# Patient Record
Sex: Female | Born: 1955
Health system: Southern US, Community
[De-identification: ages and names within clinical notes are randomized; demographics above are authoritative.]

## PROBLEM LIST (undated history)

## (undated) DIAGNOSIS — C801 Malignant (primary) neoplasm, unspecified: Secondary | ICD-10-CM

## (undated) DIAGNOSIS — E119 Type 2 diabetes mellitus without complications: Secondary | ICD-10-CM

## (undated) DIAGNOSIS — I10 Essential (primary) hypertension: Secondary | ICD-10-CM

## (undated) HISTORY — DX: Malignant (primary) neoplasm, unspecified: C80.1

## (undated) HISTORY — DX: Essential (primary) hypertension: I10

---

## 2002-06-16 ENCOUNTER — Ambulatory Visit (HOSPITAL_COMMUNITY): Admission: RE | Admit: 2002-06-16 | Discharge: 2002-06-16 | Payer: Self-pay | Admitting: Pulmonary Disease

## 2010-06-25 ENCOUNTER — Emergency Department (HOSPITAL_COMMUNITY): Payer: PRIVATE HEALTH INSURANCE

## 2010-06-25 ENCOUNTER — Emergency Department (HOSPITAL_COMMUNITY)
Admission: EM | Admit: 2010-06-25 | Discharge: 2010-06-25 | Disposition: A | Discharge status: Home / Self Care | Payer: PRIVATE HEALTH INSURANCE | Attending: Emergency Medicine | Admitting: Emergency Medicine

## 2010-06-25 DIAGNOSIS — R079 Chest pain, unspecified: Secondary | ICD-10-CM | POA: Insufficient documentation

## 2010-06-25 DIAGNOSIS — R0602 Shortness of breath: Secondary | ICD-10-CM | POA: Insufficient documentation

## 2010-06-25 DIAGNOSIS — R51 Headache: Secondary | ICD-10-CM | POA: Insufficient documentation

## 2010-06-25 LAB — CBC
Hemoglobin: 14.4 g/dL (ref 12.0–15.0)
MCH: 30.2 pg (ref 26.0–34.0)
MCHC: 35.6 g/dL (ref 30.0–36.0)
MCV: 84.7 fL (ref 78.0–100.0)
Platelets: 240 10*3/uL (ref 150–400)
RBC: 4.77 MIL/uL (ref 3.87–5.11)
RDW: 12.2 % (ref 11.5–15.5)
WBC: 8.2 10*3/uL (ref 4.0–10.5)

## 2010-06-25 LAB — POCT CARDIAC MARKERS
CKMB, poc: <1 ng/mL — ABNORMAL LOW (ref 1.0–8.0)
Myoglobin, poc: 102 ng/mL (ref 12–200)
Troponin i, poc: <0.05 ng/mL (ref 0.00–0.09)

## 2010-06-25 LAB — DIFFERENTIAL
Basophils Absolute: 0 10*3/uL (ref 0.0–0.1)
Basophils Relative: 0 % (ref 0–1)
Eosinophils Absolute: 0.1 10*3/uL (ref 0.0–0.7)
Eosinophils Relative: 1 % (ref 0–5)
Lymphocytes Relative: 31 % (ref 12–46)
Lymphs Abs: 2.5 10*3/uL (ref 0.7–4.0)
Monocytes Absolute: 0.4 10*3/uL (ref 0.1–1.0)
Monocytes Relative: 5 % (ref 3–12)
Neutro Abs: 5.1 10*3/uL (ref 1.7–7.7)
Neutrophils Relative %: 63 % (ref 43–77)

## 2010-06-25 LAB — BASIC METABOLIC PANEL
CO2: 27 mEq/L (ref 19–32)
Calcium: 9.2 mg/dL (ref 8.4–10.5)
Chloride: 99 mEq/L (ref 96–112)
Creatinine, Ser: 0.95 mg/dL (ref 0.4–1.2)
GFR calc Af Amer: >60 mL/min (ref >60)
Glucose, Bld: 118 mg/dL — ABNORMAL HIGH (ref 70–99)
Potassium: 3.1 mEq/L — ABNORMAL LOW (ref 3.5–5.1)
Sodium: 139 mEq/L (ref 135–145)

## 2010-06-28 ENCOUNTER — Ambulatory Visit (INDEPENDENT_AMBULATORY_CARE_PROVIDER_SITE_OTHER): Payer: PRIVATE HEALTH INSURANCE | Admitting: Cardiology

## 2010-06-28 ENCOUNTER — Encounter: Payer: Self-pay | Admitting: Cardiology

## 2010-06-28 VITALS — BP 179/88 | HR 78 | Ht 64.0 in | Wt 154.0 lb

## 2010-06-28 DIAGNOSIS — R0789 Other chest pain: Secondary | ICD-10-CM

## 2010-06-28 NOTE — Patient Instructions (Signed)
**Note De-identified Lisa Thornton Obfuscation** Your physician recommends that you schedule a follow-up appointment in: as needed  

## 2010-06-28 NOTE — Progress Notes (Signed)
   Patient ID: Lisa Thornton, female    DOB: 06-19-1955, 55 y.o.   MRN: [id number]  HPI  Lisa Thornton comes today for E and M of chest pain. Her husband recently left her for her friend. She is devastated, not sleeping and very tense. ataearful in the office. Evaluated in the ED on 06/25/10. Report reviewed. Lab work, EKG, and CXR nonrevealing. Scheduled for followup with [place].  Her CP was stabbing and sharp. At one point went into her left arm. No other associated sxs. Relieved with ASA and Vicodin. No NTG given per patient.  EKG is normal today.    Review of Systems  [all other systems reviewed and are negative      Physical Exam  Constitutional: She is oriented to person, place, and time. She appears well-developed and well-nourished.  HENT:  Head: Normocephalic and atraumatic.       Poor dentition  Neck: Normal range of motion. Neck supple. No JVD present. No tracheal deviation present. No thyromegaly present.  Cardiovascular: Normal rate, regular rhythm, normal heart sounds and intact distal pulses.  Exam reveals no friction rub.   Pulmonary/Chest: Effort normal and breath sounds normal.  Abdominal: Soft. Bowel sounds are normal.  Musculoskeletal: Normal range of motion. She exhibits no edema.  Neurological: She is alert and oriented to person, place, and time.  Skin: Skin is warm and dry.  Psychiatric:       Depressed, tearful

## 2011-01-15 ENCOUNTER — Emergency Department (HOSPITAL_COMMUNITY)
Admission: EM | Admit: 2011-01-15 | Discharge: 2011-01-15 | Disposition: A | Discharge status: Home / Self Care | Payer: PRIVATE HEALTH INSURANCE | Attending: Emergency Medicine | Admitting: Emergency Medicine

## 2011-01-15 ENCOUNTER — Encounter (HOSPITAL_COMMUNITY): Payer: Self-pay | Admitting: *Deleted

## 2011-01-15 DIAGNOSIS — R51 Headache: Secondary | ICD-10-CM | POA: Insufficient documentation

## 2011-01-15 DIAGNOSIS — H1130 Conjunctival hemorrhage, unspecified eye: Secondary | ICD-10-CM | POA: Insufficient documentation

## 2011-01-15 LAB — CBC
MCH: 30.6 pg (ref 26.0–34.0)
MCHC: 35.4 g/dL (ref 30.0–36.0)
MCV: 86.5 fL (ref 78.0–100.0)
Platelets: 246 10*3/uL (ref 150–400)
RBC: 4.51 MIL/uL (ref 3.87–5.11)
RDW: 12.6 % (ref 11.5–15.5)

## 2011-01-15 MED ORDER — FLUORESCEIN SODIUM 1 MG OP STRP
1.0000 | ORAL_STRIP | Freq: Once | OPHTHALMIC | Status: AC
  Administered 2011-01-15: 1 via OPHTHALMIC
  Filled 2011-01-15: qty 1

## 2011-01-15 MED ORDER — TETRACAINE HCL 0.5 % OP SOLN
1.0000 [drp] | Freq: Once | OPHTHALMIC | Status: AC
  Administered 2011-01-15: 2 [drp] via OPHTHALMIC
  Filled 2011-01-15: qty 2

## 2011-01-15 NOTE — ED Notes (Signed)
Pt alert and oriented x 3. Skin warm and dry. Color pink. Breath sounds clear and equal bilaterally. C/o headache. Blood noted in left eye. Moves all extremities.

## 2011-01-15 NOTE — ED Provider Notes (Signed)
History  Scribed for Dr. Rubin Payor, the patient was seen in room APA05. The chart was scribed by Gilman Schmidt. The patients care was started at 1610 CSN: [id number] Arrival date & time: 01/15/2011  3:53 PM  Chief Complaint  Patient presents with  . Headache   HPI Lisa Thornton is a 55 y.o. female who presents to the Emergency Department complaining of headache. Pt states she woke up this morning with a headache and itching in left eye. She later noticed blood to corner of left eye and now states throbbing pain to eye. Reports that he is unable to see well from left eye. Blood pressure level is currently elevated. Denies any other bleeding. There are no other associated symptoms and no other alleviating or aggravating factors.   Past Medical History  Diagnosis Date  . Chest pain     History reviewed. No pertinent past surgical history.  No family history on file.  History  Substance Use Topics  . Smoking status: Never Smoker   . Smokeless tobacco: Never Used  . Alcohol Use: No    OB History    Grav Para Term Preterm Abortions TAB SAB Ect Mult Living                  Review of Systems  Eyes: Positive for pain, redness and itching.  Neurological: Positive for headaches.  All other systems reviewed and are negative.    Allergies  Review of patient's allergies indicates no known allergies.  Home Medications  No current outpatient prescriptions on file.  BP 202/85  Pulse 95  Temp(Src) 98.4 F (36.9 C) (Oral)  Resp 20  Ht 5\' 4"  (1.626 m)  Wt 160 lb (72.576 kg)  BMI 27.46 kg/m2  SpO2 98%  Physical Exam  Constitutional: She is oriented to person, place, and time. She appears well-developed and well-nourished.  Non-toxic appearance. She does not have a sickly appearance.  HENT:  Head: Normocephalic and atraumatic.  Eyes: Conjunctivae, EOM and lids are normal. Pupils are equal, round, and reactive to light. No scleral icterus.       Sub conjunctival hemorrhage to left  eye Left eye tenderness No Petechiae No bruising    Neck: Trachea normal and normal range of motion. Neck supple.  Cardiovascular: Regular rhythm and normal heart sounds.   Pulmonary/Chest: Effort normal and breath sounds normal.  Abdominal: Soft. Normal appearance. There is no tenderness. There is no rebound, no guarding and no CVA tenderness.  Musculoskeletal: Normal range of motion.  Neurological: She is alert and oriented to person, place, and time. She has normal strength.  Skin: Skin is warm, dry and intact. No rash noted.  left eye examined with slit lamp. Subconjunctival hemorrhage on the left. Intraocular pressures were around 19. Normal corneal exam.  ED Course  Procedures  DIAGNOSTIC STUDIES: Oxygen Saturation is 98% on room ain, normal by my interpretation.    COORDINATION OF CARE: 1610:  - Patient evaluated by ED physician, Pontocaine, Fluorescein strips, CBC ordered  Results for orders placed during the hospital encounter of 01/15/11  CBC      Component Value Range   WBC 9.8  4.0 - 10.5 (K/uL)   RBC 4.51  3.87 - 5.11 (MIL/uL)   Hemoglobin 13.8  12.0 - 15.0 (g/dL)   HCT 40.9  81.1 - 91.4 (%)   MCV 86.5  78.0 - 100.0 (fL)   MCH 30.6  26.0 - 34.0 (pg)   MCHC 35.4  30.0 - 36.0 (g/dL)  RDW 12.6  11.5 - 15.5 (%)   Platelets 246  150 - 400 (K/uL)      MDM  The patient presented with a sub-conjunctival hemorrhage on her left eye. It is moderate medially there is some swelling. She is able to close her eye. Intraocular pressures are upper limits of normal. No other bleeding. Her platelets are fine. She has not drank alcohol. She will need ophthalmology followup. her blood pressure has normalized on reexamination.  I personally performed the services described in this documentation, which was scribed in my presence. The recorded information has been reviewed and considered. Juliet Rude. Rubin Payor, MD       Juliet Rude. Rubin Payor, MD 01/15/11 1752

## 2011-01-15 NOTE — ED Notes (Signed)
Pt states she woke up this morning with a headache and itching to her left eye. She later noticed blood to corner of left eye and now states pain to eye, states throbbing pain to eye. Pt states she is unable to see well from left eye.

## 2013-10-04 ENCOUNTER — Encounter (HOSPITAL_COMMUNITY): Payer: Self-pay | Admitting: Emergency Medicine

## 2013-10-04 ENCOUNTER — Emergency Department (HOSPITAL_COMMUNITY)
Admission: EM | Admit: 2013-10-04 | Discharge: 2013-10-04 | Disposition: A | Discharge status: Home / Self Care | Payer: Self-pay | Attending: Emergency Medicine | Admitting: Emergency Medicine

## 2013-10-04 ENCOUNTER — Emergency Department (HOSPITAL_COMMUNITY): Payer: Self-pay

## 2013-10-04 DIAGNOSIS — S92919A Unspecified fracture of unspecified toe(s), initial encounter for closed fracture: Secondary | ICD-10-CM | POA: Insufficient documentation

## 2013-10-04 DIAGNOSIS — W2203XA Walked into furniture, initial encounter: Secondary | ICD-10-CM | POA: Insufficient documentation

## 2013-10-04 DIAGNOSIS — Z79899 Other long term (current) drug therapy: Secondary | ICD-10-CM | POA: Insufficient documentation

## 2013-10-04 DIAGNOSIS — Y92009 Unspecified place in unspecified non-institutional (private) residence as the place of occurrence of the external cause: Secondary | ICD-10-CM | POA: Insufficient documentation

## 2013-10-04 DIAGNOSIS — S92911A Unspecified fracture of right toe(s), initial encounter for closed fracture: Secondary | ICD-10-CM

## 2013-10-04 DIAGNOSIS — Y9389 Activity, other specified: Secondary | ICD-10-CM | POA: Insufficient documentation

## 2013-10-04 MED ORDER — LIDOCAINE HCL (PF) 2 % IJ SOLN
INTRAMUSCULAR | Status: AC
  Filled 2013-10-04: qty 10

## 2013-10-04 MED ORDER — HYDROCODONE-ACETAMINOPHEN 5-325 MG PO TABS
2.0000 | ORAL_TABLET | Freq: Once | ORAL | Status: AC
  Administered 2013-10-04: 2 via ORAL
  Filled 2013-10-04: qty 2

## 2013-10-04 MED ORDER — LIDOCAINE HCL (PF) 2 % IJ SOLN
10.0000 mL | Freq: Once | INTRAMUSCULAR | Status: AC
Start: 2013-10-04 — End: 2013-10-04
  Administered 2013-10-04: 10 mL

## 2013-10-04 MED ORDER — HYDROCODONE-ACETAMINOPHEN 7.5-325 MG PO TABS: 1.0000 | ORAL_TABLET | ORAL | Status: DC | PRN

## 2013-10-04 NOTE — ED Provider Notes (Signed)
CSN: [id number]     Arrival date & time 10/04/13  1649 History   First MD Initiated Contact with Patient 10/04/13 1719     Chief Complaint  Patient presents with  . Toe Injury     (Consider location/radiation/quality/duration/timing/severity/associated sxs/prior Treatment) HPI Comments: Patient is a 58 year old female who presents to the emergency department with complaint of injury to the right fifth toe. The patient states that just prior to admission she hit her right fifth toe on her bed. She noticed severe pain, and noticed deformity of the toe and came to the emergency department for evaluation. The patient denies being on any anticoagulation medications, or having any bleeding type disorders. Patient denies any previous operations or procedures involving the right foot. No other injury reported. The patient has not taken any medication for the toe injury up to this point.  The history is provided by the patient.    Past Medical History  Diagnosis Date  . Chest pain    History reviewed. No pertinent past surgical history. No family history on file. History  Substance Use Topics  . Smoking status: Never Smoker   . Smokeless tobacco: Never Used  . Alcohol Use: No   OB History   Grav Para Term Preterm Abortions TAB SAB Ect Mult Living                 Review of Systems  Constitutional: Negative for activity change.       All ROS Neg except as noted in HPI  HENT: Negative for nosebleeds.   Eyes: Negative for photophobia and discharge.  Respiratory: Negative for cough, shortness of breath and wheezing.   Cardiovascular: Negative for chest pain and palpitations.  Gastrointestinal: Negative for abdominal pain and blood in stool.  Genitourinary: Negative for dysuria, frequency and hematuria.  Musculoskeletal: Negative for arthralgias, back pain and neck pain.  Skin: Negative.   Neurological: Negative for dizziness, seizures and speech difficulty.  Psychiatric/Behavioral:  Negative for hallucinations and confusion.      Allergies  Review of patient's allergies indicates no known allergies.  Home Medications   Prior to Admission medications   Medication Sig Start Date End Date Taking? Authorizing Provider  Multiple Vitamin (MULTIVITAMIN WITH MINERALS) TABS tablet Take 1 tablet by mouth daily.   Yes Historical Provider, MD   BP 206/85  Pulse 97  Temp(Src) 98.5 F (36.9 C) (Oral)  Resp 16  Ht 5\' 4"  (1.626 m)  Wt 178 lb (80.74 kg)  BMI 30.54 kg/m2  SpO2 96% Physical Exam  Nursing note and vitals reviewed. Constitutional: She is oriented to person, place, and time. She appears well-developed and well-nourished.  Non-toxic appearance.  HENT:  Head: Normocephalic.  Right Ear: Tympanic membrane and external ear normal.  Left Ear: Tympanic membrane and external ear normal.  Eyes: EOM and lids are normal. Pupils are equal, round, and reactive to light.  Neck: Normal range of motion. Neck supple. Carotid bruit is not present.  Cardiovascular: Normal rate, regular rhythm, normal heart sounds, intact distal pulses and normal pulses.   Pulmonary/Chest: Breath sounds normal. No respiratory distress.  Abdominal: Soft. Bowel sounds are normal. There is no tenderness. There is no guarding.  Musculoskeletal: Normal range of motion.       Right foot: She exhibits bony tenderness, swelling and deformity.       Feet:  Deformity of the right 5th toe. DP 2+. Cap refill less than 2 sec.Marland Kitchen No lesions between the toes.  Lymphadenopathy:  Head (right side): No submandibular adenopathy present.       Head (left side): No submandibular adenopathy present.    She has no cervical adenopathy.  Neurological: She is alert and oriented to person, place, and time. She has normal strength. No cranial nerve deficit or sensory deficit.  Skin: Skin is warm and dry.  Psychiatric: She has a normal mood and affect. Her speech is normal.    ED Course  Reduction of  fracture Date/Time: 10/04/2013 5:53 PM Performed by: Lenox Ahr Authorized by: Lenox Ahr Consent: Verbal consent obtained. Risks and benefits: risks, benefits and alternatives were discussed Consent given by: patient Patient understanding: patient states understanding of the procedure being performed Patient identity confirmed: arm band Time out: Immediately prior to procedure a "time out" was called to verify the correct patient, procedure, equipment, support staff and site/side marked as required. Preparation: Patient was prepped and draped in the usual sterile fashion. Local anesthesia used: yes Anesthesia: digital block Local anesthetic: lidocaine 2% without epinephrine Patient sedated: no Patient tolerance: Patient tolerated the procedure well with no immediate complications. Comments: Right 5th toe fracture reduced. Buddy tape splint applied. Post reduction xray obtained.   (including critical care time) Labs Review Labs Reviewed - No data to display  Imaging Review Dg Toe 5th Right  10/04/2013   CLINICAL DATA:  Traumatic injury and pain  EXAM: RIGHT FIFTH TOE  COMPARISON:  None.  FINDINGS: There is a transverse fracture through the distal aspect of the fifth proximal phalanx with dorsal displacement of the distal fracture fragment and some mild lateral displacement as well. No other fractures are seen.  IMPRESSION: Fifth proximal phalangeal fracture as described.   Electronically Signed   By: Inez Catalina M.D.   On: 10/04/2013 17:16     EKG Interpretation None      MDM Patient sustained injury to the right fifth toe after hitting it on her bed just prior to admission. The initial x-rays were shared with the patient. The patient gave permission for reduction of the fracture. Fracture care for fracture of the right fifth toe was given. Repeat x-ray reveals some improvement of the fracture. Patient is placed in a buddy tape splint and postoperative shoe. Prescription  for Norco 7.5 one every 4 hours as needed for pain given to the patient. Patient referred to Dr. Paulla Dolly for additional evaluation and management.    Final diagnoses:  None    *I have reviewed nursing notes, vital signs, and all appropriate lab and imaging results for this patient.Lenox Ahr, PA-C 10/04/13 1836

## 2013-10-04 NOTE — Discharge Instructions (Signed)
He displaced fracture of the right fifth toe. It is important that you see Dr. Paulla Dolly, the podiatrist of your choice for additional evaluation and management. Please elevate her right foot above your waist is much as possible. Please apply ice. Please use Tylenol or ibuprofen for mild pain, use Norco for more severe pain. Norco may cause drowsiness, please use with caution. Toe Fracture Your caregiver has diagnosed you as having a fractured toe. A toe fracture is a break in the bone of a toe. "Buddy taping" is a way of splinting your broken toe, by taping the broken toe to the toe next to it. This "buddy taping" will keep the injured toe from moving beyond normal range of motion. Buddy taping also helps the toe heal in a more normal alignment. It may take 6 to 8 weeks for the toe injury to heal. [place] your toes taped together for as long as directed by your caregiver or until you see a doctor for a follow-up examination. You can change the tape after bathing. Always use a small piece of gauze or cotton between the toes when taping them together. This will help the skin stay dry and prevent infection.  Apply ice to the injury for 15-20 minutes each hour while awake for the first 2 days. Put the ice in a plastic bag and place a towel between the bag of ice and your skin.  After the first 2 days, apply heat to the injured area. Use heat for the next 2 to 3 days. Place a heating pad on the foot or soak the foot in warm water as directed by your caregiver.  Keep your foot elevated as much as possible to lessen swelling.  Wear sturdy, supportive shoes. The shoes should not pinch the toes or fit tightly against the toes.  Your caregiver may prescribe a rigid shoe if your foot is very swollen.  Your may be given crutches if the pain is too great and it hurts too much to walk.  Only take over-the-counter or prescription medicines for pain, discomfort, or fever as directed by your  caregiver.  If your caregiver has given you a follow-up appointment, it is very important to keep that appointment. Not keeping the appointment could result in a chronic or permanent injury, pain, and disability. If there is any problem keeping the appointment, you must call back to this facility for assistance. SEEK MEDICAL CARE IF:   You have increased pain or swelling, not relieved with medications.  The pain does not get better after 1 week.  Your injured toe is cold when the others are warm. SEEK IMMEDIATE MEDICAL CARE IF:   The toe becomes cold, numb, or white.  The toe becomes hot (inflamed) and red. Document Released: 03/17/2000 Document Revised: 06/12/2011 Document Reviewed: 11/04/2007 [facility] Patient Information 2015 [place], [place]. This information is not intended to replace advice given to you by your health care provider. Make sure you discuss any questions you have with your health care provider.

## 2013-10-04 NOTE — ED Notes (Signed)
Pt verbalized understanding to use caution and no driving within 4 hours of taking pain med due to med causes drowsiness

## 2013-10-04 NOTE — ED Notes (Signed)
Pt states she hit her right 5th toe on her bed just PTA. Toe is rotated outward. NAD.

## 2013-10-05 NOTE — ED Provider Notes (Signed)
Medical screening examination/treatment/procedure(s) were performed by non-physician practitioner and as supervising physician I was immediately available for consultation/collaboration.   EKG Interpretation None        Fredia Sorrow, MD 10/05/13 [phone]

## 2016-11-16 ENCOUNTER — Emergency Department (HOSPITAL_COMMUNITY)
Admission: EM | Admit: 2016-11-16 | Discharge: 2016-11-16 | Disposition: A | Discharge status: Home / Self Care | Payer: PRIVATE HEALTH INSURANCE | Attending: Emergency Medicine | Admitting: Emergency Medicine

## 2016-11-16 ENCOUNTER — Encounter (HOSPITAL_COMMUNITY): Payer: Self-pay | Admitting: *Deleted

## 2016-11-16 DIAGNOSIS — R21 Rash and other nonspecific skin eruption: Secondary | ICD-10-CM

## 2016-11-16 DIAGNOSIS — T7840XA Allergy, unspecified, initial encounter: Secondary | ICD-10-CM

## 2016-11-16 MED ORDER — FAMOTIDINE 20 MG PO TABS: 20.0000 mg | ORAL_TABLET | Freq: Two times a day (BID) | ORAL | 0 refills | Status: DC

## 2016-11-16 MED ORDER — PREDNISONE 20 MG PO TABS: ORAL_TABLET | ORAL | 0 refills | Status: DC

## 2016-11-16 MED ORDER — DIPHENHYDRAMINE HCL 50 MG/ML IJ SOLN
50.0000 mg | Freq: Once | INTRAMUSCULAR | Status: AC
  Administered 2016-11-16: 50 mg via INTRAMUSCULAR
  Filled 2016-11-16: qty 1

## 2016-11-16 MED ORDER — FAMOTIDINE 20 MG PO TABS
20.0000 mg | ORAL_TABLET | Freq: Once | ORAL | Status: AC
  Administered 2016-11-16: 20 mg via ORAL
  Filled 2016-11-16: qty 1

## 2016-11-16 MED ORDER — METHYLPREDNISOLONE SODIUM SUCC 125 MG IJ SOLR
125.0000 mg | Freq: Once | INTRAMUSCULAR | Status: AC
  Administered 2016-11-16: 125 mg via INTRAMUSCULAR
  Filled 2016-11-16: qty 2

## 2016-11-16 NOTE — Discharge Instructions (Signed)
Stay cool, heat will make the rash and itching worse. Take the medications as prescribed and until gone. You can also take benadryl 50 mg every 6 hrs for itching OR take zyrtec OTC once a day for itching and rash.   Recheck if you get lesions in your mouth, you start having difficulty swallowing or breathing or your rash seems to be getting worse instead of better.

## 2016-11-16 NOTE — ED Triage Notes (Signed)
Pt c/o rash all over her body that started yesterday; pt has flat red spots to back and bilateral legs

## 2016-11-16 NOTE — ED Provider Notes (Signed)
[facility] DEPT Provider Note   CSN: [id number] Arrival date & time: 11/16/16  0004  Time seen 1:25 AM   History   Chief Complaint Chief Complaint  Patient presents with  . Rash    HPI Lisa Thornton is a 61 y.o. female.  HPI  Patient states the evening of August 14 she started has some itching during the night. She states it started on her back and now it has spread to her trunk and extremities. She states the day before at least 24 hours before she had used a new bubble bath that she's never used before. Other than that she denies any other new exposures or new foods. She denies having any difficulty breathing or difficulty swallowing. She denies any swelling of her lips. She states she's never had this before.she denies any fever.  PCP none  Past Medical History:  Diagnosis Date  . Chest pain     There are no active problems to display for this patient.   History reviewed. No pertinent surgical history.  OB History    No data available       Home Medications    Prior to Admission medications   Medication Sig Start Date End Date Taking? Authorizing Provider  famotidine (PEPCID) 20 MG tablet Take 1 tablet (20 mg total) by mouth 2 (two) times daily. 11/16/16   Rolland Porter, MD  predniSONE (DELTASONE) 20 MG tablet Take 3 po QD x 3d , then 2 po QD x 3d then 1 po QD x 3d 11/16/16   Rolland Porter, MD    Family History History reviewed. No pertinent family history.  Social History Social History  Substance Use Topics  . Smoking status: Never Smoker  . Smokeless tobacco: Never Used  . Alcohol use No  unemployed widow   Allergies   Patient has no known allergies.   Review of Systems Review of Systems  All other systems reviewed and are negative.    Physical Exam Updated Vital Signs BP (!) 213/99   Pulse 99   Temp 98.2 F (36.8 C) (Oral)   Resp 16   Ht 5\' 4"  (1.626 m)   Wt 79.4 kg (175 lb)   SpO2 99%   BMI 30.04 kg/m   Physical Exam    Constitutional: She is oriented to person, place, and time. She appears well-developed and well-nourished.  Non-toxic appearance. She does not appear ill. No distress.  HENT:  Head: Normocephalic and atraumatic.  Right Ear: External ear normal.  Left Ear: External ear normal.  Nose: Nose normal. No mucosal edema or rhinorrhea.  Mouth/Throat: Oropharynx is clear and moist and mucous membranes are normal. No dental abscesses or uvula swelling.  Eyes: Pupils are equal, round, and reactive to light. Conjunctivae and EOM are normal.  Neck: Normal range of motion and full passive range of motion without pain. Neck supple.  Cardiovascular: Normal rate, regular rhythm and normal heart sounds.  Exam reveals no gallop and no friction rub.   No murmur heard. Pulmonary/Chest: Effort normal and breath sounds normal. No respiratory distress. She has no wheezes. She has no rhonchi. She has no rales. She exhibits no tenderness and no crepitus.  Abdominal: Normal appearance.  Musculoskeletal: Normal range of motion. She exhibits no edema or tenderness.  Moves all extremities well.   Neurological: She is alert and oriented to person, place, and time. She has normal strength. No cranial nerve deficit.  Skin: Skin is warm, dry and intact. Rash noted. No erythema.  No pallor.  Patient has scattered, red, papular rash noted on her back, and her extremities but no lesions on her face. She has a lot of lesions around her neck that are coalescing. When I first looked at her back and thought maybe this was pityriasis rosea however it does not have a distinct Christmas tree pattern.  Psychiatric: She has a normal mood and affect. Her speech is normal and behavior is normal. Her mood appears not anxious.  Nursing note and vitals reviewed.         ED Treatments / Results  Labs (all labs ordered are listed, but only abnormal results are displayed) Labs Reviewed - No data to display  EKG  EKG  Interpretation None       Radiology No results found.  Procedures Procedures (including critical care time)  Medications Ordered in ED Medications  diphenhydrAMINE (BENADRYL) injection 50 mg (50 mg Intramuscular Given 11/16/16 0245)  methylPREDNISolone sodium succinate (SOLU-MEDROL) 125 mg/2 mL injection 125 mg (125 mg Intramuscular Given 11/16/16 0245)  famotidine (PEPCID) tablet 20 mg (20 mg Oral Given 11/16/16 0245)     Initial Impression / Assessment and Plan / ED Course  I have reviewed the triage vital signs and the nursing notes.  Pertinent labs & imaging results that were available during my care of the patient were reviewed by me and considered in my medical decision making (see chart for details).     Patient was given medication for allergic reaction. This could be pityriasis rosea however I suspect he notes more of an allergic reaction. It possibly is from the bubble bath however I would've thought that would've happened the first night when she used it rather than 24 hours later.  Recheck 3:45 AM. Her rash is much improved. There is much less redness around the raised lesions. And she appears to have fewer lesions. She states the itching is gone. She feels ready to be discharged.  Final Clinical Impressions(s) / ED Diagnoses   Final diagnoses:  Rash  Allergic reaction, initial encounter    New Prescriptions New Prescriptions   FAMOTIDINE (PEPCID) 20 MG TABLET    Take 1 tablet (20 mg total) by mouth 2 (two) times daily.   PREDNISONE (DELTASONE) 20 MG TABLET    Take 3 po QD x 3d , then 2 po QD x 3d then 1 po QD x 3d    Plan discharge  Rolland Porter, MD, Barbette Or, MD 11/16/16 [phone]

## 2017-05-17 ENCOUNTER — Other Ambulatory Visit (HOSPITAL_COMMUNITY): Payer: Self-pay | Admitting: *Deleted

## 2017-05-17 DIAGNOSIS — R229 Localized swelling, mass and lump, unspecified: Principal | ICD-10-CM

## 2017-05-17 DIAGNOSIS — IMO0002 Reserved for concepts with insufficient information to code with codable children: Secondary | ICD-10-CM

## 2017-05-29 ENCOUNTER — Other Ambulatory Visit (HOSPITAL_COMMUNITY): Payer: Self-pay | Admitting: *Deleted

## 2017-05-29 ENCOUNTER — Ambulatory Visit (HOSPITAL_COMMUNITY)
Admission: RE | Admit: 2017-05-29 | Discharge: 2017-05-29 | Disposition: A | Discharge status: Home / Self Care | Payer: PRIVATE HEALTH INSURANCE | Source: Ambulatory Visit | Attending: *Deleted | Admitting: *Deleted

## 2017-05-29 ENCOUNTER — Encounter (HOSPITAL_COMMUNITY): Payer: Self-pay

## 2017-05-29 DIAGNOSIS — R229 Localized swelling, mass and lump, unspecified: Secondary | ICD-10-CM

## 2017-05-29 DIAGNOSIS — IMO0002 Reserved for concepts with insufficient information to code with codable children: Secondary | ICD-10-CM

## 2017-05-29 DIAGNOSIS — N6321 Unspecified lump in the left breast, upper outer quadrant: Secondary | ICD-10-CM | POA: Insufficient documentation

## 2017-05-30 ENCOUNTER — Other Ambulatory Visit (HOSPITAL_COMMUNITY): Payer: Self-pay | Admitting: *Deleted

## 2017-05-30 DIAGNOSIS — R229 Localized swelling, mass and lump, unspecified: Principal | ICD-10-CM

## 2017-05-30 DIAGNOSIS — IMO0002 Reserved for concepts with insufficient information to code with codable children: Secondary | ICD-10-CM

## 2017-06-07 ENCOUNTER — Ambulatory Visit: Payer: PRIVATE HEALTH INSURANCE | Admitting: General Surgery

## 2017-06-12 ENCOUNTER — Encounter (HOSPITAL_COMMUNITY): Payer: Self-pay

## 2017-06-12 ENCOUNTER — Other Ambulatory Visit (HOSPITAL_COMMUNITY): Payer: Self-pay | Admitting: *Deleted

## 2017-06-12 ENCOUNTER — Ambulatory Visit (HOSPITAL_COMMUNITY)
Admission: RE | Admit: 2017-06-12 | Discharge: 2017-06-12 | Disposition: A | Discharge status: Home / Self Care | Payer: Medicaid Other | Source: Ambulatory Visit | Attending: *Deleted | Admitting: *Deleted

## 2017-06-12 DIAGNOSIS — R928 Other abnormal and inconclusive findings on diagnostic imaging of breast: Secondary | ICD-10-CM

## 2017-06-12 DIAGNOSIS — C50412 Malignant neoplasm of upper-outer quadrant of left female breast: Secondary | ICD-10-CM | POA: Insufficient documentation

## 2017-06-12 DIAGNOSIS — R229 Localized swelling, mass and lump, unspecified: Secondary | ICD-10-CM

## 2017-06-12 DIAGNOSIS — R599 Enlarged lymph nodes, unspecified: Secondary | ICD-10-CM | POA: Diagnosis present

## 2017-06-12 DIAGNOSIS — IMO0002 Reserved for concepts with insufficient information to code with codable children: Secondary | ICD-10-CM

## 2017-06-12 DIAGNOSIS — N6320 Unspecified lump in the left breast, unspecified quadrant: Secondary | ICD-10-CM | POA: Diagnosis present

## 2017-06-12 MED ORDER — LIDOCAINE-EPINEPHRINE (PF) 1 %-1:200000 IJ SOLN
INTRAMUSCULAR | Status: AC
  Filled 2017-06-12: qty 30

## 2017-06-12 MED ORDER — LIDOCAINE HCL (PF) 1 % IJ SOLN
INTRAMUSCULAR | Status: AC
  Administered 2017-06-12: 10 mL
  Filled 2017-06-12: qty 10

## 2017-06-19 ENCOUNTER — Ambulatory Visit (INDEPENDENT_AMBULATORY_CARE_PROVIDER_SITE_OTHER): Payer: Self-pay | Admitting: General Surgery

## 2017-06-19 ENCOUNTER — Encounter: Payer: Self-pay | Admitting: General Surgery

## 2017-06-19 VITALS — BP 186/91 | HR 79 | Temp 98.2°F | Ht 64.0 in | Wt 168.0 lb

## 2017-06-19 DIAGNOSIS — C50112 Malignant neoplasm of central portion of left female breast: Secondary | ICD-10-CM

## 2017-06-19 NOTE — Patient Instructions (Addendum)
Implanted Port Home Guide An implanted port is a type of central line that is placed under the skin. Central lines are used to provide IV access when treatment or nutrition needs to be given through a person's veins. Implanted ports are used for long-term IV access. An implanted port may be placed because:  You need IV medicine that would be irritating to the small veins in your hands or arms.  You need long-term IV medicines, such as antibiotics.  You need IV nutrition for a long period.  You need frequent blood draws for lab tests.  You need dialysis. Implanted ports are usually placed in the chest area, but they can also be placed in the upper arm, the abdomen, or the leg. An implanted port has two main parts:  Reservoir. The reservoir is round and will appear as a small, raised area under your skin. The reservoir is the part where a needle is inserted to give medicines or draw blood.  Catheter. The catheter is a thin, flexible tube that extends from the reservoir. The catheter is placed into a large vein. Medicine that is inserted into the reservoir goes into the catheter and then into the vein. How will I care for my incision site? Do not get the incision site wet. Bathe or shower as directed by your health care provider. How is my port accessed? Special steps must be taken to access the port:  Before the port is accessed, a numbing cream can be placed on the skin. This helps numb the skin over the port site.  Your health care provider uses a sterile technique to access the port.  Your health care provider must put on a mask and sterile gloves.  The skin over your port is cleaned carefully with an antiseptic and allowed to dry.  The port is gently pinched between sterile gloves, and a needle is inserted into the port.  Only "non-coring" port needles should be used to access the port. Once the port is accessed, a blood return should be checked. This helps ensure that the port is  in the vein and is not clogged.  If your port needs to remain accessed for a constant infusion, a clear (transparent) bandage will be placed over the needle site. The bandage and needle will need to be changed every week, or as directed by your health care provider.  Keep the bandage covering the needle clean and dry. Do not get it wet. Follow your health care provider's instructions on how to take a shower or bath while the port is accessed.  If your port does not need to stay accessed, no bandage is needed over the port. What is flushing? Flushing helps keep the port from getting clogged. Follow your health care provider's instructions on how and when to flush the port. Ports are usually flushed with saline solution or a medicine called heparin. The need for flushing will depend on how the port is used.  If the port is used for intermittent medicines or blood draws, the port will need to be flushed:  After medicines have been given.  After blood has been drawn.  As part of routine maintenance.  If a constant infusion is running, the port may not need to be flushed. How long will my port stay implanted? The port can stay in for as long as your health care provider thinks it is needed. When it is time for the port to come out, surgery will be done to remove it.   done to remove it. The procedure is similar to the one performed when the port was put in. When should I seek immediate medical care? When you have an implanted port, you should seek immediate medical care if:  You notice a bad smell coming from the incision site.  You have swelling, redness, or drainage at the incision site.  You have more swelling or pain at the port site or the surrounding area.  You have a fever that is not controlled with medicine.  This information is not intended to replace advice given to you by your health care provider. Make sure you discuss any questions you have with your health care provider. Document  Released: 03/20/2005 Document Revised: 08/26/2015 Document Reviewed: 11/25/2012 Elsevier Interactive Patient Education  2017 [place], Female Breast cancer is an abnormal growth of tissue (tumor) in the breast that is cancerous (malignant). Unlike noncancerous (benign) tumors, malignant tumors can spread to other parts of your body. The most common type of female breast cancer begins in the milk ducts (ductal carcinoma). Breast cancer is one of the most common types of cancer in women. What are the causes? The exact cause of female breast cancer is unknown. What increases the risk?  Age older than 18 years.  Family history of breast cancer.  Having the BRCA1 and BRCA2 genes.  Personal history of radiation exposure.  Obesity.  Menstrual periods that begin before age 69 years.  Menopause that begins after age 61 years.  Pregnant for the first time at the age of 73 years or older.  Using hormone therapy.  Drinking more than one alcoholic drink per day. What are the signs or symptoms?  A painless lump in your breast.  Changes in the size or shape of your breast.  Breast skin changes, such as puckering or dimpling.  Nipple abnormalities, such as scaling, crustiness, redness, or pulling in (retraction).  Nipple discharge that is bloody or clear. How is this diagnosed? Your health care provider will ask about your medical history. He or she may also perform a number of procedures, such as:  A physical exam. This will involve feeling the tissue around the breast and under the arms.  Taking a sample of nipple discharge. The sample will be examined under a microscope.  Breast X-rays (mammogram), breast ultrasound exams, or an MRI.  Taking a tissue sample (biopsy) from the breast. The sample will be examined under a microscope to look for cancer cells.  Your cancer will be staged to determine its severity and extent. Staging is a careful attempt to find out  the size of the tumor, whether the cancer has spread, and if so, to what parts of the body. You may need to have more tests to determine the stage of your cancer:  Stage 0-The tumor has not spread to other breast tissue.  Stage I-The cancer is only found in the breast. The tumor may be up to  in (2 cm) wide.  Stage II-The cancer has spread to nearby lymph nodes. The tumor may be up to 2 in (5 cm) wide.  Stage III-The cancer has spread to more distant lymph nodes. The tumor may be larger than 2 in (5 cm) wide.  Stage IV-The cancer has spread to other parts of the body, such as the bones, brain, liver, or lungs.  How is this treated? Depending on the type and stage, female breast cancer may be treated with one or more of the following therapies:  Surgery to remove just the tumor (lumpectomy) or the entire breast (mastectomy). Lymph nodes may also be removed.  Radiation therapy, which uses high-energy rays to kill cancer cells.  Chemotherapy, which is the use of drugs to kill cancer cells.  Hormone therapy, which involves taking medicine to adjust the hormone levels in your body. You may take medicine to decrease your estrogen levels. This can help stop cancer cells from growing.  Follow these instructions at home:  Take medicines only as directed by your health care provider.  Maintain a healthy diet.  Consider joining a support group. This may help you learn to cope with the stress of having breast cancer.  Keep all follow-up appointments as directed by your health care provider. Contact a health care provider if:  You have a sudden increase in pain.  You notice a new lump in either breast or under your arm.  You develop swelling in either arm or hand.  You lose weight without trying.  You have a fever.  You notice new fatigue or weakness. Get help right away if:  You have chest pain or trouble breathing.  You faint. This information is not intended to replace advice  given to you by your health care provider. Make sure you discuss any questions you have with your health care provider. Document Released: 06/28/2005 Document Revised: 07/29/2015 Document Reviewed: 05/14/2013 Elsevier Interactive Patient Education  2017 [employer].

## 2017-06-19 NOTE — Progress Notes (Signed)
Lisa Thornton; [id number]; 09-22-1955   HPI Patient is a 62 year old white female who was referred to my care by the Health Department for evaluation and treatment of the left breast mass.  She under went recent core biopsy of the left breast which revealed poorly differentiated adenocarcinoma.  She is ER positive, PR negative, HER-2 negative.  She also had a biopsy of a left axillary lymph node which was negative.  Patient states her mother was treated for breast cancer in her 79s.  She noticed the mass in the left breast in December 2018, and has noted a significant increase in size over that time.  She denies any nipple discharge or breast pain. Past Medical History:  Diagnosis Date  . Chest pain     History reviewed. No pertinent surgical history.  Family History  Problem Relation Age of Onset  . Breast cancer Mother     Current Outpatient Medications on File Prior to Visit  Medication Sig Dispense Refill  . famotidine (PEPCID) 20 MG tablet Take 1 tablet (20 mg total) by mouth 2 (two) times daily. 20 tablet 0  . predniSONE (DELTASONE) 20 MG tablet Take 3 po QD x 3d , then 2 po QD x 3d then 1 po QD x 3d 18 tablet 0   No current facility-administered medications on file prior to visit.     No Known Allergies  Social History   Substance and Sexual Activity  Alcohol Use No    Social History   Tobacco Use  Smoking Status Never Smoker  Smokeless Tobacco Never Used    Review of Systems  Constitutional: Negative.   HENT: Negative.   Eyes: Negative.   Respiratory: Negative.   Cardiovascular: Negative.   Gastrointestinal: Positive for heartburn.  Genitourinary: Negative.   Musculoskeletal: Negative.   Skin: Negative.   Neurological: Negative.   Endo/Heme/Allergies: Negative.   Psychiatric/Behavioral: Negative.     Objective   Vitals:   06/19/17 1406  BP: (!) 186/91  Pulse: 79  Temp: 98.2 F (36.8 C)    Physical Exam  Constitutional: She is oriented to person,  place, and time and well-developed, well-nourished, and in no distress.  HENT:  Head: Normocephalic and atraumatic.  Neck: Normal range of motion. Neck supple.  Cardiovascular: Normal rate, regular rhythm and normal heart sounds. Exam reveals no gallop and no friction rub.  No murmur heard. Pulmonary/Chest: Effort normal and breath sounds normal. No respiratory distress. She has no wheezes. She has no rales.  Lymphadenopathy:    She has no cervical adenopathy.  Neurological: She is alert and oriented to person, place, and time.  Skin: Skin is warm and dry.  Vitals reviewed. Breast: Large 5 cm mass involving the left areola and 3 o'clock position of the left breast with thin erythematous skin overlying it.  I do not palpate any significant lymphadenopathy in the left breast.  Some nipple retraction noted.  Right breast examination reveals no dominant mass, nipple discharge, dimpling.  The axilla is negative for palpable nodes.  Mammogram, ultrasound, and pathology reports all reviewed  Assessment  Invasive ductal carcinoma of left breast Plan   Given the size of the left breast cancer in relation to her breast, I have contacted oncology to see whether she is a candidate for neoadjuvant therapy.  I told the patient that ultimately she will probably require a left modified radical mastectomy.  Literature was given concerning Port-A-Cath placement should that be needed.  Further management is pending oncology evaluation.  Patient is fully aware of the diagnosis.

## 2017-06-22 ENCOUNTER — Other Ambulatory Visit: Payer: Self-pay

## 2017-06-22 ENCOUNTER — Inpatient Hospital Stay (HOSPITAL_COMMUNITY): Payer: Medicaid Other | Attending: Hematology | Admitting: Hematology

## 2017-06-22 ENCOUNTER — Encounter (HOSPITAL_COMMUNITY): Payer: Self-pay | Admitting: Hematology

## 2017-06-22 ENCOUNTER — Inpatient Hospital Stay (HOSPITAL_COMMUNITY): Payer: Medicaid Other

## 2017-06-22 DIAGNOSIS — Z170 Estrogen receptor positive status [ER+]: Secondary | ICD-10-CM | POA: Diagnosis not present

## 2017-06-22 DIAGNOSIS — C50412 Malignant neoplasm of upper-outer quadrant of left female breast: Secondary | ICD-10-CM | POA: Insufficient documentation

## 2017-06-22 DIAGNOSIS — Z803 Family history of malignant neoplasm of breast: Secondary | ICD-10-CM

## 2017-06-22 DIAGNOSIS — I10 Essential (primary) hypertension: Secondary | ICD-10-CM | POA: Insufficient documentation

## 2017-06-22 LAB — CBC WITH DIFFERENTIAL/PLATELET
Basophils Absolute: 0 10*3/uL (ref 0.0–0.1)
Basophils Relative: 0 %
Eosinophils Absolute: 0.1 10*3/uL (ref 0.0–0.7)
Eosinophils Relative: 1 %
HEMATOCRIT: 40.1 % (ref 36.0–46.0)
HEMOGLOBIN: 13.6 g/dL (ref 12.0–15.0)
LYMPHS ABS: 2.9 10*3/uL (ref 0.7–4.0)
Lymphocytes Relative: 26 %
MCH: 30 pg (ref 26.0–34.0)
MCHC: 33.9 g/dL (ref 30.0–36.0)
MCV: 88.3 fL (ref 78.0–100.0)
MONOS PCT: 6 %
Monocytes Absolute: 0.6 10*3/uL (ref 0.1–1.0)
NEUTROS PCT: 67 %
Neutro Abs: 7.4 10*3/uL (ref 1.7–7.7)
Platelets: 268 10*3/uL (ref 150–400)
RBC: 4.54 MIL/uL (ref 3.87–5.11)
RDW: 12.3 % (ref 11.5–15.5)
WBC: 11.1 10*3/uL — AB (ref 4.0–10.5)

## 2017-06-22 LAB — COMPREHENSIVE METABOLIC PANEL
ALK PHOS: 60 U/L (ref 38–126)
ALT: 20 U/L (ref 14–54)
ANION GAP: 12 (ref 5–15)
AST: 24 U/L (ref 15–41)
Albumin: 4.1 g/dL (ref 3.5–5.0)
BILIRUBIN TOTAL: 0.5 mg/dL (ref 0.3–1.2)
BUN: 15 mg/dL (ref 6–20)
CALCIUM: 9.8 mg/dL (ref 8.9–10.3)
CO2: 26 mmol/L (ref 22–32)
CREATININE: 0.91 mg/dL (ref 0.44–1.00)
Chloride: 99 mmol/L — ABNORMAL LOW (ref 101–111)
GFR calc non Af Amer: >60 mL/min (ref >60)
Glucose, Bld: 134 mg/dL — ABNORMAL HIGH (ref 65–99)
Potassium: 4.3 mmol/L (ref 3.5–5.1)
SODIUM: 137 mmol/L (ref 135–145)
TOTAL PROTEIN: 8.7 g/dL — AB (ref 6.5–8.1)

## 2017-06-22 NOTE — Assessment & Plan Note (Signed)
1.  Poorly differentiated left breast cancer: I have discussed the findings on the mammogram and pathology report with the patient and his sister in detail.  Clinically she had a left breast mass which measures about 5-6 cm.  There is mild erythema with mild redness, but no orange peel appearance.  She noticed a lump in December 2018.  Clinical history and examination does not fit the criteria for inflammatory breast cancer.  One subcentimeter lymph node is palpable under the left axilla.  Pathology shows poorly differentiated carcinoma with sarcomatoid features, ER positive, PR negative and HER-2 negative.  Given her small breast size, and aggressive histology, she would be a good candidate for neoadjuvant chemotherapy.  I will order a CT scan of her chest with contrast for staging purposes.  We will also obtain conference of metabolic panel today.  If alkaline phosphatase is elevated, we will consider doing bone scan.  I will obtain a 2D echocardiogram to evaluate her LVEF.  She will need a port placement for chemotherapy administration.  If her heart function allows, she will be considered for anthracycline based chemotherapy.  I will see her back in 1 week to discuss chemotherapy options.  2.  Newly diagnosed hypertension: She is taking metoprolol 50 mg daily.  Her blood pressure is slightly elevated due to anxiety in our office today.

## 2017-06-22 NOTE — Progress Notes (Signed)
Patient is calm, supported by family, lives with Nephew and his family.

## 2017-06-22 NOTE — Patient Instructions (Signed)
[facility] at [facility] Discharge Instructions  Today you saw Dr. K.   Thank you for choosing [facility] at [facility] to provide your oncology and hematology care.  To afford each patient quality time with our provider, please arrive at least 15 minutes before your scheduled appointment time.   If you have a lab appointment with the Cancer Center please come in thru the  Main Entrance and check in at the main information desk  You need to re-schedule your appointment should you arrive 10 or more minutes late.  We strive to give you quality time with our providers, and arriving late affects you and other patients whose appointments are after yours.  Also, if you no show three or more times for appointments you may be dismissed from the clinic at the providers discretion.     Again, thank you for choosing [facility].  Our hope is that these requests will decrease the amount of time that you wait before being seen by our physicians.       _____________________________________________________________  Should you have questions after your visit to  Cancer Center, please contact our office at [phone] between the hours of 8:30 a.m. and 4:30 p.m.  Voicemails left after 4:30 p.m. will not be returned until the following business day.  For prescription refill requests, have your pharmacy contact our office.       Resources For Cancer Patients and their Caregivers ? American Cancer Society: Can assist with transportation, wigs, general needs, runs Look Good Feel Better.        1-888-227-6333 ? Cancer Care: Provides financial assistance, online support groups, medication/co-pay assistance.  1-800-813-HOPE (4673) ? [facility] Assists [place] cancer patients and their families through emotional , educational and financial support.  336-427-4357 ? [place] DSS Where to apply for food  stamps, Medicaid and utility assistance. 336-342-1394 ? [facility]: Transportation to medical appointments. 336-347-2287 ? Social Security Administration: May apply for disability if have a Stage IV cancer. 336-342-7796 1-800-772-1213 ? [place] Aging, Disability and Transit Services: Assists with nutrition, care and transit needs. 336-349-2343  Cancer Center Support Programs:   > Cancer Support Group  2nd Tuesday of the month 1pm-2pm, Journey Room   > Creative Journey  3rd Tuesday of the month 1130am-1pm, Journey Room    

## 2017-06-22 NOTE — Progress Notes (Signed)
[facility] CONSULT NOTE  Patient Care Team: Default, Provider, MD as PCP - General  CHIEF COMPLAINTS/PURPOSE OF CONSULTATION:  Newly diagnosed Left breast cancer  HISTORY OF PRESENTING ILLNESS:  Lisa Thornton 62 y.o. female is seen in consultation today for newly diagnosed left breast cancer.  Patient reports that she felt a lump in her left breast around the time of December.  She contacted health department, and a mammogram and ultrasound done on 05/29/2017 showed a 4.1 cm 2:00 periareolar mass suspicious for malignancy, with 2 left axillary lymph nodes with mild eccentric cortical thickening.  No evidence of maintenance in the right breast.  She underwent biopsy of the left breast on 06/12/2017, showing invasive poorly differentiated carcinoma with sarcomatoid changes, ER positive, PR negative and HER-2 negative.  Ki-67 was 80%.  Lymph node core needle biopsy from the left axilla was negative for carcinoma.  She denied any previous breast biopsies.  She denies any new onset pains.  She has mild soreness since the biopsy in the left breast.  She noticed slight increase in size also.  She lives at her nephew's home and is independent of ADLs and IADLs.  She was recently diagnosed with hypertension and started on a pill for it.  She denies any other past medical problems, but she does not have a PMD.  I reviewed her records extensively and collaborated the history with the patient.  SUMMARY OF ONCOLOGIC HISTORY:   Breast cancer of upper-outer quadrant of left female breast ([place])   06/22/2017 Initial Diagnosis    Breast cancer of upper-outer quadrant of left female breast ([place])      06/22/2017 Cancer Staging    Staging form: Breast, AJCC 8th Edition - Clinical: Stage IIB (cT2, cN0, cM0, G3, ER+, PR-, HER2-) - Signed by Derek Jack, MD on 06/22/2017       In terms of breast cancer risk profile:  She menarched at early age of 27 and went to menopause at age 73 She had  2 pregnancies, her first child was born at age 17 She did received birth control pills for approximately 2 years.  She never received hormone replacement therapy.  Her mother was diagnosed with breast cancer in her 33s.  Sister had a lumpectomy for unknown reason.  Paternal grandmother and paternal aunt had some type of cancers, patient not aware of type.  MEDICAL HISTORY:  Past Medical History:  Diagnosis Date  . Chest pain   . Hypertension     SURGICAL HISTORY: History reviewed. No pertinent surgical history.  SOCIAL HISTORY: Social History   Socioeconomic History  . Marital status: Divorced    Spouse name: Not on file  . Number of children: Not on file  . Years of education: Not on file  . Highest education level: Not on file  Occupational History  . Not on file  Social Needs  . Financial resource strain: Somewhat hard  . Food insecurity:    Worry: Never true    Inability: Never true  . Transportation needs:    Medical: No    Non-medical: No  Tobacco Use  . Smoking status: Never Smoker  . Smokeless tobacco: Never Used  Substance and Sexual Activity  . Alcohol use: No  . Drug use: No  . Sexual activity: Not Currently  Lifestyle  . Physical activity:    Days per week: 3 days    Minutes per session: 10 min  . Stress: Very much  Relationships  . Social  connections:    Talks on phone: More than three times a week    Gets together: More than three times a week    Attends religious service: More than 4 times per year    Active member of club or organization: Yes    Attends meetings of clubs or organizations: Never    Relationship status: Divorced  . Intimate partner violence:    Fear of current or ex partner: Patient refused    Emotionally abused: Patient refused    Physically abused: Patient refused    Forced sexual activity: Patient refused  Other Topics Concern  . Not on file  Social History Narrative  . Not on file    FAMILY HISTORY: Family History   Problem Relation Age of Onset  . Breast cancer Mother   . Thyroid disease Mother   . Heart disease Mother   . Heart attack Father   . Heart attack Sister   . Hypertension Sister   . Heart attack Brother   . Cancer Sister   . Stroke Brother   . Alzheimer's disease Maternal Aunt     ALLERGIES:  has No Known Allergies.  MEDICATIONS:  Current Outpatient Medications  Medication Sig Dispense Refill  . metoprolol tartrate (LOPRESSOR) 50 MG tablet Take 50 mg by mouth 2 (two) times daily.     No current facility-administered medications for this visit.     REVIEW OF SYSTEMS:   Constitutional: Denies fevers, chills or abnormal night sweats Eyes: Denies blurriness of vision, double vision or watery eyes Ears, nose, mouth, throat, and face: Denies mucositis or sore throat Respiratory: Denies cough, dyspnea or wheezes Cardiovascular: Denies palpitation, chest discomfort or lower extremity swelling Gastrointestinal:  Denies nausea, heartburn or change in bowel habits Skin: Denies abnormal skin rashes Lymphatics: Denies new lymphadenopathy or easy bruising Neurological:Denies numbness, tingling or new weaknesses Behavioral/Psych: Mood is stable, no new changes  Breast:  Denies any palpable lumps or discharge All other systems were reviewed with the patient and are negative.  PHYSICAL EXAMINATION: ECOG PERFORMANCE STATUS: 1 - Symptomatic but completely ambulatory  Vitals:   06/22/17 0850  BP: (!) 188/90  Pulse: 79  Resp: 20  Temp: 98.4 F (36.9 C)  SpO2: 98%   Filed Weights   06/22/17 0850  Weight: 169 lb 4.8 oz (76.8 kg)    GENERAL:alert, no distress and comfortable SKIN: skin color, texture, turgor are normal, no rashes or significant lesions EYES: normal, conjunctiva are pink and non-injected, sclera clear OROPHARYNX: No oropharyngeal lesions.  Poor dentition. NECK: supple, thyroid normal size, non-tender, without nodularity LYMPH:  no palpable lymphadenopathy in the  cervical or inguinal LUNGS: clear to auscultation and percussion with normal breathing effort HEART: regular rate & rhythm and no murmurs and no lower extremity edema ABDOMEN:abdomen soft, non-tender and normal bowel sounds Musculoskeletal:no cyanosis of digits and no clubbing  PSYCH: alert & oriented x 3 with fluent speech NEURO: no focal motor/sensory deficits BREAST: Left breast has a 5 cm mass palpable at 3 o'clock position, going towards the left area [place], freely mobile, slight warmth with mild erythema.  No orange peel appearance.  No ulceration.  There is a subcentimeter lymph node palpable in the left axilla.  Right breast has no palpable masses.  No axillary adenopathy.  (exam performed in the presence of a chaperone)   LABORATORY DATA:  I have reviewed the data as listed Lab Results  Component Value Date   WBC 9.8 01/15/2011   HGB 13.8 01/15/2011  HCT 39.0 01/15/2011   MCV 86.5 01/15/2011   PLT 246 01/15/2011   Lab Results  Component Value Date   NA 139 06/25/2010   K 3.1 (L) 06/25/2010   CL 99 06/25/2010   CO2 27 06/25/2010    RADIOGRAPHIC STUDIES: I have independently reviewed her mammogram and agree with the report.  ASSESSMENT AND PLAN:  Breast cancer of upper-outer quadrant of left female breast ([place]) 1.  Poorly differentiated left breast cancer: I have discussed the findings on the mammogram and pathology report with the patient and his sister in detail.  Clinically she had a left breast mass which measures about 5-6 cm.  There is mild erythema with mild redness, but no orange peel appearance.  She noticed a lump in December 2018.  Clinical history and examination does not fit the criteria for inflammatory breast cancer.  One subcentimeter lymph node is palpable under the left axilla.  Pathology shows poorly differentiated carcinoma with sarcomatoid features, ER positive, PR negative and HER-2 negative.  Given her small breast size, and aggressive histology, she would  be a good candidate for neoadjuvant chemotherapy.  I will order a CT scan of her chest with contrast for staging purposes.  We will also obtain conference of metabolic panel today.  If alkaline phosphatase is elevated, we will consider doing bone scan.  I will obtain a 2D echocardiogram to evaluate her LVEF.  She will need a port placement for chemotherapy administration.  If her heart function allows, she will be considered for anthracycline based chemotherapy.  I will see her back in 1 week to discuss chemotherapy options.  2.  Newly diagnosed hypertension: She is taking metoprolol 50 mg daily.  Her blood pressure is slightly elevated due to anxiety in our office today.    All questions were answered. The patient knows to call the clinic with any problems, questions or concerns.    Derek Jack, MD 06/22/17

## 2017-06-25 ENCOUNTER — Ambulatory Visit (HOSPITAL_COMMUNITY)
Admission: RE | Admit: 2017-06-25 | Discharge: 2017-06-25 | Disposition: A | Discharge status: Home / Self Care | Payer: Medicaid Other | Source: Ambulatory Visit | Attending: Hematology | Admitting: Hematology

## 2017-06-25 DIAGNOSIS — C50412 Malignant neoplasm of upper-outer quadrant of left female breast: Secondary | ICD-10-CM | POA: Insufficient documentation

## 2017-06-25 DIAGNOSIS — Z170 Estrogen receptor positive status [ER+]: Secondary | ICD-10-CM | POA: Insufficient documentation

## 2017-06-25 DIAGNOSIS — I10 Essential (primary) hypertension: Secondary | ICD-10-CM | POA: Diagnosis not present

## 2017-06-25 DIAGNOSIS — I071 Rheumatic tricuspid insufficiency: Secondary | ICD-10-CM | POA: Insufficient documentation

## 2017-06-25 NOTE — Progress Notes (Signed)
*  PRELIMINARY RESULTS* Echocardiogram 2D Echocardiogram has been performed.  Samuel Germany 06/25/2017, 12:10 PM

## 2017-06-25 NOTE — H&P (Signed)
Lisa Thornton; [id number]; 1955-08-18   HPI Patient is a 62 year old white female who was referred to my care by the [facility] for evaluation and treatment of the left breast mass.  She under went recent core biopsy of the left breast which revealed poorly differentiated adenocarcinoma.  She is ER positive, PR negative, HER-2 negative.  She also had a biopsy of a left axillary lymph node which was negative.  Patient states her mother was treated for breast cancer in her 56s.  She noticed the mass in the left breast in December 2018, and has noted a significant increase in size over that time.  She denies any nipple discharge or breast pain. Past Medical History:  Diagnosis Date  . Chest pain   . Hypertension     No past surgical history on file.  Family History  Problem Relation Age of Onset  . Breast cancer Mother   . Thyroid disease Mother   . Heart disease Mother   . Heart attack Father   . Heart attack Sister   . Hypertension Sister   . Heart attack Brother   . Cancer Sister   . Stroke Brother   . Alzheimer's disease Maternal Aunt     No current facility-administered medications on file prior to encounter.    Current Outpatient Medications on File Prior to Encounter  Medication Sig Dispense Refill  . metoprolol tartrate (LOPRESSOR) 50 MG tablet Take 50 mg by mouth 2 (two) times daily.      No Known Allergies  Social History   Substance and Sexual Activity  Alcohol Use No    Social History   Tobacco Use  Smoking Status Never Smoker  Smokeless Tobacco Never Used    Review of Systems  Constitutional: Negative.   HENT: Negative.   Eyes: Negative.   Respiratory: Negative.   Cardiovascular: Negative.   Gastrointestinal: Positive for heartburn.  Genitourinary: Negative.   Musculoskeletal: Negative.   Skin: Negative.   Neurological: Negative.   Endo/Heme/Allergies: Negative.   Psychiatric/Behavioral: Negative.     Objective   There were no vitals  filed for this visit.  Physical Exam  Constitutional: She is oriented to person, place, and time and well-developed, well-nourished, and in no distress.  HENT:  Head: Normocephalic and atraumatic.  Neck: Normal range of motion. Neck supple.  Cardiovascular: Normal rate, regular rhythm and normal heart sounds. Exam reveals no gallop and no friction rub.  No murmur heard. Pulmonary/Chest: Effort normal and breath sounds normal. No respiratory distress. She has no wheezes. She has no rales.  Lymphadenopathy:    She has no cervical adenopathy.  Neurological: She is alert and oriented to person, place, and time.  Skin: Skin is warm and dry.  Vitals reviewed. Breast: Large 5 cm mass involving the left areola and 3 o'clock position of the left breast with thin erythematous skin overlying it.  I do not palpate any significant lymphadenopathy in the left breast.  Some nipple retraction noted.  Right breast examination reveals no dominant mass, nipple discharge, dimpling.  The axilla is negative for palpable nodes.  Mammogram, ultrasound, and pathology reports all reviewed  Assessment  Invasive ductal carcinoma of left breast Plan   Given the size of the left breast cancer in relation to her breast, I have contacted oncology to see whether she is a candidate for neoadjuvant therapy.  I told the patient that ultimately she will probably require a left modified radical mastectomy.  Literature was given concerning Port-A-Cath placement  should that be needed.  Further management is pending oncology evaluation.  Patient is fully aware of the diagnosis.

## 2017-06-26 ENCOUNTER — Encounter (HOSPITAL_COMMUNITY)
Admission: RE | Admit: 2017-06-26 | Discharge: 2017-06-26 | Disposition: A | Discharge status: Home / Self Care | Payer: Self-pay | Source: Ambulatory Visit | Attending: General Surgery | Admitting: General Surgery

## 2017-06-26 ENCOUNTER — Encounter (HOSPITAL_COMMUNITY): Payer: Self-pay

## 2017-06-27 ENCOUNTER — Ambulatory Visit (HOSPITAL_COMMUNITY): Payer: Medicaid Other | Admitting: Anesthesiology

## 2017-06-27 ENCOUNTER — Ambulatory Visit (HOSPITAL_COMMUNITY)
Admission: RE | Admit: 2017-06-27 | Discharge: 2017-06-27 | Disposition: A | Discharge status: Home / Self Care | Payer: Medicaid Other | Source: Ambulatory Visit | Attending: General Surgery | Admitting: General Surgery

## 2017-06-27 ENCOUNTER — Encounter (HOSPITAL_COMMUNITY)
Admission: RE | Disposition: A | Discharge status: Home / Self Care | Payer: Self-pay | Source: Ambulatory Visit | Attending: General Surgery

## 2017-06-27 ENCOUNTER — Ambulatory Visit (HOSPITAL_COMMUNITY): Payer: Medicaid Other

## 2017-06-27 ENCOUNTER — Encounter (HOSPITAL_COMMUNITY): Payer: Self-pay | Admitting: Emergency Medicine

## 2017-06-27 DIAGNOSIS — C50812 Malignant neoplasm of overlapping sites of left female breast: Secondary | ICD-10-CM | POA: Insufficient documentation

## 2017-06-27 DIAGNOSIS — Z8249 Family history of ischemic heart disease and other diseases of the circulatory system: Secondary | ICD-10-CM | POA: Insufficient documentation

## 2017-06-27 DIAGNOSIS — I10 Essential (primary) hypertension: Secondary | ICD-10-CM | POA: Diagnosis not present

## 2017-06-27 DIAGNOSIS — Z79899 Other long term (current) drug therapy: Secondary | ICD-10-CM | POA: Insufficient documentation

## 2017-06-27 DIAGNOSIS — Z803 Family history of malignant neoplasm of breast: Secondary | ICD-10-CM | POA: Insufficient documentation

## 2017-06-27 DIAGNOSIS — C50112 Malignant neoplasm of central portion of left female breast: Secondary | ICD-10-CM | POA: Diagnosis not present

## 2017-06-27 DIAGNOSIS — Z170 Estrogen receptor positive status [ER+]: Secondary | ICD-10-CM | POA: Diagnosis not present

## 2017-06-27 DIAGNOSIS — Z95828 Presence of other vascular implants and grafts: Secondary | ICD-10-CM

## 2017-06-27 HISTORY — PX: PORTACATH PLACEMENT: SHX2246

## 2017-06-27 SURGERY — INSERTION, TUNNELED CENTRAL VENOUS DEVICE, WITH PORT
Anesthesia: Monitor Anesthesia Care | Laterality: Right

## 2017-06-27 MED ORDER — LIDOCAINE HCL (PF) 1 % IJ SOLN
INTRAMUSCULAR | Status: AC
  Filled 2017-06-27: qty 30

## 2017-06-27 MED ORDER — FENTANYL CITRATE (PF) 100 MCG/2ML IJ SOLN
INTRAMUSCULAR | Status: AC
  Filled 2017-06-27: qty 2

## 2017-06-27 MED ORDER — PROPOFOL 10 MG/ML IV BOLUS
INTRAVENOUS | Status: DC | PRN
  Administered 2017-06-27: 10 mg via INTRAVENOUS

## 2017-06-27 MED ORDER — LACTATED RINGERS IV SOLN
INTRAVENOUS | Status: DC
  Administered 2017-06-27: 10:00:00 via INTRAVENOUS

## 2017-06-27 MED ORDER — MIDAZOLAM HCL 2 MG/2ML IJ SOLN
INTRAMUSCULAR | Status: AC
  Filled 2017-06-27: qty 2

## 2017-06-27 MED ORDER — MIDAZOLAM HCL 2 MG/2ML IJ SOLN
1.0000 mg | INTRAMUSCULAR | Status: AC
  Administered 2017-06-27: 2 mg via INTRAVENOUS

## 2017-06-27 MED ORDER — HEPARIN SOD (PORK) LOCK FLUSH 100 UNIT/ML IV SOLN
INTRAVENOUS | Status: AC
Start: 2017-06-27 — End: 2017-06-27
  Filled 2017-06-27: qty 5

## 2017-06-27 MED ORDER — KETOROLAC TROMETHAMINE 30 MG/ML IJ SOLN
INTRAMUSCULAR | Status: AC
  Filled 2017-06-27: qty 1

## 2017-06-27 MED ORDER — HEPARIN SOD (PORK) LOCK FLUSH 100 UNIT/ML IV SOLN
INTRAVENOUS | Status: DC | PRN
  Administered 2017-06-27: 500 [IU] via INTRAVENOUS

## 2017-06-27 MED ORDER — PROPOFOL 500 MG/50ML IV EMUL
INTRAVENOUS | Status: DC | PRN
  Administered 2017-06-27: 75 ug/kg/min via INTRAVENOUS

## 2017-06-27 MED ORDER — FENTANYL CITRATE (PF) 100 MCG/2ML IJ SOLN
25.0000 ug | Freq: Once | INTRAMUSCULAR | Status: AC
  Administered 2017-06-27: 25 ug via INTRAVENOUS

## 2017-06-27 MED ORDER — CHLORHEXIDINE GLUCONATE CLOTH 2 % EX PADS: 6.0000 | MEDICATED_PAD | Freq: Once | CUTANEOUS | Status: DC

## 2017-06-27 MED ORDER — CHLORHEXIDINE GLUCONATE CLOTH 2 % EX PADS
6.0000 | MEDICATED_PAD | Freq: Once | CUTANEOUS | Status: AC
  Administered 2017-06-27: 6 via TOPICAL

## 2017-06-27 MED ORDER — KETOROLAC TROMETHAMINE 30 MG/ML IJ SOLN
30.0000 mg | Freq: Once | INTRAMUSCULAR | Status: AC
  Administered 2017-06-27: 30 mg via INTRAVENOUS

## 2017-06-27 MED ORDER — LIDOCAINE HCL (PF) 1 % IJ SOLN
INTRAMUSCULAR | Status: DC | PRN
  Administered 2017-06-27: 12 mL

## 2017-06-27 MED ORDER — SODIUM CHLORIDE 0.9 % IV SOLN
INTRAVENOUS | Status: AC | PRN
  Administered 2017-06-27: 5 mL

## 2017-06-27 MED ORDER — PROPOFOL 10 MG/ML IV BOLUS
INTRAVENOUS | Status: AC
  Filled 2017-06-27: qty 20

## 2017-06-27 MED ORDER — CEFAZOLIN SODIUM-DEXTROSE 2-4 GM/100ML-% IV SOLN
2.0000 g | INTRAVENOUS | Status: AC
  Administered 2017-06-27: 2 g via INTRAVENOUS
  Filled 2017-06-27: qty 100

## 2017-06-27 MED ORDER — HYDROCODONE-ACETAMINOPHEN 5-325 MG PO TABS: 1.0000 | ORAL_TABLET | Freq: Four times a day (QID) | ORAL | 0 refills | Status: DC | PRN

## 2017-06-27 SURGICAL SUPPLY — 30 items
BAG DECANTER FOR FLEXI CONT (MISCELLANEOUS) ×3 IMPLANT
BAG HAMPER (MISCELLANEOUS) ×3 IMPLANT
CHLORAPREP W/TINT 10.5 ML (MISCELLANEOUS) ×3 IMPLANT
CLOTH BEACON ORANGE TIMEOUT ST (SAFETY) ×3 IMPLANT
COVER LIGHT HANDLE STERIS (MISCELLANEOUS) ×6 IMPLANT
DECANTER SPIKE VIAL GLASS SM (MISCELLANEOUS) ×3 IMPLANT
DERMABOND ADVANCED (GAUZE/BANDAGES/DRESSINGS) ×2
DERMABOND ADVANCED .7 DNX12 (GAUZE/BANDAGES/DRESSINGS) ×1 IMPLANT
DRAPE C-ARM FOLDED MOBILE STRL (DRAPES) ×3 IMPLANT
ELECT REM PT RETURN 9FT ADLT (ELECTROSURGICAL) ×3
ELECTRODE REM PT RTRN 9FT ADLT (ELECTROSURGICAL) ×1 IMPLANT
GLOVE BIO SURGEON STRL SZ7 (GLOVE) ×3 IMPLANT
GLOVE BIOGEL PI IND STRL 7.0 (GLOVE) ×2 IMPLANT
GLOVE BIOGEL PI INDICATOR 7.0 (GLOVE) ×4
GLOVE SURG SS PI 7.5 STRL IVOR (GLOVE) ×3 IMPLANT
GOWN STRL REUS W/TWL LRG LVL3 (GOWN DISPOSABLE) ×6 IMPLANT
IV NS 500ML (IV SOLUTION) ×2
IV NS 500ML BAXH (IV SOLUTION) ×1 IMPLANT
KIT PORT POWER 8FR ISP MRI (Port) ×3 IMPLANT
KIT TURNOVER KIT A (KITS) ×3 IMPLANT
MANIFOLD NEPTUNE II (INSTRUMENTS) ×3 IMPLANT
NEEDLE HYPO 25X1 1.5 SAFETY (NEEDLE) ×3 IMPLANT
PACK MINOR (CUSTOM PROCEDURE TRAY) ×3 IMPLANT
PAD ARMBOARD 7.5X6 YLW CONV (MISCELLANEOUS) ×3 IMPLANT
SET BASIN LINEN APH (SET/KITS/TRAYS/PACK) ×3 IMPLANT
SUT MNCRL AB 4-0 PS2 18 (SUTURE) ×3 IMPLANT
SUT VIC AB 3-0 SH 27 (SUTURE) ×2
SUT VIC AB 3-0 SH 27X BRD (SUTURE) ×1 IMPLANT
SYR 20CC LL (SYRINGE) ×3 IMPLANT
SYR CONTROL 10ML LL (SYRINGE) ×3 IMPLANT

## 2017-06-27 NOTE — Op Note (Signed)
Patient:  Lisa Thornton  DOB:  1955-11-12  MRN:  [id number]   Preop Diagnosis: Left breast cancer  Postop Diagnosis: Same  Procedure: Port-A-Cath insertion  Surgeon: Aviva Signs, MD  Anes: MAC  Indications: Patient is a 62 year old white female who presents for Port-A-Cath insertion as she is about to undergo chemotherapy for left breast cancer.  The risks and benefits of the procedure including bleeding, infection, and pneumothorax were fully explained to the patient, who gave informed consent.  Procedure note: The patient was placed in the Trendelenburg position after monitored anesthesia care was given.  The right upper chest was prepped and draped using the usual sterile technique with DuraPrep.  Surgical site confirmation was performed.  An incision was made below the right clavicle.  A subcutaneous pocket was formed.  A needle was advanced into the right subclavian vein using the Seldinger technique without difficulty.  A guidewire was then advanced into the right atrium under fluoroscopic guidance.  An introducer peel-away sheath were placed over the guidewire.  The catheter was then inserted through the peel-away sheath and the peel-away sheath was removed.  The catheter was then attached to the port and the port placed in subcutaneous pocket.  Adequate positioning was confirmed by fluoroscopy.  Good backflow of blood was noted on aspiration of the port.  The port was flushed with heparin flush.  Subcutaneous layer was reapproximated using a 3-0 Vicryl interrupted suture.  The skin was closed using a 4-0 Monocryl subcuticular suture.  Dermabond was applied.  All tape and needle counts were correct at the end of the procedure.  The patient was transferred to PACU in stable condition.  A chest x-ray will be performed at that time.    Complications: None  EBL: Minimal  Specimen: None

## 2017-06-27 NOTE — Anesthesia Postprocedure Evaluation (Signed)
Anesthesia Post Note  Patient: Lisa Thornton  Procedure(s) Performed: INSERTION PORT-A-CATH (Right )  Patient location during evaluation: PACU Anesthesia Type: MAC Level of consciousness: awake and alert and oriented Pain management: pain level controlled Respiratory status: spontaneous breathing Cardiovascular status: blood pressure returned to baseline and stable Postop Assessment: no apparent nausea or vomiting Anesthetic complications: no Comments: Late entry     Last Vitals:  Vitals:   06/27/17 1145 06/27/17 1156  BP: (!) 172/84 (!) 192/71  Pulse: 64 65  Resp: 15 18  Temp:  36.8 C  SpO2: 99% 100%    Last Pain:  Vitals:   06/27/17 1158  TempSrc:   PainSc: 0-No pain                 Sanvi Ehler

## 2017-06-27 NOTE — Discharge Instructions (Signed)
Implanted Port Home Guide °An implanted port is a type of central line that is placed under the skin. Central lines are used to provide IV access when treatment or nutrition needs to be given through a person’s veins. Implanted ports are used for long-term IV access. An implanted port may be placed because: °· You need IV medicine that would be irritating to the small veins in your hands or arms. °· You need long-term IV medicines, such as antibiotics. °· You need IV nutrition for a long period. °· You need frequent blood draws for lab tests. °· You need dialysis. ° °Implanted ports are usually placed in the chest area, but they can also be placed in the upper arm, the abdomen, or the leg. An implanted port has two main parts: °· Reservoir. The reservoir is round and will appear as a small, raised area under your skin. The reservoir is the part where a needle is inserted to give medicines or draw blood. °· Catheter. The catheter is a thin, flexible tube that extends from the reservoir. The catheter is placed into a large vein. Medicine that is inserted into the reservoir goes into the catheter and then into the vein. ° °How will I care for my incision site? °Do not get the incision site wet. Bathe or shower as directed by your health care provider. °How is my port accessed? °Special steps must be taken to access the port: °· Before the port is accessed, a numbing cream can be placed on the skin. This helps numb the skin over the port site. °· Your health care provider uses a sterile technique to access the port. °? Your health care provider must put on a mask and sterile gloves. °? The skin over your port is cleaned carefully with an antiseptic and allowed to dry. °? The port is gently pinched between sterile gloves, and a needle is inserted into the port. °· Only "non-coring" port needles should be used to access the port. Once the port is accessed, a blood return should be checked. This helps ensure that the port  is in the vein and is not clogged. °· If your port needs to remain accessed for a constant infusion, a clear (transparent) bandage will be placed over the needle site. The bandage and needle will need to be changed every week, or as directed by your health care provider. °· Keep the bandage covering the needle clean and dry. Do not get it wet. Follow your health care provider’s instructions on how to take a shower or bath while the port is accessed. °· If your port does not need to stay accessed, no bandage is needed over the port. ° °What is flushing? °Flushing helps keep the port from getting clogged. Follow your health care provider’s instructions on how and when to flush the port. Ports are usually flushed with saline solution or a medicine called heparin. The need for flushing will depend on how the port is used. °· If the port is used for intermittent medicines or blood draws, the port will need to be flushed: °? After medicines have been given. °? After blood has been drawn. °? As part of routine maintenance. °· If a constant infusion is running, the port may not need to be flushed. ° °How long will my port stay implanted? °The port can stay in for as long as your health care provider thinks it is needed. When it is time for the port to come out, surgery will be   done to remove it. The procedure is similar to the one performed when the port was put in. °When should I seek immediate medical care? °When you have an implanted port, you should seek immediate medical care if: °· You notice a bad smell coming from the incision site. °· You have swelling, redness, or drainage at the incision site. °· You have more swelling or pain at the port site or the surrounding area. °· You have a fever that is not controlled with medicine. ° °This information is not intended to replace advice given to you by your health care provider. Make sure you discuss any questions you have with your health care provider. °Document  Released: 03/20/2005 Document Revised: 08/26/2015 Document Reviewed: 11/25/2012 °Elsevier Interactive Patient Education © 2017 [employer] ° ° ° °PATIENT INSTRUCTIONS °POST-ANESTHESIA ° °IMMEDIATELY FOLLOWING SURGERY:  Do not drive or operate machinery for the first twenty four hours after surgery.  Do not make any important decisions for twenty four hours after surgery or while taking narcotic pain medications or sedatives.  If you develop intractable nausea and vomiting or a severe headache please notify your doctor immediately. ° °FOLLOW-UP:  Please make an appointment with your surgeon as instructed. You do not need to follow up with anesthesia unless specifically instructed to do so. ° °WOUND CARE INSTRUCTIONS (if applicable):  Keep a dry clean dressing on the anesthesia/puncture wound site if there is drainage.  Once the wound has quit draining you may leave it open to air.  Generally you should leave the bandage intact for twenty four hours unless there is drainage.  If the epidural site drains for more than 36-48 hours please call the anesthesia department. ° °QUESTIONS?:  Please feel free to call your physician or the hospital operator if you have any questions, and they will be happy to assist you.    ° ° ° °

## 2017-06-27 NOTE — Anesthesia Preprocedure Evaluation (Signed)
Anesthesia Evaluation  Patient identified by MRN, date of birth, ID band Patient awake    Reviewed: Allergy & Precautions, NPO status , Patient's Chart, lab work & pertinent test results  Airway Mallampati: III  TM Distance: >3 FB Neck ROM: Full  Mouth opening: Limited Mouth Opening  Dental  (+) Poor Dentition, Missing, Loose, Dental Advisory Given   Pulmonary neg pulmonary ROS,    breath sounds clear to auscultation       Cardiovascular hypertension, Pt. on medications  Rhythm:Regular Rate:Normal     Neuro/Psych negative neurological ROS  negative psych ROS   GI/Hepatic negative GI ROS, Neg liver ROS,   Endo/Other  negative endocrine ROS  Renal/GU negative Renal ROS     Musculoskeletal   Abdominal   Peds  Hematology negative hematology ROS (+)   Anesthesia Other Findings Breast cancer   Reproductive/Obstetrics                             Anesthesia Physical Anesthesia Plan  ASA: II  Anesthesia Plan: MAC   Post-op Pain Management:    Induction: Intravenous  PONV Risk Score and Plan:   Airway Management Planned: Simple Face Mask  Additional Equipment:   Intra-op Plan:   Post-operative Plan:   Informed Consent: I have reviewed the patients History and Physical, chart, labs and discussed the procedure including the risks, benefits and alternatives for the proposed anesthesia with the patient or authorized representative who has indicated his/her understanding and acceptance.     Plan Discussed with:   Anesthesia Plan Comments:         Anesthesia Quick Evaluation

## 2017-06-27 NOTE — Interval H&P Note (Signed)
History and Physical Interval Note:  06/27/2017 9:08 AM  Lisa Thornton  has presented today for surgery, with the diagnosis of left breast cancer  The various methods of treatment have been discussed with the patient and family. After consideration of risks, benefits and other options for treatment, the patient has consented to  Procedure(s): INSERTION PORT-A-CATH (Right) as a surgical intervention .  The patient's history has been reviewed, patient examined, no change in status, stable for surgery.  I have reviewed the patient's chart and labs.  Questions were answered to the patient's satisfaction.     Aviva Signs

## 2017-06-27 NOTE — Transfer of Care (Signed)
Immediate Anesthesia Transfer of Care Note  Patient: Lisa Thornton  Procedure(s) Performed: INSERTION PORT-A-CATH (Right )  Patient Location: PACU  Anesthesia Type:MAC  Level of Consciousness: awake, alert  and oriented  Airway & Oxygen Therapy: Patient Spontanous Breathing  Post-op Assessment: Report given to RN  Post vital signs: Reviewed and stable  Last Vitals:  Vitals Value Taken Time  BP    Temp    Pulse 60 06/27/2017 11:26 AM  Resp 14 06/27/2017 11:26 AM  SpO2 99 % 06/27/2017 11:26 AM  Vitals shown include unvalidated device data.  Last Pain:  Vitals:   06/27/17 0914  TempSrc: Oral  PainSc: 0-No pain         Complications: No apparent anesthesia complications

## 2017-06-28 ENCOUNTER — Encounter (HOSPITAL_COMMUNITY): Payer: Self-pay | Admitting: General Surgery

## 2017-06-29 ENCOUNTER — Ambulatory Visit (HOSPITAL_COMMUNITY)
Admission: RE | Admit: 2017-06-29 | Discharge: 2017-06-29 | Disposition: A | Discharge status: Home / Self Care | Payer: Medicaid Other | Source: Ambulatory Visit | Attending: Hematology | Admitting: Hematology

## 2017-06-29 DIAGNOSIS — R599 Enlarged lymph nodes, unspecified: Secondary | ICD-10-CM | POA: Insufficient documentation

## 2017-06-29 DIAGNOSIS — K760 Fatty (change of) liver, not elsewhere classified: Secondary | ICD-10-CM | POA: Diagnosis not present

## 2017-06-29 DIAGNOSIS — Z959 Presence of cardiac and vascular implant and graft, unspecified: Secondary | ICD-10-CM | POA: Insufficient documentation

## 2017-06-29 DIAGNOSIS — C50412 Malignant neoplasm of upper-outer quadrant of left female breast: Secondary | ICD-10-CM | POA: Diagnosis present

## 2017-06-29 MED ORDER — IOPAMIDOL (ISOVUE-300) INJECTION 61%
75.0000 mL | Freq: Once | INTRAVENOUS | Status: AC | PRN
  Administered 2017-06-29: 75 mL via INTRAVENOUS

## 2017-07-02 ENCOUNTER — Inpatient Hospital Stay (HOSPITAL_COMMUNITY): Payer: Medicaid Other | Attending: Hematology | Admitting: Hematology

## 2017-07-02 ENCOUNTER — Other Ambulatory Visit: Payer: Self-pay

## 2017-07-02 ENCOUNTER — Encounter (HOSPITAL_COMMUNITY): Payer: Self-pay | Admitting: Hematology

## 2017-07-02 VITALS — BP 205/80 | HR 66 | Temp 99.1°F | Resp 18 | Wt 168.0 lb

## 2017-07-02 DIAGNOSIS — I10 Essential (primary) hypertension: Secondary | ICD-10-CM | POA: Diagnosis not present

## 2017-07-02 DIAGNOSIS — Z5111 Encounter for antineoplastic chemotherapy: Secondary | ICD-10-CM | POA: Diagnosis present

## 2017-07-02 DIAGNOSIS — Z170 Estrogen receptor positive status [ER+]: Secondary | ICD-10-CM | POA: Diagnosis not present

## 2017-07-02 DIAGNOSIS — R590 Localized enlarged lymph nodes: Secondary | ICD-10-CM | POA: Insufficient documentation

## 2017-07-02 DIAGNOSIS — C50412 Malignant neoplasm of upper-outer quadrant of left female breast: Secondary | ICD-10-CM | POA: Diagnosis not present

## 2017-07-02 DIAGNOSIS — Z5189 Encounter for other specified aftercare: Secondary | ICD-10-CM | POA: Diagnosis not present

## 2017-07-02 NOTE — Progress Notes (Signed)
Patient Care Team: Default, Provider, MD as PCP - General  DIAGNOSIS:  Encounter Diagnosis  Name Primary?  . Malignant neoplasm of upper-outer quadrant of left breast in female, estrogen receptor positive ([place]) Yes    SUMMARY OF ONCOLOGIC HISTORY:   Breast cancer of upper-outer quadrant of left female breast ([place])   06/22/2017 Initial Diagnosis    Breast cancer of upper-outer quadrant of left female breast ([place])      06/22/2017 Cancer Staging    Staging form: Breast, AJCC 8th Edition - Clinical stage from 06/22/2017: Stage IIIB (cT3, cN1, cM0, G3, ER+, PR-, HER2-) - Signed by Derek Jack, MD on 06/22/2017      06/29/2017 Imaging    CT chest showing left axillary adenopathy, 1.2 cm anterior carinal adenopathy, left subpectoral lymph node subcentimeter  06/25/2017 2D echocardiogram with ejection fraction of 55-60%       CHIEF COMPLIANT: She is here for follow-up of CT scan results.  INTERVAL HISTORY: Lisa Thornton is seen today for follow-up of CT scan and echocardiogram results.  She does not report any chest pains or lightheadedness.  She is currently taking 50 mg of metoprolol twice daily for blood pressure.  She had port placed last Wednesday.  She denies any new onset pains.  REVIEW OF SYSTEMS:   Constitutional: Denies fevers, chills or abnormal weight loss Eyes: Denies blurriness of vision Ears, nose, mouth, throat, and face: Denies mucositis or sore throat Respiratory: Denies cough, dyspnea or wheezes Cardiovascular: Denies palpitation, chest discomfort Gastrointestinal:  Denies nausea, heartburn or change in bowel habits Skin: Denies abnormal skin rashes Lymphatics: Denies new lymphadenopathy or easy bruising Neurological:Denies numbness, tingling or new weaknesses Behavioral/Psych: Mood is stable, no new changes  Extremities: No lower extremity edema Breast: denies any pain or lumps or nodules in either breasts All other systems were reviewed with the  patient and are negative.  I have reviewed the past medical history, past surgical history, social history and family history with the patient and they are unchanged from previous note.  ALLERGIES:  has No Known Allergies.  MEDICATIONS:  Current Outpatient Medications  Medication Sig Dispense Refill  . HYDROcodone-acetaminophen (NORCO) 5-325 MG tablet Take 1 tablet by mouth every 6 (six) hours as needed for moderate pain. 25 tablet 0  . metoprolol tartrate (LOPRESSOR) 50 MG tablet Take 50 mg by mouth 2 (two) times daily.     No current facility-administered medications for this visit.     PHYSICAL EXAMINATION: ECOG PERFORMANCE STATUS: 1 - Symptomatic but completely ambulatory  Vitals:   07/02/17 1010  BP: (!) 205/80  Pulse: 66  Resp: 18  Temp: 99.1 F (37.3 C)  SpO2: 100%   Filed Weights   07/02/17 1010  Weight: 168 lb (76.2 kg)    GENERAL:alert, no distress and comfortable SKIN: skin color, texture, turgor are normal, no rashes or significant lesions EYES: normal, Conjunctiva are pink and non-injected, sclera clear OROPHARYNX:no mucositis, no erythema and lips, buccal mucosa, and tongue normal  NECK: supple, thyroid normal size, non-tender, without nodularity LYMPH:  no palpable lymphadenopathy in the cervical, axillary or inguinal LUNGS: clear to auscultation and percussion with normal breathing effort HEART: regular rate & rhythm and no murmurs and no lower extremity edema ABDOMEN:abdomen soft, non-tender and normal bowel sounds MUSCULOSKELETAL:no cyanosis of digits and no clubbing   EXTREMITIES: No lower extremity edema Right chest wall port site is well-healed. LABORATORY DATA:  I have reviewed the data as listed CMP Latest Ref Rng &  Units 06/22/2017 06/25/2010  Glucose 65 - 99 mg/dL 134(H) 118(H)  BUN 6 - 20 mg/dL 15 9  Creatinine 0.44 - 1.00 mg/dL 0.91 0.95  Sodium 135 - 145 mmol/L 137 139  Potassium 3.5 - 5.1 mmol/L 4.3 3.1(L)  Chloride 101 - 111 mmol/L  99(L) 99  CO2 22 - 32 mmol/L 26 27  Calcium 8.9 - 10.3 mg/dL 9.8 9.2  Total Protein 6.5 - 8.1 g/dL 8.7(H) -  Total Bilirubin 0.3 - 1.2 mg/dL 0.5 -  Alkaline Phos 38 - 126 U/L 60 -  AST 15 - 41 U/L 24 -  ALT 14 - 54 U/L 20 -   No results found for: [id number]   Lab Results  Component Value Date   WBC 11.1 (H) 06/22/2017   HGB 13.6 06/22/2017   HCT 40.1 06/22/2017   MCV 88.3 06/22/2017   PLT 268 06/22/2017   NEUTROABS 7.4 06/22/2017    ASSESSMENT & PLAN:  Breast cancer of upper-outer quadrant of left female breast ([place]) 1.  Poorly differentiated left breast cancer, ER positive, PR and HER-2 negative: - On 06/12/2017, left breast biopsy with sarcomatoid features, left axillary lymph node biopsy negative. -I have discussed the findings on the CT scan of the chest dated 06/29/2017 which showed carinal adenopathy measuring 1.2 cm, left subpectoral subcentimeter adenopathy, multiple small left axillary lymph nodes.  In view of these findings, I have recommended doing a PET/CT scan.  We will arrange it as soon as possible.  I will see her back after the PET/CT scan.  She had a port placed on the right chest wall which is well-healed.  I have also discussed 2D echocardiogram findings which shows EF of 55-60%.  2.  Newly diagnosed hypertension: She is taking metoprolol 50 mg twice daily.  Her systolic blood pressure is still elevated at 200.  She is completely asymptomatic.  She will go to her primary care doctor later today for optimal control of hypertension.       Orders Placed This Encounter  Procedures  . NM PET Image Initial (PI) Skull Base To Thigh    Order Specific Question:   If indicated for the ordered procedure, I authorize the administration of a radiopharmaceutical per Radiology protocol    Answer:   Yes    Order Specific Question:   Preferred imaging location?    Answer:   [facility]    Order Specific Question:   Radiology Contrast Protocol - do NOT remove file path     Answer:   [email or web address]    Order Specific Question:   Reason for Exam additional comments    Answer:   Newly diagnosed breast cancer, CT scan of the chest with precarinal and subpectoral adenopathy   The patient has a good understanding of the overall plan. she agrees with it. she will call with any problems that may develop before the next visit here.   Derek Jack, MD 07/02/17

## 2017-07-02 NOTE — Assessment & Plan Note (Signed)
1.  Poorly differentiated left breast cancer, ER positive, PR and HER-2 negative: - On 06/12/2017, left breast biopsy with sarcomatoid features, left axillary lymph node biopsy negative. -I have discussed the findings on the CT scan of the chest dated 06/29/2017 which showed carinal adenopathy measuring 1.2 cm, left subpectoral subcentimeter adenopathy, multiple small left axillary lymph nodes.  In view of these findings, I have recommended doing a PET/CT scan.  We will arrange it as soon as possible.  I will see her back after the PET/CT scan.  She had a port placed on the right chest wall which is well-healed.  I have also discussed 2D echocardiogram findings which shows EF of 55-60%.  2.  Newly diagnosed hypertension: She is taking metoprolol 50 mg twice daily.  Her systolic blood pressure is still elevated at 200.  She is completely asymptomatic.  She will go to her primary care doctor later today for optimal control of hypertension.

## 2017-07-10 ENCOUNTER — Ambulatory Visit (HOSPITAL_COMMUNITY)
Admission: RE | Admit: 2017-07-10 | Discharge: 2017-07-10 | Disposition: A | Discharge status: Home / Self Care | Payer: Medicaid Other | Source: Ambulatory Visit | Attending: Hematology | Admitting: Hematology

## 2017-07-10 DIAGNOSIS — C50412 Malignant neoplasm of upper-outer quadrant of left female breast: Secondary | ICD-10-CM | POA: Insufficient documentation

## 2017-07-10 DIAGNOSIS — Z170 Estrogen receptor positive status [ER+]: Secondary | ICD-10-CM | POA: Diagnosis not present

## 2017-07-10 DIAGNOSIS — K760 Fatty (change of) liver, not elsewhere classified: Secondary | ICD-10-CM | POA: Diagnosis not present

## 2017-07-10 LAB — GLUCOSE, CAPILLARY: GLUCOSE-CAPILLARY: 130 mg/dL — AB (ref 65–99)

## 2017-07-10 MED ORDER — FLUDEOXYGLUCOSE F - 18 (FDG) INJECTION
8.8900 | Freq: Once | INTRAVENOUS | Status: AC | PRN
  Administered 2017-07-10: 8.89 via INTRAVENOUS

## 2017-07-12 ENCOUNTER — Encounter (HOSPITAL_COMMUNITY): Payer: Self-pay | Admitting: Hematology

## 2017-07-12 ENCOUNTER — Inpatient Hospital Stay (HOSPITAL_BASED_OUTPATIENT_CLINIC_OR_DEPARTMENT_OTHER): Payer: Medicaid Other | Admitting: Hematology

## 2017-07-12 VITALS — BP 157/65 | HR 71 | Temp 97.5°F | Resp 18 | Wt 162.9 lb

## 2017-07-12 DIAGNOSIS — Z5111 Encounter for antineoplastic chemotherapy: Secondary | ICD-10-CM | POA: Diagnosis not present

## 2017-07-12 DIAGNOSIS — Z170 Estrogen receptor positive status [ER+]: Secondary | ICD-10-CM

## 2017-07-12 DIAGNOSIS — I10 Essential (primary) hypertension: Secondary | ICD-10-CM

## 2017-07-12 DIAGNOSIS — M8580 Other specified disorders of bone density and structure, unspecified site: Secondary | ICD-10-CM

## 2017-07-12 DIAGNOSIS — C50412 Malignant neoplasm of upper-outer quadrant of left female breast: Secondary | ICD-10-CM

## 2017-07-12 NOTE — Progress Notes (Signed)
Patient Care Team: Default, Provider, MD as PCP - General  DIAGNOSIS:  Encounter Diagnosis  Name Primary?  . Malignant neoplasm of upper-outer quadrant of left breast in female, estrogen receptor positive ([place]) Yes    SUMMARY OF ONCOLOGIC HISTORY:   Breast cancer of upper-outer quadrant of left female breast ([place])   06/22/2017 Initial Diagnosis    Breast cancer of upper-outer quadrant of left female breast ([place])      06/22/2017 Cancer Staging    Staging form: Breast, AJCC 8th Edition - Clinical stage from 06/22/2017: Stage IIIB (cT3, cN1, cM0, G3, ER+, PR-, HER2-) - Signed by Derek Jack, MD on 06/22/2017      06/29/2017 Imaging    CT chest showing left axillary adenopathy, 1.2 cm anterior carinal adenopathy, left subpectoral lymph node subcentimeter  06/25/2017 2D echocardiogram with ejection fraction of 55-60%      07/16/2017 -  Chemotherapy    The patient had DOXOrubicin (ADRIAMYCIN) chemo injection 110 mg, 60 mg/m2 = 110 mg, Intravenous,  Once, 0 of 4 cycles palonosetron (ALOXI) injection 0.25 mg, 0.25 mg, Intravenous,  Once, 0 of 4 cycles pegfilgrastim-cbqv (UDENYCA) injection 6 mg, 6 mg, Subcutaneous, Once, 0 of 4 cycles cyclophosphamide (CYTOXAN) 1,100 mg in sodium chloride 0.9 % 250 mL chemo infusion, 600 mg/m2 = 1,100 mg, Intravenous,  Once, 0 of 4 cycles PACLitaxel (TAXOL) 144 mg in dextrose 5 % 250 mL chemo infusion (</= 10m/m2), 80 mg/m2, Intravenous,  Once, 0 of 12 cycles fosaprepitant (EMEND) 150 mg, dexamethasone (DECADRON) 12 mg in sodium chloride 0.9 % 145 mL IVPB, , Intravenous,  Once, 0 of 4 cycles  for chemotherapy treatment.        CHIEF COMPLIANT:   INTERVAL HISTORY: Lisa HEDSTROMis here for follow-up of PET scan results.  She denies any new onset pains.  Her port is not giving any problems.  She denies any shortness of breath.  Her blood pressure is better controlled.  Apparently her primary care doctor started her on hydrochlorothiazide.  She  does not remember the dose.  REVIEW OF SYSTEMS:   Constitutional: Denies fevers, chills or abnormal weight loss.  She complains of mild fatigue. Eyes: Denies blurriness of vision Ears, nose, mouth, throat, and face: Denies mucositis or sore throat Respiratory: Denies cough, dyspnea or wheezes Cardiovascular: Denies palpitation, chest discomfort Gastrointestinal:  Denies nausea, heartburn or change in bowel habits Skin: Denies abnormal skin rashes Lymphatics: Denies new lymphadenopathy or easy bruising Neurological:Denies numbness, tingling or new weaknesses Behavioral/Psych: Mood is stable, no new changes  Extremities: No lower extremity edema Breast: denies any pain or lumps or nodules in either breasts All other systems were reviewed with the patient and are negative.  I have reviewed the past medical history, past surgical history, social history and family history with the patient and they are unchanged from previous note.  ALLERGIES:  has No Known Allergies.  MEDICATIONS:  Current Outpatient Medications  Medication Sig Dispense Refill  . HYDROcodone-acetaminophen (NORCO) 5-325 MG tablet Take 1 tablet by mouth every 6 (six) hours as needed for moderate pain. 25 tablet 0  . metoprolol tartrate (LOPRESSOR) 50 MG tablet Take 50 mg by mouth 2 (two) times daily.    .Marland Kitchentriamterene-hydrochlorothiazide (MAXZIDE-25) 37.5-25 MG tablet   5   No current facility-administered medications for this visit.     PHYSICAL EXAMINATION: ECOG PERFORMANCE STATUS: 1 - Symptomatic but completely ambulatory  Vitals:   07/12/17 1453  BP: (!) 157/65  Pulse: 71  Resp:  18  Temp: (!) 97.5 F (36.4 C)  SpO2: 98%   Filed Weights   07/12/17 1453  Weight: 162 lb 14.4 oz (73.9 kg)    GENERAL:alert, no distress and comfortable   LABORATORY DATA:  I have reviewed the data as listed CMP Latest Ref Rng & Units 06/22/2017 06/25/2010  Glucose 65 - 99 mg/dL 134(H) 118(H)  BUN 6 - 20 mg/dL 15 9    Creatinine 0.44 - 1.00 mg/dL 0.91 0.95  Sodium 135 - 145 mmol/L 137 139  Potassium 3.5 - 5.1 mmol/L 4.3 3.1(L)  Chloride 101 - 111 mmol/L 99(L) 99  CO2 22 - 32 mmol/L 26 27  Calcium 8.9 - 10.3 mg/dL 9.8 9.2  Total Protein 6.5 - 8.1 g/dL 8.7(H) -  Total Bilirubin 0.3 - 1.2 mg/dL 0.5 -  Alkaline Phos 38 - 126 U/L 60 -  AST 15 - 41 U/L 24 -  ALT 14 - 54 U/L 20 -   No results found for: [id number]   Lab Results  Component Value Date   WBC 11.1 (H) 06/22/2017   HGB 13.6 06/22/2017   HCT 40.1 06/22/2017   MCV 88.3 06/22/2017   PLT 268 06/22/2017   NEUTROABS 7.4 06/22/2017    ASSESSMENT & PLAN:  Breast cancer of upper-outer quadrant of left female breast ([place]) 1.  Poorly differentiated left breast cancer, ER positive, PR and HER-2 negative: - On 06/12/2017, left breast biopsy with sarcomatoid features, left axillary lymph node biopsy negative.  2D echo shows EF of 55-60%. -As her CT scan of the chest dated 06/29/2017 showed carinal adenopathy, we have ordered PET/CT scan.  PET/CT scan did not show any evidence of intrathoracic adenopathy.  The left breast mass had an SUV of 20+.  It was measuring 3.5 cm.  There is FDG avid left axillary adenopathy.  We discussed starting her neoadjuvant chemotherapy with dose dense Adriamycin every 2 weeks for 4 cycles with Neulasta support, followed by weekly paclitaxel for 12 weeks.  We talked about the side effects including but not limited to alopecia, bone marrow suppression, life-threatening infections, 1% chance of congestive heart failure, late onset MDS, peripheral neuropathy, nail changes among others.  She understands and gives his permission to proceed with the treatment.  Tentatively will schedule her for first cycle next week.  She will have chemotherapy education done by our nurse navigator.  She will be seen back in 2 weeks after first cycle.   2.  Newly diagnosed hypertension: She is taking metoprolol 50 mg twice daily.  Hydrochlorothiazide was  added as her blood pressure was poorly controlled.  Today her systolic is 166.       Breast Cancer therapy associated bone loss: I have recommended calcium, Vitamin D and weight bearing exercises.  I spent 25 minutes talking to the patient of which more than half was spent in counseling and coordination of care.  Orders Placed This Encounter  Procedures  . CBC with Differential    Standing Status:   Future    Standing Expiration Date:   07/12/2018  . Comprehensive metabolic panel    Standing Status:   Future    Standing Expiration Date:   07/12/2018   The patient has a good understanding of the overall plan. she agrees with it. she will call with any problems that may develop before the next visit here.   Derek Jack, MD 07/12/17

## 2017-07-12 NOTE — Patient Instructions (Signed)
[facility]  Discharge Instructions:  You were seen by Dr. Katragadda.  _______________________________________________________________  Thank you for choosing [facility] at [facility] to provide your oncology and hematology care.  To afford each patient quality time with our providers, please arrive at least 15 minutes before your scheduled appointment.  You need to re-schedule your appointment if you arrive 10 or more minutes late.  We strive to give you quality time with our providers, and arriving late affects you and other patients whose appointments are after yours.  Also, if you no show three or more times for appointments you may be dismissed from the clinic.  Again, thank you for choosing  [facility]. Our hope is that these requests will allow you access to exceptional care and in a timely manner. _______________________________________________________________  If you have questions after your visit, please contact our office at [phone] between the hours of 8:30 a.m. and 5:00 p.m. Voicemails left after 4:30 p.m. will not be returned until the following business day. _______________________________________________________________  For prescription refill requests, have your pharmacy contact our office. _______________________________________________________________  Recommendations made by the consultant and any test results will be sent to your referring physician. _______________________________________________________________ 

## 2017-07-12 NOTE — Assessment & Plan Note (Signed)
1.  Poorly differentiated left breast cancer, ER positive, PR and HER-2 negative: - On 06/12/2017, left breast biopsy with sarcomatoid features, left axillary lymph node biopsy negative.  2D echo shows EF of 55-60%. -As her CT scan of the chest dated 06/29/2017 showed carinal adenopathy, we have ordered PET/CT scan.  PET/CT scan did not show any evidence of intrathoracic adenopathy.  The left breast mass had an SUV of 20+.  It was measuring 3.5 cm.  There is FDG avid left axillary adenopathy.  We discussed starting her neoadjuvant chemotherapy with dose dense Adriamycin every 2 weeks for 4 cycles with Neulasta support, followed by weekly paclitaxel for 12 weeks.  We talked about the side effects including but not limited to alopecia, bone marrow suppression, life-threatening infections, 1% chance of congestive heart failure, late onset MDS, peripheral neuropathy, nail changes among others.  She understands and gives his permission to proceed with the treatment.  Tentatively will schedule her for first cycle next week.  She will have chemotherapy education done by our nurse navigator.  She will be seen back in 2 weeks after first cycle.   2.  Newly diagnosed hypertension: She is taking metoprolol 50 mg twice daily.  Hydrochlorothiazide was added as her blood pressure was poorly controlled.  Today her systolic is 343.

## 2017-07-12 NOTE — Progress Notes (Signed)
START ON PATHWAY REGIMEN - Breast   Doxorubicin + Cyclophosphamide (AC):   A cycle is every 21 days:     Doxorubicin      Cyclophosphamide   **Always confirm dose/schedule in your pharmacy ordering system**    Paclitaxel 80 mg/m2 Weekly:   Administer weekly:     Paclitaxel   **Always confirm dose/schedule in your pharmacy ordering system**    Patient Characteristics: Preoperative or Nonsurgical Candidate (Clinical Staging), Neoadjuvant Therapy followed by Surgery, Invasive Disease, Chemotherapy, HER2 Negative/Unknown/Equivocal, ER Positive Therapeutic Status: Preoperative or Nonsurgical Candidate (Clinical Staging) AJCC M Category: cM0 AJCC Grade: G3 Breast Surgical Plan: Neoadjuvant Therapy followed by Surgery ER Status: Positive (+) AJCC 8 Stage Grouping: IIIA HER2 Status: Negative (-) AJCC T Category: cT2 AJCC N Category: cN1 PR Status: Negative (-) Intent of Therapy: Curative Intent, Discussed with Patient 

## 2017-07-16 MED ORDER — ONDANSETRON HCL 8 MG PO TABS: 8.0000 mg | ORAL_TABLET | Freq: Two times a day (BID) | ORAL | 1 refills | Status: DC | PRN

## 2017-07-16 MED ORDER — PROCHLORPERAZINE MALEATE 10 MG PO TABS: 10.0000 mg | ORAL_TABLET | Freq: Four times a day (QID) | ORAL | 1 refills | Status: DC | PRN

## 2017-07-16 MED ORDER — LIDOCAINE-PRILOCAINE 2.5-2.5 % EX CREA: TOPICAL_CREAM | CUTANEOUS | 3 refills | Status: DC

## 2017-07-16 NOTE — Patient Instructions (Addendum)
[place]   CHEMOTHERAPY INSTRUCTIONS  You have been diagnosed with Stage 2 ER + breast cancer.   We are going to treat you with 4 cycles (1 cycle is 2 weeks) of Adriamycin and Cytoxan with Udenyca (neulasta) support. Then you will have 12 weekly cycles of Taxol.  This treatment is with curative intent.  You will see the doctor regularly throughout treatment.  We monitor your lab work prior to every treatment.  The doctor monitors your response to treatment by the way you are feeling, your blood work, and scans periodically.  There will be wait times while you are here for treatment.  It will take about 30 minutes to 1 hour for your lab work to result.  Then pharmacy will have to mix your medications for your treatment.   You will receive the following premedications prior to each chemotherapy treatment of Adriamycin and Cytoxan: Premeds: Aloxi - high powered nausea/vomiting prevention medication used for chemotherapy patients. Emend - high powered nausea/vomiting prevention medication used for chemotherapy patients. Dexamethasone - steroid - given to reduce the risk of you having an allergic type reaction to the chemotherapy. Dex can cause you to feel energized, nervous/anxious/jittery, make you have trouble sleeping, and/or make you feel hot/flushed in the face/neck and/or look pink/red in the face/neck. These side effects will pass as the Dex wears off. (takes 20 minutes to infuse)   You will receive the the following premedications prior to each of the Taxol treatments: Premeds: Benadryl: helps prevent reaction to chemotherapy.  Pepcid: an antihistamine that is used to reduce indigestion and heartburn, which can feel like and sometimes lead to nausea and vomiting. Dexamethasone - steroid - given to reduce the risk of you having an allergic type reaction to the chemotherapy. Dex can cause you to feel energized, nervous/anxious/jittery, make you have trouble  sleeping, and/or make you feel hot/flushed in the face/neck and/or look pink/red in the face/neck. These side effects will pass as the Dex wears off. (takes 30 minutes to infuse)  You will also receive an injection called Udenyca (neulasta) after each Adriamycin and Cytoxan treatment (only for the first 4 cycles):  Udenyca (Neulasta) - this medication is not chemo but being given because you have had chemo. It is usually given 24-27 hours after the completion of chemotherapy. This medication works by boosting your bone marrow's supply of white blood cells. White blood cells are what protect our bodies against infection. The medication is given in the form of a subcutaneous injection. It is given in the fatty tissue of your abdomen. It is a short needle. The major side effect of this medication is bone or muscle pain. The drug of choice to relieve or lessen the pain is Aleve or Ibuprofen. If a physician has ever told you not to take Aleve or Ibuprofen - then don't take it. You should then take Tylenol/acetaminophen. Take either medication as the bottle directs you to.  The level of pain you experience as a result of this injection can range from none, to mild or moderate, or severe. Please let us know if you develop moderate or severe bone pain.   You can take Claritin 10 mg over the counter for a few days after receiving neulasta to help with the bone aches and pains.      POTENTIAL SIDE EFFECTS OF TREATMENT: Cyclophosphamide (Generic Name) Other Names: Cytoxan, Neosar  About This Drug Cyclophosphamide is a drug used to treat cancer. It is  given in the vein (IV) or by mouth.  Takes 30 minutes for this drug to infuse.     Possible Side Effects (More Common) . Nausea and throwing up (vomiting). These symptoms may happen within a few hours after your treatment and may last up to 72 hours. Medicines are available to stop or lessen these side effects. . Bone marrow depression. This is a decrease in the  number of white blood cells, red blood cells, and platelets. This may raise your risk of infection, make you tired and weak (fatigue), and raise your risk of bleeding. . Hair loss: You may notice hair getting thin. Some patients lose their hair. Hair loss is often complete scalp hair loss and can involve loss of eyebrows, eyelashes, and pubic hair. You may notice this a few days or weeks after treatment has started. Most often hair loss is temporary; your hair should grow back when treatment is done. . Decreased appetite (decreased hunger) . Blurred vision . Soreness of the mouth and throat. You may have red areas, white patches, or sores that hurt. . Effects on the bladder. This drug may cause irritation and bleeding in the bladder. You may have blood in your urine. To help stop this, you will get extra fluids to help you pass more urine. You may get a drug called mesna, which helps to decrease irritation and bleeding. You may also get a medicine to help you pass more urine. You may have a catheter (tube) placed in your bladder so that your bladder will be washed with this drug.  Possible Side Effects (Less Common) . Darkening of the skin or nails . Metallic taste in the mouth . Changes in lung tissue may happen with large amounts of this drug. These changes may not last forever, and your lung tissue may go back to normal. Sometimes these changes may not be seen for many years. You may get a cough or have trouble catching your breath.  Allergic Reactions   Serious allergic reactions including anaphylaxis are rare. While you are getting this drug in your vein (IV), tell your nurse right away if you have any of these symptoms of an allergic reaction: . Trouble catching your breath . Feeling like your tongue or throat are swelling . Feeling your heart beat quickly or in a not normal way (palpitations) . Feeling dizzy or lightheaded . Flushing, itching, rash, and/or hives  Treating Side Effects .  Drink 6-8 cups of fluids each day unless your doctor has told you to limit your fluid intake due to some other health problem. A cup is 8 ounces of fluid. If you throw up or have loose bowel movements you should drink more fluids so that you do not become dehydrated (lack water in the body due to losing too much fluid). . Ask your doctor or nurse about medicine that is available to help stop or lessen nausea or throwing up. . Mouth care is very important. Your mouth care should consist of routine, gentle cleaning of your teeth or dentures and rinsing your mouth with a mixture of 1/2 teaspoon of salt in 8 ounces of water or  teaspoon of baking soda in 8 ounces of water. This should be done at least after each meal and at bedtime. . If you have mouth sores, avoid mouthwash that has alcohol. Also avoid alcohol and smoking because they can bother your mouth and throat. . Talk with your nurse about getting a wig before you lose your hair.  Also, call the [place] at 800-ACS-2345 to find out information about the " Look Good.Marland KitchenMarland KitchenFeel Better" program close to where you live. It is a free program where women undergoing chemotherapy learn about wigs, turbans and scarves as well as makeup techniques and skin and nail care.  Important Information . Whenever you tell a doctor or nurse your health history, always tell them that you have received cyclophosphamide in the past. . If you take this drug by mouth swallow the medicine whole. Do not chew, break or crush it. . You can take the medicine with or without food. If you have nausea, take it with food. Do not take the pills at bedtime.  Food and Drug Interactions There are no known interactions of cyclophosphamide with food. This drug may interact with other medicines. Tell your doctor and pharmacist about all the medicines and dietary supplements (vitamins, minerals, herbs and others) that you are taking at this time. The safety and use of dietary  supplements and alternative diets are often not known. Using these might affect your cancer or interfere with your treatment. Until more is known, you should not use dietary supplements or alternative diets without your cancer doctor's help.  When to Call the Doctor Call your doctor or nurse right away if you have any of these symptoms: . Fever of 100.5 F (38 C) or higher . Chills . Bleeding or bruising that is not normal . Blurred vision or other changes in eyesight . Pain when passing urine; blood in urine . Pain in your lower back or side . Wheezing or trouble breathing . Swelling of legs, ankles, or feet . Feeling dizzy or lightheaded . Feeling confused or agitated . Signs of liver problems: dark urine, pale bowel movements, bad stomach pain, feeling very tired and weak, unusual itching, or yellowing of the eyes or skin . Unusual thirst or passing urine often . Nausea that stops you from eating or drinking . Throwing up more than 3 times a day  Call your doctor or nurse as soon as possible if any of these symptoms happen: . Pain in your mouth or throat that makes it hard to eat or drink . Nausea not relieved by prescribed medicines  Sexual Problems and Reproductive Concerns . Infertility warning: Sexual problems and reproduction concerns may happen. In both men and women, this drug may affect your ability to have children. This cannot be determined before your treatment. Talk with your doctor or nurse if you plan to have children. Ask for information on sperm or egg banking. . In men, this drug may interfere with your ability to make sperm, but it should not change your ability to have sexual relations. . In women, menstrual bleeding may become irregular or stop while you are getting this drug. Do not assume that you cannot become pregnant if you do not have a menstrual period. . Women may go through signs of menopause (change of life) like vaginal dryness or itching. Vaginal  lubricants can be used to lessen vaginal dryness, itching, and pain during sexual relations. . Genetic counseling is available for you to talk about the effects of this drug therapy on future pregnancies. Also, a genetic counselor can look at the possible risk of problems in the unborn baby due to this medicine if an exposure happens during pregnancy. . Pregnancy warning: This drug may have harmful effects on the unborn child, so effective methods of birth control should be used during your cancer treatment. . Breast  feeding warning: Women should not breast feed during treatment because this drug could enter the breast milk and badly harm a breast feeding baby   Doxorubicin (Generic Name) Other Names: Adriamycin, hydroxyl daunorubicin  About This Drug Doxorubicin is a drug used to treat cancer. This drug is given in the vein (IV).  This drug is an IV push over about 5-10 minutes.    Possible Side Effects (More Common) . Bone marrow depression. This is a decrease in the number of white blood cells, red blood cells, and platelets. This may raise your risk of infection, make you tired and weak (fatigue), and raise your risk of bleeding. . Hair loss: Hair loss is often complete scalp hair loss and can involve loss of eyebrows, eyelashes, and pubic hair. You may notice this a few days or weeks after treatment has started. Most often hair loss is temporary; your hair should grow back when treatment is done. . Nausea and throwing up (vomiting). These symptoms may happen within a few hours after your treatment and may last up to 24 hours. Medicines are available to stop or lessen these side effects. . Soreness of the mouth and throat. You may have red areas, white patches, or sores that hurt. . Change in the color of your urine to pink or red. This color change will go away in one to two days. . Effects on the heart: This drug can weaken the heart and lower heart function. Your heart function will be  checked as needed. You may have trouble catching your breath, mainly during activities. You may also have trouble breathing while lying down, and have swelling in your ankles. . Sensitivity to light (photosensitivity). Photosensitivity means that you may become more sensitive to the effects of the sun, sun lamps, and tanning beds. Your eyes may water more, mostly in bright light. . Metallic taste in the mouth: This may change the taste of food and drinks . Decreased appetite (decreased hunger) . Darkening of the skin or nails . Weakness that interferes with your daily activities  Possible Side Effects (Less Common) . Skin and tissue irritation may involve redness, pain, warmth, or swelling at the IV site. This happens if the drug leaks out of the vein and into nearby tissue. . Changes in your liver function. Your doctor will check your liver function as needed. . This drug may cause an increased risk of developing a second cancer  Allergic Reaction Serious allergic reactions, including anaphylaxis are rare. While you are getting this drug in your vein (IV), tell your nurse right away if you have any of these symptoms of an allergic reaction: . Trouble catching your breath . Feeling like your tongue or throat are swelling . Feeling your heart beat quickly or in a not normal way (palpitations) . Feeling dizzy or lightheaded . Flushing, itching, rash, and/or hives  Treating Side Effects . Drink 6-8 cups of fluids every day unless your doctor has told you to limit your fluid intake due to some other health problem. A cup is 8 ounces of fluid. If you vomit or have diarrhea, you should drink more fluids so that you do not become dehydrated (lack water in the body due to losing too much fluid). . Ask your doctor or nurse about medicine that is available to help stop or lessen nausea, throwing up, and/or loose bowel movements . Wear dark sunglasses and use sunscreen with SPF 30 or higher when you are  outdoors even for a short  time. Cover up when you are out in the sun. Wear wide-brimmed hats, long-sleeved shirts, and pants. Keep your neck, chest, and back covered. . Mouth care is very important. Your mouth care should consist of routine, gentle cleaning of your teeth or dentures and rinsing your mouth with a mixture of 1/2 teaspoon of salt in 8 ounces of water or  teaspoon of baking soda in 8 ounces of water. This should be done at least after each meal and at bedtime. . If you have mouth sores, avoid mouthwash that has alcohol. Avoid alcohol and smoking because they can bother your mouth and throat. . Talk with your nurse about getting a wig before you lose your hair. Also, call the [place] at 800-ACS-2345 to find out information about the "Look Good, Feel Better" program close to where you live. It is a free program where women getting chemotherapy can learn about wigs, turbans and scarves as well as makeup techniques and skin and nail care. . While you are getting this drug, please tell your nurse right away if you have any pain, redness, or swelling at the site of the IV infusion.  Food and Drug Interactions There are no known interactions of doxorubicin with food. This drug may interact with other medicines. Tell your doctor and pharmacist about all the medicines and dietary supplements (vitamins, minerals, herbs and others) that you are taking at this time. The safety and use of dietary supplements and alternative diets are often not known. Using these might affect your cancer or interfere with your treatment. Until more is known, you should not use dietary supplements or alternative diets without your cancer doctor's help.  When to Call the Doctor Call your doctor or nurse right away if you have any of these symptoms: . Fever of 100.5 F (38 C) or above . Chills . Easy bruising or bleeding . Wheezing or trouble breathing . Rash or itching . Feeling dizzy or lightheaded .  Feeling that your heart is beating in a fast or not normal way (palpitations) . Loose bowel movements (diarrhea) more than 4 times a day or diarrhea with weakness or feeling lightheaded . Nausea that stops you from eating or drinking . Throwing up more than 3 times a day . Signs of liver problems: dark urine, pale bowel movements, bad stomach pain, feeling very tired and weak, unusual itching, or yellowing of the eyes or skin, . During the IV infusion, if you have pain, redness, or swelling at the site of the IV infusion, please tell your nurse right away  Call your doctor or nurse as soon as possible if any of these symptoms happen: . Decreased urine . Pain in your mouth or throat that makes it hard to eat or drink . Nausea and throwing up that is not relieved by prescribed medicines . Rash that is not relieved by prescribed medicines . Swelling of legs, ankles, or feet . Weight gain of 5 pounds in one week (fluid retention) . Lasting loss of appetite or rapid weight loss of five pounds in a week . Fatigue that interferes with your daily activities . Extreme weakness that interferes with normal activities   Sexual Problems and Reproduction Concerns . Infertility warning: Sexual problems and reproduction concerns may happen. In both men and women, this drug may affect your ability to have children. This cannot be determined before your treatment. Talk with your doctor or nurse if you plan to have children. Ask for information on sperm  or egg banking. . In men, this drug may interfere with your ability to make sperm, but it should not change your ability to have sexual relations. . In women, menstrual bleeding may become irregular or stop while you are getting this drug. Do not assume that you cannot become pregnant if you do not have a menstrual period.    Paclitaxel (Taxol)  About This Drug Paclitaxel is a drug used to treat cancer. It is given in the vein (IV).  This will take 1 hour  to infuse.  This first infusion will take longer to infuse because it is increased slowly to monitor for reactions.  The nurse will be in the room with you for the first 15 minutes of the first infusion.  Possible Side Effects . Hair loss. Hair loss is often temporary, although with certain medicine, hair loss can sometimes be permanent. Hair loss may happen suddenly or gradually. If you lose hair, you may lose it from your head, face, armpits, pubic area, chest, and/or legs. You may also notice your hair getting thin. . Swelling of your legs, ankles and/or feet (edema) . Flushing . Nausea and throwing up (vomiting) . Loose bowel movements (diarrhea) . Bone marrow depression. This is a decrease in the number of white blood cells, red blood cells, and platelets. This may raise your risk of infection, make you tired and weak (fatigue), and raise your risk of bleeding. . Effects on the nerves are called peripheral neuropathy. You may feel numbness, tingling, or pain in your hands and feet. It may be hard for you to button your clothes, open jars, or walk as usual. The effect on the nerves may get worse with more doses of the drug. These effects get better in some people after the drug is stopped but it does not get better in all people. . Changes in your liver function . Bone, joint and muscle pain . Abnormal EKG . Allergic reaction: Allergic reactions, including anaphylaxis are rare but may happen in some patients. Signs of allergic reaction to this drug may be swelling of the face, feeling like your tongue or throat are swelling, trouble breathing, rash, itching, fever, chills, feeling dizzy, and/or feeling that your heart is beating in a fast or not normal way. If this happens, do not take another dose of this drug. You should get urgent medical treatment. . Infection . Changes in your kidney function. Note: Each of the side effects above was reported in 20% or greater of patients treated with  paclitaxel. Not all possible side effects are included above.  Warnings and Precautions . Severe allergic reactions . Severe bone marrow depression  Treating Side Effects . To help with hair loss, wash with a mild shampoo and avoid washing your hair every day. . Avoid rubbing your scalp, instead, pat your hair or scalp dry . Avoid coloring your hair . Limit your use of hair spray, electric curlers, blow dryers, and curling irons. . If you are interested in getting a wig, talk to your nurse. You can also call the [place] at 800-ACS-2345 to find out information about the "Look Good, Feel Better" program close to where you live. It is a free program where women getting chemotherapy can learn about wigs, turbans and scarves as well as makeup techniques and skin and nail care. . Ask your doctor or nurse about medicines that are available to help stop or lessen diarrhea and/or nausea. . To help with nausea and vomiting, eat  small, frequent meals instead of three large meals a day. Choose foods and drinks that are at room temperature. Ask your nurse or doctor about other helpful tips and medicine that is available to help or stop lessen these symptoms. . If you get diarrhea, eat low-fiber foods that are high in protein and calories and avoid foods that can irritate your digestive tracts or lead to cramping. Ask your nurse or doctor about medicine that can lessen or stop your diarrhea. . Mouth care is very important. Your mouth care should consist of routine, gentle cleaning of your teeth or dentures and rinsing your mouth with a mixture of 1/2 teaspoon of salt in 8 ounces of water or  teaspoon of baking soda in 8 ounces of water. This should be done at least after each meal and at bedtime. . If you have mouth sores, avoid mouthwash that has alcohol. Also avoid alcohol and smoking because they can bother your mouth and throat. . Drink plenty of fluids (a minimum of eight glasses per day is  recommended). . Take your temperature as your doctor or nurse tells you, and whenever you feel like you may have a fever. . Talk to your doctor or nurse about precautions you can take to avoid infections and bleeding. . Be careful when cooking, walking, and handling sharp objects and hot liquids.  Food and Drug Interactions . There are no known interactions of paclitaxel with food. . This drug may interact with other medicines. Tell your doctor and pharmacist about all the medicines and dietary supplements (vitamins, minerals, herbs and others) that you are taking at this time. . The safety and use of dietary supplements and alternative diets are often not known. Using these might affect your cancer or interfere with your treatment. Until more is known, you should not use dietary supplements or alternative diets without your cancer doctor's help.  When to Call the Doctor Call your doctor or nurse if you have any of the following symptoms and/or any new or unusual symptoms: . Fever of 100.5 F (38 C) or above . Chills . Redness, pain, warmth, or swelling at the IV site during the infusion . Signs of allergic reaction: swelling of the face, feeling like your tongue or throat are swelling, trouble breathing, rash, itching, fever, chills, feeling dizzy, and/or feeling that your heart is beating in a fast or not normal way . Feeling that your heart is beating in a fast or not normal way (palpitations) . Weight gain of 5 pounds in one week (fluid retention) . Decreased urine or very dark urine . Signs of liver problems: dark urine, pale bowel movements, bad stomach pain, feeling very tired and weak, unusual itching, or yellowing of the eyes or skin . Heavy menstrual period that lasts longer than normal . Easy bruising or bleeding . Nausea that stops you from eating or drinking, and/or that is not relieved by prescribed medicines. . Loose bowel movements (diarrhea) more than 4 times a day or diarrhea  with weakness or lightheadedness . Pain in your mouth or throat that makes it hard to eat or drink . Lasting loss of appetite or rapid weight loss of five pounds in a week . Signs of peripheral neuropathy: numbness, tingling, or decreased feeling in fingers or toes; trouble walking or changes in the way you walk; or feeling clumsy when buttoning clothes, opening jars, or other routine activities . Joint and muscle pain that is not relieved by prescribed medicines . Extreme fatigue  that interferes with normal activities . While you are getting this drug, please tell your nurse right away if you have any pain, redness, or swelling at the site of the IV infusion. . If you think you are pregnant.  Reproduction Warnings . Pregnancy warning: This drug may have harmful effects on the unborn child, it is recommended that effective methods of birth control should be used during your cancer treatment. Let your doctor know right away if you think you may be pregnant. . Breast feeding warning: Women should not breast feed during treatment because this drug could enter the breastmilk and cause harm to a breast feeding baby.    SELF CARE ACTIVITIES WHILE ON CHEMOTHERAPY: Hydration Increase your fluid intake 48 hours prior to treatment and drink at least 8 to 12 cups (64 ounces) of water/decaff beverages per day after treatment. You can still have your cup of coffee or soda but these beverages do not count as part of your 8 to 12 cups that you need to drink daily. No alcohol intake.  Medications Continue taking your normal prescription medication as prescribed.  If you start any new herbal or new supplements please let us know first to make sure it is safe.    Mouth Care Have teeth cleaned professionally before starting treatment. Keep dentures and partial plates clean. Use soft toothbrush and do not use mouthwashes that contain alcohol. Biotene is a good mouthwash that is available at most pharmacies or  may be ordered by calling [phone]. Use warm salt water gargles (1 teaspoon salt per 1 quart warm water) before and after meals and at bedtime. Or you may rinse with 2 tablespoons of three-percent hydrogen peroxide mixed in eight ounces of water. If you are still having problems with your mouth or sores in your mouth please call the clinic. If you need dental work, please let the doctor know before you go for your appointment so that we can coordinate the best possible time for you in regards to your chemo regimen. You need to also let your dentist know that you are actively taking chemo. We may need to do labs prior to your dental appointment.  Skin Care Always use sunscreen that has not expired and with SPF (Sun Protection Factor) of 50 or higher. Wear hats to protect your head from the sun. Remember to use sunscreen on your hands, ears, face, & feet.  Use good moisturizing lotions such as udder cream, eucerin, or even Vaseline. Some chemotherapies can cause dry skin, color changes in your skin and nails.    . Avoid long, hot showers or baths. . Use gentle, fragrance-free soaps and laundry detergent. . Use moisturizers, preferably creams or ointments rather than lotions because the thicker consistency is better at preventing skin dehydration. Apply the cream or ointment within 15 minutes of showering. Reapply moisturizer at night, and moisturize your hands every time after you wash them.  Hair Loss (if your doctor says your hair will fall out)  . If your doctor says that your hair is likely to fall out, decide before you begin chemo whether you want to wear a wig. You may want to shop before treatment to match your hair color. . Hats, turbans, and scarves can also camouflage hair loss, although some people prefer to leave their heads uncovered. If you go bare-headed outdoors, be sure to use sunscreen on your scalp. . Cut your hair short. It eases the inconvenience of shedding lots of hair, but  it  also can reduce the emotional impact of watching your hair fall out. . Don't perm or color your hair during chemotherapy. Those chemical treatments are already damaging to hair and can enhance hair loss. Once your chemo treatments are done and your hair has grown back, it's OK to resume dyeing or perming hair. With chemotherapy, hair loss is almost always temporary. But when it grows back, it may be a different color or texture. In older adults who still had hair color before chemotherapy, the new growth may be completely gray.  Often, new hair is very fine and soft.  Infection Prevention Please wash your hands for at least 30 seconds using warm soapy water. Handwashing is the #1 way to prevent the spread of germs. Stay away from sick people or people who are getting over a cold. If you develop respiratory systems such as green/yellow mucus production or productive cough or persistent cough let us know and we will see if you need an antibiotic. It is a good idea to keep a pair of gloves on when going into grocery stores/[employer] to decrease your risk of coming into contact with germs on the carts, etc. Carry alcohol hand gel with you at all times and use it frequently if out in public. If your temperature reaches 100.5 or higher please call the clinic and let us know.  If it is after hours or on the weekend please go to the ER if your temperature is over 100.5.  Please have your own personal thermometer at home to use.    Sex and bodily fluids If you are going to have sex, a condom must be used to protect the person that isn't taking chemotherapy. Chemo can decrease your libido (sex drive). For a few days after chemotherapy, chemotherapy can be excreted through your bodily fluids.  When using the toilet please close the lid and flush the toilet twice.  Do this for a few day after you have had chemotherapy.     Effects of chemotherapy on your sex life Some changes are simple and won't last long. They  won't affect your sex life permanently. Sometimes you may feel: . too tired . not strong enough to be very active . sick or sore  . not in the mood . anxious or low Your anxiety might not seem related to sex. For example, you may be worried about the cancer and how your treatment is going. Or you may be worried about money, or about how you family are coping with your illness. These things can cause stress, which can affect your interest in sex. It's important to talk to your partner about how you feel. Remember - the changes to your sex life don't usually last long. There's usually no medical reason to stop having sex during chemo. The drugs won't have any long term physical effects on your performance or enjoyment of sex. Cancer can't be passed on to your partner during sex  Contraception It's important to use reliable contraception during treatment. Avoid getting pregnant while you or your partner are having chemotherapy. This is because the drugs may harm the baby. Sometimes chemotherapy drugs can leave a man or woman infertile.  This means you would not be able to have children in the future. You might want to talk to someone about permanent infertility. It can be very difficult to learn that you may no longer be able to have children. Some people find counselling helpful. There might be ways to preserve your fertility,  although this is easier for men than for women. You may want to speak to a fertility expert. You can talk about sperm banking or harvesting your eggs. You can also ask about other fertility options, such as donor eggs. If you have or have had breast cancer, your doctor might advise you not to take the contraceptive pill. This is because the hormones in it might affect the cancer.  It is not known for sure whether or not chemotherapy drugs can be passed on through semen or secretions from the vagina. Because of this some doctors advise people to use a barrier method if you have sex  during treatment. This applies to vaginal, anal or oral sex. Generally, doctors advise a barrier method only for the time you are actually having the treatment and for about a week after your treatment. Advice like this can be worrying, but this does not mean that you have to avoid being intimate with your partner. You can still have close contact with your partner and continue to enjoy sex.  Animals If you have cats or birds we just ask that you not change the litter or change the cage.  Please have someone else do this for you while you are on chemotherapy.   Food Safety During and After Cancer Treatment Food safety is important for people both during and after cancer treatment. Cancer and cancer treatments, such as chemotherapy, radiation therapy, and stem cell/bone marrow transplantation, often weaken the immune system. This makes it harder for your body to protect itself from foodborne illness, also called food poisoning. Foodborne illness is caused by eating food that contains harmful bacteria, parasites, or viruses.  Foods to avoid Some foods have a higher risk of becoming tainted with bacteria. These include: Marland Kitchen Unwashed fresh fruit and vegetables, especially leafy vegetables that can hide dirt and other contaminants . Raw sprouts, such as alfalfa sprouts . Raw or undercooked beef, especially ground beef, or other raw or undercooked meat and poultry . Fatty, fried, or spicy foods immediately before or after treatment.  These can sit heavy on your stomach and make you feel nauseous. . Raw or undercooked shellfish, such as oysters. . Sushi and sashimi, which often contain raw fish.  . Unpasteurized beverages, such as unpasteurized fruit juices, raw milk, raw yogurt, or cider . Undercooked eggs, such as soft boiled, over easy, and poached; raw, unpasteurized eggs; or foods made with raw egg, such as homemade raw cookie dough and homemade mayonnaise Simple steps for food safety Shop  smart. . Do not buy food stored or displayed in an unclean area. . Do not buy bruised or damaged fruits or vegetables. . Do not buy cans that have cracks, dents, or bulges. . Pick up foods that can spoil at the end of your shopping trip and store them in a cooler on the way home. Prepare and clean up foods carefully. . Rinse all fresh fruits and vegetables under running water, and dry them with a clean towel or paper towel. . Clean the top of cans before opening them. . After preparing food, wash your hands for 20 seconds with hot water and soap. Pay special attention to areas between fingers and under nails. . Clean your utensils and dishes with hot water and soap. Marland Kitchen Disinfect your kitchen and cutting boards using 1 teaspoon of liquid, unscented bleach mixed into 1 quart of water.   Dispose of old food. . Eat canned and packaged food before its expiration date (the "use  by" or "best before" date). . Consume refrigerated leftovers within 3 to 4 days. After that time, throw out the food. Even if the food does not smell or look spoiled, it still may be unsafe. Some bacteria, such as Listeria, can grow even on foods stored in the refrigerator if they are kept for too long. Take precautions when eating out. . At restaurants, avoid buffets and salad bars where food sits out for a long time and comes in contact with many people. Food can become contaminated when someone with a virus, often a norovirus, or another "bug" handles it. . Put any leftover food in a "to-go" container yourself, rather than having the server do it. And, refrigerate leftovers as soon as you get home. . Choose restaurants that are clean and that are willing to prepare your food as you order it cooked.    MEDICATIONS:                                                                                             Zofran/Ondansetron 8mg  tablet. Take 1 tablet (8 mg total) by mouth 2 (two) times daily as needed. Start on the third day  after chemotherapy. (#1 nausea med to take, this can constipate)  Compazine/Prochlorperazine 10mg  tablet. Take 1 tablet every 6 hours as needed for nausea/vomiting. (#2 nausea med to take, this can make you sleepy)  EMLA cream. Apply a quarter size amount to port site 1 hour prior to chemo. Do not rub in. Cover with plastic wrap.   Over-the-Counter Meds: Miralax 1 capful in 8 oz of fluid daily. May increase to two times a day if needed. This is a stool softener. If this doesn't work proceed you can add:  Senokot S-start with 1 tablet two times a day and increase to 4 tablets two times a day if needed. (total of 8 tablets in a 24 hour period). This is a stimulant laxative.   Call us if this does not help your bowels move.   Imodium 2mg  capsule. Take 2 capsules after the 1st loose stool and then 1 capsule every 2 hours until you go a total of 12 hours without having a loose stool. Call the [facility] if loose stools continue. If diarrhea occurs @ bedtime, take 2 capsules @ bedtime. Then take 2 capsules every 4 hours until morning. Call [facility].   Diarrhea Sheet  If you are having loose stools/diarrhea, please purchase Imodium and begin taking as outlined:  At the first sign of poorly formed or loose stools you should begin taking Imodium(loperamide) 2 mg capsules.  Take two caplets (4mg ) followed by one caplet (2mg ) every 2 hours until you have had no diarrhea for 12 hours.  During the night take two caplets (4mg ) at bedtime and continue every 4 hours during the night until the morning.  Stop taking Imodium only after there is no sign of diarrhea for 12 hours.   Always call the [facility] if you are having loose stools/diarrhea that you can't get under control.  Loose stools/diarrhea leads to dehydration (loss of water) in your body.  We have other  options of trying to get the loose stools/diarrhea to stopped but you must let us know!   Constipation Sheet *Miralax in 8 oz of fluid  daily.  May increase to two times a day if needed.  This is a stool softener.  If this not enough to keep your bowel regular:  You can add:  *Senokot S, start with one tablet twice a day and can increase to 4 tablets twice a day if needed.  This is a stimulant laxative.   Sometimes when you take pain medication you need BOTH a medicine to keep your stool soft and a medicine to help your bowel push it out!  Please call if the above does not work for you.   Do not go more than 2 days without a bowel movement.  It is very important that you do not become constipated.  It will make you feel sick to your stomach (nausea) and can cause abdominal pain and vomiting.    Nausea Sheet  Zofran/Ondansetron 8mg  tablet.  Take 1 tablet (8 mg total) by mouth 2 (two) times daily as needed. Start on the third day after chemotherapy. (#1 nausea med to take, this can constipate)  Compazine/Prochlorperazine 10mg  tablet. Take 1 tablet every 6 hours as needed for nausea/vomiting. (#2 nausea med to take, this can make you sleepy)  You can take these medications together or separately.  We would first like for you to try the Ondansetron by itself and then take the Prochloperizine if needed. But you are allowed to take both medications at the same time if your nausea is that severe.  If you are having persistent nausea (nausea that does not stop) please take these medications on a staggered schedule so that the nausea medication stays in your body.  Please call the [facility] and let us know the amount of nausea that you are experiencing.  If you begin to vomit, you need to call the [facility] and if it is the weekend and you have vomited more than one time and cant get it to stop-go to the Emergency Room.  Persistent nausea/vomiting can lead to dehydration (loss of fluid in your body) and will make you feel terrible.   Ice chips, sips of clear liquids, foods that are @ room temperature, crackers, and toast tend to  be better tolerated.   SYMPTOMS TO REPORT AS SOON AS POSSIBLE AFTER TREATMENT:  FEVER GREATER THAN 100.5 F  CHILLS WITH OR WITHOUT FEVER  NAUSEA AND VOMITING THAT IS NOT CONTROLLED WITH YOUR NAUSEA MEDICATION  UNUSUAL SHORTNESS OF BREATH  UNUSUAL BRUISING OR BLEEDING  TENDERNESS IN MOUTH AND THROAT WITH OR WITHOUT PRESENCE OF ULCERS  URINARY PROBLEMS  BOWEL PROBLEMS  UNUSUAL RASH     Wear comfortable clothing and clothing appropriate for easy access to any Portacath or PICC line. Let us know if there is anything that we can do to make your therapy better!    What to do if you need assistance after hours or on the weekends: CALL [phone].  HOLD on the line, do not hang up.  You will hear multiple messages but at the end you will be connected with a nurse triage line.  They will contact the doctor if necessary.  Most of the time they will be able to assist you.  Do not call the hospital operator.      I have been informed and understand all of the instructions given to me and have received a copy. I  have been instructed to call the clinic [phone] or my family physician as soon as possible for continued medical care, if indicated. I do not have any more questions at this time but understand that I may call the [facility] or the Patient Navigator at [phone] during office hours should I have questions or need assistance in obtaining follow-up care.

## 2017-07-18 ENCOUNTER — Inpatient Hospital Stay (HOSPITAL_COMMUNITY): Payer: Medicaid Other

## 2017-07-18 ENCOUNTER — Encounter (HOSPITAL_COMMUNITY): Payer: Self-pay

## 2017-07-18 ENCOUNTER — Other Ambulatory Visit: Payer: Self-pay

## 2017-07-18 VITALS — BP 121/54 | HR 57 | Temp 98.0°F | Resp 18 | Wt 163.8 lb

## 2017-07-18 DIAGNOSIS — Z170 Estrogen receptor positive status [ER+]: Principal | ICD-10-CM

## 2017-07-18 DIAGNOSIS — C50412 Malignant neoplasm of upper-outer quadrant of left female breast: Secondary | ICD-10-CM

## 2017-07-18 DIAGNOSIS — Z5111 Encounter for antineoplastic chemotherapy: Secondary | ICD-10-CM | POA: Diagnosis not present

## 2017-07-18 LAB — COMPREHENSIVE METABOLIC PANEL
ALBUMIN: 3.8 g/dL (ref 3.5–5.0)
ALT: 17 U/L (ref 14–54)
AST: 22 U/L (ref 15–41)
Alkaline Phosphatase: 59 U/L (ref 38–126)
Anion gap: 11 (ref 5–15)
BILIRUBIN TOTAL: 0.5 mg/dL (ref 0.3–1.2)
BUN: 25 mg/dL — AB (ref 6–20)
CO2: 24 mmol/L (ref 22–32)
CREATININE: 1.14 mg/dL — AB (ref 0.44–1.00)
Calcium: 9.3 mg/dL (ref 8.9–10.3)
Chloride: 99 mmol/L — ABNORMAL LOW (ref 101–111)
GFR calc Af Amer: 58 mL/min — ABNORMAL LOW (ref >60)
GFR, EST NON AFRICAN AMERICAN: 50 mL/min — AB (ref >60)
GLUCOSE: 152 mg/dL — AB (ref 65–99)
POTASSIUM: 4.1 mmol/L (ref 3.5–5.1)
Sodium: 134 mmol/L — ABNORMAL LOW (ref 135–145)
TOTAL PROTEIN: 8.1 g/dL (ref 6.5–8.1)

## 2017-07-18 LAB — CBC WITH DIFFERENTIAL/PLATELET
BASOS ABS: 0 10*3/uL (ref 0.0–0.1)
Basophils Relative: 0 %
EOS ABS: 0.2 10*3/uL (ref 0.0–0.7)
EOS PCT: 2 %
HCT: 38.5 % (ref 36.0–46.0)
Hemoglobin: 12.9 g/dL (ref 12.0–15.0)
LYMPHS ABS: 3.4 10*3/uL (ref 0.7–4.0)
LYMPHS PCT: 36 %
MCH: 29.3 pg (ref 26.0–34.0)
MCHC: 33.5 g/dL (ref 30.0–36.0)
MCV: 87.3 fL (ref 78.0–100.0)
MONO ABS: 0.7 10*3/uL (ref 0.1–1.0)
Monocytes Relative: 7 %
Neutro Abs: 5.1 10*3/uL (ref 1.7–7.7)
Neutrophils Relative %: 55 %
PLATELETS: 252 10*3/uL (ref 150–400)
RBC: 4.41 MIL/uL (ref 3.87–5.11)
RDW: 12.1 % (ref 11.5–15.5)
WBC: 9.4 10*3/uL (ref 4.0–10.5)

## 2017-07-18 MED ORDER — FOSAPREPITANT DIMEGLUMINE INJECTION 150 MG
Freq: Once | INTRAVENOUS | Status: AC
  Administered 2017-07-18: 10:00:00 via INTRAVENOUS
  Filled 2017-07-18: qty 5

## 2017-07-18 MED ORDER — HEPARIN SOD (PORK) LOCK FLUSH 100 UNIT/ML IV SOLN
500.0000 [IU] | Freq: Once | INTRAVENOUS | Status: AC | PRN
  Administered 2017-07-18: 500 [IU]
  Filled 2017-07-18: qty 5

## 2017-07-18 MED ORDER — SODIUM CHLORIDE 0.9 % IV SOLN
Freq: Once | INTRAVENOUS | Status: AC
  Administered 2017-07-18: 10:00:00 via INTRAVENOUS

## 2017-07-18 MED ORDER — PALONOSETRON HCL INJECTION 0.25 MG/5ML
0.2500 mg | Freq: Once | INTRAVENOUS | Status: AC
  Administered 2017-07-18: 0.25 mg via INTRAVENOUS

## 2017-07-18 MED ORDER — PALONOSETRON HCL INJECTION 0.25 MG/5ML
INTRAVENOUS | Status: AC
  Filled 2017-07-18: qty 5

## 2017-07-18 MED ORDER — SODIUM CHLORIDE 0.9% FLUSH
10.0000 mL | INTRAVENOUS | Status: DC | PRN
  Administered 2017-07-18: 10 mL
  Filled 2017-07-18: qty 10

## 2017-07-18 MED ORDER — PROCHLORPERAZINE MALEATE 10 MG PO TABS: 10.0000 mg | ORAL_TABLET | Freq: Four times a day (QID) | ORAL | 1 refills | Status: DC | PRN

## 2017-07-18 MED ORDER — DOXORUBICIN HCL CHEMO IV INJECTION 2 MG/ML
60.0000 mg/m2 | Freq: Once | INTRAVENOUS | Status: AC
  Administered 2017-07-18: 110 mg via INTRAVENOUS
  Filled 2017-07-18: qty 55

## 2017-07-18 MED ORDER — SODIUM CHLORIDE 0.9 % IV SOLN
600.0000 mg/m2 | Freq: Once | INTRAVENOUS | Status: AC
  Administered 2017-07-18: 1100 mg via INTRAVENOUS
  Filled 2017-07-18: qty 25

## 2017-07-18 MED ORDER — ONDANSETRON HCL 8 MG PO TABS: 8.0000 mg | ORAL_TABLET | Freq: Two times a day (BID) | ORAL | 1 refills | Status: DC | PRN

## 2017-07-18 NOTE — Progress Notes (Signed)
Labs within parameters for treatment today.   .Treatment given per orders. Patient tolerated it well without problems. Vitals stable and discharged home from clinic ambulatory. Follow up as scheduled.

## 2017-07-18 NOTE — Patient Instructions (Signed)
[facility] Discharge Instructions for Patients Receiving Chemotherapy   Beginning January 23rd 2017 lab work for the Cancer Center will be done in the  Main lab at [facility] on 1st floor. If you have a lab appointment with the Cancer Center please come in thru the  Main Entrance and check in at the main information desk   Today you received the following chemotherapy agents   To help prevent nausea and vomiting after your treatment, we encourage you to take your nausea medication     If you develop nausea and vomiting, or diarrhea that is not controlled by your medication, call the clinic.  The clinic phone number is [phone]. Office hours are Monday-Friday 8:30am-5:00pm.  BELOW ARE SYMPTOMS THAT SHOULD BE REPORTED IMMEDIATELY:  *FEVER GREATER THAN 101.0 F  *CHILLS WITH OR WITHOUT FEVER  NAUSEA AND VOMITING THAT IS NOT CONTROLLED WITH YOUR NAUSEA MEDICATION  *UNUSUAL SHORTNESS OF BREATH  *UNUSUAL BRUISING OR BLEEDING  TENDERNESS IN MOUTH AND THROAT WITH OR WITHOUT PRESENCE OF ULCERS  *URINARY PROBLEMS  *BOWEL PROBLEMS  UNUSUAL RASH Items with * indicate a potential emergency and should be followed up as soon as possible. If you have an emergency after office hours please contact your primary care physician or go to the nearest emergency department.  Please call the clinic during office hours if you have any questions or concerns.   You may also contact the Patient Navigator at [phone] should you have any questions or need assistance in obtaining follow up care.      Resources For Cancer Patients and their Caregivers ? American Cancer Society: Can assist with transportation, wigs, general needs, runs Look Good Feel Better.        1-888-227-6333 ? Cancer Care: Provides financial assistance, online support groups, medication/co-pay assistance.  1-800-813-HOPE (4673) ? [facility] Assists [place] cancer  patients and their families through emotional , educational and financial support.  336-427-4357 ? [place] DSS Where to apply for food stamps, Medicaid and utility assistance. 336-342-1394 ? RCATS: Transportation to medical appointments. 336-347-2287 ? Social Security Administration: May apply for disability if have a Stage IV cancer. 336-342-7796 1-800-772-1213 ? [place] [facility], Disability and Transit Services: Assists with nutrition, care and transit needs. 336-349-2343         

## 2017-07-18 NOTE — Progress Notes (Signed)
Chemotherapy teaching completed.  Consent signed.  Extensive teaching packet given.   

## 2017-07-19 ENCOUNTER — Other Ambulatory Visit: Payer: Self-pay

## 2017-07-19 ENCOUNTER — Encounter (HOSPITAL_COMMUNITY): Payer: Self-pay

## 2017-07-19 ENCOUNTER — Inpatient Hospital Stay (HOSPITAL_COMMUNITY): Payer: Medicaid Other

## 2017-07-19 VITALS — BP 125/60 | HR 55 | Temp 98.6°F | Resp 16

## 2017-07-19 DIAGNOSIS — Z170 Estrogen receptor positive status [ER+]: Principal | ICD-10-CM

## 2017-07-19 DIAGNOSIS — Z5111 Encounter for antineoplastic chemotherapy: Secondary | ICD-10-CM | POA: Diagnosis not present

## 2017-07-19 DIAGNOSIS — C50412 Malignant neoplasm of upper-outer quadrant of left female breast: Secondary | ICD-10-CM

## 2017-07-19 MED ORDER — PROCHLORPERAZINE MALEATE 10 MG PO TABS
ORAL_TABLET | ORAL | Status: AC
  Filled 2017-07-19: qty 1

## 2017-07-19 MED ORDER — PEGFILGRASTIM-CBQV 6 MG/0.6ML ~~LOC~~ SOSY
6.0000 mg | PREFILLED_SYRINGE | Freq: Once | SUBCUTANEOUS | Status: AC
  Administered 2017-07-19: 6 mg via SUBCUTANEOUS

## 2017-07-19 MED ORDER — PROCHLORPERAZINE MALEATE 10 MG PO TABS
10.0000 mg | ORAL_TABLET | Freq: Once | ORAL | Status: AC
  Administered 2017-07-19: 10 mg via ORAL

## 2017-07-19 NOTE — Progress Notes (Signed)
Lisa Thornton presents today for injection per MD orders. Udenyca 6 mg dministered SQ in right Abdomen. Administration without incident. Patient tolerated well.   24 hour follow up -patient states she has had some bouts of nausea with some vomiting, has taken her nausea medication and it is helping. She is eating and drinking without any problems.    Vitals stable and discharged home from clinic ambulatory. Follow up as scheduled.

## 2017-07-19 NOTE — Patient Instructions (Signed)
[facility] Discharge Instructions  Lisa Thornton given today. Follow up as scheduled.   Thank you for choosing [facility] at [facility] to provide your oncology and hematology care.  To afford each patient quality time with our provider, please arrive at least 15 minutes before your scheduled appointment time.   If you have a lab appointment with the [facility] please come in thru the  Main Entrance and check in at the main information desk  You need to re-schedule your appointment should you arrive 10 or more minutes late.  We strive to give you quality time with our providers, and arriving late affects you and other patients whose appointments are after yours.  Also, if you no show three or more times for appointments you may be dismissed from the clinic at the providers discretion.     Again, thank you for choosing [facility].  Our hope is that these requests will decrease the amount of time that you wait before being seen by our physicians.       _____________________________________________________________  Should you have questions after your visit to [facility], please contact our office at [phone] between the hours of 8:30 a.m. and 4:30 p.m.  Voicemails left after 4:30 p.m. will not be returned until the following business day.  For prescription refill requests, have your pharmacy contact our office.       Resources For Cancer Patients and their Caregivers ? American Cancer Society: Can assist with transportation, wigs, general needs, runs Look Good Feel Better.        (902)586-7772 ? Cancer Care: Provides financial assistance, online support groups, medication/co-pay assistance.  1-800-813-HOPE 314-161-9855) ? [facility] Assists [place] cancer patients and their families through emotional , educational and financial support.  929-713-2498 ? [place]  DSS Where to apply for food stamps, Medicaid and utility assistance. 434-625-7719 ? RCATS: Transportation to medical appointments. 438-090-9890 ? Social Security Administration: May apply for disability if have a Stage IV cancer. 907-216-9670 862-230-4079 ? [employer], Disability and Transit Services: Assists with nutrition, care and transit needs. [facility] Support Programs:   > Cancer Support Group  2nd Tuesday of the month 1pm-2pm, Journey Room   > Creative Journey  3rd Tuesday of the month 1130am-1pm, Journey Room

## 2017-08-01 ENCOUNTER — Inpatient Hospital Stay (HOSPITAL_COMMUNITY): Payer: Medicaid Other

## 2017-08-01 ENCOUNTER — Encounter (HOSPITAL_COMMUNITY): Payer: Self-pay | Admitting: Hematology

## 2017-08-01 ENCOUNTER — Inpatient Hospital Stay (HOSPITAL_COMMUNITY): Payer: Medicaid Other | Attending: Hematology | Admitting: Hematology

## 2017-08-01 ENCOUNTER — Other Ambulatory Visit: Payer: Self-pay

## 2017-08-01 VITALS — BP 136/52 | HR 70 | Temp 98.2°F | Resp 18 | Wt 159.8 lb

## 2017-08-01 VITALS — BP 139/67 | HR 72 | Temp 97.4°F | Resp 18 | Wt 159.5 lb

## 2017-08-01 DIAGNOSIS — C50412 Malignant neoplasm of upper-outer quadrant of left female breast: Secondary | ICD-10-CM

## 2017-08-01 DIAGNOSIS — Z170 Estrogen receptor positive status [ER+]: Secondary | ICD-10-CM

## 2017-08-01 DIAGNOSIS — R197 Diarrhea, unspecified: Secondary | ICD-10-CM | POA: Insufficient documentation

## 2017-08-01 DIAGNOSIS — I10 Essential (primary) hypertension: Secondary | ICD-10-CM | POA: Insufficient documentation

## 2017-08-01 DIAGNOSIS — E876 Hypokalemia: Secondary | ICD-10-CM

## 2017-08-01 DIAGNOSIS — Z5189 Encounter for other specified aftercare: Secondary | ICD-10-CM | POA: Insufficient documentation

## 2017-08-01 DIAGNOSIS — B9620 Unspecified Escherichia coli [E. coli] as the cause of diseases classified elsewhere: Secondary | ICD-10-CM | POA: Insufficient documentation

## 2017-08-01 DIAGNOSIS — R7989 Other specified abnormal findings of blood chemistry: Secondary | ICD-10-CM

## 2017-08-01 DIAGNOSIS — Z5111 Encounter for antineoplastic chemotherapy: Secondary | ICD-10-CM | POA: Insufficient documentation

## 2017-08-01 DIAGNOSIS — R300 Dysuria: Secondary | ICD-10-CM | POA: Insufficient documentation

## 2017-08-01 LAB — CBC WITH DIFFERENTIAL/PLATELET
BASOS ABS: 0 10*3/uL (ref 0.0–0.1)
Basophils Relative: 0 %
Eosinophils Absolute: 0 10*3/uL (ref 0.0–0.7)
Eosinophils Relative: 0 %
HCT: 34.6 % — ABNORMAL LOW (ref 36.0–46.0)
HEMOGLOBIN: 11.9 g/dL — AB (ref 12.0–15.0)
LYMPHS PCT: 18 %
Lymphs Abs: 1.4 10*3/uL (ref 0.7–4.0)
MCH: 29.6 pg (ref 26.0–34.0)
MCHC: 34.4 g/dL (ref 30.0–36.0)
MCV: 86.1 fL (ref 78.0–100.0)
Monocytes Absolute: 0.9 10*3/uL (ref 0.1–1.0)
Monocytes Relative: 11 %
NEUTROS ABS: 5.7 10*3/uL (ref 1.7–7.7)
NEUTROS PCT: 71 %
PLATELETS: 248 10*3/uL (ref 150–400)
RBC: 4.02 MIL/uL (ref 3.87–5.11)
RDW: 11.9 % (ref 11.5–15.5)
WBC: 8 10*3/uL (ref 4.0–10.5)

## 2017-08-01 LAB — URINALYSIS, ROUTINE W REFLEX MICROSCOPIC
Bilirubin Urine: NEGATIVE
Glucose, UA: NEGATIVE mg/dL
HGB URINE DIPSTICK: NEGATIVE
Ketones, ur: NEGATIVE mg/dL
Leukocytes, UA: NEGATIVE
Nitrite: NEGATIVE
PROTEIN: NEGATIVE mg/dL
Specific Gravity, Urine: 1.013 (ref 1.005–1.030)
pH: 5 (ref 5.0–8.0)

## 2017-08-01 LAB — COMPREHENSIVE METABOLIC PANEL
ALBUMIN: 3.7 g/dL (ref 3.5–5.0)
ALT: 17 U/L (ref 14–54)
ANION GAP: 11 (ref 5–15)
AST: 22 U/L (ref 15–41)
Alkaline Phosphatase: 58 U/L (ref 38–126)
BILIRUBIN TOTAL: 0.4 mg/dL (ref 0.3–1.2)
BUN: 28 mg/dL — ABNORMAL HIGH (ref 6–20)
CO2: 24 mmol/L (ref 22–32)
Calcium: 8.7 mg/dL — ABNORMAL LOW (ref 8.9–10.3)
Chloride: 101 mmol/L (ref 101–111)
Creatinine, Ser: 1.37 mg/dL — ABNORMAL HIGH (ref 0.44–1.00)
GFR calc non Af Amer: 40 mL/min — ABNORMAL LOW (ref >60)
GFR, EST AFRICAN AMERICAN: 46 mL/min — AB (ref >60)
GLUCOSE: 147 mg/dL — AB (ref 65–99)
POTASSIUM: 3.1 mmol/L — AB (ref 3.5–5.1)
Sodium: 136 mmol/L (ref 135–145)
TOTAL PROTEIN: 7.5 g/dL (ref 6.5–8.1)

## 2017-08-01 MED ORDER — PALONOSETRON HCL INJECTION 0.25 MG/5ML
0.2500 mg | Freq: Once | INTRAVENOUS | Status: AC
  Administered 2017-08-01: 0.25 mg via INTRAVENOUS
  Filled 2017-08-01: qty 5

## 2017-08-01 MED ORDER — HEPARIN SOD (PORK) LOCK FLUSH 100 UNIT/ML IV SOLN
500.0000 [IU] | Freq: Once | INTRAVENOUS | Status: AC | PRN
  Administered 2017-08-01: 500 [IU]
  Filled 2017-08-01 (×2): qty 5

## 2017-08-01 MED ORDER — SODIUM CHLORIDE 0.9 % IV SOLN
600.0000 mg/m2 | Freq: Once | INTRAVENOUS | Status: AC
  Administered 2017-08-01: 1100 mg via INTRAVENOUS
  Filled 2017-08-01: qty 50

## 2017-08-01 MED ORDER — SODIUM CHLORIDE 0.9 % IV SOLN
Freq: Once | INTRAVENOUS | Status: AC
  Administered 2017-08-01: 12:00:00 via INTRAVENOUS
  Filled 2017-08-01: qty 1000

## 2017-08-01 MED ORDER — DOXORUBICIN HCL CHEMO IV INJECTION 2 MG/ML
60.0000 mg/m2 | Freq: Once | INTRAVENOUS | Status: AC
  Administered 2017-08-01: 110 mg via INTRAVENOUS
  Filled 2017-08-01: qty 55

## 2017-08-01 MED ORDER — POTASSIUM CHLORIDE 2 MEQ/ML IV SOLN
INTRAVENOUS | Status: DC
  Filled 2017-08-01 (×8): qty 1000

## 2017-08-01 MED ORDER — SODIUM CHLORIDE 0.9 % IV SOLN
INTRAVENOUS | Status: DC
  Filled 2017-08-01 (×11): qty 500

## 2017-08-01 MED ORDER — SODIUM CHLORIDE 0.9 % IV SOLN
Freq: Once | INTRAVENOUS | Status: AC
  Administered 2017-08-01: 11:00:00 via INTRAVENOUS

## 2017-08-01 MED ORDER — SODIUM CHLORIDE 0.9 % IV SOLN: INTRAVENOUS | Status: DC

## 2017-08-01 MED ORDER — POTASSIUM CHLORIDE 10 MEQ/100ML IV SOLN: 10.0000 meq | INTRAVENOUS | Status: DC

## 2017-08-01 MED ORDER — FOSAPREPITANT DIMEGLUMINE INJECTION 150 MG
Freq: Once | INTRAVENOUS | Status: AC
  Administered 2017-08-01: 11:00:00 via INTRAVENOUS
  Filled 2017-08-01: qty 5

## 2017-08-01 NOTE — Progress Notes (Signed)
Tolerated infusions w/o adverse reaction.  Alert, in no distress.  VSS.  Discharged ambulatory in c/o family.  

## 2017-08-01 NOTE — Assessment & Plan Note (Signed)
1.  Poorly differentiated left breast cancer, ER positive, PR and HER-2 negative: - On 06/12/2017, left breast biopsy with sarcomatoid features, left axillary lymph node biopsy negative.  2D echo shows EF of 55-60%. - CT of the chest showed subcarinal adenopathy, PET CT scan negative for metastatic disease. -Cycle 1 of dose dense azithromycin and cyclophosphamide started on 07/18/2017. -She tolerated her first cycle very well.  She had diarrhea for couple of days.  She had mild fatigue.  She complained of burning urination started yesterday.  We will check a UA.  I have reviewed her blood counts.  They look adequate to proceed with cycle 2 without any dose modifications.  She will be seen back in 2 weeks for follow-up.  2.  Newly diagnosed hypertension: She is taking metoprolol 50 mg twice daily.  Hydrochlorothiazide was added as her blood pressure was poorly controlled.  Her blood pressure has improved to 139/67.  She was advised to drink plenty of fluids.

## 2017-08-01 NOTE — Progress Notes (Signed)
Patient Care Team: Default, Provider, MD as PCP - General  DIAGNOSIS:  Encounter Diagnoses  Name Primary?  . Malignant neoplasm of upper-outer quadrant of left breast in female, estrogen receptor positive ([place]) Yes  . Encounter for antineoplastic chemotherapy     SUMMARY OF ONCOLOGIC HISTORY:   Breast cancer of upper-outer quadrant of left female breast ([place])   06/22/2017 Initial Diagnosis    Breast cancer of upper-outer quadrant of left female breast ([place])      06/22/2017 Cancer Staging    Staging form: Breast, AJCC 8th Edition - Clinical stage from 06/22/2017: Stage IIIB (cT3, cN1, cM0, G3, ER+, PR-, HER2-) - Signed by Derek Jack, MD on 06/22/2017      06/29/2017 Imaging    CT chest showing left axillary adenopathy, 1.2 cm anterior carinal adenopathy, left subpectoral lymph node subcentimeter  06/25/2017 2D echocardiogram with ejection fraction of 55-60%      07/16/2017 -  Chemotherapy    The patient had DOXOrubicin (ADRIAMYCIN) chemo injection 110 mg, 60 mg/m2 = 110 mg, Intravenous,  Once, 2 of 4 cycles Administration: 110 mg (07/18/2017) palonosetron (ALOXI) injection 0.25 mg, 0.25 mg, Intravenous,  Once, 2 of 4 cycles Administration: 0.25 mg (07/18/2017) pegfilgrastim-cbqv (UDENYCA) injection 6 mg, 6 mg, Subcutaneous, Once, 2 of 4 cycles Administration: 6 mg (07/19/2017) cyclophosphamide (CYTOXAN) 1,100 mg in sodium chloride 0.9 % 250 mL chemo infusion, 600 mg/m2 = 1,100 mg, Intravenous,  Once, 2 of 4 cycles Administration: 1,100 mg (07/18/2017) PACLitaxel (TAXOL) 144 mg in dextrose 5 % 250 mL chemo infusion (</= '80mg'$ /m2), 80 mg/m2, Intravenous,  Once, 0 of 12 cycles fosaprepitant (EMEND) 150 mg, dexamethasone (DECADRON) 12 mg in sodium chloride 0.9 % 145 mL IVPB, , Intravenous,  Once, 2 of 4 cycles Administration:  (07/18/2017)  for chemotherapy treatment.        CHIEF COMPLIANT: Stage IIIB invasive ductal carcinoma of (L) breast; ER+/PR-/HER2-   INTERVAL  HISTORY: Lisa Thornton is here for follow-up and subsequent cycle of neoadjuvant chemotherapy.   Due for cycle #2 Adriamycin/Cytoxan today.    She had diarrhea for a couple of days after cycle #1 AC. Endorses some fatigue; this is her biggest complaint today.  Appetite has been good.  Denies N&V, mucositis, shortness of breath, and orthopnea.  Her hair is starting to come out.  She states that she is drinking ~64 oz of water per day.    Endorses some dysuria; this started yesterday.  Denies any fever or chills. Denies frank hematuria.     REVIEW OF SYSTEMS:   Constitutional: Denies fevers, chills or abnormal weight loss.  She complains of mild fatigue. Eyes: Denies blurriness of vision Ears, nose, mouth, throat, and face: Denies mucositis or sore throat Respiratory: Denies cough, dyspnea or wheezes Cardiovascular: Denies palpitation, chest discomfort Gastrointestinal:  Denies nausea, heartburn or change in bowel habits Skin: Denies abnormal skin rashes Lymphatics: Denies new lymphadenopathy or easy bruising Neurological:Denies numbness, tingling or new weaknesses Behavioral/Psych: Mood is stable, no new changes  Extremities: No lower extremity edema Breast: denies any pain or lumps or nodules in either breasts All other systems were reviewed with the patient and are negative.  I have reviewed the past medical history, past surgical history, social history and family history with the patient and they are unchanged from previous note.  ALLERGIES:  has No Known Allergies.  MEDICATIONS:  Current Outpatient Medications  Medication Sig Dispense Refill  . cyclophosphamide (CYTOXAN) 2 g chemo injection Inject into the vein once.  Every 2 weeks x 4 cycles    . DOXOrubicin HCl (ADRIAMYCIN IV) Inject into the vein. Every 2 weeks x 4 cycles    . HYDROcodone-acetaminophen (NORCO) 5-325 MG tablet Take 1 tablet by mouth every 6 (six) hours as needed for moderate pain. 25 tablet 0  . metoprolol  tartrate (LOPRESSOR) 50 MG tablet Take 50 mg by mouth 2 (two) times daily.    . ondansetron (ZOFRAN) 8 MG tablet Take 1 tablet (8 mg total) by mouth 2 (two) times daily as needed. Start on the third day after chemotherapy. 30 tablet 1  . pegfilgrastim-cbqv (UDENYCA) 6 MG/0.6ML injection Inject 6 mg into the skin once. Every 2 weeks x 4 cycles    . prochlorperazine (COMPAZINE) 10 MG tablet Take 1 tablet (10 mg total) by mouth every 6 (six) hours as needed (Nausea or vomiting). 30 tablet 1  . triamterene-hydrochlorothiazide (MAXZIDE-25) 37.5-25 MG tablet   5   No current facility-administered medications for this visit.    Facility-Administered Medications Ordered in Other Visits  Medication Dose Route Frequency Provider Last Rate Last Dose  . 0.9 %  sodium chloride infusion   Intravenous Continuous Holley Bouche, NP      . cyclophosphamide (CYTOXAN) 1,100 mg in sodium chloride 0.9 % 250 mL chemo infusion  600 mg/m2 (Treatment Plan Recorded) Intravenous Once Derek Jack, MD      . DOXOrubicin (ADRIAMYCIN) chemo injection 110 mg  60 mg/m2 (Treatment Plan Recorded) Intravenous Once Derek Jack, MD      . heparin lock flush 100 unit/mL  500 Units Intracatheter Once PRN Derek Jack, MD        PHYSICAL EXAMINATION: ECOG PERFORMANCE STATUS: 1 - Symptomatic but completely ambulatory  I have reviewed her vital signs. GENERAL:alert, no distress and comfortable Oral cavity no mucositis or thrush.   LABORATORY DATA:  I have reviewed the data as listed CMP Latest Ref Rng & Units 08/01/2017 07/18/2017 06/22/2017  Glucose 65 - 99 mg/dL 147(H) 152(H) 134(H)  BUN 6 - 20 mg/dL 28(H) 25(H) 15  Creatinine 0.44 - 1.00 mg/dL 1.37(H) 1.14(H) 0.91  Sodium 135 - 145 mmol/L 136 134(L) 137  Potassium 3.5 - 5.1 mmol/L 3.1(L) 4.1 4.3  Chloride 101 - 111 mmol/L 101 99(L) 99(L)  CO2 22 - 32 mmol/L '24 24 26  '$ Calcium 8.9 - 10.3 mg/dL 8.7(L) 9.3 9.8  Total Protein 6.5 - 8.1 g/dL 7.5 8.1  8.7(H)  Total Bilirubin 0.3 - 1.2 mg/dL 0.4 0.5 0.5  Alkaline Phos 38 - 126 U/L 58 59 60  AST 15 - 41 U/L '22 22 24  '$ ALT 14 - 54 U/L '17 17 20   '$ No results found for: FYB017   Lab Results  Component Value Date   WBC 8.0 08/01/2017   HGB 11.9 (L) 08/01/2017   HCT 34.6 (L) 08/01/2017   MCV 86.1 08/01/2017   PLT 248 08/01/2017   NEUTROABS 5.7 08/01/2017    ASSESSMENT & PLAN:  Breast cancer of upper-outer quadrant of left female breast ([place]) 1.  Poorly differentiated left breast cancer, ER positive, PR and HER-2 negative: - On 06/12/2017, left breast biopsy with sarcomatoid features, left axillary lymph node biopsy negative.  2D echo shows EF of 55-60%. - CT of the chest showed subcarinal adenopathy, PET CT scan negative for metastatic disease. -Cycle 1 of dose dense azithromycin and cyclophosphamide started on 07/18/2017. -She tolerated her first cycle very well.  She had diarrhea for couple of days.  She had mild fatigue.  She complained of burning urination started yesterday.  We will check a UA.  I have reviewed her blood counts.  They look adequate to proceed with cycle 2 without any dose modifications.  She will be seen back in 2 weeks for follow-up.  2.  Newly diagnosed hypertension: She is taking metoprolol 50 mg twice daily.  Hydrochlorothiazide was added as her blood pressure was poorly controlled.  Her blood pressure has improved to 139/67.  She was advised to drink plenty of fluids.    No orders of the defined types were placed in this encounter.  The patient has a good understanding of the overall plan. she agrees with it. she will call with any problems that may develop before the next visit here.   This note includes documentation from Mike Craze, NP, who was present during this patient's office visit and evaluation.  I have reviewed this note for its completeness and accuracy.  I have edited this note accordingly based on my findings and medical opinion.       Derek Jack, MD 08/01/17

## 2017-08-02 ENCOUNTER — Inpatient Hospital Stay (HOSPITAL_COMMUNITY): Payer: Medicaid Other

## 2017-08-02 VITALS — BP 139/51 | HR 57 | Temp 98.5°F | Resp 18

## 2017-08-02 DIAGNOSIS — Z170 Estrogen receptor positive status [ER+]: Secondary | ICD-10-CM

## 2017-08-02 DIAGNOSIS — Z5111 Encounter for antineoplastic chemotherapy: Secondary | ICD-10-CM | POA: Diagnosis not present

## 2017-08-02 DIAGNOSIS — C50412 Malignant neoplasm of upper-outer quadrant of left female breast: Secondary | ICD-10-CM

## 2017-08-02 DIAGNOSIS — E876 Hypokalemia: Secondary | ICD-10-CM

## 2017-08-02 MED ORDER — PEGFILGRASTIM-CBQV 6 MG/0.6ML ~~LOC~~ SOSY
6.0000 mg | PREFILLED_SYRINGE | Freq: Once | SUBCUTANEOUS | Status: AC
  Administered 2017-08-02: 6 mg via SUBCUTANEOUS
  Filled 2017-08-02: qty 0.6

## 2017-08-02 NOTE — Progress Notes (Signed)
Lisa Thornton presents today for injection per the provider's orders.  Udenyca administration without incident; see MAR for injection details.  Patient tolerated procedure well and without incident.  No questions or complaints noted at this time.  Discharged ambulatory.

## 2017-08-04 LAB — URINE CULTURE

## 2017-08-07 ENCOUNTER — Other Ambulatory Visit (HOSPITAL_COMMUNITY): Payer: Self-pay | Admitting: Adult Health

## 2017-08-07 DIAGNOSIS — Z170 Estrogen receptor positive status [ER+]: Secondary | ICD-10-CM

## 2017-08-07 DIAGNOSIS — C50412 Malignant neoplasm of upper-outer quadrant of left female breast: Secondary | ICD-10-CM

## 2017-08-07 DIAGNOSIS — N390 Urinary tract infection, site not specified: Secondary | ICD-10-CM

## 2017-08-07 MED ORDER — SULFAMETHOXAZOLE-TRIMETHOPRIM 800-160 MG PO TABS: 1.0000 | ORAL_TABLET | Freq: Two times a day (BID) | ORAL | 0 refills | Status: DC

## 2017-08-14 NOTE — Progress Notes (Signed)
[facility]   Patient Care Team: Default, Provider, MD as PCP - General  DIAGNOSIS:  Encounter Diagnoses  Name Primary?  . Malignant neoplasm of upper-outer quadrant of left breast in female, estrogen receptor positive ([place]) Yes  . Elevated serum creatinine   . Essential hypertension     SUMMARY OF ONCOLOGIC HISTORY:   Breast cancer of upper-outer quadrant of left female breast ([place])   06/22/2017 Initial Diagnosis    Breast cancer of upper-outer quadrant of left female breast ([place])      06/22/2017 Cancer Staging    Staging form: Breast, AJCC 8th Edition - Clinical stage from 06/22/2017: Stage IIIB (cT3, cN1, cM0, G3, ER+, PR-, HER2-) - Signed by Derek Jack, MD on 06/22/2017      06/29/2017 Imaging    CT chest showing left axillary adenopathy, 1.2 cm anterior carinal adenopathy, left subpectoral lymph node subcentimeter  06/25/2017 2D echocardiogram with ejection fraction of 55-60%      07/16/2017 -  Chemotherapy    The patient had DOXOrubicin (ADRIAMYCIN) chemo injection 110 mg, 60 mg/m2 = 110 mg, Intravenous,  Once, 2 of 4 cycles Administration: 110 mg (07/18/2017), 110 mg (08/01/2017) palonosetron (ALOXI) injection 0.25 mg, 0.25 mg, Intravenous,  Once, 2 of 4 cycles Administration: 0.25 mg (07/18/2017), 0.25 mg (08/01/2017) pegfilgrastim-cbqv (UDENYCA) injection 6 mg, 6 mg, Subcutaneous, Once, 2 of 4 cycles Administration: 6 mg (07/19/2017) cyclophosphamide (CYTOXAN) 1,100 mg in sodium chloride 0.9 % 250 mL chemo infusion, 600 mg/m2 = 1,100 mg, Intravenous,  Once, 2 of 4 cycles Administration: 1,100 mg (07/18/2017), 1,100 mg (08/01/2017) PACLitaxel (TAXOL) 144 mg in dextrose 5 % 250 mL chemo infusion (</= 68m/m2), 80 mg/m2, Intravenous,  Once, 0 of 12 cycles fosaprepitant (EMEND) 150 mg, dexamethasone (DECADRON) 12 mg in sodium chloride 0.9 % 145 mL IVPB, , Intravenous,  Once, 2 of 4 cycles Administration:  (07/18/2017),  (08/01/2017)  for chemotherapy treatment.          CHIEF COMPLIANT: Stage IIIB invasive ductal carcinoma of (L) breast; ER+/PR-/HER2-   INTERVAL HISTORY: Lisa DEREMERis here for follow-up and subsequent cycle of neoadjuvant chemotherapy.   Due for cycle #3 Adriamycin/Cytoxan today.    Reports diarrhea for a couple of days after chemo.  She takes Imodium She feels tired at times; she rests and recovers.  Denies orthopnea, ankle swelling. Denies N&V. Appetite is good. Denies any new pain.  Denies bone pain after Udenyca injections; does endorse lower leg cramping which is worse at night.  Denies mucositis.   At her last visit, she reported dysuria. UA was negative, but urine culture was positive with 80,000 colonies of ENTEROBACTER CLOACAE; bacteria sensitive to Bactrim DS. She was treated with Bactrim DS x 5 days; she completed the course of antibiotics.  Denies any dysuria today.   Her creatinine is elevated today to 2.07; recommend she stop the Maxide.  This was d/c'd from her med list today.  Recommend starting Norvasc instead.  She states that she drinks plenty of fluids.    Will plan to hold chemo today given elevated creatinine; we will plan to hydrate her with IVF (1L NS over 2 hours) today. Will recheck BMP tomorrow and attempt to give treatment at that time.      REVIEW OF SYSTEMS:   Constitutional: Denies fevers, chills or abnormal weight loss.  She complains of mild fatigue. Eyes: Denies blurriness of vision Ears, nose, mouth, throat, and face: Denies mucositis or sore throat Respiratory: Denies cough,  dyspnea or wheezes Cardiovascular: Denies palpitation, chest discomfort Gastrointestinal:  Denies nausea, heartburn or change in bowel habits Skin: Denies abnormal skin rashes Lymphatics: Denies new lymphadenopathy or easy bruising Neurological:Denies numbness, tingling or new weaknesses Behavioral/Psych: Mood is stable, no new changes  Extremities: No lower extremity edema Breast: denies any pain or lumps or nodules  in either breasts All other systems were reviewed with the patient and are negative.  I have reviewed the past medical history, past surgical history, social history and family history with the patient and they are unchanged from previous note.  ALLERGIES:  has No Known Allergies.  MEDICATIONS:  Current Outpatient Medications  Medication Sig Dispense Refill  . amLODipine (NORVASC) 5 MG tablet Take 1 tablet (5 mg total) by mouth daily. 30 tablet 6  . cyclophosphamide (CYTOXAN) 2 g chemo injection Inject into the vein once. Every 2 weeks x 4 cycles    . DOXOrubicin HCl (ADRIAMYCIN IV) Inject into the vein. Every 2 weeks x 4 cycles    . HYDROcodone-acetaminophen (NORCO) 5-325 MG tablet Take 1 tablet by mouth every 6 (six) hours as needed for moderate pain. 25 tablet 0  . metoprolol tartrate (LOPRESSOR) 50 MG tablet Take 50 mg by mouth 2 (two) times daily.    . ondansetron (ZOFRAN) 8 MG tablet Take 1 tablet (8 mg total) by mouth 2 (two) times daily as needed. Start on the third day after chemotherapy. 30 tablet 1  . pegfilgrastim-cbqv (UDENYCA) 6 MG/0.6ML injection Inject 6 mg into the skin once. Every 2 weeks x 4 cycles    . triamterene-hydrochlorothiazide (MAXZIDE-25) 37.5-25 MG tablet   5  . prochlorperazine (COMPAZINE) 10 MG tablet Take 1 tablet (10 mg total) by mouth every 6 (six) hours as needed (Nausea or vomiting). (Patient not taking: Reported on 08/15/2017) 30 tablet 1   No current facility-administered medications for this visit.    Facility-Administered Medications Ordered in Other Visits  Medication Dose Route Frequency Provider Last Rate Last Dose  . 0.9 %  sodium chloride infusion   Intravenous Continuous Holley Bouche, NP 500 mL/hr at 08/15/17 0960    . heparin lock flush 100 unit/mL  500 Units Intravenous Once Derek Jack, MD        PHYSICAL EXAMINATION: ECOG PERFORMANCE STATUS: 1 - Symptomatic but completely ambulatory  I have reviewed her vital  signs. GENERAL:alert, no distress and comfortable Oral cavity no mucositis or thrush.   LABORATORY DATA:  I have reviewed the data as listed CMP Latest Ref Rng & Units 08/15/2017 08/01/2017 07/18/2017  Glucose 65 - 99 mg/dL 132(H) 147(H) 152(H)  BUN 6 - 20 mg/dL 26(H) 28(H) 25(H)  Creatinine 0.44 - 1.00 mg/dL 2.07(H) 1.37(H) 1.14(H)  Sodium 135 - 145 mmol/L 136 136 134(L)  Potassium 3.5 - 5.1 mmol/L 3.6 3.1(L) 4.1  Chloride 101 - 111 mmol/L 100(L) 101 99(L)  CO2 22 - 32 mmol/L '27 24 24  ' Calcium 8.9 - 10.3 mg/dL 9.0 8.7(L) 9.3  Total Protein 6.5 - 8.1 g/dL 7.7 7.5 8.1  Total Bilirubin 0.3 - 1.2 mg/dL 0.3 0.4 0.5  Alkaline Phos 38 - 126 U/L 61 58 59  AST 15 - 41 U/L '21 22 22  ' ALT 14 - 54 U/L '16 17 17   ' No results found for: AVW098   Lab Results  Component Value Date   WBC 7.9 08/15/2017   HGB 11.2 (L) 08/15/2017   HCT 33.2 (L) 08/15/2017   MCV 87.8 08/15/2017   PLT 253 08/15/2017  NEUTROABS 6.1 08/15/2017    ASSESSMENT & PLAN:  Breast cancer of upper-outer quadrant of left female breast ([place]) 1.  Poorly differentiated left breast cancer, ER positive, PR and HER-2 negative: - On 06/12/2017, left breast biopsy with sarcomatoid features, left axillary lymph node biopsy negative.  2D echo shows EF of 55-60%. - CT of the chest showed subcarinal adenopathy, PET CT scan negative for metastatic disease. -Cycle 1 of dose dense Adriamycin and cyclophosphamide started on 07/18/2017. -She has completed 2 cycles and here for cycle 3 today.  However her creatinine has gone up about 2.  We would hold off on cycle 3 today.  I will give her 1 L of normal saline.  We have told her to discontinue Maxide.  We will check her BMP tomorrow and give chemotherapy.  She will be seen back in 2 weeks for cycle 4.  She was instructed to drink a lot of fluids.  2.  Hypertension: She is taking metoprolol 50 mg twice daily.  We have discontinued Maxide because of elevated creatinine.  I have called in a  prescription for Norvasc 5 mg to be taken daily.  We will titrate up as needed.  3.  Elevated creatinine: This is from a combination of Bactrim and Maxide.  She has finished Bactrim for UTI.  We are holding Maxide and given fluids today.    Orders Placed This Encounter  Procedures  . Basic metabolic panel    Standing Status:   Future    Standing Expiration Date:   08/16/2018   The patient has a good understanding of the overall plan. she agrees with it. she will call with any problems that may develop before the next visit here.   This note includes documentation from Mike Craze, NP, who was present during this patient's office visit and evaluation.  I have reviewed this note for its completeness and accuracy.  I have edited this note accordingly based on my findings and medical opinion.      Derek Jack, MD 08/15/17

## 2017-08-15 ENCOUNTER — Encounter (HOSPITAL_COMMUNITY): Payer: Self-pay | Admitting: Hematology

## 2017-08-15 ENCOUNTER — Other Ambulatory Visit: Payer: Self-pay

## 2017-08-15 ENCOUNTER — Inpatient Hospital Stay (HOSPITAL_COMMUNITY): Payer: Medicaid Other

## 2017-08-15 ENCOUNTER — Inpatient Hospital Stay (HOSPITAL_BASED_OUTPATIENT_CLINIC_OR_DEPARTMENT_OTHER): Payer: Medicaid Other | Admitting: Hematology

## 2017-08-15 VITALS — BP 140/60 | HR 68 | Temp 97.8°F | Resp 18

## 2017-08-15 VITALS — BP 145/64 | HR 72 | Temp 97.8°F | Resp 18 | Wt 158.2 lb

## 2017-08-15 DIAGNOSIS — Z170 Estrogen receptor positive status [ER+]: Secondary | ICD-10-CM | POA: Diagnosis not present

## 2017-08-15 DIAGNOSIS — I10 Essential (primary) hypertension: Secondary | ICD-10-CM | POA: Diagnosis not present

## 2017-08-15 DIAGNOSIS — C50412 Malignant neoplasm of upper-outer quadrant of left female breast: Secondary | ICD-10-CM | POA: Diagnosis not present

## 2017-08-15 DIAGNOSIS — R197 Diarrhea, unspecified: Secondary | ICD-10-CM

## 2017-08-15 DIAGNOSIS — B9620 Unspecified Escherichia coli [E. coli] as the cause of diseases classified elsewhere: Secondary | ICD-10-CM | POA: Diagnosis not present

## 2017-08-15 DIAGNOSIS — R7989 Other specified abnormal findings of blood chemistry: Secondary | ICD-10-CM

## 2017-08-15 DIAGNOSIS — Z5111 Encounter for antineoplastic chemotherapy: Secondary | ICD-10-CM | POA: Diagnosis not present

## 2017-08-15 LAB — CBC WITH DIFFERENTIAL/PLATELET
Basophils Absolute: 0 10*3/uL (ref 0.0–0.1)
Basophils Relative: 0 %
EOS ABS: 0 10*3/uL (ref 0.0–0.7)
Eosinophils Relative: 0 %
HCT: 33.2 % — ABNORMAL LOW (ref 36.0–46.0)
Hemoglobin: 11.2 g/dL — ABNORMAL LOW (ref 12.0–15.0)
Lymphocytes Relative: 11 %
Lymphs Abs: 0.9 10*3/uL (ref 0.7–4.0)
MCH: 29.6 pg (ref 26.0–34.0)
MCHC: 33.7 g/dL (ref 30.0–36.0)
MCV: 87.8 fL (ref 78.0–100.0)
MONO ABS: 0.9 10*3/uL (ref 0.1–1.0)
Monocytes Relative: 11 %
NEUTROS PCT: 78 %
Neutro Abs: 6.1 10*3/uL (ref 1.7–7.7)
PLATELETS: 253 10*3/uL (ref 150–400)
RBC: 3.78 MIL/uL — AB (ref 3.87–5.11)
RDW: 11.9 % (ref 11.5–15.5)
WBC: 7.9 10*3/uL (ref 4.0–10.5)

## 2017-08-15 LAB — COMPREHENSIVE METABOLIC PANEL
ALBUMIN: 4.1 g/dL (ref 3.5–5.0)
ALT: 16 U/L (ref 14–54)
ANION GAP: 9 (ref 5–15)
AST: 21 U/L (ref 15–41)
Alkaline Phosphatase: 61 U/L (ref 38–126)
BUN: 26 mg/dL — AB (ref 6–20)
CHLORIDE: 100 mmol/L — AB (ref 101–111)
CO2: 27 mmol/L (ref 22–32)
Calcium: 9 mg/dL (ref 8.9–10.3)
Creatinine, Ser: 2.07 mg/dL — ABNORMAL HIGH (ref 0.44–1.00)
GFR calc Af Amer: 28 mL/min — ABNORMAL LOW (ref >60)
GFR calc non Af Amer: 24 mL/min — ABNORMAL LOW (ref >60)
GLUCOSE: 132 mg/dL — AB (ref 65–99)
POTASSIUM: 3.6 mmol/L (ref 3.5–5.1)
SODIUM: 136 mmol/L (ref 135–145)
Total Bilirubin: 0.3 mg/dL (ref 0.3–1.2)
Total Protein: 7.7 g/dL (ref 6.5–8.1)

## 2017-08-15 MED ORDER — SODIUM CHLORIDE 0.9% FLUSH
10.0000 mL | Freq: Once | INTRAVENOUS | Status: AC
  Administered 2017-08-15: 10 mL via INTRAVENOUS

## 2017-08-15 MED ORDER — AMLODIPINE BESYLATE 5 MG PO TABS: 5.0000 mg | ORAL_TABLET | Freq: Every day | ORAL | 6 refills | Status: DC

## 2017-08-15 MED ORDER — SODIUM CHLORIDE 0.9 % IV SOLN
INTRAVENOUS | Status: DC
  Administered 2017-08-15: 09:00:00 via INTRAVENOUS

## 2017-08-15 MED ORDER — HEPARIN SOD (PORK) LOCK FLUSH 100 UNIT/ML IV SOLN
500.0000 [IU] | Freq: Once | INTRAVENOUS | Status: AC
  Administered 2017-08-15: 500 [IU] via INTRAVENOUS

## 2017-08-15 NOTE — Progress Notes (Signed)
Patient to treatment room for hydration today and possible treatment tomorrow depending on lab work.  Patient encouraged to drink water and not teas/drinks with caffeine but to switch to decaffeinated sodas/tea.  Patient and family verbalized understanding.  No other complaints voiced.    Patient tolerated hydration with no complaints voiced.  Port site clean and dry with no bruising or swelling noted at site. Good blood return noted.  Band aid applied.  VSS with discharge and left ambulatory with family.  No s/s of distress noted.

## 2017-08-15 NOTE — Patient Instructions (Signed)
[place] at [facility]  Discharge Instructions:  Return tomorrow for labs and possible treatment.  _______________________________________________________________  Thank you for choosing [place] at [facility] to provide your oncology and hematology care.  To afford each patient quality time with our providers, please arrive at least 15 minutes before your scheduled appointment.  You need to re-schedule your appointment if you arrive 10 or more minutes late.  We strive to give you quality time with our providers, and arriving late affects you and other patients whose appointments are after yours.  Also, if you no show three or more times for appointments you may be dismissed from the clinic.  Again, thank you for choosing [place] at [place] hope is that these requests will allow you access to exceptional care and in a timely manner. _______________________________________________________________  If you have questions after your visit, please contact our office at [phone] between the hours of 8:30 a.m. and 5:00 p.m. Voicemails left after 4:30 p.m. will not be returned until the following business day. _______________________________________________________________  For prescription refill requests, have your pharmacy contact our office. _______________________________________________________________  Recommendations made by the consultant and any test results will be sent to your referring physician. _______________________________________________________________

## 2017-08-15 NOTE — Assessment & Plan Note (Signed)
1.  Poorly differentiated left breast cancer, ER positive, PR and HER-2 negative: - On 06/12/2017, left breast biopsy with sarcomatoid features, left axillary lymph node biopsy negative.  2D echo shows EF of 55-60%. - CT of the chest showed subcarinal adenopathy, PET CT scan negative for metastatic disease. -Cycle 1 of dose dense Adriamycin and cyclophosphamide started on 07/18/2017. -She has completed 2 cycles and here for cycle 3 today.  However her creatinine has gone up about 2.  We would hold off on cycle 3 today.  I will give her 1 L of normal saline.  We have told her to discontinue Maxide.  We will check her BMP tomorrow and give chemotherapy.  She will be seen back in 2 weeks for cycle 4.  She was instructed to drink a lot of fluids.  2.  Hypertension: She is taking metoprolol 50 mg twice daily.  We have discontinued Maxide because of elevated creatinine.  I have called in a prescription for Norvasc 5 mg to be taken daily.  We will titrate up as needed.  3.  Elevated creatinine: This is from a combination of Bactrim and Maxide.  She has finished Bactrim for UTI.  We are holding Maxide and given fluids today.

## 2017-08-16 ENCOUNTER — Inpatient Hospital Stay (HOSPITAL_COMMUNITY): Payer: Medicaid Other

## 2017-08-16 ENCOUNTER — Encounter (HOSPITAL_COMMUNITY): Payer: Self-pay

## 2017-08-16 VITALS — BP 143/46 | HR 63 | Temp 98.0°F | Resp 16 | Wt 159.2 lb

## 2017-08-16 DIAGNOSIS — C50412 Malignant neoplasm of upper-outer quadrant of left female breast: Secondary | ICD-10-CM

## 2017-08-16 DIAGNOSIS — Z170 Estrogen receptor positive status [ER+]: Principal | ICD-10-CM

## 2017-08-16 DIAGNOSIS — Z5111 Encounter for antineoplastic chemotherapy: Secondary | ICD-10-CM

## 2017-08-16 DIAGNOSIS — R7989 Other specified abnormal findings of blood chemistry: Secondary | ICD-10-CM

## 2017-08-16 LAB — BASIC METABOLIC PANEL
Anion gap: 9 (ref 5–15)
BUN: 21 mg/dL — ABNORMAL HIGH (ref 6–20)
CHLORIDE: 102 mmol/L (ref 101–111)
CO2: 28 mmol/L (ref 22–32)
CREATININE: 1.73 mg/dL — AB (ref 0.44–1.00)
Calcium: 8.9 mg/dL (ref 8.9–10.3)
GFR calc Af Amer: 35 mL/min — ABNORMAL LOW (ref >60)
GFR calc non Af Amer: 30 mL/min — ABNORMAL LOW (ref >60)
Glucose, Bld: 128 mg/dL — ABNORMAL HIGH (ref 65–99)
Potassium: 3.5 mmol/L (ref 3.5–5.1)
Sodium: 139 mmol/L (ref 135–145)

## 2017-08-16 MED ORDER — HEPARIN SOD (PORK) LOCK FLUSH 100 UNIT/ML IV SOLN
500.0000 [IU] | Freq: Once | INTRAVENOUS | Status: AC
  Administered 2017-08-16: 500 [IU] via INTRAVENOUS

## 2017-08-16 MED ORDER — SODIUM CHLORIDE 0.9 % IV SOLN
Freq: Once | INTRAVENOUS | Status: AC
  Administered 2017-08-16: 1000 mL via INTRAVENOUS

## 2017-08-16 MED ORDER — SODIUM CHLORIDE 0.9% FLUSH
10.0000 mL | Freq: Once | INTRAVENOUS | Status: AC
  Administered 2017-08-16: 10 mL via INTRAVENOUS

## 2017-08-16 NOTE — Progress Notes (Signed)
Lab work reviewed with Dr. Delton Coombes.  Patient to receive hydration today.  Repeat lab work on Friday with possible hydration.  On Monday recheck labs for chemotherapy if serum creatinine is good.    Patient tolerated hydration with no complaints voiced.  Port site clean and dry with no bruising or swelling noted at site.  Good blood return noted with no complaints of pain with flush.  Band aid applied.  VSS with discharge and left ambulatory with family with no s/s of distress noted.

## 2017-08-16 NOTE — Patient Instructions (Signed)
[facility] at [facility]  Discharge Instructions:  You received hydration today.  _______________________________________________________________  Thank you for choosing [facility] at [facility] to provide your oncology and hematology care.  To afford each patient quality time with our providers, please arrive at least 15 minutes before your scheduled appointment.  You need to re-schedule your appointment if you arrive 10 or more minutes late.  We strive to give you quality time with our providers, and arriving late affects you and other patients whose appointments are after yours.  Also, if you no show three or more times for appointments you may be dismissed from the clinic.  Again, thank you for choosing [facility] at [facility]. Our hope is that these requests will allow you access to exceptional care and in a timely manner. _______________________________________________________________  If you have questions after your visit, please contact our office at [phone] between the hours of 8:30 a.m. and 5:00 p.m. Voicemails left after 4:30 p.m. will not be returned until the following business day. _______________________________________________________________  For prescription refill requests, have your pharmacy contact our office. _______________________________________________________________  Recommendations made by the consultant and any test results will be sent to your referring physician. _______________________________________________________________ 

## 2017-08-17 ENCOUNTER — Ambulatory Visit (HOSPITAL_COMMUNITY): Payer: Self-pay

## 2017-08-17 ENCOUNTER — Inpatient Hospital Stay (HOSPITAL_COMMUNITY): Payer: Medicaid Other

## 2017-08-17 DIAGNOSIS — Z5111 Encounter for antineoplastic chemotherapy: Secondary | ICD-10-CM | POA: Diagnosis not present

## 2017-08-17 DIAGNOSIS — R7989 Other specified abnormal findings of blood chemistry: Secondary | ICD-10-CM

## 2017-08-17 DIAGNOSIS — C50412 Malignant neoplasm of upper-outer quadrant of left female breast: Secondary | ICD-10-CM

## 2017-08-17 DIAGNOSIS — Z170 Estrogen receptor positive status [ER+]: Principal | ICD-10-CM

## 2017-08-17 LAB — BASIC METABOLIC PANEL
ANION GAP: 8 (ref 5–15)
BUN: 16 mg/dL (ref 6–20)
CALCIUM: 8.9 mg/dL (ref 8.9–10.3)
CO2: 26 mmol/L (ref 22–32)
CREATININE: 1.43 mg/dL — AB (ref 0.44–1.00)
Chloride: 105 mmol/L (ref 101–111)
GFR calc Af Amer: 44 mL/min — ABNORMAL LOW (ref >60)
GFR calc non Af Amer: 38 mL/min — ABNORMAL LOW (ref >60)
GLUCOSE: 128 mg/dL — AB (ref 65–99)
Potassium: 3.9 mmol/L (ref 3.5–5.1)
Sodium: 139 mmol/L (ref 135–145)

## 2017-08-17 MED ORDER — SODIUM CHLORIDE 0.9 % IV SOLN
INTRAVENOUS | Status: DC
  Administered 2017-08-17: 13:00:00 via INTRAVENOUS

## 2017-08-17 MED ORDER — SODIUM CHLORIDE 0.9% FLUSH
10.0000 mL | Freq: Once | INTRAVENOUS | Status: AC
  Administered 2017-08-17: 10 mL

## 2017-08-17 MED ORDER — HEPARIN SOD (PORK) LOCK FLUSH 100 UNIT/ML IV SOLN
500.0000 [IU] | Freq: Once | INTRAVENOUS | Status: AC
  Administered 2017-08-17: 500 [IU] via INTRAVENOUS
  Filled 2017-08-17: qty 5

## 2017-08-17 NOTE — Patient Instructions (Signed)
[facility] at [facility] Discharge Instructions  One liter of fluids given today  Follow up as scheduled.  Thank you for choosing [facility] at [facility] to provide your oncology and hematology care.  To afford each patient quality time with our provider, please arrive at least 15 minutes before your scheduled appointment time.   If you have a lab appointment with the Cancer Center please come in thru the  Main Entrance and check in at the main information desk  You need to re-schedule your appointment should you arrive 10 or more minutes late.  We strive to give you quality time with our providers, and arriving late affects you and other patients whose appointments are after yours.  Also, if you no show three or more times for appointments you may be dismissed from the clinic at the providers discretion.     Again, thank you for choosing  Cancer Center.  Our hope is that these requests will decrease the amount of time that you wait before being seen by our physicians.       _____________________________________________________________  Should you have questions after your visit to  Cancer Center, please contact our office at [phone] between the hours of 8:30 a.m. and 4:30 p.m.  Voicemails left after 4:30 p.m. will not be returned until the following business day.  For prescription refill requests, have your pharmacy contact our office.       Resources For Cancer Patients and their Caregivers ? American Cancer Society: Can assist with transportation, wigs, general needs, runs Look Good Feel Better.        1-888-227-6333 ? Cancer Care: Provides financial assistance, online support groups, medication/co-pay assistance.  1-800-813-HOPE (4673) ? [facility] Assists [place] cancer patients and their families through emotional , educational and financial support.  336-427-4357 ? [place] DSS Where to apply for food stamps, Medicaid and utility assistance. 336-342-1394 ? [employer]: Transportation to medical appointments. 336-347-2287 ? Social Security Administration: May apply for disability if have a Stage IV cancer. 336-342-7796 1-800-772-1213 ? [place] Aging, Disability and Transit Services: Assists with nutrition, care and transit needs. 336-349-2343  Cancer Center Support Programs:   > Cancer Support Group  2nd Tuesday of the month 1pm-2pm, Journey Room   > Creative Journey  3rd Tuesday of the month 1130am-1pm, Journey Room    

## 2017-08-17 NOTE — Progress Notes (Signed)
Labs reviewed with Dr. Delton Coombes . Will give one liter of fluids today per orders.  Treatment given per orders. Patient tolerated it well without problems. Vitals stable and discharged home from clinic ambulatory. Follow up as scheduled.

## 2017-08-20 ENCOUNTER — Inpatient Hospital Stay (HOSPITAL_COMMUNITY): Payer: Medicaid Other

## 2017-08-20 ENCOUNTER — Other Ambulatory Visit: Payer: Self-pay

## 2017-08-20 ENCOUNTER — Encounter (HOSPITAL_COMMUNITY): Payer: Self-pay

## 2017-08-20 VITALS — BP 135/54 | HR 66 | Temp 97.7°F | Resp 18 | Wt 161.4 lb

## 2017-08-20 DIAGNOSIS — C50412 Malignant neoplasm of upper-outer quadrant of left female breast: Secondary | ICD-10-CM

## 2017-08-20 DIAGNOSIS — Z170 Estrogen receptor positive status [ER+]: Principal | ICD-10-CM

## 2017-08-20 DIAGNOSIS — Z5111 Encounter for antineoplastic chemotherapy: Secondary | ICD-10-CM | POA: Diagnosis not present

## 2017-08-20 DIAGNOSIS — C50419 Malignant neoplasm of upper-outer quadrant of unspecified female breast: Secondary | ICD-10-CM

## 2017-08-20 LAB — COMPREHENSIVE METABOLIC PANEL
ALT: 19 U/L (ref 14–54)
AST: 19 U/L (ref 15–41)
Albumin: 3.5 g/dL (ref 3.5–5.0)
Alkaline Phosphatase: 51 U/L (ref 38–126)
Anion gap: 7 (ref 5–15)
BILIRUBIN TOTAL: 0.3 mg/dL (ref 0.3–1.2)
BUN: 11 mg/dL (ref 6–20)
CO2: 26 mmol/L (ref 22–32)
CREATININE: 1.18 mg/dL — AB (ref 0.44–1.00)
Calcium: 9 mg/dL (ref 8.9–10.3)
Chloride: 108 mmol/L (ref 101–111)
GFR calc Af Amer: 56 mL/min — ABNORMAL LOW (ref >60)
GFR, EST NON AFRICAN AMERICAN: 48 mL/min — AB (ref >60)
Glucose, Bld: 133 mg/dL — ABNORMAL HIGH (ref 65–99)
Potassium: 4.2 mmol/L (ref 3.5–5.1)
Sodium: 141 mmol/L (ref 135–145)
TOTAL PROTEIN: 6.8 g/dL (ref 6.5–8.1)

## 2017-08-20 LAB — CBC WITH DIFFERENTIAL/PLATELET
BASOS ABS: 0 10*3/uL (ref 0.0–0.1)
Basophils Relative: 1 %
EOS ABS: 0 10*3/uL (ref 0.0–0.7)
EOS PCT: 0 %
HCT: 28.8 % — ABNORMAL LOW (ref 36.0–46.0)
Hemoglobin: 9.7 g/dL — ABNORMAL LOW (ref 12.0–15.0)
Lymphocytes Relative: 13 %
Lymphs Abs: 1.1 10*3/uL (ref 0.7–4.0)
MCH: 29.6 pg (ref 26.0–34.0)
MCHC: 33.7 g/dL (ref 30.0–36.0)
MCV: 87.8 fL (ref 78.0–100.0)
Monocytes Absolute: 0.7 10*3/uL (ref 0.1–1.0)
Monocytes Relative: 9 %
Neutro Abs: 6.6 10*3/uL (ref 1.7–7.7)
Neutrophils Relative %: 77 %
Platelets: 291 10*3/uL (ref 150–400)
RBC: 3.28 MIL/uL — AB (ref 3.87–5.11)
RDW: 12.5 % (ref 11.5–15.5)
WBC: 8.5 10*3/uL (ref 4.0–10.5)

## 2017-08-20 MED ORDER — PALONOSETRON HCL INJECTION 0.25 MG/5ML
INTRAVENOUS | Status: AC
  Filled 2017-08-20: qty 5

## 2017-08-20 MED ORDER — PALONOSETRON HCL INJECTION 0.25 MG/5ML
0.2500 mg | Freq: Once | INTRAVENOUS | Status: AC
  Administered 2017-08-20: 0.25 mg via INTRAVENOUS

## 2017-08-20 MED ORDER — SODIUM CHLORIDE 0.9 % IV SOLN
Freq: Once | INTRAVENOUS | Status: AC
  Administered 2017-08-20: 13:00:00 via INTRAVENOUS

## 2017-08-20 MED ORDER — SODIUM CHLORIDE 0.9% FLUSH
10.0000 mL | INTRAVENOUS | Status: DC | PRN
  Administered 2017-08-20: 10 mL
  Filled 2017-08-20: qty 10

## 2017-08-20 MED ORDER — SODIUM CHLORIDE 0.9 % IV SOLN
600.0000 mg/m2 | Freq: Once | INTRAVENOUS | Status: AC
  Administered 2017-08-20: 1100 mg via INTRAVENOUS
  Filled 2017-08-20: qty 5

## 2017-08-20 MED ORDER — DOXORUBICIN HCL CHEMO IV INJECTION 2 MG/ML
60.0000 mg/m2 | Freq: Once | INTRAVENOUS | Status: AC
  Administered 2017-08-20: 110 mg via INTRAVENOUS
  Filled 2017-08-20: qty 55

## 2017-08-20 MED ORDER — HEPARIN SOD (PORK) LOCK FLUSH 100 UNIT/ML IV SOLN
500.0000 [IU] | Freq: Once | INTRAVENOUS | Status: AC | PRN
  Administered 2017-08-20: 500 [IU]

## 2017-08-20 MED ORDER — FOSAPREPITANT DIMEGLUMINE INJECTION 150 MG
Freq: Once | INTRAVENOUS | Status: AC
  Administered 2017-08-20: 13:00:00 via INTRAVENOUS
  Filled 2017-08-20: qty 5

## 2017-08-20 NOTE — Patient Instructions (Signed)
[facility] Discharge Instructions for Patients Receiving Chemotherapy   Beginning January 23rd 2017 lab work for the Cancer Center will be done in the  Main lab at [facility] on 1st floor. If you have a lab appointment with the Cancer Center please come in thru the  Main Entrance and check in at the main information desk   Today you received the following chemotherapy agents   To help prevent nausea and vomiting after your treatment, we encourage you to take your nausea medication     If you develop nausea and vomiting, or diarrhea that is not controlled by your medication, call the clinic.  The clinic phone number is [phone]. Office hours are Monday-Friday 8:30am-5:00pm.  BELOW ARE SYMPTOMS THAT SHOULD BE REPORTED IMMEDIATELY:  *FEVER GREATER THAN 101.0 F  *CHILLS WITH OR WITHOUT FEVER  NAUSEA AND VOMITING THAT IS NOT CONTROLLED WITH YOUR NAUSEA MEDICATION  *UNUSUAL SHORTNESS OF BREATH  *UNUSUAL BRUISING OR BLEEDING  TENDERNESS IN MOUTH AND THROAT WITH OR WITHOUT PRESENCE OF ULCERS  *URINARY PROBLEMS  *BOWEL PROBLEMS  UNUSUAL RASH Items with * indicate a potential emergency and should be followed up as soon as possible. If you have an emergency after office hours please contact your primary care physician or go to the nearest emergency department.  Please call the clinic during office hours if you have any questions or concerns.   You may also contact the Patient Navigator at [phone] should you have any questions or need assistance in obtaining follow up care.      Resources For Cancer Patients and their Caregivers ? American Cancer Society: Can assist with transportation, wigs, general needs, runs Look Good Feel Better.        1-888-227-6333 ? Cancer Care: Provides financial assistance, online support groups, medication/co-pay assistance.  1-800-813-HOPE (4673) ? [facility] Assists [place] cancer  patients and their families through emotional , educational and financial support.  336-427-4357 ? [place] DSS Where to apply for food stamps, Medicaid and utility assistance. 336-342-1394 ? RCATS: Transportation to medical appointments. 336-347-2287 ? Social Security Administration: May apply for disability if have a Stage IV cancer. 336-342-7796 1-800-772-1213 ? [place] Aging, Disability and Transit Services: Assists with nutrition, care and transit needs. 336-349-2343         

## 2017-08-20 NOTE — Progress Notes (Signed)
Labs reviewed with Dr. Delton Coombes and will proceed with treatment today.   Treatment given per orders. Patient tolerated it well without problems. Vitals stable and discharged home from clinic ambulatory. Follow up as scheduled.

## 2017-08-22 ENCOUNTER — Encounter (HOSPITAL_COMMUNITY): Payer: Self-pay

## 2017-08-22 ENCOUNTER — Inpatient Hospital Stay (HOSPITAL_COMMUNITY): Payer: Medicaid Other

## 2017-08-22 VITALS — BP 140/62 | HR 63 | Temp 97.7°F | Resp 16

## 2017-08-22 DIAGNOSIS — Z170 Estrogen receptor positive status [ER+]: Principal | ICD-10-CM

## 2017-08-22 DIAGNOSIS — C50412 Malignant neoplasm of upper-outer quadrant of left female breast: Secondary | ICD-10-CM

## 2017-08-22 DIAGNOSIS — Z5111 Encounter for antineoplastic chemotherapy: Secondary | ICD-10-CM | POA: Diagnosis not present

## 2017-08-22 MED ORDER — PEGFILGRASTIM-CBQV 6 MG/0.6ML ~~LOC~~ SOSY
6.0000 mg | PREFILLED_SYRINGE | Freq: Once | SUBCUTANEOUS | Status: AC
  Administered 2017-08-22: 6 mg via SUBCUTANEOUS
  Filled 2017-08-22: qty 0.6

## 2017-08-22 NOTE — Progress Notes (Signed)
Patient tolerated udenyca with no complaints voiced.  Denied fevers and chills.  No complaints of SOB or fatigue.  Injection site clean and dry with no bruising or swelling noted at site.  Band aid applied.  VSS with discharge and left ambulatory with no s/s of distress noted.

## 2017-08-22 NOTE — Patient Instructions (Signed)
[facility] at [facility]  Discharge Instructions:  You received a udenyca shot today.  _______________________________________________________________  Thank you for choosing [facility] at [facility] to provide your oncology and hematology care.  To afford each patient quality time with our providers, please arrive at least 15 minutes before your scheduled appointment.  You need to re-schedule your appointment if you arrive 10 or more minutes late.  We strive to give you quality time with our providers, and arriving late affects you and other patients whose appointments are after yours.  Also, if you no show three or more times for appointments you may be dismissed from the clinic.  Again, thank you for choosing [facility] at [facility]. Our hope is that these requests will allow you access to exceptional care and in a timely manner. _______________________________________________________________  If you have questions after your visit, please contact our office at [phone] between the hours of 8:30 a.m. and 5:00 p.m. Voicemails left after 4:30 p.m. will not be returned until the following business day. _______________________________________________________________  For prescription refill requests, have your pharmacy contact our office. _______________________________________________________________  Recommendations made by the consultant and any test results will be sent to your referring physician. _______________________________________________________________ 

## 2017-08-29 ENCOUNTER — Other Ambulatory Visit (HOSPITAL_COMMUNITY): Payer: Self-pay

## 2017-08-29 DIAGNOSIS — Z5111 Encounter for antineoplastic chemotherapy: Secondary | ICD-10-CM

## 2017-08-29 DIAGNOSIS — R7989 Other specified abnormal findings of blood chemistry: Secondary | ICD-10-CM

## 2017-08-29 DIAGNOSIS — C50412 Malignant neoplasm of upper-outer quadrant of left female breast: Secondary | ICD-10-CM

## 2017-08-29 DIAGNOSIS — Z170 Estrogen receptor positive status [ER+]: Principal | ICD-10-CM

## 2017-08-30 ENCOUNTER — Ambulatory Visit (HOSPITAL_COMMUNITY): Payer: Medicaid Other

## 2017-08-30 ENCOUNTER — Ambulatory Visit (HOSPITAL_COMMUNITY): Payer: Medicaid Other | Admitting: Hematology

## 2017-08-30 ENCOUNTER — Other Ambulatory Visit (HOSPITAL_COMMUNITY): Payer: Medicaid Other

## 2017-08-31 ENCOUNTER — Ambulatory Visit (HOSPITAL_COMMUNITY): Payer: Medicaid Other

## 2017-09-03 ENCOUNTER — Inpatient Hospital Stay (HOSPITAL_COMMUNITY): Payer: Medicaid Other | Attending: Hematology

## 2017-09-03 ENCOUNTER — Encounter (HOSPITAL_COMMUNITY): Payer: Self-pay | Admitting: Hematology

## 2017-09-03 ENCOUNTER — Inpatient Hospital Stay (HOSPITAL_BASED_OUTPATIENT_CLINIC_OR_DEPARTMENT_OTHER): Payer: Medicaid Other | Admitting: Hematology

## 2017-09-03 ENCOUNTER — Other Ambulatory Visit: Payer: Self-pay

## 2017-09-03 ENCOUNTER — Inpatient Hospital Stay (HOSPITAL_COMMUNITY): Payer: Medicaid Other

## 2017-09-03 VITALS — BP 133/51 | HR 71 | Temp 98.2°F | Resp 18 | Wt 158.8 lb

## 2017-09-03 VITALS — BP 152/67 | HR 80 | Temp 97.7°F | Resp 18

## 2017-09-03 DIAGNOSIS — Z170 Estrogen receptor positive status [ER+]: Principal | ICD-10-CM

## 2017-09-03 DIAGNOSIS — C50412 Malignant neoplasm of upper-outer quadrant of left female breast: Secondary | ICD-10-CM

## 2017-09-03 DIAGNOSIS — Z5189 Encounter for other specified aftercare: Secondary | ICD-10-CM | POA: Diagnosis not present

## 2017-09-03 DIAGNOSIS — D649 Anemia, unspecified: Secondary | ICD-10-CM | POA: Insufficient documentation

## 2017-09-03 DIAGNOSIS — R197 Diarrhea, unspecified: Secondary | ICD-10-CM

## 2017-09-03 DIAGNOSIS — I10 Essential (primary) hypertension: Secondary | ICD-10-CM | POA: Diagnosis not present

## 2017-09-03 DIAGNOSIS — Z5111 Encounter for antineoplastic chemotherapy: Secondary | ICD-10-CM | POA: Insufficient documentation

## 2017-09-03 DIAGNOSIS — R309 Painful micturition, unspecified: Secondary | ICD-10-CM | POA: Insufficient documentation

## 2017-09-03 DIAGNOSIS — R7989 Other specified abnormal findings of blood chemistry: Secondary | ICD-10-CM

## 2017-09-03 LAB — COMPREHENSIVE METABOLIC PANEL
ALT: 16 U/L (ref 14–54)
ANION GAP: 8 (ref 5–15)
AST: 17 U/L (ref 15–41)
Albumin: 3.6 g/dL (ref 3.5–5.0)
Alkaline Phosphatase: 56 U/L (ref 38–126)
BUN: 8 mg/dL (ref 6–20)
CHLORIDE: 107 mmol/L (ref 101–111)
CO2: 26 mmol/L (ref 22–32)
Calcium: 9.1 mg/dL (ref 8.9–10.3)
Creatinine, Ser: 0.97 mg/dL (ref 0.44–1.00)
GFR calc non Af Amer: >60 mL/min (ref >60)
Glucose, Bld: 129 mg/dL — ABNORMAL HIGH (ref 65–99)
Potassium: 3.4 mmol/L — ABNORMAL LOW (ref 3.5–5.1)
SODIUM: 141 mmol/L (ref 135–145)
Total Bilirubin: 0.3 mg/dL (ref 0.3–1.2)
Total Protein: 6.9 g/dL (ref 6.5–8.1)

## 2017-09-03 LAB — CBC WITH DIFFERENTIAL/PLATELET
BASOS PCT: 0 %
Basophils Absolute: 0 10*3/uL (ref 0.0–0.1)
EOS ABS: 0 10*3/uL (ref 0.0–0.7)
EOS PCT: 0 %
HCT: 25 % — ABNORMAL LOW (ref 36.0–46.0)
Hemoglobin: 8.6 g/dL — ABNORMAL LOW (ref 12.0–15.0)
Lymphocytes Relative: 12 %
Lymphs Abs: 0.8 10*3/uL (ref 0.7–4.0)
MCH: 30.8 pg (ref 26.0–34.0)
MCHC: 34.4 g/dL (ref 30.0–36.0)
MCV: 89.6 fL (ref 78.0–100.0)
MONO ABS: 0.8 10*3/uL (ref 0.1–1.0)
MONOS PCT: 12 %
Neutro Abs: 5 10*3/uL (ref 1.7–7.7)
Neutrophils Relative %: 76 %
PLATELETS: 193 10*3/uL (ref 150–400)
RBC: 2.79 MIL/uL — ABNORMAL LOW (ref 3.87–5.11)
RDW: 14.2 % (ref 11.5–15.5)
WBC: 6.5 10*3/uL (ref 4.0–10.5)

## 2017-09-03 MED ORDER — SODIUM CHLORIDE 0.9 % IV SOLN
Freq: Once | INTRAVENOUS | Status: AC
  Administered 2017-09-03: 12:00:00 via INTRAVENOUS

## 2017-09-03 MED ORDER — HEPARIN SOD (PORK) LOCK FLUSH 100 UNIT/ML IV SOLN
500.0000 [IU] | Freq: Once | INTRAVENOUS | Status: AC | PRN
  Administered 2017-09-03: 500 [IU]
  Filled 2017-09-03: qty 5

## 2017-09-03 MED ORDER — SODIUM CHLORIDE 0.9 % IV SOLN
INTRAVENOUS | Status: DC
  Administered 2017-09-03: 13:00:00 via INTRAVENOUS

## 2017-09-03 MED ORDER — PALONOSETRON HCL INJECTION 0.25 MG/5ML
0.2500 mg | Freq: Once | INTRAVENOUS | Status: AC
  Administered 2017-09-03: 0.25 mg via INTRAVENOUS
  Filled 2017-09-03: qty 5

## 2017-09-03 MED ORDER — SODIUM CHLORIDE 0.9% FLUSH
10.0000 mL | INTRAVENOUS | Status: DC | PRN
  Administered 2017-09-03: 10 mL
  Filled 2017-09-03: qty 10

## 2017-09-03 MED ORDER — SODIUM CHLORIDE 0.9 % IV SOLN
600.0000 mg/m2 | Freq: Once | INTRAVENOUS | Status: AC
  Administered 2017-09-03: 1100 mg via INTRAVENOUS
  Filled 2017-09-03: qty 50

## 2017-09-03 MED ORDER — DOXORUBICIN HCL CHEMO IV INJECTION 2 MG/ML
60.0000 mg/m2 | Freq: Once | INTRAVENOUS | Status: AC
  Administered 2017-09-03: 110 mg via INTRAVENOUS
  Filled 2017-09-03: qty 55

## 2017-09-03 MED ORDER — SODIUM CHLORIDE 0.9 % IV SOLN
Freq: Once | INTRAVENOUS | Status: AC
  Administered 2017-09-03: 12:00:00 via INTRAVENOUS
  Filled 2017-09-03: qty 5

## 2017-09-03 NOTE — Assessment & Plan Note (Signed)
1.  Poorly differentiated left breast cancer stage IIIb, (cT3cN1), ER positive, PR and HER-2 negative: - On 06/12/2017, left breast biopsy with sarcomatoid features, left axillary lymph node biopsy negative.  2D echo shows EF of 55-60%. - CT of the chest showed subcarinal adenopathy, PET CT scan negative for metastatic disease. -Cycle 1 of dose dense Adriamycin and cyclophosphamide started on 07/18/2017. -She has completed 3 cycles.  She had diarrhea for 3 days and felt lightheaded twice.  She was advised to take Imodium when diarrhea happens.  She will proceed with cycle 4 of dose dense AC today.  She will come back in 2 weeks to start weekly Taxol.  Her creatinine today is normal after we discontinued Maxide.  2.  Hypertension: She is taking metoprolol 50 mg twice daily.  We have discontinued Maxide because of elevated creatinine.  She is taking Norvasc 5 mg daily without any problems.  Blood pressure today is well controlled at 133.

## 2017-09-03 NOTE — Progress Notes (Signed)
1130 Labs reviewed with and pt seen by Dr. Delton Coombes and pt approved for chemo tx today per MD                                                                                  Lisa Thornton tolerated chemo tx well without complaints or incident. VSS upon discharge. Pt discharged self ambulatory in satisfactory condition accompanied by family member

## 2017-09-03 NOTE — Patient Instructions (Signed)
[facility] Discharge Instructions for Patients Receiving Chemotherapy   Beginning January 23rd 2017 lab work for the Cancer Center will be done in the  Main lab at [facility] on 1st floor. If you have a lab appointment with the Cancer Center please come in thru the  Main Entrance and check in at the main information desk   Today you received the following chemotherapy agents Adraimycin and Cytoxan. Follow-up as scheduled. Call clinic for any questions or concerns  To help prevent nausea and vomiting after your treatment, we encourage you to take your nausea medication   If you develop nausea and vomiting, or diarrhea that is not controlled by your medication, call the clinic.  The clinic phone number is [phone]. Office hours are Monday-Friday 8:30am-5:00pm.  BELOW ARE SYMPTOMS THAT SHOULD BE REPORTED IMMEDIATELY:  *FEVER GREATER THAN 101.0 F  *CHILLS WITH OR WITHOUT FEVER  NAUSEA AND VOMITING THAT IS NOT CONTROLLED WITH YOUR NAUSEA MEDICATION  *UNUSUAL SHORTNESS OF BREATH  *UNUSUAL BRUISING OR BLEEDING  TENDERNESS IN MOUTH AND THROAT WITH OR WITHOUT PRESENCE OF ULCERS  *URINARY PROBLEMS  *BOWEL PROBLEMS  UNUSUAL RASH Items with * indicate a potential emergency and should be followed up as soon as possible. If you have an emergency after office hours please contact your primary care physician or go to the nearest emergency department.  Please call the clinic during office hours if you have any questions or concerns.   You may also contact the Patient Navigator at [phone] should you have any questions or need assistance in obtaining follow up care.      Resources For Cancer Patients and their Caregivers ? American Cancer Society: Can assist with transportation, wigs, general needs, runs Look Good Feel Better.        1-888-227-6333 ? Cancer Care: Provides financial assistance, online support groups, medication/co-pay assistance.   1-800-813-HOPE (4673) ? [facility] Assists [place] cancer patients and their families through emotional , educational and financial support.  336-427-4357 ? [place] DSS Where to apply for food stamps, Medicaid and utility assistance. 336-342-1394 ? RCATS: Transportation to medical appointments. 336-347-2287 ? Social Security Administration: May apply for disability if have a Stage IV cancer. 336-342-7796 1-800-772-1213 ? [place] Aging, Disability and Transit Services: Assists with nutrition, care and transit needs. 336-349-2343         

## 2017-09-03 NOTE — Progress Notes (Signed)
[place], [place] [address]   CLINIC:  Medical Oncology/Hematology  PCP:  Patient, No Pcp Per No address on file None   REASON FOR VISIT:  Follow-up for neoadjuvant chemotherapy for breast cancer.  CURRENT THERAPY: Dose dense AC  BRIEF ONCOLOGIC HISTORY:    Breast cancer of upper-outer quadrant of left female breast ([place])   06/22/2017 Initial Diagnosis    Breast cancer of upper-outer quadrant of left female breast ([place])      06/22/2017 Cancer Staging    Staging form: Breast, AJCC 8th Edition - Clinical stage from 06/22/2017: Stage IIIB (cT3, cN1, cM0, G3, ER+, PR-, HER2-) - Signed by Derek Jack, MD on 06/22/2017      06/29/2017 Imaging    CT chest showing left axillary adenopathy, 1.2 cm anterior carinal adenopathy, left subpectoral lymph node subcentimeter  06/25/2017 2D echocardiogram with ejection fraction of 55-60%      07/16/2017 -  Chemotherapy    The patient had DOXOrubicin (ADRIAMYCIN) chemo injection 110 mg, 60 mg/m2 = 110 mg, Intravenous,  Once, 4 of 4 cycles Administration: 110 mg (07/18/2017), 110 mg (08/01/2017), 110 mg (08/20/2017), 110 mg (09/03/2017) palonosetron (ALOXI) injection 0.25 mg, 0.25 mg, Intravenous,  Once, 4 of 4 cycles Administration: 0.25 mg (07/18/2017), 0.25 mg (08/01/2017), 0.25 mg (08/20/2017), 0.25 mg (09/03/2017) pegfilgrastim-cbqv (UDENYCA) injection 6 mg, 6 mg, Subcutaneous, Once, 4 of 4 cycles Administration: 6 mg (07/19/2017), 6 mg (08/02/2017), 6 mg (08/22/2017) cyclophosphamide (CYTOXAN) 1,100 mg in sodium chloride 0.9 % 250 mL chemo infusion, 600 mg/m2 = 1,100 mg, Intravenous,  Once, 4 of 4 cycles Administration: 1,100 mg (07/18/2017), 1,100 mg (08/01/2017), 1,100 mg (08/20/2017), 1,100 mg (09/03/2017) PACLitaxel (TAXOL) 144 mg in dextrose 5 % 250 mL chemo infusion (</= '80mg'$ /m2), 80 mg/m2, Intravenous,  Once, 0 of 12 cycles fosaprepitant (EMEND) 150 mg, dexamethasone (DECADRON) 12 mg in sodium chloride 0.9 %  145 mL IVPB, , Intravenous,  Once, 4 of 4 cycles Administration:  (07/18/2017),  (08/01/2017),  (08/20/2017),  (09/03/2017)  for chemotherapy treatment.         CANCER STAGING: Cancer Staging Breast cancer of upper-outer quadrant of left female breast [facility]) Staging form: Breast, AJCC 8th Edition - Clinical stage from 06/22/2017: Stage IIIB (cT3, cN1, cM0, G3, ER+, PR-, HER2-) - Signed by Derek Jack, MD on 06/22/2017    INTERVAL HISTORY:  Ms. Schoenfelder 62 y.o. female returns for routine follow-up and consideration for next cycle of chemotherapy.  She is here for cycle 4 of dose dense AC.  After cycle 3 she had diarrhea that lasted about 3 days.  She had up to 3 loose stools per day.  She had 2 episodes of lightheadedness when she had diarrhea.  Denies any nausea or vomiting.  Complains of tiredness.  Denies any fevers or infections.  No ER visits or hospitalizations.    REVIEW OF SYSTEMS:  Review of Systems  Constitutional: Positive for fatigue.  Gastrointestinal: Positive for diarrhea.  Neurological: Positive for dizziness.  All other systems reviewed and are negative.    PAST MEDICAL/SURGICAL HISTORY:  Past Medical History:  Diagnosis Date  . Cancer [facility])    left breast cancer  . Chest pain   . Hypertension    Past Surgical History:  Procedure Laterality Date  . PORTACATH PLACEMENT Right 06/27/2017   Procedure: INSERTION PORT-A-CATH;  Surgeon: Aviva Signs, MD;  Location: [facility];  Service: General;  Laterality: Right;     SOCIAL HISTORY:  Social History  Socioeconomic History  . Marital status: Divorced    Spouse name: Not on file  . Number of children: Not on file  . Years of education: Not on file  . Highest education level: Not on file  Occupational History  . Not on file  Social Needs  . Financial resource strain: Somewhat hard  . Food insecurity:    Worry: Never true    Inability: Never true  . Transportation needs:    Medical: No    Non-medical: No    Tobacco Use  . Smoking status: Never Smoker  . Smokeless tobacco: Never Used  Substance and Sexual Activity  . Alcohol use: No  . Drug use: No  . Sexual activity: Not Currently  Lifestyle  . Physical activity:    Days per week: 3 days    Minutes per session: 10 min  . Stress: Very much  Relationships  . Social connections:    Talks on phone: More than three times a week    Gets together: More than three times a week    Attends religious service: More than 4 times per year    Active member of club or organization: Yes    Attends meetings of clubs or organizations: Never    Relationship status: Divorced  . Intimate partner violence:    Fear of current or ex partner: Patient refused    Emotionally abused: Patient refused    Physically abused: Patient refused    Forced sexual activity: Patient refused  Other Topics Concern  . Not on file  Social History Narrative  . Not on file    FAMILY HISTORY:  Family History  Problem Relation Age of Onset  . Breast cancer Mother   . Thyroid disease Mother   . Heart disease Mother   . Heart attack Father   . Heart attack Sister   . Hypertension Sister   . Heart attack Brother   . Cancer Sister   . Stroke Brother   . Alzheimer's disease Maternal Aunt     CURRENT MEDICATIONS:  Outpatient Encounter Medications as of 09/03/2017  Medication Sig  . amLODipine (NORVASC) 5 MG tablet Take 1 tablet (5 mg total) by mouth daily.  . cyclophosphamide (CYTOXAN) 2 g chemo injection Inject into the vein once. Every 2 weeks x 4 cycles  . DOXOrubicin HCl (ADRIAMYCIN IV) Inject into the vein. Every 2 weeks x 4 cycles  . HYDROcodone-acetaminophen (NORCO) 5-325 MG tablet Take 1 tablet by mouth every 6 (six) hours as needed for moderate pain.  . metoprolol tartrate (LOPRESSOR) 50 MG tablet Take 50 mg by mouth 2 (two) times daily.  . ondansetron (ZOFRAN) 8 MG tablet Take 1 tablet (8 mg total) by mouth 2 (two) times daily as needed. Start on the third day  after chemotherapy.  . pegfilgrastim-cbqv (UDENYCA) 6 MG/0.6ML injection Inject 6 mg into the skin once. Every 2 weeks x 4 cycles  . prochlorperazine (COMPAZINE) 10 MG tablet Take 1 tablet (10 mg total) by mouth every 6 (six) hours as needed (Nausea or vomiting).  . triamterene-hydrochlorothiazide (MAXZIDE-25) 37.5-25 MG tablet    No facility-administered encounter medications on file as of 09/03/2017.     ALLERGIES:  No Known Allergies   PHYSICAL EXAM:  ECOG Performance status: 1  Vitals:   09/03/17 1022  BP: (!) 133/51  Pulse: 71  Resp: 18  Temp: 98.2 F (36.8 C)  SpO2: 100%   Filed Weights   09/03/17 1022  Weight: 158 lb 12.8 oz (  72 kg)    Physical Exam   LABORATORY DATA:  I have reviewed the labs as listed.  CBC    Component Value Date/Time   WBC 6.5 09/03/2017 0947   RBC 2.79 (L) 09/03/2017 0947   HGB 8.6 (L) 09/03/2017 0947   HCT 25.0 (L) 09/03/2017 0947   PLT 193 09/03/2017 0947   MCV 89.6 09/03/2017 0947   MCH 30.8 09/03/2017 0947   MCHC 34.4 09/03/2017 0947   RDW 14.2 09/03/2017 0947   LYMPHSABS 0.8 09/03/2017 0947   MONOABS 0.8 09/03/2017 0947   EOSABS 0.0 09/03/2017 0947   BASOSABS 0.0 09/03/2017 0947   CMP Latest Ref Rng & Units 09/03/2017 08/20/2017 08/17/2017  Glucose 65 - 99 mg/dL 129(H) 133(H) 128(H)  BUN 6 - 20 mg/dL '8 11 16  '$ Creatinine 0.44 - 1.00 mg/dL 0.97 1.18(H) 1.43(H)  Sodium 135 - 145 mmol/L 141 141 139  Potassium 3.5 - 5.1 mmol/L 3.4(L) 4.2 3.9  Chloride 101 - 111 mmol/L 107 108 105  CO2 22 - 32 mmol/L '26 26 26  '$ Calcium 8.9 - 10.3 mg/dL 9.1 9.0 8.9  Total Protein 6.5 - 8.1 g/dL 6.9 6.8 -  Total Bilirubin 0.3 - 1.2 mg/dL 0.3 0.3 -  Alkaline Phos 38 - 126 U/L 56 51 -  AST 15 - 41 U/L 17 19 -  ALT 14 - 54 U/L 16 19 -         ASSESSMENT & PLAN:   Breast cancer of upper-outer quadrant of left female breast ([place]) 1.  Poorly differentiated left breast cancer stage IIIb, (cT3cN1), ER positive, PR and HER-2 negative: - On  06/12/2017, left breast biopsy with sarcomatoid features, left axillary lymph node biopsy negative.  2D echo shows EF of 55-60%. - CT of the chest showed subcarinal adenopathy, PET CT scan negative for metastatic disease. -Cycle 1 of dose dense Adriamycin and cyclophosphamide started on 07/18/2017. -She has completed 3 cycles.  She had diarrhea for 3 days and felt lightheaded twice.  She was advised to take Imodium when diarrhea happens.  She will proceed with cycle 4 of dose dense AC today.  She will come back in 2 weeks to start weekly Taxol.  Her creatinine today is normal after we discontinued Maxide.  2.  Hypertension: She is taking metoprolol 50 mg twice daily.  We have discontinued Maxide because of elevated creatinine.  She is taking Norvasc 5 mg daily without any problems.  Blood pressure today is well controlled at 133.        Orders placed this encounter:  Orders Placed This Encounter  Procedures  . CBC with Differential  . Comprehensive metabolic panel      Derek Jack, MD [place] [phone]

## 2017-09-05 ENCOUNTER — Other Ambulatory Visit: Payer: Self-pay

## 2017-09-05 ENCOUNTER — Encounter (HOSPITAL_COMMUNITY): Payer: Self-pay

## 2017-09-05 ENCOUNTER — Inpatient Hospital Stay (HOSPITAL_COMMUNITY): Payer: Medicaid Other

## 2017-09-05 VITALS — BP 106/53 | HR 56 | Temp 98.4°F | Resp 18

## 2017-09-05 DIAGNOSIS — Z170 Estrogen receptor positive status [ER+]: Principal | ICD-10-CM

## 2017-09-05 DIAGNOSIS — C50412 Malignant neoplasm of upper-outer quadrant of left female breast: Secondary | ICD-10-CM

## 2017-09-05 DIAGNOSIS — Z5111 Encounter for antineoplastic chemotherapy: Secondary | ICD-10-CM | POA: Diagnosis not present

## 2017-09-05 MED ORDER — PEGFILGRASTIM-CBQV 6 MG/0.6ML ~~LOC~~ SOSY
6.0000 mg | PREFILLED_SYRINGE | Freq: Once | SUBCUTANEOUS | Status: AC
  Administered 2017-09-05: 6 mg via SUBCUTANEOUS

## 2017-09-05 NOTE — Progress Notes (Signed)
Lisa Thornton presents today for injection per the provider's orders.  Udenyca administration without incident; see MAR for injection details.  Patient tolerated procedure well and without incident.  No questions or complaints noted at this time.  Discharged ambulatory in c/o family.

## 2017-09-12 ENCOUNTER — Encounter (HOSPITAL_COMMUNITY): Payer: Self-pay

## 2017-09-12 ENCOUNTER — Inpatient Hospital Stay (HOSPITAL_COMMUNITY): Payer: Medicaid Other

## 2017-09-12 ENCOUNTER — Other Ambulatory Visit: Payer: Self-pay

## 2017-09-12 VITALS — BP 116/43 | HR 80 | Temp 98.3°F | Resp 18

## 2017-09-12 DIAGNOSIS — T451X5A Adverse effect of antineoplastic and immunosuppressive drugs, initial encounter: Secondary | ICD-10-CM

## 2017-09-12 DIAGNOSIS — D6481 Anemia due to antineoplastic chemotherapy: Secondary | ICD-10-CM

## 2017-09-12 DIAGNOSIS — Z5111 Encounter for antineoplastic chemotherapy: Secondary | ICD-10-CM | POA: Diagnosis not present

## 2017-09-12 DIAGNOSIS — E860 Dehydration: Secondary | ICD-10-CM

## 2017-09-12 LAB — COMPREHENSIVE METABOLIC PANEL
ALT: 12 U/L — ABNORMAL LOW (ref 14–54)
ANION GAP: 11 (ref 5–15)
AST: 15 U/L (ref 15–41)
Albumin: 3.2 g/dL — ABNORMAL LOW (ref 3.5–5.0)
Alkaline Phosphatase: 56 U/L (ref 38–126)
BILIRUBIN TOTAL: 1 mg/dL (ref 0.3–1.2)
BUN: 15 mg/dL (ref 6–20)
CO2: 22 mmol/L (ref 22–32)
Calcium: 8.8 mg/dL — ABNORMAL LOW (ref 8.9–10.3)
Chloride: 102 mmol/L (ref 101–111)
Creatinine, Ser: 1 mg/dL (ref 0.44–1.00)
GFR calc Af Amer: >60 mL/min (ref >60)
GFR, EST NON AFRICAN AMERICAN: 59 mL/min — AB (ref >60)
Glucose, Bld: 183 mg/dL — ABNORMAL HIGH (ref 65–99)
POTASSIUM: 3.5 mmol/L (ref 3.5–5.1)
Sodium: 135 mmol/L (ref 135–145)
TOTAL PROTEIN: 6.2 g/dL — AB (ref 6.5–8.1)

## 2017-09-12 LAB — CBC WITH DIFFERENTIAL/PLATELET
BASOS ABS: 0 10*3/uL (ref 0.0–0.1)
BASOS PCT: 2 %
EOS PCT: 0 %
Eosinophils Absolute: 0 10*3/uL (ref 0.0–0.7)
HEMATOCRIT: 16 % — AB (ref 36.0–46.0)
Hemoglobin: 5.6 g/dL — CL (ref 12.0–15.0)
LYMPHS PCT: 26 %
Lymphs Abs: 0.1 10*3/uL (ref 0.7–4.0)
MCH: 30.8 pg (ref 26.0–34.0)
MCHC: 35 g/dL (ref 30.0–36.0)
MCV: 87.9 fL (ref 78.0–100.0)
Monocytes Absolute: 0.1 10*3/uL (ref 0.1–1.0)
Monocytes Relative: 11 %
Neutro Abs: 0.3 10*3/uL (ref 1.7–7.7)
Neutrophils Relative %: 61 %
PLATELETS: 16 10*3/uL — AB (ref 150–400)
RBC: 1.82 MIL/uL — AB (ref 3.87–5.11)
RDW: 14.8 % (ref 11.5–15.5)
WBC: 0.5 10*3/uL — AB (ref 4.0–10.5)

## 2017-09-12 LAB — PREPARE RBC (CROSSMATCH)

## 2017-09-12 LAB — MAGNESIUM: Magnesium: 1.4 mg/dL — ABNORMAL LOW (ref 1.7–2.4)

## 2017-09-12 LAB — ABO/RH: ABO/RH(D): O POS

## 2017-09-12 MED ORDER — SODIUM CHLORIDE 0.9 % IV SOLN
INTRAVENOUS | Status: DC
  Administered 2017-09-12: 15:00:00 via INTRAVENOUS

## 2017-09-12 MED ORDER — DIPHENHYDRAMINE HCL 25 MG PO CAPS
25.0000 mg | ORAL_CAPSULE | Freq: Once | ORAL | Status: AC
  Administered 2017-09-12: 25 mg via ORAL
  Filled 2017-09-12: qty 1

## 2017-09-12 MED ORDER — ACETAMINOPHEN 325 MG PO TABS
650.0000 mg | ORAL_TABLET | Freq: Once | ORAL | Status: AC
  Administered 2017-09-12: 650 mg via ORAL
  Filled 2017-09-12: qty 2

## 2017-09-12 MED ORDER — SODIUM CHLORIDE 0.9 % IV SOLN
Freq: Once | INTRAVENOUS | Status: AC
  Administered 2017-09-12: 13:00:00 via INTRAVENOUS
  Filled 2017-09-12: qty 1000

## 2017-09-12 NOTE — Progress Notes (Signed)
CRITICAL VALUE ALERT Critical value received:  WBC-0.5 Date of notification:  09/12/17 Time of notification: 1587 Critical value read back:  Yes.   Nurse who received alert:  M.Chadwick Reiswig,LPN MD notified (1st page):  S.Katragadda, MD  CRITICAL VALUE ALERT Critical value received:  HGB-5.6 Date of notification:  09/12/17 Time of notification: 2761 Critical value read back:  Yes.   Nurse who received alert:  M.Rayna Brenner,LPN MD notified (1st page):  S.Katragadda, MD  CRITICAL VALUE ALERT Critical value received:  Platelets-16 Date of notification:  09/12/17 Time of notification: 8485 Critical value read back:  Yes.   Nurse who received alert:  M.Elener Custodio,LPN MD notified (1st page):  S.Katragadda, MD

## 2017-09-12 NOTE — Progress Notes (Signed)
Pt presents today c/o generalized weakness, dizziness (especially with postural changes) onset 3 days ago.  Reports diarrhea that is controlled with Imodium.  Reports nausea that is controlled with her antiemetics. States she is drinking "lots of" fluids.  Denies pain, shortness of breath, chest pain.  Denies any bleeding.  Last tx with AC 9 days ago.  Hgb today noted to be 5.6, ANC 300, and platelets 16,000.  Dr. Delton Coombes aware of results.  Orders rec'd to transfuse pt with 2 units PRBC and 1 unit of platelets.  Pt will receive 1 unit PRBC today and will return tomorrow for 2nd unit PRBC and 1 unit platelets.  Pt instructed on neutropenic precautions.  Also instructed to report to the ED for any unusual bruising or uncontrolled bleeding, or for worsening s/s r/t her anemia prior to tomorrow should they occur.  She verbalizes understanding of all of the above.   Tolerated transfusion w/o adverse reaction.  Alert, in no distress.  VSS.  Discharged via wheelchair in c/o sister.

## 2017-09-13 ENCOUNTER — Inpatient Hospital Stay (HOSPITAL_COMMUNITY): Payer: Medicaid Other

## 2017-09-13 ENCOUNTER — Encounter (HOSPITAL_COMMUNITY): Payer: Self-pay

## 2017-09-13 DIAGNOSIS — Z5111 Encounter for antineoplastic chemotherapy: Secondary | ICD-10-CM | POA: Diagnosis not present

## 2017-09-13 DIAGNOSIS — D6481 Anemia due to antineoplastic chemotherapy: Secondary | ICD-10-CM

## 2017-09-13 DIAGNOSIS — T451X5A Adverse effect of antineoplastic and immunosuppressive drugs, initial encounter: Principal | ICD-10-CM

## 2017-09-13 MED ORDER — HEPARIN SOD (PORK) LOCK FLUSH 100 UNIT/ML IV SOLN
INTRAVENOUS | Status: AC
  Filled 2017-09-13: qty 5

## 2017-09-13 MED ORDER — ACETAMINOPHEN 325 MG PO TABS
650.0000 mg | ORAL_TABLET | Freq: Once | ORAL | Status: AC
  Administered 2017-09-13: 650 mg via ORAL

## 2017-09-13 MED ORDER — DIPHENHYDRAMINE HCL 25 MG PO CAPS
ORAL_CAPSULE | ORAL | Status: AC
  Filled 2017-09-13: qty 1

## 2017-09-13 MED ORDER — ACETAMINOPHEN 325 MG PO TABS
ORAL_TABLET | ORAL | Status: AC
Start: 2017-09-13 — End: ?
  Filled 2017-09-13: qty 2

## 2017-09-13 MED ORDER — SODIUM CHLORIDE 0.9 % IV SOLN
250.0000 mL | Freq: Once | INTRAVENOUS | Status: AC
  Administered 2017-09-13: 250 mL via INTRAVENOUS

## 2017-09-13 MED ORDER — FULVESTRANT 250 MG/5ML IM SOLN
INTRAMUSCULAR | Status: AC
  Filled 2017-09-13: qty 5

## 2017-09-13 MED ORDER — SODIUM CHLORIDE 0.9% FLUSH
10.0000 mL | INTRAVENOUS | Status: DC | PRN
  Administered 2017-09-13: 10 mL via INTRAVENOUS
  Filled 2017-09-13: qty 10

## 2017-09-13 MED ORDER — HEPARIN SOD (PORK) LOCK FLUSH 100 UNIT/ML IV SOLN
500.0000 [IU] | Freq: Every day | INTRAVENOUS | Status: AC | PRN
  Administered 2017-09-13: 500 [IU]

## 2017-09-13 MED ORDER — DIPHENHYDRAMINE HCL 25 MG PO CAPS
25.0000 mg | ORAL_CAPSULE | Freq: Once | ORAL | Status: AC
  Administered 2017-09-13: 25 mg via ORAL

## 2017-09-13 NOTE — Progress Notes (Signed)
Lisa Thornton tolerated Blood and Platelet transfusions well without complaints or incident. VSS prior to, during and after both units. Pt discharged via wheelchair in satisfactory condition accompanied by family member

## 2017-09-13 NOTE — Patient Instructions (Signed)
[place] at [facility] Discharge Instructions  Received 1 unit of RBC's and 1 unit of Platelets today. Follow-up as scheduled. Call clinic for any questions or concerns   Thank you for choosing [place] at [facility] to provide your oncology and hematology care.  To afford each patient quality time with our provider, please arrive at least 15 minutes before your scheduled appointment time.   If you have a lab appointment with the [facility] [place] please come in thru the  Main Entrance and check in at the main information desk  You need to re-schedule your appointment should you arrive 10 or more minutes late.  We strive to give you quality time with our providers, and arriving late affects you and other patients whose appointments are after yours.  Also, if you no show three or more times for appointments you may be dismissed from the clinic at the providers discretion.     Again, thank you for choosing [facility].  Our hope is that these requests will decrease the amount of time that you wait before being seen by our physicians.       _____________________________________________________________  Should you have questions after your visit to [facility], please contact our office at [phone] between the hours of 8:30 a.m. and 4:30 p.m.  Voicemails left after 4:30 p.m. will not be returned until the following business day.  For prescription refill requests, have your pharmacy contact our office.       Resources For Cancer Patients and their Caregivers ? American Cancer Society: Can assist with transportation, wigs, general needs, runs Look Good Feel Better.        [phone] ? Cancer Care: Provides financial assistance, online support groups, medication/co-pay assistance.  1-800-813-HOPE (939)011-4561) ? [facility] Assists [place] cancer patients and their families through emotional  , educational and financial support.  5062244886 ? [place] DSS Where to apply for food stamps, Medicaid and utility assistance. 617-497-1887 ? RCATS: Transportation to medical appointments. 3324712923 ? Social Security Administration: May apply for disability if have a Stage IV cancer. 7627295708 912-202-4987 ? [employer], Disability and Transit Services: Assists with nutrition, care and transit needs. [place] Support Programs:   > Cancer Support Group  2nd Tuesday of the month 1pm-2pm, Journey Room   > Creative Journey  3rd Tuesday of the month 1130am-1pm, Journey Room

## 2017-09-14 ENCOUNTER — Telehealth (HOSPITAL_COMMUNITY): Payer: Self-pay

## 2017-09-14 ENCOUNTER — Inpatient Hospital Stay (HOSPITAL_COMMUNITY): Payer: Medicaid Other

## 2017-09-14 DIAGNOSIS — R300 Dysuria: Secondary | ICD-10-CM

## 2017-09-14 LAB — BPAM RBC
Blood Product Expiration Date: 201907062359
Blood Product Expiration Date: 201907062359
ISSUE DATE / TIME: 201906121518
ISSUE DATE / TIME: 201906130932
UNIT TYPE AND RH: 5100
Unit Type and Rh: 5100

## 2017-09-14 LAB — TYPE AND SCREEN
ABO/RH(D): O POS
Antibody Screen: NEGATIVE
UNIT DIVISION: 0
Unit division: 0

## 2017-09-14 LAB — PREPARE PLATELET PHERESIS: Unit division: 0

## 2017-09-14 LAB — BPAM PLATELET PHERESIS
Blood Product Expiration Date: 201906132359
ISSUE DATE / TIME: 201906131126
Unit Type and Rh: 5100

## 2017-09-14 MED ORDER — CIPROFLOXACIN HCL 500 MG PO TABS: 500.0000 mg | ORAL_TABLET | Freq: Two times a day (BID) | ORAL | 0 refills | Status: DC

## 2017-09-14 NOTE — Telephone Encounter (Signed)
Patient called stating she has started having burning on urination last evening. Reviewed with MD. Patient has to come to lab for ua and uc and then can start cipro. Prescription sent to patients pharmacy. Notified patient who verbalized understanding.

## 2017-09-17 ENCOUNTER — Inpatient Hospital Stay (HOSPITAL_COMMUNITY): Payer: Medicaid Other

## 2017-09-17 ENCOUNTER — Encounter (HOSPITAL_COMMUNITY): Payer: Self-pay | Admitting: Hematology

## 2017-09-17 ENCOUNTER — Inpatient Hospital Stay (HOSPITAL_BASED_OUTPATIENT_CLINIC_OR_DEPARTMENT_OTHER): Payer: Medicaid Other | Admitting: Hematology

## 2017-09-17 ENCOUNTER — Other Ambulatory Visit: Payer: Self-pay

## 2017-09-17 VITALS — BP 152/67 | HR 73 | Temp 98.4°F | Resp 18

## 2017-09-17 DIAGNOSIS — C50412 Malignant neoplasm of upper-outer quadrant of left female breast: Secondary | ICD-10-CM

## 2017-09-17 DIAGNOSIS — Z170 Estrogen receptor positive status [ER+]: Secondary | ICD-10-CM

## 2017-09-17 DIAGNOSIS — I10 Essential (primary) hypertension: Secondary | ICD-10-CM

## 2017-09-17 DIAGNOSIS — D649 Anemia, unspecified: Secondary | ICD-10-CM

## 2017-09-17 DIAGNOSIS — Z5111 Encounter for antineoplastic chemotherapy: Secondary | ICD-10-CM | POA: Diagnosis not present

## 2017-09-17 DIAGNOSIS — R309 Painful micturition, unspecified: Secondary | ICD-10-CM

## 2017-09-17 LAB — CBC WITH DIFFERENTIAL/PLATELET
BASOS ABS: 0 10*3/uL (ref 0.0–0.1)
Basophils Relative: 0 %
Eosinophils Absolute: 0 10*3/uL (ref 0.0–0.7)
Eosinophils Relative: 0 %
HEMATOCRIT: 28.6 % — AB (ref 36.0–46.0)
Hemoglobin: 9.6 g/dL — ABNORMAL LOW (ref 12.0–15.0)
LYMPHS ABS: 0.5 10*3/uL — AB (ref 0.7–4.0)
LYMPHS PCT: 14 %
MCH: 30.5 pg (ref 26.0–34.0)
MCHC: 33.6 g/dL (ref 30.0–36.0)
MCV: 90.8 fL (ref 78.0–100.0)
MONO ABS: 0.5 10*3/uL (ref 0.1–1.0)
Monocytes Relative: 15 %
Neutro Abs: 2.5 10*3/uL (ref 1.7–7.7)
Neutrophils Relative %: 71 %
Platelets: 94 10*3/uL — ABNORMAL LOW (ref 150–400)
RBC: 3.15 MIL/uL — ABNORMAL LOW (ref 3.87–5.11)
RDW: 14.2 % (ref 11.5–15.5)
WBC: 3.6 10*3/uL — ABNORMAL LOW (ref 4.0–10.5)

## 2017-09-17 LAB — COMPREHENSIVE METABOLIC PANEL
ALT: 14 U/L (ref 14–54)
AST: 14 U/L — AB (ref 15–41)
Albumin: 3.7 g/dL (ref 3.5–5.0)
Alkaline Phosphatase: 72 U/L (ref 38–126)
Anion gap: 8 (ref 5–15)
BUN: 10 mg/dL (ref 6–20)
CALCIUM: 8.8 mg/dL — AB (ref 8.9–10.3)
CO2: 25 mmol/L (ref 22–32)
CREATININE: 0.85 mg/dL (ref 0.44–1.00)
Chloride: 109 mmol/L (ref 101–111)
GFR calc Af Amer: >60 mL/min (ref >60)
Glucose, Bld: 139 mg/dL — ABNORMAL HIGH (ref 65–99)
Potassium: 3.8 mmol/L (ref 3.5–5.1)
Sodium: 142 mmol/L (ref 135–145)
Total Bilirubin: 0.5 mg/dL (ref 0.3–1.2)
Total Protein: 6.7 g/dL (ref 6.5–8.1)

## 2017-09-17 MED ORDER — SODIUM CHLORIDE 0.9 % IV SOLN
Freq: Once | INTRAVENOUS | Status: AC
  Administered 2017-09-17: 11:00:00 via INTRAVENOUS

## 2017-09-17 MED ORDER — DEXAMETHASONE SODIUM PHOSPHATE 100 MG/10ML IJ SOLN
20.0000 mg | Freq: Once | INTRAMUSCULAR | Status: AC
  Administered 2017-09-17: 20 mg via INTRAVENOUS
  Filled 2017-09-17: qty 2

## 2017-09-17 MED ORDER — HEPARIN SOD (PORK) LOCK FLUSH 100 UNIT/ML IV SOLN
500.0000 [IU] | Freq: Once | INTRAVENOUS | Status: AC | PRN
  Administered 2017-09-17: 500 [IU]
  Filled 2017-09-17: qty 5

## 2017-09-17 MED ORDER — SODIUM CHLORIDE 0.9% FLUSH
10.0000 mL | INTRAVENOUS | Status: DC | PRN
  Administered 2017-09-17: 10 mL
  Filled 2017-09-17: qty 10

## 2017-09-17 MED ORDER — FAMOTIDINE IN NACL 20-0.9 MG/50ML-% IV SOLN
20.0000 mg | Freq: Once | INTRAVENOUS | Status: AC
  Administered 2017-09-17: 20 mg via INTRAVENOUS
  Filled 2017-09-17: qty 50

## 2017-09-17 MED ORDER — DIPHENHYDRAMINE HCL 50 MG/ML IJ SOLN
50.0000 mg | Freq: Once | INTRAMUSCULAR | Status: AC
  Administered 2017-09-17: 50 mg via INTRAVENOUS
  Filled 2017-09-17: qty 1

## 2017-09-17 MED ORDER — SODIUM CHLORIDE 0.9 % IV SOLN
64.0000 mg/m2 | Freq: Once | INTRAVENOUS | Status: AC
  Administered 2017-09-17: 120 mg via INTRAVENOUS
  Filled 2017-09-17: qty 20

## 2017-09-17 NOTE — Assessment & Plan Note (Signed)
1.  Poorly differentiated left breast cancer stage IIIb, (cT3cN1), ER positive, PR and HER-2 negative: - On 06/12/2017, left breast biopsy with sarcomatoid features, left axillary lymph node biopsy negative.  2D echo shows EF of 55-60%. - CT of the chest showed subcarinal adenopathy, PET CT scan negative for metastatic disease. -4 cycles of dose dense Adriamycin and cyclophosphamide from 07/18/2017 through 09/03/2017. - She felt very bad last week.  Her hemoglobin was found to be severely low.  She had to receive 2 units of blood transfusion and 1 unit of platelet transfusion last week.  She complained of burning urination.  Today her white count and absolute neutrophil count improved.  However platelet count is at 94.  I will cut back on the dose of first weekly paclitaxel by 20%.  2.  Hypertension: She is taking metoprolol 50 mg twice daily.  We have discontinued Maxide because of elevated creatinine.  She is taking Norvasc 5 mg daily without any problems.  Blood pressure today is 436 systolic.  3.  We will send a sample for UA as she is complaining of burning on micturition since last Friday.  Denies any fevers or chills.

## 2017-09-17 NOTE — Progress Notes (Signed)
[address] [place] [address], [place] [address]   CLINIC:  Medical Oncology/Hematology  PCP:  Patient, No Pcp Per No address on file None   REASON FOR VISIT:  Follow-up for breast cancer.  CURRENT THERAPY: Weekly paclitaxel started on 09/17/2017.  BRIEF ONCOLOGIC HISTORY:    Breast cancer of upper-outer quadrant of left female breast ([place])   06/22/2017 Initial Diagnosis    Breast cancer of upper-outer quadrant of left female breast ([place])      06/22/2017 Cancer Staging    Staging form: Breast, AJCC 8th Edition - Clinical stage from 06/22/2017: Stage IIIB (cT3, cN1, cM0, G3, ER+, PR-, HER2-) - Signed by Derek Jack, MD on 06/22/2017      06/29/2017 Imaging    CT chest showing left axillary adenopathy, 1.2 cm anterior carinal adenopathy, left subpectoral lymph node subcentimeter  06/25/2017 2D echocardiogram with ejection fraction of 55-60%      07/16/2017 -  Chemotherapy    The patient had DOXOrubicin (ADRIAMYCIN) chemo injection 110 mg, 60 mg/m2 = 110 mg, Intravenous,  Once, 4 of 4 cycles Administration: 110 mg (07/18/2017), 110 mg (08/01/2017), 110 mg (08/20/2017), 110 mg (09/03/2017) palonosetron (ALOXI) injection 0.25 mg, 0.25 mg, Intravenous,  Once, 4 of 4 cycles Administration: 0.25 mg (07/18/2017), 0.25 mg (08/01/2017), 0.25 mg (08/20/2017), 0.25 mg (09/03/2017) pegfilgrastim-cbqv (UDENYCA) injection 6 mg, 6 mg, Subcutaneous, Once, 4 of 4 cycles Administration: 6 mg (07/19/2017), 6 mg (08/02/2017), 6 mg (08/22/2017), 6 mg (09/05/2017) cyclophosphamide (CYTOXAN) 1,100 mg in sodium chloride 0.9 % 250 mL chemo infusion, 600 mg/m2 = 1,100 mg, Intravenous,  Once, 4 of 4 cycles Administration: 1,100 mg (07/18/2017), 1,100 mg (08/01/2017), 1,100 mg (08/20/2017), 1,100 mg (09/03/2017) PACLitaxel (TAXOL) 120 mg in sodium chloride 0.9 % 250 mL chemo infusion (</= 11m/m2), 64 mg/m2 = 120 mg (80 % of original dose 80 mg/m2), Intravenous,  Once, 1 of 12 cycles Dose modification: 64  mg/m2 (80 % of original dose 80 mg/m2, Cycle 5, Reason: Provider Judgment) Administration: 120 mg (09/17/2017) fosaprepitant (EMEND) 150 mg, dexamethasone (DECADRON) 12 mg in sodium chloride 0.9 % 145 mL IVPB, , Intravenous,  Once, 4 of 4 cycles Administration:  (07/18/2017),  (08/01/2017),  (08/20/2017),  (09/03/2017)  for chemotherapy treatment.         CANCER STAGING: Cancer Staging Breast cancer of upper-outer quadrant of left female breast ([facility] Staging form: Breast, AJCC 8th Edition - Clinical stage from 06/22/2017: Stage IIIB (cT3, cN1, cM0, G3, ER+, PR-, HER2-) - Signed by KDerek Jack MD on 06/22/2017    INTERVAL HISTORY:  Ms. GVanlue89y.o. female returns for follow-up of chemotherapy for breast cancer.  She had a rough time after fourth cycle of dose dense AC.  She had to receive 2 units of blood transfusion and 1 unit of platelets last week.  Denied any active bleeding.  This week she felt better.  However she started having burning on urination which started Friday.  Denies any fevers or chills.  Appetite has been good.  Weight is stable.  Denies any symptoms of PND or orthopnea.  REVIEW OF SYSTEMS:  Review of Systems  Constitutional: Positive for fatigue.  Genitourinary: Positive for dysuria.   All other systems reviewed and are negative.    PAST MEDICAL/SURGICAL HISTORY:  Past Medical History:  Diagnosis Date  . Cancer ([facility]    left breast cancer  . Chest pain   . Hypertension    Past Surgical History:  Procedure Laterality Date  .  PORTACATH PLACEMENT Right 06/27/2017   Procedure: INSERTION PORT-A-CATH;  Surgeon: Aviva Signs, MD;  Location: [facility];  Service: General;  Laterality: Right;     SOCIAL HISTORY:  Social History   Socioeconomic History  . Marital status: Divorced    Spouse name: Not on file  . Number of children: Not on file  . Years of education: Not on file  . Highest education level: Not on file  Occupational History  . Not on file    Social Needs  . Financial resource strain: Somewhat hard  . Food insecurity:    Worry: Never true    Inability: Never true  . Transportation needs:    Medical: No    Non-medical: No  Tobacco Use  . Smoking status: Never Smoker  . Smokeless tobacco: Never Used  Substance and Sexual Activity  . Alcohol use: No  . Drug use: No  . Sexual activity: Not Currently  Lifestyle  . Physical activity:    Days per week: 3 days    Minutes per session: 10 min  . Stress: Very much  Relationships  . Social connections:    Talks on phone: More than three times a week    Gets together: More than three times a week    Attends religious service: More than 4 times per year    Active member of club or organization: Yes    Attends meetings of clubs or organizations: Never    Relationship status: Divorced  . Intimate partner violence:    Fear of current or ex partner: Patient refused    Emotionally abused: Patient refused    Physically abused: Patient refused    Forced sexual activity: Patient refused  Other Topics Concern  . Not on file  Social History Narrative  . Not on file    FAMILY HISTORY:  Family History  Problem Relation Age of Onset  . Breast cancer Mother   . Thyroid disease Mother   . Heart disease Mother   . Heart attack Father   . Heart attack Sister   . Hypertension Sister   . Heart attack Brother   . Cancer Sister   . Stroke Brother   . Alzheimer's disease Maternal Aunt     CURRENT MEDICATIONS:  Outpatient Encounter Medications as of 09/17/2017  Medication Sig  . amLODipine (NORVASC) 5 MG tablet Take 1 tablet (5 mg total) by mouth daily.  . ciprofloxacin (CIPRO) 500 MG tablet Take 1 tablet (500 mg total) by mouth 2 (two) times daily.  [place] HYDROcodone-acetaminophen (NORCO) 5-325 MG tablet Take 1 tablet by mouth every 6 (six) hours as needed for moderate pain.  . metoprolol tartrate (LOPRESSOR) 50 MG tablet Take 50 mg by mouth 2 (two) times daily.  . ondansetron  (ZOFRAN) 8 MG tablet Take 1 tablet (8 mg total) by mouth 2 (two) times daily as needed. Start on the third day after chemotherapy.  . pegfilgrastim-cbqv (UDENYCA) 6 MG/0.6ML injection Inject 6 mg into the skin once. Every 2 weeks x 4 cycles  . prochlorperazine (COMPAZINE) 10 MG tablet Take 1 tablet (10 mg total) by mouth every 6 (six) hours as needed (Nausea or vomiting).  . [DISCONTINUED] cyclophosphamide (CYTOXAN) 2 g chemo injection Inject into the vein once. Every 2 weeks x 4 cycles  . [DISCONTINUED] DOXOrubicin HCl (ADRIAMYCIN IV) Inject into the vein. Every 2 weeks x 4 cycles  . [DISCONTINUED] triamterene-hydrochlorothiazide (UTMLYYT-03) 37.5-25 MG tablet    No facility-administered encounter medications on file as of 09/17/2017.  ALLERGIES:  No Known Allergies   PHYSICAL EXAM:  ECOG Performance status: 1  Vitals:   09/17/17 1018  BP: (!) 142/65  Pulse: 84  Resp: 16  Temp: 99 F (37.2 C)  SpO2: 100%   Filed Weights   09/17/17 1018  Weight: 158 lb 8 oz (71.9 kg)    Physical Exam   LABORATORY DATA:  I have reviewed the labs as listed.  CBC    Component Value Date/Time   WBC 3.6 (L) 09/17/2017 0915   RBC 3.15 (L) 09/17/2017 0915   HGB 9.6 (L) 09/17/2017 0915   HCT 28.6 (L) 09/17/2017 0915   PLT 94 (L) 09/17/2017 0915   MCV 90.8 09/17/2017 0915   MCH 30.5 09/17/2017 0915   MCHC 33.6 09/17/2017 0915   RDW 14.2 09/17/2017 0915   LYMPHSABS 0.5 (L) 09/17/2017 0915   MONOABS 0.5 09/17/2017 0915   EOSABS 0.0 09/17/2017 0915   BASOSABS 0.0 09/17/2017 0915   CMP Latest Ref Rng & Units 09/17/2017 09/12/2017 09/03/2017  Glucose 65 - 99 mg/dL 139(H) 183(H) 129(H)  BUN 6 - 20 mg/dL '10 15 8  ' Creatinine 0.44 - 1.00 mg/dL 0.85 1.00 0.97  Sodium 135 - 145 mmol/L 142 135 141  Potassium 3.5 - 5.1 mmol/L 3.8 3.5 3.4(L)  Chloride 101 - 111 mmol/L 109 102 107  CO2 22 - 32 mmol/L '25 22 26  ' Calcium 8.9 - 10.3 mg/dL 8.8(L) 8.8(L) 9.1  Total Protein 6.5 - 8.1 g/dL 6.7 6.2(L) 6.9    Total Bilirubin 0.3 - 1.2 mg/dL 0.5 1.0 0.3  Alkaline Phos 38 - 126 U/L 72 56 56  AST 15 - 41 U/L 14(L) 15 17  ALT 14 - 54 U/L 14 12(L) 16         ASSESSMENT & PLAN:   Breast cancer of upper-outer quadrant of left female breast ([place]) 1.  Poorly differentiated left breast cancer stage IIIb, (cT3cN1), ER positive, PR and HER-2 negative: - On 06/12/2017, left breast biopsy with sarcomatoid features, left axillary lymph node biopsy negative.  2D echo shows EF of 55-60%. - CT of the chest showed subcarinal adenopathy, PET CT scan negative for metastatic disease. -4 cycles of dose dense Adriamycin and cyclophosphamide from 07/18/2017 through 09/03/2017. - She felt very bad last week.  Her hemoglobin was found to be severely low.  She had to receive 2 units of blood transfusion and 1 unit of platelet transfusion last week.  She complained of burning urination.  Today her white count and absolute neutrophil count improved.  However platelet count is at 94.  I will cut back on the dose of first weekly paclitaxel by 20%.  2.  Hypertension: She is taking metoprolol 50 mg twice daily.  We have discontinued Maxide because of elevated creatinine.  She is taking Norvasc 5 mg daily without any problems.  Blood pressure today is 354 systolic.  3.  We will send a sample for UA as she is complaining of burning on micturition since last Friday.  Denies any fevers or chills.       Orders placed this encounter:  No orders of the defined types were placed in this encounter.     Derek Jack, MD [facility] [phone]

## 2017-09-17 NOTE — Progress Notes (Signed)
Labs reviewed by Dr. Delton Coombes today and seen today for an office visit. Platelets 94, 000, will proceed with treatment today per Dr. Delton Coombes.   Treatment given per orders. Patient tolerated it well without problems. Vitals stable and discharged home from clinic ambulatory. Follow up as scheduled.

## 2017-09-17 NOTE — Patient Instructions (Signed)
[facility] Discharge Instructions for Patients Receiving Chemotherapy   Beginning January 23rd 2017 lab work for the Cancer Center will be done in the  Main lab at [facility] on 1st floor. If you have a lab appointment with the Cancer Center please come in thru the  Main Entrance and check in at the main information desk   Today you received the following chemotherapy agents   To help prevent nausea and vomiting after your treatment, we encourage you to take your nausea medication     If you develop nausea and vomiting, or diarrhea that is not controlled by your medication, call the clinic.  The clinic phone number is [phone]. Office hours are Monday-Friday 8:30am-5:00pm.  BELOW ARE SYMPTOMS THAT SHOULD BE REPORTED IMMEDIATELY:  *FEVER GREATER THAN 101.0 F  *CHILLS WITH OR WITHOUT FEVER  NAUSEA AND VOMITING THAT IS NOT CONTROLLED WITH YOUR NAUSEA MEDICATION  *UNUSUAL SHORTNESS OF BREATH  *UNUSUAL BRUISING OR BLEEDING  TENDERNESS IN MOUTH AND THROAT WITH OR WITHOUT PRESENCE OF ULCERS  *URINARY PROBLEMS  *BOWEL PROBLEMS  UNUSUAL RASH Items with * indicate a potential emergency and should be followed up as soon as possible. If you have an emergency after office hours please contact your primary care physician or go to the nearest emergency department.  Please call the clinic during office hours if you have any questions or concerns.   You may also contact the Patient Navigator at [phone] should you have any questions or need assistance in obtaining follow up care.      Resources For Cancer Patients and their Caregivers ? American Cancer Society: Can assist with transportation, wigs, general needs, runs Look Good Feel Better.        1-888-227-6333 ? Cancer Care: Provides financial assistance, online support groups, medication/co-pay assistance.  1-800-813-HOPE (4673) ? [facility] Assists [place] cancer  patients and their families through emotional , educational and financial support.  336-427-4357 ? [place] DSS Where to apply for food stamps, Medicaid and utility assistance. 336-342-1394 ? [employer]: Transportation to medical appointments. [phone] ? Social Security Administration: May apply for disability if have a Stage IV cancer. 336-342-7796 1-800-772-1213 ? [place] Aging, Disability and Transit Services: Assists with nutrition, care and transit needs. [phone]         

## 2017-09-20 ENCOUNTER — Other Ambulatory Visit (HOSPITAL_COMMUNITY): Payer: Self-pay

## 2017-09-20 DIAGNOSIS — D6481 Anemia due to antineoplastic chemotherapy: Secondary | ICD-10-CM

## 2017-09-20 DIAGNOSIS — C50412 Malignant neoplasm of upper-outer quadrant of left female breast: Secondary | ICD-10-CM

## 2017-09-20 DIAGNOSIS — Z170 Estrogen receptor positive status [ER+]: Principal | ICD-10-CM

## 2017-09-20 DIAGNOSIS — T451X5A Adverse effect of antineoplastic and immunosuppressive drugs, initial encounter: Secondary | ICD-10-CM

## 2017-09-24 ENCOUNTER — Encounter (HOSPITAL_COMMUNITY): Payer: Self-pay

## 2017-09-24 ENCOUNTER — Inpatient Hospital Stay (HOSPITAL_COMMUNITY): Payer: Medicaid Other

## 2017-09-24 VITALS — BP 130/67 | HR 83 | Temp 97.8°F | Resp 18 | Wt 156.6 lb

## 2017-09-24 DIAGNOSIS — C50412 Malignant neoplasm of upper-outer quadrant of left female breast: Secondary | ICD-10-CM

## 2017-09-24 DIAGNOSIS — T451X5A Adverse effect of antineoplastic and immunosuppressive drugs, initial encounter: Secondary | ICD-10-CM

## 2017-09-24 DIAGNOSIS — Z170 Estrogen receptor positive status [ER+]: Principal | ICD-10-CM

## 2017-09-24 DIAGNOSIS — D6481 Anemia due to antineoplastic chemotherapy: Secondary | ICD-10-CM

## 2017-09-24 DIAGNOSIS — Z5111 Encounter for antineoplastic chemotherapy: Secondary | ICD-10-CM | POA: Diagnosis not present

## 2017-09-24 LAB — CBC WITH DIFFERENTIAL/PLATELET
BASOS PCT: 1 %
Basophils Absolute: 0 10*3/uL (ref 0.0–0.1)
Eosinophils Absolute: 0 10*3/uL (ref 0.0–0.7)
Eosinophils Relative: 0 %
HCT: 28.3 % — ABNORMAL LOW (ref 36.0–46.0)
Hemoglobin: 9.5 g/dL — ABNORMAL LOW (ref 12.0–15.0)
Lymphocytes Relative: 21 %
Lymphs Abs: 0.7 10*3/uL (ref 0.7–4.0)
MCH: 30.8 pg (ref 26.0–34.0)
MCHC: 33.6 g/dL (ref 30.0–36.0)
MCV: 91.9 fL (ref 78.0–100.0)
MONOS PCT: 10 %
Monocytes Absolute: 0.3 10*3/uL (ref 0.1–1.0)
Neutro Abs: 2.4 10*3/uL (ref 1.7–7.7)
Neutrophils Relative %: 68 %
Platelets: 253 10*3/uL (ref 150–400)
RBC: 3.08 MIL/uL — ABNORMAL LOW (ref 3.87–5.11)
RDW: 15.3 % (ref 11.5–15.5)
WBC: 3.5 10*3/uL — ABNORMAL LOW (ref 4.0–10.5)

## 2017-09-24 LAB — COMPREHENSIVE METABOLIC PANEL
ALK PHOS: 57 U/L (ref 38–126)
ALT: 17 U/L (ref 14–54)
ANION GAP: 7 (ref 5–15)
AST: 19 U/L (ref 15–41)
Albumin: 3.6 g/dL (ref 3.5–5.0)
BUN: 12 mg/dL (ref 6–20)
CALCIUM: 8.7 mg/dL — AB (ref 8.9–10.3)
CHLORIDE: 110 mmol/L (ref 101–111)
CO2: 23 mmol/L (ref 22–32)
Creatinine, Ser: 0.78 mg/dL (ref 0.44–1.00)
GFR calc non Af Amer: >60 mL/min (ref >60)
Glucose, Bld: 169 mg/dL — ABNORMAL HIGH (ref 65–99)
Potassium: 3.6 mmol/L (ref 3.5–5.1)
SODIUM: 140 mmol/L (ref 135–145)
Total Bilirubin: 0.4 mg/dL (ref 0.3–1.2)
Total Protein: 6.6 g/dL (ref 6.5–8.1)

## 2017-09-24 MED ORDER — SODIUM CHLORIDE 0.9 % IV SOLN
20.0000 mg | Freq: Once | INTRAVENOUS | Status: AC
  Administered 2017-09-24: 20 mg via INTRAVENOUS
  Filled 2017-09-24: qty 2

## 2017-09-24 MED ORDER — SODIUM CHLORIDE 0.9 % IV SOLN
80.0000 mg/m2 | Freq: Once | INTRAVENOUS | Status: AC
  Administered 2017-09-24: 144 mg via INTRAVENOUS
  Filled 2017-09-24: qty 24

## 2017-09-24 MED ORDER — HEPARIN SOD (PORK) LOCK FLUSH 100 UNIT/ML IV SOLN
500.0000 [IU] | Freq: Once | INTRAVENOUS | Status: AC | PRN
  Administered 2017-09-24: 500 [IU]
  Filled 2017-09-24: qty 5

## 2017-09-24 MED ORDER — SODIUM CHLORIDE 0.9% FLUSH
10.0000 mL | INTRAVENOUS | Status: DC | PRN
  Administered 2017-09-24: 10 mL
  Filled 2017-09-24: qty 10

## 2017-09-24 MED ORDER — SODIUM CHLORIDE 0.9 % IV SOLN
Freq: Once | INTRAVENOUS | Status: AC
  Administered 2017-09-24: 11:00:00 via INTRAVENOUS

## 2017-09-24 MED ORDER — FAMOTIDINE IN NACL 20-0.9 MG/50ML-% IV SOLN
20.0000 mg | Freq: Once | INTRAVENOUS | Status: AC
  Administered 2017-09-24: 20 mg via INTRAVENOUS
  Filled 2017-09-24: qty 50

## 2017-09-24 MED ORDER — DIPHENHYDRAMINE HCL 50 MG/ML IJ SOLN
50.0000 mg | Freq: Once | INTRAMUSCULAR | Status: AC
  Administered 2017-09-24: 50 mg via INTRAVENOUS
  Filled 2017-09-24: qty 1

## 2017-09-24 NOTE — Progress Notes (Signed)
Lisa Thornton tolerated Taxol infusion well without complaints or incident.Labs reviewed prior to administering this medication VSS upon discharge. Pt discharged self ambulatory in satisfactory condition

## 2017-09-24 NOTE — Patient Instructions (Signed)
[facility] Discharge Instructions for Patients Receiving Chemotherapy   Beginning January 23rd 2017 lab work for the Cancer Center will be done in the  Main lab at [facility] on 1st floor. If you have a lab appointment with the Cancer Center please come in thru the  Main Entrance and check in at the main information desk   Today you received the following chemotherapy agents Taxol. Follow-up as scheduled. Call clinic for any questions or concerns  To help prevent nausea and vomiting after your treatment, we encourage you to take your nausea medication   If you develop nausea and vomiting, or diarrhea that is not controlled by your medication, call the clinic.  The clinic phone number is [phone]. Office hours are Monday-Friday 8:30am-5:00pm.  BELOW ARE SYMPTOMS THAT SHOULD BE REPORTED IMMEDIATELY:  *FEVER GREATER THAN 101.0 F  *CHILLS WITH OR WITHOUT FEVER  NAUSEA AND VOMITING THAT IS NOT CONTROLLED WITH YOUR NAUSEA MEDICATION  *UNUSUAL SHORTNESS OF BREATH  *UNUSUAL BRUISING OR BLEEDING  TENDERNESS IN MOUTH AND THROAT WITH OR WITHOUT PRESENCE OF ULCERS  *URINARY PROBLEMS  *BOWEL PROBLEMS  UNUSUAL RASH Items with * indicate a potential emergency and should be followed up as soon as possible. If you have an emergency after office hours please contact your primary care physician or go to the nearest emergency department.  Please call the clinic during office hours if you have any questions or concerns.   You may also contact the Patient Navigator at [phone] should you have any questions or need assistance in obtaining follow up care.      Resources For Cancer Patients and their Caregivers ? American Cancer Society: Can assist with transportation, wigs, general needs, runs Look Good Feel Better.        1-888-227-6333 ? Cancer Care: Provides financial assistance, online support groups, medication/co-pay assistance.  1-800-813-HOPE  (4673) ? [facility] Assists [place] cancer patients and their families through emotional , educational and financial support.  336-427-4357 ? [place] DSS Where to apply for food stamps, Medicaid and utility assistance. 336-342-1394 ? RC[employer]: Transportation to medical appointments. 336-347-2287 ? Social Security Administration: May apply for disability if have a Stage IV cancer. 336-342-7796 1-800-772-1213 ? [place] Aging, Disability and Transit Services: Assists with nutrition, care and transit needs. [phone]         

## 2017-10-01 ENCOUNTER — Other Ambulatory Visit (HOSPITAL_COMMUNITY): Payer: Self-pay

## 2017-10-01 ENCOUNTER — Other Ambulatory Visit (HOSPITAL_COMMUNITY): Payer: Medicaid Other

## 2017-10-01 ENCOUNTER — Ambulatory Visit (HOSPITAL_COMMUNITY): Payer: Medicaid Other

## 2017-10-01 DIAGNOSIS — T451X5A Adverse effect of antineoplastic and immunosuppressive drugs, initial encounter: Secondary | ICD-10-CM

## 2017-10-01 DIAGNOSIS — D6481 Anemia due to antineoplastic chemotherapy: Secondary | ICD-10-CM

## 2017-10-01 DIAGNOSIS — Z170 Estrogen receptor positive status [ER+]: Principal | ICD-10-CM

## 2017-10-01 DIAGNOSIS — C50412 Malignant neoplasm of upper-outer quadrant of left female breast: Secondary | ICD-10-CM

## 2017-10-03 ENCOUNTER — Inpatient Hospital Stay (HOSPITAL_BASED_OUTPATIENT_CLINIC_OR_DEPARTMENT_OTHER): Payer: Medicaid Other | Admitting: Hematology

## 2017-10-03 ENCOUNTER — Inpatient Hospital Stay (HOSPITAL_COMMUNITY): Payer: Medicaid Other | Attending: Hematology

## 2017-10-03 ENCOUNTER — Encounter (HOSPITAL_COMMUNITY): Payer: Self-pay | Admitting: Hematology

## 2017-10-03 ENCOUNTER — Inpatient Hospital Stay (HOSPITAL_COMMUNITY): Payer: Medicaid Other

## 2017-10-03 VITALS — BP 167/82 | HR 75 | Temp 97.6°F | Resp 14

## 2017-10-03 VITALS — BP 154/72 | HR 84 | Temp 97.8°F | Resp 14 | Wt 159.4 lb

## 2017-10-03 DIAGNOSIS — D6481 Anemia due to antineoplastic chemotherapy: Secondary | ICD-10-CM

## 2017-10-03 DIAGNOSIS — Z170 Estrogen receptor positive status [ER+]: Secondary | ICD-10-CM

## 2017-10-03 DIAGNOSIS — C50412 Malignant neoplasm of upper-outer quadrant of left female breast: Secondary | ICD-10-CM

## 2017-10-03 DIAGNOSIS — Z5111 Encounter for antineoplastic chemotherapy: Secondary | ICD-10-CM | POA: Diagnosis present

## 2017-10-03 DIAGNOSIS — I10 Essential (primary) hypertension: Secondary | ICD-10-CM | POA: Insufficient documentation

## 2017-10-03 DIAGNOSIS — T451X5A Adverse effect of antineoplastic and immunosuppressive drugs, initial encounter: Secondary | ICD-10-CM

## 2017-10-03 LAB — CBC WITH DIFFERENTIAL/PLATELET
BASOS PCT: 0 %
Basophils Absolute: 0 10*3/uL (ref 0.0–0.1)
EOS ABS: 0 10*3/uL (ref 0.0–0.7)
Eosinophils Relative: 1 %
HEMATOCRIT: 27.7 % — AB (ref 36.0–46.0)
HEMOGLOBIN: 9.4 g/dL — AB (ref 12.0–15.0)
Lymphocytes Relative: 23 %
Lymphs Abs: 0.9 10*3/uL (ref 0.7–4.0)
MCH: 31.5 pg (ref 26.0–34.0)
MCHC: 33.9 g/dL (ref 30.0–36.0)
MCV: 93 fL (ref 78.0–100.0)
MONO ABS: 0.6 10*3/uL (ref 0.1–1.0)
MONOS PCT: 16 %
NEUTROS PCT: 60 %
Neutro Abs: 2.4 10*3/uL (ref 1.7–7.7)
Platelets: 263 10*3/uL (ref 150–400)
RBC: 2.98 MIL/uL — ABNORMAL LOW (ref 3.87–5.11)
RDW: 18.3 % — ABNORMAL HIGH (ref 11.5–15.5)
WBC: 4 10*3/uL (ref 4.0–10.5)

## 2017-10-03 LAB — COMPREHENSIVE METABOLIC PANEL
ALT: 16 U/L (ref 0–44)
ANION GAP: 9 (ref 5–15)
AST: 19 U/L (ref 15–41)
Albumin: 3.7 g/dL (ref 3.5–5.0)
Alkaline Phosphatase: 58 U/L (ref 38–126)
BUN: 9 mg/dL (ref 8–23)
CHLORIDE: 109 mmol/L (ref 98–111)
CO2: 26 mmol/L (ref 22–32)
Calcium: 9.2 mg/dL (ref 8.9–10.3)
Creatinine, Ser: 0.79 mg/dL (ref 0.44–1.00)
GFR calc Af Amer: >60 mL/min (ref >60)
Glucose, Bld: 143 mg/dL — ABNORMAL HIGH (ref 70–99)
POTASSIUM: 3.6 mmol/L (ref 3.5–5.1)
Sodium: 144 mmol/L (ref 135–145)
Total Bilirubin: 0.4 mg/dL (ref 0.3–1.2)
Total Protein: 6.7 g/dL (ref 6.5–8.1)

## 2017-10-03 MED ORDER — SODIUM CHLORIDE 0.9 % IV SOLN
20.0000 mg | Freq: Once | INTRAVENOUS | Status: AC
  Administered 2017-10-03: 20 mg via INTRAVENOUS
  Filled 2017-10-03: qty 2

## 2017-10-03 MED ORDER — SODIUM CHLORIDE 0.9 % IV SOLN
Freq: Once | INTRAVENOUS | Status: AC
  Administered 2017-10-03: 10:00:00 via INTRAVENOUS

## 2017-10-03 MED ORDER — HEPARIN SOD (PORK) LOCK FLUSH 100 UNIT/ML IV SOLN
500.0000 [IU] | Freq: Once | INTRAVENOUS | Status: AC | PRN
  Administered 2017-10-03: 500 [IU]

## 2017-10-03 MED ORDER — DIPHENHYDRAMINE HCL 50 MG/ML IJ SOLN
50.0000 mg | Freq: Once | INTRAMUSCULAR | Status: AC
  Administered 2017-10-03: 50 mg via INTRAVENOUS
  Filled 2017-10-03: qty 1

## 2017-10-03 MED ORDER — SODIUM CHLORIDE 0.9 % IV SOLN
80.0000 mg/m2 | Freq: Once | INTRAVENOUS | Status: AC
  Administered 2017-10-03: 144 mg via INTRAVENOUS
  Filled 2017-10-03: qty 24

## 2017-10-03 MED ORDER — SODIUM CHLORIDE 0.9% FLUSH
10.0000 mL | INTRAVENOUS | Status: DC | PRN
  Administered 2017-10-03: 10 mL
  Filled 2017-10-03: qty 10

## 2017-10-03 MED ORDER — FAMOTIDINE IN NACL 20-0.9 MG/50ML-% IV SOLN
20.0000 mg | Freq: Once | INTRAVENOUS | Status: AC
  Administered 2017-10-03: 20 mg via INTRAVENOUS
  Filled 2017-10-03: qty 50

## 2017-10-03 NOTE — Patient Instructions (Signed)
[place] at [facility]  Discharge Instructions:  Today you received your taxol infusion. _______________________________________________________________  Thank you for choosing [place] at [facility] to provide your oncology and hematology care.  To afford each patient quality time with our providers, please arrive at least 15 minutes before your scheduled appointment.  You need to re-schedule your appointment if you arrive 10 or more minutes late.  We strive to give you quality time with our providers, and arriving late affects you and other patients whose appointments are after yours.  Also, if you no show three or more times for appointments you may be dismissed from the clinic.  Again, thank you for choosing [place] at [place] hope is that these requests will allow you access to exceptional care and in a timely manner. _______________________________________________________________  If you have questions after your visit, please contact our office at [phone] between the hours of 8:30 a.m. and 5:00 p.m. Voicemails left after 4:30 p.m. will not be returned until the following business day. _______________________________________________________________  For prescription refill requests, have your pharmacy contact our office. _______________________________________________________________  Recommendations made by the consultant and any test results will be sent to your referring physician. _______________________________________________________________

## 2017-10-03 NOTE — Patient Instructions (Signed)
[facility] at [facility] Discharge Instructions  You saw Dr. Katragadda today.   Thank you for choosing [facility] at [facility] to provide your oncology and hematology care.  To afford each patient quality time with our provider, please arrive at least 15 minutes before your scheduled appointment time.   If you have a lab appointment with the Cancer Center please come in thru the  Main Entrance and check in at the main information desk  You need to re-schedule your appointment should you arrive 10 or more minutes late.  We strive to give you quality time with our providers, and arriving late affects you and other patients whose appointments are after yours.  Also, if you no show three or more times for appointments you may be dismissed from the clinic at the providers discretion.     Again, thank you for choosing [facility].  Our hope is that these requests will decrease the amount of time that you wait before being seen by our physicians.       _____________________________________________________________  Should you have questions after your visit to  Cancer Center, please contact our office at [phone] between the hours of 8:30 a.m. and 4:30 p.m.  Voicemails left after 4:30 p.m. will not be returned until the following business day.  For prescription refill requests, have your pharmacy contact our office.       Resources For Cancer Patients and their Caregivers ? American Cancer Society: Can assist with transportation, wigs, general needs, runs Look Good Feel Better.        1-888-227-6333 ? Cancer Care: Provides financial assistance, online support groups, medication/co-pay assistance.  1-800-813-HOPE (4673) ? [facility] Assists [place] cancer patients and their families through emotional , educational and financial support.  336-427-4357 ? [place] DSS Where to apply for  food stamps, Medicaid and utility assistance. 336-342-1394 ? [employer]: Transportation to medical appointments. 336-347-2287 ? Social Security Administration: May apply for disability if have a Stage IV cancer. 336-342-7796 1-800-772-1213 ? [place] Aging, Disability and Transit Services: Assists with nutrition, care and transit needs. 336-349-2343  Cancer Center Support Programs:   > Cancer Support Group  2nd Tuesday of the month 1pm-2pm, Journey Room   > Creative Journey  3rd Tuesday of the month 1130am-1pm, Journey Room     

## 2017-10-03 NOTE — Progress Notes (Signed)
Lisa Thornton tolerated Taxol infusion well without complaints or incident. Labs reviewed prior to administration. VSS, discharged self ambulatory in satisfactory condition.

## 2017-10-03 NOTE — Progress Notes (Signed)
[place], [place] [address]   CLINIC:  Medical Oncology/Hematology  PCP:  Patient, No Pcp Per No address on file None   REASON FOR VISIT:  Follow-up for breast cancer  CURRENT THERAPY: Weekly paclitaxel started 09/17/2017  BRIEF ONCOLOGIC HISTORY:    Breast cancer of upper-outer quadrant of left female breast ([place])   06/22/2017 Initial Diagnosis    Breast cancer of upper-outer quadrant of left female breast ([place])      06/22/2017 Cancer Staging    Staging form: Breast, AJCC 8th Edition - Clinical stage from 06/22/2017: Stage IIIB (cT3, cN1, cM0, G3, ER+, PR-, HER2-) - Signed by Derek Jack, MD on 06/22/2017      06/29/2017 Imaging    CT chest showing left axillary adenopathy, 1.2 cm anterior carinal adenopathy, left subpectoral lymph node subcentimeter  06/25/2017 2D echocardiogram with ejection fraction of 55-60%      07/16/2017 -  Chemotherapy    The patient had DOXOrubicin (ADRIAMYCIN) chemo injection 110 mg, 60 mg/m2 = 110 mg, Intravenous,  Once, 4 of 4 cycles Administration: 110 mg (07/18/2017), 110 mg (08/01/2017), 110 mg (08/20/2017), 110 mg (09/03/2017) palonosetron (ALOXI) injection 0.25 mg, 0.25 mg, Intravenous,  Once, 4 of 4 cycles Administration: 0.25 mg (07/18/2017), 0.25 mg (08/01/2017), 0.25 mg (08/20/2017), 0.25 mg (09/03/2017) pegfilgrastim-cbqv (UDENYCA) injection 6 mg, 6 mg, Subcutaneous, Once, 4 of 4 cycles Administration: 6 mg (07/19/2017), 6 mg (08/02/2017), 6 mg (08/22/2017), 6 mg (09/05/2017) cyclophosphamide (CYTOXAN) 1,100 mg in sodium chloride 0.9 % 250 mL chemo infusion, 600 mg/m2 = 1,100 mg, Intravenous,  Once, 4 of 4 cycles Administration: 1,100 mg (07/18/2017), 1,100 mg (08/01/2017), 1,100 mg (08/20/2017), 1,100 mg (09/03/2017) PACLitaxel (TAXOL) 120 mg in sodium chloride 0.9 % 250 mL chemo infusion (</= 18m/m2), 64 mg/m2 = 120 mg (80 % of original dose 80 mg/m2), Intravenous,  Once, 2 of 12 cycles Dose modification: 64  mg/m2 (80 % of original dose 80 mg/m2, Cycle 5, Reason: Provider Judgment) Administration: 120 mg (09/17/2017), 144 mg (09/24/2017) fosaprepitant (EMEND) 150 mg, dexamethasone (DECADRON) 12 mg in sodium chloride 0.9 % 145 mL IVPB, , Intravenous,  Once, 4 of 4 cycles Administration:  (07/18/2017),  (08/01/2017),  (08/20/2017),  (09/03/2017)  for chemotherapy treatment.         CANCER STAGING: Cancer Staging Breast cancer of upper-outer quadrant of left female breast ([facility] Staging form: Breast, AJCC 8th Edition - Clinical stage from 06/22/2017: Stage IIIB (cT3, cN1, cM0, G3, ER+, PR-, HER2-) - Signed by KDerek Jack MD on 06/22/2017    INTERVAL HISTORY:  Ms. GGayden59y.o. female returns for routine follow-up for breast cancer and her 3rd cycle of chemotherapy. Overall, she tells me she has been feeling pretty well. Energy levels 75%; appetite 75%. Patient denies having any mouth sores. Denies nausea,vomiting, or diarrhea. She feels ready for next cycle of chemo today.  Denies any tingling or numbness in the extremities.  Appetite has been great.  She is eating well.    REVIEW OF SYSTEMS:  Review of Systems  All other systems reviewed and are negative.    PAST MEDICAL/SURGICAL HISTORY:  Past Medical History:  Diagnosis Date  . Cancer ([facility]    left breast cancer  . Chest pain   . Hypertension    Past Surgical History:  Procedure Laterality Date  . PORTACATH PLACEMENT Right 06/27/2017   Procedure: INSERTION PORT-A-CATH;  Surgeon: JAviva Signs MD;  Location: [facility];  Service: General;  Laterality: Right;  SOCIAL HISTORY:  Social History   Socioeconomic History  . Marital status: Divorced    Spouse name: Not on file  . Number of children: Not on file  . Years of education: Not on file  . Highest education level: Not on file  Occupational History  . Not on file  Social Needs  . Financial resource strain: Somewhat hard  . Food insecurity:    Worry: Never true     Inability: Never true  . Transportation needs:    Medical: No    Non-medical: No  Tobacco Use  . Smoking status: Never Smoker  . Smokeless tobacco: Never Used  Substance and Sexual Activity  . Alcohol use: No  . Drug use: No  . Sexual activity: Not Currently  Lifestyle  . Physical activity:    Days per week: 3 days    Minutes per session: 10 min  . Stress: Very much  Relationships  . Social connections:    Talks on phone: More than three times a week    Gets together: More than three times a week    Attends religious service: More than 4 times per year    Active member of club or organization: Yes    Attends meetings of clubs or organizations: Never    Relationship status: Divorced  . Intimate partner violence:    Fear of current or ex partner: Patient refused    Emotionally abused: Patient refused    Physically abused: Patient refused    Forced sexual activity: Patient refused  Other Topics Concern  . Not on file  Social History Narrative  . Not on file    FAMILY HISTORY:  Family History  Problem Relation Age of Onset  . Breast cancer Mother   . Thyroid disease Mother   . Heart disease Mother   . Heart attack Father   . Heart attack Sister   . Hypertension Sister   . Heart attack Brother   . Cancer Sister   . Stroke Brother   . Alzheimer's disease Maternal Aunt     CURRENT MEDICATIONS:  Outpatient Encounter Medications as of 10/03/2017  Medication Sig  . amLODipine (NORVASC) 5 MG tablet Take 1 tablet (5 mg total) by mouth daily.  . ciprofloxacin (CIPRO) 500 MG tablet Take 1 tablet (500 mg total) by mouth 2 (two) times daily.  Marland Kitchen HYDROcodone-acetaminophen (NORCO) 5-325 MG tablet Take 1 tablet by mouth every 6 (six) hours as needed for moderate pain.  . metoprolol tartrate (LOPRESSOR) 50 MG tablet Take 50 mg by mouth 2 (two) times daily.  . ondansetron (ZOFRAN) 8 MG tablet Take 1 tablet (8 mg total) by mouth 2 (two) times daily as needed. Start on the third day  after chemotherapy.  . pegfilgrastim-cbqv (UDENYCA) 6 MG/0.6ML injection Inject 6 mg into the skin once. Every 2 weeks x 4 cycles  . prochlorperazine (COMPAZINE) 10 MG tablet Take 1 tablet (10 mg total) by mouth every 6 (six) hours as needed (Nausea or vomiting).   No facility-administered encounter medications on file as of 10/03/2017.     ALLERGIES:  No Known Allergies   PHYSICAL EXAM:  ECOG Performance status: 1  VITAL SIGNS: BP: 154/72 Pules: 84, RESP: 14, TEMP: 97.8, 02SAT: 100% Physical Exam   LABORATORY DATA:  I have reviewed the labs as listed.  CBC    Component Value Date/Time   WBC 4.0 10/03/2017 0912   RBC 2.98 (L) 10/03/2017 0912   HGB 9.4 (L) 10/03/2017 7564  HCT 27.7 (L) 10/03/2017 0912   PLT 263 10/03/2017 0912   MCV 93.0 10/03/2017 0912   MCH 31.5 10/03/2017 0912   MCHC 33.9 10/03/2017 0912   RDW 18.3 (H) 10/03/2017 0912   LYMPHSABS 0.9 10/03/2017 0912   MONOABS 0.6 10/03/2017 0912   EOSABS 0.0 10/03/2017 0912   BASOSABS 0.0 10/03/2017 0912   CMP Latest Ref Rng & Units 10/03/2017 09/24/2017 09/17/2017  Glucose 70 - 99 mg/dL 143(H) 169(H) 139(H)  BUN 8 - 23 mg/dL _0 Creatinine 0.44 - 1.00 mg/dL 0.79 0.78 0.85  Sodium 135 - 145 mmol/L 144 140 142  Potassium 3.5 - 5.1 mmol/L 3.6 3.6 3.8  Chloride 98 - 111 mmol/L 109 110 109  CO2 22 - 32 mmol/L _1 Calcium 8.9 - 10.3 mg/dL 9.2 8.7(L) 8.8(L)  Total Protein 6.5 - 8.1 g/dL 6.7 6.6 6.7  Total Bilirubin 0.3 - 1.2 mg/dL 0.4 0.4 0.5  Alkaline Phos 38 - 126 U/L 58 57 72  AST 15 - 41 U/L 19 19 14(L)  ALT 0 - 44 U/L _2 ASSESSMENT & PLAN:   Breast cancer of upper-outer quadrant of left female breast ([place]) 1.  Poorly differentiated left breast cancer stage IIIb, (cT3cN1), ER positive, PR and HER-2 negative: - On 06/12/2017, left breast biopsy with sarcomatoid features, left axillary lymph node biopsy negative.  2D echo shows EF of 55-60%. - CT of the chest showed subcarinal  adenopathy, PET CT scan negative for metastatic disease. -4 cycles of dose dense Adriamycin and cyclophosphamide from 07/18/2017 through 09/03/2017. - Weekly paclitaxel started on 09/17/2017.  She is here for week 3 of Taxol.  She is tolerating it very well.  Today, blood counts are adequate to proceed with week 3 without any dose modifications.  I will see her back every other week to assess for toxicities.  2.  Hypertension: She is taking metoprolol 50 mg twice daily.  We have discontinued Maxide because of elevated creatinine.  She is taking Norvasc 5 mg daily without any problems.  Blood pressure today is 685 systolic.        Orders placed this encounter:  Orders Placed This Encounter  Procedures  . CBC with Differential/Platelet  . Comprehensive metabolic panel      Derek Jack, MD [place] [phone]

## 2017-10-03 NOTE — Assessment & Plan Note (Signed)
1.  Poorly differentiated left breast cancer stage IIIb, (cT3cN1), ER positive, PR and HER-2 negative: - On 06/12/2017, left breast biopsy with sarcomatoid features, left axillary lymph node biopsy negative.  2D echo shows EF of 55-60%. - CT of the chest showed subcarinal adenopathy, PET CT scan negative for metastatic disease. -4 cycles of dose dense Adriamycin and cyclophosphamide from 07/18/2017 through 09/03/2017. - Weekly paclitaxel started on 09/17/2017.  She is here for week 3 of Taxol.  She is tolerating it very well.  Today, blood counts are adequate to proceed with week 3 without any dose modifications.  I will see her back every other week to assess for toxicities.  2.  Hypertension: She is taking metoprolol 50 mg twice daily.  We have discontinued Maxide because of elevated creatinine.  She is taking Norvasc 5 mg daily without any problems.  Blood pressure today is 427 systolic.

## 2017-10-11 ENCOUNTER — Encounter (HOSPITAL_COMMUNITY): Payer: Self-pay

## 2017-10-11 ENCOUNTER — Inpatient Hospital Stay (HOSPITAL_COMMUNITY): Payer: Medicaid Other

## 2017-10-11 ENCOUNTER — Other Ambulatory Visit: Payer: Self-pay

## 2017-10-11 VITALS — BP 124/64 | HR 70 | Temp 98.2°F | Resp 18 | Wt 156.5 lb

## 2017-10-11 DIAGNOSIS — Z5111 Encounter for antineoplastic chemotherapy: Secondary | ICD-10-CM | POA: Diagnosis not present

## 2017-10-11 DIAGNOSIS — C50412 Malignant neoplasm of upper-outer quadrant of left female breast: Secondary | ICD-10-CM

## 2017-10-11 DIAGNOSIS — T451X5A Adverse effect of antineoplastic and immunosuppressive drugs, initial encounter: Secondary | ICD-10-CM

## 2017-10-11 DIAGNOSIS — D6481 Anemia due to antineoplastic chemotherapy: Secondary | ICD-10-CM

## 2017-10-11 DIAGNOSIS — Z170 Estrogen receptor positive status [ER+]: Principal | ICD-10-CM

## 2017-10-11 LAB — COMPREHENSIVE METABOLIC PANEL
ALBUMIN: 3.7 g/dL (ref 3.5–5.0)
ALT: 18 U/L (ref 0–44)
ANION GAP: 8 (ref 5–15)
AST: 22 U/L (ref 15–41)
Alkaline Phosphatase: 54 U/L (ref 38–126)
BILIRUBIN TOTAL: 0.5 mg/dL (ref 0.3–1.2)
BUN: 16 mg/dL (ref 8–23)
CHLORIDE: 106 mmol/L (ref 98–111)
CO2: 24 mmol/L (ref 22–32)
Calcium: 8.9 mg/dL (ref 8.9–10.3)
Creatinine, Ser: 0.9 mg/dL (ref 0.44–1.00)
GFR calc Af Amer: >60 mL/min (ref >60)
GFR calc non Af Amer: >60 mL/min (ref >60)
GLUCOSE: 157 mg/dL — AB (ref 70–99)
POTASSIUM: 3.8 mmol/L (ref 3.5–5.1)
Sodium: 138 mmol/L (ref 135–145)
Total Protein: 6.9 g/dL (ref 6.5–8.1)

## 2017-10-11 LAB — CBC WITH DIFFERENTIAL/PLATELET
BASOS ABS: 0 10*3/uL (ref 0.0–0.1)
Basophils Relative: 0 %
Eosinophils Absolute: 0.1 10*3/uL (ref 0.0–0.7)
Eosinophils Relative: 1 %
HEMATOCRIT: 27.7 % — AB (ref 36.0–46.0)
HEMOGLOBIN: 9.2 g/dL — AB (ref 12.0–15.0)
LYMPHS PCT: 18 %
Lymphs Abs: 1 10*3/uL (ref 0.7–4.0)
MCH: 31.8 pg (ref 26.0–34.0)
MCHC: 33.2 g/dL (ref 30.0–36.0)
MCV: 95.8 fL (ref 78.0–100.0)
MONO ABS: 0.4 10*3/uL (ref 0.1–1.0)
MONOS PCT: 8 %
NEUTROS ABS: 4.1 10*3/uL (ref 1.7–7.7)
NEUTROS PCT: 73 %
Platelets: 252 10*3/uL (ref 150–400)
RBC: 2.89 MIL/uL — ABNORMAL LOW (ref 3.87–5.11)
RDW: 18.5 % — AB (ref 11.5–15.5)
WBC: 5.6 10*3/uL (ref 4.0–10.5)

## 2017-10-11 MED ORDER — PACLITAXEL CHEMO INJECTION 300 MG/50ML
80.0000 mg/m2 | Freq: Once | INTRAVENOUS | Status: AC
  Administered 2017-10-11: 144 mg via INTRAVENOUS
  Filled 2017-10-11: qty 24

## 2017-10-11 MED ORDER — FAMOTIDINE IN NACL 20-0.9 MG/50ML-% IV SOLN
20.0000 mg | Freq: Once | INTRAVENOUS | Status: AC
  Administered 2017-10-11: 20 mg via INTRAVENOUS
  Filled 2017-10-11: qty 50

## 2017-10-11 MED ORDER — HEPARIN SOD (PORK) LOCK FLUSH 100 UNIT/ML IV SOLN
500.0000 [IU] | Freq: Once | INTRAVENOUS | Status: AC | PRN
  Administered 2017-10-11: 500 [IU]
  Filled 2017-10-11 (×2): qty 5

## 2017-10-11 MED ORDER — DEXAMETHASONE SODIUM PHOSPHATE 100 MG/10ML IJ SOLN
20.0000 mg | Freq: Once | INTRAMUSCULAR | Status: AC
  Administered 2017-10-11: 20 mg via INTRAVENOUS
  Filled 2017-10-11: qty 2

## 2017-10-11 MED ORDER — SODIUM CHLORIDE 0.9 % IV SOLN
Freq: Once | INTRAVENOUS | Status: AC
  Administered 2017-10-11: 10:00:00 via INTRAVENOUS

## 2017-10-11 MED ORDER — DIPHENHYDRAMINE HCL 50 MG/ML IJ SOLN
50.0000 mg | Freq: Once | INTRAMUSCULAR | Status: AC
  Administered 2017-10-11: 50 mg via INTRAVENOUS
  Filled 2017-10-11: qty 1

## 2017-10-11 NOTE — Progress Notes (Signed)
Tolerated infusion w/o adverse reaction.  Alert, in no distress.  VSS.  Discharged ambulatory in c/o family.  

## 2017-10-18 ENCOUNTER — Inpatient Hospital Stay (HOSPITAL_COMMUNITY): Payer: Medicaid Other

## 2017-10-18 ENCOUNTER — Other Ambulatory Visit: Payer: Self-pay

## 2017-10-18 ENCOUNTER — Ambulatory Visit (HOSPITAL_COMMUNITY): Payer: Medicaid Other

## 2017-10-18 ENCOUNTER — Encounter (HOSPITAL_COMMUNITY): Payer: Self-pay

## 2017-10-18 ENCOUNTER — Other Ambulatory Visit (HOSPITAL_COMMUNITY): Payer: Medicaid Other

## 2017-10-18 VITALS — BP 145/59 | HR 78 | Temp 98.4°F | Resp 20 | Wt 157.0 lb

## 2017-10-18 DIAGNOSIS — C50412 Malignant neoplasm of upper-outer quadrant of left female breast: Secondary | ICD-10-CM

## 2017-10-18 DIAGNOSIS — Z170 Estrogen receptor positive status [ER+]: Principal | ICD-10-CM

## 2017-10-18 DIAGNOSIS — R350 Frequency of micturition: Secondary | ICD-10-CM

## 2017-10-18 DIAGNOSIS — Z5111 Encounter for antineoplastic chemotherapy: Secondary | ICD-10-CM | POA: Diagnosis not present

## 2017-10-18 LAB — CBC WITH DIFFERENTIAL/PLATELET
BASOS ABS: 0 10*3/uL (ref 0.0–0.1)
BASOS PCT: 0 %
EOS PCT: 4 %
Eosinophils Absolute: 0.2 10*3/uL (ref 0.0–0.7)
HCT: 25.9 % — ABNORMAL LOW (ref 36.0–46.0)
HEMOGLOBIN: 8.9 g/dL — AB (ref 12.0–15.0)
Lymphocytes Relative: 15 %
Lymphs Abs: 0.7 10*3/uL (ref 0.7–4.0)
MCH: 33 pg (ref 26.0–34.0)
MCHC: 34.4 g/dL (ref 30.0–36.0)
MCV: 95.9 fL (ref 78.0–100.0)
Monocytes Absolute: 0.4 10*3/uL (ref 0.1–1.0)
Monocytes Relative: 8 %
NEUTROS PCT: 73 %
Neutro Abs: 3.4 10*3/uL (ref 1.7–7.7)
PLATELETS: 249 10*3/uL (ref 150–400)
RBC: 2.7 MIL/uL — AB (ref 3.87–5.11)
RDW: 18.7 % — ABNORMAL HIGH (ref 11.5–15.5)
WBC: 4.7 10*3/uL (ref 4.0–10.5)

## 2017-10-18 LAB — URINALYSIS, ROUTINE W REFLEX MICROSCOPIC
BILIRUBIN URINE: NEGATIVE
GLUCOSE, UA: NEGATIVE mg/dL
KETONES UR: NEGATIVE mg/dL
NITRITE: NEGATIVE
PROTEIN: 100 mg/dL — AB
RBC / HPF: >50 RBC/hpf — ABNORMAL HIGH (ref 0–5)
Specific Gravity, Urine: 1.019 (ref 1.005–1.030)
pH: 5 (ref 5.0–8.0)

## 2017-10-18 LAB — COMPREHENSIVE METABOLIC PANEL
ALBUMIN: 3.7 g/dL (ref 3.5–5.0)
ALK PHOS: 46 U/L (ref 38–126)
ALT: 18 U/L (ref 0–44)
AST: 18 U/L (ref 15–41)
Anion gap: 7 (ref 5–15)
BUN: 10 mg/dL (ref 8–23)
CHLORIDE: 110 mmol/L (ref 98–111)
CO2: 23 mmol/L (ref 22–32)
CREATININE: 0.86 mg/dL (ref 0.44–1.00)
Calcium: 9 mg/dL (ref 8.9–10.3)
GFR calc Af Amer: >60 mL/min (ref >60)
GFR calc non Af Amer: >60 mL/min (ref >60)
GLUCOSE: 128 mg/dL — AB (ref 70–99)
Potassium: 3.6 mmol/L (ref 3.5–5.1)
SODIUM: 140 mmol/L (ref 135–145)
Total Bilirubin: 0.5 mg/dL (ref 0.3–1.2)
Total Protein: 6.7 g/dL (ref 6.5–8.1)

## 2017-10-18 MED ORDER — DIPHENHYDRAMINE HCL 50 MG/ML IJ SOLN
50.0000 mg | Freq: Once | INTRAMUSCULAR | Status: AC
  Administered 2017-10-18: 50 mg via INTRAVENOUS
  Filled 2017-10-18: qty 1

## 2017-10-18 MED ORDER — HEPARIN SOD (PORK) LOCK FLUSH 100 UNIT/ML IV SOLN
INTRAVENOUS | Status: AC
  Filled 2017-10-18: qty 5

## 2017-10-18 MED ORDER — HEPARIN SOD (PORK) LOCK FLUSH 100 UNIT/ML IV SOLN
500.0000 [IU] | Freq: Once | INTRAVENOUS | Status: AC | PRN
  Administered 2017-10-18: 500 [IU]
  Filled 2017-10-18: qty 5

## 2017-10-18 MED ORDER — FAMOTIDINE IN NACL 20-0.9 MG/50ML-% IV SOLN
20.0000 mg | Freq: Once | INTRAVENOUS | Status: AC
  Administered 2017-10-18: 20 mg via INTRAVENOUS
  Filled 2017-10-18: qty 50

## 2017-10-18 MED ORDER — SULFAMETHOXAZOLE-TRIMETHOPRIM 800-160 MG PO TABS: 1.0000 | ORAL_TABLET | Freq: Two times a day (BID) | ORAL | 0 refills | Status: AC

## 2017-10-18 MED ORDER — SODIUM CHLORIDE 0.9 % IV SOLN
80.0000 mg/m2 | Freq: Once | INTRAVENOUS | Status: AC
  Administered 2017-10-18: 144 mg via INTRAVENOUS
  Filled 2017-10-18: qty 24

## 2017-10-18 MED ORDER — DEXAMETHASONE SODIUM PHOSPHATE 100 MG/10ML IJ SOLN
20.0000 mg | Freq: Once | INTRAMUSCULAR | Status: AC
  Administered 2017-10-18: 20 mg via INTRAVENOUS
  Filled 2017-10-18: qty 2

## 2017-10-18 MED ORDER — SODIUM CHLORIDE 0.9 % IV SOLN
Freq: Once | INTRAVENOUS | Status: AC
Start: 2017-10-18 — End: 2017-10-18
  Administered 2017-10-18: 10:00:00 via INTRAVENOUS

## 2017-10-18 NOTE — Progress Notes (Signed)
Pt reports urinary frequency/urgency/hesitancy; denies burning. Clean catch urine specimen collected and sent to lab.  UA results shared with Dr. Delton Coombes - Bactrim DS 7 day Rx sent to pt's pharmacy; pt made aware. Urine culture ordered and lab notified.   Tolerated weekly Taxol without any problems. Discharged ambulatory in stable condition with family member at side.

## 2017-10-19 LAB — URINE CULTURE: CULTURE: NO GROWTH

## 2017-10-25 ENCOUNTER — Other Ambulatory Visit: Payer: Self-pay

## 2017-10-25 ENCOUNTER — Inpatient Hospital Stay (HOSPITAL_COMMUNITY): Payer: Medicaid Other

## 2017-10-25 ENCOUNTER — Inpatient Hospital Stay (HOSPITAL_BASED_OUTPATIENT_CLINIC_OR_DEPARTMENT_OTHER): Payer: Medicaid Other | Admitting: Hematology

## 2017-10-25 ENCOUNTER — Encounter (HOSPITAL_COMMUNITY): Payer: Self-pay | Admitting: Hematology

## 2017-10-25 VITALS — BP 123/60 | HR 70 | Temp 97.8°F | Resp 18

## 2017-10-25 VITALS — BP 127/57 | HR 68 | Temp 97.8°F | Resp 18 | Wt 156.2 lb

## 2017-10-25 DIAGNOSIS — I10 Essential (primary) hypertension: Secondary | ICD-10-CM

## 2017-10-25 DIAGNOSIS — Z170 Estrogen receptor positive status [ER+]: Principal | ICD-10-CM

## 2017-10-25 DIAGNOSIS — C50412 Malignant neoplasm of upper-outer quadrant of left female breast: Secondary | ICD-10-CM | POA: Diagnosis not present

## 2017-10-25 DIAGNOSIS — T451X5A Adverse effect of antineoplastic and immunosuppressive drugs, initial encounter: Secondary | ICD-10-CM

## 2017-10-25 DIAGNOSIS — D6481 Anemia due to antineoplastic chemotherapy: Secondary | ICD-10-CM

## 2017-10-25 DIAGNOSIS — Z5111 Encounter for antineoplastic chemotherapy: Secondary | ICD-10-CM | POA: Diagnosis not present

## 2017-10-25 LAB — CBC WITH DIFFERENTIAL/PLATELET
BASOS ABS: 0 10*3/uL (ref 0.0–0.1)
BASOS PCT: 1 %
Eosinophils Absolute: 0.1 10*3/uL (ref 0.0–0.7)
Eosinophils Relative: 3 %
HCT: 27.7 % — ABNORMAL LOW (ref 36.0–46.0)
HEMOGLOBIN: 9.4 g/dL — AB (ref 12.0–15.0)
Lymphocytes Relative: 16 %
Lymphs Abs: 0.7 10*3/uL (ref 0.7–4.0)
MCH: 33.3 pg (ref 26.0–34.0)
MCHC: 33.9 g/dL (ref 30.0–36.0)
MCV: 98.2 fL (ref 78.0–100.0)
Monocytes Absolute: 0.3 10*3/uL (ref 0.1–1.0)
Monocytes Relative: 6 %
NEUTROS ABS: 3.3 10*3/uL (ref 1.7–7.7)
NEUTROS PCT: 74 %
Platelets: 244 10*3/uL (ref 150–400)
RBC: 2.82 MIL/uL — AB (ref 3.87–5.11)
RDW: 17.8 % — ABNORMAL HIGH (ref 11.5–15.5)
WBC: 4.4 10*3/uL (ref 4.0–10.5)

## 2017-10-25 LAB — COMPREHENSIVE METABOLIC PANEL
ALK PHOS: 45 U/L (ref 38–126)
ALT: 22 U/L (ref 0–44)
AST: 23 U/L (ref 15–41)
Albumin: 3.8 g/dL (ref 3.5–5.0)
Anion gap: 7 (ref 5–15)
BILIRUBIN TOTAL: 0.5 mg/dL (ref 0.3–1.2)
BUN: 12 mg/dL (ref 8–23)
CO2: 22 mmol/L (ref 22–32)
CREATININE: 1.09 mg/dL — AB (ref 0.44–1.00)
Calcium: 9.3 mg/dL (ref 8.9–10.3)
Chloride: 111 mmol/L (ref 98–111)
GFR calc Af Amer: >60 mL/min (ref >60)
GFR calc non Af Amer: 53 mL/min — ABNORMAL LOW (ref >60)
GLUCOSE: 120 mg/dL — AB (ref 70–99)
Potassium: 4.2 mmol/L (ref 3.5–5.1)
Sodium: 140 mmol/L (ref 135–145)
Total Protein: 6.8 g/dL (ref 6.5–8.1)

## 2017-10-25 MED ORDER — DIPHENHYDRAMINE HCL 50 MG/ML IJ SOLN
INTRAMUSCULAR | Status: AC
  Filled 2017-10-25: qty 1

## 2017-10-25 MED ORDER — SODIUM CHLORIDE 0.9% FLUSH
10.0000 mL | INTRAVENOUS | Status: DC | PRN
  Administered 2017-10-25: 10 mL
  Filled 2017-10-25: qty 10

## 2017-10-25 MED ORDER — DIPHENHYDRAMINE HCL 50 MG/ML IJ SOLN
50.0000 mg | Freq: Once | INTRAMUSCULAR | Status: AC
  Administered 2017-10-25: 50 mg via INTRAVENOUS

## 2017-10-25 MED ORDER — FAMOTIDINE IN NACL 20-0.9 MG/50ML-% IV SOLN
INTRAVENOUS | Status: AC
  Filled 2017-10-25: qty 50

## 2017-10-25 MED ORDER — FAMOTIDINE IN NACL 20-0.9 MG/50ML-% IV SOLN
20.0000 mg | Freq: Once | INTRAVENOUS | Status: AC
  Administered 2017-10-25: 20 mg via INTRAVENOUS

## 2017-10-25 MED ORDER — SODIUM CHLORIDE 0.9 % IV SOLN
Freq: Once | INTRAVENOUS | Status: AC
  Administered 2017-10-25: 12:00:00 via INTRAVENOUS

## 2017-10-25 MED ORDER — HEPARIN SOD (PORK) LOCK FLUSH 100 UNIT/ML IV SOLN
500.0000 [IU] | Freq: Once | INTRAVENOUS | Status: AC | PRN
  Administered 2017-10-25: 500 [IU]

## 2017-10-25 MED ORDER — SODIUM CHLORIDE 0.9 % IV SOLN
80.0000 mg/m2 | Freq: Once | INTRAVENOUS | Status: AC
  Administered 2017-10-25: 144 mg via INTRAVENOUS
  Filled 2017-10-25: qty 24

## 2017-10-25 MED ORDER — HEPARIN SOD (PORK) LOCK FLUSH 100 UNIT/ML IV SOLN
INTRAVENOUS | Status: AC
  Filled 2017-10-25: qty 5

## 2017-10-25 MED ORDER — DEXAMETHASONE SODIUM PHOSPHATE 100 MG/10ML IJ SOLN
20.0000 mg | Freq: Once | INTRAMUSCULAR | Status: AC
  Administered 2017-10-25: 20 mg via INTRAVENOUS
  Filled 2017-10-25: qty 2

## 2017-10-25 NOTE — Patient Instructions (Signed)
[facility] Discharge Instructions for Patients Receiving Chemotherapy  Today you received the following chemotherapy agents taxol.     If you develop nausea and vomiting that is not controlled by your nausea medication, call the clinic.   BELOW ARE SYMPTOMS THAT SHOULD BE REPORTED IMMEDIATELY:  *FEVER GREATER THAN 100.5 F  *CHILLS WITH OR WITHOUT FEVER  NAUSEA AND VOMITING THAT IS NOT CONTROLLED WITH YOUR NAUSEA MEDICATION  *UNUSUAL SHORTNESS OF BREATH  *UNUSUAL BRUISING OR BLEEDING  TENDERNESS IN MOUTH AND THROAT WITH OR WITHOUT PRESENCE OF ULCERS  *URINARY PROBLEMS  *BOWEL PROBLEMS  UNUSUAL RASH Items with * indicate a potential emergency and should be followed up as soon as possible.  Feel free to call the clinic should you have any questions or concerns. The clinic phone number is [phone].  Please show the CHEMO ALERT CARD at check-in to the Emergency Department and triage nurse.   

## 2017-10-25 NOTE — Assessment & Plan Note (Signed)
1.  Poorly differentiated left breast cancer stage IIIb, (cT3cN1), ER positive, PR and HER-2 negative: - On 06/12/2017, left breast biopsy with sarcomatoid features, left axillary lymph node biopsy negative.  2D echo shows EF of 55-60%. - CT of the chest showed subcarinal adenopathy, PET CT scan negative for metastatic disease. -4 cycles of dose dense Adriamycin and cyclophosphamide from 07/18/2017 through 09/03/2017. - Weekly paclitaxel started on 09/17/2017.  She has completed the week 5 on 10/18/2017.  She is tolerating it very well.  No neuropathy yet. -She will proceed with week 6 of Taxol without any dose modifications.  I have reviewed her blood counts which were adequate.  I will see her every other week to assess for toxicities.  2.  Hypertension: She is taking metoprolol 50 mg twice daily.  We have discontinued Maxide because of elevated creatinine.  She is taking Norvasc 5 mg daily without any problems.  Blood pressure today is very well controlled.

## 2017-10-25 NOTE — Progress Notes (Signed)
Patient tolerated chemotherapy with no complaints voiced.  Port site clean and dry with no bruising or swelling noted at site.  Good blood return noted before and after administration of chemo.  Band aid applied.  VSS with discharge and left ambulatory with no s/s of distress noted.

## 2017-10-25 NOTE — Progress Notes (Signed)
Lisa Thornton [place], [place] [address]   CLINIC:  Medical Oncology/Hematology  PCP:  Lisa Thornton, No Pcp Per No address on file None   REASON FOR VISIT:  Follow-up for stage IIIB left breast cancer, ER+/PR-/HER2-  CURRENT THERAPY: Paclitaxel weekly started on 09/17/2017  BRIEF ONCOLOGIC HISTORY:    Breast cancer of upper-outer quadrant of left female breast ([place])   06/22/2017 Initial Diagnosis    Breast cancer of upper-outer quadrant of left female breast ([place])      06/22/2017 Cancer Staging    Staging form: Breast, AJCC 8th Edition - Clinical stage from 06/22/2017: Stage IIIB (cT3, cN1, cM0, G3, ER+, PR-, HER2-) - Signed by Derek Jack, MD on 06/22/2017      06/29/2017 Imaging    CT chest showing left axillary adenopathy, 1.2 cm anterior carinal adenopathy, left subpectoral lymph node subcentimeter  06/25/2017 2D echocardiogram with ejection fraction of 55-60%      07/16/2017 -  Chemotherapy    The Lisa Thornton had DOXOrubicin (ADRIAMYCIN) chemo injection 110 mg, 60 mg/m2 = 110 mg, Intravenous,  Once, 4 of 4 cycles Administration: 110 mg (07/18/2017), 110 mg (08/01/2017), 110 mg (08/20/2017), 110 mg (09/03/2017) palonosetron (ALOXI) injection 0.25 mg, 0.25 mg, Intravenous,  Once, 4 of 4 cycles Administration: 0.25 mg (07/18/2017), 0.25 mg (08/01/2017), 0.25 mg (08/20/2017), 0.25 mg (09/03/2017) pegfilgrastim-cbqv (UDENYCA) injection 6 mg, 6 mg, Subcutaneous, Once, 4 of 4 cycles Administration: 6 mg (07/19/2017), 6 mg (08/02/2017), 6 mg (08/22/2017), 6 mg (09/05/2017) cyclophosphamide (CYTOXAN) 1,100 mg in sodium chloride 0.9 % 250 mL chemo infusion, 600 mg/m2 = 1,100 mg, Intravenous,  Once, 4 of 4 cycles Administration: 1,100 mg (07/18/2017), 1,100 mg (08/01/2017), 1,100 mg (08/20/2017), 1,100 mg (09/03/2017) PACLitaxel (TAXOL) 120 mg in sodium chloride 0.9 % 250 mL chemo infusion (</= '80mg'$ /m2), 64 mg/m2 = 120 mg (80 % of original dose 80 mg/m2), Intravenous,  Once, 6 of 12  cycles Dose modification: 64 mg/m2 (80 % of original dose 80 mg/m2, Cycle 5, Reason: Provider Judgment) Administration: 120 mg (09/17/2017), 144 mg (09/24/2017), 144 mg (10/03/2017), 144 mg (10/11/2017), 144 mg (10/18/2017) fosaprepitant (EMEND) 150 mg, dexamethasone (DECADRON) 12 mg in sodium chloride 0.9 % 145 mL IVPB, , Intravenous,  Once, 4 of 4 cycles Administration:  (07/18/2017),  (08/01/2017),  (08/20/2017),  (09/03/2017)  for chemotherapy treatment.         CANCER STAGING: Cancer Staging Breast cancer of upper-outer quadrant of left female breast [facility]) Staging form: Breast, AJCC 8th Edition - Clinical stage from 06/22/2017: Stage IIIB (cT3, cN1, cM0, G3, ER+, PR-, HER2-) - Signed by Derek Jack, MD on 06/22/2017    INTERVAL HISTORY:  [name] 62 y.o. female returns for routine follow-up for stage IIIB left breast cancer and consideration for next cycle of chemotherapy. Lisa Thornton is doing well with chemo treatments. She wil finish today with her antibitoic from her UTI we treated her for. She denies numbness tingling. Her appetite and her energy levels have remained good at 75%. Denies any mouth sores. Overall she feels ready for her next treatment.      REVIEW OF SYSTEMS:  Review of Systems  All other systems reviewed and are negative.    PAST MEDICAL/SURGICAL HISTORY:  Past Medical History:  Diagnosis Date  . Cancer [facility])    left breast cancer  . Chest pain   . Hypertension    Past Surgical History:  Procedure Laterality Date  . PORTACATH PLACEMENT Right 06/27/2017   Procedure: INSERTION PORT-A-CATH;  Surgeon: Aviva Signs, MD;  Location: [facility];  Service: General;  Laterality: Right;     SOCIAL HISTORY:  Social History   Socioeconomic History  . Marital status: Divorced    Spouse name: Not on file  . Number of children: Not on file  . Years of education: Not on file  . Highest education level: Not on file  Occupational History  . Not on file  Social Needs    . Financial resource strain: Somewhat hard  . Food insecurity:    Worry: Never true    Inability: Never true  . Transportation needs:    Medical: No    Non-medical: No  Tobacco Use  . Smoking status: Never Smoker  . Smokeless tobacco: Never Used  Substance and Sexual Activity  . Alcohol use: No  . Drug use: No  . Sexual activity: Not Currently  Lifestyle  . Physical activity:    Days per week: 3 days    Minutes per session: 10 min  . Stress: Very much  Relationships  . Social connections:    Talks on phone: More than three times a week    Gets together: More than three times a week    Attends religious service: More than 4 times per year    Active member of club or organization: Yes    Attends meetings of clubs or organizations: Never    Relationship status: Divorced  . Intimate partner violence:    Fear of current or ex partner: Lisa Thornton refused    Emotionally abused: Lisa Thornton refused    Physically abused: Lisa Thornton refused    Forced sexual activity: Lisa Thornton refused  Other Topics Concern  . Not on file  Social History Narrative  . Not on file    FAMILY HISTORY:  Family History  Problem Relation Age of Onset  . Breast cancer Mother   . Thyroid disease Mother   . Heart disease Mother   . Heart attack Father   . Heart attack Sister   . Hypertension Sister   . Heart attack Brother   . Cancer Sister   . Stroke Brother   . Alzheimer's disease Maternal Aunt     CURRENT MEDICATIONS:  Outpatient Encounter Medications as of 10/25/2017  Medication Sig  . amLODipine (NORVASC) 5 MG tablet Take 1 tablet (5 mg total) by mouth daily.  Marland Kitchen HYDROcodone-acetaminophen (NORCO) 5-325 MG tablet Take 1 tablet by mouth every 6 (six) hours as needed for moderate pain.  . metoprolol tartrate (LOPRESSOR) 50 MG tablet Take 50 mg by mouth 2 (two) times daily.  . ondansetron (ZOFRAN) 8 MG tablet Take 1 tablet (8 mg total) by mouth 2 (two) times daily as needed. Start on the third day after  chemotherapy.  . pegfilgrastim-cbqv (UDENYCA) 6 MG/0.6ML injection Inject 6 mg into the skin once. Every 2 weeks x 4 cycles  . prochlorperazine (COMPAZINE) 10 MG tablet Take 1 tablet (10 mg total) by mouth every 6 (six) hours as needed (Nausea or vomiting).  Marland Kitchen sulfamethoxazole-trimethoprim (BACTRIM DS,SEPTRA DS) 800-160 MG tablet Take 1 tablet by mouth 2 (two) times daily for 7 days.   No facility-administered encounter medications on file as of 10/25/2017.     ALLERGIES:  No Known Allergies   PHYSICAL EXAM:  ECOG Performance status: 1  Vitals:   10/25/17 1040  BP: (!) 127/57  Pulse: 68  Resp: 18  Temp: 97.8 F (36.6 C)  SpO2: 100%   Filed Weights   10/25/17 1040  Weight: 156  lb 3.2 oz (70.9 kg)    Physical Exam   LABORATORY DATA:  I have reviewed the labs as listed.  CBC    Component Value Date/Time   WBC 4.4 10/25/2017 1002   RBC 2.82 (L) 10/25/2017 1002   HGB 9.4 (L) 10/25/2017 1002   HCT 27.7 (L) 10/25/2017 1002   PLT 244 10/25/2017 1002   MCV 98.2 10/25/2017 1002   MCH 33.3 10/25/2017 1002   MCHC 33.9 10/25/2017 1002   RDW 17.8 (H) 10/25/2017 1002   LYMPHSABS 0.7 10/25/2017 1002   MONOABS 0.3 10/25/2017 1002   EOSABS 0.1 10/25/2017 1002   BASOSABS 0.0 10/25/2017 1002   CMP Latest Ref Rng & Units 10/25/2017 10/18/2017 10/11/2017  Glucose 70 - 99 mg/dL 120(H) 128(H) 157(H)  BUN 8 - 23 mg/dL '12 10 16  '$ Creatinine 0.44 - 1.00 mg/dL 1.09(H) 0.86 0.90  Sodium 135 - 145 mmol/L 140 140 138  Potassium 3.5 - 5.1 mmol/L 4.2 3.6 3.8  Chloride 98 - 111 mmol/L 111 110 106  CO2 22 - 32 mmol/L '22 23 24  '$ Calcium 8.9 - 10.3 mg/dL 9.3 9.0 8.9  Total Protein 6.5 - 8.1 g/dL 6.8 6.7 6.9  Total Bilirubin 0.3 - 1.2 mg/dL 0.5 0.5 0.5  Alkaline Phos 38 - 126 U/L 45 46 54  AST 15 - 41 U/L '23 18 22  '$ ALT 0 - 44 U/L '22 18 18        '$ ASSESSMENT & PLAN:   Breast cancer of upper-outer quadrant of left female breast ([place]) 1.  Poorly differentiated left breast cancer stage IIIb,  (cT3cN1), ER positive, PR and HER-2 negative: - On 06/12/2017, left breast biopsy with sarcomatoid features, left axillary lymph node biopsy negative.  2D echo shows EF of 55-60%. - CT of the chest showed subcarinal adenopathy, PET CT scan negative for metastatic disease. -4 cycles of dose dense Adriamycin and cyclophosphamide from 07/18/2017 through 09/03/2017. - Weekly paclitaxel started on 09/17/2017.  She has completed the week 5 on 10/18/2017.  She is tolerating it very well.  No neuropathy yet. -She will proceed with week 6 of Taxol without any dose modifications.  I have reviewed her blood counts which were adequate.  I will see her every other week to assess for toxicities.  2.  Hypertension: She is taking metoprolol 50 mg twice daily.  We have discontinued Maxide because of elevated creatinine.  She is taking Norvasc 5 mg daily without any problems.  Blood pressure today is very well controlled.        Orders placed this encounter:  Orders Placed This Encounter  Procedures  . CBC with Differential/Platelet  . Comprehensive metabolic panel  . CBC with Differential/Platelet  . Comprehensive metabolic panel      Derek Jack, MD [place] [phone]

## 2017-10-25 NOTE — Patient Instructions (Addendum)
[place] at [facility] Discharge Instructions  Please continue receiving treatments every week with labs before. The doctor will see you in 2 weeks with your next treatment. Please get labs drawn before every visit.   Today you saw Dr. Raliegh Ip.    Thank you for choosing [place] at [facility] to provide your oncology and hematology care.  To afford each patient quality time with our provider, please arrive at least 15 minutes before your scheduled appointment time.   If you have a lab appointment with the [facility] please come in thru the  Main Entrance and check in at the main information desk  You need to re-schedule your appointment should you arrive 10 or more minutes late.  We strive to give you quality time with our providers, and arriving late affects you and other patients whose appointments are after yours.  Also, if you no show three or more times for appointments you may be dismissed from the clinic at the providers discretion.     Again, thank you for choosing [facility].  Our hope is that these requests will decrease the amount of time that you wait before being seen by our physicians.       _____________________________________________________________  Should you have questions after your visit to [facility], please contact our office at [phone] between the hours of 8:30 a.m. and 4:30 p.m.  Voicemails left after 4:30 p.m. will not be returned until the following business day.  For prescription refill requests, have your pharmacy contact our office.       Resources For Cancer Patients and their Caregivers ? American Cancer Society: Can assist with transportation, wigs, general needs, runs Look Good Feel Better.        (406)484-2562 ? Cancer Care: Provides financial assistance, online support groups, medication/co-pay assistance.  1-800-813-HOPE 206-487-1825) ? [place] Assists [place] cancer patients and their families through emotional , educational and financial support.  608-304-5367 ? [place] DSS Where to apply for food stamps, Medicaid and utility assistance. (670)787-0157 ? [facility]: Transportation to medical appointments. 878 846 1854 ? Social Security Administration: May apply for disability if have a Stage IV cancer. (814)568-1914 747 702 1358 ? [employer], Disability and Transit Services: Assists with nutrition, care and transit needs. [place] Support Programs:   > Cancer Support Group  2nd Tuesday of the month 1pm-2pm, Journey Room   > Creative Journey  3rd Tuesday of the month 1130am-1pm, Journey Room

## 2017-10-31 ENCOUNTER — Other Ambulatory Visit (HOSPITAL_COMMUNITY): Payer: Self-pay | Admitting: Hematology

## 2017-11-01 ENCOUNTER — Inpatient Hospital Stay (HOSPITAL_COMMUNITY): Payer: Medicaid Other | Attending: Hematology

## 2017-11-01 ENCOUNTER — Inpatient Hospital Stay (HOSPITAL_COMMUNITY): Payer: Medicaid Other

## 2017-11-01 ENCOUNTER — Encounter (HOSPITAL_COMMUNITY): Payer: Self-pay

## 2017-11-01 VITALS — BP 142/57 | HR 74 | Temp 98.3°F | Resp 18 | Wt 156.0 lb

## 2017-11-01 DIAGNOSIS — C50412 Malignant neoplasm of upper-outer quadrant of left female breast: Secondary | ICD-10-CM

## 2017-11-01 DIAGNOSIS — Z170 Estrogen receptor positive status [ER+]: Secondary | ICD-10-CM | POA: Diagnosis not present

## 2017-11-01 DIAGNOSIS — R040 Epistaxis: Secondary | ICD-10-CM | POA: Insufficient documentation

## 2017-11-01 DIAGNOSIS — I10 Essential (primary) hypertension: Secondary | ICD-10-CM | POA: Diagnosis not present

## 2017-11-01 DIAGNOSIS — R531 Weakness: Secondary | ICD-10-CM | POA: Insufficient documentation

## 2017-11-01 DIAGNOSIS — R5383 Other fatigue: Secondary | ICD-10-CM | POA: Diagnosis not present

## 2017-11-01 DIAGNOSIS — Z5111 Encounter for antineoplastic chemotherapy: Secondary | ICD-10-CM | POA: Diagnosis present

## 2017-11-01 LAB — CBC WITH DIFFERENTIAL/PLATELET
Basophils Absolute: 0 10*3/uL (ref 0.0–0.1)
Basophils Relative: 0 %
EOS PCT: 3 %
Eosinophils Absolute: 0.1 10*3/uL (ref 0.0–0.7)
HCT: 27.4 % — ABNORMAL LOW (ref 36.0–46.0)
Hemoglobin: 9.5 g/dL — ABNORMAL LOW (ref 12.0–15.0)
LYMPHS ABS: 0.7 10*3/uL (ref 0.7–4.0)
LYMPHS PCT: 22 %
MCH: 34.8 pg — ABNORMAL HIGH (ref 26.0–34.0)
MCHC: 34.7 g/dL (ref 30.0–36.0)
MCV: 100.4 fL — AB (ref 78.0–100.0)
MONOS PCT: 4 %
Monocytes Absolute: 0.1 10*3/uL (ref 0.1–1.0)
Neutro Abs: 2.3 10*3/uL (ref 1.7–7.7)
Neutrophils Relative %: 71 %
PLATELETS: 232 10*3/uL (ref 150–400)
RBC: 2.73 MIL/uL — AB (ref 3.87–5.11)
RDW: 16.9 % — ABNORMAL HIGH (ref 11.5–15.5)
WBC: 3.3 10*3/uL — AB (ref 4.0–10.5)

## 2017-11-01 LAB — COMPREHENSIVE METABOLIC PANEL
ALT: 22 U/L (ref 0–44)
AST: 20 U/L (ref 15–41)
Albumin: 3.8 g/dL (ref 3.5–5.0)
Alkaline Phosphatase: 49 U/L (ref 38–126)
Anion gap: 8 (ref 5–15)
BILIRUBIN TOTAL: 0.4 mg/dL (ref 0.3–1.2)
BUN: 13 mg/dL (ref 8–23)
CHLORIDE: 110 mmol/L (ref 98–111)
CO2: 23 mmol/L (ref 22–32)
Calcium: 9 mg/dL (ref 8.9–10.3)
Creatinine, Ser: 0.86 mg/dL (ref 0.44–1.00)
Glucose, Bld: 135 mg/dL — ABNORMAL HIGH (ref 70–99)
POTASSIUM: 3.9 mmol/L (ref 3.5–5.1)
Sodium: 141 mmol/L (ref 135–145)
TOTAL PROTEIN: 6.5 g/dL (ref 6.5–8.1)

## 2017-11-01 MED ORDER — SODIUM CHLORIDE 0.9 % IV SOLN
Freq: Once | INTRAVENOUS | Status: AC
  Administered 2017-11-01: 10:00:00 via INTRAVENOUS

## 2017-11-01 MED ORDER — HEPARIN SOD (PORK) LOCK FLUSH 100 UNIT/ML IV SOLN
500.0000 [IU] | Freq: Once | INTRAVENOUS | Status: AC | PRN
  Administered 2017-11-01: 500 [IU]

## 2017-11-01 MED ORDER — SODIUM CHLORIDE 0.9 % IV SOLN
80.0000 mg/m2 | Freq: Once | INTRAVENOUS | Status: AC
  Administered 2017-11-01: 144 mg via INTRAVENOUS
  Filled 2017-11-01: qty 24

## 2017-11-01 MED ORDER — SODIUM CHLORIDE 0.9 % IV SOLN
20.0000 mg | Freq: Once | INTRAVENOUS | Status: AC
  Administered 2017-11-01: 20 mg via INTRAVENOUS
  Filled 2017-11-01: qty 2

## 2017-11-01 MED ORDER — SODIUM CHLORIDE 0.9% FLUSH
10.0000 mL | INTRAVENOUS | Status: DC | PRN
  Administered 2017-11-01: 10 mL
  Filled 2017-11-01: qty 10

## 2017-11-01 MED ORDER — DIPHENHYDRAMINE HCL 50 MG/ML IJ SOLN
50.0000 mg | Freq: Once | INTRAMUSCULAR | Status: AC
  Administered 2017-11-01: 50 mg via INTRAVENOUS

## 2017-11-01 MED ORDER — FAMOTIDINE IN NACL 20-0.9 MG/50ML-% IV SOLN
INTRAVENOUS | Status: AC
  Filled 2017-11-01: qty 50

## 2017-11-01 MED ORDER — DIPHENHYDRAMINE HCL 50 MG/ML IJ SOLN
INTRAMUSCULAR | Status: AC
  Filled 2017-11-01: qty 1

## 2017-11-01 MED ORDER — FAMOTIDINE IN NACL 20-0.9 MG/50ML-% IV SOLN
20.0000 mg | Freq: Once | INTRAVENOUS | Status: AC
  Administered 2017-11-01: 20 mg via INTRAVENOUS

## 2017-11-01 NOTE — Progress Notes (Signed)
Norman reviewed and patient approved for treatment today.

## 2017-11-01 NOTE — Progress Notes (Signed)
Lisa Thornton tolerated Taxol infusion well without complaints or incident. Labs reviewed prior to administering this medication. VSS upon discharge. Pt discharged self ambulatory in satisfactory condition

## 2017-11-01 NOTE — Patient Instructions (Signed)
[facility] Discharge Instructions for Patients Receiving Chemotherapy   Beginning January 23rd 2017 lab work for the Cancer Center will be done in the  Main lab at [place] on 1st floor. If you have a lab appointment with the Cancer Center please come in thru the  Main Entrance and check in at the main information desk   Today you received the following chemotherapy agents Taxol. Follow-up as scheduled. Call clinic for any questions or concerns  To help prevent nausea and vomiting after your treatment, we encourage you to take your nausea medication   If you develop nausea and vomiting, or diarrhea that is not controlled by your medication, call the clinic.  The clinic phone number is [phone]. Office hours are Monday-Friday 8:30am-5:00pm.  BELOW ARE SYMPTOMS THAT SHOULD BE REPORTED IMMEDIATELY:  *FEVER GREATER THAN 101.0 F  *CHILLS WITH OR WITHOUT FEVER  NAUSEA AND VOMITING THAT IS NOT CONTROLLED WITH YOUR NAUSEA MEDICATION  *UNUSUAL SHORTNESS OF BREATH  *UNUSUAL BRUISING OR BLEEDING  TENDERNESS IN MOUTH AND THROAT WITH OR WITHOUT PRESENCE OF ULCERS  *URINARY PROBLEMS  *BOWEL PROBLEMS  UNUSUAL RASH Items with * indicate a potential emergency and should be followed up as soon as possible. If you have an emergency after office hours please contact your primary care physician or go to the nearest emergency department.  Please call the clinic during office hours if you have any questions or concerns.   You may also contact the Patient Navigator at [phone] should you have any questions or need assistance in obtaining follow up care.      Resources For Cancer Patients and their Caregivers ? American Cancer Society: Can assist with transportation, wigs, general needs, runs Look Good Feel Better.        1-888-227-6333 ? Cancer Care: Provides financial assistance, online support groups, medication/co-pay assistance.  1-800-813-HOPE  (4673) ? [facility] Assists [place] cancer patients and their families through emotional , educational and financial support.  336-427-4357 ? [place] DSS Where to apply for food stamps, Medicaid and utility assistance. 336-342-1394 ? [facility]ATS: Transportation to medical appointments. 336-347-2287 ? Social Security Administration: May apply for disability if have a Stage IV cancer. 336-342-7796 1-800-772-1213 ? [place] Aging, Disability and Transit Services: Assists with nutrition, care and transit needs. 336-349-2343         

## 2017-11-08 ENCOUNTER — Encounter (HOSPITAL_COMMUNITY): Payer: Self-pay | Admitting: Hematology

## 2017-11-08 ENCOUNTER — Other Ambulatory Visit: Payer: Self-pay

## 2017-11-08 ENCOUNTER — Inpatient Hospital Stay (HOSPITAL_BASED_OUTPATIENT_CLINIC_OR_DEPARTMENT_OTHER): Payer: Medicaid Other | Admitting: Hematology

## 2017-11-08 ENCOUNTER — Inpatient Hospital Stay (HOSPITAL_COMMUNITY): Payer: Medicaid Other

## 2017-11-08 VITALS — BP 156/73 | HR 82 | Temp 98.6°F | Resp 16

## 2017-11-08 DIAGNOSIS — Z170 Estrogen receptor positive status [ER+]: Principal | ICD-10-CM

## 2017-11-08 DIAGNOSIS — C50412 Malignant neoplasm of upper-outer quadrant of left female breast: Secondary | ICD-10-CM

## 2017-11-08 DIAGNOSIS — I10 Essential (primary) hypertension: Secondary | ICD-10-CM

## 2017-11-08 DIAGNOSIS — Z5111 Encounter for antineoplastic chemotherapy: Secondary | ICD-10-CM | POA: Diagnosis not present

## 2017-11-08 LAB — CBC WITH DIFFERENTIAL/PLATELET
BASOS ABS: 0 10*3/uL (ref 0.0–0.1)
BASOS PCT: 1 %
Eosinophils Absolute: 0.1 10*3/uL (ref 0.0–0.7)
Eosinophils Relative: 1 %
HEMATOCRIT: 28.2 % — AB (ref 36.0–46.0)
HEMOGLOBIN: 9.7 g/dL — AB (ref 12.0–15.0)
Lymphocytes Relative: 19 %
Lymphs Abs: 0.7 10*3/uL (ref 0.7–4.0)
MCH: 34.9 pg — ABNORMAL HIGH (ref 26.0–34.0)
MCHC: 34.4 g/dL (ref 30.0–36.0)
MCV: 101.4 fL — ABNORMAL HIGH (ref 78.0–100.0)
Monocytes Absolute: 0.3 10*3/uL (ref 0.1–1.0)
Monocytes Relative: 8 %
NEUTROS ABS: 2.5 10*3/uL (ref 1.7–7.7)
NEUTROS PCT: 71 %
Platelets: 211 10*3/uL (ref 150–400)
RBC: 2.78 MIL/uL — ABNORMAL LOW (ref 3.87–5.11)
RDW: 15.5 % (ref 11.5–15.5)
WBC: 3.6 10*3/uL — ABNORMAL LOW (ref 4.0–10.5)

## 2017-11-08 LAB — COMPREHENSIVE METABOLIC PANEL
ALBUMIN: 3.7 g/dL (ref 3.5–5.0)
ALK PHOS: 42 U/L (ref 38–126)
ALT: 16 U/L (ref 0–44)
AST: 16 U/L (ref 15–41)
Anion gap: 7 (ref 5–15)
BILIRUBIN TOTAL: 0.4 mg/dL (ref 0.3–1.2)
BUN: 21 mg/dL (ref 8–23)
CALCIUM: 9.2 mg/dL (ref 8.9–10.3)
CO2: 23 mmol/L (ref 22–32)
Chloride: 108 mmol/L (ref 98–111)
Creatinine, Ser: 0.83 mg/dL (ref 0.44–1.00)
GFR calc Af Amer: >60 mL/min (ref >60)
GFR calc non Af Amer: >60 mL/min (ref >60)
GLUCOSE: 138 mg/dL — AB (ref 70–99)
Potassium: 3.9 mmol/L (ref 3.5–5.1)
Sodium: 138 mmol/L (ref 135–145)
TOTAL PROTEIN: 6.8 g/dL (ref 6.5–8.1)

## 2017-11-08 MED ORDER — SODIUM CHLORIDE 0.9 % IV SOLN
Freq: Once | INTRAVENOUS | Status: AC
  Administered 2017-11-08: 12:00:00 via INTRAVENOUS

## 2017-11-08 MED ORDER — SODIUM CHLORIDE 0.9% FLUSH
10.0000 mL | INTRAVENOUS | Status: DC | PRN
  Administered 2017-11-08: 10 mL
  Filled 2017-11-08: qty 10

## 2017-11-08 MED ORDER — FAMOTIDINE IN NACL 20-0.9 MG/50ML-% IV SOLN
20.0000 mg | Freq: Once | INTRAVENOUS | Status: AC
  Administered 2017-11-08: 20 mg via INTRAVENOUS
  Filled 2017-11-08: qty 50

## 2017-11-08 MED ORDER — SODIUM CHLORIDE 0.9 % IV SOLN
20.0000 mg | Freq: Once | INTRAVENOUS | Status: AC
  Administered 2017-11-08: 20 mg via INTRAVENOUS
  Filled 2017-11-08: qty 2

## 2017-11-08 MED ORDER — HEPARIN SOD (PORK) LOCK FLUSH 100 UNIT/ML IV SOLN
500.0000 [IU] | Freq: Once | INTRAVENOUS | Status: AC | PRN
  Administered 2017-11-08: 500 [IU]
  Filled 2017-11-08 (×2): qty 5

## 2017-11-08 MED ORDER — DIPHENHYDRAMINE HCL 50 MG/ML IJ SOLN
50.0000 mg | Freq: Once | INTRAMUSCULAR | Status: AC
  Administered 2017-11-08: 50 mg via INTRAVENOUS
  Filled 2017-11-08: qty 1

## 2017-11-08 MED ORDER — SODIUM CHLORIDE 0.9 % IV SOLN
80.0000 mg/m2 | Freq: Once | INTRAVENOUS | Status: AC
  Administered 2017-11-08: 144 mg via INTRAVENOUS
  Filled 2017-11-08: qty 24

## 2017-11-08 NOTE — Progress Notes (Signed)
[address], [place] [address]   CLINIC:  Medical Oncology/Hematology  PCP:  Patient, No Pcp Per No address on file None   REASON FOR VISIT:  Follow-up for breast cancer, ER+/PR-/HER2-  CURRENT THERAPY: paclitaxel weekly started on 09/17/17   BRIEF ONCOLOGIC HISTORY:    Breast cancer of upper-outer quadrant of left female breast ([place])   06/22/2017 Initial Diagnosis    Breast cancer of upper-outer quadrant of left female breast ([place])    06/22/2017 Cancer Staging    Staging form: Breast, AJCC 8th Edition - Clinical stage from 06/22/2017: Stage IIIB (cT3, cN1, cM0, G3, ER+, PR-, HER2-) - Signed by Derek Jack, MD on 06/22/2017    06/29/2017 Imaging    CT chest showing left axillary adenopathy, 1.2 cm anterior carinal adenopathy, left subpectoral lymph node subcentimeter  06/25/2017 2D echocardiogram with ejection fraction of 55-60%    07/16/2017 -  Chemotherapy    The patient had DOXOrubicin (ADRIAMYCIN) chemo injection 110 mg, 60 mg/m2 = 110 mg, Intravenous,  Once, 4 of 4 cycles Administration: 110 mg (07/18/2017), 110 mg (08/01/2017), 110 mg (08/20/2017), 110 mg (09/03/2017) palonosetron (ALOXI) injection 0.25 mg, 0.25 mg, Intravenous,  Once, 4 of 4 cycles Administration: 0.25 mg (07/18/2017), 0.25 mg (08/01/2017), 0.25 mg (08/20/2017), 0.25 mg (09/03/2017) pegfilgrastim-cbqv (UDENYCA) injection 6 mg, 6 mg, Subcutaneous, Once, 4 of 4 cycles Administration: 6 mg (07/19/2017), 6 mg (08/02/2017), 6 mg (08/22/2017), 6 mg (09/05/2017) cyclophosphamide (CYTOXAN) 1,100 mg in sodium chloride 0.9 % 250 mL chemo infusion, 600 mg/m2 = 1,100 mg, Intravenous,  Once, 4 of 4 cycles Administration: 1,100 mg (07/18/2017), 1,100 mg (08/01/2017), 1,100 mg (08/20/2017), 1,100 mg (09/03/2017) PACLitaxel (TAXOL) 120 mg in sodium chloride 0.9 % 250 mL chemo infusion (</= '80mg'$ /m2), 64 mg/m2 = 120 mg (80 % of original dose 80 mg/m2), Intravenous,  Once, 8 of 12 cycles Dose modification:  64 mg/m2 (80 % of original dose 80 mg/m2, Cycle 5, Reason: Provider Judgment) Administration: 120 mg (09/17/2017), 144 mg (09/24/2017), 144 mg (10/03/2017), 144 mg (10/11/2017), 144 mg (10/18/2017), 144 mg (10/25/2017), 144 mg (11/01/2017), 144 mg (11/08/2017) fosaprepitant (EMEND) 150 mg, dexamethasone (DECADRON) 12 mg in sodium chloride 0.9 % 145 mL IVPB, , Intravenous,  Once, 4 of 4 cycles Administration:  (07/18/2017),  (08/01/2017),  (08/20/2017),  (09/03/2017)  for chemotherapy treatment.       CANCER STAGING: Cancer Staging Breast cancer of upper-outer quadrant of left female breast [facility]) Staging form: Breast, AJCC 8th Edition - Clinical stage from 06/22/2017: Stage IIIB (cT3, cN1, cM0, G3, ER+, PR-, HER2-) - Signed by Derek Jack, MD on 06/22/2017    INTERVAL HISTORY:  Ms. Ricke 62 y.o. female returns for routine follow-up for breast cancer and consideration for her 8th cycle of chemotherapy. Patient is here today and feeling good. She is ready for her treatment. Patient has no complaints at this time. Her appetite is good and she is maintaining her weight. Her energy is 75% at this time. Patient denies any numbness or tingling in her hand and feet. Denies any new pains. Denies any nausea, vomiting, or diarrhea. Patient lives at home and has no problems with ADLs.     REVIEW OF SYSTEMS:  Review of Systems  All other systems reviewed and are negative.    PAST MEDICAL/SURGICAL HISTORY:  Past Medical History:  Diagnosis Date  . Cancer [facility])    left breast cancer  . Chest pain   . Hypertension    Past Surgical  History:  Procedure Laterality Date  . PORTACATH PLACEMENT Right 06/27/2017   Procedure: INSERTION PORT-A-CATH;  Surgeon: Aviva Signs, MD;  Location: [facility];  Service: General;  Laterality: Right;     SOCIAL HISTORY:  Social History   Socioeconomic History  . Marital status: Divorced    Spouse name: Not on file  . Number of children: Not on file  . Years of education:  Not on file  . Highest education level: Not on file  Occupational History  . Not on file  Social Needs  . Financial resource strain: Somewhat hard  . Food insecurity:    Worry: Never true    Inability: Never true  . Transportation needs:    Medical: No    Non-medical: No  Tobacco Use  . Smoking status: Never Smoker  . Smokeless tobacco: Never Used  Substance and Sexual Activity  . Alcohol use: No  . Drug use: No  . Sexual activity: Not Currently  Lifestyle  . Physical activity:    Days per week: 3 days    Minutes per session: 10 min  . Stress: Very much  Relationships  . Social connections:    Talks on phone: More than three times a week    Gets together: More than three times a week    Attends religious service: More than 4 times per year    Active member of club or organization: Yes    Attends meetings of clubs or organizations: Never    Relationship status: Divorced  . Intimate partner violence:    Fear of current or ex partner: Patient refused    Emotionally abused: Patient refused    Physically abused: Patient refused    Forced sexual activity: Patient refused  Other Topics Concern  . Not on file  Social History Narrative  . Not on file    FAMILY HISTORY:  Family History  Problem Relation Age of Onset  . Breast cancer Mother   . Thyroid disease Mother   . Heart disease Mother   . Heart attack Father   . Heart attack Sister   . Hypertension Sister   . Heart attack Brother   . Cancer Sister   . Stroke Brother   . Alzheimer's disease Maternal Aunt     CURRENT MEDICATIONS:  Outpatient Encounter Medications as of 11/08/2017  Medication Sig  . amLODipine (NORVASC) 5 MG tablet Take 1 tablet (5 mg total) by mouth daily.  Marland Kitchen HYDROcodone-acetaminophen (NORCO) 5-325 MG tablet Take 1 tablet by mouth every 6 (six) hours as needed for moderate pain.  . metoprolol tartrate (LOPRESSOR) 50 MG tablet Take 50 mg by mouth 2 (two) times daily.  . ondansetron (ZOFRAN) 8 MG  tablet Take 1 tablet (8 mg total) by mouth 2 (two) times daily as needed. Start on the third day after chemotherapy.  . pegfilgrastim-cbqv (UDENYCA) 6 MG/0.6ML injection Inject 6 mg into the skin once. Every 2 weeks x 4 cycles  . prochlorperazine (COMPAZINE) 10 MG tablet Take 1 tablet (10 mg total) by mouth every 6 (six) hours as needed (Nausea or vomiting).   No facility-administered encounter medications on file as of 11/08/2017.     ALLERGIES:  No Known Allergies   PHYSICAL EXAM:  ECOG Performance status: 1  Vitals:   11/08/17 1058  BP: 137/61  Pulse: 79  Resp: 18  Temp: 97.8 F (36.6 C)  SpO2: 100%   Filed Weights   11/08/17 1058  Weight: 157 lb 12.8 oz (71.6 kg)  Physical Exam  Constitutional: She is oriented to person, place, and time. She appears well-developed and well-nourished.  Cardiovascular: Normal rate, regular rhythm and normal heart sounds.  Pulmonary/Chest: Effort normal and breath sounds normal.  Neurological: She is alert and oriented to person, place, and time.  Skin: Skin is warm and dry.  Left breast exam shows mass at 3 o'clock position, decreased in size by 50%.  No palpable adenopathy.  No palpable mass in the right breast.   LABORATORY DATA:  I have reviewed the labs as listed.  CBC    Component Value Date/Time   WBC 3.6 (L) 11/08/2017 1030   RBC 2.78 (L) 11/08/2017 1030   HGB 9.7 (L) 11/08/2017 1030   HCT 28.2 (L) 11/08/2017 1030   PLT 211 11/08/2017 1030   MCV 101.4 (H) 11/08/2017 1030   MCH 34.9 (H) 11/08/2017 1030   MCHC 34.4 11/08/2017 1030   RDW 15.5 11/08/2017 1030   LYMPHSABS 0.7 11/08/2017 1030   MONOABS 0.3 11/08/2017 1030   EOSABS 0.1 11/08/2017 1030   BASOSABS 0.0 11/08/2017 1030   CMP Latest Ref Rng & Units 11/08/2017 11/01/2017 10/25/2017  Glucose 70 - 99 mg/dL 138(H) 135(H) 120(H)  BUN 8 - 23 mg/dL '21 13 12  '$ Creatinine 0.44 - 1.00 mg/dL 0.83 0.86 1.09(H)  Sodium 135 - 145 mmol/L 138 141 140  Potassium 3.5 - 5.1 mmol/L  3.9 3.9 4.2  Chloride 98 - 111 mmol/L 108 110 111  CO2 22 - 32 mmol/L '23 23 22  '$ Calcium 8.9 - 10.3 mg/dL 9.2 9.0 9.3  Total Protein 6.5 - 8.1 g/dL 6.8 6.5 6.8  Total Bilirubin 0.3 - 1.2 mg/dL 0.4 0.4 0.5  Alkaline Phos 38 - 126 U/L 42 49 45  AST 15 - 41 U/L '16 20 23  '$ ALT 0 - 44 U/L '16 22 22        '$ ASSESSMENT & PLAN:   Breast cancer of upper-outer quadrant of left female breast ([place]) 1.  Poorly differentiated left breast cancer stage IIIb, (cT3cN1), ER positive, PR and HER-2 negative: - On 06/12/2017, left breast biopsy with sarcomatoid features, left axillary lymph node biopsy negative.  2D echo shows EF of 55-60%. - CT of the chest showed subcarinal adenopathy, PET CT scan negative for metastatic disease. -4 cycles of dose dense Adriamycin and cyclophosphamide from 07/18/2017 through 09/03/2017. - Weekly paclitaxel started on 09/17/2017.  She has completed the week 5 on 10/18/2017.  She is tolerating it very well.  No neuropathy yet. -She is tolerating her treatments very well.  She will proceed with week 8 of paclitaxel without any dose modifications. -Examination today revealed breast mass at 3 o'clock position in the left breast, decreased in size by 50%.  No palpable axillary adenopathy. - She will come back in 2 weeks for follow-up.  2.  Hypertension: She is taking metoprolol 50 mg twice daily.  We have discontinued Maxide because of elevated creatinine.  She is taking Norvasc 5 mg daily without any problems.  Blood pressure today is very well controlled.        Orders placed this encounter:  Orders Placed This Encounter  Procedures  . CBC with Differential/Platelet  . Comprehensive metabolic panel  . CBC with Differential/Platelet  . Comprehensive metabolic panel      Derek Jack, MD [place] [phone]

## 2017-11-08 NOTE — Assessment & Plan Note (Signed)
1.  Poorly differentiated left breast cancer stage IIIb, (cT3cN1), ER positive, PR and HER-2 negative: - On 06/12/2017, left breast biopsy with sarcomatoid features, left axillary lymph node biopsy negative.  2D echo shows EF of 55-60%. - CT of the chest showed subcarinal adenopathy, PET CT scan negative for metastatic disease. -4 cycles of dose dense Adriamycin and cyclophosphamide from 07/18/2017 through 09/03/2017. - Weekly paclitaxel started on 09/17/2017.  She has completed the week 5 on 10/18/2017.  She is tolerating it very well.  No neuropathy yet. -She is tolerating her treatments very well.  She will proceed with week 8 of paclitaxel without any dose modifications. -Examination today revealed breast mass at 3 o'clock position in the left breast, decreased in size by 50%.  No palpable axillary adenopathy. - She will come back in 2 weeks for follow-up.  2.  Hypertension: She is taking metoprolol 50 mg twice daily.  We have discontinued Maxide because of elevated creatinine.  She is taking Norvasc 5 mg daily without any problems.  Blood pressure today is very well controlled.

## 2017-11-08 NOTE — Patient Instructions (Signed)
[facility] Discharge Instructions for Patients Receiving Chemotherapy   Beginning January 23rd 2017 lab work for the Cancer Center will be done in the  Main lab at [place] on 1st floor. If you have a lab appointment with the Cancer Center please come in thru the  Main Entrance and check in at the main information desk   Today you received the following chemotherapy agents   To help prevent nausea and vomiting after your treatment, we encourage you to take your nausea medication     If you develop nausea and vomiting, or diarrhea that is not controlled by your medication, call the clinic.  The clinic phone number is [phone]. Office hours are Monday-Friday 8:30am-5:00pm.  BELOW ARE SYMPTOMS THAT SHOULD BE REPORTED IMMEDIATELY:  *FEVER GREATER THAN 101.0 F  *CHILLS WITH OR WITHOUT FEVER  NAUSEA AND VOMITING THAT IS NOT CONTROLLED WITH YOUR NAUSEA MEDICATION  *UNUSUAL SHORTNESS OF BREATH  *UNUSUAL BRUISING OR BLEEDING  TENDERNESS IN MOUTH AND THROAT WITH OR WITHOUT PRESENCE OF ULCERS  *URINARY PROBLEMS  *BOWEL PROBLEMS  UNUSUAL RASH Items with * indicate a potential emergency and should be followed up as soon as possible. If you have an emergency after office hours please contact your primary care physician or go to the nearest emergency department.  Please call the clinic during office hours if you have any questions or concerns.   You may also contact the Patient Navigator at [phone] should you have any questions or need assistance in obtaining follow up care.      Resources For Cancer Patients and their Caregivers ? American Cancer Society: Can assist with transportation, wigs, general needs, runs Look Good Feel Better.        1-888-227-6333 ? Cancer Care: Provides financial assistance, online support groups, medication/co-pay assistance.  1-800-813-HOPE (4673) ? [facility] Assists [place] cancer  patients and their families through emotional , educational and financial support.  336-427-4357 ? [place] DSS Where to apply for food stamps, Medicaid and utility assistance. 336-342-1394 ? [facility]ATS: Transportation to medical appointments. 336-347-2287 ? Social Security Administration: May apply for disability if have a Stage IV cancer. 336-342-7796 1-800-772-1213 ? [place] Aging, Disability and Transit Services: Assists with nutrition, care and transit needs. [phone]         

## 2017-11-08 NOTE — Progress Notes (Signed)
Labs reviewed today by Dr. Delton Coombes and pt also seen today by MD. Will proceed with treatment today. Cycle 8.   Treatment given per orders. Patient tolerated it well without problems. Vitals stable and discharged home from clinic ambulatory. Follow up as scheduled.

## 2017-11-08 NOTE — Patient Instructions (Addendum)
[place] at [facility] Discharge Instructions  We will see you back in 2 weeks with labs. Continue your weekly treatments.   Thank you for choosing [place] at [facility] to provide your oncology and hematology care.  To afford each patient quality time with our provider, please arrive at least 15 minutes before your scheduled appointment time.   If you have a lab appointment with the [facility] please come in thru the  Main Entrance and check in at the main information desk  You need to re-schedule your appointment should you arrive 10 or more minutes late.  We strive to give you quality time with our providers, and arriving late affects you and other patients whose appointments are after yours.  Also, if you no show three or more times for appointments you may be dismissed from the clinic at the providers discretion.     Again, thank you for choosing [facility].  Our hope is that these requests will decrease the amount of time that you wait before being seen by our physicians.       _____________________________________________________________  Should you have questions after your visit to [facility], please contact our office at [phone] between the hours of 8:00 a.m. and 4:30 p.m.  Voicemails left after 4:00 p.m. will not be returned until the following business day.  For prescription refill requests, have your pharmacy contact our office and allow 72 hours.    Cancer Center Support Programs:   > Cancer Support Group  2nd Tuesday of the month 1pm-2pm, Journey Room

## 2017-11-15 ENCOUNTER — Encounter (HOSPITAL_COMMUNITY): Payer: Self-pay

## 2017-11-15 ENCOUNTER — Inpatient Hospital Stay (HOSPITAL_COMMUNITY): Payer: Medicaid Other

## 2017-11-15 VITALS — BP 137/63 | HR 77 | Temp 98.4°F | Resp 16 | Wt 156.1 lb

## 2017-11-15 DIAGNOSIS — C50412 Malignant neoplasm of upper-outer quadrant of left female breast: Secondary | ICD-10-CM

## 2017-11-15 DIAGNOSIS — Z170 Estrogen receptor positive status [ER+]: Principal | ICD-10-CM

## 2017-11-15 DIAGNOSIS — Z5111 Encounter for antineoplastic chemotherapy: Secondary | ICD-10-CM | POA: Diagnosis not present

## 2017-11-15 LAB — COMPREHENSIVE METABOLIC PANEL
ALK PHOS: 52 U/L (ref 38–126)
ALT: 19 U/L (ref 0–44)
ANION GAP: 8 (ref 5–15)
AST: 21 U/L (ref 15–41)
Albumin: 3.6 g/dL (ref 3.5–5.0)
BILIRUBIN TOTAL: 0.6 mg/dL (ref 0.3–1.2)
BUN: 10 mg/dL (ref 8–23)
CALCIUM: 9.1 mg/dL (ref 8.9–10.3)
CO2: 25 mmol/L (ref 22–32)
CREATININE: 0.93 mg/dL (ref 0.44–1.00)
Chloride: 106 mmol/L (ref 98–111)
Glucose, Bld: 133 mg/dL — ABNORMAL HIGH (ref 70–99)
Potassium: 4.1 mmol/L (ref 3.5–5.1)
Sodium: 139 mmol/L (ref 135–145)
TOTAL PROTEIN: 6.9 g/dL (ref 6.5–8.1)

## 2017-11-15 LAB — CBC WITH DIFFERENTIAL/PLATELET
Basophils Absolute: 0 10*3/uL (ref 0.0–0.1)
Basophils Relative: 0 %
EOS ABS: 0 10*3/uL (ref 0.0–0.7)
Eosinophils Relative: 1 %
HEMATOCRIT: 27.6 % — AB (ref 36.0–46.0)
HEMOGLOBIN: 9.5 g/dL — AB (ref 12.0–15.0)
LYMPHS ABS: 0.7 10*3/uL (ref 0.7–4.0)
LYMPHS PCT: 16 %
MCH: 34.9 pg — AB (ref 26.0–34.0)
MCHC: 34.4 g/dL (ref 30.0–36.0)
MCV: 101.5 fL — AB (ref 78.0–100.0)
MONOS PCT: 9 %
Monocytes Absolute: 0.4 10*3/uL (ref 0.1–1.0)
NEUTROS ABS: 3.3 10*3/uL (ref 1.7–7.7)
NEUTROS PCT: 74 %
Platelets: 249 10*3/uL (ref 150–400)
RBC: 2.72 MIL/uL — ABNORMAL LOW (ref 3.87–5.11)
RDW: 14.3 % (ref 11.5–15.5)
WBC: 4.5 10*3/uL (ref 4.0–10.5)

## 2017-11-15 MED ORDER — HEPARIN SOD (PORK) LOCK FLUSH 100 UNIT/ML IV SOLN
500.0000 [IU] | Freq: Once | INTRAVENOUS | Status: AC | PRN
  Administered 2017-11-15: 500 [IU]

## 2017-11-15 MED ORDER — DEXAMETHASONE SODIUM PHOSPHATE 100 MG/10ML IJ SOLN
20.0000 mg | Freq: Once | INTRAMUSCULAR | Status: AC
  Administered 2017-11-15: 20 mg via INTRAVENOUS
  Filled 2017-11-15: qty 2

## 2017-11-15 MED ORDER — FAMOTIDINE IN NACL 20-0.9 MG/50ML-% IV SOLN
20.0000 mg | Freq: Once | INTRAVENOUS | Status: AC
  Administered 2017-11-15: 20 mg via INTRAVENOUS

## 2017-11-15 MED ORDER — DIPHENHYDRAMINE HCL 50 MG/ML IJ SOLN
INTRAMUSCULAR | Status: AC
  Filled 2017-11-15: qty 1

## 2017-11-15 MED ORDER — HEPARIN SOD (PORK) LOCK FLUSH 100 UNIT/ML IV SOLN
INTRAVENOUS | Status: AC
  Filled 2017-11-15: qty 5

## 2017-11-15 MED ORDER — FAMOTIDINE IN NACL 20-0.9 MG/50ML-% IV SOLN
INTRAVENOUS | Status: AC
  Filled 2017-11-15: qty 50

## 2017-11-15 MED ORDER — SODIUM CHLORIDE 0.9 % IV SOLN
80.0000 mg/m2 | Freq: Once | INTRAVENOUS | Status: AC
  Administered 2017-11-15: 144 mg via INTRAVENOUS
  Filled 2017-11-15: qty 24

## 2017-11-15 MED ORDER — SODIUM CHLORIDE 0.9 % IV SOLN
Freq: Once | INTRAVENOUS | Status: AC
  Administered 2017-11-15: 12:00:00 via INTRAVENOUS

## 2017-11-15 MED ORDER — SODIUM CHLORIDE 0.9% FLUSH
10.0000 mL | INTRAVENOUS | Status: DC | PRN
  Administered 2017-11-15: 10 mL
  Filled 2017-11-15: qty 10

## 2017-11-15 MED ORDER — DIPHENHYDRAMINE HCL 50 MG/ML IJ SOLN
50.0000 mg | Freq: Once | INTRAMUSCULAR | Status: AC
  Administered 2017-11-15: 50 mg via INTRAVENOUS

## 2017-11-15 NOTE — Patient Instructions (Signed)
[facility] Discharge Instructions for Patients Receiving Chemotherapy   Beginning January 23rd 2017 lab work for the Cancer Center will be done in the  Main lab at  on 1st floor. If you have a lab appointment with the Cancer Center please come in thru the  Main Entrance and check in at the main information desk   Today you received the following chemotherapy agents Taxol. Follow-up as scheduled. Call clinic for any questions or concerns  To help prevent nausea and vomiting after your treatment, we encourage you to take your nausea medication   If you develop nausea and vomiting, or diarrhea that is not controlled by your medication, call the clinic.  The clinic phone number is [phone]. Office hours are Monday-Friday 8:30am-5:00pm.  BELOW ARE SYMPTOMS THAT SHOULD BE REPORTED IMMEDIATELY:  *FEVER GREATER THAN 101.0 F  *CHILLS WITH OR WITHOUT FEVER  NAUSEA AND VOMITING THAT IS NOT CONTROLLED WITH YOUR NAUSEA MEDICATION  *UNUSUAL SHORTNESS OF BREATH  *UNUSUAL BRUISING OR BLEEDING  TENDERNESS IN MOUTH AND THROAT WITH OR WITHOUT PRESENCE OF ULCERS  *URINARY PROBLEMS  *BOWEL PROBLEMS  UNUSUAL RASH Items with * indicate a potential emergency and should be followed up as soon as possible. If you have an emergency after office hours please contact your primary care physician or go to the nearest emergency department.  Please call the clinic during office hours if you have any questions or concerns.   You may also contact the Patient Navigator at [phone] should you have any questions or need assistance in obtaining follow up care.      Resources For Cancer Patients and their Caregivers ? American Cancer Society: Can assist with transportation, wigs, general needs, runs Look Good Feel Better.        1-888-227-6333 ? Cancer Care: Provides financial assistance, online support groups, medication/co-pay assistance.  1-800-813-HOPE  (4673) ? [facility] Assists [place] cancer patients and their families through emotional , educational and financial support.  336-427-4357 ? [place] DSS Where to apply for food stamps, Medicaid and utility assistance. 336-342-1394 ? RCATS: Transportation to medical appointments. 336-347-2287 ? Social Security Administration: May apply for disability if have a Stage IV cancer. 336-342-7796 1-800-772-1213 ? [place] Aging, Disability and Transit Services: Assists with nutrition, care and transit needs. [phone]         

## 2017-11-15 NOTE — Progress Notes (Signed)
Lisa Thornton tolerated Taxol infusion well without incident or complaint. Labs reviewed prior to administration. VSS upon completion of treatment. Patient complained of urgency and pain when urinating. States she took antibiotics recently for a UTI but they didn't help. Denies fevers and abdominal pain. Dr. Delton Coombes notified and per MD, we won't treat for a UTI since the urine culture 7/18 was negative. Per MD, pt instructed to drink plenty of water and cranberry juice and take AZO standard over the counter for symptom relief. Patient has an appointment to follow up with Dr. Delton Coombes next week and symptoms will be reassessed. Patient verbalizes understanding. Discharged self ambulatory in satisfactory condition.

## 2017-11-22 ENCOUNTER — Inpatient Hospital Stay (HOSPITAL_BASED_OUTPATIENT_CLINIC_OR_DEPARTMENT_OTHER): Payer: Medicaid Other | Admitting: Hematology

## 2017-11-22 ENCOUNTER — Encounter (HOSPITAL_COMMUNITY): Payer: Self-pay | Admitting: Hematology

## 2017-11-22 ENCOUNTER — Inpatient Hospital Stay (HOSPITAL_COMMUNITY): Payer: Medicaid Other

## 2017-11-22 ENCOUNTER — Other Ambulatory Visit: Payer: Self-pay

## 2017-11-22 VITALS — BP 133/67 | HR 79 | Temp 98.2°F | Resp 18 | Wt 155.2 lb

## 2017-11-22 VITALS — BP 151/70 | HR 79 | Temp 98.2°F | Resp 18

## 2017-11-22 DIAGNOSIS — R5383 Other fatigue: Secondary | ICD-10-CM

## 2017-11-22 DIAGNOSIS — C50412 Malignant neoplasm of upper-outer quadrant of left female breast: Secondary | ICD-10-CM

## 2017-11-22 DIAGNOSIS — Z5111 Encounter for antineoplastic chemotherapy: Secondary | ICD-10-CM | POA: Diagnosis not present

## 2017-11-22 DIAGNOSIS — R040 Epistaxis: Secondary | ICD-10-CM | POA: Diagnosis not present

## 2017-11-22 DIAGNOSIS — R531 Weakness: Secondary | ICD-10-CM | POA: Diagnosis not present

## 2017-11-22 DIAGNOSIS — I10 Essential (primary) hypertension: Secondary | ICD-10-CM

## 2017-11-22 DIAGNOSIS — Z170 Estrogen receptor positive status [ER+]: Principal | ICD-10-CM

## 2017-11-22 LAB — CBC WITH DIFFERENTIAL/PLATELET
BASOS ABS: 0 10*3/uL (ref 0.0–0.1)
Basophils Relative: 0 %
EOS ABS: 0.1 10*3/uL (ref 0.0–0.7)
EOS PCT: 1 %
HCT: 27.8 % — ABNORMAL LOW (ref 36.0–46.0)
Hemoglobin: 9.5 g/dL — ABNORMAL LOW (ref 12.0–15.0)
Lymphocytes Relative: 29 %
Lymphs Abs: 1 10*3/uL (ref 0.7–4.0)
MCH: 34.3 pg — ABNORMAL HIGH (ref 26.0–34.0)
MCHC: 34.2 g/dL (ref 30.0–36.0)
MCV: 100.4 fL — ABNORMAL HIGH (ref 78.0–100.0)
Monocytes Absolute: 0.3 10*3/uL (ref 0.1–1.0)
Monocytes Relative: 7 %
Neutro Abs: 2.3 10*3/uL (ref 1.7–7.7)
Neutrophils Relative %: 63 %
PLATELETS: 295 10*3/uL (ref 150–400)
RBC: 2.77 MIL/uL — AB (ref 3.87–5.11)
RDW: 13.8 % (ref 11.5–15.5)
WBC: 3.6 10*3/uL — AB (ref 4.0–10.5)

## 2017-11-22 LAB — COMPREHENSIVE METABOLIC PANEL
ALT: 16 U/L (ref 0–44)
ANION GAP: 9 (ref 5–15)
AST: 18 U/L (ref 15–41)
Albumin: 3.4 g/dL — ABNORMAL LOW (ref 3.5–5.0)
Alkaline Phosphatase: 51 U/L (ref 38–126)
BUN: 10 mg/dL (ref 8–23)
CHLORIDE: 106 mmol/L (ref 98–111)
CO2: 24 mmol/L (ref 22–32)
CREATININE: 0.9 mg/dL (ref 0.44–1.00)
Calcium: 8.8 mg/dL — ABNORMAL LOW (ref 8.9–10.3)
GFR calc Af Amer: >60 mL/min (ref >60)
GFR calc non Af Amer: >60 mL/min (ref >60)
Glucose, Bld: 137 mg/dL — ABNORMAL HIGH (ref 70–99)
POTASSIUM: 3.8 mmol/L (ref 3.5–5.1)
SODIUM: 139 mmol/L (ref 135–145)
Total Bilirubin: 0.5 mg/dL (ref 0.3–1.2)
Total Protein: 6.3 g/dL — ABNORMAL LOW (ref 6.5–8.1)

## 2017-11-22 MED ORDER — FAMOTIDINE IN NACL 20-0.9 MG/50ML-% IV SOLN
20.0000 mg | Freq: Once | INTRAVENOUS | Status: AC
  Administered 2017-11-22: 20 mg via INTRAVENOUS
  Filled 2017-11-22: qty 50

## 2017-11-22 MED ORDER — DIPHENHYDRAMINE HCL 50 MG/ML IJ SOLN
50.0000 mg | Freq: Once | INTRAMUSCULAR | Status: AC
  Administered 2017-11-22: 50 mg via INTRAVENOUS
  Filled 2017-11-22: qty 1

## 2017-11-22 MED ORDER — SODIUM CHLORIDE 0.9 % IV SOLN
20.0000 mg | Freq: Once | INTRAVENOUS | Status: AC
  Administered 2017-11-22: 20 mg via INTRAVENOUS
  Filled 2017-11-22: qty 2

## 2017-11-22 MED ORDER — SODIUM CHLORIDE 0.9 % IV SOLN
80.0000 mg/m2 | Freq: Once | INTRAVENOUS | Status: AC
  Administered 2017-11-22: 144 mg via INTRAVENOUS
  Filled 2017-11-22: qty 24

## 2017-11-22 MED ORDER — SODIUM CHLORIDE 0.9% FLUSH
10.0000 mL | INTRAVENOUS | Status: DC | PRN
  Administered 2017-11-22: 10 mL
  Filled 2017-11-22: qty 10

## 2017-11-22 MED ORDER — SODIUM CHLORIDE 0.9 % IV SOLN
Freq: Once | INTRAVENOUS | Status: AC
  Administered 2017-11-22: 10:00:00 via INTRAVENOUS

## 2017-11-22 MED ORDER — HEPARIN SOD (PORK) LOCK FLUSH 100 UNIT/ML IV SOLN
500.0000 [IU] | Freq: Once | INTRAVENOUS | Status: AC | PRN
  Administered 2017-11-22: 500 [IU]
  Filled 2017-11-22: qty 5

## 2017-11-22 NOTE — Patient Instructions (Signed)
[facility] Discharge Instructions for Patients Receiving Chemotherapy  Today you received the following chemotherapy agents taxol.     If you develop nausea and vomiting that is not controlled by your nausea medication, call the clinic.   BELOW ARE SYMPTOMS THAT SHOULD BE REPORTED IMMEDIATELY:  *FEVER GREATER THAN 100.5 F  *CHILLS WITH OR WITHOUT FEVER  NAUSEA AND VOMITING THAT IS NOT CONTROLLED WITH YOUR NAUSEA MEDICATION  *UNUSUAL SHORTNESS OF BREATH  *UNUSUAL BRUISING OR BLEEDING  TENDERNESS IN MOUTH AND THROAT WITH OR WITHOUT PRESENCE OF ULCERS  *URINARY PROBLEMS  *BOWEL PROBLEMS  UNUSUAL RASH Items with * indicate a potential emergency and should be followed up as soon as possible.  Feel free to call the clinic should you have any questions or concerns. The clinic phone number is [phone].  Please show the CHEMO ALERT CARD at check-in to the Emergency Department and triage nurse.   

## 2017-11-22 NOTE — Progress Notes (Signed)
Lisa Thornton, [place] [id number]   CLINIC:  Medical Oncology/Hematology  PCP:  Patient, No Pcp Per No address on file None   REASON FOR VISIT:  Follow-up for breast cancer of the left breast. ER+/PR-/HER2-  CURRENT THERAPY: Paclitaxel weekly started on 09/17/17  BRIEF ONCOLOGIC HISTORY:    Breast cancer of upper-outer quadrant of left female breast ([place])   06/22/2017 Initial Diagnosis    Breast cancer of upper-outer quadrant of left female breast ([place])    06/22/2017 Cancer Staging    Staging form: Breast, AJCC 8th Edition - Clinical stage from 06/22/2017: Stage IIIB (cT3, cN1, cM0, G3, ER+, PR-, HER2-) - Signed by Derek Jack, MD on 06/22/2017    06/29/2017 Imaging    CT chest showing left axillary adenopathy, 1.2 cm anterior carinal adenopathy, left subpectoral lymph node subcentimeter  06/25/2017 2D echocardiogram with ejection fraction of 55-60%    07/16/2017 -  Chemotherapy    The patient had DOXOrubicin (ADRIAMYCIN) chemo injection 110 mg, 60 mg/m2 = 110 mg, Intravenous,  Once, 4 of 4 cycles Administration: 110 mg (07/18/2017), 110 mg (08/01/2017), 110 mg (08/20/2017), 110 mg (09/03/2017) palonosetron (ALOXI) injection 0.25 mg, 0.25 mg, Intravenous,  Once, 4 of 4 cycles Administration: 0.25 mg (07/18/2017), 0.25 mg (08/01/2017), 0.25 mg (08/20/2017), 0.25 mg (09/03/2017) pegfilgrastim-cbqv (UDENYCA) injection 6 mg, 6 mg, Subcutaneous, Once, 4 of 4 cycles Administration: 6 mg (07/19/2017), 6 mg (08/02/2017), 6 mg (08/22/2017), 6 mg (09/05/2017) cyclophosphamide (CYTOXAN) 1,100 mg in sodium chloride 0.9 % 250 mL chemo infusion, 600 mg/m2 = 1,100 mg, Intravenous,  Once, 4 of 4 cycles Administration: 1,100 mg (07/18/2017), 1,100 mg (08/01/2017), 1,100 mg (08/20/2017), 1,100 mg (09/03/2017) PACLitaxel (TAXOL) 120 mg in sodium chloride 0.9 % 250 mL chemo infusion (</= 21m/m2), 64 mg/m2 = 120 mg (80 % of original dose 80 mg/m2), Intravenous,  Once, 10 of 12  cycles Dose modification: 64 mg/m2 (80 % of original dose 80 mg/m2, Cycle 5, Reason: Provider Judgment) Administration: 120 mg (09/17/2017), 144 mg (09/24/2017), 144 mg (10/03/2017), 144 mg (10/11/2017), 144 mg (10/18/2017), 144 mg (10/25/2017), 144 mg (11/01/2017), 144 mg (11/08/2017), 144 mg (11/15/2017), 144 mg (11/22/2017) fosaprepitant (EMEND) 150 mg, dexamethasone (DECADRON) 12 mg in sodium chloride 0.9 % 145 mL IVPB, , Intravenous,  Once, 4 of 4 cycles Administration:  (07/18/2017),  (08/01/2017),  (08/20/2017),  (09/03/2017)  for chemotherapy treatment.       CANCER STAGING: Cancer Staging Breast cancer of upper-outer quadrant of left female breast ([facility] Staging form: Breast, AJCC 8th Edition - Clinical stage from 06/22/2017: Stage IIIB (cT3, cN1, cM0, G3, ER+, PR-, HER2-) - Signed by KDerek Jack MD on 06/22/2017    INTERVAL HISTORY:  [name] returns for routine follow-up for breast cacner and consideration for her 10th cycle of chemotherapy. Patient is doing well with treatment. She only has numbness in her fingertips. It is stable at this time and not constant. She will let uKoreaknow if this starts to worsen and we may cut back on treatment. Patient remains fatigued through out the day in between cycles. Patient has been sick this past week. She has a sinus infection with nose bleeds. They are improving and she is feeling better. Patient is aware she only has two more cycles and she will follow up with Dr. JArnoldo Moralefor her surgery. We will see her back after her surgery.  Patient reports her appetite is 75%. She is maintaining her weight well. Her  energy level is 50%.       REVIEW OF SYSTEMS:  Review of Systems  Constitutional: Positive for fatigue.  HENT:   Positive for nosebleeds.   Neurological: Positive for extremity weakness and numbness.  All other systems reviewed and are negative.    PAST MEDICAL/SURGICAL HISTORY:  Past Medical History:  Diagnosis Date  .  Cancer [facility])    left breast cancer  . Chest pain   . Hypertension    Past Surgical History:  Procedure Laterality Date  . PORTACATH PLACEMENT Right 06/27/2017   Procedure: INSERTION PORT-A-CATH;  Surgeon: Aviva Signs, MD;  Location: [facility];  Service: General;  Laterality: Right;     SOCIAL HISTORY:  Social History   Socioeconomic History  . Marital status: Divorced    Spouse name: Not on file  . Number of children: Not on file  . Years of education: Not on file  . Highest education level: Not on file  Occupational History  . Not on file  Social Needs  . Financial resource strain: Somewhat hard  . Food insecurity:    Worry: Never true    Inability: Never true  . Transportation needs:    Medical: No    Non-medical: No  Tobacco Use  . Smoking status: Never Smoker  . Smokeless tobacco: Never Used  Substance and Sexual Activity  . Alcohol use: No  . Drug use: No  . Sexual activity: Not Currently  Lifestyle  . Physical activity:    Days per week: 3 days    Minutes per session: 10 min  . Stress: Very much  Relationships  . Social connections:    Talks on phone: More than three times a week    Gets together: More than three times a week    Attends religious service: More than 4 times per year    Active member of club or organization: Yes    Attends meetings of clubs or organizations: Never    Relationship status: Divorced  . Intimate partner violence:    Fear of current or ex partner: Patient refused    Emotionally abused: Patient refused    Physically abused: Patient refused    Forced sexual activity: Patient refused  Other Topics Concern  . Not on file  Social History Narrative  . Not on file    FAMILY HISTORY:  Family History  Problem Relation Age of Onset  . Breast cancer Mother   . Thyroid disease Mother   . Heart disease Mother   . Heart attack Father   . Heart attack Sister   . Hypertension Sister   . Heart attack Brother   . Cancer Sister   .  Stroke Brother   . Alzheimer's disease Maternal Aunt     CURRENT MEDICATIONS:  Outpatient Encounter Medications as of 11/22/2017  Medication Sig  . amLODipine (NORVASC) 5 MG tablet Take 1 tablet (5 mg total) by mouth daily.  Marland Kitchen HYDROcodone-acetaminophen (NORCO) 5-325 MG tablet Take 1 tablet by mouth every 6 (six) hours as needed for moderate pain.  . metoprolol tartrate (LOPRESSOR) 50 MG tablet Take 50 mg by mouth 2 (two) times daily.  . ondansetron (ZOFRAN) 8 MG tablet Take 1 tablet (8 mg total) by mouth 2 (two) times daily as needed. Start on the third day after chemotherapy.  . pegfilgrastim-cbqv (UDENYCA) 6 MG/0.6ML injection Inject 6 mg into the skin once. Every 2 weeks x 4 cycles  . prochlorperazine (COMPAZINE) 10 MG tablet Take 1 tablet (10 mg  total) by mouth every 6 (six) hours as needed (Nausea or vomiting).   No facility-administered encounter medications on file as of 11/22/2017.     ALLERGIES:  No Known Allergies   PHYSICAL EXAM:  ECOG Performance status: 1  Vitals:   11/22/17 0954  BP: 133/67  Pulse: 79  Resp: 18  Temp: 98.2 F (36.8 C)  SpO2: 100%   Filed Weights   11/22/17 0954  Weight: 155 lb 3.2 oz (70.4 kg)    Physical Exam  Constitutional: She is oriented to person, place, and time. She appears well-developed and well-nourished.  Cardiovascular: Normal rate, regular rhythm and normal heart sounds.  Pulmonary/Chest: Effort normal and breath sounds normal.  Neurological: She is alert and oriented to person, place, and time.  Skin: Skin is warm and dry.  Breast exam: Left breast mass 2.5 cm at 3 o'clock position lateral to the areola.  No skin changes or erythema.  No palpable adenopathy in the left axilla.   LABORATORY DATA:  I have reviewed the labs as listed.  CBC    Component Value Date/Time   WBC 3.6 (L) 11/22/2017 0831   RBC 2.77 (L) 11/22/2017 0831   HGB 9.5 (L) 11/22/2017 0831   HCT 27.8 (L) 11/22/2017 0831   PLT 295 11/22/2017 0831   MCV  100.4 (H) 11/22/2017 0831   MCH 34.3 (H) 11/22/2017 0831   MCHC 34.2 11/22/2017 0831   RDW 13.8 11/22/2017 0831   LYMPHSABS 1.0 11/22/2017 0831   MONOABS 0.3 11/22/2017 0831   EOSABS 0.1 11/22/2017 0831   BASOSABS 0.0 11/22/2017 0831   CMP Latest Ref Rng & Units 11/22/2017 11/15/2017 11/08/2017  Glucose 70 - 99 mg/dL 137(H) 133(H) 138(H)  BUN 8 - 23 mg/dL _0 Creatinine 0.44 - 1.00 mg/dL 0.90 0.93 0.83  Sodium 135 - 145 mmol/L 139 139 138  Potassium 3.5 - 5.1 mmol/L 3.8 4.1 3.9  Chloride 98 - 111 mmol/L 106 106 108  CO2 22 - 32 mmol/L _1 Calcium 8.9 - 10.3 mg/dL 8.8(L) 9.1 9.2  Total Protein 6.5 - 8.1 g/dL 6.3(L) 6.9 6.8  Total Bilirubin 0.3 - 1.2 mg/dL 0.5 0.6 0.4  Alkaline Phos 38 - 126 U/L 51 52 42  AST 15 - 41 U/L _2 ALT 0 - 44 U/L _3 ASSESSMENT & PLAN:   Breast cancer of upper-outer quadrant of left female breast ([place]) 1.  Poorly differentiated left breast cancer stage IIIb, (cT3cN1), ER positive, PR and HER-2 negative: - On 06/12/2017, left breast biopsy with sarcomatoid features, left axillary lymph node biopsy negative.  2D echo shows EF of 55-60%. - CT of the chest showed subcarinal adenopathy, PET CT scan negative for metastatic disease. -4 cycles of dose dense Adriamycin and cyclophosphamide from 07/18/2017 through 09/03/2017. - Weekly paclitaxel started on 09/17/2017.  She has completed the week 5 on 10/18/2017.  She is tolerating it very well.  No neuropathy yet. -She is tolerating her treatments very well.  She will proceed with week 8 of paclitaxel without any dose modifications. -Left breast exam shows 2.5 cm mass at 3 o'clock position, lateral to the areola.  No skin changes.  Erythema has improved.  No orange peel appearance.  No ulcers or bruising.  No adenopathy palpable in the left axilla.  No palpable right breast masses. - We reviewed her labs.  She may proceed with week 10 of paclitaxel.  She will be referred to Dr. Arnoldo Morale in 4  weeks to schedule surgery.  I will see her back in 2 months for follow-up.  2.  Hypertension: She is continuing metoprolol 50 mg twice daily and Norvasc 5 mg daily.  Blood pressure is well controlled.        Orders placed this encounter:  Orders Placed This Encounter  Procedures  . CBC with Differential/Platelet  . Comprehensive metabolic panel      Derek Jack, MD [place] [phone]

## 2017-11-22 NOTE — Progress Notes (Signed)
Patient seen by Dr. Delton Coombes with lab review.  Ok to treat today.   Patient tolerated chemotherapy with no questions asked.  Good blood return noted before and after administration of therapy.  No bruising or swelling noted at site.  Band aid applied.  VSS with discharge and left ambulatory with no s/s of distress noted.

## 2017-11-22 NOTE — Assessment & Plan Note (Signed)
1.  Poorly differentiated left breast cancer stage IIIb, (cT3cN1), ER positive, PR and HER-2 negative: - On 06/12/2017, left breast biopsy with sarcomatoid features, left axillary lymph node biopsy negative.  2D echo shows EF of 55-60%. - CT of the chest showed subcarinal adenopathy, PET CT scan negative for metastatic disease. -4 cycles of dose dense Adriamycin and cyclophosphamide from 07/18/2017 through 09/03/2017. - Weekly paclitaxel started on 09/17/2017.  She has completed the week 5 on 10/18/2017.  She is tolerating it very well.  No neuropathy yet. -She is tolerating her treatments very well.  She will proceed with week 8 of paclitaxel without any dose modifications. -Left breast exam shows 2.5 cm mass at 3 o'clock position, lateral to the areola.  No skin changes.  Erythema has improved.  No orange peel appearance.  No ulcers or bruising.  No adenopathy palpable in the left axilla.  No palpable right breast masses. - We reviewed her labs.  She may proceed with week 10 of paclitaxel.  She will be referred to Dr. Arnoldo Morale in 4 weeks to schedule surgery.  I will see her back in 2 months for follow-up.  2.  Hypertension: She is continuing metoprolol 50 mg twice daily and Norvasc 5 mg daily.  Blood pressure is well controlled.

## 2017-11-22 NOTE — Patient Instructions (Signed)
[place] at [facility] Discharge Instructions  Continue your weekly treatments until they are complete. You will see Dr. Arnoldo Morale the following week after treatment is complete to set up surgery. We want to see you back in the office 4 weeks after you surgery is completed.    Thank you for choosing [place] at [facility] to provide your oncology and hematology care.  To afford each patient quality time with our provider, please arrive at least 15 minutes before your scheduled appointment time.   If you have a lab appointment with the [facility] please come in thru the  Main Entrance and check in at the main information desk  You need to re-schedule your appointment should you arrive 10 or more minutes late.  We strive to give you quality time with our providers, and arriving late affects you and other patients whose appointments are after yours.  Also, if you no show three or more times for appointments you may be dismissed from the clinic at the providers discretion.     Again, thank you for choosing [facility].  Our hope is that these requests will decrease the amount of time that you wait before being seen by our physicians.       _____________________________________________________________  Should you have questions after your visit to [facility], please contact our office at [phone] between the hours of 8:00 a.m. and 4:30 p.m.  Voicemails left after 4:00 p.m. will not be returned until the following business day.  For prescription refill requests, have your pharmacy contact our office and allow 72 hours.    Cancer Center Support Programs:   > Cancer Support Group  2nd Tuesday of the month 1pm-2pm, Journey Room

## 2017-11-27 ENCOUNTER — Other Ambulatory Visit (HOSPITAL_COMMUNITY): Payer: Self-pay | Admitting: Hematology

## 2017-11-29 ENCOUNTER — Inpatient Hospital Stay (HOSPITAL_COMMUNITY): Payer: Medicaid Other

## 2017-11-29 VITALS — BP 145/59 | HR 76 | Temp 97.9°F | Resp 18

## 2017-11-29 DIAGNOSIS — Z170 Estrogen receptor positive status [ER+]: Principal | ICD-10-CM

## 2017-11-29 DIAGNOSIS — C50412 Malignant neoplasm of upper-outer quadrant of left female breast: Secondary | ICD-10-CM

## 2017-11-29 DIAGNOSIS — Z5111 Encounter for antineoplastic chemotherapy: Secondary | ICD-10-CM | POA: Diagnosis not present

## 2017-11-29 LAB — COMPREHENSIVE METABOLIC PANEL
ALBUMIN: 3.5 g/dL (ref 3.5–5.0)
ALT: 16 U/L (ref 0–44)
AST: 20 U/L (ref 15–41)
Alkaline Phosphatase: 51 U/L (ref 38–126)
Anion gap: 10 (ref 5–15)
BUN: 10 mg/dL (ref 8–23)
CHLORIDE: 107 mmol/L (ref 98–111)
CO2: 24 mmol/L (ref 22–32)
CREATININE: 0.93 mg/dL (ref 0.44–1.00)
Calcium: 9.6 mg/dL (ref 8.9–10.3)
GFR calc non Af Amer: >60 mL/min (ref >60)
Glucose, Bld: 139 mg/dL — ABNORMAL HIGH (ref 70–99)
Potassium: 4.2 mmol/L (ref 3.5–5.1)
SODIUM: 141 mmol/L (ref 135–145)
Total Bilirubin: 0.5 mg/dL (ref 0.3–1.2)
Total Protein: 6.7 g/dL (ref 6.5–8.1)

## 2017-11-29 LAB — CBC WITH DIFFERENTIAL/PLATELET
BASOS ABS: 0 10*3/uL (ref 0.0–0.1)
BASOS PCT: 1 %
EOS ABS: 0 10*3/uL (ref 0.0–0.7)
Eosinophils Relative: 1 %
HCT: 28.4 % — ABNORMAL LOW (ref 36.0–46.0)
Hemoglobin: 9.7 g/dL — ABNORMAL LOW (ref 12.0–15.0)
Lymphocytes Relative: 17 %
Lymphs Abs: 0.6 10*3/uL — ABNORMAL LOW (ref 0.7–4.0)
MCH: 34.4 pg — ABNORMAL HIGH (ref 26.0–34.0)
MCHC: 34.2 g/dL (ref 30.0–36.0)
MCV: 100.7 fL — ABNORMAL HIGH (ref 78.0–100.0)
Monocytes Absolute: 0.3 10*3/uL (ref 0.1–1.0)
Monocytes Relative: 7 %
Neutro Abs: 2.7 10*3/uL (ref 1.7–7.7)
Neutrophils Relative %: 74 %
PLATELETS: 258 10*3/uL (ref 150–400)
RBC: 2.82 MIL/uL — AB (ref 3.87–5.11)
RDW: 13.8 % (ref 11.5–15.5)
WBC: 3.7 10*3/uL — AB (ref 4.0–10.5)

## 2017-11-29 MED ORDER — SODIUM CHLORIDE 0.9 % IV SOLN
20.0000 mg | Freq: Once | INTRAVENOUS | Status: AC
  Administered 2017-11-29: 20 mg via INTRAVENOUS
  Filled 2017-11-29: qty 2

## 2017-11-29 MED ORDER — SODIUM CHLORIDE 0.9% FLUSH
10.0000 mL | INTRAVENOUS | Status: DC | PRN
  Administered 2017-11-29: 10 mL
  Filled 2017-11-29: qty 10

## 2017-11-29 MED ORDER — FAMOTIDINE IN NACL 20-0.9 MG/50ML-% IV SOLN
20.0000 mg | Freq: Once | INTRAVENOUS | Status: AC
  Administered 2017-11-29: 20 mg via INTRAVENOUS
  Filled 2017-11-29: qty 50

## 2017-11-29 MED ORDER — SODIUM CHLORIDE 0.9 % IV SOLN
Freq: Once | INTRAVENOUS | Status: AC
  Administered 2017-11-29: 12:00:00 via INTRAVENOUS

## 2017-11-29 MED ORDER — HEPARIN SOD (PORK) LOCK FLUSH 100 UNIT/ML IV SOLN
500.0000 [IU] | Freq: Once | INTRAVENOUS | Status: AC | PRN
  Administered 2017-11-29: 500 [IU]

## 2017-11-29 MED ORDER — DIPHENHYDRAMINE HCL 50 MG/ML IJ SOLN
50.0000 mg | Freq: Once | INTRAMUSCULAR | Status: AC
  Administered 2017-11-29: 50 mg via INTRAVENOUS
  Filled 2017-11-29: qty 1

## 2017-11-29 MED ORDER — SODIUM CHLORIDE 0.9 % IV SOLN
60.0000 mg/m2 | Freq: Once | INTRAVENOUS | Status: AC
  Administered 2017-11-29: 108 mg via INTRAVENOUS
  Filled 2017-11-29: qty 18

## 2017-11-29 NOTE — Patient Instructions (Signed)
[place] at [facility]  Discharge Instructions:  Today you received your Taxol infusion. Follow up as scheduled. Call clinic for any questions or concerns. _______________________________________________________________  Thank you for choosing [place] at [facility] to provide your oncology and hematology care.  To afford each patient quality time with our providers, please arrive at least 15 minutes before your scheduled appointment.  You need to re-schedule your appointment if you arrive 10 or more minutes late.  We strive to give you quality time with our providers, and arriving late affects you and other patients whose appointments are after yours.  Also, if you no show three or more times for appointments you may be dismissed from the clinic.  Again, thank you for choosing [place] at [facility] hope is that these requests will allow you access to exceptional care and in a timely manner. _______________________________________________________________  If you have questions after your visit, please contact our office at [phone] between the hours of 8:30 a.m. and 5:00 p.m. Voicemails left after 4:30 p.m. will not be returned until the following business day. _______________________________________________________________  For prescription refill requests, have your pharmacy contact our office. _______________________________________________________________  Recommendations made by the consultant and any test results will be sent to your referring physician. _______________________________________________________________

## 2017-11-29 NOTE — Progress Notes (Signed)
[place] reviewed with Dr. Delton Coombes. Also made aware that patients neuropathy is worse this week. She states it is only in her fingertips but that it is constant and never goes away. Per Dr. Delton Coombes, he will dose reduce Taxol and patient is ok for treatment today. Aleeya Veitch Rijos tolerated Taxol infusion without incident or complaint. VSS upon completion of treatment. Discharged self ambulatory in satisfactory condition.

## 2017-12-04 ENCOUNTER — Other Ambulatory Visit (HOSPITAL_COMMUNITY): Payer: Self-pay | Admitting: Hematology

## 2017-12-05 ENCOUNTER — Other Ambulatory Visit (HOSPITAL_COMMUNITY): Payer: Self-pay | Admitting: Nurse Practitioner

## 2017-12-06 ENCOUNTER — Inpatient Hospital Stay (HOSPITAL_COMMUNITY): Payer: Medicaid Other | Attending: Hematology

## 2017-12-06 ENCOUNTER — Inpatient Hospital Stay (HOSPITAL_COMMUNITY): Payer: Medicaid Other

## 2017-12-06 VITALS — BP 167/87 | HR 83 | Temp 98.0°F | Resp 18 | Wt 157.0 lb

## 2017-12-06 DIAGNOSIS — C50412 Malignant neoplasm of upper-outer quadrant of left female breast: Secondary | ICD-10-CM | POA: Diagnosis not present

## 2017-12-06 DIAGNOSIS — Z5111 Encounter for antineoplastic chemotherapy: Secondary | ICD-10-CM | POA: Diagnosis present

## 2017-12-06 DIAGNOSIS — Z170 Estrogen receptor positive status [ER+]: Secondary | ICD-10-CM | POA: Insufficient documentation

## 2017-12-06 LAB — CBC WITH DIFFERENTIAL/PLATELET
BASOS ABS: 0 10*3/uL (ref 0.0–0.1)
BASOS PCT: 0 %
EOS PCT: 1 %
Eosinophils Absolute: 0 10*3/uL (ref 0.0–0.7)
HCT: 29.4 % — ABNORMAL LOW (ref 36.0–46.0)
Hemoglobin: 10 g/dL — ABNORMAL LOW (ref 12.0–15.0)
Lymphocytes Relative: 21 %
Lymphs Abs: 0.8 10*3/uL (ref 0.7–4.0)
MCH: 34.1 pg — ABNORMAL HIGH (ref 26.0–34.0)
MCHC: 34 g/dL (ref 30.0–36.0)
MCV: 100.3 fL — AB (ref 78.0–100.0)
MONO ABS: 0.3 10*3/uL (ref 0.1–1.0)
MONOS PCT: 9 %
Neutro Abs: 2.6 10*3/uL (ref 1.7–7.7)
Neutrophils Relative %: 69 %
PLATELETS: 254 10*3/uL (ref 150–400)
RBC: 2.93 MIL/uL — ABNORMAL LOW (ref 3.87–5.11)
RDW: 13.7 % (ref 11.5–15.5)
WBC: 3.8 10*3/uL — ABNORMAL LOW (ref 4.0–10.5)

## 2017-12-06 LAB — COMPREHENSIVE METABOLIC PANEL
ALBUMIN: 3.6 g/dL (ref 3.5–5.0)
ALT: 19 U/L (ref 0–44)
AST: 23 U/L (ref 15–41)
Alkaline Phosphatase: 51 U/L (ref 38–126)
Anion gap: 8 (ref 5–15)
BILIRUBIN TOTAL: 0.4 mg/dL (ref 0.3–1.2)
BUN: 6 mg/dL — AB (ref 8–23)
CO2: 26 mmol/L (ref 22–32)
Calcium: 9 mg/dL (ref 8.9–10.3)
Chloride: 107 mmol/L (ref 98–111)
Creatinine, Ser: 0.95 mg/dL (ref 0.44–1.00)
GFR calc Af Amer: >60 mL/min (ref >60)
Glucose, Bld: 158 mg/dL — ABNORMAL HIGH (ref 70–99)
POTASSIUM: 4.3 mmol/L (ref 3.5–5.1)
Sodium: 141 mmol/L (ref 135–145)
TOTAL PROTEIN: 6.6 g/dL (ref 6.5–8.1)

## 2017-12-06 MED ORDER — FAMOTIDINE IN NACL 20-0.9 MG/50ML-% IV SOLN
20.0000 mg | Freq: Once | INTRAVENOUS | Status: AC
  Administered 2017-12-06: 20 mg via INTRAVENOUS

## 2017-12-06 MED ORDER — DIPHENHYDRAMINE HCL 50 MG/ML IJ SOLN
50.0000 mg | Freq: Once | INTRAMUSCULAR | Status: AC
  Administered 2017-12-06: 50 mg via INTRAVENOUS

## 2017-12-06 MED ORDER — SODIUM CHLORIDE 0.9% FLUSH
10.0000 mL | INTRAVENOUS | Status: DC | PRN
  Administered 2017-12-06: 10 mL
  Filled 2017-12-06: qty 10

## 2017-12-06 MED ORDER — SODIUM CHLORIDE 0.9 % IV SOLN
20.0000 mg | Freq: Once | INTRAVENOUS | Status: AC
  Administered 2017-12-06: 20 mg via INTRAVENOUS
  Filled 2017-12-06: qty 2

## 2017-12-06 MED ORDER — HEPARIN SOD (PORK) LOCK FLUSH 100 UNIT/ML IV SOLN
500.0000 [IU] | Freq: Once | INTRAVENOUS | Status: AC | PRN
  Administered 2017-12-06: 500 [IU]

## 2017-12-06 MED ORDER — SODIUM CHLORIDE 0.9 % IV SOLN
Freq: Once | INTRAVENOUS | Status: AC
  Administered 2017-12-06: 12:00:00 via INTRAVENOUS

## 2017-12-06 MED ORDER — DIPHENHYDRAMINE HCL 50 MG/ML IJ SOLN
INTRAMUSCULAR | Status: AC
  Filled 2017-12-06: qty 1

## 2017-12-06 MED ORDER — FAMOTIDINE IN NACL 20-0.9 MG/50ML-% IV SOLN
INTRAVENOUS | Status: AC
  Filled 2017-12-06: qty 50

## 2017-12-06 MED ORDER — SODIUM CHLORIDE 0.9 % IV SOLN
40.0000 mg/m2 | Freq: Once | INTRAVENOUS | Status: AC
  Administered 2017-12-06: 72 mg via INTRAVENOUS
  Filled 2017-12-06: qty 12

## 2017-12-06 NOTE — Progress Notes (Signed)
Lisa Thornton tolerated Taxol infusion without incident or complaint. VSS upon completion of treatment. Discharged self ambulatory in satisfactory condition.

## 2017-12-06 NOTE — Patient Instructions (Signed)
[place] at [facility]  Discharge Instructions:  Today you received Taxol infusion. Follow up as scheduled. Call clinic for any questions or concerns.  _______________________________________________________________  Thank you for choosing [place] at [facility] to provide your oncology and hematology care.  To afford each patient quality time with our providers, please arrive at least 15 minutes before your scheduled appointment.  You need to re-schedule your appointment if you arrive 10 or more minutes late.  We strive to give you quality time with our providers, and arriving late affects you and other patients whose appointments are after yours.  Also, if you no show three or more times for appointments you may be dismissed from the clinic.  Again, thank you for choosing [place] at [facility] hope is that these requests will allow you access to exceptional care and in a timely manner. _______________________________________________________________  If you have questions after your visit, please contact our office at [phone] between the hours of 8:30 a.m. and 5:00 p.m. Voicemails left after 4:30 p.m. will not be returned until the following business day. _______________________________________________________________  For prescription refill requests, have your pharmacy contact our office. _______________________________________________________________  Recommendations made by the consultant and any test results will be sent to your referring physician. _______________________________________________________________

## 2017-12-06 NOTE — Progress Notes (Signed)
Lab results and c/o increased neuropathy in fingers reviewed with Dr. Delton Coombes and pt approved for Taxol infusion with decrease in dosage for this last infusion per MD

## 2017-12-13 ENCOUNTER — Other Ambulatory Visit (HOSPITAL_COMMUNITY): Payer: Medicaid Other

## 2017-12-13 ENCOUNTER — Ambulatory Visit (HOSPITAL_COMMUNITY): Payer: Medicaid Other

## 2017-12-18 ENCOUNTER — Ambulatory Visit (INDEPENDENT_AMBULATORY_CARE_PROVIDER_SITE_OTHER): Payer: Medicaid Other | Admitting: General Surgery

## 2017-12-18 ENCOUNTER — Encounter: Payer: Self-pay | Admitting: General Surgery

## 2017-12-18 VITALS — BP 159/72 | HR 89 | Temp 98.7°F | Resp 16 | Wt 158.0 lb

## 2017-12-18 DIAGNOSIS — C50112 Malignant neoplasm of central portion of left female breast: Secondary | ICD-10-CM | POA: Diagnosis not present

## 2017-12-18 NOTE — Patient Instructions (Signed)
Total or Modified Radical Mastectomy, Care After Refer to this sheet in the next few weeks. These instructions provide you with information about caring for yourself after your procedure. Your health care provider may also give you more specific instructions. Your treatment has been planned according to current medical practices, but problems sometimes occur. Call your health care provider if you have any problems or questions after your procedure. What can I expect after the procedure? After your procedure, it is common to have:  Pain.  Numbness.  Stiffness in your arm or shoulder.  Feelings of stress, sadness, or depression.  If the lymph nodes under your arm were removed, you may have arm swelling, weakness, or numbness on the same side of your body as your surgery. Follow these instructions at home: Incision care  There are many different ways to close and cover an incision, including stitches, skin glue, and adhesive strips. Follow your health care provider's instructions about: ? Incision care. ? Bandage (dressing) changes and removal. ? Incision closure removal.  Check your incision area every day for signs of infection. Watch for: ? Redness, swelling, or pain. ? Fluid, blood, or pus.  If you were sent home with a surgical drain in place, follow your health care provider's instructions for emptying it. Bathing  Do not take baths, swim, or use a hot tub until your health care provider approves.  Take sponge baths until your health care provider says that you can start showering or bathing. Activity  Return to your normal activities as directed by your health care provider.  Avoid strenuous exercise.  Be careful to avoid any activities that could cause an injury to your arm on the side of your surgery.  Do not lift anything that is heavier than 10 lb (4.5 kg). Avoid lifting with the arm that is on the side of your surgery.  Do not carry heavy objects on your  shoulder.  After your drain is removed, you should perform exercises to keep your arm from getting stiff and swollen. Talk with your health care provider about which exercises are safe for you. General instructions  Take medicines only as directed by your health care provider.  You may eat what you usually do.  Keep your arm elevated when at rest.  Do not wear tight jewelry on your arm, wrist, or fingers on the side of your surgery.  Get checked for extra fluid around your lymph nodes (lymphedema) as often as told by your health care provider.  If you had a modified radical mastectomy, always let your health care providers know that lymph nodes under your arm were removed. This is important information to share before you are involved in certain procedures, such as giving blood or having your blood pressure taken. Contact a health care provider if:  You have a fever.  Your pain medicine is not working.  Your arm swelling, weakness, or numbness has not improved after a few weeks.  You have new swelling in your breast or arm.  You have redness, swelling, or pain in your incision area.  You have fluid, blood, or pus coming from your incision. Get help right away if:  You have very bad pain in your breast or arm.  You have chest pain.  You have difficulty breathing. This information is not intended to replace advice given to you by your health care provider. Make sure you discuss any questions you have with your health care provider. Document Released: 11/11/2003 Document Revised: 11/25/2015  Document Reviewed: 12/03/2013 Elsevier Interactive Patient Education  [employer].

## 2017-12-18 NOTE — Progress Notes (Signed)
Subjective:     Lisa Thornton  Patient is referred back to my care by oncology for a left modified radical mastectomy.  She has received neoadjuvant chemotherapy. Objective:    BP (!) 159/72 (BP Location: Left Arm, Patient Position: Sitting, Cuff Size: Normal)   Pulse 89   Temp 98.7 F (37.1 C) (Temporal)   Resp 16   Wt 158 lb (71.7 kg)   BMI 27.12 kg/m   General:  alert, cooperative and no distress  Lungs clear to auscultation with equal breath sounds bilaterally Heart examination reveals regular rate and rhythm without S3, S4, murmurs Left breast examination reveals a dominant mass in the left retroareolar region and to the left.  It has decreased in size since I last saw her. Oncology notes reviewed     Assessment:    Left breast carcinoma, finished with neoadjuvant therapy    Plan:   Patient is scheduled for a left modified radical mastectomy on 12/31/2017.  The risks and benefits of the procedure including bleeding, infection, nerve injury, and left arm swelling were fully explained to the patient, who gave informed consent.

## 2017-12-18 NOTE — H&P (Signed)
Lisa Thornton; [id number]; [birth year]   HPI Patient is a 62 year old white female who was referred to my care by the [facility] for evaluation and treatment of the left breast mass.  She under went recent core biopsy of the left breast which revealed poorly differentiated adenocarcinoma.  She is ER positive, PR negative, HER-2 negative.  She also had a biopsy of a left axillary lymph node which was negative.  Patient states her mother was treated for breast cancer in her 3s.  She noticed the mass in the left breast in December 2018, and has noted a significant increase in size over that time.  She denies any nipple discharge or breast pain. Patient underwent neoadjuvant therapy and is now referred back to my care for a left modified radical mastectomy. Past Medical History:  Diagnosis Date  . Chest pain     History reviewed. No pertinent surgical history.  Family History  Problem Relation Age of Onset  . Breast cancer Mother     Current Outpatient Medications on File Prior to Visit  Medication Sig Dispense Refill  . famotidine (PEPCID) 20 MG tablet Take 1 tablet (20 mg total) by mouth 2 (two) times daily. 20 tablet 0  . predniSONE (DELTASONE) 20 MG tablet Take 3 po QD x 3d , then 2 po QD x 3d then 1 po QD x 3d 18 tablet 0   No current facility-administered medications on file prior to visit.     No Known Allergies  Social History   Substance and Sexual Activity  Alcohol Use No    Social History   Tobacco Use  Smoking Status Never Smoker  Smokeless Tobacco Never Used    Review of Systems  Constitutional: Negative.   HENT: Negative.   Eyes: Negative.   Respiratory: Negative.   Cardiovascular: Negative.   Gastrointestinal: Positive for heartburn.  Genitourinary: Negative.   Musculoskeletal: Negative.   Skin: Negative.   Neurological: Negative.   Endo/Heme/Allergies: Negative.   Psychiatric/Behavioral: Negative.     Objective   Vitals:   06/19/17 1406   BP: (!) 186/91  Pulse: 79  Temp: 98.2 F (36.8 C)    Physical Exam  Constitutional: She is oriented to person, place, and time and well-developed, well-nourished, and in no distress.  HENT:  Head: Normocephalic and atraumatic.  Neck: Normal range of motion. Neck supple.  Cardiovascular: Normal rate, regular rhythm and normal heart sounds. Exam reveals no gallop and no friction rub.  No murmur heard. Pulmonary/Chest: Effort normal and breath sounds normal. No respiratory distress. She has no wheezes. She has no rales.  Lymphadenopathy:    She has no cervical adenopathy.  Neurological: She is alert and oriented to person, place, and time.  Skin: Skin is warm and dry.  Vitals reviewed. Breast: Large 5 cm mass involving the left areola and 3 o'clock position of the left breast with thin erythematous skin overlying it.  I do not palpate any significant lymphadenopathy in the left breast.  Some nipple retraction noted.  Right breast examination reveals no dominant mass, nipple discharge, dimpling.  The axilla is negative for palpable nodes.  Mammogram, ultrasound, and pathology reports all reviewed  Assessment  Invasive ductal carcinoma of left breast, finished with neoadjuvant chemotherapy Plan   Patient is scheduled for a left modified radical mastectomy on 12/31/2017.  The risks and benefits of the procedure including bleeding, infection, nerve injury, and left arm swelling were fully explained to the patient, who gave informed consent.

## 2017-12-20 ENCOUNTER — Other Ambulatory Visit (HOSPITAL_COMMUNITY): Payer: Medicaid Other

## 2017-12-20 ENCOUNTER — Ambulatory Visit (HOSPITAL_COMMUNITY): Payer: Medicaid Other

## 2017-12-21 NOTE — Patient Instructions (Signed)
Lisa Thornton  12/21/2017     @PREFPERIOPPHARMACY @   Your procedure is scheduled on  12/31/2017 .  Report to [facility] at  615   A.M.  Call this number if you have problems the morning of surgery:  [phone]   Remember:  Do not eat or drink after midnight.  You may drink clear liquids until  12 midnight 12/30/2017 .  Clear liquids allowed are:                    Water, Juice (non-citric and without pulp), Carbonated beverages, Clear Tea, Black Coffee only, Plain Jell-O only, Gatorade and Plain Popsicles only    Take these medicines the morning of surgery with A SIP OF WATER  Amlodipine, hydrocodone( if needed), metoprolol, zofran or compazine ( if needed).    Do not wear jewelry, make-up or nail polish.  Do not wear lotions, powders, or perfumes, or deodorant.  Do not shave 48 hours prior to surgery.  Men may shave face and neck.  Do not bring valuables to the hospital.  [facility] is not responsible for any belongings or valuables.  Contacts, dentures or bridgework may not be worn into surgery.  Leave your suitcase in the car.  After surgery it may be brought to your room.  For patients admitted to the hospital, discharge time will be determined by your treatment team.  Patients discharged the day of surgery will not be allowed to drive home.   Name and phone number of your driver:   family Special instructions:  None  Please read over the following fact sheets that you were given. Pain Booklet, Coughing and Deep Breathing, Blood Transfusion Information, MRSA Information, Surgical Site Infection Prevention and Anesthesia Post-op Instructions      Total or Modified Radical Mastectomy A total mastectomy and a modified radical mastectomy are types of surgery for breast cancer. If you are having a total mastectomy (simple mastectomy), your entire breast will be removed. If you are having a modified radical mastectomy, your breast and nipple will be removed  along with the lymph nodes under your arm. You may also have some of the lining over the muscle tissues under your breast removed. Let your health care provider know about:  Any allergies you have.  All medicines you are taking, including vitamins, herbs, eye drops, creams, and over-the-counter medicines.  Previous problems you or members of your family have had with the use of anesthetics.  Any blood disorders you have.  Any surgeries you have had.  Any medical conditions you have. What are the risks? Generally, this is a safe procedure. However, problems may occur, including:  Pain.  Infection.  Bleeding.  Scar tissue.  Chest numbness on the side of the surgery.  Fluid buildup under the skin flaps where your breast was removed (seroma).  Sensation of throbbing or tingling.  Stress or sadness from losing your breast.  If you have the lymph nodes under your arm removed, you may have arm swelling, weakness, or numbness on the same side of your body as your surgery. What happens before the procedure?  Ask your health care provider about: ? Changing or stopping your regular medicines. This is especially important if you are taking diabetes medicines or blood thinners. ? Taking medicines such as aspirin and ibuprofen. These medicines can thin your blood. Do not take these medicines before your procedure if your health care provider  instructs you not to.  Follow your health care provider's instructions about eating or drinking restrictions.  You may be checked for extra fluid around your lymph nodes (lymphedema).  Plan to have someone take you home after the procedure. What happens during the procedure?  An IV tube will be inserted into one of your veins.  You will be given a medicine that makes you fall asleep (general anesthetic).  Your breast will be cleaned with a germ-killing solution (antiseptic).  A wide incision will be made around your nipple. The skin and  nipple inside the incision will be removed along with all breast tissue.  If you are having a modified radical mastectomy: ? The lining over your chest muscles will be removed. ? The incision may be extended to reach the lymph nodes under your arm, or a second incision may be made. ? The lymph nodes will be removed.  You may have a drainage tube inserted into your incision to collect fluid that builds up after surgery. This tube is connected to a suction bulb.  Your incision or incisions will be closed with stitches (sutures).  A bandage (dressing) will be placed over your breast and under your arm. The procedure may vary among health care providers and hospitals. What happens after the procedure?  You will be moved to a recovery area.  Your blood pressure, heart rate, breathing rate, and blood oxygen level will be monitored often until the medicines you were given have worn off.  You will be given pain medicine as needed.  After a while, you will be taken to a hospital room.  You will be encouraged to get up and walk as soon as you can.  Your IV tube can be removed when you are able to eat and drink.  Your drain may be removed before you go home from the hospital, or you may be sent home with your drain and suction bulb. This information is not intended to replace advice given to you by your health care provider. Make sure you discuss any questions you have with your health care provider. Document Released: 12/13/2000 Document Revised: 11/25/2015 Document Reviewed: 12/03/2013 Elsevier Interactive Patient Education  2018 [place]. Total or Modified Radical Mastectomy, Care After Refer to this sheet in the next few weeks. These instructions provide you with information about caring for yourself after your procedure. Your health care provider may also give you more specific instructions. Your treatment has been planned according to current medical practices, but problems sometimes  occur. Call your health care provider if you have any problems or questions after your procedure. What can I expect after the procedure? After your procedure, it is common to have:  Pain.  Numbness.  Stiffness in your arm or shoulder.  Feelings of stress, sadness, or depression.  If the lymph nodes under your arm were removed, you may have arm swelling, weakness, or numbness on the same side of your body as your surgery. Follow these instructions at home: Incision care  There are many different ways to close and cover an incision, including stitches, skin glue, and adhesive strips. Follow your health care provider's instructions about: ? Incision care. ? Bandage (dressing) changes and removal. ? Incision closure removal.  Check your incision area every day for signs of infection. Watch for: ? Redness, swelling, or pain. ? Fluid, blood, or pus.  If you were sent home with a surgical drain in place, follow your health care provider's instructions for emptying it. Bathing  Do not take baths, swim, or use a hot tub until your health care provider approves.  Take sponge baths until your health care provider says that you can start showering or bathing. Activity  Return to your normal activities as directed by your health care provider.  Avoid strenuous exercise.  Be careful to avoid any activities that could cause an injury to your arm on the side of your surgery.  Do not lift anything that is heavier than 10 lb (4.5 kg). Avoid lifting with the arm that is on the side of your surgery.  Do not carry heavy objects on your shoulder.  After your drain is removed, you should perform exercises to keep your arm from getting stiff and swollen. Talk with your health care provider about which exercises are safe for you. General instructions  Take medicines only as directed by your health care provider.  You may eat what you usually do.  Keep your arm elevated when at rest.  Do  not wear tight jewelry on your arm, wrist, or fingers on the side of your surgery.  Get checked for extra fluid around your lymph nodes (lymphedema) as often as told by your health care provider.  If you had a modified radical mastectomy, always let your health care providers know that lymph nodes under your arm were removed. This is important information to share before you are involved in certain procedures, such as giving blood or having your blood pressure taken. Contact a health care provider if:  You have a fever.  Your pain medicine is not working.  Your arm swelling, weakness, or numbness has not improved after a few weeks.  You have new swelling in your breast or arm.  You have redness, swelling, or pain in your incision area.  You have fluid, blood, or pus coming from your incision. Get help right away if:  You have very bad pain in your breast or arm.  You have chest pain.  You have difficulty breathing. This information is not intended to replace advice given to you by your health care provider. Make sure you discuss any questions you have with your health care provider. Document Released: 11/11/2003 Document Revised: 11/25/2015 Document Reviewed: 12/03/2013 Elsevier Interactive Patient Education  2018 [place] Anesthesia, Adult General anesthesia is the use of medicines to make a person "go to sleep" (be unconscious) for a medical procedure. General anesthesia is often recommended when a procedure:  Is long.  Requires you to be still or in an unusual position.  Is major and can cause you to lose blood.  Is impossible to do without general anesthesia.  The medicines used for general anesthesia are called general anesthetics. In addition to making you sleep, the medicines:  Prevent pain.  Control your blood pressure.  Relax your muscles.  Tell a health care provider about:  Any allergies you have.  All medicines you are taking, including  vitamins, herbs, eye drops, creams, and over-the-counter medicines.  Any problems you or family members have had with anesthetic medicines.  Types of anesthetics you have had in the past.  Any bleeding disorders you have.  Any surgeries you have had.  Any medical conditions you have.  Any history of heart or lung conditions, such as heart failure, sleep apnea, or chronic obstructive pulmonary disease (COPD).  Whether you are pregnant or may be pregnant.  Whether you use tobacco, alcohol, marijuana, or street drugs.  Any history of Armed forces logistics/support/administrative officer.  Any history  of depression or anxiety. What are the risks? Generally, this is a safe procedure. However, problems may occur, including:  Allergic reaction to anesthetics.  Lung and heart problems.  Inhaling food or liquids from your stomach into your lungs (aspiration).  Injury to nerves.  Waking up during your procedure and being unable to move (rare).  Extreme agitation or a state of mental confusion (delirium) when you wake up from the anesthetic.  Air in the bloodstream, which can lead to stroke.  These problems are more likely to develop if you are having a major surgery or if you have an advanced medical condition. You can prevent some of these complications by answering all of your health care provider's questions thoroughly and by following all pre-procedure instructions. General anesthesia can cause side effects, including:  Nausea or vomiting  A sore throat from the breathing tube.  Feeling cold or shivery.  Feeling tired, washed out, or achy.  Sleepiness or drowsiness.  Confusion or agitation.  What happens before the procedure? Staying hydrated Follow instructions from your health care provider about hydration, which may include:  Up to 2 hours before the procedure - you may continue to drink clear liquids, such as water, clear fruit juice, black coffee, and plain tea.  Eating and drinking  restrictions Follow instructions from your health care provider about eating and drinking, which may include:  8 hours before the procedure - stop eating heavy meals or foods such as meat, fried foods, or fatty foods.  6 hours before the procedure - stop eating light meals or foods, such as toast or cereal.  6 hours before the procedure - stop drinking milk or drinks that contain milk.  2 hours before the procedure - stop drinking clear liquids.  Medicines  Ask your health care provider about: ? Changing or stopping your regular medicines. This is especially important if you are taking diabetes medicines or blood thinners. ? Taking medicines such as aspirin and ibuprofen. These medicines can thin your blood. Do not take these medicines before your procedure if your health care provider instructs you not to. ? Taking new dietary supplements or medicines. Do not take these during the week before your procedure unless your health care provider approves them.  If you are told to take a medicine or to continue taking a medicine on the day of the procedure, take the medicine with sips of water. General instructions   Ask if you will be going home the same day, the following day, or after a longer hospital stay. ? Plan to have someone take you home. ? Plan to have someone stay with you for the first 24 hours after you leave the hospital or clinic.  For 3-6 weeks before the procedure, try not to use any tobacco products, such as cigarettes, chewing tobacco, and e-cigarettes.  You may brush your teeth on the morning of the procedure, but make sure to spit out the toothpaste. What happens during the procedure?  You will be given anesthetics through a mask and through an IV tube in one of your veins.  You may receive medicine to help you relax (sedative).  As soon as you are asleep, a breathing tube may be used to help you breathe.  An anesthesia specialist will stay with you throughout the  procedure. He or she will help keep you comfortable and safe by continuing to give you medicines and adjusting the amount of medicine that you get. He or she will also watch your  blood pressure, pulse, and oxygen levels to make sure that the anesthetics do not cause any problems.  If a breathing tube was used to help you breathe, it will be removed before you wake up. The procedure may vary among health care providers and hospitals. What happens after the procedure?  You will wake up, often slowly, after the procedure is complete, usually in a recovery area.  Your blood pressure, heart rate, breathing rate, and blood oxygen level will be monitored until the medicines you were given have worn off.  You may be given medicine to help you calm down if you feel anxious or agitated.  If you will be going home the same day, your health care provider may check to make sure you can stand, drink, and urinate.  Your health care providers will treat your pain and side effects before you go home.  Do not drive for 24 hours if you received a sedative.  You may: ? Feel nauseous and vomit. ? Have a sore throat. ? Have mental slowness. ? Feel cold or shivery. ? Feel sleepy. ? Feel tired. ? Feel sore or achy, even in parts of your body where you did not have surgery. This information is not intended to replace advice given to you by your health care provider. Make sure you discuss any questions you have with your health care provider. Document Released: 06/27/2007 Document Revised: 08/31/2015 Document Reviewed: 03/04/2015 Elsevier Interactive Patient Education  2018 [place] Anesthesia, Adult, Care After These instructions provide you with information about caring for yourself after your procedure. Your health care provider may also give you more specific instructions. Your treatment has been planned according to current medical practices, but problems sometimes occur. Call your health care  provider if you have any problems or questions after your procedure. What can I expect after the procedure? After the procedure, it is common to have:  Vomiting.  A sore throat.  Mental slowness.  It is common to feel:  Nauseous.  Cold or shivery.  Sleepy.  Tired.  Sore or achy, even in parts of your body where you did not have surgery.  Follow these instructions at home: For at least 24 hours after the procedure:  Do not: ? Participate in activities where you could fall or become injured. ? Drive. ? Use heavy machinery. ? Drink alcohol. ? Take sleeping pills or medicines that cause drowsiness. ? Make important decisions or sign legal documents. ? Take care of children on your own.  Rest. Eating and drinking  If you vomit, drink water, juice, or soup when you can drink without vomiting.  Drink enough fluid to keep your urine clear or pale yellow.  Make sure you have little or no nausea before eating solid foods.  Follow the diet recommended by your health care provider. General instructions  Have a responsible adult stay with you until you are awake and alert.  Return to your normal activities as told by your health care provider. Ask your health care provider what activities are safe for you.  Take over-the-counter and prescription medicines only as told by your health care provider.  If you smoke, do not smoke without supervision.  Keep all follow-up visits as told by your health care provider. This is important. Contact a health care provider if:  You continue to have nausea or vomiting at home, and medicines are not helpful.  You cannot drink fluids or start eating again.  You cannot urinate after 8-12 hours.  You develop a skin rash.  You have fever.  You have increasing redness at the site of your procedure. Get help right away if:  You have difficulty breathing.  You have chest pain.  You have unexpected bleeding.  You feel that you  are having a life-threatening or urgent problem. This information is not intended to replace advice given to you by your health care provider. Make sure you discuss any questions you have with your health care provider. Document Released: 06/26/2000 Document Revised: 08/23/2015 Document Reviewed: 03/04/2015 Elsevier Interactive Patient Education  [employer].

## 2017-12-25 ENCOUNTER — Other Ambulatory Visit: Payer: Self-pay

## 2017-12-25 ENCOUNTER — Encounter (HOSPITAL_COMMUNITY): Payer: Self-pay

## 2017-12-25 ENCOUNTER — Encounter (HOSPITAL_COMMUNITY)
Admission: RE | Admit: 2017-12-25 | Discharge: 2017-12-25 | Disposition: A | Discharge status: Home / Self Care | Payer: Medicaid Other | Source: Ambulatory Visit | Attending: General Surgery | Admitting: General Surgery

## 2017-12-25 DIAGNOSIS — Z01812 Encounter for preprocedural laboratory examination: Secondary | ICD-10-CM | POA: Insufficient documentation

## 2017-12-25 LAB — BASIC METABOLIC PANEL
Anion gap: 8 (ref 5–15)
BUN: 7 mg/dL — AB (ref 8–23)
CO2: 21 mmol/L — ABNORMAL LOW (ref 22–32)
Calcium: 9.3 mg/dL (ref 8.9–10.3)
Chloride: 110 mmol/L (ref 98–111)
Creatinine, Ser: 0.72 mg/dL (ref 0.44–1.00)
GFR calc Af Amer: >60 mL/min (ref >60)
GLUCOSE: 106 mg/dL — AB (ref 70–99)
POTASSIUM: 3.7 mmol/L (ref 3.5–5.1)
Sodium: 139 mmol/L (ref 135–145)

## 2017-12-25 LAB — CBC WITH DIFFERENTIAL/PLATELET
Basophils Absolute: 0 10*3/uL (ref 0.0–0.1)
Basophils Relative: 0 %
EOS PCT: 4 %
Eosinophils Absolute: 0.3 10*3/uL (ref 0.0–0.7)
HCT: 31.2 % — ABNORMAL LOW (ref 36.0–46.0)
Hemoglobin: 10.6 g/dL — ABNORMAL LOW (ref 12.0–15.0)
LYMPHS ABS: 1.1 10*3/uL (ref 0.7–4.0)
LYMPHS PCT: 16 %
MCH: 33.3 pg (ref 26.0–34.0)
MCHC: 34 g/dL (ref 30.0–36.0)
MCV: 98.1 fL (ref 78.0–100.0)
MONO ABS: 0.5 10*3/uL (ref 0.1–1.0)
MONOS PCT: 7 %
Neutro Abs: 4.9 10*3/uL (ref 1.7–7.7)
Neutrophils Relative %: 73 %
PLATELETS: 259 10*3/uL (ref 150–400)
RBC: 3.18 MIL/uL — ABNORMAL LOW (ref 3.87–5.11)
RDW: 13.3 % (ref 11.5–15.5)
WBC: 6.7 10*3/uL (ref 4.0–10.5)

## 2017-12-26 NOTE — Pre-Procedure Instructions (Signed)
Dr Arnoldo Morale notified of H&H. No orders given.

## 2017-12-27 ENCOUNTER — Other Ambulatory Visit (HOSPITAL_COMMUNITY): Payer: Medicaid Other

## 2017-12-27 ENCOUNTER — Ambulatory Visit (HOSPITAL_COMMUNITY): Payer: Medicaid Other

## 2017-12-31 ENCOUNTER — Ambulatory Visit (HOSPITAL_COMMUNITY): Payer: Medicaid Other | Admitting: Anesthesiology

## 2017-12-31 ENCOUNTER — Observation Stay (HOSPITAL_COMMUNITY)
Admission: RE | Admit: 2017-12-31 | Discharge: 2018-01-01 | Disposition: A | Discharge status: Home Health | Payer: Medicaid Other | Source: Ambulatory Visit | Attending: General Surgery | Admitting: General Surgery

## 2017-12-31 ENCOUNTER — Encounter (HOSPITAL_COMMUNITY)
Admission: RE | Disposition: A | Discharge status: Home Health | Payer: Self-pay | Source: Ambulatory Visit | Attending: General Surgery

## 2017-12-31 ENCOUNTER — Encounter (HOSPITAL_COMMUNITY): Payer: Self-pay | Admitting: *Deleted

## 2017-12-31 ENCOUNTER — Other Ambulatory Visit: Payer: Self-pay

## 2017-12-31 DIAGNOSIS — Z809 Family history of malignant neoplasm, unspecified: Secondary | ICD-10-CM | POA: Insufficient documentation

## 2017-12-31 DIAGNOSIS — I4891 Unspecified atrial fibrillation: Secondary | ICD-10-CM | POA: Diagnosis not present

## 2017-12-31 DIAGNOSIS — Z170 Estrogen receptor positive status [ER+]: Secondary | ICD-10-CM | POA: Insufficient documentation

## 2017-12-31 DIAGNOSIS — Z9012 Acquired absence of left breast and nipple: Secondary | ICD-10-CM

## 2017-12-31 DIAGNOSIS — D649 Anemia, unspecified: Secondary | ICD-10-CM | POA: Insufficient documentation

## 2017-12-31 DIAGNOSIS — C50112 Malignant neoplasm of central portion of left female breast: Secondary | ICD-10-CM

## 2017-12-31 DIAGNOSIS — Z8249 Family history of ischemic heart disease and other diseases of the circulatory system: Secondary | ICD-10-CM | POA: Insufficient documentation

## 2017-12-31 DIAGNOSIS — Z803 Family history of malignant neoplasm of breast: Secondary | ICD-10-CM | POA: Insufficient documentation

## 2017-12-31 DIAGNOSIS — I10 Essential (primary) hypertension: Secondary | ICD-10-CM | POA: Diagnosis not present

## 2017-12-31 DIAGNOSIS — C50412 Malignant neoplasm of upper-outer quadrant of left female breast: Secondary | ICD-10-CM | POA: Diagnosis not present

## 2017-12-31 DIAGNOSIS — Z8349 Family history of other endocrine, nutritional and metabolic diseases: Secondary | ICD-10-CM | POA: Diagnosis not present

## 2017-12-31 DIAGNOSIS — Z820 Family history of epilepsy and other diseases of the nervous system: Secondary | ICD-10-CM | POA: Diagnosis not present

## 2017-12-31 DIAGNOSIS — Z823 Family history of stroke: Secondary | ICD-10-CM | POA: Diagnosis not present

## 2017-12-31 DIAGNOSIS — Z9221 Personal history of antineoplastic chemotherapy: Secondary | ICD-10-CM | POA: Insufficient documentation

## 2017-12-31 DIAGNOSIS — Z79899 Other long term (current) drug therapy: Secondary | ICD-10-CM | POA: Diagnosis not present

## 2017-12-31 HISTORY — DX: Anemia, unspecified: D64.9

## 2017-12-31 HISTORY — PX: MASTECTOMY MODIFIED RADICAL: SHX5962

## 2017-12-31 LAB — COMPREHENSIVE METABOLIC PANEL
ALT: 14 U/L (ref 0–44)
AST: 20 U/L (ref 15–41)
Albumin: 3.1 g/dL — ABNORMAL LOW (ref 3.5–5.0)
Alkaline Phosphatase: 40 U/L (ref 38–126)
Anion gap: 7 (ref 5–15)
BILIRUBIN TOTAL: 0.4 mg/dL (ref 0.3–1.2)
BUN: 14 mg/dL (ref 8–23)
CHLORIDE: 108 mmol/L (ref 98–111)
CO2: 25 mmol/L (ref 22–32)
CREATININE: 1.07 mg/dL — AB (ref 0.44–1.00)
Calcium: 8.3 mg/dL — ABNORMAL LOW (ref 8.9–10.3)
GFR calc Af Amer: >60 mL/min (ref >60)
GFR, EST NON AFRICAN AMERICAN: 54 mL/min — AB (ref >60)
GLUCOSE: 136 mg/dL — AB (ref 70–99)
Potassium: 3.9 mmol/L (ref 3.5–5.1)
Sodium: 140 mmol/L (ref 135–145)
Total Protein: 6.1 g/dL — ABNORMAL LOW (ref 6.5–8.1)

## 2017-12-31 LAB — FOLATE: Folate: 6.7 ng/mL (ref >5.9)

## 2017-12-31 LAB — CBC WITH DIFFERENTIAL/PLATELET
BASOS ABS: 0 10*3/uL (ref 0.0–0.1)
Basophils Relative: 0 %
Eosinophils Absolute: 0.3 10*3/uL (ref 0.0–0.7)
Eosinophils Relative: 4 %
HEMATOCRIT: 28.3 % — AB (ref 36.0–46.0)
Hemoglobin: 9.5 g/dL — ABNORMAL LOW (ref 12.0–15.0)
LYMPHS PCT: 18 %
Lymphs Abs: 1.3 10*3/uL (ref 0.7–4.0)
MCH: 33.2 pg (ref 26.0–34.0)
MCHC: 33.6 g/dL (ref 30.0–36.0)
MCV: 99 fL (ref 78.0–100.0)
Monocytes Absolute: 0.5 10*3/uL (ref 0.1–1.0)
Monocytes Relative: 7 %
NEUTROS ABS: 5.2 10*3/uL (ref 1.7–7.7)
NEUTROS PCT: 71 %
PLATELETS: 204 10*3/uL (ref 150–400)
RBC: 2.86 MIL/uL — AB (ref 3.87–5.11)
RDW: 13 % (ref 11.5–15.5)
WBC: 7.3 10*3/uL (ref 4.0–10.5)

## 2017-12-31 LAB — MAGNESIUM: MAGNESIUM: 1.7 mg/dL (ref 1.7–2.4)

## 2017-12-31 LAB — IRON AND TIBC
IRON: 45 ug/dL (ref 28–170)
SATURATION RATIOS: 18 % (ref 10.4–31.8)
TIBC: 248 ug/dL — ABNORMAL LOW (ref 250–450)
UIBC: 203 ug/dL

## 2017-12-31 LAB — VITAMIN B12: Vitamin B-12: 243 pg/mL (ref 180–914)

## 2017-12-31 LAB — RETICULOCYTES
RBC.: 2.85 MIL/uL — ABNORMAL LOW (ref 3.87–5.11)
RETIC COUNT ABSOLUTE: 68.4 10*3/uL (ref 19.0–186.0)
Retic Ct Pct: 2.4 % (ref 0.4–3.1)

## 2017-12-31 LAB — FERRITIN: FERRITIN: 221 ng/mL (ref 11–307)

## 2017-12-31 LAB — TSH: TSH: 1.412 u[IU]/mL (ref 0.350–4.500)

## 2017-12-31 SURGERY — MASTECTOMY, MODIFIED RADICAL
Anesthesia: General | Laterality: Left

## 2017-12-31 MED ORDER — DIPHENHYDRAMINE HCL 50 MG/ML IJ SOLN: 12.5000 mg | Freq: Four times a day (QID) | INTRAMUSCULAR | Status: DC | PRN

## 2017-12-31 MED ORDER — SUCCINYLCHOLINE CHLORIDE 20 MG/ML IJ SOLN
INTRAMUSCULAR | Status: DC | PRN
  Administered 2017-12-31: 150 mg via INTRAVENOUS

## 2017-12-31 MED ORDER — FENTANYL CITRATE (PF) 100 MCG/2ML IJ SOLN
INTRAMUSCULAR | Status: AC
  Filled 2017-12-31: qty 2

## 2017-12-31 MED ORDER — ROCURONIUM BROMIDE 50 MG/5ML IV SOLN
INTRAVENOUS | Status: AC
  Filled 2017-12-31: qty 1

## 2017-12-31 MED ORDER — LACTATED RINGERS IV SOLN
INTRAVENOUS | Status: DC
  Administered 2017-12-31: 1000 mL via INTRAVENOUS

## 2017-12-31 MED ORDER — FENTANYL CITRATE (PF) 100 MCG/2ML IJ SOLN
INTRAMUSCULAR | Status: DC | PRN
  Administered 2017-12-31 (×2): 50 ug via INTRAVENOUS

## 2017-12-31 MED ORDER — EPHEDRINE SULFATE 50 MG/ML IJ SOLN
INTRAMUSCULAR | Status: AC
  Filled 2017-12-31: qty 1

## 2017-12-31 MED ORDER — POVIDONE-IODINE 10 % OINT PACKET
TOPICAL_OINTMENT | CUTANEOUS | Status: DC | PRN
  Administered 2017-12-31: 1 via TOPICAL

## 2017-12-31 MED ORDER — ONDANSETRON HCL 4 MG/2ML IJ SOLN
INTRAMUSCULAR | Status: AC
  Filled 2017-12-31: qty 2

## 2017-12-31 MED ORDER — ONDANSETRON 4 MG PO TBDP: 4.0000 mg | ORAL_TABLET | Freq: Four times a day (QID) | ORAL | Status: DC | PRN

## 2017-12-31 MED ORDER — ONDANSETRON HCL 4 MG/2ML IJ SOLN: 4.0000 mg | Freq: Four times a day (QID) | INTRAMUSCULAR | Status: DC | PRN

## 2017-12-31 MED ORDER — FENTANYL CITRATE (PF) 100 MCG/2ML IJ SOLN
INTRAMUSCULAR | Status: AC
  Filled 2017-12-31: qty 4

## 2017-12-31 MED ORDER — KETOROLAC TROMETHAMINE 30 MG/ML IJ SOLN: 30.0000 mg | Freq: Four times a day (QID) | INTRAMUSCULAR | Status: DC | PRN

## 2017-12-31 MED ORDER — ENOXAPARIN SODIUM 40 MG/0.4ML ~~LOC~~ SOLN
40.0000 mg | SUBCUTANEOUS | Status: DC
  Administered 2018-01-01: 40 mg via SUBCUTANEOUS
  Filled 2017-12-31: qty 0.4

## 2017-12-31 MED ORDER — MIDAZOLAM HCL 5 MG/5ML IJ SOLN
INTRAMUSCULAR | Status: DC | PRN
  Administered 2017-12-31: 2 mg via INTRAVENOUS

## 2017-12-31 MED ORDER — AMLODIPINE BESYLATE 5 MG PO TABS
5.0000 mg | ORAL_TABLET | Freq: Every day | ORAL | Status: DC
  Administered 2018-01-01: 5 mg via ORAL
  Filled 2017-12-31 (×2): qty 1

## 2017-12-31 MED ORDER — ONDANSETRON HCL 4 MG/2ML IJ SOLN
INTRAMUSCULAR | Status: DC | PRN
  Administered 2017-12-31: 4 mg via INTRAVENOUS

## 2017-12-31 MED ORDER — SUCCINYLCHOLINE CHLORIDE 20 MG/ML IJ SOLN
INTRAMUSCULAR | Status: AC
  Filled 2017-12-31: qty 1

## 2017-12-31 MED ORDER — 0.9 % SODIUM CHLORIDE (POUR BTL) OPTIME
TOPICAL | Status: DC | PRN
  Administered 2017-12-31: 1000 mL

## 2017-12-31 MED ORDER — SODIUM CHLORIDE 0.9 % IV SOLN
INTRAVENOUS | Status: DC
  Administered 2017-12-31 (×2): via INTRAVENOUS

## 2017-12-31 MED ORDER — SODIUM CHLORIDE 0.9 % IJ SOLN
INTRAMUSCULAR | Status: AC
  Filled 2017-12-31: qty 10

## 2017-12-31 MED ORDER — LACTATED RINGERS IV SOLN: INTRAVENOUS | Status: DC

## 2017-12-31 MED ORDER — CHLORHEXIDINE GLUCONATE CLOTH 2 % EX PADS: 6.0000 | MEDICATED_PAD | Freq: Once | CUTANEOUS | Status: DC

## 2017-12-31 MED ORDER — LIDOCAINE HCL 1 % IJ SOLN
INTRAMUSCULAR | Status: DC | PRN
  Administered 2017-12-31: 40 mg via INTRADERMAL

## 2017-12-31 MED ORDER — PROMETHAZINE HCL 25 MG/ML IJ SOLN: 6.2500 mg | INTRAMUSCULAR | Status: DC | PRN

## 2017-12-31 MED ORDER — BUPIVACAINE LIPOSOME 1.3 % IJ SUSP
INTRAMUSCULAR | Status: AC
  Filled 2017-12-31: qty 20

## 2017-12-31 MED ORDER — MIDAZOLAM HCL 2 MG/2ML IJ SOLN
INTRAMUSCULAR | Status: AC
  Filled 2017-12-31: qty 2

## 2017-12-31 MED ORDER — KETOROLAC TROMETHAMINE 30 MG/ML IJ SOLN
30.0000 mg | Freq: Four times a day (QID) | INTRAMUSCULAR | Status: AC
  Administered 2017-12-31: 30 mg via INTRAVENOUS
  Filled 2017-12-31: qty 1

## 2017-12-31 MED ORDER — HYDROMORPHONE HCL 1 MG/ML IJ SOLN: 1.0000 mg | INTRAMUSCULAR | Status: DC | PRN

## 2017-12-31 MED ORDER — MAGNESIUM SULFATE 2 GM/50ML IV SOLN
2.0000 g | Freq: Once | INTRAVENOUS | Status: AC
  Administered 2017-12-31: 2 g via INTRAVENOUS
  Filled 2017-12-31: qty 50

## 2017-12-31 MED ORDER — MEPERIDINE HCL 50 MG/ML IJ SOLN: 6.2500 mg | INTRAMUSCULAR | Status: DC | PRN

## 2017-12-31 MED ORDER — LORAZEPAM 2 MG/ML IJ SOLN: 1.0000 mg | INTRAMUSCULAR | Status: DC | PRN

## 2017-12-31 MED ORDER — DIPHENHYDRAMINE HCL 12.5 MG/5ML PO ELIX
12.5000 mg | ORAL_SOLUTION | Freq: Four times a day (QID) | ORAL | Status: DC | PRN
  Filled 2017-12-31: qty 5

## 2017-12-31 MED ORDER — BUPIVACAINE LIPOSOME 1.3 % IJ SUSP
INTRAMUSCULAR | Status: DC | PRN
  Administered 2017-12-31: 20 mL

## 2017-12-31 MED ORDER — PROPOFOL 10 MG/ML IV BOLUS
INTRAVENOUS | Status: DC | PRN
  Administered 2017-12-31: 160 mg via INTRAVENOUS

## 2017-12-31 MED ORDER — ENOXAPARIN SODIUM 40 MG/0.4ML ~~LOC~~ SOLN
40.0000 mg | Freq: Once | SUBCUTANEOUS | Status: AC
  Administered 2017-12-31: 40 mg via SUBCUTANEOUS
  Filled 2017-12-31: qty 0.4

## 2017-12-31 MED ORDER — ACETAMINOPHEN 325 MG PO TABS: 650.0000 mg | ORAL_TABLET | Freq: Four times a day (QID) | ORAL | Status: DC | PRN

## 2017-12-31 MED ORDER — CEFAZOLIN SODIUM-DEXTROSE 2-4 GM/100ML-% IV SOLN
2.0000 g | INTRAVENOUS | Status: AC
  Administered 2017-12-31: 2 g via INTRAVENOUS
  Filled 2017-12-31: qty 100

## 2017-12-31 MED ORDER — POVIDONE-IODINE 10 % EX OINT
TOPICAL_OINTMENT | CUTANEOUS | Status: AC
  Filled 2017-12-31: qty 1

## 2017-12-31 MED ORDER — HYDROCODONE-ACETAMINOPHEN 5-325 MG PO TABS
1.0000 | ORAL_TABLET | ORAL | Status: DC | PRN
  Administered 2018-01-01: 2 via ORAL
  Filled 2017-12-31: qty 2

## 2017-12-31 MED ORDER — METOPROLOL TARTRATE 50 MG PO TABS
50.0000 mg | ORAL_TABLET | Freq: Two times a day (BID) | ORAL | Status: DC
  Administered 2017-12-31 – 2018-01-01 (×3): 50 mg via ORAL
  Filled 2017-12-31 (×6): qty 1

## 2017-12-31 MED ORDER — ACETAMINOPHEN 650 MG RE SUPP
650.0000 mg | Freq: Four times a day (QID) | RECTAL | Status: DC | PRN
  Filled 2017-12-31: qty 1

## 2017-12-31 MED ORDER — HYDROCODONE-ACETAMINOPHEN 7.5-325 MG PO TABS: 1.0000 | ORAL_TABLET | Freq: Once | ORAL | Status: DC | PRN

## 2017-12-31 MED ORDER — LIDOCAINE HCL (PF) 1 % IJ SOLN
INTRAMUSCULAR | Status: AC
  Filled 2017-12-31: qty 5

## 2017-12-31 MED ORDER — HYDROMORPHONE HCL 1 MG/ML IJ SOLN
0.2500 mg | INTRAMUSCULAR | Status: DC | PRN
  Administered 2017-12-31: 0.5 mg via INTRAVENOUS
  Filled 2017-12-31: qty 0.5

## 2017-12-31 SURGICAL SUPPLY — 42 items
APPLIER CLIP 11 MED OPEN (CLIP)
APPLIER CLIP 9.375 SM OPEN (CLIP) ×3
BINDER BREAST LRG (GAUZE/BANDAGES/DRESSINGS) ×6 IMPLANT
CHLORAPREP W/TINT 26ML (MISCELLANEOUS) ×3 IMPLANT
CLIP APPLIE 11 MED OPEN (CLIP) IMPLANT
CLIP APPLIE 9.375 SM OPEN (CLIP) ×1 IMPLANT
CLOTH BEACON ORANGE TIMEOUT ST (SAFETY) ×3 IMPLANT
COVER LIGHT HANDLE STERIS (MISCELLANEOUS) ×6 IMPLANT
DRAPE PROXIMA HALF (DRAPES) ×3 IMPLANT
ELECT REM PT RETURN 9FT ADLT (ELECTROSURGICAL) ×3
ELECTRODE REM PT RTRN 9FT ADLT (ELECTROSURGICAL) ×1 IMPLANT
EVACUATOR DRAINAGE 10X20 100CC (DRAIN) ×1 IMPLANT
EVACUATOR SILICONE 100CC (DRAIN) ×2
GAUZE SPONGE 4X4 12PLY STRL (GAUZE/BANDAGES/DRESSINGS) ×3 IMPLANT
GLOVE BIOGEL PI IND STRL 7.0 (GLOVE) ×3 IMPLANT
GLOVE BIOGEL PI INDICATOR 7.0 (GLOVE) ×6
GLOVE ECLIPSE 6.5 STRL STRAW (GLOVE) ×3 IMPLANT
GLOVE SURG SS PI 7.5 STRL IVOR (GLOVE) ×3 IMPLANT
GOWN STRL REUS W/TWL LRG LVL3 (GOWN DISPOSABLE) ×9 IMPLANT
INST SET MINOR GENERAL (KITS) ×3 IMPLANT
KIT TURNOVER KIT A (KITS) ×3 IMPLANT
MANIFOLD NEPTUNE II (INSTRUMENTS) ×3 IMPLANT
NEEDLE HYPO 21X1.5 SAFETY (NEEDLE) ×3 IMPLANT
NS IRRIG 1000ML POUR BTL (IV SOLUTION) ×3 IMPLANT
PACK MINOR (CUSTOM PROCEDURE TRAY) ×3 IMPLANT
PAD ABD 5X9 TENDERSORB (GAUZE/BANDAGES/DRESSINGS) ×6 IMPLANT
PAD ARMBOARD 7.5X6 YLW CONV (MISCELLANEOUS) ×3 IMPLANT
SET BASIN LINEN APH (SET/KITS/TRAYS/PACK) ×3 IMPLANT
SPONGE DRAIN TRACH 4X4 STRL 2S (GAUZE/BANDAGES/DRESSINGS) ×3 IMPLANT
SPONGE INTESTINAL PEANUT (DISPOSABLE) ×3 IMPLANT
SPONGE LAP 18X18 X RAY DECT (DISPOSABLE) ×6 IMPLANT
STAPLER VISISTAT (STAPLE) ×3 IMPLANT
SUT ETHILON 3 0 FSL (SUTURE) ×3 IMPLANT
SUT SILK 2 0 (SUTURE) ×2
SUT SILK 2 0 SH (SUTURE) ×3 IMPLANT
SUT SILK 2-0 18XBRD TIE 12 (SUTURE) ×1 IMPLANT
SUT VIC AB 2-0 CT1 27 (SUTURE) ×10
SUT VIC AB 2-0 CT1 TAPERPNT 27 (SUTURE) ×5 IMPLANT
SUT VIC AB 3-0 SH 27 (SUTURE) ×2
SUT VIC AB 3-0 SH 27X BRD (SUTURE) ×1 IMPLANT
SYR 20CC LL (SYRINGE) ×3 IMPLANT
SYR BULB IRRIGATION 50ML (SYRINGE) ×3 IMPLANT

## 2017-12-31 NOTE — Interval H&P Note (Signed)
History and Physical Interval Note:  12/31/2017 7:17 AM  Lisa Thornton  has presented today for surgery, with the diagnosis of left breast cancer  The various methods of treatment have been discussed with the patient and family. After consideration of risks, benefits and other options for treatment, the patient has consented to  Procedure(s): MASTECTOMY MODIFIED RADICAL (Left) as a surgical intervention .  The patient's history has been reviewed, patient examined, no change in status, stable for surgery.  I have reviewed the patient's chart and labs.  Questions were answered to the patient's satisfaction.     Aviva Signs

## 2017-12-31 NOTE — Anesthesia Preprocedure Evaluation (Signed)
Anesthesia Evaluation  Patient identified by MRN, date of birth, ID band Patient awake    Reviewed: Allergy & Precautions, H&P , NPO status , Patient's Chart, lab work & pertinent test results, reviewed documented beta blocker date and time   Airway Mallampati: III  TM Distance: >3 FB Neck ROM: full    Dental no notable dental hx. (+) Missing, Loose, Dental Advidsory Given   Pulmonary neg pulmonary ROS,    Pulmonary exam normal breath sounds clear to auscultation       Cardiovascular Exercise Tolerance: Good hypertension, On Medications negative cardio ROS   Rhythm:regular Rate:Normal     Neuro/Psych negative neurological ROS  negative psych ROS   GI/Hepatic negative GI ROS, Neg liver ROS,   Endo/Other  negative endocrine ROS  Renal/GU negative Renal ROS  negative genitourinary   Musculoskeletal   Abdominal   Peds  Hematology negative hematology ROS (+)   Anesthesia Other Findings Denies any other Sig PMH x HTN, Breast Ca One single upper front tooth is very loose.  Pt attempted to see dentist for extraction but no current appts.  Discussed likely loss of tooth on emergence   Reproductive/Obstetrics negative OB ROS                             Anesthesia Physical Anesthesia Plan  ASA: III  Anesthesia Plan: General   Post-op Pain Management:    Induction:   PONV Risk Score and Plan:   Airway Management Planned:   Additional Equipment:   Intra-op Plan:   Post-operative Plan:   Informed Consent: I have reviewed the patients History and Physical, chart, labs and discussed the procedure including the risks, benefits and alternatives for the proposed anesthesia with the patient or authorized representative who has indicated his/her understanding and acceptance.   Dental Advisory Given  Plan Discussed with: CRNA and Anesthesiologist  Anesthesia Plan Comments:          Anesthesia Quick Evaluation

## 2017-12-31 NOTE — Progress Notes (Addendum)
Patient has bed for transfer to 300. Report called to [place]. While emptying patients JP drain before transport, patient heart rate jumped to 115 Afib. Strip printed and applied to chart. Dr. Currie Paris and Dr. Arnoldo Morale notified. Dr. Arnoldo Morale gave orders for telemetry and a hospitalist consult for new onset Afib. Patient has return to NSR with rate of 80 before transport. Report of changes called to [place].  Patients nephew notified of transfer.

## 2017-12-31 NOTE — Anesthesia Procedure Notes (Signed)
Procedure Name: Intubation Date/Time: 12/31/2017 7:37 AM Performed by: Charmaine Downs, CRNA Pre-anesthesia Checklist: Patient identified, Suction available, Emergency Drugs available, Patient being monitored and Timeout performed Patient Re-evaluated:Patient Re-evaluated prior to induction Oxygen Delivery Method: Circle system utilized Preoxygenation: Pre-oxygenation with 100% oxygen Induction Type: IV induction and Cricoid Pressure applied Ventilation: Mask ventilation without difficulty Laryngoscope Size: Mac and 3 Grade View: Grade II Tube type: Oral Tube size: 7.0 mm Number of attempts: 1 Placement Confirmation: ETT inserted through vocal cords under direct vision,  positive ETCO2 and breath sounds checked- equal and bilateral Secured at: 22 cm Tube secured with: Tape Comments: Upper extremely loose incisor removed during laryngoscopy.Pt was aware of this possibility and the high probability of tooth removal occurring.She stated understanding and agreed to proceed.

## 2017-12-31 NOTE — Op Note (Signed)
Patient:  Lisa Thornton  DOB:  1956-02-24  MRN:  [id number]   Preop Diagnosis: Left breast carcinoma  Postop Diagnosis: Same  Procedure: Left modified radical mastectomy  Surgeon: Aviva Signs, MD  Anes: General endotracheal  Indications: Patient is a 62 year old white female who underwent neoadjuvant chemotherapy for left breast carcinoma who now presents for left modified radical mastectomy.  The risks and benefits of the procedure including bleeding, infection, left arm swelling, and the possibility of left arm pain were fully explained to the patient, who gave informed consent.  Procedure note: Patient was placed in supine position.  After induction of general endotracheal anesthesia, the left breast and axilla were prepped and draped using the usual sterile technique with DuraPrep.  Surgical site confirmation was performed.  An elliptical incision was made medial to lateral around the left nipple.  A superior flap was formed to the clavicle and an inferior flap form to the chest wall.  The breast was then removed medial to lateral off the pectoralis major muscle using Bovie electrocautery.  A suture was placed superiorly for orientation purposes.  In continuity with the left breast, a level 2 left axillary dissection was performed.  Care was taken to avoid the long thoracic nerve and the thoracodorsal artery, vein, and nerve complex.  Several lymph nodes were noted to be swollen.  The bleeding was controlled using small clips.  The left breast and axillary contents were removed from the operative field and sent to pathology for further examination.  The wound was copiously irrigated with normal saline.  #10 flat Jackson-Pratt drain was placed in the left axilla and along the left flap.  It was secured to the skin level using a 3-0 nylon interrupted suture.  The subcutaneous layer was reapproximated using 2-0 Vicryl interrupted sutures.  Exparel was instilled into the surrounding wound.  The  skin was closed using staples.  Betadine ointment and a dry sterile dressing were applied.  All tape and needle counts were correct at the end of the procedure.  The patient was extubated in the operating room and transferred to PACU in stable condition.  Complications: None  EBL: 50 cc  Specimen: Left breast and axillary contents  Drains: Jackson-Pratt drain to left flap and axilla

## 2017-12-31 NOTE — Addendum Note (Signed)
Addendum  created 12/31/17 1038 by Charmaine Downs, CRNA   Intraprocedure Meds edited

## 2017-12-31 NOTE — Consult Note (Signed)
[facility] Medical Consultation  Lisa Thornton GMW:[id number] DOB: 1955/06/05 DOA: 12/31/2017 PCP: Patient, No Pcp Per   Requesting physician: Dr. Aviva Signs. Date of consultation: 12/31/2017 Reason for consultation: Transient atrial fibrillation.  Impression/Recommendations Principal Problem:   Atrial fibrillation (HCC) CHA?DS?-VASc Score of at least 2. Per patient, she has not had these issues with before to her knowledge. Continue cardiac monitoring. Continue metoprolol 50 mg p.o. twice daily for rate control. Supplement magnesium to a target level of 2.0 mg/dL or more. Check echocardiogram in the morning. Given today's surgical procedure, I am not inclined to start anticoagulation tonight. The patient is currently getting Lovenox for DVT prophylaxis. Consult cardiology in a.m.  Active Problems:   S/P mastectomy, left Continue postop care per Dr. Arnoldo Morale.       Breast cancer of upper-outer quadrant of left female breast [facility]) Further treatment plan per oncology.    Hypertension Continue amlodipine 5 mg p.o. daily. Continue metoprolol. Monitor blood pressure and heart rate.    Anemia The patient previously had a mildly elevated MCV. Check anemia panel.    I will followup again tomorrow. Please contact me if I can be of assistance in the meanwhile. Thank you for this consultation.  Chief Complaint: Brief atrial fibrillation episode.  HPI:  The patient is a 62 year old female with a past medical history of hypertension, breast cancer who underwent a left modified radical mastectomy earlier today and while having the JP drain and Te[place] before transport, she states that she got very nervous and had a transient episode of atrial fibrillation with RVR.  The ventricular rate went up to 115 bpm, per nursing staff.  They printed the monitor strip, but I had not been able to see it.  She denies chest pain, dizziness, diaphoresis, PND or orthopnea.  She does not get  palpitations frequently, but states she gets them when she becomes very nervous.  She denies fever, chills, sore throat, wheezing, hemoptysis, abdominal pain, nausea, emesis, constipation, melena or hematochezia.  She gets occasional diarrhea.  She denies dysuria, frequency or hematuria.  No heat or cold intolerance.  No polyuria, polydipsia, polyphagia or blurred vision.  Denies skin pruritus.  Review of Systems:  As above mentioned.  Past Medical History:  Diagnosis Date  . Cancer [facility])    left breast cancer  . Chest pain   . Hypertension    Past Surgical History:  Procedure Laterality Date  . PORTACATH PLACEMENT Right 06/27/2017   Procedure: INSERTION PORT-A-CATH;  Surgeon: Aviva Signs, MD;  Location: [facility];  Service: General;  Laterality: Right;   Social History:  reports that she has never smoked. She has never used smokeless tobacco. She reports that she does not drink alcohol or use drugs.  No Known Allergies Family History  Problem Relation Age of Onset  . Breast cancer Mother   . Thyroid disease Mother   . Heart disease Mother   . Heart attack Father   . Heart attack Sister   . Hypertension Sister   . Heart attack Brother   . Cancer Sister   . Stroke Brother   . Alzheimer's disease Maternal Aunt     Prior to Admission medications   Medication Sig Start Date End Date Taking? Authorizing Provider  amLODipine (NORVASC) 5 MG tablet Take 1 tablet (5 mg total) by mouth daily. 08/15/17  Yes Derek Jack, MD  metoprolol tartrate (LOPRESSOR) 50 MG tablet Take 50 mg by mouth 2 (two) times daily.   Yes [provider]  ondansetron (ZOFRAN) 8 MG tablet Take 1 tablet (8 mg total) by mouth 2 (two) times daily as needed. Start on the third day after chemotherapy. 07/18/17  Yes Derek Jack, MD  prochlorperazine (COMPAZINE) 10 MG tablet Take 1 tablet (10 mg total) by mouth every 6 (six) hours as needed (Nausea or vomiting). 07/18/17  Yes Derek Jack,  MD  HYDROcodone-acetaminophen (NORCO) 5-325 MG tablet Take 1 tablet by mouth every 6 (six) hours as needed for moderate pain. 06/27/17   Aviva Signs, MD   Physical Exam: Blood pressure 138/61, pulse 76, temperature 98.7 F (37.1 C), temperature source Oral, resp. rate 20, height 5\' 4"  (1.626 m), weight 72 kg, SpO2 96 %. Vitals:   12/31/17 1506 12/31/17 1906  BP: 132/67 138/61  Pulse: 84 76  Resp: 18 20  Temp: 98.4 F (36.9 C) 98.7 F (37.1 C)  SpO2: 96% 96%     General: No acute distress.  Eyes: PERRLA, normal conjunctiva and eyelids.  ENT: Oral mucosa is moist.  Throat is clear.  Neck: Supple, no JVD.  Cardiovascular: S1, S2, RRR, no S3-S4, no murmurs, no rubs.  Respiratory: CTA bilaterally.  Abdomen: Soft, nontender.  Skin: No obvious rashes on gross dermatological exam.  Musculoskeletal: No gross joint deformity.  Psychiatric: Awake, alert, oriented x3.  Neurologic: No focal deficits.  Labs on Admission:  Basic Metabolic Panel: Recent Labs  Lab 12/25/17 1325 12/31/17 1743  NA 139 140  K 3.7 3.9  CL 110 108  CO2 21* 25  GLUCOSE 106* 136*  BUN 7* 14  CREATININE 0.72 1.07*  CALCIUM 9.3 8.3*  MG  --  1.7   Liver Function Tests: Recent Labs  Lab 12/31/17 1743  AST 20  ALT 14  ALKPHOS 40  BILITOT 0.4  PROT 6.1*  ALBUMIN 3.1*   No results for input(s): LIPASE, AMYLASE in the last 168 hours. No results for input(s): AMMONIA in the last 168 hours. CBC: Recent Labs  Lab 12/25/17 1325 12/31/17 1743  WBC 6.7 7.3  NEUTROABS 4.9 5.2  HGB 10.6* 9.5*  HCT 31.2* 28.3*  MCV 98.1 99.0  PLT 259 204   Cardiac Enzymes: No results for input(s): CKTOTAL, CKMB, CKMBINDEX, TROPONINI in the last 168 hours. BNP: Invalid input(s): POCBNP CBG: No results for input(s): GLUCAP in the last 168 hours.  Radiological Exams on Admission: No results found.  EKG: Independently reviewed.  Vent. rate 79 BPM PR interval 136 ms QRS duration 76 ms QT/QTc 408/467  ms P-R-T axes 55 -9 10 Normal sinus rhythm Normal ECG  Time spent: About 60 minutes were spent during the process of this consult. This includes, but is not limited to direct patient contact, physical exam, review of the chart and lab results with the patient and documentation.  All the patient's questions were answered.  Reubin Milan [facility] Pager [phone].  If 7PM-7AM, please contact night-coverage [email or web address] Password [facility] 12/31/2017, 7:24 PM

## 2017-12-31 NOTE — Transfer of Care (Signed)
Immediate Anesthesia Transfer of Care Note  Patient: Lisa Thornton  Procedure(s) Performed: LEFT MODIFIED RADICAL MASTECTOMY (Left )  Patient Location: PACU  Anesthesia Type:General  Level of Consciousness: awake and patient cooperative  Airway & Oxygen Therapy: Patient Spontanous Breathing and Patient connected to nasal cannula oxygen  Post-op Assessment: Report given to RN, Post -op Vital signs reviewed and stable and Patient moving all extremities  Post vital signs: Reviewed and stable  Last Vitals:  Vitals Value Taken Time  BP    Temp    Pulse    Resp    SpO2      Last Pain:  Vitals:   12/31/17 0652  TempSrc: Oral  PainSc: 0-No pain      Patients Stated Pain Goal: 6 (67/56/12 5483)  Complications: No apparent anesthesia complications

## 2017-12-31 NOTE — Anesthesia Postprocedure Evaluation (Signed)
Anesthesia Post Note  Patient: Lisa Thornton  Procedure(s) Performed: LEFT MODIFIED RADICAL MASTECTOMY (Left )  Patient location during evaluation: PACU Anesthesia Type: General Level of consciousness: awake and patient cooperative Pain management: satisfactory to patient Vital Signs Assessment: post-procedure vital signs reviewed and stable Respiratory status: spontaneous breathing Cardiovascular status: stable Postop Assessment: no apparent nausea or vomiting Anesthetic complications: no     Last Vitals:  Vitals:   12/31/17 0915 12/31/17 0920  BP: 133/62   Pulse: 76 77  Resp: 19 (!) 22  Temp:    SpO2: 100% 100%    Last Pain:  Vitals:   12/31/17 0920  TempSrc:   PainSc: Asleep                 Wenona [place]

## 2018-01-01 ENCOUNTER — Other Ambulatory Visit (HOSPITAL_COMMUNITY): Payer: Medicaid Other

## 2018-01-01 ENCOUNTER — Encounter (HOSPITAL_COMMUNITY): Payer: Self-pay | Admitting: General Surgery

## 2018-01-01 DIAGNOSIS — I10 Essential (primary) hypertension: Secondary | ICD-10-CM

## 2018-01-01 DIAGNOSIS — Z170 Estrogen receptor positive status [ER+]: Secondary | ICD-10-CM | POA: Diagnosis not present

## 2018-01-01 DIAGNOSIS — C50412 Malignant neoplasm of upper-outer quadrant of left female breast: Secondary | ICD-10-CM | POA: Diagnosis not present

## 2018-01-01 DIAGNOSIS — I4891 Unspecified atrial fibrillation: Secondary | ICD-10-CM | POA: Diagnosis not present

## 2018-01-01 LAB — BASIC METABOLIC PANEL
ANION GAP: 7 (ref 5–15)
BUN: 14 mg/dL (ref 8–23)
CALCIUM: 8.3 mg/dL — AB (ref 8.9–10.3)
CO2: 24 mmol/L (ref 22–32)
CREATININE: 0.91 mg/dL (ref 0.44–1.00)
Chloride: 109 mmol/L (ref 98–111)
GFR calc non Af Amer: >60 mL/min (ref >60)
Glucose, Bld: 143 mg/dL — ABNORMAL HIGH (ref 70–99)
Potassium: 3.9 mmol/L (ref 3.5–5.1)
SODIUM: 140 mmol/L (ref 135–145)

## 2018-01-01 LAB — CBC
HEMATOCRIT: 28.4 % — AB (ref 36.0–46.0)
HEMOGLOBIN: 9.4 g/dL — AB (ref 12.0–15.0)
MCH: 33 pg (ref 26.0–34.0)
MCHC: 33.1 g/dL (ref 30.0–36.0)
MCV: 99.6 fL (ref 78.0–100.0)
Platelets: 206 10*3/uL (ref 150–400)
RBC: 2.85 MIL/uL — AB (ref 3.87–5.11)
RDW: 13.1 % (ref 11.5–15.5)
WBC: 7 10*3/uL (ref 4.0–10.5)

## 2018-01-01 LAB — TYPE AND SCREEN
ABO/RH(D): O POS
Antibody Screen: NEGATIVE

## 2018-01-01 MED ORDER — HYDROCODONE-ACETAMINOPHEN 5-325 MG PO TABS: 1.0000 | ORAL_TABLET | ORAL | 0 refills | Status: DC | PRN

## 2018-01-01 NOTE — Discharge Summary (Signed)
Physician Discharge Summary  Patient ID: Lisa Thornton MRN: [id number] DOB/AGE: [birth year] 62 y.o.  Admit date: 12/31/2017 Discharge date: 01/01/2018  Admission Diagnoses: Left breast carcinoma  Discharge Diagnoses: Same Principal Problem:   Atrial fibrillation [facility]) Active Problems:   Hypertension   Breast cancer of upper-outer quadrant of left female breast ([place])   S/P mastectomy, left   Anemia   Discharged Condition: good  Hospital Course: Patient is a 62 year old white female who underwent neoadjuvant chemotherapy for left breast carcinoma who presented on 12/31/2017 for left modified radical mastectomy.  She tolerated the procedure well.  In the recovery room she had an episode of atrial fibrillation.  This resolved spontaneously.  [facility] were consulted to follow this.  While in the hospital, she had no further episodes of atrial fibrillation.  A follow-up 2D echo has been ordered.  The patient is being discharged home on postoperative day 1 after her echo in good and improving condition.  Treatments: surgery: Left modified radical mastectomy on 12/31/2017  Discharge Exam: Blood pressure (!) 145/67, pulse 71, temperature 98.2 F (36.8 C), temperature source Oral, resp. rate 20, height 5\' 4"  (1.626 m), weight 72 kg, SpO2 95 %. General appearance: alert, cooperative and no distress Resp: clear to auscultation bilaterally Breasts: Left mastectomy incision healing well without hematoma.  JP drain in place, serosanguinous drainage. Cardio: regular rate and rhythm, S1, S2 normal, no murmur, click, rub or gallop  Disposition: Discharge disposition: 01-Home or Self Care       Discharge Instructions    Diet - low sodium heart healthy   Complete by:  As directed    Increase activity slowly   Complete by:  As directed      Allergies as of 01/01/2018   No Known Allergies     Medication List    TAKE these medications   amLODipine 5 MG tablet Commonly known as:   NORVASC Take 1 tablet (5 mg total) by mouth daily.   HYDROcodone-acetaminophen 5-325 MG tablet Commonly known as:  NORCO/VICODIN Take 1 tablet by mouth every 4 (four) hours as needed for moderate pain. What changed:  when to take this   metoprolol tartrate 50 MG tablet Commonly known as:  LOPRESSOR Take 50 mg by mouth 2 (two) times daily.   ondansetron 8 MG tablet Commonly known as:  ZOFRAN Take 1 tablet (8 mg total) by mouth 2 (two) times daily as needed. Start on the third day after chemotherapy.   prochlorperazine 10 MG tablet Commonly known as:  COMPAZINE Take 1 tablet (10 mg total) by mouth every 6 (six) hours as needed (Nausea or vomiting).      Follow-up Information    Aviva Signs, MD. Schedule an appointment as soon as possible for a visit on 01/08/2018.   Specialty:  General Surgery Contact information: [address] [place] [address] [phone]           Signed: Aviva Signs 01/01/2018, 8:14 AM

## 2018-01-01 NOTE — Anesthesia Postprocedure Evaluation (Signed)
Anesthesia Post Note  Patient: Lisa Thornton  Procedure(s) Performed: LEFT MODIFIED RADICAL MASTECTOMY (Left )  Patient location during evaluation: Nursing Unit Anesthesia Type: General Level of consciousness: patient cooperative and awake Pain management: pain level controlled Vital Signs Assessment: post-procedure vital signs reviewed and stable Respiratory status: spontaneous breathing, nonlabored ventilation and respiratory function stable Cardiovascular status: blood pressure returned to baseline Postop Assessment: no apparent nausea or vomiting Anesthetic complications: no     Last Vitals:  Vitals:   12/31/17 2305 01/01/18 0703  BP: (!) 142/65 (!) 145/67  Pulse: 74 71  Resp:    Temp: 37 C 36.8 C  SpO2: 94% 95%    Last Pain:  Vitals:   01/01/18 0742  TempSrc:   PainSc: 0-No pain                 Tejon Gracie J

## 2018-01-01 NOTE — Evaluation (Signed)
Physical Therapy Evaluation Patient Details Name: Lisa Thornton MRN: [id number] DOB: Nov 18, 1955 Today's Date: 01/01/2018   History of Present Illness  Patient is a 62 year old white female s/p Left modified radical mastectomy 12/31/17, who underwent neoadjuvant chemotherapy for left breast carcinoma    Clinical Impression  Patient instructed in post op mastectomy exercises for LUE with written instructions provided and understanding acknowledged.  Patient demonstrates good return for ambulation in room and hallway, able to transfer to commode and chair without loss of balance.  Plan:  Patient discharged from physical therapy to care of nursing for ambulation daily as tolerated for length of stay.    Follow Up Recommendations No PT follow up    Equipment Recommendations  None recommended by PT    Recommendations for Other Services       Precautions / Restrictions Precautions Precautions: None Restrictions Weight Bearing Restrictions: No      Mobility  Bed Mobility Overal bed mobility: Modified Independent                Transfers Overall transfer level: Modified independent                  Ambulation/Gait Ambulation/Gait assistance: Modified independent (Device/Increase time) Gait Distance (Feet): 100 Feet Assistive device: None Gait Pattern/deviations: WFL(Within Functional Limits) Gait velocity: decreased   General Gait Details: grossly WFL except slightly labored slower than normal cadence, no loss of balance  Stairs            Wheelchair Mobility    Modified Rankin (Stroke Patients Only)       Balance Overall balance assessment: Mild deficits observed, not formally tested                                           Pertinent Vitals/Pain Pain Assessment: No/denies pain    Home Living Family/patient expects to be discharged to:: Private residence Living Arrangements: Other relatives(sister) Available Help at  Discharge: Family Type of Home: Mobile home Home Access: Ramped entrance     Home Layout: One level Home Equipment: None      Prior Function Level of Independence: Independent         Comments: community ambulator, drives     Hand Dominance        Extremity/Trunk Assessment   Upper Extremity Assessment Upper Extremity Assessment: Overall WFL for tasks assessed;LUE deficits/detail LUE Deficits / Details: LUE grossly -3/5 due to discomfort at surgical site    Lower Extremity Assessment Lower Extremity Assessment: Overall WFL for tasks assessed    Cervical / Trunk Assessment Cervical / Trunk Assessment: Normal  Communication   Communication: No difficulties  Cognition Arousal/Alertness: Awake/alert Behavior During Therapy: WFL for tasks assessed/performed Overall Cognitive Status: Within Functional Limits for tasks assessed                                        General Comments      Exercises     Assessment/Plan    PT Assessment Patent does not need any further PT services  PT Problem List         PT Treatment Interventions      PT Goals (Current goals can be found in the Care Plan section)  Acute Rehab PT Goals Patient Stated Goal: return home  with her sister to help PT Goal Formulation: With patient Time For Goal Achievement: 01/01/18 Potential to Achieve Goals: Good    Frequency     Barriers to discharge        Co-evaluation               AM-PAC PT "6 Clicks" Daily Activity  Outcome Measure Difficulty turning over in bed (including adjusting bedclothes, sheets and blankets)?: None Difficulty moving from lying on back to sitting on the side of the bed? : None Difficulty sitting down on and standing up from a chair with arms (e.g., wheelchair, bedside commode, etc,.)?: None Help needed moving to and from a bed to chair (including a wheelchair)?: None Help needed walking in hospital room?: None Help needed climbing  3-5 steps with a railing? : None 6 Click Score: 24    End of Session   Activity Tolerance: Patient tolerated treatment well;Patient limited by fatigue Patient left: in chair;with call bell/phone within reach Nurse Communication: Mobility status PT Visit Diagnosis: Unsteadiness on feet (R26.81);Other abnormalities of gait and mobility (R26.89);Muscle weakness (generalized) (M62.81)    Time: 9191-6606 PT Time Calculation (min) (ACUTE ONLY): 24 min   Charges:   PT Evaluation $PT Eval Moderate Complexity: 1 Mod PT Treatments $Therapeutic Activity: 23-37 mins        1:55 PM, 01/01/18 Lonell Grandchild, MPT Physical Therapist with [facility] [address] [phone] office [phone] mobile phone

## 2018-01-01 NOTE — Addendum Note (Signed)
Addendum  created 01/01/18 1002 by Charmaine Downs, CRNA   Sign clinical note

## 2018-01-01 NOTE — Progress Notes (Signed)
IV removed,WNL. D/C instructions given to pt, verbalized understanding. Pt being D/C with the JP drain, emptied prior to transport. Instructions provided on how to care for it and the incisions. Pt sister at bedside to transport home.

## 2018-01-01 NOTE — Progress Notes (Signed)
PROGRESS NOTE    Lisa Thornton  MVE:[id number] DOB: Feb 02, 1956 DOA: 12/31/2017 PCP: [facility] [place]    Brief Narrative:  62 year old female with a history of breast cancer, admitted to the hospital for left modified radical mastectomy, found to have a transient episode of rapid atrial fibrillation. This occurred in the postoperative period ended.  She spontaneously converted back to sinus rhythm.  She has been started on metoprolol.  Heart rates overnight have been stable.  Hospitalist were consulted to evaluate transient atrial fibrillation.  Her CHADSVASc score is 2.  She does not have any prior history of any cardiac illness.  Case reviewed with Dr. Harl Bowie, no indication for anticoagulation at this time.  No indication for repeat echocardiogram since she had one in 06/2017.  She will be followed up by cardiology as an outpatient to be considered for event monitor.  No further work-up at this time for atrial fibrillation this recurs.  Okay for discharge from medicine standpoint.   Assessment & Plan:   Principal Problem:   Atrial fibrillation ([place]) Active Problems:   Hypertension   Breast cancer of upper-outer quadrant of left female breast (HCC)   S/P mastectomy, left   Anemia   Subjective: Feeling better.  No palpitations, shortness of breath or chest pain.  Objective: Vitals:   12/31/17 1515 12/31/17 1906 12/31/17 2305 01/01/18 0703  BP:  138/61 (!) 142/65 (!) 145/67  Pulse:  76 74 71  Resp:  20    Temp:  98.7 F (37.1 C) 98.6 F (37 C) 98.2 F (36.8 C)  TempSrc:  Oral Oral Oral  SpO2:  96% 94% 95%  Weight: 72 kg     Height: 5\' 4"  (1.626 m)       Intake/Output Summary (Last 24 hours) at 01/01/2018 1808 Last data filed at 01/01/2018 1056 Gross per 24 hour  Intake 1212.14 ml  Output 1100 ml  Net 112.14 ml   Filed Weights   12/31/17 1515  Weight: 72 kg    Examination:  General exam: Appears calm and comfortable  Respiratory system: Clear to  auscultation. Respiratory effort normal. Cardiovascular system: S1 & S2 heard, RRR. No JVD, murmurs, rubs, gallops or clicks. No pedal edema. Gastrointestinal system: Abdomen is nondistended, soft and nontender. No organomegaly or masses felt. Normal bowel sounds heard. Central nervous system: Alert and oriented. No focal neurological deficits. Extremities: Symmetric 5 x 5 power. Skin: No rashes, lesions or ulcers Psychiatry: Judgement and insight appear normal. Mood & affect appropriate.     Data Reviewed: I have personally reviewed following labs and imaging studies  CBC: Recent Labs  Lab 12/31/17 1743 01/01/18 0437  WBC 7.3 7.0  NEUTROABS 5.2  --   HGB 9.5* 9.4*  HCT 28.3* 28.4*  MCV 99.0 99.6  PLT 204 283   Basic Metabolic Panel: Recent Labs  Lab 12/31/17 1743 01/01/18 0437  NA 140 140  K 3.9 3.9  CL 108 109  CO2 25 24  GLUCOSE 136* 143*  BUN 14 14  CREATININE 1.07* 0.91  CALCIUM 8.3* 8.3*  MG 1.7  --    GFR: Estimated Creatinine Clearance: 61.5 mL/min (by C-G formula based on SCr of 0.91 mg/dL). Liver Function Tests: Recent Labs  Lab 12/31/17 1743  AST 20  ALT 14  ALKPHOS 40  BILITOT 0.4  PROT 6.1*  ALBUMIN 3.1*   No results for input(s): LIPASE, AMYLASE in the last 168 hours. No results for input(s): AMMONIA in the last 168 hours. Coagulation  Profile: No results for input(s): INR, PROTIME in the last 168 hours. Cardiac Enzymes: No results for input(s): CKTOTAL, CKMB, CKMBINDEX, TROPONINI in the last 168 hours. BNP (last 3 results) No results for input(s): PROBNP in the last 8760 hours. HbA1C: No results for input(s): HGBA1C in the last 72 hours. CBG: No results for input(s): GLUCAP in the last 168 hours. Lipid Profile: No results for input(s): CHOL, HDL, LDLCALC, TRIG, CHOLHDL, LDLDIRECT in the last 72 hours. Thyroid Function Tests: Recent Labs    12/31/17 1743  TSH 1.412   Anemia Panel: Recent Labs    12/31/17 1743  VITAMINB12 243    FOLATE 6.7  FERRITIN 221  TIBC 248*  IRON 45  RETICCTPCT 2.4   Sepsis Labs: No results for input(s): PROCALCITON, LATICACIDVEN in the last 168 hours.  No results found for this or any previous visit (from the past 240 hour(s)).       Radiology Studies: No results found.      Scheduled Meds: Continuous Infusions:   LOS: 0 days    Time spent: 25 minutes    Kathie Dike, MD [facility] Pager [phone]  If 7PM-7AM, please contact night-coverage [email or web address] Password [email or web address] 01/01/2018, 6:08 PM

## 2018-01-01 NOTE — Discharge Instructions (Signed)
Total or Modified Radical Mastectomy, Care After °Refer to this sheet in the next few weeks. These instructions provide you with information about caring for yourself after your procedure. Your health care provider may also give you more specific instructions. Your treatment has been planned according to current medical practices, but problems sometimes occur. Call your health care provider if you have any problems or questions after your procedure. °What can I expect after the procedure? °After your procedure, it is common to have: °· Pain. °· Numbness. °· Stiffness in your arm or shoulder. °· Feelings of stress, sadness, or depression. ° °If the lymph nodes under your arm were removed, you may have arm swelling, weakness, or numbness on the same side of your body as your surgery. °Follow these instructions at home: °Incision care °· There are many different ways to close and cover an incision, including stitches, skin glue, and adhesive strips. Follow your health care provider's instructions about: °? Incision care. °? Bandage (dressing) changes and removal. °? Incision closure removal. °· Check your incision area every day for signs of infection. Watch for: °? Redness, swelling, or pain. °? Fluid, blood, or pus. °· If you were sent home with a surgical drain in place, follow your health care provider's instructions for emptying it. °Bathing °· Do not take baths, swim, or use a hot tub until your health care provider approves. °· Take sponge baths until your health care provider says that you can start showering or bathing. °Activity °· Return to your normal activities as directed by your health care provider. °· Avoid strenuous exercise. °· Be careful to avoid any activities that could cause an injury to your arm on the side of your surgery. °· Do not lift anything that is heavier than 10 lb (4.5 kg). Avoid lifting with the arm that is on the side of your surgery. °· Do not carry heavy objects on your  shoulder. °· After your drain is removed, you should perform exercises to keep your arm from getting stiff and swollen. Talk with your health care provider about which exercises are safe for you. °General instructions °· Take medicines only as directed by your health care provider. °· You may eat what you usually do. °· Keep your arm elevated when at rest. °· Do not wear tight jewelry on your arm, wrist, or fingers on the side of your surgery. °· Get checked for extra fluid around your lymph nodes (lymphedema) as often as told by your health care provider. °· If you had a modified radical mastectomy, always let your health care providers know that lymph nodes under your arm were removed. This is important information to share before you are involved in certain procedures, such as giving blood or having your blood pressure taken. °Contact a health care provider if: °· You have a fever. °· Your pain medicine is not working. °· Your arm swelling, weakness, or numbness has not improved after a few weeks. °· You have new swelling in your breast or arm. °· You have redness, swelling, or pain in your incision area. °· You have fluid, blood, or pus coming from your incision. °Get help right away if: °· You have very bad pain in your breast or arm. °· You have chest pain. °· You have difficulty breathing. °This information is not intended to replace advice given to you by your health care provider. Make sure you discuss any questions you have with your health care provider. °Document Released: 11/11/2003 Document Revised: 11/25/2015   Document Reviewed: 12/03/2013 °Elsevier Interactive Patient Education © 2018 [employer] °Surgical Drain Home Care °Surgical drains are used to remove extra fluid that normally builds up in a surgical wound after surgery. A surgical drain helps to heal a surgical wound. Different kinds of surgical drains include: °· Active drains. These drains use suction to pull drainage away from the surgical  wound. Drainage flows through a tube to a container outside of the body. It is important to keep the bulb or the drainage container flat (compressed) at all times, except while you empty it. Flattening the bulb or container creates suction. The two most common types of active drains are bulb drains and Hemovac drains. °· Passive drains. These drains allow fluid to drain naturally, by gravity. Drainage flows through a tube to a bandage (dressing) or a container outside of the body. Passive drains do not need to be emptied. The most common type of passive drain is the Penrose drain. ° °A drain is placed during surgery. Immediately after surgery, drainage is usually bright red and a little thicker than water. The drainage may gradually turn yellow or pink and become thinner. It is likely that your health care provider will remove the drain when the drainage stops or when the amount decreases to 1-2 Tbsp (15-30 mL) during a 24-hour period. °How to care for your surgical drain °· Keep the skin around the drain dry and covered with a dressing at all times. °· Check your drain area every day for signs of infection. Check for: °? More redness, swelling, or pain. °? Pus or a bad smell. °? Cloudy drainage. °Follow instructions from your health care provider about how to take care of your drain and how to change your dressing. Change your dressing at least one time every day. Change it more often if needed to keep the dressing dry. Make sure you: °1. Gather your supplies, including: °? Tape. °? Germ-free cleaning solution (sterile saline). °? Split gauze drain sponge: 4 x 4 inches (10 x 10 cm). °? Gauze square: 4 x 4 inches (10 x 10 cm). °2. Wash your hands with soap and water before you change your dressing. If soap and water are not available, use hand sanitizer. °3. Remove the old dressing. Avoid using scissors to do that. °4. Use sterile saline to clean your skin around the drain. °5. Place the tube through the slit in a  drain sponge. Place the drain sponge so that it covers your wound. °6. Place the gauze square or another drain sponge on top of the drain sponge that is on the wound. Make sure the tube is between those layers. °7. Tape the dressing to your skin. °8. If you have an active bulb or Hemovac drain, tape the drainage tube to your skin 1-2 inches (2.5-5 cm) below the place where the tube enters your body. Taping keeps the tube from pulling on any stitches (sutures) that you have. °9. Wash your hands with soap and water. °10. Write down the color of your drainage and how often you change your dressing. ° °How to empty your active bulb or Hemovac drain °1. Make sure that you have a measuring cup that you can empty your drainage into. °2. Wash your hands with soap and water. If soap and water are not available, use hand sanitizer. °3. Gently move your fingers down the tube while squeezing very lightly. This is called stripping the tube. This clears any drainage, clots, or tissue from the tube. °?   Do not pull on the tube. °? You may need to strip the tube several times every day to keep the tube clear. °4. Open the bulb cap or the drain plug. Do not touch the inside of the cap or the bottom of the plug. °5. Empty all of the drainage into the measuring cup. °6. Compress the bulb or the container and replace the cap or the plug. To compress the bulb or the container, squeeze it firmly in the middle while you close the cap or plug the container. °7. Write down the amount of drainage that you have in each 24-hour period. If you have less than 2 Tbsp (30 mL) of drainage during 24 hours, contact your health care provider. °8. Flush the drainage down the toilet. °9. Wash your hands with soap and water. °Contact a health care provider if: °· You have more redness, swelling, or pain around your drain area. °· The amount of drainage that you have is increasing instead of decreasing. °· You have pus or a bad smell coming from your drain  area. °· You have a fever. °· You have drainage that is cloudy. °· There is a sudden stop or a sudden decrease in the amount of drainage that you have. °· Your tube falls out. °· Your active drain does not stay compressed after you empty it. °This information is not intended to replace advice given to you by your health care provider. Make sure you discuss any questions you have with your health care provider. °Document Released: 03/17/2000 Document Revised: 08/26/2015 Document Reviewed: 10/07/2014 °Elsevier Interactive Patient Education © 2018 [employer] ° °

## 2018-01-01 NOTE — Care Management Note (Signed)
Case Management Note  Patient Details  Name: Lisa Thornton MRN: [id number] Date of Birth: May 02, 1955  Subjective/Objective:    S/p Mastectomy. Has JP drains. Lives with sister.   [facility] Declines list of other providers in area.               Action/Plan: HH RN ordered for f/u and help with drain management. Patient has no preference on agencies. Referral given to [facility]. Dr Arnoldo Morale will be following [place] orders.   Expected Discharge Date:  01/01/18               Expected Discharge Plan:  [place]  In-House Referral:     Discharge planning Services  CM Consult  Post Acute Care Choice:  Home Health Choice offered to:  Patient  DME Arranged:    DME Agency:     HH Arranged:  RN [place] Agency:  [facility]  Status of Service:  Completed, signed off  If discussed at [place] of Stay Meetings, dates discussed:    Additional Comments:  Kellin Bartling, Chauncey [place], RN 01/01/2018, 11:03 AM

## 2018-01-03 ENCOUNTER — Other Ambulatory Visit (HOSPITAL_COMMUNITY): Payer: Medicaid Other

## 2018-01-03 ENCOUNTER — Ambulatory Visit (HOSPITAL_COMMUNITY): Payer: Medicaid Other

## 2018-01-08 ENCOUNTER — Ambulatory Visit (INDEPENDENT_AMBULATORY_CARE_PROVIDER_SITE_OTHER): Payer: Self-pay | Admitting: General Surgery

## 2018-01-08 ENCOUNTER — Encounter: Payer: Self-pay | Admitting: General Surgery

## 2018-01-08 VITALS — BP 169/78 | HR 82 | Temp 97.3°F | Resp 16 | Wt 154.0 lb

## 2018-01-08 DIAGNOSIS — Z09 Encounter for follow-up examination after completed treatment for conditions other than malignant neoplasm: Secondary | ICD-10-CM

## 2018-01-08 NOTE — Progress Notes (Signed)
Subjective:     Lisa Thornton  Status post left modified radical mastectomy.  Doing well.  Has no complaints.  Minimal drainage noted in JP drain. Objective:    BP (!) 169/78 (BP Location: Right Arm, Patient Position: Sitting, Cuff Size: Normal)   Pulse 82   Temp (!) 97.3 F (36.3 C) (Temporal)   Resp 16   Wt 154 lb (69.9 kg)   BMI 26.43 kg/m   General:  alert, cooperative and no distress  Left breast incision healing well.  Staples removed, Steri-Strips applied.  JP drain removed. Final pathology reviewed with patient.  8 lymph nodes negative for metastatic disease.     Assessment:    Doing well postoperatively.    Plan:   Follow-up wound check in 2 weeks.

## 2018-01-10 ENCOUNTER — Other Ambulatory Visit (HOSPITAL_COMMUNITY): Payer: Medicaid Other

## 2018-01-10 ENCOUNTER — Ambulatory Visit (HOSPITAL_COMMUNITY): Payer: Medicaid Other

## 2018-01-11 ENCOUNTER — Inpatient Hospital Stay (HOSPITAL_COMMUNITY): Payer: Medicaid Other | Attending: Hematology

## 2018-01-11 ENCOUNTER — Other Ambulatory Visit (HOSPITAL_COMMUNITY): Payer: Medicaid Other

## 2018-01-11 DIAGNOSIS — Z79899 Other long term (current) drug therapy: Secondary | ICD-10-CM | POA: Insufficient documentation

## 2018-01-11 DIAGNOSIS — I10 Essential (primary) hypertension: Secondary | ICD-10-CM | POA: Insufficient documentation

## 2018-01-11 DIAGNOSIS — Z9012 Acquired absence of left breast and nipple: Secondary | ICD-10-CM | POA: Insufficient documentation

## 2018-01-11 DIAGNOSIS — Z170 Estrogen receptor positive status [ER+]: Secondary | ICD-10-CM | POA: Diagnosis not present

## 2018-01-11 DIAGNOSIS — C50412 Malignant neoplasm of upper-outer quadrant of left female breast: Secondary | ICD-10-CM | POA: Diagnosis not present

## 2018-01-11 DIAGNOSIS — G620 Drug-induced polyneuropathy: Secondary | ICD-10-CM | POA: Diagnosis not present

## 2018-01-11 LAB — CBC WITH DIFFERENTIAL/PLATELET
Abs Immature Granulocytes: 0.02 10*3/uL (ref 0.00–0.07)
BASOS ABS: 0 10*3/uL (ref 0.0–0.1)
Basophils Relative: 0 %
EOS PCT: 5 %
Eosinophils Absolute: 0.3 10*3/uL (ref 0.0–0.5)
HCT: 31.3 % — ABNORMAL LOW (ref 36.0–46.0)
Hemoglobin: 10.7 g/dL — ABNORMAL LOW (ref 12.0–15.0)
Immature Granulocytes: 0 %
Lymphocytes Relative: 18 %
Lymphs Abs: 1.1 10*3/uL (ref 0.7–4.0)
MCH: 32.9 pg (ref 26.0–34.0)
MCHC: 34.2 g/dL (ref 30.0–36.0)
MCV: 96.3 fL (ref 80.0–100.0)
Monocytes Absolute: 0.6 10*3/uL (ref 0.1–1.0)
Monocytes Relative: 9 %
NEUTROS PCT: 68 %
NRBC: 0 % (ref 0.0–0.2)
Neutro Abs: 4.2 10*3/uL (ref 1.7–7.7)
PLATELETS: 244 10*3/uL (ref 150–400)
RBC: 3.25 MIL/uL — AB (ref 3.87–5.11)
RDW: 12 % (ref 11.5–15.5)
WBC: 6.2 10*3/uL (ref 4.0–10.5)

## 2018-01-11 LAB — COMPREHENSIVE METABOLIC PANEL
ALT: 17 U/L (ref 0–44)
AST: 25 U/L (ref 15–41)
Albumin: 3.6 g/dL (ref 3.5–5.0)
Alkaline Phosphatase: 49 U/L (ref 38–126)
Anion gap: 8 (ref 5–15)
BUN: 10 mg/dL (ref 8–23)
CHLORIDE: 107 mmol/L (ref 98–111)
CO2: 25 mmol/L (ref 22–32)
Calcium: 9.2 mg/dL (ref 8.9–10.3)
Creatinine, Ser: 0.83 mg/dL (ref 0.44–1.00)
GFR calc Af Amer: >60 mL/min (ref >60)
Glucose, Bld: 125 mg/dL — ABNORMAL HIGH (ref 70–99)
POTASSIUM: 3.7 mmol/L (ref 3.5–5.1)
SODIUM: 140 mmol/L (ref 135–145)
Total Bilirubin: 0.4 mg/dL (ref 0.3–1.2)
Total Protein: 7.2 g/dL (ref 6.5–8.1)

## 2018-01-15 ENCOUNTER — Other Ambulatory Visit: Payer: Self-pay

## 2018-01-15 ENCOUNTER — Encounter (HOSPITAL_COMMUNITY): Payer: Self-pay | Admitting: Hematology

## 2018-01-15 ENCOUNTER — Other Ambulatory Visit: Payer: Self-pay | Admitting: Pharmacist

## 2018-01-15 ENCOUNTER — Inpatient Hospital Stay (HOSPITAL_COMMUNITY): Payer: Medicaid Other | Attending: Hematology | Admitting: Hematology

## 2018-01-15 VITALS — BP 157/68 | HR 74 | Temp 98.4°F | Resp 18 | Wt 154.2 lb

## 2018-01-15 DIAGNOSIS — Z79899 Other long term (current) drug therapy: Secondary | ICD-10-CM | POA: Insufficient documentation

## 2018-01-15 DIAGNOSIS — G629 Polyneuropathy, unspecified: Secondary | ICD-10-CM | POA: Insufficient documentation

## 2018-01-15 DIAGNOSIS — Z23 Encounter for immunization: Secondary | ICD-10-CM | POA: Diagnosis not present

## 2018-01-15 DIAGNOSIS — I10 Essential (primary) hypertension: Secondary | ICD-10-CM | POA: Diagnosis not present

## 2018-01-15 DIAGNOSIS — G620 Drug-induced polyneuropathy: Secondary | ICD-10-CM

## 2018-01-15 DIAGNOSIS — Z9012 Acquired absence of left breast and nipple: Secondary | ICD-10-CM | POA: Diagnosis not present

## 2018-01-15 DIAGNOSIS — C50412 Malignant neoplasm of upper-outer quadrant of left female breast: Secondary | ICD-10-CM | POA: Insufficient documentation

## 2018-01-15 DIAGNOSIS — Z170 Estrogen receptor positive status [ER+]: Secondary | ICD-10-CM | POA: Insufficient documentation

## 2018-01-15 MED ORDER — INFLUENZA VAC SPLIT QUAD 0.5 ML IM SUSY
0.5000 mL | PREFILLED_SYRINGE | Freq: Once | INTRAMUSCULAR | Status: AC
  Administered 2018-01-15: 0.5 mL via INTRAMUSCULAR

## 2018-01-15 MED ORDER — INFLUENZA VAC SPLIT QUAD 0.5 ML IM SUSY
PREFILLED_SYRINGE | INTRAMUSCULAR | Status: AC
  Filled 2018-01-15: qty 0.5

## 2018-01-15 NOTE — Progress Notes (Signed)
Lisa Thornton, [place] [id number]   CLINIC:  Medical Oncology/Hematology  PCP:  Sandria Manly [facility] [address] [place] [address] [phone]   REASON FOR VISIT: Follow-up for left breast cancer  CURRENT THERAPY: S/P left mastectomy  BRIEF ONCOLOGIC HISTORY:    Breast cancer of upper-outer quadrant of left female breast ([place])   06/22/2017 Initial Diagnosis    Breast cancer of upper-outer quadrant of left female breast ([place])    06/22/2017 Cancer Staging    Staging form: Breast, AJCC 8th Edition - Clinical stage from 06/22/2017: Stage IIIB (cT3, cN1, cM0, G3, ER+, PR-, HER2-) - Signed by Derek Jack, MD on 06/22/2017    06/29/2017 Imaging    CT chest showing left axillary adenopathy, 1.2 cm anterior carinal adenopathy, left subpectoral lymph node subcentimeter  06/25/2017 2D echocardiogram with ejection fraction of 55-60%    07/16/2017 -  Chemotherapy    The patient had DOXOrubicin (ADRIAMYCIN) chemo injection 110 mg, 60 mg/m2 = 110 mg, Intravenous,  Once, 4 of 4 cycles Administration: 110 mg (07/18/2017), 110 mg (08/01/2017), 110 mg (08/20/2017), 110 mg (09/03/2017) palonosetron (ALOXI) injection 0.25 mg, 0.25 mg, Intravenous,  Once, 4 of 4 cycles Administration: 0.25 mg (07/18/2017), 0.25 mg (08/01/2017), 0.25 mg (08/20/2017), 0.25 mg (09/03/2017) pegfilgrastim-cbqv (UDENYCA) injection 6 mg, 6 mg, Subcutaneous, Once, 4 of 4 cycles Administration: 6 mg (07/19/2017), 6 mg (08/02/2017), 6 mg (08/22/2017), 6 mg (09/05/2017) cyclophosphamide (CYTOXAN) 1,100 mg in sodium chloride 0.9 % 250 mL chemo infusion, 600 mg/m2 = 1,100 mg, Intravenous,  Once, 4 of 4 cycles Administration: 1,100 mg (07/18/2017), 1,100 mg (08/01/2017), 1,100 mg (08/20/2017), 1,100 mg (09/03/2017) PACLitaxel (TAXOL) 120 mg in sodium chloride 0.9 % 250 mL chemo infusion (</= '80mg'$ /m2), 64 mg/m2 = 120 mg (80 % of original dose 80 mg/m2), Intravenous,  Once, 12 of 12 cycles Dose  modification: 64 mg/m2 (80 % of original dose 80 mg/m2, Cycle 5, Reason: Provider Judgment), 60 mg/m2 (75 % of original dose 80 mg/m2, Cycle 15, Reason: Other (see comments)), 40 mg/m2 (50 % of original dose 80 mg/m2, Cycle 16, Reason: Other (see comments)) Administration: 120 mg (09/17/2017), 144 mg (09/24/2017), 144 mg (10/03/2017), 144 mg (10/11/2017), 144 mg (10/18/2017), 144 mg (10/25/2017), 144 mg (11/01/2017), 144 mg (11/08/2017), 144 mg (11/15/2017), 144 mg (11/22/2017), 108 mg (11/29/2017), 72 mg (12/06/2017) fosaprepitant (EMEND) 150 mg, dexamethasone (DECADRON) 12 mg in sodium chloride 0.9 % 145 mL IVPB, , Intravenous,  Once, 4 of 4 cycles Administration:  (07/18/2017),  (08/01/2017),  (08/20/2017),  (09/03/2017)  for chemotherapy treatment.       CANCER STAGING: Cancer Staging Breast cancer of upper-outer quadrant of left female breast [facility]) Staging form: Breast, AJCC 8th Edition - Clinical stage from 06/22/2017: Stage IIIB (cT3, cN1, cM0, G3, ER+, PR-, HER2-) - Signed by Derek Jack, MD on 06/22/2017    INTERVAL HISTORY:  Lisa Thornton 62 y.o. female returns for routine follow-up for left breast cancer. She has mild numbness and tinglig in her hands that is constant. She does report fatigue that is improving. She has no other complaints at this time. She reports her appetite and her energy level are at 75% and she is having no problem eating and maintaining her weight. She denies any new pains. Denies any nausea, vomiting, or diarrhea. Denies any skin rashes or mouth sores. Her left mastectomy site is healing good with no redness or open areas.    REVIEW OF SYSTEMS:  Review of  Systems  Constitutional: Positive for fatigue.  Neurological: Positive for numbness.  All other systems reviewed and are negative.    PAST MEDICAL/SURGICAL HISTORY:  Past Medical History:  Diagnosis Date  . Cancer [facility])    left breast cancer  . Chest pain   . Hypertension    Past Surgical History:  Procedure  Laterality Date  . MASTECTOMY MODIFIED RADICAL Left 12/31/2017   Procedure: LEFT MODIFIED RADICAL MASTECTOMY;  Surgeon: Aviva Signs, MD;  Location: [facility];  Service: General;  Laterality: Left;  . PORTACATH PLACEMENT Right 06/27/2017   Procedure: INSERTION PORT-A-CATH;  Surgeon: Aviva Signs, MD;  Location: [facility];  Service: General;  Laterality: Right;     SOCIAL HISTORY:  Social History   Socioeconomic History  . Marital status: Divorced    Spouse name: Not on file  . Number of children: Not on file  . Years of education: Not on file  . Highest education level: Not on file  Occupational History  . Not on file  Social Needs  . Financial resource strain: Somewhat hard  . Food insecurity:    Worry: Never true    Inability: Never true  . Transportation needs:    Medical: No    Non-medical: No  Tobacco Use  . Smoking status: Never Smoker  . Smokeless tobacco: Never Used  Substance and Sexual Activity  . Alcohol use: No  . Drug use: No  . Sexual activity: Not Currently  Lifestyle  . Physical activity:    Days per week: 3 days    Minutes per session: 10 min  . Stress: Very much  Relationships  . Social connections:    Talks on phone: More than three times a week    Gets together: More than three times a week    Attends religious service: More than 4 times per year    Active member of club or organization: Yes    Attends meetings of clubs or organizations: Never    Relationship status: Divorced  . Intimate partner violence:    Fear of current or ex partner: Patient refused    Emotionally abused: Patient refused    Physically abused: Patient refused    Forced sexual activity: Patient refused  Other Topics Concern  . Not on file  Social History Narrative  . Not on file    FAMILY HISTORY:  Family History  Problem Relation Age of Onset  . Breast cancer Mother   . Thyroid disease Mother   . Heart disease Mother   . Heart attack Father   . Heart attack Sister     . Hypertension Sister   . Heart attack Brother   . Cancer Sister   . Stroke Brother   . Alzheimer's disease Maternal Aunt     CURRENT MEDICATIONS:  Outpatient Encounter Medications as of 01/15/2018  Medication Sig  . amLODipine (NORVASC) 5 MG tablet Take 1 tablet (5 mg total) by mouth daily.  Marland Kitchen HYDROcodone-acetaminophen (NORCO) 5-325 MG tablet Take 1 tablet by mouth every 4 (four) hours as needed for moderate pain.  . metoprolol tartrate (LOPRESSOR) 50 MG tablet Take 50 mg by mouth 2 (two) times daily.  . ondansetron (ZOFRAN) 8 MG tablet Take 1 tablet (8 mg total) by mouth 2 (two) times daily as needed. Start on the third day after chemotherapy.  . prochlorperazine (COMPAZINE) 10 MG tablet Take 1 tablet (10 mg total) by mouth every 6 (six) hours as needed (Nausea or vomiting).  . [EXPIRED] Influenza vac  split quadrivalent PF (FLUARIX) injection 0.5 mL    No facility-administered encounter medications on file as of 01/15/2018.     ALLERGIES:  No Known Allergies   PHYSICAL EXAM:  ECOG Performance status: 1  Vitals:   01/15/18 1132  BP: (!) 157/68  Pulse: 74  Resp: 18  Temp: 98.4 F (36.9 C)  SpO2: 99%   Filed Weights   01/15/18 1132  Weight: 154 lb 3.2 oz (69.9 kg)    Physical Exam  Constitutional: She is oriented to person, place, and time. She appears well-developed and well-nourished.  Cardiovascular: Normal rate, regular rhythm and normal heart sounds.  Pulmonary/Chest: Effort normal and breath sounds normal.  Musculoskeletal: Normal range of motion.  Neurological: She is alert and oriented to person, place, and time.  Skin: Skin is warm and dry.  Psychiatric: She has a normal mood and affect. Her behavior is normal. Judgment and thought content normal.  Breast: RIGHT-No palpable masses, no skin changes or nipple discharge, no adenopathy.              LEFT- mastectomy site-no redness, no swelling, no drainage. Steri strips in place.    LABORATORY DATA:  I  have reviewed the labs as listed.  CBC    Component Value Date/Time   WBC 6.2 01/11/2018 1400   RBC 3.25 (L) 01/11/2018 1400   HGB 10.7 (L) 01/11/2018 1400   HCT 31.3 (L) 01/11/2018 1400   PLT 244 01/11/2018 1400   MCV 96.3 01/11/2018 1400   MCH 32.9 01/11/2018 1400   MCHC 34.2 01/11/2018 1400   RDW 12.0 01/11/2018 1400   LYMPHSABS 1.1 01/11/2018 1400   MONOABS 0.6 01/11/2018 1400   EOSABS 0.3 01/11/2018 1400   BASOSABS 0.0 01/11/2018 1400   CMP Latest Ref Rng & Units 01/11/2018 01/01/2018 12/31/2017  Glucose 70 - 99 mg/dL 125(H) 143(H) 136(H)  BUN 8 - 23 mg/dL '10 14 14  '$ Creatinine 0.44 - 1.00 mg/dL 0.83 0.91 1.07(H)  Sodium 135 - 145 mmol/L 140 140 140  Potassium 3.5 - 5.1 mmol/L 3.7 3.9 3.9  Chloride 98 - 111 mmol/L 107 109 108  CO2 22 - 32 mmol/L '25 24 25  '$ Calcium 8.9 - 10.3 mg/dL 9.2 8.3(L) 8.3(L)  Total Protein 6.5 - 8.1 g/dL 7.2 - 6.1(L)  Total Bilirubin 0.3 - 1.2 mg/dL 0.4 - 0.4  Alkaline Phos 38 - 126 U/L 49 - 40  AST 15 - 41 U/L 25 - 20  ALT 0 - 44 U/L 17 - 14        ASSESSMENT & PLAN:   Breast cancer of upper-outer quadrant of left female breast ([place]) 1.  Poorly differentiated left breast cancer stage IIIb, (cT3cN1), ER positive, PR and HER-2 negative: - On 06/12/2017, left breast biopsy with sarcomatoid features, left axillary lymph node biopsy negative.  2D echo shows EF of 55-60%. - CT of the chest showed subcarinal adenopathy, PET CT scan negative for metastatic disease. -4 cycles of dose dense Adriamycin and cyclophosphamide from 07/18/2017 through 09/03/2017. - Weekly paclitaxel started on 09/17/2017.  Completed on 12/06/2017. - She underwent left modified radical mastectomy on 12/31/2017. -I have reviewed pathology report with the patient in detail.  This showed 2.3 cm poorly differentiated invasive ductal carcinoma with sarcomatoid changes, resection margins negative, high-grade DCIS, no skin involvement, 0 out of 8 lymph nodes positive, negative for  lymphovascular or perineural invasion.  Pathological staging is YPT2YPN0. - Receptors were rechecked on mastectomy specimen.  Her to became positive by  [facility].  This was initially negative by FISH on biopsy specimen.  ER was negative on mastectomy specimen.  It was 60% positive on biopsy specimen.  Ki-67 has decreased to 30% from 80% on biopsy.  -I will reach out to our pathologist to confirm this prior to committing her for a year of HER-2 directed therapy. -We will also obtain a baseline 2D echocardiogram.  We will see her back in 2 weeks for follow-up.  2.  Hypertension: She is continuing metoprolol 50 mg twice daily and Norvasc 5 mg daily.  Blood pressure is well controlled.  3.  Neuropathy: -She has developed neuropathy towards the end of her paclitaxel.  She is having numbness in the fingertips without pain.  We will closely monitor it.        Orders placed this encounter:  Orders Placed This Encounter  Procedures  . ECHOCARDIOGRAM COMPLETE      Derek Jack, [place] [phone]

## 2018-01-15 NOTE — Progress Notes (Signed)
Lisa Thornton tolerated Influenza vaccine well without complaints or incident

## 2018-01-15 NOTE — Patient Instructions (Signed)
[facility] Discharge Instructions  Follow up in 2 weeks with labs    Thank you for choosing [facility] at [facility] to provide your oncology and hematology care.  To afford each patient quality time with our provider, please arrive at least 15 minutes before your scheduled appointment time.   If you have a lab appointment with the Cancer Center please come in thru the  Main Entrance and check in at the main information desk  You need to re-schedule your appointment should you arrive 10 or more minutes late.  We strive to give you quality time with our providers, and arriving late affects you and other patients whose appointments are after yours.  Also, if you no show three or more times for appointments you may be dismissed from the clinic at the providers discretion.     Again, thank you for choosing [facility].  Our hope is that these requests will decrease the amount of time that you wait before being seen by our physicians.       _____________________________________________________________  Should you have questions after your visit to [facility], please contact our office at [phone] between the hours of 8:00 a.m. and 4:30 p.m.  Voicemails left after 4:00 p.m. will not be returned until the following business day.  For prescription refill requests, have your pharmacy contact our office and allow 72 hours.    Cancer Center Support Programs:   > Cancer Support Group  2nd Tuesday of the month 1pm-2pm, Journey Room    

## 2018-01-15 NOTE — Assessment & Plan Note (Signed)
1.  Poorly differentiated left breast cancer stage IIIb, (cT3cN1), ER positive, PR and HER-2 negative: - On 06/12/2017, left breast biopsy with sarcomatoid features, left axillary lymph node biopsy negative.  2D echo shows EF of 55-60%. - CT of the chest showed subcarinal adenopathy, PET CT scan negative for metastatic disease. -4 cycles of dose dense Adriamycin and cyclophosphamide from 07/18/2017 through 09/03/2017. - Weekly paclitaxel started on 09/17/2017.  Completed on 12/06/2017. - She underwent left modified radical mastectomy on 12/31/2017. -I have reviewed pathology report with the patient in detail.  This showed 2.3 cm poorly differentiated invasive ductal carcinoma with sarcomatoid changes, resection margins negative, high-grade DCIS, no skin involvement, 0 out of 8 lymph nodes positive, negative for lymphovascular or perineural invasion.  Pathological staging is YPT2YPN0. - Receptors were rechecked on mastectomy specimen.  Her to became positive by IHC.  This was initially negative by [place] on biopsy specimen.  ER was negative on mastectomy specimen.  It was 60% positive on biopsy specimen.  Ki-67 has decreased to 30% from 80% on biopsy.  -I will reach out to our pathologist to confirm this prior to committing her for a year of HER-2 directed therapy. -We will also obtain a baseline 2D echocardiogram.  We will see her back in 2 weeks for follow-up.  2.  Hypertension: She is continuing metoprolol 50 mg twice daily and Norvasc 5 mg daily.  Blood pressure is well controlled.  3.  Neuropathy: -She has developed neuropathy towards the end of her paclitaxel.  She is having numbness in the fingertips without pain.  We will closely monitor it.

## 2018-01-17 ENCOUNTER — Other Ambulatory Visit (HOSPITAL_COMMUNITY): Payer: Medicaid Other

## 2018-01-17 ENCOUNTER — Ambulatory Visit (HOSPITAL_COMMUNITY): Payer: Medicaid Other | Admitting: Hematology

## 2018-01-17 ENCOUNTER — Ambulatory Visit (HOSPITAL_COMMUNITY): Payer: Medicaid Other

## 2018-01-21 ENCOUNTER — Ambulatory Visit (HOSPITAL_COMMUNITY)
Admission: RE | Admit: 2018-01-21 | Discharge: 2018-01-21 | Disposition: A | Discharge status: Home / Self Care | Payer: Medicaid Other | Source: Ambulatory Visit | Attending: Nurse Practitioner | Admitting: Nurse Practitioner

## 2018-01-21 DIAGNOSIS — Z170 Estrogen receptor positive status [ER+]: Secondary | ICD-10-CM | POA: Diagnosis present

## 2018-01-21 DIAGNOSIS — I071 Rheumatic tricuspid insufficiency: Secondary | ICD-10-CM | POA: Diagnosis not present

## 2018-01-21 DIAGNOSIS — I4891 Unspecified atrial fibrillation: Secondary | ICD-10-CM | POA: Insufficient documentation

## 2018-01-21 DIAGNOSIS — C50412 Malignant neoplasm of upper-outer quadrant of left female breast: Secondary | ICD-10-CM | POA: Diagnosis not present

## 2018-01-21 NOTE — Progress Notes (Signed)
*  PRELIMINARY RESULTS* Echocardiogram 2D Echocardiogram has been performed.  Lisa Thornton 01/21/2018, 11:03 AM

## 2018-01-22 ENCOUNTER — Encounter: Payer: Self-pay | Admitting: General Surgery

## 2018-01-22 ENCOUNTER — Ambulatory Visit (INDEPENDENT_AMBULATORY_CARE_PROVIDER_SITE_OTHER): Payer: Self-pay | Admitting: General Surgery

## 2018-01-22 VITALS — BP 145/68 | HR 58 | Temp 97.3°F | Resp 16 | Wt 155.6 lb

## 2018-01-22 DIAGNOSIS — Z09 Encounter for follow-up examination after completed treatment for conditions other than malignant neoplasm: Secondary | ICD-10-CM

## 2018-01-22 NOTE — Progress Notes (Signed)
Subjective:     Lisa Thornton  Here for follow-up wound check.  Patient has no complaints. Objective:    BP (!) 145/68 (BP Location: Left Arm, Patient Position: Sitting, Cuff Size: Normal)   Pulse (!) 58   Temp (!) 97.3 F (36.3 C) (Temporal)   Resp 16   Wt 155 lb 9.6 oz (70.6 kg)   BMI 26.71 kg/m   General:  alert, cooperative and no distress  Left mastectomy incision well-healed.     Assessment:    Doing well postoperatively.    Plan:   Continue follow-up with oncology.  Follow-up here as needed.

## 2018-01-31 ENCOUNTER — Telehealth (HOSPITAL_COMMUNITY): Payer: Self-pay | Admitting: *Deleted

## 2018-01-31 ENCOUNTER — Inpatient Hospital Stay (HOSPITAL_COMMUNITY): Payer: Medicaid Other

## 2018-01-31 ENCOUNTER — Inpatient Hospital Stay (HOSPITAL_BASED_OUTPATIENT_CLINIC_OR_DEPARTMENT_OTHER): Payer: Medicaid Other | Admitting: Hematology

## 2018-01-31 ENCOUNTER — Other Ambulatory Visit (HOSPITAL_COMMUNITY): Payer: Self-pay | Admitting: Nurse Practitioner

## 2018-01-31 ENCOUNTER — Other Ambulatory Visit: Payer: Self-pay

## 2018-01-31 ENCOUNTER — Encounter (HOSPITAL_COMMUNITY): Payer: Self-pay | Admitting: Hematology

## 2018-01-31 VITALS — BP 113/83 | HR 70 | Temp 98.8°F | Resp 16 | Wt 154.0 lb

## 2018-01-31 DIAGNOSIS — R350 Frequency of micturition: Secondary | ICD-10-CM | POA: Diagnosis not present

## 2018-01-31 DIAGNOSIS — G620 Drug-induced polyneuropathy: Secondary | ICD-10-CM

## 2018-01-31 DIAGNOSIS — Z170 Estrogen receptor positive status [ER+]: Secondary | ICD-10-CM | POA: Diagnosis not present

## 2018-01-31 DIAGNOSIS — D6481 Anemia due to antineoplastic chemotherapy: Secondary | ICD-10-CM

## 2018-01-31 DIAGNOSIS — I10 Essential (primary) hypertension: Secondary | ICD-10-CM

## 2018-01-31 DIAGNOSIS — C50412 Malignant neoplasm of upper-outer quadrant of left female breast: Secondary | ICD-10-CM | POA: Diagnosis not present

## 2018-01-31 DIAGNOSIS — T451X5A Adverse effect of antineoplastic and immunosuppressive drugs, initial encounter: Secondary | ICD-10-CM

## 2018-01-31 DIAGNOSIS — N390 Urinary tract infection, site not specified: Secondary | ICD-10-CM

## 2018-01-31 LAB — COMPREHENSIVE METABOLIC PANEL
ALT: 19 U/L (ref 0–44)
ANION GAP: 8 (ref 5–15)
AST: 25 U/L (ref 15–41)
Albumin: 3.9 g/dL (ref 3.5–5.0)
Alkaline Phosphatase: 51 U/L (ref 38–126)
BILIRUBIN TOTAL: 0.5 mg/dL (ref 0.3–1.2)
BUN: 10 mg/dL (ref 8–23)
CHLORIDE: 106 mmol/L (ref 98–111)
CO2: 25 mmol/L (ref 22–32)
Calcium: 9.3 mg/dL (ref 8.9–10.3)
Creatinine, Ser: 0.82 mg/dL (ref 0.44–1.00)
Glucose, Bld: 131 mg/dL — ABNORMAL HIGH (ref 70–99)
POTASSIUM: 3.9 mmol/L (ref 3.5–5.1)
Sodium: 139 mmol/L (ref 135–145)
TOTAL PROTEIN: 7.4 g/dL (ref 6.5–8.1)

## 2018-01-31 LAB — URINALYSIS, ROUTINE W REFLEX MICROSCOPIC
BILIRUBIN URINE: NEGATIVE
Glucose, UA: NEGATIVE mg/dL
KETONES UR: NEGATIVE mg/dL
NITRITE: POSITIVE — AB
PH: 5 (ref 5.0–8.0)
Protein, ur: 30 mg/dL — AB
SPECIFIC GRAVITY, URINE: 1.017 (ref 1.005–1.030)

## 2018-01-31 LAB — CBC WITH DIFFERENTIAL/PLATELET
Abs Immature Granulocytes: 0.02 10*3/uL (ref 0.00–0.07)
BASOS PCT: 1 %
Basophils Absolute: 0 10*3/uL (ref 0.0–0.1)
EOS ABS: 0.2 10*3/uL (ref 0.0–0.5)
EOS PCT: 5 %
HCT: 33.8 % — ABNORMAL LOW (ref 36.0–46.0)
Hemoglobin: 11.2 g/dL — ABNORMAL LOW (ref 12.0–15.0)
IMMATURE GRANULOCYTES: 0 %
Lymphocytes Relative: 26 %
Lymphs Abs: 1.3 10*3/uL (ref 0.7–4.0)
MCH: 30.9 pg (ref 26.0–34.0)
MCHC: 33.1 g/dL (ref 30.0–36.0)
MCV: 93.1 fL (ref 80.0–100.0)
MONOS PCT: 8 %
Monocytes Absolute: 0.4 10*3/uL (ref 0.1–1.0)
NRBC: 0 % (ref 0.0–0.2)
Neutro Abs: 3 10*3/uL (ref 1.7–7.7)
Neutrophils Relative %: 60 %
PLATELETS: 218 10*3/uL (ref 150–400)
RBC: 3.63 MIL/uL — ABNORMAL LOW (ref 3.87–5.11)
RDW: 11.7 % (ref 11.5–15.5)
WBC: 5.1 10*3/uL (ref 4.0–10.5)

## 2018-01-31 MED ORDER — CIPROFLOXACIN HCL 500 MG PO TABS: 500.0000 mg | ORAL_TABLET | Freq: Two times a day (BID) | ORAL | 0 refills | Status: DC

## 2018-01-31 NOTE — Telephone Encounter (Signed)
Pt aware that an antibiotic has been sent into her phar,acy for her UTI. Pt verbalized understanding.

## 2018-01-31 NOTE — Patient Instructions (Signed)
[facility] at [facility] Discharge Instructions  Follow up in 4 weeks with treatment and labs.    Thank you for choosing [facility] at [facility] to provide your oncology and hematology care.  To afford each patient quality time with our provider, please arrive at least 15 minutes before your scheduled appointment time.   If you have a lab appointment with the Cancer Center please come in thru the  Main Entrance and check in at the main information desk  You need to re-schedule your appointment should you arrive 10 or more minutes late.  We strive to give you quality time with our providers, and arriving late affects you and other patients whose appointments are after yours.  Also, if you no show three or more times for appointments you may be dismissed from the clinic at the providers discretion.     Again, thank you for choosing [facility].  Our hope is that these requests will decrease the amount of time that you wait before being seen by our physicians.       _____________________________________________________________  Should you have questions after your visit to [facility], please contact our office at [phone] between the hours of 8:00 a.m. and 4:30 p.m.  Voicemails left after 4:00 p.m. will not be returned until the following business day.  For prescription refill requests, have your pharmacy contact our office and allow 72 hours.    Cancer Center Support Programs:   > Cancer Support Group  2nd Tuesday of the month 1pm-2pm, Journey Room    

## 2018-01-31 NOTE — Patient Instructions (Signed)
[facility] Chemotherapy Teaching   You have been diagnosed with left breast cancer. You have completed the first part of chemotherapy and you will begin your second part where you will receive Herceptin (trastuzumab).  You will receive it every 3 weeks.  You will need to have an echocardiogram performed every 3 months during treatment.  You will see the doctor regularly throughout treatment.  We monitor your lab work prior to every treatment. The doctor monitors your response to treatment by the way you are feeling, your blood work, and scans periodically.  There will be wait times while you are here for treatment.  It will take about 30 minutes to 1 hour for your lab work to result.  Then there will be wait times while pharmacy mixes your medications.   You will receive the the following premedications prior to each of the Taxol treatments: Tylenol and Benadryl: helps prevent reaction to chemotherapy.     Trastuzumab-xxxx (Herceptin, KanjintiT)  About This Drug Trastuzumab-xxxx is used to treat cancer. It is given in the vein (IV)  Possible Side Effects . Bone marrow suppression. This is a decrease in the number of white blood cells, red blood cells, and platelets. This may raise your risk of infection, make you tired and weak (fatigue), and raise your risk of bleeding. . Congestive heart failure - your heart has less ability to pump blood properly . Soreness of the mouth and throat. You may have red areas, white patches, or sores that hurt . Nausea . Diarrhea (loose bowel movements) . Fever . Chills . Tiredness . Infection . Inflammation of nasal passages and throat . Changes in the way food and drinks taste . Weight loss . Headache . Trouble sleeping . Cough . Upper respiratory infection . Rash  Note: Each of the side effects above was reported in 10% or greater of patients treated with trastuzumab-xxxx. Not all possible side effects are included  above.  Warnings and Precautions . Changes in the tissue of the heart and heart function. Some changes may happen that can cause your heart to have less ability to pump blood. This drug may also increase your risk of heart attack. . Serious and life-threatening lung problems such as inflammation (swelling) and scarring of the lungs which makes breathing difficult. . While you are getting this drug in your vein (IV), you may have a reaction to the drug. Sometimes you may be given medication to stop or lessen these side effects. Your nurse will check you closely for these signs: fever or shaking chills, flushing, facial swelling, feeling dizzy, headache, trouble breathing, rash, itching, chest tightness, or chest pain. These reactions may happen after your infusion. If this happens, call 911 for emergency care. . Severe decrease in the number of white blood cells. This may raise your risk of infection which may be life-threatening.  Note: Some of the side effects above are very rare. If you have concerns and/or questions, please discuss them with your medical team.  Important Information . This drug may be present in the saliva, tears, sweat, urine, stool, vomit, semen, and vaginal secretions. Talk to your doctor and/or your nurse about the necessary precautions to take during this time.  Treating Side Effects . Manage tiredness by pacing your activities for the day. . Be sure to include periods of rest between energy-draining activities. . To decrease the risk of infection, wash your hands regularly. . Avoid close contact with people who have a cold, the flu,  or other infections. . Take your temperature as your doctor or nurse tells you, and whenever you feel like you may have a fever. . To help decrease the risk of bleeding, use a soft toothbrush. Check with your nurse before using dental floss. . Be very careful when using knives or tools. . Use an electric shaver instead of a  razor. . Drink plenty of fluids (a minimum of eight glasses per day is recommended). . To help with nausea, eat small, frequent meals instead of three large meals a day. Choose foods and drinks that are at room temperature. Ask your nurse or doctor about other helpful tips and medicine that is available to help stop or lessen these symptoms. . Mouth care is very important. Your mouth care should consist of routine, gentle cleaning of your teeth or dentures and rinsing your mouth with a mixture of 1/2 teaspoon of salt in 8 ounces of water or 1/2 teaspoon of baking soda in 8 ounces of water. This should be done at least after each meal and at bedtime. . If you have mouth sores, avoid mouthwash that has alcohol. Also avoid alcohol and smoking because they can bother your mouth and throat. . If you throw up or have loose bowel movements, you should drink more fluids so that you do not become dehydrated (lack of water in the body from losing too much fluid). . If you have diarrhea, eat low-fiber foods that are high in protein and calories and avoid foods that can irritate your digestive tracts or lead to cramping. . Ask your nurse or doctor about medicine that can lessen or stop your diarrhea. . To help with weight loss, drink fluids that contribute calories (whole milk, juice, soft drinks, sweetened beverages, milkshakes, and nutritional supplements) instead of water. . Include a source of protein at every meal and snack, such as meat, poultry, fish, dry beans, tofu, eggs, nuts, milk, yogurt, cheese, ice cream, pudding, and nutritional supplements. . If you get a rash do not put anything on it unless your doctor or nurse says you may. Keep the area around the rash clean and dry. Ask your doctor for medicine if your rash bothers you. Marland Kitchen Keeping your pain under control is important to your well-being. Please tell your doctor or nurse if you are experiencing pain. . If you are having trouble  sleeping, talk to your nurse or doctor on tips to help you sleep better . Infusion reactions may occur after your infusion. If this happens, call 911 for emergency care.  Food and Drug Interactions . There are no known interactions of trastuzumab-xxxx with food. . This drug may interact with other medicines. Tell your doctor and pharmacist about all the prescription and over-the-counter medicines and dietary supplements (vitamins, minerals, herbs and others) that you are taking at this time. Also, check with your doctor or pharmacist before starting any new prescription or over-the-counter medicines, or dietary supplements to make sure that there are no interactions.  When to Call the Doctor Call your doctor or nurse if you have any of these symptoms and/or any new or unusual symptoms: . Fever of 100.4 F (38 C) or higher . Chills . Tiredness that interferes with your daily activities . Trouble falling or staying asleep . Feeling dizzy or lightheaded . A headache that does not go away . Easy bleeding or bruising . Wheezing or trouble breathing or dry cough . Coughing up yellow, green, or bloody mucus . Feeling that your heart  is beating in a fast or not normal way (palpitations) . Chest pain or symptoms of a heart attack. Most heart attacks involve pain in the center of the chest that lasts more than a few minutes. The pain may go away and come back, or it can be constant. It can feel like pressure, squeezing, fullness, or pain. Sometimes pain is felt in one or both arms, the back, neck, jaw, or stomach. If any of these symptoms last 2 minutes, call 911. . Pain in your mouth or throat that makes it hard to eat or drink . Nausea that stops you from eating or drinking and/or is not relieved by prescribed medicines . Diarrhea, 4 times in one day or diarrhea with lack of strength or a feeling of being dizzy . Lasting loss of appetite or rapid weight loss of five pounds in a week .  Swelling of arms, hand, legs, and/or feet  SELF CARE ACTIVITIES WHILE ON CHEMOTHERAPY:  Hydration Increase your fluid intake 48 hours prior to treatment and drink at least 8 to 12 cups (64 ounces) of water/decaffeinated beverages per day after treatment. You can still have your cup of coffee or soda but these beverages do not count as part of your 8 to 12 cups that you need to drink daily. No alcohol intake.  Medications Continue taking your normal prescription medication as prescribed.  If you start any new herbal or new supplements please let us know first to make sure it is safe.  Mouth Care Have teeth cleaned professionally before starting treatment. Keep dentures and partial plates clean. Use soft toothbrush and do not use mouthwashes that contain alcohol. Biotene is a good mouthwash that is available at most pharmacies or may be ordered by calling [phone]. Use warm salt water gargles (1 teaspoon salt per 1 quart warm water) before and after meals and at bedtime. Or you may rinse with 2 tablespoons of three-percent hydrogen peroxide mixed in eight ounces of water. If you are still having problems with your mouth or sores in your mouth please call the clinic. If you need dental work, please let the doctor know before you go for your appointment so that we can coordinate the best possible time for you in regards to your chemo regimen. You need to also let your dentist know that you are actively taking chemo. We may need to do labs prior to your dental appointment.  Skin Care Always use sunscreen that has not expired and with SPF (Sun Protection Factor) of 50 or higher. Wear hats to protect your head from the sun. Remember to use sunscreen on your hands, ears, face, & feet.  Use good moisturizing lotions such as udder cream, eucerin, or even Vaseline. Some chemotherapies can cause dry skin, color changes in your skin and nails.    . Avoid long, hot showers or baths. . Use gentle,  fragrance-free soaps and laundry detergent. . Use moisturizers, preferably creams or ointments rather than lotions because the thicker consistency is better at preventing skin dehydration. Apply the cream or ointment within 15 minutes of showering. Reapply moisturizer at night, and moisturize your hands every time after you wash them.  Hair Loss (if your doctor says your hair will fall out)  . If your doctor says that your hair is likely to fall out, decide before you begin chemo whether you want to wear a wig. You may want to shop before treatment to match your hair color. . Hats, turbans, and scarves  can also camouflage hair loss, although some people prefer to leave their heads uncovered. If you go bare-headed outdoors, be sure to use sunscreen on your scalp. . Cut your hair short. It eases the inconvenience of shedding lots of hair, but it also can reduce the emotional impact of watching your hair fall out. . Don't perm or color your hair during chemotherapy. Those chemical treatments are already damaging to hair and can enhance hair loss. Once your chemo treatments are done and your hair has grown back, it's OK to resume dyeing or perming hair. With chemotherapy, hair loss is almost always temporary. But when it grows back, it may be a different color or texture. In older adults who still had hair color before chemotherapy, the new growth may be completely gray.  Often, new hair is very fine and soft.  Infection Prevention Please wash your hands for at least 30 seconds using warm soapy water. Handwashing is the #1 way to prevent the spread of germs. Stay away from sick people or people who are getting over a cold. If you develop respiratory systems such as green/yellow mucus production or productive cough or persistent cough let us know and we will see if you need an antibiotic. It is a good idea to keep a pair of gloves on when going into grocery stores/[employer] to decrease your risk of coming into  contact with germs on the carts, etc. Carry alcohol hand gel with you at all times and use it frequently if out in public. If your temperature reaches 100.5 or higher please call the clinic and let us know.  If it is after hours or on the weekend please go to the ER if your temperature is over 100.5.  Please have your own personal thermometer at home to use.    Sex and bodily fluids If you are going to have sex, a condom must be used to protect the person that isn't taking chemotherapy. Chemo can decrease your libido (sex drive). For a few days after chemotherapy, chemotherapy can be excreted through your bodily fluids.  When using the toilet please close the lid and flush the toilet twice.  Do this for a few day after you have had chemotherapy.   Effects of chemotherapy on your sex life Some changes are simple and won't last long. They won't affect your sex life permanently. Sometimes you may feel: . too tired . not strong enough to be very active . sick or sore  . not in the mood . anxious or low Your anxiety might not seem related to sex. For example, you may be worried about the cancer and how your treatment is going. Or you may be worried about money, or about how you family are coping with your illness. These things can cause stress, which can affect your interest in sex. It's important to talk to your partner about how you feel. Remember - the changes to your sex life don't usually last long. There's usually no medical reason to stop having sex during chemo. The drugs won't have any long term physical effects on your performance or enjoyment of sex. Cancer can't be passed on to your partner during sex  Contraception It's important to use reliable contraception during treatment. Avoid getting pregnant while you or your partner are having chemotherapy. This is because the drugs may harm the baby. Sometimes chemotherapy drugs can leave a man or woman infertile.  This means you would not be able  to have children in  the future. You might want to talk to someone about permanent infertility. It can be very difficult to learn that you may no longer be able to have children. Some people find counselling helpful. There might be ways to preserve your fertility, although this is easier for men than for women. You may want to speak to a fertility expert. You can talk about sperm banking or harvesting your eggs. You can also ask about other fertility options, such as donor eggs. If you have or have had breast cancer, your doctor might advise you not to take the contraceptive pill. This is because the hormones in it might affect the cancer.  It is not known for sure whether or not chemotherapy drugs can be passed on through semen or secretions from the vagina. Because of this some doctors advise people to use a barrier method if you have sex during treatment. This applies to vaginal, anal or oral sex. Generally, doctors advise a barrier method only for the time you are actually having the treatment and for about a week after your treatment. Advice like this can be worrying, but this does not mean that you have to avoid being intimate with your partner. You can still have close contact with your partner and continue to enjoy sex.  Animals If you have cats or birds we just ask that you not change the litter or change the cage.  Please have someone else do this for you while you are on chemotherapy.   Food Safety During and After Cancer Treatment Food safety is important for people both during and after cancer treatment. Cancer and cancer treatments, such as chemotherapy, radiation therapy, and stem cell/bone marrow transplantation, often weaken the immune system. This makes it harder for your body to protect itself from foodborne illness, also called food poisoning. Foodborne illness is caused by eating food that contains harmful bacteria, parasites, or viruses.  Foods to avoid Some foods have a higher  risk of becoming tainted with bacteria. These include: Marland Kitchen Unwashed fresh fruit and vegetables, especially leafy vegetables that can hide dirt and other contaminants . Raw sprouts, such as alfalfa sprouts . Raw or undercooked beef, especially ground beef, or other raw or undercooked meat and poultry . Fatty, fried, or spicy foods immediately before or after treatment.  These can sit heavy on your stomach and make you feel nauseous. . Raw or undercooked shellfish, such as oysters. . Sushi and sashimi, which often contain raw fish.  . Unpasteurized beverages, such as unpasteurized fruit juices, raw milk, raw yogurt, or cider . Undercooked eggs, such as soft boiled, over easy, and poached; raw, unpasteurized eggs; or foods made with raw egg, such as homemade raw cookie dough and homemade mayonnaise Simple steps for food safety Shop smart. . Do not buy food stored or displayed in an unclean area. . Do not buy bruised or damaged fruits or vegetables. . Do not buy cans that have cracks, dents, or bulges. . Pick up foods that can spoil at the end of your shopping trip and store them in a cooler on the way home. Prepare and clean up foods carefully. . Rinse all fresh fruits and vegetables under running water, and dry them with a clean towel or paper towel. . Clean the top of cans before opening them. . After preparing food, wash your hands for 20 seconds with hot water and soap. Pay special attention to areas between fingers and under nails. . Clean your utensils and dishes with hot  water and soap. Marland Kitchen Disinfect your kitchen and cutting boards using 1 teaspoon of liquid, unscented bleach mixed into 1 quart of water.   Dispose of old food. . Eat canned and packaged food before its expiration date (the "use by" or "best before" date). . Consume refrigerated leftovers within 3 to 4 days. After that time, throw out the food. Even if the food does not smell or look spoiled, it still may be unsafe. Some  bacteria, such as Listeria, can grow even on foods stored in the refrigerator if they are kept for too long. Take precautions when eating out. . At restaurants, avoid buffets and salad bars where food sits out for a long time and comes in contact with many people. Food can become contaminated when someone with a virus, often a norovirus, or another "bug" handles it. . Put any leftover food in a "to-go" container yourself, rather than having the server do it. And, refrigerate leftovers as soon as you get home. . Choose restaurants that are clean and that are willing to prepare your food as you order it cooked.   MEDICATIONS:                                                                                                                                                                Compazine/Prochlorperazine 10mg  tablet. Take 1 tablet every 6 hours as needed for nausea/vomiting. (This can make you sleepy)   EMLA cream. Apply a quarter size amount to port site 1 hour prior to chemo. Do not rub in. Cover with plastic wrap.   Over-the-Counter Meds:  Colace - 100 mg capsules - take 2 capsules daily.  If this doesn't help then you can increase to 2 capsules twice daily.  Call us if this does not help your bowels move.   Imodium 2mg  capsule. Take 2 capsules after the 1st loose stool and then 1 capsule every 2 hours until you go a total of 12 hours without having a loose stool. Call the Cancer Center if loose stools continue. If diarrhea occurs at bedtime, take 2 capsules at bedtime. Then take 2 capsules every 4 hours until morning. Call Cancer Center.    Diarrhea Sheet   If you are having loose stools/diarrhea, please purchase Imodium and begin taking as outlined:  At the first sign of poorly formed or loose stools you should begin taking Imodium (loperamide) 2 mg capsules.  Take two caplets (4mg ) followed by one caplet (2mg ) every 2 hours until you have had no diarrhea for 12 hours.  During the  night take two caplets (4mg ) at bedtime and continue every 4 hours during the night until the morning.  Stop taking Imodium only after there is no sign of diarrhea for 12 hours.  Always call the Cancer Center if you are having loose stools/diarrhea that you can't get under control.  Loose stools/diarrhea leads to dehydration (loss of water) in your body.  We have other options of trying to get the loose stools/diarrhea to stop but you must let us know!   Constipation Sheet  Colace - 100 mg capsules - take 2 capsules daily.  If this doesn't help then you can increase to 2 capsules twice daily.  Please call if the above does not work for you.   Do not go more than 2 days without a bowel movement.  It is very important that you do not become constipated.  It will make you feel sick to your stomach (nausea) and can cause abdominal pain and vomiting.   Nausea Sheet   Compazine/Prochlorperazine 10mg  tablet. Take 1 tablet every 6 hours as needed for nausea/vomiting. (This can make you sleepy)  If you are having persistent nausea (nausea that does not stop) please call the Cancer Center and let us know the amount of nausea that you are experiencing.  If you begin to vomit, you need to call the Cancer Center and if it is the weekend and you have vomited more than one time and can't get it to stop-go to the Emergency Room.  Persistent nausea/vomiting can lead to dehydration (loss of fluid in your body) and will make you feel terrible.   Ice chips, sips of clear liquids, foods that are @ room temperature, crackers, and toast tend to be better tolerated.   SYMPTOMS TO REPORT AS SOON AS POSSIBLE AFTER TREATMENT:   FEVER GREATER THAN 100.5 F  CHILLS WITH OR WITHOUT FEVER  NAUSEA AND VOMITING THAT IS NOT CONTROLLED WITH YOUR NAUSEA MEDICATION  UNUSUAL SHORTNESS OF BREATH  UNUSUAL BRUISING OR BLEEDING  TENDERNESS IN MOUTH AND THROAT WITH OR WITHOUT PRESENCE OF ULCERS  URINARY  PROBLEMS  BOWEL PROBLEMS  UNUSUAL RASH      Wear comfortable clothing and clothing appropriate for easy access to any Portacath or PICC line. Let us know if there is anything that we can do to make your therapy better!    What to do if you need assistance after hours or on the weekends: CALL [phone].  HOLD on the line, do not hang up.  You will hear multiple messages but at the end you will be connected with a nurse triage line.  They will contact the doctor if necessary.  Most of the time they will be able to assist you.  Do not call the hospital operator.      I have been informed and understand all of the instructions given to me and have received a copy. I have been instructed to call the clinic [phone] or my family physician as soon as possible for continued medical care, if indicated. I do not have any more questions at this time but understand that I may call the Cancer Center or the Patient Navigator at [phone] during office hours should I have questions or need assistance in obtaining follow-up care.

## 2018-01-31 NOTE — Progress Notes (Signed)
Chemotherapy teaching pulled together. 

## 2018-01-31 NOTE — Assessment & Plan Note (Signed)
1.  Poorly differentiated left breast cancer stage IIIb, (cT3cN1), ER positive, PR and HER-2 negative: - On 06/12/2017, left breast biopsy with sarcomatoid features, left axillary lymph node biopsy negative.  2D echo shows EF of 55-60%. - CT of the chest showed subcarinal adenopathy, PET CT scan negative for metastatic disease. -4 cycles of dose dense Adriamycin and cyclophosphamide from 07/18/2017 through 09/03/2017. - Weekly paclitaxel started on 09/17/2017.  Completed on 12/06/2017. - She underwent left modified radical mastectomy on 12/31/2017. -I have reviewed pathology report with the patient in detail.  This showed 2.3 cm poorly differentiated invasive ductal carcinoma with sarcomatoid changes, resection margins negative, high-grade DCIS, no skin involvement, 0 out of 8 lymph nodes positive, negative for lymphovascular or perineural invasion.  Pathological staging is YpT2YpN0. - Receptors were rechecked on mastectomy specimen.  Her to became positive by IHC.  This was initially negative by [place] on biopsy specimen.  ER was negative on mastectomy specimen.  It was 60% positive on biopsy specimen.  Ki-67 has decreased to 30% from 80% on biopsy.  - Her mastectomy site is well-healed.  I have called Dr. Melina Copa and pathology and had the HER-2 and ER staining reviewed. - This was reviewed by a second pathologist and was confirmed that HER-2 was 3+ positive by IHC and ER was negative.  Dr. Melina Copa told me that the tissue on which the ER receptors were checked was mostly sarcomatoid.  Hence it was staining negative for estrogen.  Ductal part of her cancer was still ER positive. -Based on this I have recommended 1 year of Herceptin.  I did not add on Pertuzumab because of lymph node negativity.  We talked about the side effects of Herceptin in detail including but not limited to diarrhea, rare chance of congestive heart failure. -I have reviewed echocardiogram dated 01/21/2018 which shows ejection fraction of 55 to  60%. - We will plan to start her on Herceptin next week.  She will continue it for a total of 1 year. -At next visit I also plan to start her on antiestrogen therapy in the form of AI.  2.  Hypertension: She is continuing metoprolol 50 mg twice daily and Norvasc 5 mg daily.  Blood pressure is well controlled.  3.  Neuropathy: -She had developed neuropathy from paclitaxel with numbness in the fingertips, which is constant.  No numbness in the feet. -No pharmacological intervention necessary at this time.

## 2018-01-31 NOTE — Progress Notes (Signed)
[place], [place] [address]   CLINIC:  Medical Oncology/Hematology  PCP:  Sandria Manly [facility] [address] [place] [address] [phone]   REASON FOR VISIT: Follow-up for left breast cancer  CURRENT THERAPY: Starting Herceptin  BRIEF ONCOLOGIC HISTORY:    Breast cancer of upper-outer quadrant of left female breast ([place])   06/22/2017 Initial Diagnosis    Breast cancer of upper-outer quadrant of left female breast ([place])    06/22/2017 Cancer Staging    Staging form: Breast, AJCC 8th Edition - Clinical stage from 06/22/2017: Stage IIIB (cT3, cN1, cM0, G3, ER+, PR-, HER2-) - Signed by Lisa Jack, MD on 06/22/2017    06/29/2017 Imaging    CT chest showing left axillary adenopathy, 1.2 cm anterior carinal adenopathy, left subpectoral lymph node subcentimeter  06/25/2017 2D echocardiogram with ejection fraction of 55-60%    07/16/2017 -  Chemotherapy    The patient had DOXOrubicin (ADRIAMYCIN) chemo injection 110 mg, 60 mg/m2 = 110 mg, Intravenous,  Once, 4 of 4 cycles Administration: 110 mg (07/18/2017), 110 mg (08/01/2017), 110 mg (08/20/2017), 110 mg (09/03/2017) palonosetron (ALOXI) injection 0.25 mg, 0.25 mg, Intravenous,  Once, 4 of 4 cycles Administration: 0.25 mg (07/18/2017), 0.25 mg (08/01/2017), 0.25 mg (08/20/2017), 0.25 mg (09/03/2017) pegfilgrastim-cbqv (UDENYCA) injection 6 mg, 6 mg, Subcutaneous, Once, 4 of 4 cycles Administration: 6 mg (07/19/2017), 6 mg (08/02/2017), 6 mg (08/22/2017), 6 mg (09/05/2017) cyclophosphamide (CYTOXAN) 1,100 mg in sodium chloride 0.9 % 250 mL chemo infusion, 600 mg/m2 = 1,100 mg, Intravenous,  Once, 4 of 4 cycles Administration: 1,100 mg (07/18/2017), 1,100 mg (08/01/2017), 1,100 mg (08/20/2017), 1,100 mg (09/03/2017) PACLitaxel (TAXOL) 120 mg in sodium chloride 0.9 % 250 mL chemo infusion (</= 66m/m2), 64 mg/m2 = 120 mg (80 % of original dose 80 mg/m2), Intravenous,  Once, 12 of 12 cycles Dose  modification: 64 mg/m2 (80 % of original dose 80 mg/m2, Cycle 5, Reason: Provider Judgment), 60 mg/m2 (75 % of original dose 80 mg/m2, Cycle 15, Reason: Other (see comments)), 40 mg/m2 (50 % of original dose 80 mg/m2, Cycle 16, Reason: Other (see comments)) Administration: 120 mg (09/17/2017), 144 mg (09/24/2017), 144 mg (10/03/2017), 144 mg (10/11/2017), 144 mg (10/18/2017), 144 mg (10/25/2017), 144 mg (11/01/2017), 144 mg (11/08/2017), 144 mg (11/15/2017), 144 mg (11/22/2017), 108 mg (11/29/2017), 72 mg (12/06/2017) fosaprepitant (EMEND) 150 mg, dexamethasone (DECADRON) 12 mg in sodium chloride 0.9 % 145 mL IVPB, , Intravenous,  Once, 4 of 4 cycles Administration:  (07/18/2017),  (08/01/2017),  (08/20/2017),  (09/03/2017)  for chemotherapy treatment.     01/31/2018 -  Chemotherapy    The patient had trastuzumab (HERCEPTIN) 567 mg in sodium chloride 0.9 % 250 mL chemo infusion, 8 mg/kg = 567 mg, Intravenous,  Once, 0 of 18 cycles  for chemotherapy treatment.       CANCER STAGING: Cancer Staging Breast cancer of upper-outer quadrant of left female breast ([facility] Staging form: Breast, AJCC 8th Edition - Clinical stage from 06/22/2017: Stage IIIB (cT3, cN1, cM0, G3, ER+, PR-, HER2-) - Signed by Lisa Jack MD on 06/22/2017    INTERVAL HISTORY:  [name] female returns for routine follow-up for left breast cancer. Patient is here today and complaining of flank pain and burning with urination. It has been a month since her surgery and her mastectomy site is healing and looking good. There is no redness or swelling at incision site. She states that it has been itching a  lot. She states that her numbness and tingling in her legs and feet are stable at this time. She denies any new pains. Denies any nausea, vomiting, or dairrhea. Denies any skin rashes or mouth sores. Denies any SOB or cough. Denies any bleeding.     REVIEW OF SYSTEMS:  Review of Systems  Genitourinary: Positive for dysuria and  frequency.   Skin: Positive for itching (mastectomy site).  Neurological: Positive for numbness.  All other systems reviewed and are negative.    PAST MEDICAL/SURGICAL HISTORY:  Past Medical History:  Diagnosis Date  . Cancer [facility])    left breast cancer  . Chest pain   . Hypertension    Past Surgical History:  Procedure Laterality Date  . MASTECTOMY MODIFIED RADICAL Left 12/31/2017   Procedure: LEFT MODIFIED RADICAL MASTECTOMY;  Surgeon: Aviva Signs, MD;  Location: [facility];  Service: General;  Laterality: Left;  . PORTACATH PLACEMENT Right 06/27/2017   Procedure: INSERTION PORT-A-CATH;  Surgeon: Aviva Signs, MD;  Location: [facility];  Service: General;  Laterality: Right;     SOCIAL HISTORY:  Social History   Socioeconomic History  . Marital status: Divorced    Spouse name: Not on file  . Number of children: Not on file  . Years of education: Not on file  . Highest education level: Not on file  Occupational History  . Not on file  Social Needs  . Financial resource strain: Somewhat hard  . Food insecurity:    Worry: Never true    Inability: Never true  . Transportation needs:    Medical: No    Non-medical: No  Tobacco Use  . Smoking status: Never Smoker  . Smokeless tobacco: Never Used  Substance and Sexual Activity  . Alcohol use: No  . Drug use: No  . Sexual activity: Not Currently  Lifestyle  . Physical activity:    Days per week: 3 days    Minutes per session: 10 min  . Stress: Very much  Relationships  . Social connections:    Talks on phone: More than three times a week    Gets together: More than three times a week    Attends religious service: More than 4 times per year    Active member of club or organization: Yes    Attends meetings of clubs or organizations: Never    Relationship status: Divorced  . Intimate partner violence:    Fear of current or ex partner: Patient refused    Emotionally abused: Patient refused    Physically abused: Patient  refused    Forced sexual activity: Patient refused  Other Topics Concern  . Not on file  Social History Narrative  . Not on file    FAMILY HISTORY:  Family History  Problem Relation Age of Onset  . Breast cancer Mother   . Thyroid disease Mother   . Heart disease Mother   . Heart attack Father   . Heart attack Sister   . Hypertension Sister   . Heart attack Brother   . Cancer Sister   . Stroke Brother   . Alzheimer's disease Maternal Aunt     CURRENT MEDICATIONS:  Outpatient Encounter Medications as of 01/31/2018  Medication Sig  . amLODipine (NORVASC) 5 MG tablet Take 1 tablet (5 mg total) by mouth daily.  Marland Kitchen HYDROcodone-acetaminophen (NORCO) 5-325 MG tablet Take 1 tablet by mouth every 4 (four) hours as needed for moderate pain.  . metoprolol tartrate (LOPRESSOR) 50 MG tablet Take 50 mg  by mouth 2 (two) times daily.  . ondansetron (ZOFRAN) 8 MG tablet Take 1 tablet (8 mg total) by mouth 2 (two) times daily as needed. Start on the third day after chemotherapy.  . prochlorperazine (COMPAZINE) 10 MG tablet Take 1 tablet (10 mg total) by mouth every 6 (six) hours as needed (Nausea or vomiting). (Patient not taking: Reported on 01/31/2018)   No facility-administered encounter medications on file as of 01/31/2018.     ALLERGIES:  No Known Allergies   PHYSICAL EXAM:  ECOG Performance status: 1  Vitals:   01/31/18 1044  BP: 113/83  Pulse: 70  Resp: 16  Temp: 98.8 F (37.1 C)  SpO2: 97%   Filed Weights   01/31/18 1044  Weight: 154 lb (69.9 kg)    Physical Exam  Constitutional: She is oriented to person, place, and time. She appears well-developed and well-nourished.  Cardiovascular: Normal rate, regular rhythm and normal heart sounds.  Pulmonary/Chest: Effort normal and breath sounds normal.  Musculoskeletal: Normal range of motion.  Neurological: She is alert and oriented to person, place, and time.  Skin: Skin is warm and dry.  Psychiatric: She has a normal  mood and affect. Her behavior is normal. Judgment and thought content normal.  Breast: Left: no redness or swelling at the mastectomy site. No palpable masses   LABORATORY DATA:  I have reviewed the labs as listed.  CBC    Component Value Date/Time   WBC 5.1 01/31/2018 1002   RBC 3.63 (L) 01/31/2018 1002   HGB 11.2 (L) 01/31/2018 1002   HCT 33.8 (L) 01/31/2018 1002   PLT 218 01/31/2018 1002   MCV 93.1 01/31/2018 1002   MCH 30.9 01/31/2018 1002   MCHC 33.1 01/31/2018 1002   RDW 11.7 01/31/2018 1002   LYMPHSABS 1.3 01/31/2018 1002   MONOABS 0.4 01/31/2018 1002   EOSABS 0.2 01/31/2018 1002   BASOSABS 0.0 01/31/2018 1002   CMP Latest Ref Rng & Units 01/31/2018 01/11/2018 01/01/2018  Glucose 70 - 99 mg/dL 131(H) 125(H) 143(H)  BUN 8 - 23 mg/dL _0 Creatinine 0.44 - 1.00 mg/dL 0.82 0.83 0.91  Sodium 135 - 145 mmol/L 139 140 140  Potassium 3.5 - 5.1 mmol/L 3.9 3.7 3.9  Chloride 98 - 111 mmol/L 106 107 109  CO2 22 - 32 mmol/L _1 Calcium 8.9 - 10.3 mg/dL 9.3 9.2 8.3(L)  Total Protein 6.5 - 8.1 g/dL 7.4 7.2 -  Total Bilirubin 0.3 - 1.2 mg/dL 0.5 0.4 -  Alkaline Phos 38 - 126 U/L 51 49 -  AST 15 - 41 U/L 25 25 -  ALT 0 - 44 U/L 19 17 -       DIAGNOSTIC IMAGING:  I have reviewed echocardiogram dated 01/21/2018 and discussed with the patient.     ASSESSMENT & PLAN:   Breast cancer of upper-outer quadrant of left female breast ([place]) 1.  Poorly differentiated left breast cancer stage IIIb, (cT3cN1), ER positive, PR and HER-2 negative: - On 06/12/2017, left breast biopsy with sarcomatoid features, left axillary lymph node biopsy negative.  2D echo shows EF of 55-60%. - CT of the chest showed subcarinal adenopathy, PET CT scan negative for metastatic disease. -4 cycles of dose dense Adriamycin and cyclophosphamide from 07/18/2017 through 09/03/2017. - Weekly paclitaxel started on 09/17/2017.  Completed on 12/06/2017. - She underwent left modified radical mastectomy on  12/31/2017. -I have reviewed pathology report with the patient in detail.  This showed 2.3 cm poorly differentiated  invasive ductal carcinoma with sarcomatoid changes, resection margins negative, high-grade DCIS, no skin involvement, 0 out of 8 lymph nodes positive, negative for lymphovascular or perineural invasion.  Pathological staging is YpT2YpN0. - Receptors were rechecked on mastectomy specimen.  Her to became positive by IHC.  This was initially negative by [place] on biopsy specimen.  ER was negative on mastectomy specimen.  It was 60% positive on biopsy specimen.  Ki-67 has decreased to 30% from 80% on biopsy.  - Her mastectomy site is well-healed.  I have called Dr. Melina Copa and pathology and had the HER-2 and ER staining reviewed. - This was reviewed by a second pathologist and was confirmed that HER-2 was 3+ positive by IHC and ER was negative.  Dr. Melina Copa told me that the tissue on which the ER receptors were checked was mostly sarcomatoid.  Hence it was staining negative for estrogen.  Ductal part of her cancer was still ER positive. -Based on this I have recommended 1 year of Herceptin.  I did not add on Pertuzumab because of lymph node negativity.  We talked about the side effects of Herceptin in detail including but not limited to diarrhea, rare chance of congestive heart failure. -I have reviewed echocardiogram dated 01/21/2018 which shows ejection fraction of 55 to 60%. - We will plan to start her on Herceptin next week.  She will continue it for a total of 1 year. -At next visit I also plan to start her on antiestrogen therapy in the form of AI.  2.  Hypertension: She is continuing metoprolol 50 mg twice daily and Norvasc 5 mg daily.  Blood pressure is well controlled.  3.  Neuropathy: -She had developed neuropathy from paclitaxel with numbness in the fingertips, which is constant.  No numbness in the feet. -No pharmacological intervention necessary at this time.     Total time spent  is 40 minutes with more than 50% of the time spent face-to-face discussing treatment change, side effects, and coordination of care.  Orders placed this encounter:  Orders Placed This Encounter  Procedures  . Urine culture  . Urinalysis, Routine w reflex microscopic      Lisa Jack, MD [place] [phone]

## 2018-02-03 LAB — URINE CULTURE: Culture: >=100000 — AB

## 2018-02-05 ENCOUNTER — Ambulatory Visit (HOSPITAL_COMMUNITY): Payer: Medicaid Other | Admitting: Hematology

## 2018-02-06 ENCOUNTER — Ambulatory Visit (HOSPITAL_COMMUNITY): Payer: Medicaid Other

## 2018-02-06 ENCOUNTER — Inpatient Hospital Stay (HOSPITAL_COMMUNITY): Payer: Medicaid Other | Attending: Hematology

## 2018-02-06 DIAGNOSIS — I10 Essential (primary) hypertension: Secondary | ICD-10-CM | POA: Insufficient documentation

## 2018-02-06 DIAGNOSIS — Z79899 Other long term (current) drug therapy: Secondary | ICD-10-CM | POA: Insufficient documentation

## 2018-02-06 DIAGNOSIS — C50412 Malignant neoplasm of upper-outer quadrant of left female breast: Secondary | ICD-10-CM

## 2018-02-06 DIAGNOSIS — G620 Drug-induced polyneuropathy: Secondary | ICD-10-CM | POA: Insufficient documentation

## 2018-02-06 DIAGNOSIS — R5383 Other fatigue: Secondary | ICD-10-CM | POA: Insufficient documentation

## 2018-02-06 DIAGNOSIS — Z79811 Long term (current) use of aromatase inhibitors: Secondary | ICD-10-CM | POA: Insufficient documentation

## 2018-02-06 DIAGNOSIS — Z9012 Acquired absence of left breast and nipple: Secondary | ICD-10-CM | POA: Insufficient documentation

## 2018-02-06 DIAGNOSIS — Z170 Estrogen receptor positive status [ER+]: Secondary | ICD-10-CM | POA: Insufficient documentation

## 2018-02-06 DIAGNOSIS — Z5112 Encounter for antineoplastic immunotherapy: Secondary | ICD-10-CM | POA: Insufficient documentation

## 2018-02-07 ENCOUNTER — Inpatient Hospital Stay (HOSPITAL_COMMUNITY): Payer: Medicaid Other

## 2018-02-07 ENCOUNTER — Encounter (HOSPITAL_COMMUNITY): Payer: Self-pay

## 2018-02-07 ENCOUNTER — Other Ambulatory Visit: Payer: Self-pay

## 2018-02-07 VITALS — BP 135/51 | HR 54 | Temp 98.4°F | Resp 18 | Wt 154.4 lb

## 2018-02-07 DIAGNOSIS — R5383 Other fatigue: Secondary | ICD-10-CM | POA: Diagnosis not present

## 2018-02-07 DIAGNOSIS — Z170 Estrogen receptor positive status [ER+]: Secondary | ICD-10-CM | POA: Diagnosis not present

## 2018-02-07 DIAGNOSIS — Z9012 Acquired absence of left breast and nipple: Secondary | ICD-10-CM | POA: Diagnosis not present

## 2018-02-07 DIAGNOSIS — C50412 Malignant neoplasm of upper-outer quadrant of left female breast: Secondary | ICD-10-CM | POA: Diagnosis not present

## 2018-02-07 DIAGNOSIS — I10 Essential (primary) hypertension: Secondary | ICD-10-CM | POA: Diagnosis not present

## 2018-02-07 DIAGNOSIS — G620 Drug-induced polyneuropathy: Secondary | ICD-10-CM | POA: Diagnosis not present

## 2018-02-07 DIAGNOSIS — Z79811 Long term (current) use of aromatase inhibitors: Secondary | ICD-10-CM | POA: Diagnosis not present

## 2018-02-07 DIAGNOSIS — Z79899 Other long term (current) drug therapy: Secondary | ICD-10-CM | POA: Diagnosis not present

## 2018-02-07 DIAGNOSIS — Z5112 Encounter for antineoplastic immunotherapy: Secondary | ICD-10-CM | POA: Diagnosis present

## 2018-02-07 MED ORDER — DIPHENHYDRAMINE HCL 25 MG PO CAPS
50.0000 mg | ORAL_CAPSULE | Freq: Once | ORAL | Status: AC
  Administered 2018-02-07: 50 mg via ORAL

## 2018-02-07 MED ORDER — ACETAMINOPHEN 325 MG PO TABS
ORAL_TABLET | ORAL | Status: AC
  Filled 2018-02-07: qty 2

## 2018-02-07 MED ORDER — ACETAMINOPHEN 325 MG PO TABS
650.0000 mg | ORAL_TABLET | Freq: Once | ORAL | Status: AC
  Administered 2018-02-07: 650 mg via ORAL

## 2018-02-07 MED ORDER — HEPARIN SOD (PORK) LOCK FLUSH 100 UNIT/ML IV SOLN
500.0000 [IU] | Freq: Once | INTRAVENOUS | Status: AC | PRN
  Administered 2018-02-07: 500 [IU]

## 2018-02-07 MED ORDER — TRASTUZUMAB CHEMO 150 MG IV SOLR
600.0000 mg | Freq: Once | INTRAVENOUS | Status: AC
  Administered 2018-02-07: 600 mg via INTRAVENOUS
  Filled 2018-02-07: qty 28.57

## 2018-02-07 MED ORDER — SODIUM CHLORIDE 0.9 % IV SOLN
Freq: Once | INTRAVENOUS | Status: AC
  Administered 2018-02-07: 11:00:00 via INTRAVENOUS

## 2018-02-07 MED ORDER — DIPHENHYDRAMINE HCL 25 MG PO CAPS
ORAL_CAPSULE | ORAL | Status: AC
  Filled 2018-02-07: qty 2

## 2018-02-07 NOTE — Progress Notes (Signed)
Tolerated infusion w/o adverse reaction.  Alert, in no distress.  VSS.  Discharged ambulatory in c/o family.  

## 2018-02-08 ENCOUNTER — Encounter: Payer: Self-pay | Admitting: Cardiovascular Disease

## 2018-02-08 ENCOUNTER — Telehealth (HOSPITAL_COMMUNITY): Payer: Self-pay

## 2018-02-08 ENCOUNTER — Ambulatory Visit (INDEPENDENT_AMBULATORY_CARE_PROVIDER_SITE_OTHER): Payer: Medicaid Other | Admitting: Cardiovascular Disease

## 2018-02-08 VITALS — BP 160/90 | HR 78 | Ht 64.0 in | Wt 154.0 lb

## 2018-02-08 DIAGNOSIS — Z9289 Personal history of other medical treatment: Secondary | ICD-10-CM

## 2018-02-08 DIAGNOSIS — I10 Essential (primary) hypertension: Secondary | ICD-10-CM | POA: Diagnosis not present

## 2018-02-08 DIAGNOSIS — I4891 Unspecified atrial fibrillation: Secondary | ICD-10-CM | POA: Diagnosis not present

## 2018-02-08 NOTE — Patient Instructions (Signed)
Medication Instructions:  Your physician recommends that you continue on your current medications as directed. Please refer to the Current Medication list given to you today.  If you need a refill on your cardiac medications before your next appointment, please call your pharmacy.   Lab work: NONE  If you have labs (blood work) drawn today and your tests are completely normal, you will receive your results only by: Marland Kitchen MyChart Message (if you have MyChart) OR . A paper copy in the mail If you have any lab test that is abnormal or we need to change your treatment, we will call you to review the results.  Testing/Procedures: NONE  Follow-Up: At [facility], you and your health needs are our priority.  As part of our continuing mission to provide you with exceptional heart care, we have created designated Provider Care Teams.  These Care Teams include your primary Cardiologist (physician) and Advanced Practice Providers (APPs -  Physician Assistants and Nurse Practitioners) who all work together to provide you with the care you need, when you need it. You will need a follow up appointment in 6 months.  Please call our office 2 months in advance to schedule this appointment.  You may see No primary care provider on file. or one of the following Advanced Practice Providers on your designated Care Team:   [place], PA-C [facility]) . Ermalinda Barrios, PA-C ([place])  Any Other Special Instructions Will Be Listed Below (If Applicable). NONE

## 2018-02-08 NOTE — Telephone Encounter (Signed)
24 HR Chemo F/U call. Pt reports she is doing well and denies any issues at all. Pt encouraged to call for any problems or questions with understanding verbalized

## 2018-02-08 NOTE — Progress Notes (Signed)
CARDIOLOGY CONSULT NOTE  Patient ID: Lisa Thornton MRN: [id number] DOB/AGE: [birth year] 62 y.o.  Admit date: (Not on file) Primary Physician: [facility], [place] Referring Physician: [facility], [facility]  Reason for Consultation: Atrial fibrillation  HPI: Lisa Thornton is a 62 y.o. female who is being seen today for the evaluation of atrial fibrillation at the request of [facility], [facility]   Past medical history includes hypertension and poorly differentiated left breast breast cancer for which she underwent left modified radical mastectomy on 12/31/2017.  She has been treated with neoadjuvant chemotherapy as well.  Most recent echocardiogram performed on 01/21/2018 shows normal left ventricular systolic and diastolic function and normal regional wall motion, LVEF 55 to 60%.  There was mild mitral annular calcification with mildly thickened leaflets and mild tricuspid regurgitation.  I personally reviewed the ECG performed on 01/01/2018 which demonstrated sinus rhythm with anteroseptal Q waves.  At the time of modified radical mastectomy, she was found to have a transient episode of rapid atrial fibrillation.  It occurred in the postoperative setting.  She spontaneously converted back to sinus rhythm and was started on metoprolol.  I did not find any documentation of this on either ECGs or telemetry strips.  The patient denies any symptoms of chest pain, palpitations, shortness of breath, lightheadedness, dizziness, leg swelling, orthopnea, PND, and syncope.    No Known Allergies  Current Outpatient Medications  Medication Sig Dispense Refill  . amLODipine (NORVASC) 5 MG tablet Take 1 tablet (5 mg total) by mouth daily. 30 tablet 6  . ciprofloxacin (CIPRO) 500 MG tablet Take 1 tablet (500 mg total) by mouth 2 (two) times daily. 14 tablet 0  . HYDROcodone-acetaminophen (NORCO) 5-325 MG tablet Take 1 tablet by mouth every 4 (four) hours as needed  for moderate pain. 40 tablet 0  . metoprolol tartrate (LOPRESSOR) 50 MG tablet Take 50 mg by mouth 2 (two) times daily.    . ondansetron (ZOFRAN) 8 MG tablet Take 1 tablet (8 mg total) by mouth 2 (two) times daily as needed. Start on the third day after chemotherapy. 30 tablet 1  . prochlorperazine (COMPAZINE) 10 MG tablet Take 1 tablet (10 mg total) by mouth every 6 (six) hours as needed (Nausea or vomiting). 30 tablet 1  . Trastuzumab (HERCEPTIN IV) Inject into the vein.     No current facility-administered medications for this visit.     Past Medical History:  Diagnosis Date  . Cancer [facility])    left breast cancer  . Chest pain   . Hypertension     Past Surgical History:  Procedure Laterality Date  . MASTECTOMY MODIFIED RADICAL Left 12/31/2017   Procedure: LEFT MODIFIED RADICAL MASTECTOMY;  Surgeon: Aviva Signs, MD;  Location: [facility];  Service: General;  Laterality: Left;  . PORTACATH PLACEMENT Right 06/27/2017   Procedure: INSERTION PORT-A-CATH;  Surgeon: Aviva Signs, MD;  Location: [facility];  Service: General;  Laterality: Right;    Social History   Socioeconomic History  . Marital status: Divorced    Spouse name: Not on file  . Number of children: Not on file  . Years of education: Not on file  . Highest education level: Not on file  Occupational History  . Not on file  Social Needs  . Financial resource strain: Somewhat hard  . Food insecurity:    Worry: Never true    Inability: Never true  . Transportation needs:    Medical: No  Non-medical: No  Tobacco Use  . Smoking status: Never Smoker  . Smokeless tobacco: Never Used  Substance and Sexual Activity  . Alcohol use: No  . Drug use: No  . Sexual activity: Not Currently  Lifestyle  . Physical activity:    Days per week: 3 days    Minutes per session: 10 min  . Stress: Very much  Relationships  . Social connections:    Talks on phone: More than three times a week    Gets together: More than three times  a week    Attends religious service: More than 4 times per year    Active member of club or organization: Yes    Attends meetings of clubs or organizations: Never    Relationship status: Divorced  . Intimate partner violence:    Fear of current or ex partner: Patient refused    Emotionally abused: Patient refused    Physically abused: Patient refused    Forced sexual activity: Patient refused  Other Topics Concern  . Not on file  Social History Narrative  . Not on file     No family history of premature CAD in 1st degree relatives.  Current Meds  Medication Sig  . amLODipine (NORVASC) 5 MG tablet Take 1 tablet (5 mg total) by mouth daily.  . ciprofloxacin (CIPRO) 500 MG tablet Take 1 tablet (500 mg total) by mouth 2 (two) times daily.  Marland Kitchen HYDROcodone-acetaminophen (NORCO) 5-325 MG tablet Take 1 tablet by mouth every 4 (four) hours as needed for moderate pain.  . metoprolol tartrate (LOPRESSOR) 50 MG tablet Take 50 mg by mouth 2 (two) times daily.  . ondansetron (ZOFRAN) 8 MG tablet Take 1 tablet (8 mg total) by mouth 2 (two) times daily as needed. Start on the third day after chemotherapy.  . prochlorperazine (COMPAZINE) 10 MG tablet Take 1 tablet (10 mg total) by mouth every 6 (six) hours as needed (Nausea or vomiting).  . Trastuzumab (HERCEPTIN IV) Inject into the vein.      Review of systems complete and found to be negative unless listed above in HPI    Physical exam Height 5\' 4"  (1.626 m), weight 154 lb (69.9 kg). General: NAD Neck: No JVD, no thyromegaly or thyroid nodule.  Lungs: Clear to auscultation bilaterally with normal respiratory effort. CV: Nondisplaced PMI. Regular rate and rhythm, normal S1/S2, no S3/S4, no murmur.  No peripheral edema.      Abdomen: Soft, nontender, no distention.  Skin: Intact without lesions or rashes.  Neurologic: Alert and oriented x 3.  Psych: Normal affect. Extremities: No clubbing or cyanosis.  HEENT: Normal.   ECG: Most recent  ECG reviewed.   Labs: Lab Results  Component Value Date/Time   K 3.9 01/31/2018 10:02 AM   BUN 10 01/31/2018 10:02 AM   CREATININE 0.82 01/31/2018 10:02 AM   ALT 19 01/31/2018 10:02 AM   TSH 1.412 12/31/2017 05:43 PM   HGB 11.2 (L) 01/31/2018 10:02 AM     Lipids: No results found for: LDLCALC, LDLDIRECT, CHOL, TRIG, HDL      ASSESSMENT AND PLAN:  1.  Atrial fibrillation: She is entirely asymptomatic with respect to palpitations, chest pain, and shortness of breath.  This appears to have been an isolated instance.  She will turn 62 later this month.  Currently, CHADSVASC score is 2 (hypertension and gender) and most recent updated guidelines indicate that for a woman, systemic anticoagulation is indicated when the score is 3.  Left atrial size  is normal.  Currently on Lopressor 50 mg twice daily.  For the time being, I do not feel event monitoring would be high yield.  I told her should she develop palpitations, I would then consider cardiac monitoring at that time.  2.  Hypertension: Blood pressure is elevated.  She has not taken her medications this morning.     Disposition: Follow up in 6 months  Signed: Kate Sable, M.D., F.A.C.C.  02/08/2018, 9:57 AM

## 2018-02-27 ENCOUNTER — Encounter (HOSPITAL_COMMUNITY): Payer: Self-pay

## 2018-02-27 ENCOUNTER — Ambulatory Visit (HOSPITAL_COMMUNITY): Payer: Medicaid Other | Admitting: Hematology

## 2018-02-27 ENCOUNTER — Inpatient Hospital Stay (HOSPITAL_BASED_OUTPATIENT_CLINIC_OR_DEPARTMENT_OTHER): Payer: Medicaid Other | Admitting: Hematology

## 2018-02-27 ENCOUNTER — Inpatient Hospital Stay (HOSPITAL_COMMUNITY): Payer: Medicaid Other

## 2018-02-27 ENCOUNTER — Encounter (HOSPITAL_COMMUNITY): Payer: Self-pay | Admitting: Hematology

## 2018-02-27 ENCOUNTER — Other Ambulatory Visit (HOSPITAL_COMMUNITY): Payer: Self-pay | Admitting: Nurse Practitioner

## 2018-02-27 ENCOUNTER — Other Ambulatory Visit (HOSPITAL_COMMUNITY): Payer: Medicaid Other

## 2018-02-27 ENCOUNTER — Ambulatory Visit (HOSPITAL_COMMUNITY): Payer: Medicaid Other

## 2018-02-27 VITALS — BP 112/49 | HR 76 | Temp 98.0°F | Resp 18 | Wt 154.8 lb

## 2018-02-27 DIAGNOSIS — Z170 Estrogen receptor positive status [ER+]: Principal | ICD-10-CM

## 2018-02-27 DIAGNOSIS — Z9012 Acquired absence of left breast and nipple: Secondary | ICD-10-CM

## 2018-02-27 DIAGNOSIS — Z79811 Long term (current) use of aromatase inhibitors: Secondary | ICD-10-CM | POA: Diagnosis not present

## 2018-02-27 DIAGNOSIS — G620 Drug-induced polyneuropathy: Secondary | ICD-10-CM | POA: Diagnosis not present

## 2018-02-27 DIAGNOSIS — C50412 Malignant neoplasm of upper-outer quadrant of left female breast: Secondary | ICD-10-CM | POA: Diagnosis not present

## 2018-02-27 DIAGNOSIS — Z9221 Personal history of antineoplastic chemotherapy: Secondary | ICD-10-CM

## 2018-02-27 DIAGNOSIS — R5383 Other fatigue: Secondary | ICD-10-CM

## 2018-02-27 DIAGNOSIS — Z79899 Other long term (current) drug therapy: Secondary | ICD-10-CM

## 2018-02-27 DIAGNOSIS — Z5112 Encounter for antineoplastic immunotherapy: Secondary | ICD-10-CM | POA: Diagnosis not present

## 2018-02-27 DIAGNOSIS — I10 Essential (primary) hypertension: Secondary | ICD-10-CM

## 2018-02-27 LAB — CBC WITH DIFFERENTIAL/PLATELET
Abs Immature Granulocytes: 0.03 10*3/uL (ref 0.00–0.07)
BASOS ABS: 0 10*3/uL (ref 0.0–0.1)
BASOS PCT: 0 %
EOS ABS: 0.3 10*3/uL (ref 0.0–0.5)
EOS PCT: 4 %
HCT: 37.1 % (ref 36.0–46.0)
Hemoglobin: 12.6 g/dL (ref 12.0–15.0)
Immature Granulocytes: 0 %
LYMPHS ABS: 1.8 10*3/uL (ref 0.7–4.0)
Lymphocytes Relative: 25 %
MCH: 31.5 pg (ref 26.0–34.0)
MCHC: 34 g/dL (ref 30.0–36.0)
MCV: 92.8 fL (ref 80.0–100.0)
MONOS PCT: 8 %
Monocytes Absolute: 0.6 10*3/uL (ref 0.1–1.0)
NRBC: 0 % (ref 0.0–0.2)
Neutro Abs: 4.4 10*3/uL (ref 1.7–7.7)
Neutrophils Relative %: 63 %
Platelets: 249 10*3/uL (ref 150–400)
RBC: 4 MIL/uL (ref 3.87–5.11)
RDW: 11.5 % (ref 11.5–15.5)
WBC: 7 10*3/uL (ref 4.0–10.5)

## 2018-02-27 LAB — COMPREHENSIVE METABOLIC PANEL
ALT: 22 U/L (ref 0–44)
ANION GAP: 7 (ref 5–15)
AST: 25 U/L (ref 15–41)
Albumin: 3.9 g/dL (ref 3.5–5.0)
Alkaline Phosphatase: 54 U/L (ref 38–126)
BUN: 13 mg/dL (ref 8–23)
CO2: 24 mmol/L (ref 22–32)
Calcium: 9.4 mg/dL (ref 8.9–10.3)
Chloride: 108 mmol/L (ref 98–111)
Creatinine, Ser: 0.91 mg/dL (ref 0.44–1.00)
Glucose, Bld: 157 mg/dL — ABNORMAL HIGH (ref 70–99)
POTASSIUM: 4.3 mmol/L (ref 3.5–5.1)
Sodium: 139 mmol/L (ref 135–145)
TOTAL PROTEIN: 7.5 g/dL (ref 6.5–8.1)
Total Bilirubin: 0.3 mg/dL (ref 0.3–1.2)

## 2018-02-27 MED ORDER — DIPHENHYDRAMINE HCL 25 MG PO CAPS
50.0000 mg | ORAL_CAPSULE | Freq: Once | ORAL | Status: AC
  Administered 2018-02-27: 50 mg via ORAL

## 2018-02-27 MED ORDER — SODIUM CHLORIDE 0.9% FLUSH
10.0000 mL | INTRAVENOUS | Status: DC | PRN
  Administered 2018-02-27: 10 mL
  Filled 2018-02-27: qty 10

## 2018-02-27 MED ORDER — DIPHENHYDRAMINE HCL 25 MG PO CAPS
ORAL_CAPSULE | ORAL | Status: AC
  Filled 2018-02-27: qty 2

## 2018-02-27 MED ORDER — SODIUM CHLORIDE 0.9 % IV SOLN
Freq: Once | INTRAVENOUS | Status: AC
  Administered 2018-02-27: 15:00:00 via INTRAVENOUS

## 2018-02-27 MED ORDER — ACETAMINOPHEN 325 MG PO TABS
ORAL_TABLET | ORAL | Status: AC
  Filled 2018-02-27: qty 2

## 2018-02-27 MED ORDER — ANASTROZOLE 1 MG PO TABS: 1.0000 mg | ORAL_TABLET | Freq: Every day | ORAL | 6 refills | Status: DC

## 2018-02-27 MED ORDER — HEPARIN SOD (PORK) LOCK FLUSH 100 UNIT/ML IV SOLN
500.0000 [IU] | Freq: Once | INTRAVENOUS | Status: AC | PRN
  Administered 2018-02-27: 500 [IU]

## 2018-02-27 MED ORDER — TRASTUZUMAB CHEMO 150 MG IV SOLR
450.0000 mg | Freq: Once | INTRAVENOUS | Status: AC
  Administered 2018-02-27: 450 mg via INTRAVENOUS
  Filled 2018-02-27: qty 21.43

## 2018-02-27 MED ORDER — ACETAMINOPHEN 325 MG PO TABS
650.0000 mg | ORAL_TABLET | Freq: Once | ORAL | Status: AC
  Administered 2018-02-27: 650 mg via ORAL

## 2018-02-27 NOTE — Assessment & Plan Note (Signed)
1.  Poorly differentiated left breast cancer stage IIIb, (cT3cN1), ER positive, PR and HER-2 negative: - On 06/12/2017, left breast biopsy with sarcomatoid features, left axillary lymph node biopsy negative.  2D echo shows EF of 55-60%. - CT of the chest showed subcarinal adenopathy, PET CT scan negative for metastatic disease. -4 cycles of dose dense Adriamycin and cyclophosphamide from 07/18/2017 through 09/03/2017. - Weekly paclitaxel started on 09/17/2017.  Completed on 12/06/2017. - She underwent left modified radical mastectomy on 12/31/2017. -I have reviewed pathology report with the patient in detail.  This showed 2.3 cm poorly differentiated invasive ductal carcinoma with sarcomatoid changes, resection margins negative, high-grade DCIS, no skin involvement, 0 out of 8 lymph nodes positive, negative for lymphovascular or perineural invasion.  Pathological staging is YpT2YpN0. - Receptors were rechecked on mastectomy specimen.  Her to became positive by IHC.  This was initially negative by [place] on biopsy specimen.  ER was negative on mastectomy specimen.  It was 60% positive on biopsy specimen.  Ki-67 has decreased to 30% from 80% on biopsy.  - Her mastectomy site is well-healed.  I have called Dr. Melina Copa and pathology and had the HER-2 and ER staining reviewed. - This was reviewed by a second pathologist and was confirmed that HER-2 was 3+ positive by IHC and ER was negative.  Dr. Melina Copa told me that the tissue on which the ER receptors were checked was mostly sarcomatoid.  Hence it was staining negative for estrogen.  Ductal part of her cancer was still ER positive. -Based on this I have recommended 1 year of Herceptin.  I did not add on Pertuzumab because of lymph node negativity.  We talked about the side effects of Herceptin in detail including but not limited to diarrhea, rare chance of congestive heart failure. -I have reviewed echocardiogram dated 01/21/2018 which shows ejection fraction of 55 to  60%. - She was started on Herceptin on 02/07/2018.  She tolerated it very well.  She denied any diarrhea. - We reviewed her blood work which was within normal limits.  She may proceed with her second dose of Herceptin today. - We talked about starting her on aromatase inhibitor anastrozole 1 mg daily for at least 5 years.  We talked about the various side effects including but not limited to hot flashes, muscular skeletal symptoms, decreased bone mineral density.  She was told to start taking calcium and vitamin D twice daily.  We have sent a prescription for her anastrozole. -We will also obtain a baseline bone density test prior to next visit in 9 weeks.  2.  Hypertension: She is continuing metoprolol 50 mg twice daily and Norvasc 5 mg daily.  Blood pressure is well controlled.  3.  Neuropathy: -Neuropathy from paclitaxel with numbness in the fingertips is constant.

## 2018-02-27 NOTE — Progress Notes (Signed)
[place], [place] [address]   CLINIC:  Medical Oncology/Hematology  PCP:  Sandria Manly [facility] [address] [place] [address] [phone]   REASON FOR VISIT: Follow-up for left breast cancer  CURRENT THERAPY: Herceptin and arimidex  BRIEF ONCOLOGIC HISTORY:    Breast cancer of upper-outer quadrant of left female breast ([place])   06/22/2017 Initial Diagnosis    Breast cancer of upper-outer quadrant of left female breast ([place])    06/22/2017 Cancer Staging    Staging form: Breast, AJCC 8th Edition - Clinical stage from 06/22/2017: Stage IIIB (cT3, cN1, cM0, G3, ER+, PR-, HER2-) - Signed by Derek Jack, MD on 06/22/2017    06/29/2017 Imaging    CT chest showing left axillary adenopathy, 1.2 cm anterior carinal adenopathy, left subpectoral lymph node subcentimeter  06/25/2017 2D echocardiogram with ejection fraction of 55-60%    07/16/2017 - 12/12/2017 Chemotherapy    The patient had DOXOrubicin (ADRIAMYCIN) chemo injection 110 mg, 60 mg/m2 = 110 mg, Intravenous,  Once, 4 of 4 cycles Administration: 110 mg (07/18/2017), 110 mg (08/01/2017), 110 mg (08/20/2017), 110 mg (09/03/2017) palonosetron (ALOXI) injection 0.25 mg, 0.25 mg, Intravenous,  Once, 4 of 4 cycles Administration: 0.25 mg (07/18/2017), 0.25 mg (08/01/2017), 0.25 mg (08/20/2017), 0.25 mg (09/03/2017) pegfilgrastim-cbqv (UDENYCA) injection 6 mg, 6 mg, Subcutaneous, Once, 4 of 4 cycles Administration: 6 mg (07/19/2017), 6 mg (08/02/2017), 6 mg (08/22/2017), 6 mg (09/05/2017) cyclophosphamide (CYTOXAN) 1,100 mg in sodium chloride 0.9 % 250 mL chemo infusion, 600 mg/m2 = 1,100 mg, Intravenous,  Once, 4 of 4 cycles Administration: 1,100 mg (07/18/2017), 1,100 mg (08/01/2017), 1,100 mg (08/20/2017), 1,100 mg (09/03/2017) PACLitaxel (TAXOL) 120 mg in sodium chloride 0.9 % 250 mL chemo infusion (</= 54m/m2), 64 mg/m2 = 120 mg (80 % of original dose 80 mg/m2), Intravenous,  Once, 12 of 12  cycles Dose modification: 64 mg/m2 (80 % of original dose 80 mg/m2, Cycle 5, Reason: Provider Judgment), 60 mg/m2 (75 % of original dose 80 mg/m2, Cycle 15, Reason: Other (see comments)), 40 mg/m2 (50 % of original dose 80 mg/m2, Cycle 16, Reason: Other (see comments)) Administration: 120 mg (09/17/2017), 144 mg (09/24/2017), 144 mg (10/03/2017), 144 mg (10/11/2017), 144 mg (10/18/2017), 144 mg (10/25/2017), 144 mg (11/01/2017), 144 mg (11/08/2017), 144 mg (11/15/2017), 144 mg (11/22/2017), 108 mg (11/29/2017), 72 mg (12/06/2017) fosaprepitant (EMEND) 150 mg, dexamethasone (DECADRON) 12 mg in sodium chloride 0.9 % 145 mL IVPB, , Intravenous,  Once, 4 of 4 cycles Administration:  (07/18/2017),  (08/01/2017),  (08/20/2017),  (09/03/2017)  for chemotherapy treatment.     01/31/2018 -  Chemotherapy    The patient had trastuzumab (HERCEPTIN) 600 mg in sodium chloride 0.9 % 250 mL chemo infusion, 567 mg, Intravenous,  Once, 2 of 18 cycles Administration: 600 mg (02/07/2018)  for chemotherapy treatment.       CANCER STAGING: Cancer Staging Breast cancer of upper-outer quadrant of left female breast ([facility] Staging form: Breast, AJCC 8th Edition - Clinical stage from 06/22/2017: Stage IIIB (cT3, cN1, cM0, G3, ER+, PR-, HER2-) - Signed by KDerek Jack MD on 06/22/2017    INTERVAL HISTORY:  Ms. GFraley89y.o. female returns for routine follow-up for left breast cancer. She is doing well today with no complaints except fatigue. She still has numbness in her fingertips this is stable. She denies any nausea ,vomting, or diarrhea. Denies any new pains or lumps present. Denies any bleeding or easy bruising. Denies any fevers or recent  infections. She reports her appetite and energy level at 100% and she is trying to remain active.     REVIEW OF SYSTEMS:  Review of Systems  Constitutional: Positive for fatigue.  All other systems reviewed and are negative.    PAST MEDICAL/SURGICAL HISTORY:  Past Medical History:   Diagnosis Date  . Cancer [facility])    left breast cancer  . Chest pain   . Hypertension    Past Surgical History:  Procedure Laterality Date  . MASTECTOMY MODIFIED RADICAL Left 12/31/2017   Procedure: LEFT MODIFIED RADICAL MASTECTOMY;  Surgeon: Aviva Signs, MD;  Location: [facility];  Service: General;  Laterality: Left;  . PORTACATH PLACEMENT Right 06/27/2017   Procedure: INSERTION PORT-A-CATH;  Surgeon: Aviva Signs, MD;  Location: [facility];  Service: General;  Laterality: Right;     SOCIAL HISTORY:  Social History   Socioeconomic History  . Marital status: Divorced    Spouse name: Not on file  . Number of children: Not on file  . Years of education: Not on file  . Highest education level: Not on file  Occupational History  . Not on file  Social Needs  . Financial resource strain: Somewhat hard  . Food insecurity:    Worry: Never true    Inability: Never true  . Transportation needs:    Medical: No    Non-medical: No  Tobacco Use  . Smoking status: Never Smoker  . Smokeless tobacco: Never Used  Substance and Sexual Activity  . Alcohol use: No  . Drug use: No  . Sexual activity: Not Currently  Lifestyle  . Physical activity:    Days per week: 3 days    Minutes per session: 10 min  . Stress: Very much  Relationships  . Social connections:    Talks on phone: More than three times a week    Gets together: More than three times a week    Attends religious service: More than 4 times per year    Active member of club or organization: Yes    Attends meetings of clubs or organizations: Never    Relationship status: Divorced  . Intimate partner violence:    Fear of current or ex partner: Patient refused    Emotionally abused: Patient refused    Physically abused: Patient refused    Forced sexual activity: Patient refused  Other Topics Concern  . Not on file  Social History Narrative  . Not on file    FAMILY HISTORY:  Family History  Problem Relation Age of Onset   . Breast cancer Mother   . Thyroid disease Mother   . Heart disease Mother   . Heart attack Father   . Heart attack Sister   . Hypertension Sister   . Heart attack Brother   . Cancer Sister   . Stroke Brother   . Alzheimer's disease Maternal Aunt     CURRENT MEDICATIONS:  Outpatient Encounter Medications as of 02/27/2018  Medication Sig  . amLODipine (NORVASC) 5 MG tablet Take 1 tablet (5 mg total) by mouth daily.  Marland Kitchen anastrozole (ARIMIDEX) 1 MG tablet Take 1 tablet (1 mg total) by mouth daily.  . ciprofloxacin (CIPRO) 500 MG tablet Take 1 tablet (500 mg total) by mouth 2 (two) times daily.  Marland Kitchen HYDROcodone-acetaminophen (NORCO) 5-325 MG tablet Take 1 tablet by mouth every 4 (four) hours as needed for moderate pain.  . metoprolol tartrate (LOPRESSOR) 50 MG tablet Take 50 mg by mouth 2 (two) times daily.  Marland Kitchen  ondansetron (ZOFRAN) 8 MG tablet Take 1 tablet (8 mg total) by mouth 2 (two) times daily as needed. Start on the third day after chemotherapy.  . prochlorperazine (COMPAZINE) 10 MG tablet Take 1 tablet (10 mg total) by mouth every 6 (six) hours as needed (Nausea or vomiting).  . Trastuzumab (HERCEPTIN IV) Inject into the vein.   Facility-Administered Encounter Medications as of 02/27/2018  Medication  . [COMPLETED] 0.9 %  sodium chloride infusion  . [COMPLETED] acetaminophen (TYLENOL) tablet 650 mg  . [COMPLETED] diphenhydrAMINE (BENADRYL) capsule 50 mg  . heparin lock flush 100 unit/mL  . sodium chloride flush (NS) 0.9 % injection 10 mL  . trastuzumab (HERCEPTIN) 450 mg in sodium chloride 0.9 % 250 mL chemo infusion    ALLERGIES:  No Known Allergies   PHYSICAL EXAM:  ECOG Performance status: 1  There were no vitals filed for this visit. There were no vitals filed for this visit.  Physical Exam  Constitutional: She is oriented to person, place, and time. She appears well-developed and well-nourished.  Musculoskeletal: Normal range of motion.  Neurological: She is alert  and oriented to person, place, and time.  Skin: Skin is warm and dry.  Psychiatric: She has a normal mood and affect. Her behavior is normal. Judgment and thought content normal.     LABORATORY DATA:  I have reviewed the labs as listed.  CBC    Component Value Date/Time   WBC 7.0 02/27/2018 0848   RBC 4.00 02/27/2018 0848   HGB 12.6 02/27/2018 0848   HCT 37.1 02/27/2018 0848   PLT 249 02/27/2018 0848   MCV 92.8 02/27/2018 0848   MCH 31.5 02/27/2018 0848   MCHC 34.0 02/27/2018 0848   RDW 11.5 02/27/2018 0848   LYMPHSABS 1.8 02/27/2018 0848   MONOABS 0.6 02/27/2018 0848   EOSABS 0.3 02/27/2018 0848   BASOSABS 0.0 02/27/2018 0848   CMP Latest Ref Rng & Units 02/27/2018 01/31/2018 01/11/2018  Glucose 70 - 99 mg/dL 157(H) 131(H) 125(H)  BUN 8 - 23 mg/dL _0 Creatinine 0.44 - 1.00 mg/dL 0.91 0.82 0.83  Sodium 135 - 145 mmol/L 139 139 140  Potassium 3.5 - 5.1 mmol/L 4.3 3.9 3.7  Chloride 98 - 111 mmol/L 108 106 107  CO2 22 - 32 mmol/L _1 Calcium 8.9 - 10.3 mg/dL 9.4 9.3 9.2  Total Protein 6.5 - 8.1 g/dL 7.5 7.4 7.2  Total Bilirubin 0.3 - 1.2 mg/dL 0.3 0.5 0.4  Alkaline Phos 38 - 126 U/L 54 51 49  AST 15 - 41 U/L _2 ALT 0 - 44 U/L _3 DIAGNOSTIC IMAGING:  I have independently reviewed the scans and discussed with the patient.   I have reviewed Francene Finders, NP's note and agree with the documentation.  I personally performed a face-to-face visit, made revisions and my assessment and plan is as follows.     ASSESSMENT & PLAN:   Breast cancer of upper-outer quadrant of left female breast ([place]) 1.  Poorly differentiated left breast cancer stage IIIb, (cT3cN1), ER positive, PR and HER-2 negative: - On 06/12/2017, left breast biopsy with sarcomatoid features, left axillary lymph node biopsy negative.  2D echo shows EF of 55-60%. - CT of the chest showed subcarinal adenopathy, PET CT scan negative for metastatic disease. -4 cycles of dose  dense Adriamycin and cyclophosphamide from 07/18/2017 through 09/03/2017. - Weekly paclitaxel started on 09/17/2017.  Completed on 12/06/2017. -  She underwent left modified radical mastectomy on 12/31/2017. -I have reviewed pathology report with the patient in detail.  This showed 2.3 cm poorly differentiated invasive ductal carcinoma with sarcomatoid changes, resection margins negative, high-grade DCIS, no skin involvement, 0 out of 8 lymph nodes positive, negative for lymphovascular or perineural invasion.  Pathological staging is YpT2YpN0. - Receptors were rechecked on mastectomy specimen.  Her to became positive by IHC.  This was initially negative by [place] on biopsy specimen.  ER was negative on mastectomy specimen.  It was 60% positive on biopsy specimen.  Ki-67 has decreased to 30% from 80% on biopsy.  - Her mastectomy site is well-healed.  I have called Dr. Melina Copa and pathology and had the HER-2 and ER staining reviewed. - This was reviewed by a second pathologist and was confirmed that HER-2 was 3+ positive by IHC and ER was negative.  Dr. Melina Copa told me that the tissue on which the ER receptors were checked was mostly sarcomatoid.  Hence it was staining negative for estrogen.  Ductal part of her cancer was still ER positive. -Based on this I have recommended 1 year of Herceptin.  I did not add on Pertuzumab because of lymph node negativity.  We talked about the side effects of Herceptin in detail including but not limited to diarrhea, rare chance of congestive heart failure. -I have reviewed echocardiogram dated 01/21/2018 which shows ejection fraction of 55 to 60%. - She was started on Herceptin on 02/07/2018.  She tolerated it very well.  She denied any diarrhea. - We reviewed her blood work which was within normal limits.  She may proceed with her second dose of Herceptin today. - We talked about starting her on aromatase inhibitor anastrozole 1 mg daily for at least 5 years.  We talked about the  various side effects including but not limited to hot flashes, muscular skeletal symptoms, decreased bone mineral density.  She was told to start taking calcium and vitamin D twice daily.  We have sent a prescription for her anastrozole. -We will also obtain a baseline bone density test prior to next visit in 9 weeks.  2.  Hypertension: She is continuing metoprolol 50 mg twice daily and Norvasc 5 mg daily.  Blood pressure is well controlled.  3.  Neuropathy: -Neuropathy from paclitaxel with numbness in the fingertips is constant.        Orders placed this encounter:  Orders Placed This Encounter  Procedures  . CBC with Differential/Platelet  . Comprehensive metabolic panel  . CBC with Differential/Platelet  . Comprehensive metabolic panel  . Ferritin  . Iron and TIBC  . Lactate dehydrogenase      Derek Jack, MD [place] [phone]

## 2018-02-27 NOTE — Progress Notes (Signed)
Patient seen by oncologist with lab review and ok to treat today.  No complaints voiced by the patient today.  No s/s of distress noted.   Patient tolerated treatment with no complaints voiced.  Port site clean and dry.  Good blood return noted before and after treatment. Band aid applied.  VSs with discharge and left ambulatory with no s/s of distress noted.

## 2018-02-27 NOTE — Patient Instructions (Signed)
[facility] Discharge Instructions for Patients Receiving Chemotherapy  Today you received the following chemotherapy agents herceptin.    If you develop nausea and vomiting that is not controlled by your nausea medication, call the clinic.   BELOW ARE SYMPTOMS THAT SHOULD BE REPORTED IMMEDIATELY:  *FEVER GREATER THAN 100.5 F  *CHILLS WITH OR WITHOUT FEVER  NAUSEA AND VOMITING THAT IS NOT CONTROLLED WITH YOUR NAUSEA MEDICATION  *UNUSUAL SHORTNESS OF BREATH  *UNUSUAL BRUISING OR BLEEDING  TENDERNESS IN MOUTH AND THROAT WITH OR WITHOUT PRESENCE OF ULCERS  *URINARY PROBLEMS  *BOWEL PROBLEMS  UNUSUAL RASH Items with * indicate a potential emergency and should be followed up as soon as possible.  Feel free to call the clinic should you have any questions or concerns. The clinic phone number is [phone].  Please show the [facility] at check-in to the Emergency Department and triage nurse.

## 2018-02-27 NOTE — Patient Instructions (Signed)
[place] at [facility] Discharge Instructions  Follow up in 9 weeks with labs    Thank you for choosing [place] at [facility] to provide your oncology and hematology care.  To afford each patient quality time with our provider, please arrive at least 15 minutes before your scheduled appointment time.   If you have a lab appointment with the [facility] please come in thru the  Main Entrance and check in at the main information desk  You need to re-schedule your appointment should you arrive 10 or more minutes late.  We strive to give you quality time with our providers, and arriving late affects you and other patients whose appointments are after yours.  Also, if you no show three or more times for appointments you may be dismissed from the clinic at the providers discretion.     Again, thank you for choosing [facility].  Our hope is that these requests will decrease the amount of time that you wait before being seen by our physicians.       _____________________________________________________________  Should you have questions after your visit to [facility], please contact our office at [phone] between the hours of 8:00 a.m. and 4:30 p.m.  Voicemails left after 4:00 p.m. will not be returned until the following business day.  For prescription refill requests, have your pharmacy contact our office and allow 72 hours.    Cancer Center Support Programs:   > Cancer Support Group  2nd Tuesday of the month 1pm-2pm, Journey Room

## 2018-03-05 ENCOUNTER — Encounter (HOSPITAL_COMMUNITY): Payer: Medicaid Other

## 2018-03-07 ENCOUNTER — Ambulatory Visit (HOSPITAL_COMMUNITY)
Admission: RE | Admit: 2018-03-07 | Discharge: 2018-03-07 | Disposition: A | Discharge status: Home / Self Care | Payer: Medicaid Other | Source: Ambulatory Visit | Attending: Nurse Practitioner | Admitting: Nurse Practitioner

## 2018-03-07 DIAGNOSIS — C50412 Malignant neoplasm of upper-outer quadrant of left female breast: Secondary | ICD-10-CM | POA: Insufficient documentation

## 2018-03-07 DIAGNOSIS — Z170 Estrogen receptor positive status [ER+]: Secondary | ICD-10-CM | POA: Diagnosis present

## 2018-03-20 ENCOUNTER — Encounter (HOSPITAL_COMMUNITY): Payer: Self-pay

## 2018-03-20 ENCOUNTER — Inpatient Hospital Stay (HOSPITAL_COMMUNITY): Payer: Medicaid Other | Attending: Hematology

## 2018-03-20 ENCOUNTER — Inpatient Hospital Stay (HOSPITAL_COMMUNITY): Payer: Medicaid Other

## 2018-03-20 ENCOUNTER — Other Ambulatory Visit: Payer: Self-pay

## 2018-03-20 ENCOUNTER — Other Ambulatory Visit (HOSPITAL_COMMUNITY): Payer: Self-pay | Admitting: Nurse Practitioner

## 2018-03-20 VITALS — BP 148/56 | HR 54 | Temp 98.1°F | Resp 18 | Wt 154.2 lb

## 2018-03-20 DIAGNOSIS — Z79899 Other long term (current) drug therapy: Secondary | ICD-10-CM | POA: Diagnosis not present

## 2018-03-20 DIAGNOSIS — C50412 Malignant neoplasm of upper-outer quadrant of left female breast: Secondary | ICD-10-CM

## 2018-03-20 DIAGNOSIS — Z170 Estrogen receptor positive status [ER+]: Principal | ICD-10-CM

## 2018-03-20 DIAGNOSIS — Z5112 Encounter for antineoplastic immunotherapy: Secondary | ICD-10-CM | POA: Insufficient documentation

## 2018-03-20 LAB — CBC WITH DIFFERENTIAL/PLATELET
Abs Immature Granulocytes: 0.02 10*3/uL (ref 0.00–0.07)
Basophils Absolute: 0 10*3/uL (ref 0.0–0.1)
Basophils Relative: 1 %
Eosinophils Absolute: 0.2 10*3/uL (ref 0.0–0.5)
Eosinophils Relative: 4 %
HCT: 36.2 % (ref 36.0–46.0)
Hemoglobin: 12.1 g/dL (ref 12.0–15.0)
Immature Granulocytes: 0 %
Lymphocytes Relative: 30 %
Lymphs Abs: 1.8 10*3/uL (ref 0.7–4.0)
MCH: 30.1 pg (ref 26.0–34.0)
MCHC: 33.4 g/dL (ref 30.0–36.0)
MCV: 90 fL (ref 80.0–100.0)
Monocytes Absolute: 0.5 10*3/uL (ref 0.1–1.0)
Monocytes Relative: 8 %
Neutro Abs: 3.4 10*3/uL (ref 1.7–7.7)
Neutrophils Relative %: 57 %
Platelets: 210 10*3/uL (ref 150–400)
RBC: 4.02 MIL/uL (ref 3.87–5.11)
RDW: 11.9 % (ref 11.5–15.5)
WBC: 6 10*3/uL (ref 4.0–10.5)
nRBC: 0 % (ref 0.0–0.2)

## 2018-03-20 LAB — COMPREHENSIVE METABOLIC PANEL
ALT: 25 U/L (ref 0–44)
AST: 28 U/L (ref 15–41)
Albumin: 4 g/dL (ref 3.5–5.0)
Alkaline Phosphatase: 51 U/L (ref 38–126)
Anion gap: 8 (ref 5–15)
BUN: 13 mg/dL (ref 8–23)
CHLORIDE: 104 mmol/L (ref 98–111)
CO2: 24 mmol/L (ref 22–32)
CREATININE: 0.96 mg/dL (ref 0.44–1.00)
Calcium: 9.4 mg/dL (ref 8.9–10.3)
Glucose, Bld: 116 mg/dL — ABNORMAL HIGH (ref 70–99)
POTASSIUM: 3.8 mmol/L (ref 3.5–5.1)
Sodium: 136 mmol/L (ref 135–145)
Total Bilirubin: 0.7 mg/dL (ref 0.3–1.2)
Total Protein: 7.5 g/dL (ref 6.5–8.1)

## 2018-03-20 MED ORDER — DIPHENHYDRAMINE HCL 25 MG PO CAPS
50.0000 mg | ORAL_CAPSULE | Freq: Once | ORAL | Status: AC
  Administered 2018-03-20: 50 mg via ORAL
  Filled 2018-03-20: qty 2

## 2018-03-20 MED ORDER — SODIUM CHLORIDE 0.9 % IV SOLN
Freq: Once | INTRAVENOUS | Status: AC
  Administered 2018-03-20: 13:00:00 via INTRAVENOUS

## 2018-03-20 MED ORDER — ACETAMINOPHEN 325 MG PO TABS
650.0000 mg | ORAL_TABLET | Freq: Once | ORAL | Status: AC
  Administered 2018-03-20: 650 mg via ORAL
  Filled 2018-03-20: qty 2

## 2018-03-20 MED ORDER — SODIUM CHLORIDE 0.9% FLUSH
10.0000 mL | INTRAVENOUS | Status: DC | PRN
  Administered 2018-03-20: 10 mL
  Filled 2018-03-20: qty 10

## 2018-03-20 MED ORDER — TRASTUZUMAB CHEMO 150 MG IV SOLR
450.0000 mg | Freq: Once | INTRAVENOUS | Status: AC
  Administered 2018-03-20: 450 mg via INTRAVENOUS
  Filled 2018-03-20: qty 21.43

## 2018-03-20 MED ORDER — HEPARIN SOD (PORK) LOCK FLUSH 100 UNIT/ML IV SOLN
500.0000 [IU] | Freq: Once | INTRAVENOUS | Status: DC | PRN
  Filled 2018-03-20: qty 5

## 2018-03-20 NOTE — Patient Instructions (Signed)
[facility] Discharge Instructions for Patients Receiving Chemotherapy  Today you received the following chemotherapy agents   To help prevent nausea and vomiting after your treatment, we encourage you to take your nausea medication   If you develop nausea and vomiting that is not controlled by your nausea medication, call the clinic.   BELOW ARE SYMPTOMS THAT SHOULD BE REPORTED IMMEDIATELY:  *FEVER GREATER THAN 100.5 F  *CHILLS WITH OR WITHOUT FEVER  NAUSEA AND VOMITING THAT IS NOT CONTROLLED WITH YOUR NAUSEA MEDICATION  *UNUSUAL SHORTNESS OF BREATH  *UNUSUAL BRUISING OR BLEEDING  TENDERNESS IN MOUTH AND THROAT WITH OR WITHOUT PRESENCE OF ULCERS  *URINARY PROBLEMS  *BOWEL PROBLEMS  UNUSUAL RASH Items with * indicate a potential emergency and should be followed up as soon as possible.  Feel free to call the clinic should you have any questions or concerns. The clinic phone number is [phone].  Please show the CHEMO ALERT CARD at check-in to the Emergency Department and triage nurse.   

## 2018-03-20 NOTE — Progress Notes (Signed)
Pt presents today for Herceptin. Day1 Cycle 3 every 21. VSS. Pt has no complaints of pain or any changes since the last visit. MAR updated and reviewed with pt.   Treatment given today per MD orders. Tolerated infusion without adverse affects. Vital signs stable. No complaints at this time. Discharged from clinic ambulatory. F/U with [facility] as scheduled.

## 2018-04-10 ENCOUNTER — Encounter (HOSPITAL_COMMUNITY): Payer: Self-pay

## 2018-04-10 ENCOUNTER — Inpatient Hospital Stay (HOSPITAL_COMMUNITY): Payer: Medicaid Other

## 2018-04-10 ENCOUNTER — Inpatient Hospital Stay (HOSPITAL_COMMUNITY): Payer: Medicaid Other | Attending: Hematology

## 2018-04-10 ENCOUNTER — Ambulatory Visit (HOSPITAL_COMMUNITY): Payer: Medicaid Other | Admitting: Hematology

## 2018-04-10 ENCOUNTER — Other Ambulatory Visit (HOSPITAL_COMMUNITY): Payer: Medicaid Other

## 2018-04-10 ENCOUNTER — Ambulatory Visit (HOSPITAL_COMMUNITY): Payer: Medicaid Other

## 2018-04-10 VITALS — BP 150/71 | HR 52 | Temp 98.2°F | Resp 18 | Wt 156.8 lb

## 2018-04-10 DIAGNOSIS — Z170 Estrogen receptor positive status [ER+]: Secondary | ICD-10-CM | POA: Diagnosis not present

## 2018-04-10 DIAGNOSIS — I10 Essential (primary) hypertension: Secondary | ICD-10-CM | POA: Insufficient documentation

## 2018-04-10 DIAGNOSIS — Z5112 Encounter for antineoplastic immunotherapy: Secondary | ICD-10-CM | POA: Insufficient documentation

## 2018-04-10 DIAGNOSIS — C50412 Malignant neoplasm of upper-outer quadrant of left female breast: Secondary | ICD-10-CM | POA: Diagnosis not present

## 2018-04-10 DIAGNOSIS — Z79899 Other long term (current) drug therapy: Secondary | ICD-10-CM | POA: Insufficient documentation

## 2018-04-10 DIAGNOSIS — G620 Drug-induced polyneuropathy: Secondary | ICD-10-CM | POA: Diagnosis not present

## 2018-04-10 DIAGNOSIS — M8580 Other specified disorders of bone density and structure, unspecified site: Secondary | ICD-10-CM | POA: Diagnosis not present

## 2018-04-10 LAB — CBC WITH DIFFERENTIAL/PLATELET
Abs Immature Granulocytes: 0.02 10*3/uL (ref 0.00–0.07)
Basophils Absolute: 0 10*3/uL (ref 0.0–0.1)
Basophils Relative: 0 %
EOS ABS: 0.2 10*3/uL (ref 0.0–0.5)
Eosinophils Relative: 3 %
HCT: 36.4 % (ref 36.0–46.0)
Hemoglobin: 12.2 g/dL (ref 12.0–15.0)
Immature Granulocytes: 0 %
Lymphocytes Relative: 23 %
Lymphs Abs: 1.6 10*3/uL (ref 0.7–4.0)
MCH: 30.1 pg (ref 26.0–34.0)
MCHC: 33.5 g/dL (ref 30.0–36.0)
MCV: 89.9 fL (ref 80.0–100.0)
Monocytes Absolute: 0.5 10*3/uL (ref 0.1–1.0)
Monocytes Relative: 7 %
NRBC: 0 % (ref 0.0–0.2)
Neutro Abs: 4.8 10*3/uL (ref 1.7–7.7)
Neutrophils Relative %: 67 %
Platelets: 203 10*3/uL (ref 150–400)
RBC: 4.05 MIL/uL (ref 3.87–5.11)
RDW: 12.2 % (ref 11.5–15.5)
WBC: 7.2 10*3/uL (ref 4.0–10.5)

## 2018-04-10 LAB — COMPREHENSIVE METABOLIC PANEL
ALT: 48 U/L — ABNORMAL HIGH (ref 0–44)
AST: 46 U/L — ABNORMAL HIGH (ref 15–41)
Albumin: 4 g/dL (ref 3.5–5.0)
Alkaline Phosphatase: 51 U/L (ref 38–126)
Anion gap: 7 (ref 5–15)
BUN: 16 mg/dL (ref 8–23)
CO2: 26 mmol/L (ref 22–32)
Calcium: 9.7 mg/dL (ref 8.9–10.3)
Chloride: 102 mmol/L (ref 98–111)
Creatinine, Ser: 1 mg/dL (ref 0.44–1.00)
GFR calc non Af Amer: 59 mL/min — ABNORMAL LOW (ref >60)
Glucose, Bld: 132 mg/dL — ABNORMAL HIGH (ref 70–99)
Potassium: 4.4 mmol/L (ref 3.5–5.1)
Sodium: 135 mmol/L (ref 135–145)
Total Bilirubin: 0.5 mg/dL (ref 0.3–1.2)
Total Protein: 7.8 g/dL (ref 6.5–8.1)

## 2018-04-10 MED ORDER — ACETAMINOPHEN 325 MG PO TABS
650.0000 mg | ORAL_TABLET | Freq: Once | ORAL | Status: AC
  Administered 2018-04-10: 650 mg via ORAL

## 2018-04-10 MED ORDER — TRASTUZUMAB CHEMO 150 MG IV SOLR
450.0000 mg | Freq: Once | INTRAVENOUS | Status: AC
  Administered 2018-04-10: 450 mg via INTRAVENOUS
  Filled 2018-04-10: qty 21.43

## 2018-04-10 MED ORDER — HEPARIN SOD (PORK) LOCK FLUSH 100 UNIT/ML IV SOLN
500.0000 [IU] | Freq: Once | INTRAVENOUS | Status: AC | PRN
  Administered 2018-04-10: 500 [IU]

## 2018-04-10 MED ORDER — ACETAMINOPHEN 325 MG PO TABS
ORAL_TABLET | ORAL | Status: AC
  Filled 2018-04-10: qty 2

## 2018-04-10 MED ORDER — DIPHENHYDRAMINE HCL 25 MG PO CAPS
ORAL_CAPSULE | ORAL | Status: AC
  Filled 2018-04-10: qty 2

## 2018-04-10 MED ORDER — DIPHENHYDRAMINE HCL 25 MG PO CAPS
50.0000 mg | ORAL_CAPSULE | Freq: Once | ORAL | Status: AC
  Administered 2018-04-10: 50 mg via ORAL

## 2018-04-10 MED ORDER — SODIUM CHLORIDE 0.9 % IV SOLN
Freq: Once | INTRAVENOUS | Status: AC
  Administered 2018-04-10: 14:00:00 via INTRAVENOUS

## 2018-04-10 MED ORDER — SODIUM CHLORIDE 0.9% FLUSH
10.0000 mL | INTRAVENOUS | Status: DC | PRN
  Administered 2018-04-10: 10 mL
  Filled 2018-04-10: qty 10

## 2018-04-10 NOTE — Progress Notes (Signed)
Lisa Thornton tolerated Herceptin infusion well without complaints or incident. Labs reviewed prior to administering this medication. VSS upon discharge. Pt discharged self ambulatory in satisfactory condition

## 2018-04-10 NOTE — Patient Instructions (Signed)
[facility] Discharge Instructions for Patients Receiving Chemotherapy   Beginning January 23rd 2017 lab work for the Cancer Center will be done in the  Main lab at [facility] on 1st floor. If you have a lab appointment with the Cancer Center please come in thru the  Main Entrance and check in at the main information desk   Today you received the following chemotherapy agents Herceptin. Follow-up as scheduled. Call clinic for any questions or concerns  To help prevent nausea and vomiting after your treatment, we encourage you to take your nausea medication.   If you develop nausea and vomiting, or diarrhea that is not controlled by your medication, call the clinic.  The clinic phone number is [phone]. Office hours are Monday-Friday 8:30am-5:00pm.  BELOW ARE SYMPTOMS THAT SHOULD BE REPORTED IMMEDIATELY:  *FEVER GREATER THAN 101.0 F  *CHILLS WITH OR WITHOUT FEVER  NAUSEA AND VOMITING THAT IS NOT CONTROLLED WITH YOUR NAUSEA MEDICATION  *UNUSUAL SHORTNESS OF BREATH  *UNUSUAL BRUISING OR BLEEDING  TENDERNESS IN MOUTH AND THROAT WITH OR WITHOUT PRESENCE OF ULCERS  *URINARY PROBLEMS  *BOWEL PROBLEMS  UNUSUAL RASH Items with * indicate a potential emergency and should be followed up as soon as possible. If you have an emergency after office hours please contact your primary care physician or go to the nearest emergency department.  Please call the clinic during office hours if you have any questions or concerns.   You may also contact the Patient Navigator at [phone] should you have any questions or need assistance in obtaining follow up care.      Resources For Cancer Patients and their Caregivers ? American Cancer Society: Can assist with transportation, wigs, general needs, runs Look Good Feel Better.        1-888-227-6333 ? Cancer Care: Provides financial assistance, online support groups, medication/co-pay assistance.  1-800-813-HOPE  (4673) ? [facility] Assists [place] cancer patients and their families through emotional , educational and financial support.  336-427-4357 ? [place] DSS Where to apply for food stamps, Medicaid and utility assistance. 336-342-1394 ? [facility]: Transportation to medical appointments. 336-347-2287 ? Social Security Administration: May apply for disability if have a Stage IV cancer. 336-342-7796 1-800-772-1213 ? [place] Aging, Disability and Transit Services: Assists with nutrition, care and transit needs. 336-349-2343         

## 2018-04-11 ENCOUNTER — Ambulatory Visit (HOSPITAL_COMMUNITY)
Admission: RE | Admit: 2018-04-11 | Discharge: 2018-04-11 | Disposition: A | Discharge status: Home / Self Care | Payer: Medicaid Other | Source: Ambulatory Visit | Attending: Nurse Practitioner | Admitting: Nurse Practitioner

## 2018-04-11 DIAGNOSIS — C50412 Malignant neoplasm of upper-outer quadrant of left female breast: Secondary | ICD-10-CM | POA: Insufficient documentation

## 2018-04-11 DIAGNOSIS — Z170 Estrogen receptor positive status [ER+]: Secondary | ICD-10-CM | POA: Insufficient documentation

## 2018-04-11 NOTE — Progress Notes (Signed)
*  PRELIMINARY RESULTS* Echocardiogram 2D Echocardiogram has been performed.  Lisa Thornton 04/11/2018, 3:41 PM

## 2018-04-28 IMAGING — CT NM PET TUM IMG INITIAL (PI) SKULL BASE T - THIGH
8 series · 25 of 25 positions shown · non-contrast
Comparison: 06/29/2017

CLINICAL DATA: Initial treatment strategy for breast carcinoma.

EXAM:
NUCLEAR MEDICINE PET SKULL BASE TO THIGH
TECHNIQUE: 8.9 mCi F-18 FDG was injected intravenously. Full-ring PET imaging
was performed from the skull base to thigh after the radiotracer. CT
data was obtained and used for attenuation correction and anatomic
localization.
Fasting blood glucose: 130 mg/dl

[Series 3: pet sk_thigh ac · axial · 5.0mm · 4.07mm/px · z∈[-848,+20]mm · 5 of 218 slices shown]
[im 1/218]
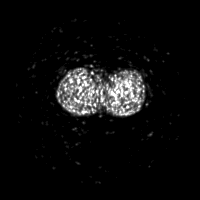
[im 55/218]
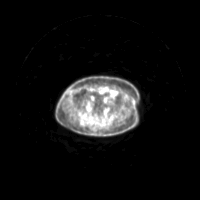
[im 109/218]
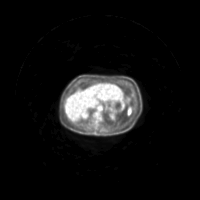
[im 163/218]
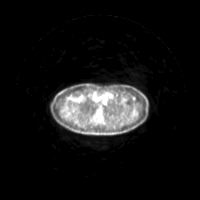
[im 218/218]
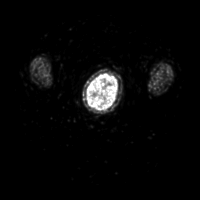

[Series 4: ct sk_thigh 5.0 b31f · axial · 5.0mm · 0.88mm/px · z∈[-848,+20]mm · 5 of 218 slices shown]
[im 1/218]
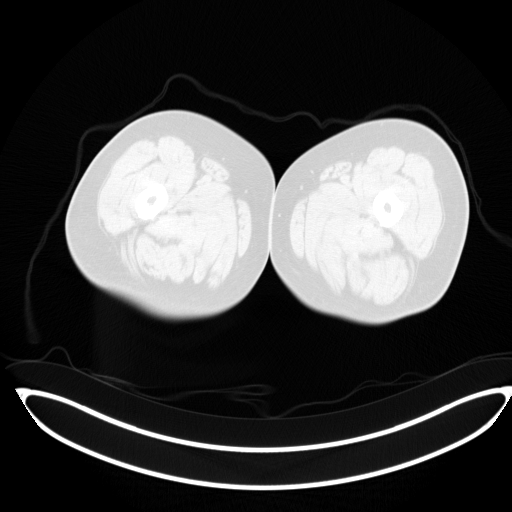
[im 55/218]
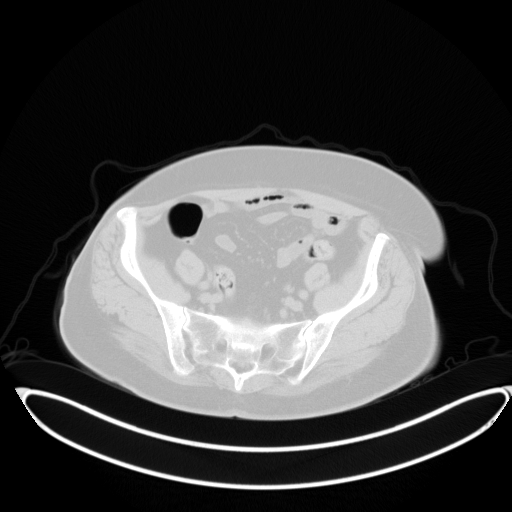
[im 109/218]
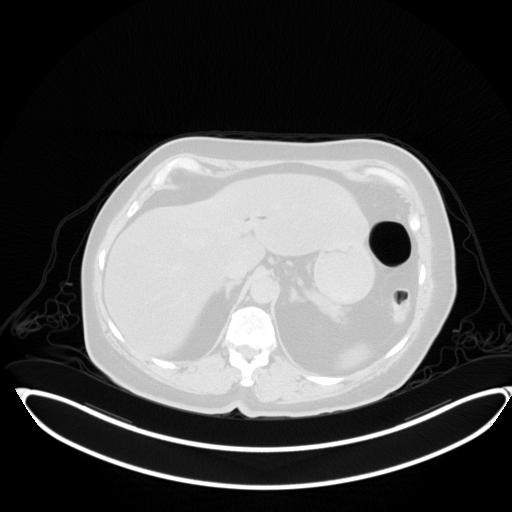
[im 163/218]
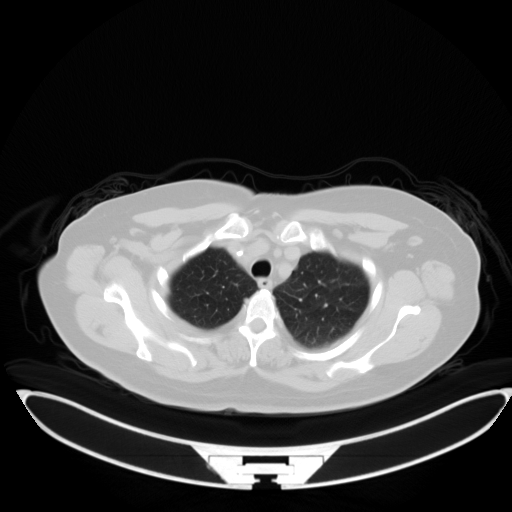
[im 218/218  brain]
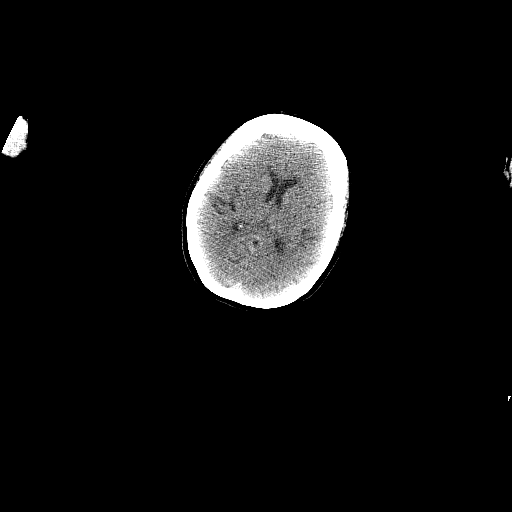

[Series 5: pet sk_thigh nac · axial · 5.0mm · 4.07mm/px · z∈[-848,+20]mm · 5 of 218 slices shown]
[im 1/218]
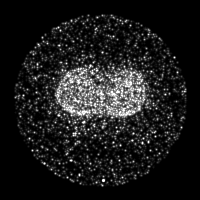
[im 55/218]
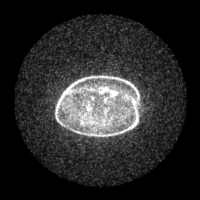
[im 109/218]
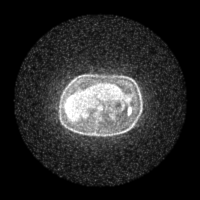
[im 163/218]
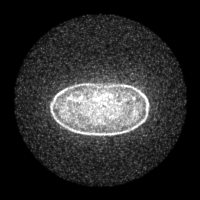
[im 218/218]
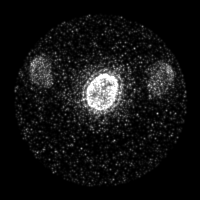

[Series 8: ct sk_thigh 5.0 b70f (id)_bone · axial · 5.0mm · 0.54mm/px · 1 of 62 slices shown]
[im 1/62  bone]
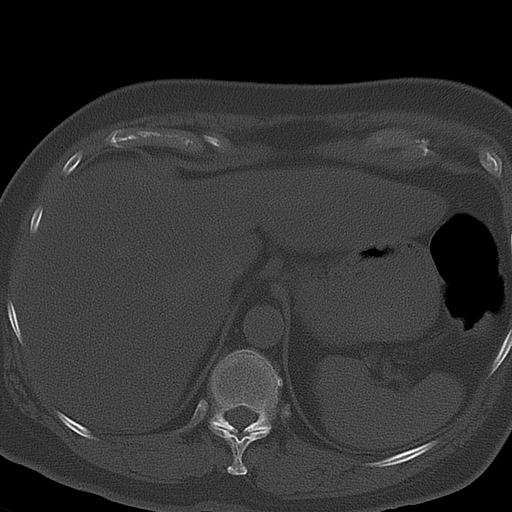

[Series 603: range-ct sk_thigh 5.0 (id)<alpha range> · 2 of 73 slices shown (1 of 2)]
[im 1/73]
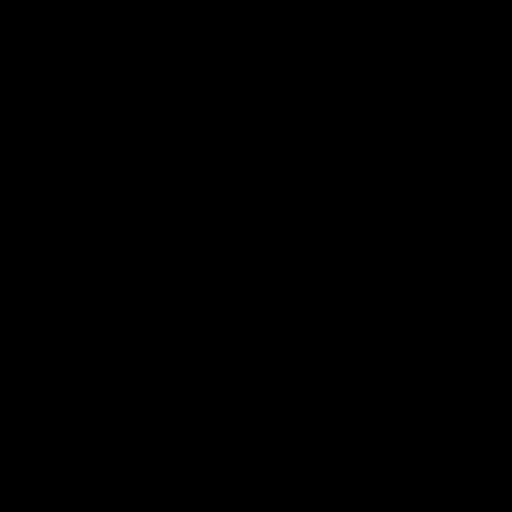
[im 73/73]
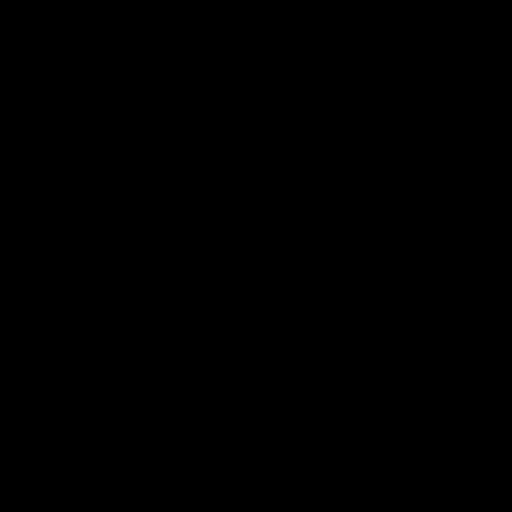

[Series 604: mip range 2 · coronal · 1.80mm/px · 1 of 32 slices shown]
[im 1/32]
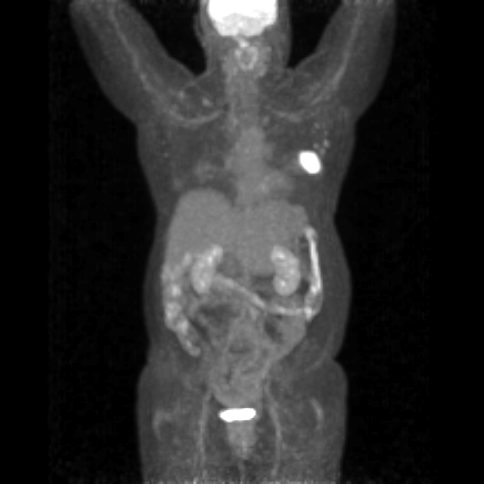

[Series 605: range-ct sk_thigh 5.0 (id)<alpha range> · 5 of 211 slices shown (2 of 2)]
[im 1/211]
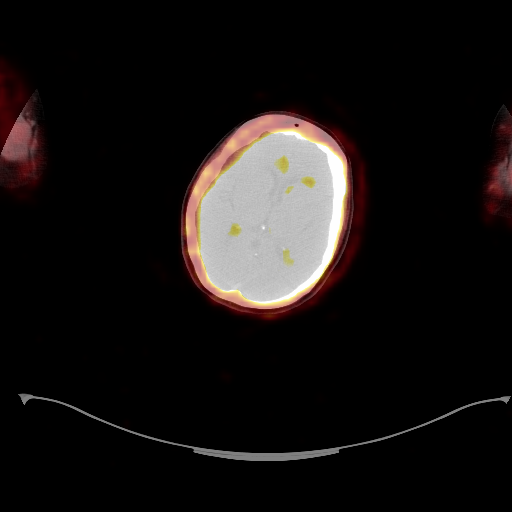
[im 53/211]
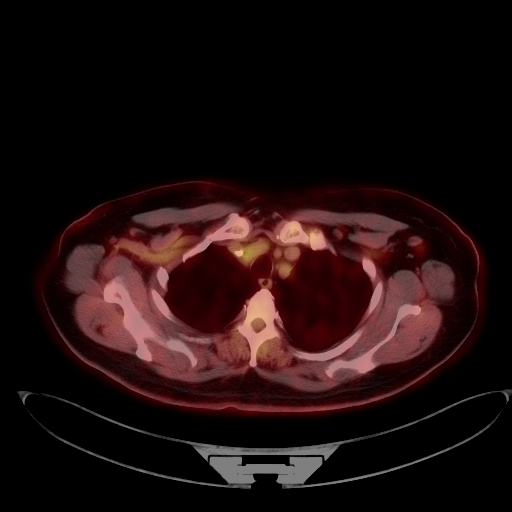
[im 106/211]
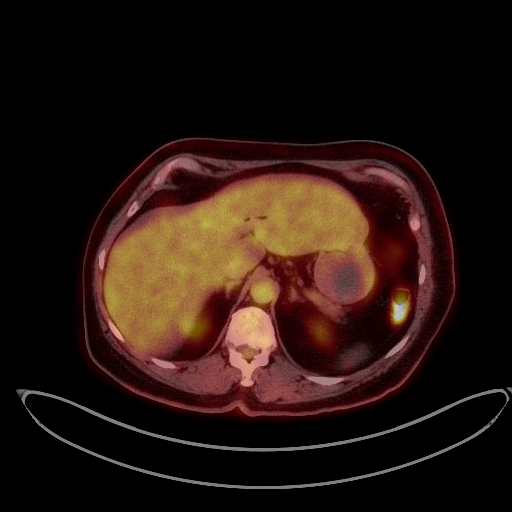
[im 158/211]
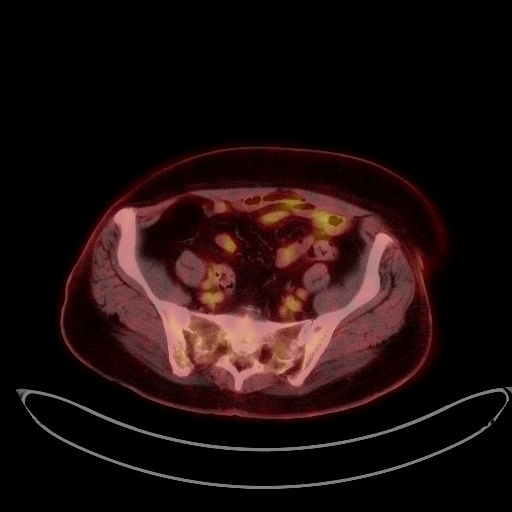
[im 211/211]
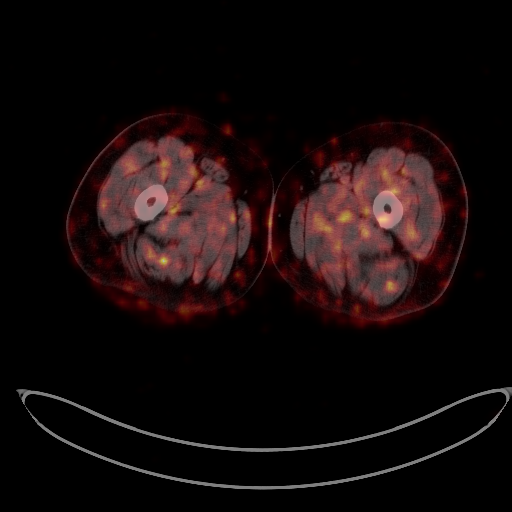

[Series 1070: results mm oncology reading · 0.9mm · 0.81mm/px · 1 of 6 slices shown]
[im 1/6]
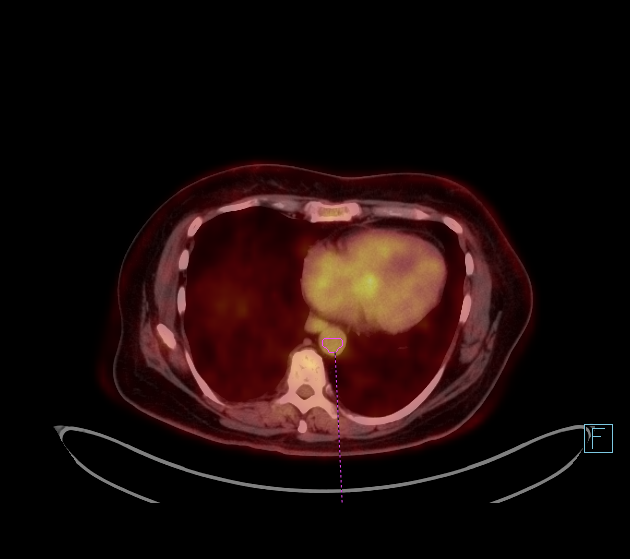

[25 of 25 positions shown; findings below may reference images not displayed]

FINDINGS: Mediastinal blood pool activity: SUV max

NECK: No hypermetabolic lymph nodes in the neck.

Incidental CT findings: none

CHEST: Hypermetabolic mass in the LEFT breast measures 3.5 cm with
SUV max equal 24.2.

Cluster of lymph nodes in the LEFT axilla with moderate metabolic
activity SUV max equal 3.5.

Low metabolic activity associated with a lymph node beneath the
medial aspect of the left pectoralis muscle adjacent to the medial
first rib measuring 5 mm short axis with SUV max equal 1.7.

Very low metabolic activity associated small RIGHT axial lymph node
which normal size measuring 3 mm short axis with SUV max equal 1.6.

No hypermetabolic internal mammary lymph nodes. No enlarged internal
mammary lymph nodes. No hypermetabolic or enlarged mediastinal lymph
nodes or hilar nodes.

Port in the anterior chest wall with tip in distal SVC.

Incidental CT findings: none

ABDOMEN/PELVIS: No abnormal metabolic activity liver. Normal
pancreas spleen and adrenal glands.

Incidental CT findings: Pneumobilia consists with sphincterotomy.
Hepatic steatosis. Uterus and ovaries normal.

SKELETON: No focal hypermetabolic activity to suggest skeletal
metastasis.

Incidental CT findings: none
IMPRESSION: 1. Hypermetabolic mass in the LEFT breast consistent primary
carcinoma.
2. Moderate metabolic activity associated with prominent LEFT
axillary lymph nodes concerning for axillary nodal metastasis.
3. Small lymph node beneath the LEFT pectoralis muscle is
indeterminate.
4. Mild activity associated with the RIGHT axilla is favored benign.
5. No intrathoracic metastasis or pulmonary metastasis.
6. No distant metastatic disease.
7. Hepatic steatosis.
8. Pneumobilia presumably related to sphincterotomy.

## 2018-05-01 ENCOUNTER — Inpatient Hospital Stay (HOSPITAL_BASED_OUTPATIENT_CLINIC_OR_DEPARTMENT_OTHER): Payer: Medicaid Other | Admitting: Hematology

## 2018-05-01 ENCOUNTER — Inpatient Hospital Stay (HOSPITAL_COMMUNITY): Payer: Medicaid Other

## 2018-05-01 ENCOUNTER — Other Ambulatory Visit (HOSPITAL_COMMUNITY): Payer: Self-pay | Admitting: Nurse Practitioner

## 2018-05-01 ENCOUNTER — Encounter (HOSPITAL_COMMUNITY): Payer: Self-pay | Admitting: Hematology

## 2018-05-01 ENCOUNTER — Other Ambulatory Visit: Payer: Self-pay

## 2018-05-01 VITALS — BP 153/71 | HR 62 | Temp 98.4°F | Resp 18 | Wt 160.4 lb

## 2018-05-01 VITALS — BP 153/70 | HR 68 | Temp 98.4°F | Resp 18

## 2018-05-01 DIAGNOSIS — C50412 Malignant neoplasm of upper-outer quadrant of left female breast: Secondary | ICD-10-CM

## 2018-05-01 DIAGNOSIS — N390 Urinary tract infection, site not specified: Secondary | ICD-10-CM

## 2018-05-01 DIAGNOSIS — M8580 Other specified disorders of bone density and structure, unspecified site: Secondary | ICD-10-CM

## 2018-05-01 DIAGNOSIS — Z79811 Long term (current) use of aromatase inhibitors: Secondary | ICD-10-CM

## 2018-05-01 DIAGNOSIS — R350 Frequency of micturition: Secondary | ICD-10-CM

## 2018-05-01 DIAGNOSIS — Z5112 Encounter for antineoplastic immunotherapy: Secondary | ICD-10-CM | POA: Diagnosis not present

## 2018-05-01 DIAGNOSIS — I10 Essential (primary) hypertension: Secondary | ICD-10-CM

## 2018-05-01 DIAGNOSIS — Z170 Estrogen receptor positive status [ER+]: Principal | ICD-10-CM

## 2018-05-01 DIAGNOSIS — G620 Drug-induced polyneuropathy: Secondary | ICD-10-CM

## 2018-05-01 DIAGNOSIS — Z1231 Encounter for screening mammogram for malignant neoplasm of breast: Secondary | ICD-10-CM

## 2018-05-01 LAB — CBC WITH DIFFERENTIAL/PLATELET
Abs Immature Granulocytes: 0.03 10*3/uL (ref 0.00–0.07)
Basophils Absolute: 0 10*3/uL (ref 0.0–0.1)
Basophils Relative: 0 %
EOS ABS: 0.3 10*3/uL (ref 0.0–0.5)
EOS PCT: 4 %
HCT: 33.7 % — ABNORMAL LOW (ref 36.0–46.0)
Hemoglobin: 11.1 g/dL — ABNORMAL LOW (ref 12.0–15.0)
Immature Granulocytes: 0 %
Lymphocytes Relative: 26 %
Lymphs Abs: 1.8 10*3/uL (ref 0.7–4.0)
MCH: 29.8 pg (ref 26.0–34.0)
MCHC: 32.9 g/dL (ref 30.0–36.0)
MCV: 90.3 fL (ref 80.0–100.0)
Monocytes Absolute: 0.5 10*3/uL (ref 0.1–1.0)
Monocytes Relative: 7 %
Neutro Abs: 4.5 10*3/uL (ref 1.7–7.7)
Neutrophils Relative %: 63 %
PLATELETS: 215 10*3/uL (ref 150–400)
RBC: 3.73 MIL/uL — ABNORMAL LOW (ref 3.87–5.11)
RDW: 12.3 % (ref 11.5–15.5)
WBC: 7.2 10*3/uL (ref 4.0–10.5)
nRBC: 0 % (ref 0.0–0.2)

## 2018-05-01 LAB — URINALYSIS, ROUTINE W REFLEX MICROSCOPIC
Bilirubin Urine: NEGATIVE
Glucose, UA: NEGATIVE mg/dL
Ketones, ur: NEGATIVE mg/dL
Nitrite: NEGATIVE
Protein, ur: NEGATIVE mg/dL
Specific Gravity, Urine: 1.016 (ref 1.005–1.030)
WBC, UA: >50 WBC/hpf — ABNORMAL HIGH (ref 0–5)
pH: 5 (ref 5.0–8.0)

## 2018-05-01 LAB — COMPREHENSIVE METABOLIC PANEL
ALT: 32 U/L (ref 0–44)
AST: 30 U/L (ref 15–41)
Albumin: 3.8 g/dL (ref 3.5–5.0)
Alkaline Phosphatase: 47 U/L (ref 38–126)
Anion gap: 7 (ref 5–15)
BUN: 15 mg/dL (ref 8–23)
CO2: 23 mmol/L (ref 22–32)
Calcium: 9.7 mg/dL (ref 8.9–10.3)
Chloride: 106 mmol/L (ref 98–111)
Creatinine, Ser: 0.85 mg/dL (ref 0.44–1.00)
GFR calc Af Amer: >60 mL/min (ref >60)
GFR calc non Af Amer: >60 mL/min (ref >60)
Glucose, Bld: 125 mg/dL — ABNORMAL HIGH (ref 70–99)
Potassium: 4.2 mmol/L (ref 3.5–5.1)
Sodium: 136 mmol/L (ref 135–145)
Total Bilirubin: 0.5 mg/dL (ref 0.3–1.2)
Total Protein: 7.4 g/dL (ref 6.5–8.1)

## 2018-05-01 LAB — LACTATE DEHYDROGENASE: LDH: 125 U/L (ref 98–192)

## 2018-05-01 LAB — IRON AND TIBC
IRON: 103 ug/dL (ref 28–170)
Saturation Ratios: 33 % — ABNORMAL HIGH (ref 10.4–31.8)
TIBC: 314 ug/dL (ref 250–450)
UIBC: 211 ug/dL

## 2018-05-01 LAB — FERRITIN: Ferritin: 321 ng/mL — ABNORMAL HIGH (ref 11–307)

## 2018-05-01 MED ORDER — SODIUM CHLORIDE 0.9% FLUSH
10.0000 mL | INTRAVENOUS | Status: DC | PRN
  Administered 2018-05-01: 10 mL
  Filled 2018-05-01: qty 10

## 2018-05-01 MED ORDER — HEPARIN SOD (PORK) LOCK FLUSH 100 UNIT/ML IV SOLN
500.0000 [IU] | Freq: Once | INTRAVENOUS | Status: AC | PRN
  Administered 2018-05-01: 500 [IU]
  Filled 2018-05-01: qty 5

## 2018-05-01 MED ORDER — SODIUM CHLORIDE 0.9 % IV SOLN
Freq: Once | INTRAVENOUS | Status: AC
  Administered 2018-05-01: 12:00:00 via INTRAVENOUS

## 2018-05-01 MED ORDER — ACETAMINOPHEN 325 MG PO TABS
650.0000 mg | ORAL_TABLET | Freq: Once | ORAL | Status: AC
  Administered 2018-05-01: 650 mg via ORAL
  Filled 2018-05-01: qty 2

## 2018-05-01 MED ORDER — TRASTUZUMAB CHEMO 150 MG IV SOLR
450.0000 mg | Freq: Once | INTRAVENOUS | Status: AC
  Administered 2018-05-01: 450 mg via INTRAVENOUS
  Filled 2018-05-01: qty 21.43

## 2018-05-01 MED ORDER — DIPHENHYDRAMINE HCL 25 MG PO CAPS
50.0000 mg | ORAL_CAPSULE | Freq: Once | ORAL | Status: AC
  Administered 2018-05-01: 50 mg via ORAL
  Filled 2018-05-01: qty 2

## 2018-05-01 MED ORDER — CIPROFLOXACIN HCL 500 MG PO TABS: 500.0000 mg | ORAL_TABLET | Freq: Two times a day (BID) | ORAL | 0 refills | Status: DC

## 2018-05-01 NOTE — Patient Instructions (Signed)
[facility] Discharge Instructions for Patients Receiving Chemotherapy  Today you received the following chemotherapy agents  If you develop nausea and vomiting that is not controlled by your nausea medication, call the clinic.   BELOW ARE SYMPTOMS THAT SHOULD BE REPORTED IMMEDIATELY:  *FEVER GREATER THAN 100.5 F  *CHILLS WITH OR WITHOUT FEVER  NAUSEA AND VOMITING THAT IS NOT CONTROLLED WITH YOUR NAUSEA MEDICATION  *UNUSUAL SHORTNESS OF BREATH  *UNUSUAL BRUISING OR BLEEDING  TENDERNESS IN MOUTH AND THROAT WITH OR WITHOUT PRESENCE OF ULCERS  *URINARY PROBLEMS  *BOWEL PROBLEMS  UNUSUAL RASH Items with * indicate a potential emergency and should be followed up as soon as possible.  Feel free to call the clinic should you have any questions or concerns. The clinic phone number is [phone].  Please show the CHEMO ALERT CARD at check-in to the Emergency Department and triage nurse.   

## 2018-05-01 NOTE — Patient Instructions (Signed)
[facility] Discharge Instructions     Thank you for choosing [facility] at [facility] to provide your oncology and hematology care.  To afford each patient quality time with our provider, please arrive at least 15 minutes before your scheduled appointment time.   If you have a lab appointment with the Cancer Center please come in thru the  Main Entrance and check in at the main information desk  You need to re-schedule your appointment should you arrive 10 or more minutes late.  We strive to give you quality time with our providers, and arriving late affects you and other patients whose appointments are after yours.  Also, if you no show three or more times for appointments you may be dismissed from the clinic at the providers discretion.     Again, thank you for choosing [facility].  Our hope is that these requests will decrease the amount of time that you wait before being seen by our physicians.       _____________________________________________________________  Should you have questions after your visit to [facility], please contact our office at [phone] between the hours of 8:00 a.m. and 4:30 p.m.  Voicemails left after 4:00 p.m. will not be returned until the following business day.  For prescription refill requests, have your pharmacy contact our office and allow 72 hours.    Cancer Center Support Programs:   > Cancer Support Group  2nd Tuesday of the month 1pm-2pm, Journey Room    

## 2018-05-01 NOTE — Progress Notes (Signed)
To treatment room for herceptin and urine culture.  Patient stated she is having burning with voiding.  Urine culture and urinalysis to be collected.  Patient drinking water and aware she needs to collect a specimen today before she leaves.  No other complaints voiced.  No s/s of distress noted.  Ok to treat today verbal order Dr. Delton Coombes.     Patient tolerated chemotherapy with no complaints voiced.  Port site clean and dry with no bruising or swelling noted at site.  Good blood return noted before and after administration of chemotherapy.  Band aid applied.  Patient left ambulatory with VSS and no s/s of distress noted. Patient instructed to call the CC by 1500 if she has not heard from [place] regarding the urinalysis.  Understanding verbalized.

## 2018-05-01 NOTE — Assessment & Plan Note (Addendum)
1.  Poorly differentiated left breast cancer stage IIIb, (cT3cN1), ER positive, PR and HER-2 negative: - On 06/12/2017, left breast biopsy with sarcomatoid features, left axillary lymph node biopsy negative.  2D echo shows EF of 55-60%. - CT of the chest showed subcarinal adenopathy, PET CT scan negative for metastatic disease. -4 cycles of dose dense Adriamycin and cyclophosphamide from 07/18/2017 through 09/03/2017. - Weekly paclitaxel started on 09/17/2017.  Completed on 12/06/2017. - She underwent left modified radical mastectomy on 12/31/2017. -I have reviewed pathology report with the patient in detail.  This showed 2.3 cm poorly differentiated invasive ductal carcinoma with sarcomatoid changes, resection margins negative, high-grade DCIS, no skin involvement, 0 out of 8 lymph nodes positive, negative for lymphovascular or perineural invasion.  Pathological staging is YpT2YpN0. - Receptors were rechecked on mastectomy specimen.  Her to became positive by IHC.  This was initially negative by [place] on biopsy specimen.  ER was negative on mastectomy specimen.  It was 60% positive on biopsy specimen.  Ki-67 has decreased to 30% from 80% on biopsy.  - Her mastectomy site is well-healed.  I have called Dr. Melina Copa and pathology and had the HER-2 and ER staining reviewed. - This was reviewed by a second pathologist and was confirmed that HER-2 was 3+ positive by IHC and ER was negative.  Dr. Melina Copa told me that the tissue on which the ER receptors were checked was mostly sarcomatoid.  Hence it was staining negative for estrogen.  Ductal part of her cancer was still ER positive. -Based on this I have recommended 1 year of Herceptin.  I did not add on Pertuzumab because of lymph node negativity.  We talked about the side effects of Herceptin in detail including but not limited to diarrhea, rare chance of congestive heart failure. -I have reviewed echocardiogram dated 01/21/2018 which shows ejection fraction of 55 to  60%. - She was started on Herceptin on 02/07/2018.  Does report occasional diarrhea. -Anastrozole 1 mg started on 02/27/2018.  She is tolerating it very well without any hot flashes or musculoskeletal symptoms.  She is also taking vitamin D supplements. - Echocardiogram on 04/11/2018 shows EF of 60%. -DEXA scan on 03/07/2018 shows T score of -1.8, osteopenia.  We plan to repeat DEXA scan in 2 years.  We will also consider starting her on Prolia at next visit. - Today she complains of suprapubic pain with dysuria for 1 week.  UA today showed moderate blood and moderate leukocytes.  We will call in Cipro 500 twice a day. -Physical examination shows left mastectomy site is within normal limits.  Right breast has no palpable masses.  We will schedule right breast mammogram end of February. -I will see her back in 9 weeks for follow-up.  2.  Hypertension: -She will continue metoprolol 50 mg twice daily and Norvasc 5 mg daily.   3.  Neuropathy: -Neuropathy from paclitaxel with numbness in the fingertips is constant.

## 2018-05-01 NOTE — Progress Notes (Signed)
Nurse practitioner made aware of urinalysis.  Stated she would check and call in a medication if needed.  Called the patient and notified of plan with understanding verbalized.

## 2018-05-01 NOTE — Progress Notes (Signed)
[address], [place] [id number]   CLINIC:  Medical Oncology/Hematology  PCP:  Sandria Manly [facility] [address] [address] [phone]   REASON FOR VISIT: Follow-up for left breast cancer  CURRENT THERAPY: Herceptin and arimidex  BRIEF ONCOLOGIC HISTORY:    Breast cancer of upper-outer quadrant of left female breast ([place])   06/22/2017 Initial Diagnosis    Breast cancer of upper-outer quadrant of left female breast ([place])    06/22/2017 Cancer Staging    Staging form: Breast, AJCC 8th Edition - Clinical stage from 06/22/2017: Stage IIIB (cT3, cN1, cM0, G3, ER+, PR-, HER2-) - Signed by Derek Jack, MD on 06/22/2017    06/29/2017 Imaging    CT chest showing left axillary adenopathy, 1.2 cm anterior carinal adenopathy, left subpectoral lymph node subcentimeter  06/25/2017 2D echocardiogram with ejection fraction of 55-60%    07/16/2017 - 12/12/2017 Chemotherapy    The patient had DOXOrubicin (ADRIAMYCIN) chemo injection 110 mg, 60 mg/m2 = 110 mg, Intravenous,  Once, 4 of 4 cycles Administration: 110 mg (07/18/2017), 110 mg (08/01/2017), 110 mg (08/20/2017), 110 mg (09/03/2017) palonosetron (ALOXI) injection 0.25 mg, 0.25 mg, Intravenous,  Once, 4 of 4 cycles Administration: 0.25 mg (07/18/2017), 0.25 mg (08/01/2017), 0.25 mg (08/20/2017), 0.25 mg (09/03/2017) pegfilgrastim-cbqv (UDENYCA) injection 6 mg, 6 mg, Subcutaneous, Once, 4 of 4 cycles Administration: 6 mg (07/19/2017), 6 mg (08/02/2017), 6 mg (08/22/2017), 6 mg (09/05/2017) cyclophosphamide (CYTOXAN) 1,100 mg in sodium chloride 0.9 % 250 mL chemo infusion, 600 mg/m2 = 1,100 mg, Intravenous,  Once, 4 of 4 cycles Administration: 1,100 mg (07/18/2017), 1,100 mg (08/01/2017), 1,100 mg (08/20/2017), 1,100 mg (09/03/2017) PACLitaxel (TAXOL) 120 mg in sodium chloride 0.9 % 250 mL chemo infusion (</= 79m/m2), 64 mg/m2 = 120 mg (80 % of original dose 80 mg/m2), Intravenous,  Once, 12 of 12  cycles Dose modification: 64 mg/m2 (80 % of original dose 80 mg/m2, Cycle 5, Reason: Provider Judgment), 60 mg/m2 (75 % of original dose 80 mg/m2, Cycle 15, Reason: Other (see comments)), 40 mg/m2 (50 % of original dose 80 mg/m2, Cycle 16, Reason: Other (see comments)) Administration: 120 mg (09/17/2017), 144 mg (09/24/2017), 144 mg (10/03/2017), 144 mg (10/11/2017), 144 mg (10/18/2017), 144 mg (10/25/2017), 144 mg (11/01/2017), 144 mg (11/08/2017), 144 mg (11/15/2017), 144 mg (11/22/2017), 108 mg (11/29/2017), 72 mg (12/06/2017) fosaprepitant (EMEND) 150 mg, dexamethasone (DECADRON) 12 mg in sodium chloride 0.9 % 145 mL IVPB, , Intravenous,  Once, 4 of 4 cycles Administration:  (07/18/2017),  (08/01/2017),  (08/20/2017),  (09/03/2017)  for chemotherapy treatment.     02/07/2018 -  Chemotherapy    The patient had trastuzumab (HERCEPTIN) 600 mg in sodium chloride 0.9 % 250 mL chemo infusion, 567 mg, Intravenous,  Once, 5 of 18 cycles Administration: 600 mg (02/07/2018), 450 mg (02/27/2018), 450 mg (05/01/2018), 450 mg (03/20/2018), 450 mg (04/10/2018)  for chemotherapy treatment.       CANCER STAGING: Cancer Staging Breast cancer of upper-outer quadrant of left female breast ([facility] Staging form: Breast, AJCC 8th Edition - Clinical stage from 06/22/2017: Stage IIIB (cT3, cN1, cM0, G3, ER+, PR-, HER2-) - Signed by KDerek Jack MD on 06/22/2017    INTERVAL HISTORY:  [name]89y.o. female returns for routine follow-up for left breast cancer. She is here today and tolerating treatments well. She has been having suprapubic pain for one week associated with dysuria. Denies any nausea, vomiting, or diarrhea. Denies any new pains. Had not noticed  any recent bleeding such as epistaxis, hematuria or hematochezia. Denies recent chest pain on exertion, shortness of breath on minimal exertion, pre-syncopal episodes, or palpitations. Denies any numbness or tingling in hands or feet. Denies any recent fevers, infections, or  recent hospitalizations. Patient reports appetite at 100% and energy level at 50%.   REVIEW OF SYSTEMS:  Review of Systems  Gastrointestinal: Positive for abdominal pain.  Genitourinary: Positive for dysuria.   All other systems reviewed and are negative.    PAST MEDICAL/SURGICAL HISTORY:  Past Medical History:  Diagnosis Date  . Cancer [facility])    left breast cancer  . Chest pain   . Hypertension    Past Surgical History:  Procedure Laterality Date  . MASTECTOMY MODIFIED RADICAL Left 12/31/2017   Procedure: LEFT MODIFIED RADICAL MASTECTOMY;  Surgeon: Aviva Signs, MD;  Location: [facility];  Service: General;  Laterality: Left;  . PORTACATH PLACEMENT Right 06/27/2017   Procedure: INSERTION PORT-A-CATH;  Surgeon: Aviva Signs, MD;  Location: [facility];  Service: General;  Laterality: Right;     SOCIAL HISTORY:  Social History   Socioeconomic History  . Marital status: Divorced    Spouse name: Not on file  . Number of children: Not on file  . Years of education: Not on file  . Highest education level: Not on file  Occupational History  . Not on file  Social Needs  . Financial resource strain: Somewhat hard  . Food insecurity:    Worry: Never true    Inability: Never true  . Transportation needs:    Medical: No    Non-medical: No  Tobacco Use  . Smoking status: Never Smoker  . Smokeless tobacco: Never Used  Substance and Sexual Activity  . Alcohol use: No  . Drug use: No  . Sexual activity: Not Currently  Lifestyle  . Physical activity:    Days per week: 3 days    Minutes per session: 10 min  . Stress: Very much  Relationships  . Social connections:    Talks on phone: More than three times a week    Gets together: More than three times a week    Attends religious service: More than 4 times per year    Active member of club or organization: Yes    Attends meetings of clubs or organizations: Never    Relationship status: Divorced  . Intimate partner violence:     Fear of current or ex partner: Patient refused    Emotionally abused: Patient refused    Physically abused: Patient refused    Forced sexual activity: Patient refused  Other Topics Concern  . Not on file  Social History Narrative  . Not on file    FAMILY HISTORY:  Family History  Problem Relation Age of Onset  . Breast cancer Mother   . Thyroid disease Mother   . Heart disease Mother   . Heart attack Father   . Heart attack Sister   . Hypertension Sister   . Heart attack Brother   . Cancer Sister   . Stroke Brother   . Alzheimer's disease Maternal Aunt     CURRENT MEDICATIONS:  Outpatient Encounter Medications as of 05/01/2018  Medication Sig  . amLODipine (NORVASC) 5 MG tablet Take 1 tablet (5 mg total) by mouth daily.  Marland Kitchen anastrozole (ARIMIDEX) 1 MG tablet Take 1 tablet (1 mg total) by mouth daily.  . ciprofloxacin (CIPRO) 500 MG tablet Take 1 tablet (500 mg total) by mouth 2 (two) times  daily.  Marland Kitchen HYDROcodone-acetaminophen (NORCO) 5-325 MG tablet Take 1 tablet by mouth every 4 (four) hours as needed for moderate pain.  . metoprolol tartrate (LOPRESSOR) 50 MG tablet Take 50 mg by mouth 2 (two) times daily.  . ondansetron (ZOFRAN) 8 MG tablet Take 1 tablet (8 mg total) by mouth 2 (two) times daily as needed. Start on the third day after chemotherapy.  . prochlorperazine (COMPAZINE) 10 MG tablet Take 1 tablet (10 mg total) by mouth every 6 (six) hours as needed (Nausea or vomiting).  . Trastuzumab (HERCEPTIN IV) Inject into the vein.   No facility-administered encounter medications on file as of 05/01/2018.     ALLERGIES:  No Known Allergies   PHYSICAL EXAM:  ECOG Performance status: 1  Vitals:   05/01/18 1100  BP: (!) 153/71  Pulse: 62  Resp: 18  Temp: 98.4 F (36.9 C)  SpO2: 100%   Filed Weights   05/01/18 1100  Weight: 160 lb 6 oz (72.7 kg)    Physical Exam Constitutional:      Appearance: Normal appearance. She is normal weight.  Cardiovascular:      Rate and Rhythm: Normal rate and regular rhythm.     Heart sounds: Normal heart sounds.  Pulmonary:     Effort: Pulmonary effort is normal.     Breath sounds: Normal breath sounds.  Musculoskeletal: Normal range of motion.  Skin:    General: Skin is warm and dry.  Neurological:     Mental Status: She is alert and oriented to person, place, and time. Mental status is at baseline.  Psychiatric:        Mood and Affect: Mood normal.        Behavior: Behavior normal.        Thought Content: Thought content normal.        Judgment: Judgment normal.      LABORATORY DATA:  I have reviewed the labs as listed.  CBC    Component Value Date/Time   WBC 7.2 05/01/2018 1029   RBC 3.73 (L) 05/01/2018 1029   HGB 11.1 (L) 05/01/2018 1029   HCT 33.7 (L) 05/01/2018 1029   PLT 215 05/01/2018 1029   MCV 90.3 05/01/2018 1029   MCH 29.8 05/01/2018 1029   MCHC 32.9 05/01/2018 1029   RDW 12.3 05/01/2018 1029   LYMPHSABS 1.8 05/01/2018 1029   MONOABS 0.5 05/01/2018 1029   EOSABS 0.3 05/01/2018 1029   BASOSABS 0.0 05/01/2018 1029   CMP Latest Ref Rng & Units 05/01/2018 04/10/2018 03/20/2018  Glucose 70 - 99 mg/dL 125(H) 132(H) 116(H)  BUN 8 - 23 mg/dL _0 Creatinine 0.44 - 1.00 mg/dL 0.85 1.00 0.96  Sodium 135 - 145 mmol/L 136 135 136  Potassium 3.5 - 5.1 mmol/L 4.2 4.4 3.8  Chloride 98 - 111 mmol/L 106 102 104  CO2 22 - 32 mmol/L _1 Calcium 8.9 - 10.3 mg/dL 9.7 9.7 9.4  Total Protein 6.5 - 8.1 g/dL 7.4 7.8 7.5  Total Bilirubin 0.3 - 1.2 mg/dL 0.5 0.5 0.7  Alkaline Phos 38 - 126 U/L 47 51 51  AST 15 - 41 U/L 30 46(H) 28  ALT 0 - 44 U/L 32 48(H) 25       DIAGNOSTIC IMAGING:  I have independently reviewed the scans and discussed with the patient.   I have reviewed Francene Finders, NP's note and agree with the documentation.  I personally performed a face-to-face visit, made revisions and my assessment and  plan is as follows.    ASSESSMENT & PLAN:   Breast cancer of  upper-outer quadrant of left female breast ([place]) 1.  Poorly differentiated left breast cancer stage IIIb, (cT3cN1), ER positive, PR and HER-2 negative: - On 06/12/2017, left breast biopsy with sarcomatoid features, left axillary lymph node biopsy negative.  2D echo shows EF of 55-60%. - CT of the chest showed subcarinal adenopathy, PET CT scan negative for metastatic disease. -4 cycles of dose dense Adriamycin and cyclophosphamide from 07/18/2017 through 09/03/2017. - Weekly paclitaxel started on 09/17/2017.  Completed on 12/06/2017. - She underwent left modified radical mastectomy on 12/31/2017. -I have reviewed pathology report with the patient in detail.  This showed 2.3 cm poorly differentiated invasive ductal carcinoma with sarcomatoid changes, resection margins negative, high-grade DCIS, no skin involvement, 0 out of 8 lymph nodes positive, negative for lymphovascular or perineural invasion.  Pathological staging is YpT2YpN0. - Receptors were rechecked on mastectomy specimen.  Her to became positive by IHC.  This was initially negative by [place] on biopsy specimen.  ER was negative on mastectomy specimen.  It was 60% positive on biopsy specimen.  Ki-67 has decreased to 30% from 80% on biopsy.  - Her mastectomy site is well-healed.  I have called Dr. Melina Copa and pathology and had the HER-2 and ER staining reviewed. - This was reviewed by a second pathologist and was confirmed that HER-2 was 3+ positive by IHC and ER was negative.  Dr. Melina Copa told me that the tissue on which the ER receptors were checked was mostly sarcomatoid.  Hence it was staining negative for estrogen.  Ductal part of her cancer was still ER positive. -Based on this I have recommended 1 year of Herceptin.  I did not add on Pertuzumab because of lymph node negativity.  We talked about the side effects of Herceptin in detail including but not limited to diarrhea, rare chance of congestive heart failure. -I have reviewed echocardiogram dated  01/21/2018 which shows ejection fraction of 55 to 60%. - She was started on Herceptin on 02/07/2018.  Does report occasional diarrhea. -Anastrozole 1 mg started on 02/27/2018.  She is tolerating it very well without any hot flashes or musculoskeletal symptoms.  She is also taking vitamin D supplements. - Echocardiogram on 04/11/2018 shows EF of 60%. -DEXA scan on 03/07/2018 shows T score of -1.8, osteopenia.  We plan to repeat DEXA scan in 2 years.  We will also consider starting her on Prolia at next visit. - Today she complains of suprapubic pain with dysuria for 1 week.  UA today showed moderate blood and moderate leukocytes.  We will call in Cipro 500 twice a day. -Physical examination shows left mastectomy site is within normal limits.  Right breast has no palpable masses.  We will schedule right breast mammogram end of February. -I will see her back in 9 weeks for follow-up.  2.  Hypertension: -She will continue metoprolol 50 mg twice daily and Norvasc 5 mg daily.   3.  Neuropathy: -Neuropathy from paclitaxel with numbness in the fingertips is constant.        Orders placed this encounter:  Orders Placed This Encounter  Procedures  . MM Digital Diagnostic Unilat R      Derek Jack, MD [place] [phone]

## 2018-05-03 LAB — URINE CULTURE

## 2018-05-22 ENCOUNTER — Encounter (HOSPITAL_COMMUNITY): Payer: Self-pay

## 2018-05-22 ENCOUNTER — Inpatient Hospital Stay (HOSPITAL_COMMUNITY): Payer: Medicaid Other | Attending: Hematology

## 2018-05-22 ENCOUNTER — Inpatient Hospital Stay (HOSPITAL_COMMUNITY): Payer: Medicaid Other

## 2018-05-22 ENCOUNTER — Other Ambulatory Visit: Payer: Self-pay

## 2018-05-22 VITALS — BP 142/53 | HR 52 | Temp 97.5°F | Resp 17 | Wt 157.4 lb

## 2018-05-22 DIAGNOSIS — C50412 Malignant neoplasm of upper-outer quadrant of left female breast: Secondary | ICD-10-CM | POA: Diagnosis not present

## 2018-05-22 DIAGNOSIS — Z170 Estrogen receptor positive status [ER+]: Principal | ICD-10-CM

## 2018-05-22 DIAGNOSIS — Z79899 Other long term (current) drug therapy: Secondary | ICD-10-CM | POA: Insufficient documentation

## 2018-05-22 DIAGNOSIS — Z5112 Encounter for antineoplastic immunotherapy: Secondary | ICD-10-CM | POA: Insufficient documentation

## 2018-05-22 DIAGNOSIS — Z79811 Long term (current) use of aromatase inhibitors: Secondary | ICD-10-CM | POA: Diagnosis not present

## 2018-05-22 LAB — COMPREHENSIVE METABOLIC PANEL
ALT: 28 U/L (ref 0–44)
AST: 31 U/L (ref 15–41)
Albumin: 3.9 g/dL (ref 3.5–5.0)
Alkaline Phosphatase: 53 U/L (ref 38–126)
Anion gap: 10 (ref 5–15)
BUN: 17 mg/dL (ref 8–23)
CO2: 25 mmol/L (ref 22–32)
Calcium: 9.7 mg/dL (ref 8.9–10.3)
Chloride: 101 mmol/L (ref 98–111)
Creatinine, Ser: 0.89 mg/dL (ref 0.44–1.00)
GFR calc Af Amer: >60 mL/min (ref >60)
GFR calc non Af Amer: >60 mL/min (ref >60)
Glucose, Bld: 215 mg/dL — ABNORMAL HIGH (ref 70–99)
Potassium: 4.2 mmol/L (ref 3.5–5.1)
Sodium: 136 mmol/L (ref 135–145)
Total Bilirubin: 0.3 mg/dL (ref 0.3–1.2)
Total Protein: 7.4 g/dL (ref 6.5–8.1)

## 2018-05-22 LAB — CBC WITH DIFFERENTIAL/PLATELET
Abs Immature Granulocytes: 0.02 10*3/uL (ref 0.00–0.07)
Basophils Absolute: 0 10*3/uL (ref 0.0–0.1)
Basophils Relative: 0 %
EOS ABS: 0.2 10*3/uL (ref 0.0–0.5)
Eosinophils Relative: 2 %
HCT: 35.1 % — ABNORMAL LOW (ref 36.0–46.0)
Hemoglobin: 11.7 g/dL — ABNORMAL LOW (ref 12.0–15.0)
Immature Granulocytes: 0 %
Lymphocytes Relative: 25 %
Lymphs Abs: 1.7 10*3/uL (ref 0.7–4.0)
MCH: 30.3 pg (ref 26.0–34.0)
MCHC: 33.3 g/dL (ref 30.0–36.0)
MCV: 90.9 fL (ref 80.0–100.0)
MONOS PCT: 6 %
Monocytes Absolute: 0.4 10*3/uL (ref 0.1–1.0)
Neutro Abs: 4.6 10*3/uL (ref 1.7–7.7)
Neutrophils Relative %: 67 %
Platelets: 199 10*3/uL (ref 150–400)
RBC: 3.86 MIL/uL — ABNORMAL LOW (ref 3.87–5.11)
RDW: 12.4 % (ref 11.5–15.5)
WBC: 6.8 10*3/uL (ref 4.0–10.5)
nRBC: 0 % (ref 0.0–0.2)

## 2018-05-22 MED ORDER — HEPARIN SOD (PORK) LOCK FLUSH 100 UNIT/ML IV SOLN
500.0000 [IU] | Freq: Once | INTRAVENOUS | Status: AC | PRN
  Administered 2018-05-22: 500 [IU]

## 2018-05-22 MED ORDER — SODIUM CHLORIDE 0.9 % IV SOLN
Freq: Once | INTRAVENOUS | Status: AC
  Administered 2018-05-22: 14:00:00 via INTRAVENOUS

## 2018-05-22 MED ORDER — TRASTUZUMAB CHEMO 150 MG IV SOLR
450.0000 mg | Freq: Once | INTRAVENOUS | Status: AC
  Administered 2018-05-22: 450 mg via INTRAVENOUS
  Filled 2018-05-22: qty 21.43

## 2018-05-22 MED ORDER — DIPHENHYDRAMINE HCL 25 MG PO CAPS
50.0000 mg | ORAL_CAPSULE | Freq: Once | ORAL | Status: AC
  Administered 2018-05-22: 50 mg via ORAL

## 2018-05-22 MED ORDER — DIPHENHYDRAMINE HCL 25 MG PO CAPS
ORAL_CAPSULE | ORAL | Status: AC
  Filled 2018-05-22: qty 2

## 2018-05-22 MED ORDER — ACETAMINOPHEN 325 MG PO TABS
650.0000 mg | ORAL_TABLET | Freq: Once | ORAL | Status: AC
  Administered 2018-05-22: 650 mg via ORAL

## 2018-05-22 MED ORDER — SODIUM CHLORIDE 0.9% FLUSH
10.0000 mL | INTRAVENOUS | Status: DC | PRN
  Administered 2018-05-22: 10 mL
  Filled 2018-05-22: qty 10

## 2018-05-22 MED ORDER — ACETAMINOPHEN 325 MG PO TABS
ORAL_TABLET | ORAL | Status: AC
  Filled 2018-05-22: qty 2

## 2018-05-22 NOTE — Progress Notes (Signed)
Pt presents today for Herceptin. VSS. MAR updated and reviewed with patient. No complaints of any changes since the last visit. Labs drawn and WNL.   Treatment given today per MD orders. Tolerated infusion without adverse affects. Vital signs stable. No complaints at this time. Discharged from clinic ambulatory. F/U with [facility] as scheduled.

## 2018-05-22 NOTE — Patient Instructions (Signed)
[facility] Discharge Instructions for Patients Receiving Chemotherapy  Today you received the following chemotherapy agents   To help prevent nausea and vomiting after your treatment, we encourage you to take your nausea medication   If you develop nausea and vomiting that is not controlled by your nausea medication, call the clinic.   BELOW ARE SYMPTOMS THAT SHOULD BE REPORTED IMMEDIATELY:  *FEVER GREATER THAN 100.5 F  *CHILLS WITH OR WITHOUT FEVER  NAUSEA AND VOMITING THAT IS NOT CONTROLLED WITH YOUR NAUSEA MEDICATION  *UNUSUAL SHORTNESS OF BREATH  *UNUSUAL BRUISING OR BLEEDING  TENDERNESS IN MOUTH AND THROAT WITH OR WITHOUT PRESENCE OF ULCERS  *URINARY PROBLEMS  *BOWEL PROBLEMS  UNUSUAL RASH Items with * indicate a potential emergency and should be followed up as soon as possible.  Feel free to call the clinic should you have any questions or concerns. The clinic phone number is [phone].  Please show the CHEMO ALERT CARD at check-in to the Emergency Department and triage nurse.   

## 2018-05-31 ENCOUNTER — Ambulatory Visit (HOSPITAL_COMMUNITY)
Admission: RE | Admit: 2018-05-31 | Discharge: 2018-05-31 | Disposition: A | Discharge status: Home / Self Care | Payer: Medicaid Other | Source: Ambulatory Visit | Attending: Nurse Practitioner | Admitting: Nurse Practitioner

## 2018-05-31 DIAGNOSIS — Z1231 Encounter for screening mammogram for malignant neoplasm of breast: Secondary | ICD-10-CM

## 2018-06-12 ENCOUNTER — Other Ambulatory Visit (HOSPITAL_COMMUNITY): Payer: Medicaid Other

## 2018-06-12 ENCOUNTER — Ambulatory Visit (HOSPITAL_COMMUNITY): Payer: Medicaid Other

## 2018-06-13 ENCOUNTER — Encounter (HOSPITAL_COMMUNITY): Payer: Self-pay

## 2018-06-13 ENCOUNTER — Other Ambulatory Visit (HOSPITAL_COMMUNITY): Payer: Self-pay | Admitting: Hematology

## 2018-06-13 ENCOUNTER — Inpatient Hospital Stay (HOSPITAL_COMMUNITY): Payer: Medicaid Other | Attending: Hematology

## 2018-06-13 ENCOUNTER — Other Ambulatory Visit: Payer: Self-pay

## 2018-06-13 ENCOUNTER — Inpatient Hospital Stay (HOSPITAL_COMMUNITY): Payer: Medicaid Other

## 2018-06-13 VITALS — BP 147/65 | HR 60 | Temp 97.7°F | Resp 18 | Wt 159.0 lb

## 2018-06-13 DIAGNOSIS — Z170 Estrogen receptor positive status [ER+]: Secondary | ICD-10-CM | POA: Diagnosis not present

## 2018-06-13 DIAGNOSIS — R350 Frequency of micturition: Secondary | ICD-10-CM

## 2018-06-13 DIAGNOSIS — Z5112 Encounter for antineoplastic immunotherapy: Secondary | ICD-10-CM | POA: Diagnosis present

## 2018-06-13 DIAGNOSIS — R3915 Urgency of urination: Secondary | ICD-10-CM

## 2018-06-13 DIAGNOSIS — R300 Dysuria: Secondary | ICD-10-CM

## 2018-06-13 DIAGNOSIS — D6481 Anemia due to antineoplastic chemotherapy: Secondary | ICD-10-CM

## 2018-06-13 DIAGNOSIS — Z79899 Other long term (current) drug therapy: Secondary | ICD-10-CM | POA: Diagnosis not present

## 2018-06-13 DIAGNOSIS — T451X5A Adverse effect of antineoplastic and immunosuppressive drugs, initial encounter: Secondary | ICD-10-CM

## 2018-06-13 DIAGNOSIS — M8580 Other specified disorders of bone density and structure, unspecified site: Secondary | ICD-10-CM | POA: Diagnosis not present

## 2018-06-13 DIAGNOSIS — C50412 Malignant neoplasm of upper-outer quadrant of left female breast: Secondary | ICD-10-CM | POA: Insufficient documentation

## 2018-06-13 LAB — URINALYSIS, COMPLETE (UACMP) WITH MICROSCOPIC
Bilirubin Urine: NEGATIVE
Glucose, UA: NEGATIVE mg/dL
Ketones, ur: NEGATIVE mg/dL
Nitrite: NEGATIVE
Protein, ur: 30 mg/dL — AB
Specific Gravity, Urine: 1.023 (ref 1.005–1.030)
WBC, UA: >50 WBC/hpf — ABNORMAL HIGH (ref 0–5)
pH: 5 (ref 5.0–8.0)

## 2018-06-13 LAB — COMPREHENSIVE METABOLIC PANEL
ALT: 30 U/L (ref 0–44)
AST: 28 U/L (ref 15–41)
Albumin: 3.8 g/dL (ref 3.5–5.0)
Alkaline Phosphatase: 56 U/L (ref 38–126)
Anion gap: 9 (ref 5–15)
BUN: 18 mg/dL (ref 8–23)
CO2: 24 mmol/L (ref 22–32)
CREATININE: 0.93 mg/dL (ref 0.44–1.00)
Calcium: 9.4 mg/dL (ref 8.9–10.3)
Chloride: 105 mmol/L (ref 98–111)
Glucose, Bld: 159 mg/dL — ABNORMAL HIGH (ref 70–99)
Potassium: 4 mmol/L (ref 3.5–5.1)
Sodium: 138 mmol/L (ref 135–145)
Total Bilirubin: 0.3 mg/dL (ref 0.3–1.2)
Total Protein: 7.4 g/dL (ref 6.5–8.1)

## 2018-06-13 LAB — CBC WITH DIFFERENTIAL/PLATELET
Abs Immature Granulocytes: 0.03 10*3/uL (ref 0.00–0.07)
Basophils Absolute: 0 10*3/uL (ref 0.0–0.1)
Basophils Relative: 0 %
Eosinophils Absolute: 0.1 10*3/uL (ref 0.0–0.5)
Eosinophils Relative: 2 %
HCT: 34.7 % — ABNORMAL LOW (ref 36.0–46.0)
Hemoglobin: 11.8 g/dL — ABNORMAL LOW (ref 12.0–15.0)
Immature Granulocytes: 0 %
Lymphocytes Relative: 21 %
Lymphs Abs: 1.6 10*3/uL (ref 0.7–4.0)
MCH: 31.5 pg (ref 26.0–34.0)
MCHC: 34 g/dL (ref 30.0–36.0)
MCV: 92.5 fL (ref 80.0–100.0)
Monocytes Absolute: 0.5 10*3/uL (ref 0.1–1.0)
Monocytes Relative: 7 %
Neutro Abs: 5 10*3/uL (ref 1.7–7.7)
Neutrophils Relative %: 70 %
Platelets: 213 10*3/uL (ref 150–400)
RBC: 3.75 MIL/uL — ABNORMAL LOW (ref 3.87–5.11)
RDW: 12 % (ref 11.5–15.5)
WBC: 7.3 10*3/uL (ref 4.0–10.5)
nRBC: 0 % (ref 0.0–0.2)

## 2018-06-13 MED ORDER — DIPHENHYDRAMINE HCL 25 MG PO CAPS
50.0000 mg | ORAL_CAPSULE | Freq: Once | ORAL | Status: AC
  Administered 2018-06-13: 50 mg via ORAL

## 2018-06-13 MED ORDER — ACETAMINOPHEN 325 MG PO TABS
ORAL_TABLET | ORAL | Status: AC
  Filled 2018-06-13: qty 2

## 2018-06-13 MED ORDER — SODIUM CHLORIDE 0.9 % IV SOLN
Freq: Once | INTRAVENOUS | Status: AC
  Administered 2018-06-13: 13:00:00 via INTRAVENOUS

## 2018-06-13 MED ORDER — ACETAMINOPHEN 325 MG PO TABS
650.0000 mg | ORAL_TABLET | Freq: Once | ORAL | Status: AC
  Administered 2018-06-13: 650 mg via ORAL

## 2018-06-13 MED ORDER — DIPHENHYDRAMINE HCL 25 MG PO CAPS
ORAL_CAPSULE | ORAL | Status: AC
  Filled 2018-06-13: qty 2

## 2018-06-13 MED ORDER — CIPROFLOXACIN HCL 500 MG PO TABS: 500.0000 mg | ORAL_TABLET | Freq: Two times a day (BID) | ORAL | 0 refills | Status: DC

## 2018-06-13 MED ORDER — HEPARIN SOD (PORK) LOCK FLUSH 100 UNIT/ML IV SOLN
500.0000 [IU] | Freq: Once | INTRAVENOUS | Status: AC | PRN
  Administered 2018-06-13: 500 [IU]

## 2018-06-13 MED ORDER — TRASTUZUMAB CHEMO 150 MG IV SOLR
450.0000 mg | Freq: Once | INTRAVENOUS | Status: AC
  Administered 2018-06-13: 450 mg via INTRAVENOUS
  Filled 2018-06-13: qty 21.43

## 2018-06-13 MED ORDER — SODIUM CHLORIDE 0.9% FLUSH
10.0000 mL | INTRAVENOUS | Status: DC | PRN
  Administered 2018-06-13 (×2): 10 mL
  Filled 2018-06-13 (×2): qty 10

## 2018-06-13 NOTE — Progress Notes (Signed)
Pt presents today for Herceptin and lab draw. Pt complains of burning with urination. UA collected. Spoke with Dr. Delton Coombes pertaining to pt's complaints. Proceed with treatment per MD.   Treatment given today per MD orders. Tolerated infusion without adverse affects. Vital signs stable. No complaints at this time. Reviewed UA results with Dr. Delton Coombes. Requested script be sent electronically to [place] [place] for UTI infection. Pt called on her cell phone and informed Cipro has been sent to her pharmacy for her to pick up. Discharged from clinic ambulatory. F/U with [facility] as scheduled.

## 2018-06-13 NOTE — Patient Instructions (Signed)
[facility] Discharge Instructions for Patients Receiving Chemotherapy  Today you received the following chemotherapy agents   To help prevent nausea and vomiting after your treatment, we encourage you to take your nausea medication   If you develop nausea and vomiting that is not controlled by your nausea medication, call the clinic.   BELOW ARE SYMPTOMS THAT SHOULD BE REPORTED IMMEDIATELY:  *FEVER GREATER THAN 100.5 F  *CHILLS WITH OR WITHOUT FEVER  NAUSEA AND VOMITING THAT IS NOT CONTROLLED WITH YOUR NAUSEA MEDICATION  *UNUSUAL SHORTNESS OF BREATH  *UNUSUAL BRUISING OR BLEEDING  TENDERNESS IN MOUTH AND THROAT WITH OR WITHOUT PRESENCE OF ULCERS  *URINARY PROBLEMS  *BOWEL PROBLEMS  UNUSUAL RASH Items with * indicate a potential emergency and should be followed up as soon as possible.  Feel free to call the clinic should you have any questions or concerns. The clinic phone number is [phone].  Please show the CHEMO ALERT CARD at check-in to the Emergency Department and triage nurse.   

## 2018-06-14 NOTE — Progress Notes (Signed)
Unable to draw labs from port.  Peripheral IV stick.

## 2018-06-15 LAB — URINE CULTURE: Culture: >=100000 — AB

## 2018-07-03 ENCOUNTER — Other Ambulatory Visit (HOSPITAL_COMMUNITY): Payer: Medicaid Other

## 2018-07-03 ENCOUNTER — Inpatient Hospital Stay (HOSPITAL_COMMUNITY): Payer: Medicaid Other

## 2018-07-03 ENCOUNTER — Encounter (HOSPITAL_COMMUNITY): Payer: Self-pay | Admitting: Hematology

## 2018-07-03 ENCOUNTER — Inpatient Hospital Stay (HOSPITAL_COMMUNITY): Payer: Medicaid Other | Attending: Hematology

## 2018-07-03 ENCOUNTER — Other Ambulatory Visit: Payer: Self-pay

## 2018-07-03 ENCOUNTER — Inpatient Hospital Stay (HOSPITAL_BASED_OUTPATIENT_CLINIC_OR_DEPARTMENT_OTHER): Payer: Medicaid Other | Admitting: Hematology

## 2018-07-03 VITALS — BP 150/60 | HR 56 | Temp 97.8°F | Resp 18

## 2018-07-03 DIAGNOSIS — G620 Drug-induced polyneuropathy: Secondary | ICD-10-CM | POA: Insufficient documentation

## 2018-07-03 DIAGNOSIS — Z170 Estrogen receptor positive status [ER+]: Secondary | ICD-10-CM

## 2018-07-03 DIAGNOSIS — Z5112 Encounter for antineoplastic immunotherapy: Secondary | ICD-10-CM | POA: Insufficient documentation

## 2018-07-03 DIAGNOSIS — I10 Essential (primary) hypertension: Secondary | ICD-10-CM | POA: Diagnosis not present

## 2018-07-03 DIAGNOSIS — M8580 Other specified disorders of bone density and structure, unspecified site: Secondary | ICD-10-CM

## 2018-07-03 DIAGNOSIS — C50412 Malignant neoplasm of upper-outer quadrant of left female breast: Secondary | ICD-10-CM | POA: Diagnosis not present

## 2018-07-03 DIAGNOSIS — Z79899 Other long term (current) drug therapy: Secondary | ICD-10-CM | POA: Diagnosis not present

## 2018-07-03 LAB — COMPREHENSIVE METABOLIC PANEL
ALT: 29 U/L (ref 0–44)
AST: 30 U/L (ref 15–41)
Albumin: 3.7 g/dL (ref 3.5–5.0)
Alkaline Phosphatase: 56 U/L (ref 38–126)
Anion gap: 9 (ref 5–15)
BUN: 15 mg/dL (ref 8–23)
CO2: 27 mmol/L (ref 22–32)
Calcium: 9.9 mg/dL (ref 8.9–10.3)
Chloride: 103 mmol/L (ref 98–111)
Creatinine, Ser: 0.9 mg/dL (ref 0.44–1.00)
GFR calc Af Amer: >60 mL/min (ref >60)
GFR calc non Af Amer: >60 mL/min (ref >60)
Glucose, Bld: 148 mg/dL — ABNORMAL HIGH (ref 70–99)
Potassium: 4.3 mmol/L (ref 3.5–5.1)
Sodium: 139 mmol/L (ref 135–145)
Total Bilirubin: 0.2 mg/dL — ABNORMAL LOW (ref 0.3–1.2)
Total Protein: 7.2 g/dL (ref 6.5–8.1)

## 2018-07-03 LAB — CBC WITH DIFFERENTIAL/PLATELET
Abs Immature Granulocytes: 0.02 10*3/uL (ref 0.00–0.07)
Basophils Absolute: 0 10*3/uL (ref 0.0–0.1)
Basophils Relative: 1 %
Eosinophils Absolute: 0.3 10*3/uL (ref 0.0–0.5)
Eosinophils Relative: 4 %
HCT: 35.1 % — ABNORMAL LOW (ref 36.0–46.0)
Hemoglobin: 12.2 g/dL (ref 12.0–15.0)
Immature Granulocytes: 0 %
Lymphocytes Relative: 34 %
Lymphs Abs: 2.1 10*3/uL (ref 0.7–4.0)
MCH: 31.9 pg (ref 26.0–34.0)
MCHC: 34.8 g/dL (ref 30.0–36.0)
MCV: 91.6 fL (ref 80.0–100.0)
Monocytes Absolute: 0.4 10*3/uL (ref 0.1–1.0)
Monocytes Relative: 7 %
Neutro Abs: 3.4 10*3/uL (ref 1.7–7.7)
Neutrophils Relative %: 54 %
Platelets: 193 10*3/uL (ref 150–400)
RBC: 3.83 MIL/uL — ABNORMAL LOW (ref 3.87–5.11)
RDW: 12 % (ref 11.5–15.5)
WBC: 6.2 10*3/uL (ref 4.0–10.5)
nRBC: 0 % (ref 0.0–0.2)

## 2018-07-03 MED ORDER — DIPHENHYDRAMINE HCL 25 MG PO CAPS
ORAL_CAPSULE | ORAL | Status: AC
  Filled 2018-07-03: qty 2

## 2018-07-03 MED ORDER — SODIUM CHLORIDE 0.9% FLUSH
10.0000 mL | INTRAVENOUS | Status: DC | PRN
  Administered 2018-07-03: 10 mL
  Filled 2018-07-03: qty 10

## 2018-07-03 MED ORDER — DIPHENHYDRAMINE HCL 25 MG PO CAPS
50.0000 mg | ORAL_CAPSULE | Freq: Once | ORAL | Status: AC
  Administered 2018-07-03: 50 mg via ORAL

## 2018-07-03 MED ORDER — ACETAMINOPHEN 325 MG PO TABS
650.0000 mg | ORAL_TABLET | Freq: Once | ORAL | Status: AC
  Administered 2018-07-03: 650 mg via ORAL

## 2018-07-03 MED ORDER — TRASTUZUMAB CHEMO 150 MG IV SOLR
450.0000 mg | Freq: Once | INTRAVENOUS | Status: AC
  Administered 2018-07-03: 450 mg via INTRAVENOUS
  Filled 2018-07-03: qty 21.43

## 2018-07-03 MED ORDER — SODIUM CHLORIDE 0.9 % IV SOLN
Freq: Once | INTRAVENOUS | Status: AC
  Administered 2018-07-03: 12:00:00 via INTRAVENOUS

## 2018-07-03 MED ORDER — HEPARIN SOD (PORK) LOCK FLUSH 100 UNIT/ML IV SOLN
500.0000 [IU] | Freq: Once | INTRAVENOUS | Status: AC | PRN
  Administered 2018-07-03: 13:00:00 500 [IU]

## 2018-07-03 MED ORDER — ACETAMINOPHEN 325 MG PO TABS
ORAL_TABLET | ORAL | Status: AC
  Filled 2018-07-03: qty 2

## 2018-07-03 NOTE — Patient Instructions (Signed)
[facility] Discharge Instructions for Patients Receiving Chemotherapy  Today you received the following chemotherapy agents   To help prevent nausea and vomiting after your treatment, we encourage you to take your nausea medication   If you develop nausea and vomiting that is not controlled by your nausea medication, call the clinic.   BELOW ARE SYMPTOMS THAT SHOULD BE REPORTED IMMEDIATELY:  *FEVER GREATER THAN 100.5 F  *CHILLS WITH OR WITHOUT FEVER  NAUSEA AND VOMITING THAT IS NOT CONTROLLED WITH YOUR NAUSEA MEDICATION  *UNUSUAL SHORTNESS OF BREATH  *UNUSUAL BRUISING OR BLEEDING  TENDERNESS IN MOUTH AND THROAT WITH OR WITHOUT PRESENCE OF ULCERS  *URINARY PROBLEMS  *BOWEL PROBLEMS  UNUSUAL RASH Items with * indicate a potential emergency and should be followed up as soon as possible.  Feel free to call the clinic should you have any questions or concerns. The clinic phone number is [phone].  Please show the CHEMO ALERT CARD at check-in to the Emergency Department and triage nurse.   

## 2018-07-03 NOTE — Progress Notes (Signed)
Lisa Thornton, [place] [id number]   CLINIC:  Medical Oncology/Hematology  PCP:  [facility] [address] [place] [address] [phone]   REASON FOR VISIT:  Follow-up for HER-2 positive breast cancer with ongoing Herceptin.   BRIEF ONCOLOGIC HISTORY:    Breast cancer of upper-outer quadrant of left female breast ([place])   06/22/2017 Initial Diagnosis    Breast cancer of upper-outer quadrant of left female breast ([place])    06/22/2017 Cancer Staging    Staging form: Breast, AJCC 8th Edition - Clinical stage from 06/22/2017: Stage IIIB (cT3, cN1, cM0, G3, ER+, PR-, HER2-) - Signed by Lisa Jack, MD on 06/22/2017    06/29/2017 Imaging    CT chest showing left axillary adenopathy, 1.2 cm anterior carinal adenopathy, left subpectoral lymph node subcentimeter  06/25/2017 2D echocardiogram with ejection fraction of 55-60%    07/16/2017 - 12/12/2017 Chemotherapy    The patient had DOXOrubicin (ADRIAMYCIN) chemo injection 110 mg, 60 mg/m2 = 110 mg, Intravenous,  Once, 4 of 4 cycles Administration: 110 mg (07/18/2017), 110 mg (08/01/2017), 110 mg (08/20/2017), 110 mg (09/03/2017) palonosetron (ALOXI) injection 0.25 mg, 0.25 mg, Intravenous,  Once, 4 of 4 cycles Administration: 0.25 mg (07/18/2017), 0.25 mg (08/01/2017), 0.25 mg (08/20/2017), 0.25 mg (09/03/2017) pegfilgrastim-cbqv (UDENYCA) injection 6 mg, 6 mg, Subcutaneous, Once, 4 of 4 cycles Administration: 6 mg (07/19/2017), 6 mg (08/02/2017), 6 mg (08/22/2017), 6 mg (09/05/2017) cyclophosphamide (CYTOXAN) 1,100 mg in sodium chloride 0.9 % 250 mL chemo infusion, 600 mg/m2 = 1,100 mg, Intravenous,  Once, 4 of 4 cycles Administration: 1,100 mg (07/18/2017), 1,100 mg (08/01/2017), 1,100 mg (08/20/2017), 1,100 mg (09/03/2017) PACLitaxel (TAXOL) 120 mg in sodium chloride 0.9 % 250 mL chemo infusion (</= 73m/m2), 64 mg/m2 = 120 mg (80 % of original dose 80 mg/m2), Intravenous,  Once, 12 of 12 cycles Dose  modification: 64 mg/m2 (80 % of original dose 80 mg/m2, Cycle 5, Reason: Provider Judgment), 60 mg/m2 (75 % of original dose 80 mg/m2, Cycle 15, Reason: Other (see comments)), 40 mg/m2 (50 % of original dose 80 mg/m2, Cycle 16, Reason: Other (see comments)) Administration: 120 mg (09/17/2017), 144 mg (09/24/2017), 144 mg (10/03/2017), 144 mg (10/11/2017), 144 mg (10/18/2017), 144 mg (10/25/2017), 144 mg (11/01/2017), 144 mg (11/08/2017), 144 mg (11/15/2017), 144 mg (11/22/2017), 108 mg (11/29/2017), 72 mg (12/06/2017) fosaprepitant (EMEND) 150 mg, dexamethasone (DECADRON) 12 mg in sodium chloride 0.9 % 145 mL IVPB, , Intravenous,  Once, 4 of 4 cycles Administration:  (07/18/2017),  (08/01/2017),  (08/20/2017),  (09/03/2017)  for chemotherapy treatment.     02/07/2018 -  Chemotherapy    The patient had trastuzumab (HERCEPTIN) 600 mg in sodium chloride 0.9 % 250 mL chemo infusion, 567 mg, Intravenous,  Once, 8 of 18 cycles Administration: 600 mg (02/07/2018), 450 mg (02/27/2018), 450 mg (05/01/2018), 450 mg (05/22/2018), 450 mg (06/13/2018), 450 mg (07/03/2018), 450 mg (03/20/2018), 450 mg (04/10/2018)  for chemotherapy treatment.       CANCER STAGING: Cancer Staging Breast cancer of upper-outer quadrant of left female breast ([facility] Staging form: Breast, AJCC 8th Edition - Clinical stage from 06/22/2017: Stage IIIB (cT3, cN1, cM0, G3, ER+, PR-, HER2-) - Signed by Lisa Jack MD on 06/22/2017    INTERVAL HISTORY:  Ms. [id number]. female returns for routine follow-up. She is here today alone. She states that she is experiencing difficulty sleeping. Denies any nausea, vomiting, or diarrhea. Denies any new pains. Had not noticed any  recent bleeding such as epistaxis, hematuria or hematochezia. Denies recent chest pain on exertion, shortness of breath on minimal exertion, pre-syncopal episodes, or palpitations. Denies any numbness or tingling in hands or feet. Denies any recent fevers, infections, or recent  hospitalizations. Patient reports appetite at 100% and energy level at 25%.     REVIEW OF SYSTEMS:  Review of Systems  Constitutional: Positive for fatigue.  Skin: Positive for itching.     PAST MEDICAL/SURGICAL HISTORY:  Past Medical History:  Diagnosis Date   Cancer [facility])    left breast cancer   Chest pain    Hypertension    Past Surgical History:  Procedure Laterality Date   MASTECTOMY MODIFIED RADICAL Left 12/31/2017   Procedure: LEFT MODIFIED RADICAL MASTECTOMY;  Surgeon: Aviva Signs, MD;  Location: [facility];  Service: General;  Laterality: Left;   PORTACATH PLACEMENT Right 06/27/2017   Procedure: INSERTION PORT-A-CATH;  Surgeon: Aviva Signs, MD;  Location: [facility];  Service: General;  Laterality: Right;     SOCIAL HISTORY:  Social History   Socioeconomic History   Marital status: Divorced    Spouse name: Not on file   Number of children: Not on file   Years of education: Not on file   Highest education level: Not on file  Occupational History   Not on file  Social Needs   Financial resource strain: Somewhat hard   Food insecurity:    Worry: Never true    Inability: Never true   Transportation needs:    Medical: No    Non-medical: No  Tobacco Use   Smoking status: Never Smoker   Smokeless tobacco: Never Used  Substance and Sexual Activity   Alcohol use: No   Drug use: No   Sexual activity: Not Currently  Lifestyle   Physical activity:    Days per week: 3 days    Minutes per session: 10 min   Stress: Very much  Relationships   Social connections:    Talks on phone: More than three times a week    Gets together: More than three times a week    Attends religious service: More than 4 times per year    Active member of club or organization: Yes    Attends meetings of clubs or organizations: Never    Relationship status: Divorced   Intimate partner violence:    Fear of current or ex partner: Patient refused    Emotionally  abused: Patient refused    Physically abused: Patient refused    Forced sexual activity: Patient refused  Other Topics Concern   Not on file  Social History Narrative   Not on file    FAMILY HISTORY:  Family History  Problem Relation Age of Onset   Breast cancer Mother    Thyroid disease Mother    Heart disease Mother    Heart attack Father    Heart attack Sister    Hypertension Sister    Heart attack Brother    Cancer Sister    Stroke Brother    Alzheimer's disease Maternal Aunt     CURRENT MEDICATIONS:  Outpatient Encounter Medications as of 07/03/2018  Medication Sig   amLODipine (NORVASC) 5 MG tablet Take 1 tablet (5 mg total) by mouth daily.   anastrozole (ARIMIDEX) 1 MG tablet Take 1 tablet (1 mg total) by mouth daily.   HYDROcodone-acetaminophen (NORCO) 5-325 MG tablet Take 1 tablet by mouth every 4 (four) hours as needed for moderate pain.   metoprolol tartrate (LOPRESSOR)  50 MG tablet Take 50 mg by mouth 2 (two) times daily.   ondansetron (ZOFRAN) 8 MG tablet Take 1 tablet (8 mg total) by mouth 2 (two) times daily as needed. Start on the third day after chemotherapy.   prochlorperazine (COMPAZINE) 10 MG tablet Take 1 tablet (10 mg total) by mouth every 6 (six) hours as needed (Nausea or vomiting).   Trastuzumab (HERCEPTIN IV) Inject into the vein.   [DISCONTINUED] ciprofloxacin (CIPRO) 500 MG tablet Take 1 tablet (500 mg total) by mouth 2 (two) times daily. (Patient not taking: Reported on 07/03/2018)   [DISCONTINUED] ciprofloxacin (CIPRO) 500 MG tablet Take 1 tablet (500 mg total) by mouth 2 (two) times daily. (Patient not taking: Reported on 07/03/2018)   [DISCONTINUED] ciprofloxacin (CIPRO) 500 MG tablet Take 1 tablet (500 mg total) by mouth 2 (two) times daily. (Patient not taking: Reported on 07/03/2018)   No facility-administered encounter medications on file as of 07/03/2018.     ALLERGIES:  No Known Allergies   PHYSICAL EXAM:  ECOG  Performance status: 1  Vitals:   07/03/18 0941  BP: (!) 145/68  Pulse: 65  Resp: 14  Temp: (!) 96.8 F (36 C)  SpO2: 98%   Filed Weights   07/03/18 0941  Weight: 159 lb 8 oz (72.3 kg)    Physical Exam Vitals signs reviewed.  Constitutional:      Appearance: Normal appearance.  Cardiovascular:     Rate and Rhythm: Normal rate and regular rhythm.     Heart sounds: Normal heart sounds.  Pulmonary:     Effort: Pulmonary effort is normal.     Breath sounds: Normal breath sounds.  Abdominal:     General: There is no distension.     Palpations: Abdomen is soft.     Tenderness: There is no abdominal tenderness.  Musculoskeletal:        General: No swelling.  Skin:    General: Skin is warm.  Neurological:     General: No focal deficit present.     Mental Status: She is alert and oriented to person, place, and time.  Psychiatric:        Mood and Affect: Mood normal.   Left mastectomy site is within normal limits.  Right breast has no palpable masses.   LABORATORY DATA:  I have reviewed the labs as listed.  CBC    Component Value Date/Time   WBC 6.2 07/03/2018 0906   RBC 3.83 (L) 07/03/2018 0906   HGB 12.2 07/03/2018 0906   HCT 35.1 (L) 07/03/2018 0906   PLT 193 07/03/2018 0906   MCV 91.6 07/03/2018 0906   MCH 31.9 07/03/2018 0906   MCHC 34.8 07/03/2018 0906   RDW 12.0 07/03/2018 0906   LYMPHSABS 2.1 07/03/2018 0906   MONOABS 0.4 07/03/2018 0906   EOSABS 0.3 07/03/2018 0906   BASOSABS 0.0 07/03/2018 0906   CMP Latest Ref Rng & Units 07/03/2018 06/13/2018 05/22/2018  Glucose 70 - 99 mg/dL 148(H) 159(H) 215(H)  BUN 8 - 23 mg/dL _0 Creatinine 0.44 - 1.00 mg/dL 0.90 0.93 0.89  Sodium 135 - 145 mmol/L 139 138 136  Potassium 3.5 - 5.1 mmol/L 4.3 4.0 4.2  Chloride 98 - 111 mmol/L 103 105 101  CO2 22 - 32 mmol/L _1 Calcium 8.9 - 10.3 mg/dL 9.9 9.4 9.7  Total Protein 6.5 - 8.1 g/dL 7.2 7.4 7.4  Total Bilirubin 0.3 - 1.2 mg/dL 0.2(L) 0.3 0.3  Alkaline  Phos 38 -  126 U/L 56 56 53  AST 15 - 41 U/L _0 ALT 0 - 44 U/L _1 DIAGNOSTIC IMAGING:  I have independently reviewed the scans and discussed with the patient.   I have reviewed Venita Lick LPN's note and agree with the documentation.  I personally performed a face-to-face visit, made revisions and my assessment and plan is as follows.    ASSESSMENT & PLAN:   Breast cancer of upper-outer quadrant of left female breast ([place]) 1.  Poorly differentiated left breast cancer stage IIIb, (cT3cN1), ER positive, PR and HER-2 negative: - On 06/12/2017, left breast biopsy with sarcomatoid features, left axillary lymph node biopsy negative.  2D echo shows EF of 55-60%. - CT of the chest showed subcarinal adenopathy, PET CT scan negative for metastatic disease. -4 cycles of neoadjuvant dose dense Adriamycin and cyclophosphamide from 07/18/2017 through 09/03/2017. - Cycles of weekly paclitaxel from 09/17/2017 through 12/06/2017. - She underwent left modified radical mastectomy on 12/31/2017. -I have reviewed pathology report with the patient in detail.  This showed 2.3 cm poorly differentiated invasive ductal carcinoma with sarcomatoid changes, resection margins negative, high-grade DCIS, no skin involvement, 0 out of 8 lymph nodes positive, negative for lymphovascular or perineural invasion.  Pathological staging is YpT2YpN0. - Receptors were rechecked on mastectomy specimen.  HER-2 was positive by IHC.  This was initially negative by [place] on biopsy specimen.  ER was negative on mastectomy specimen.  It was 60% positive on biopsy specimen.  Ki-67 has decreased to 30% from 80% on biopsy.  - This was reviewed by a second pathologist and was confirmed that HER-2 was 3+ positive by IHC and ER was negative.  Dr. Melina Copa told me that the tissue on which the ER receptors were checked was mostly sarcomatoid.  Hence it was staining negative for estrogen.  Ductal part of her cancer was still ER  positive. -Based on this I have recommended 1 year of Herceptin.  I did not add on Pertuzumab because of lymph node negativity.  We talked about the side effects of Herceptin in detail including but not limited to diarrhea, rare chance of congestive heart failure. -I have reviewed echocardiogram dated 01/21/2018 which shows ejection fraction of 55 to 60%. - She was started on Herceptin on 02/07/2018.  Does report occasional diarrhea. -Anastrozole 1 mg started on 02/27/2018.  She is tolerating it very well without any hot flashes or musculoskeletal symptoms.  She is also taking vitamin D supplements. - 2D echo on 04/11/2018 shows EF of 60%. -She is tolerating Herceptin very well.  She is also tolerating anastrozole very well.  I have reviewed her labs. -She may proceed with Herceptin today. -I plan to repeat another echocardiogram.  I will see her back in 6 weeks for follow-up. -I reviewed her mammogram dated 05/31/2018 which was BI-RADS 1.  Physical exam today shows left mastectomy site is within normal limits.  Right breast has no palpable masses.  No palpable adenopathy.  2.  Osteopenia: -DEXA scan on 03/07/2018 shows T score of -1.8. -She was counseled to take calcium and vitamin D twice daily.  I plan to repeat DEXA scan in 2 years.  3.  Hypertension: -She will continue metoprolol 50 mg twice daily and Norvasc 5 mg daily.   4.  Neuropathy: -Neuropathy from paclitaxel with numbness in the fingertips is constant.        Orders placed this encounter:  Orders Placed This Encounter  Procedures   ECHO TEE (2 D Echo)      Lisa Jack, MD [place] [phone]

## 2018-07-03 NOTE — Progress Notes (Signed)
Treatment given today per MD orders. Tolerated infusion without adverse affects. Vital signs stable. No complaints at this time. Discharged from clinic ambulatory. F/U with [facility] as scheduled.   

## 2018-07-03 NOTE — Patient Instructions (Addendum)
[place] at [facility] Discharge Instructions  You were seen today by Dr. Delton Coombes. He went over your recent mammogram results. Try taking the melatonin over the counter for sleep. He will see you back in 4 months for labs and follow up.   Thank you for choosing [place] at [facility] to provide your oncology and hematology care.  To afford each patient quality time with our provider, please arrive at least 15 minutes before your scheduled appointment time.   If you have a lab appointment with the [facility] please come in thru the  Main Entrance and check in at the main information desk  You need to re-schedule your appointment should you arrive 10 or more minutes late.  We strive to give you quality time with our providers, and arriving late affects you and other patients whose appointments are after yours.  Also, if you no show three or more times for appointments you may be dismissed from the clinic at the providers discretion.     Again, thank you for choosing [facility].  Our hope is that these requests will decrease the amount of time that you wait before being seen by our physicians.       _____________________________________________________________  Should you have questions after your visit to [facility], please contact our office at [phone] between the hours of 8:00 a.m. and 4:30 p.m.  Voicemails left after 4:00 p.m. will not be returned until the following business day.  For prescription refill requests, have your pharmacy contact our office and allow 72 hours.    Cancer Center Support Programs:   > Cancer Support Group  2nd Tuesday of the month 1pm-2pm, Journey Room

## 2018-07-03 NOTE — Assessment & Plan Note (Signed)
1.  Poorly differentiated left breast cancer stage IIIb, (cT3cN1), ER positive, PR and HER-2 negative: - On 06/12/2017, left breast biopsy with sarcomatoid features, left axillary lymph node biopsy negative.  2D echo shows EF of 55-60%. - CT of the chest showed subcarinal adenopathy, PET CT scan negative for metastatic disease. -4 cycles of neoadjuvant dose dense Adriamycin and cyclophosphamide from 07/18/2017 through 09/03/2017. - Cycles of weekly paclitaxel from 09/17/2017 through 12/06/2017. - She underwent left modified radical mastectomy on 12/31/2017. -I have reviewed pathology report with the patient in detail.  This showed 2.3 cm poorly differentiated invasive ductal carcinoma with sarcomatoid changes, resection margins negative, high-grade DCIS, no skin involvement, 0 out of 8 lymph nodes positive, negative for lymphovascular or perineural invasion.  Pathological staging is YpT2YpN0. - Receptors were rechecked on mastectomy specimen.  HER-2 was positive by IHC.  This was initially negative by [place] on biopsy specimen.  ER was negative on mastectomy specimen.  It was 60% positive on biopsy specimen.  Ki-67 has decreased to 30% from 80% on biopsy.  - This was reviewed by a second pathologist and was confirmed that HER-2 was 3+ positive by IHC and ER was negative.  Dr. Melina Copa told me that the tissue on which the ER receptors were checked was mostly sarcomatoid.  Hence it was staining negative for estrogen.  Ductal part of her cancer was still ER positive. -Based on this I have recommended 1 year of Herceptin.  I did not add on Pertuzumab because of lymph node negativity.  We talked about the side effects of Herceptin in detail including but not limited to diarrhea, rare chance of congestive heart failure. -I have reviewed echocardiogram dated 01/21/2018 which shows ejection fraction of 55 to 60%. - She was started on Herceptin on 02/07/2018.  Does report occasional diarrhea. -Anastrozole 1 mg started on  02/27/2018.  She is tolerating it very well without any hot flashes or musculoskeletal symptoms.  She is also taking vitamin D supplements. - 2D echo on 04/11/2018 shows EF of 60%. -She is tolerating Herceptin very well.  She is also tolerating anastrozole very well.  I have reviewed her labs. -She may proceed with Herceptin today. -I plan to repeat another echocardiogram.  I will see her back in 6 weeks for follow-up. -I reviewed her mammogram dated 05/31/2018 which was BI-RADS 1.  Physical exam today shows left mastectomy site is within normal limits.  Right breast has no palpable masses.  No palpable adenopathy.  2.  Osteopenia: -DEXA scan on 03/07/2018 shows T score of -1.8. -She was counseled to take calcium and vitamin D twice daily.  I plan to repeat DEXA scan in 2 years.  3.  Hypertension: -She will continue metoprolol 50 mg twice daily and Norvasc 5 mg daily.   4.  Neuropathy: -Neuropathy from paclitaxel with numbness in the fingertips is constant.

## 2018-07-04 ENCOUNTER — Other Ambulatory Visit (HOSPITAL_COMMUNITY): Payer: Self-pay | Admitting: *Deleted

## 2018-07-04 DIAGNOSIS — Z170 Estrogen receptor positive status [ER+]: Principal | ICD-10-CM

## 2018-07-04 DIAGNOSIS — C50412 Malignant neoplasm of upper-outer quadrant of left female breast: Secondary | ICD-10-CM

## 2018-07-24 ENCOUNTER — Encounter (HOSPITAL_COMMUNITY): Payer: Self-pay

## 2018-07-24 ENCOUNTER — Inpatient Hospital Stay (HOSPITAL_COMMUNITY): Payer: Medicaid Other | Attending: Hematology

## 2018-07-24 ENCOUNTER — Other Ambulatory Visit: Payer: Self-pay

## 2018-07-24 ENCOUNTER — Inpatient Hospital Stay (HOSPITAL_COMMUNITY): Payer: Medicaid Other

## 2018-07-24 VITALS — BP 138/61 | HR 52 | Temp 97.8°F | Resp 18 | Wt 164.4 lb

## 2018-07-24 DIAGNOSIS — T451X5A Adverse effect of antineoplastic and immunosuppressive drugs, initial encounter: Secondary | ICD-10-CM

## 2018-07-24 DIAGNOSIS — M8580 Other specified disorders of bone density and structure, unspecified site: Secondary | ICD-10-CM | POA: Diagnosis not present

## 2018-07-24 DIAGNOSIS — Z79899 Other long term (current) drug therapy: Secondary | ICD-10-CM | POA: Insufficient documentation

## 2018-07-24 DIAGNOSIS — C50412 Malignant neoplasm of upper-outer quadrant of left female breast: Secondary | ICD-10-CM | POA: Diagnosis not present

## 2018-07-24 DIAGNOSIS — Z170 Estrogen receptor positive status [ER+]: Principal | ICD-10-CM

## 2018-07-24 DIAGNOSIS — D6481 Anemia due to antineoplastic chemotherapy: Secondary | ICD-10-CM

## 2018-07-24 DIAGNOSIS — Z5112 Encounter for antineoplastic immunotherapy: Secondary | ICD-10-CM | POA: Diagnosis not present

## 2018-07-24 LAB — COMPREHENSIVE METABOLIC PANEL
ALT: 28 U/L (ref 0–44)
AST: 28 U/L (ref 15–41)
Albumin: 3.7 g/dL (ref 3.5–5.0)
Alkaline Phosphatase: 51 U/L (ref 38–126)
Anion gap: 9 (ref 5–15)
BUN: 15 mg/dL (ref 8–23)
CO2: 25 mmol/L (ref 22–32)
Calcium: 9.7 mg/dL (ref 8.9–10.3)
Chloride: 103 mmol/L (ref 98–111)
Creatinine, Ser: 0.87 mg/dL (ref 0.44–1.00)
GFR calc Af Amer: >60 mL/min (ref >60)
GFR calc non Af Amer: >60 mL/min (ref >60)
Glucose, Bld: 164 mg/dL — ABNORMAL HIGH (ref 70–99)
Potassium: 3.8 mmol/L (ref 3.5–5.1)
Sodium: 137 mmol/L (ref 135–145)
Total Bilirubin: 0.2 mg/dL — ABNORMAL LOW (ref 0.3–1.2)
Total Protein: 7.4 g/dL (ref 6.5–8.1)

## 2018-07-24 MED ORDER — TRASTUZUMAB CHEMO 150 MG IV SOLR
450.0000 mg | Freq: Once | INTRAVENOUS | Status: AC
  Administered 2018-07-24: 12:00:00 450 mg via INTRAVENOUS
  Filled 2018-07-24: qty 21.43

## 2018-07-24 MED ORDER — ACETAMINOPHEN 325 MG PO TABS
ORAL_TABLET | ORAL | Status: AC
  Filled 2018-07-24: qty 2

## 2018-07-24 MED ORDER — DIPHENHYDRAMINE HCL 25 MG PO CAPS
50.0000 mg | ORAL_CAPSULE | Freq: Once | ORAL | Status: AC
  Administered 2018-07-24: 11:00:00 50 mg via ORAL
  Filled 2018-07-24: qty 2

## 2018-07-24 MED ORDER — SODIUM CHLORIDE 0.9 % IV SOLN
Freq: Once | INTRAVENOUS | Status: AC
  Administered 2018-07-24: 11:00:00 via INTRAVENOUS

## 2018-07-24 MED ORDER — ACETAMINOPHEN 325 MG PO TABS
650.0000 mg | ORAL_TABLET | Freq: Once | ORAL | Status: AC
  Administered 2018-07-24: 650 mg via ORAL

## 2018-07-24 MED ORDER — HEPARIN SOD (PORK) LOCK FLUSH 100 UNIT/ML IV SOLN
500.0000 [IU] | Freq: Once | INTRAVENOUS | Status: AC | PRN
  Administered 2018-07-24: 12:00:00 500 [IU]

## 2018-07-24 MED ORDER — DIPHENHYDRAMINE HCL 25 MG PO CAPS
ORAL_CAPSULE | ORAL | Status: AC
  Filled 2018-07-24: qty 2

## 2018-07-24 MED ORDER — SODIUM CHLORIDE 0.9% FLUSH
10.0000 mL | INTRAVENOUS | Status: DC | PRN
  Administered 2018-07-24: 10 mL
  Filled 2018-07-24: qty 10

## 2018-07-24 NOTE — Patient Instructions (Signed)
[facility] Discharge Instructions for Patients Receiving Chemotherapy  Today you received the following chemotherapy agents   To help prevent nausea and vomiting after your treatment, we encourage you to take your nausea medication   If you develop nausea and vomiting that is not controlled by your nausea medication, call the clinic.   BELOW ARE SYMPTOMS THAT SHOULD BE REPORTED IMMEDIATELY:  *FEVER GREATER THAN 100.5 F  *CHILLS WITH OR WITHOUT FEVER  NAUSEA AND VOMITING THAT IS NOT CONTROLLED WITH YOUR NAUSEA MEDICATION  *UNUSUAL SHORTNESS OF BREATH  *UNUSUAL BRUISING OR BLEEDING  TENDERNESS IN MOUTH AND THROAT WITH OR WITHOUT PRESENCE OF ULCERS  *URINARY PROBLEMS  *BOWEL PROBLEMS  UNUSUAL RASH Items with * indicate a potential emergency and should be followed up as soon as possible.  Feel free to call the clinic should you have any questions or concerns. The clinic phone number is [phone].  Please show the CHEMO ALERT CARD at check-in to the Emergency Department and triage nurse.   

## 2018-07-24 NOTE — Progress Notes (Signed)
Treatment given today per MD orders. Tolerated infusion without adverse affects. Vital signs stable. No complaints at this time. Discharged from clinic ambulatory. F/U with [facility] as scheduled.   

## 2018-07-25 ENCOUNTER — Telehealth: Payer: Self-pay | Admitting: Cardiovascular Disease

## 2018-07-25 NOTE — Telephone Encounter (Signed)
Virtual Visit Pre-Appointment Phone Call  "(Name), I am calling you today to discuss your upcoming appointment. We are currently trying to limit exposure to the virus that causes COVID-19 by seeing patients at home rather than in the office."  1. "What is the BEST phone number to call the day of the visit?" - include this in appointment notes  2. Do you have or have access to (through a family member/friend) a smartphone with video capability that we can use for your visit?" a. If yes - list this number in appt notes as cell (if different from BEST phone #) and list the appointment type as a VIDEO visit in appointment notes b. If no - list the appointment type as a PHONE visit in appointment notes  3. Confirm consent - "In the setting of the current Covid19 crisis, you are scheduled for a (phone or video) visit with your provider on (date) at (time).  Just as we do with many in-office visits, in order for you to participate in this visit, we must obtain consent.  If you'd like, I can send this to your mychart (if signed up) or email for you to review.  Otherwise, I can obtain your verbal consent now.  All virtual visits are billed to your insurance company just like a normal visit would be.  By agreeing to a virtual visit, we'd like you to understand that the technology does not allow for your provider to perform an examination, and thus may limit your provider's ability to fully assess your condition. If your provider identifies any concerns that need to be evaluated in person, we will make arrangements to do so.  Finally, though the technology is pretty good, we cannot assure that it will always work on either your or our end, and in the setting of a video visit, we may have to convert it to a phone-only visit.  In either situation, we cannot ensure that we have a secure connection.  Are you willing to proceed?" STAFF: Did the patient verbally acknowledge consent to telehealth visit? Document  YES/NO here: Yes  4. Advise patient to be prepared - "Two hours prior to your appointment, go ahead and check your blood pressure, pulse, oxygen saturation, and your weight (if you have the equipment to check those) and write them all down. When your visit starts, your provider will ask you for this information. If you have an Apple Watch or Kardia device, please plan to have heart rate information ready on the day of your appointment. Please have a pen and paper handy nearby the day of the visit as well."  5. Give patient instructions for MyChart download to smartphone OR [email or web address] as below if video visit (depending on what platform provider is using)  6. Inform patient they will receive a phone call 15 minutes prior to their appointment time (may be from unknown caller ID) so they should be prepared to answer    TELEPHONE CALL NOTE  Lisa Thornton has been deemed a candidate for a follow-up tele-health visit to limit community exposure during the Covid-19 pandemic. I spoke with the patient via phone to ensure availability of phone/video source, confirm preferred email & phone number, and discuss instructions and expectations.  I reminded Lisa Thornton to be prepared with any vital sign and/or heart rhythm information that could potentially be obtained via home monitoring, at the time of her visit. I reminded Lisa Thornton to expect a phone call prior to  her visit.  Orinda Kenner 07/25/2018 3:50 PM

## 2018-07-29 ENCOUNTER — Other Ambulatory Visit: Payer: Self-pay

## 2018-07-29 ENCOUNTER — Ambulatory Visit (HOSPITAL_COMMUNITY)
Admission: RE | Admit: 2018-07-29 | Discharge: 2018-07-29 | Disposition: A | Discharge status: Home / Self Care | Payer: Medicaid Other | Source: Ambulatory Visit | Attending: Hematology | Admitting: Hematology

## 2018-07-29 DIAGNOSIS — Z170 Estrogen receptor positive status [ER+]: Secondary | ICD-10-CM | POA: Diagnosis not present

## 2018-07-29 DIAGNOSIS — C50412 Malignant neoplasm of upper-outer quadrant of left female breast: Secondary | ICD-10-CM | POA: Diagnosis not present

## 2018-07-29 NOTE — Progress Notes (Signed)
*  PRELIMINARY RESULTS* Echocardiogram 2D Echocardiogram has been performed.  Lisa Thornton 07/29/2018, 12:28 PM

## 2018-08-01 ENCOUNTER — Encounter: Payer: Self-pay | Admitting: Cardiovascular Disease

## 2018-08-01 ENCOUNTER — Telehealth (INDEPENDENT_AMBULATORY_CARE_PROVIDER_SITE_OTHER): Payer: Medicaid Other | Admitting: Cardiovascular Disease

## 2018-08-01 DIAGNOSIS — I10 Essential (primary) hypertension: Secondary | ICD-10-CM

## 2018-08-01 DIAGNOSIS — I480 Paroxysmal atrial fibrillation: Secondary | ICD-10-CM | POA: Diagnosis not present

## 2018-08-01 NOTE — Patient Instructions (Signed)

## 2018-08-01 NOTE — Progress Notes (Signed)
Virtual Visit via Telephone Note   This visit type was conducted due to national recommendations for restrictions regarding the COVID-19 Pandemic (e.g. social distancing) in an effort to limit this patient's exposure and mitigate transmission in our community.  Due to her co-morbid illnesses, this patient is at least at moderate risk for complications without adequate follow up.  This format is felt to be most appropriate for this patient at this time.  The patient did not have access to video technology/had technical difficulties with video requiring transitioning to audio format only (telephone).  All issues noted in this document were discussed and addressed.  No physical exam could be performed with this format.  Please refer to the patient's chart for her  consent to telehealth for [facility].   Evaluation Performed:  Follow-up visit  Date:  08/01/2018   ID:  Lisa Thornton, Lisa Thornton [birth year], MRN [id number]  Patient Location: Home Provider Location: Home  PCP:  [facility]  Cardiologist:  No primary care provider on file.  Electrophysiologist:  None   Chief Complaint:  PAF  History of Present Illness:    Lisa Thornton is a 63 y.o. female with paroxysmal atrial fibrillation.  Past medical history also includes hypertension and poorly differentiated left breast breast cancer for which she underwent left modified radical mastectomy on 12/31/2017.  She has been treated with neoadjuvant chemotherapy as well.  The patient denies any symptoms of chest pain, palpitations, shortness of breath, lightheadedness, dizziness, leg swelling, orthopnea, PND, and syncope.  The patient does not have symptoms concerning for COVID-19 infection (fever, chills, cough, or new shortness of breath).    Past Medical History:  Diagnosis Date  . Cancer [facility])    left breast cancer  . Chest pain   . Hypertension    Past Surgical History:  Procedure Laterality Date  . MASTECTOMY  MODIFIED RADICAL Left 12/31/2017   Procedure: LEFT MODIFIED RADICAL MASTECTOMY;  Surgeon: Aviva Signs, MD;  Location: [facility];  Service: General;  Laterality: Left;  . PORTACATH PLACEMENT Right 06/27/2017   Procedure: INSERTION PORT-A-CATH;  Surgeon: Aviva Signs, MD;  Location: [facility];  Service: General;  Laterality: Right;     Current Meds  Medication Sig  . amLODipine (NORVASC) 5 MG tablet Take 1 tablet (5 mg total) by mouth daily.  Marland Kitchen anastrozole (ARIMIDEX) 1 MG tablet Take 1 tablet (1 mg total) by mouth daily.  Marland Kitchen HYDROcodone-acetaminophen (NORCO) 5-325 MG tablet Take 1 tablet by mouth every 4 (four) hours as needed for moderate pain.  . metoprolol tartrate (LOPRESSOR) 50 MG tablet Take 50 mg by mouth 2 (two) times daily.  . ondansetron (ZOFRAN) 8 MG tablet Take 1 tablet (8 mg total) by mouth 2 (two) times daily as needed. Start on the third day after chemotherapy.  . prochlorperazine (COMPAZINE) 10 MG tablet Take 1 tablet (10 mg total) by mouth every 6 (six) hours as needed (Nausea or vomiting).  . Trastuzumab (HERCEPTIN IV) Inject into the vein.     Allergies:   Patient has no known allergies.   Social History   Tobacco Use  . Smoking status: Never Smoker  . Smokeless tobacco: Never Used  Substance Use Topics  . Alcohol use: No  . Drug use: No     Family Hx: The patient's family history includes Alzheimer's disease in her maternal aunt; Breast cancer in her mother; Cancer in her sister; Heart attack in her brother, father, and sister; Heart disease in her mother;  Hypertension in her sister; Stroke in her brother; Thyroid disease in her mother.  ROS:   Please see the history of present illness.     All other systems reviewed and are negative.   Prior CV studies:   The following studies were reviewed today:  Echocardiogram (07/29/18):   1. The left ventricle has normal systolic function, with an ejection fraction of 55-60%. The cavity size was normal. There is mildly  increased left ventricular wall thickness. Left ventricular diastolic parameters were normal. No evidence of left  ventricular regional wall motion abnormalities.  2. The right ventricle has normal systolic function. The cavity was normal. There is no increase in right ventricular wall thickness. Right ventricular systolic pressure normal with an estimated pressure of 22.2 mmHg.  3. The aortic valve is tricuspid. Mild aortic annular calcification noted.  4. The mitral valve is grossly normal. There is mild mitral annular calcification present.  5. The tricuspid valve is grossly normal.  6. The aortic root is normal in size and structure.  Labs/Other Tests and Data Reviewed:    EKG:  No ECG reviewed.  Recent Labs: 12/31/2017: Magnesium 1.7; TSH 1.412 07/03/2018: Hemoglobin 12.2; Platelets 193 07/24/2018: ALT 28; BUN 15; Creatinine, Ser 0.87; Potassium 3.8; Sodium 137   Recent Lipid Panel No results found for: CHOL, TRIG, HDL, CHOLHDL, LDLCALC, LDLDIRECT  Wt Readings from Last 3 Encounters:  07/24/18 164 lb 6.4 oz (74.6 kg)  07/03/18 159 lb 8 oz (72.3 kg)  06/13/18 158 lb 15.2 oz (72.1 kg)     Objective:    Vital Signs:  There were no vitals taken for this visit.    ASSESSMENT & PLAN:    1.  Paroxysmal atrial fibrillation: She is entirely asymptomatic with respect to palpitations, chest pain, and shortness of breath.  This appears to have been an isolated instance.  She is 63.  Currently, CHADSVASC score is 2 (hypertension and gender) and most recent updated guidelines indicate that for a woman, systemic anticoagulation is indicated when the score is 3.  Left atrial size is normal.  Currently on Lopressor 50 mg twice daily. I told her should she develop palpitations, I would then consider cardiac monitoring at that time.  2.  Hypertension: Will need monitoring.   COVID-19 Education: The signs and symptoms of COVID-19 were discussed with the patient and how to seek care for testing  (follow up with PCP or arrange E-visit).  The importance of social distancing was discussed today.  Time:   Today, I have spent 15 minutes with the patient with telehealth technology discussing the above problems.     Medication Adjustments/Labs and Tests Ordered: Current medicines are reviewed at length with the patient today.  Concerns regarding medicines are outlined above.   Tests Ordered: No orders of the defined types were placed in this encounter.   Medication Changes: No orders of the defined types were placed in this encounter.   Disposition:  Follow up in 6 month(s)  Signed, Kate Sable, MD  08/01/2018 4:53 PM    [facility]

## 2018-08-14 ENCOUNTER — Other Ambulatory Visit (HOSPITAL_COMMUNITY): Payer: Medicaid Other

## 2018-08-14 ENCOUNTER — Ambulatory Visit (HOSPITAL_COMMUNITY): Payer: Medicaid Other | Admitting: Nurse Practitioner

## 2018-08-14 ENCOUNTER — Ambulatory Visit (HOSPITAL_COMMUNITY): Payer: Medicaid Other

## 2018-08-15 ENCOUNTER — Other Ambulatory Visit: Payer: Self-pay

## 2018-08-15 ENCOUNTER — Inpatient Hospital Stay (HOSPITAL_BASED_OUTPATIENT_CLINIC_OR_DEPARTMENT_OTHER): Payer: Medicaid Other | Admitting: Nurse Practitioner

## 2018-08-15 ENCOUNTER — Inpatient Hospital Stay (HOSPITAL_COMMUNITY): Payer: Medicaid Other | Attending: Hematology

## 2018-08-15 ENCOUNTER — Inpatient Hospital Stay (HOSPITAL_COMMUNITY): Payer: Medicaid Other

## 2018-08-15 VITALS — BP 140/61 | HR 54 | Temp 98.1°F | Resp 18

## 2018-08-15 DIAGNOSIS — Z170 Estrogen receptor positive status [ER+]: Secondary | ICD-10-CM

## 2018-08-15 DIAGNOSIS — C50412 Malignant neoplasm of upper-outer quadrant of left female breast: Secondary | ICD-10-CM

## 2018-08-15 DIAGNOSIS — Z79899 Other long term (current) drug therapy: Secondary | ICD-10-CM | POA: Diagnosis not present

## 2018-08-15 DIAGNOSIS — I10 Essential (primary) hypertension: Secondary | ICD-10-CM | POA: Diagnosis not present

## 2018-08-15 DIAGNOSIS — M8580 Other specified disorders of bone density and structure, unspecified site: Secondary | ICD-10-CM | POA: Diagnosis not present

## 2018-08-15 DIAGNOSIS — Z5112 Encounter for antineoplastic immunotherapy: Secondary | ICD-10-CM | POA: Insufficient documentation

## 2018-08-15 LAB — COMPREHENSIVE METABOLIC PANEL
ALT: 34 U/L (ref 0–44)
AST: 36 U/L (ref 15–41)
Albumin: 3.7 g/dL (ref 3.5–5.0)
Alkaline Phosphatase: 50 U/L (ref 38–126)
Anion gap: 10 (ref 5–15)
BUN: 15 mg/dL (ref 8–23)
CO2: 25 mmol/L (ref 22–32)
Calcium: 9.5 mg/dL (ref 8.9–10.3)
Chloride: 102 mmol/L (ref 98–111)
Creatinine, Ser: 0.95 mg/dL (ref 0.44–1.00)
GFR calc Af Amer: >60 mL/min (ref >60)
GFR calc non Af Amer: >60 mL/min (ref >60)
Glucose, Bld: 185 mg/dL — ABNORMAL HIGH (ref 70–99)
Potassium: 3.8 mmol/L (ref 3.5–5.1)
Sodium: 137 mmol/L (ref 135–145)
Total Bilirubin: 0.4 mg/dL (ref 0.3–1.2)
Total Protein: 7.5 g/dL (ref 6.5–8.1)

## 2018-08-15 LAB — CBC WITH DIFFERENTIAL/PLATELET
Abs Immature Granulocytes: 0.04 10*3/uL (ref 0.00–0.07)
Basophils Absolute: 0 10*3/uL (ref 0.0–0.1)
Basophils Relative: 1 %
Eosinophils Absolute: 0.3 10*3/uL (ref 0.0–0.5)
Eosinophils Relative: 4 %
HCT: 35.5 % — ABNORMAL LOW (ref 36.0–46.0)
Hemoglobin: 12.1 g/dL (ref 12.0–15.0)
Immature Granulocytes: 1 %
Lymphocytes Relative: 27 %
Lymphs Abs: 2.1 10*3/uL (ref 0.7–4.0)
MCH: 31.5 pg (ref 26.0–34.0)
MCHC: 34.1 g/dL (ref 30.0–36.0)
MCV: 92.4 fL (ref 80.0–100.0)
Monocytes Absolute: 0.5 10*3/uL (ref 0.1–1.0)
Monocytes Relative: 7 %
Neutro Abs: 4.6 10*3/uL (ref 1.7–7.7)
Neutrophils Relative %: 60 %
Platelets: 198 10*3/uL (ref 150–400)
RBC: 3.84 MIL/uL — ABNORMAL LOW (ref 3.87–5.11)
RDW: 12.2 % (ref 11.5–15.5)
WBC: 7.5 10*3/uL (ref 4.0–10.5)
nRBC: 0 % (ref 0.0–0.2)

## 2018-08-15 MED ORDER — TRASTUZUMAB CHEMO 150 MG IV SOLR
450.0000 mg | Freq: Once | INTRAVENOUS | Status: AC
  Administered 2018-08-15: 450 mg via INTRAVENOUS
  Filled 2018-08-15: qty 21.43

## 2018-08-15 MED ORDER — HEPARIN SOD (PORK) LOCK FLUSH 100 UNIT/ML IV SOLN
500.0000 [IU] | Freq: Once | INTRAVENOUS | Status: AC | PRN
  Administered 2018-08-15: 500 [IU]

## 2018-08-15 MED ORDER — SODIUM CHLORIDE 0.9 % IV SOLN
Freq: Once | INTRAVENOUS | Status: AC
  Administered 2018-08-15: 10:00:00 via INTRAVENOUS

## 2018-08-15 MED ORDER — DIPHENHYDRAMINE HCL 25 MG PO CAPS
50.0000 mg | ORAL_CAPSULE | Freq: Once | ORAL | Status: AC
  Administered 2018-08-15: 50 mg via ORAL
  Filled 2018-08-15: qty 2

## 2018-08-15 MED ORDER — SODIUM CHLORIDE 0.9% FLUSH
10.0000 mL | INTRAVENOUS | Status: DC | PRN
  Administered 2018-08-15: 10 mL
  Filled 2018-08-15: qty 10

## 2018-08-15 MED ORDER — ACETAMINOPHEN 325 MG PO TABS
650.0000 mg | ORAL_TABLET | Freq: Once | ORAL | Status: AC
  Administered 2018-08-15: 650 mg via ORAL
  Filled 2018-08-15: qty 2

## 2018-08-15 NOTE — Patient Instructions (Signed)
[facility] Discharge Instructions for Patients Receiving Chemotherapy   Beginning January 23rd 2017 lab work for the Cancer Center will be done in the  Main lab at [place] on 1st floor. If you have a lab appointment with the Cancer Center please come in thru the  Main Entrance and check in at the main information desk   Today you received the following chemotherapy agents Herceptin. Follow-up as scheduled. Call clinic for any questions or concerns  To help prevent nausea and vomiting after your treatment, we encourage you to take your nausea medication.   If you develop nausea and vomiting, or diarrhea that is not controlled by your medication, call the clinic.  The clinic phone number is [phone]. Office hours are Monday-Friday 8:30am-5:00pm.  BELOW ARE SYMPTOMS THAT SHOULD BE REPORTED IMMEDIATELY:  *FEVER GREATER THAN 101.0 F  *CHILLS WITH OR WITHOUT FEVER  NAUSEA AND VOMITING THAT IS NOT CONTROLLED WITH YOUR NAUSEA MEDICATION  *UNUSUAL SHORTNESS OF BREATH  *UNUSUAL BRUISING OR BLEEDING  TENDERNESS IN MOUTH AND THROAT WITH OR WITHOUT PRESENCE OF ULCERS  *URINARY PROBLEMS  *BOWEL PROBLEMS  UNUSUAL RASH Items with * indicate a potential emergency and should be followed up as soon as possible. If you have an emergency after office hours please contact your primary care physician or go to the nearest emergency department.  Please call the clinic during office hours if you have any questions or concerns.   You may also contact the Patient Navigator at [phone] should you have any questions or need assistance in obtaining follow up care.      Resources For Cancer Patients and their Caregivers ? American Cancer Society: Can assist with transportation, wigs, general needs, runs Look Good Feel Better.        1-888-227-6333 ? Cancer Care: Provides financial assistance, online support groups, medication/co-pay assistance.  1-800-813-HOPE  (4673) ? [facility] Assists [place] cancer patients and their families through emotional , educational and financial support.  336-427-4357 ? [place] DSS Where to apply for food stamps, Medicaid and utility assistance. 336-342-1394 ? [facility]: Transportation to medical appointments. 336-347-2287 ? Social Security Administration: May apply for disability if have a Stage IV cancer. 336-342-7796 1-800-772-1213 ? [place] Aging, Disability and Transit Services: Assists with nutrition, care and transit needs. 336-349-2343         

## 2018-08-15 NOTE — Progress Notes (Signed)
104 Labs reviewed with and pt seen by Francene Finders NP and pt approved for Herceptin infusion today per NP                                                                                      Lisa Thornton tolerated Herceptin infusion well without complaints or incident. VSS upon discharge. Pt discharged self ambulatory in satisfactory condition

## 2018-08-15 NOTE — Patient Instructions (Signed)
[place] at [facility] Discharge Instructions  Follow up every 3 weeks for treatment and labs.   Thank you for choosing [place] at [facility] to provide your oncology and hematology care.  To afford each patient quality time with our provider, please arrive at least 15 minutes before your scheduled appointment time.   If you have a lab appointment with the [facility] please come in thru the  Main Entrance and check in at the main information desk  You need to re-schedule your appointment should you arrive 10 or more minutes late.  We strive to give you quality time with our providers, and arriving late affects you and other patients whose appointments are after yours.  Also, if you no show three or more times for appointments you may be dismissed from the clinic at the providers discretion.     Again, thank you for choosing [facility].  Our hope is that these requests will decrease the amount of time that you wait before being seen by our physicians.       _____________________________________________________________  Should you have questions after your visit to [facility], please contact our office at [phone] between the hours of 8:00 a.m. and 4:30 p.m.  Voicemails left after 4:00 p.m. will not be returned until the following business day.  For prescription refill requests, have your pharmacy contact our office and allow 72 hours.    Cancer Center Support Programs:   > Cancer Support Group  2nd Tuesday of the month 1pm-2pm, Journey Room

## 2018-08-15 NOTE — Assessment & Plan Note (Signed)
1.  Poorly differentiated left breast cancer stage IIIb: - On 06/12/2017 she had a left breast biopsy with sarcomatoid features, left axillary lymph node biopsy was negative. - Her 2D echo showed EF 55 to 60% - CT of the chest showed subcarinal adenopathy. - PET CT scan was negative for metastatic disease. -She had 4 cycles of neoadjuvant dose dense AC from 07/18/2017 through 09/03/2017. - She had 12 cycles of weekly paclitaxel from 09/17/2017 through 12/06/2017. -She underwent a left modified radical mastectomy on 12/31/2017. - The pathology revealed a 2.3 cm poorly differentiated invasive ductal carcinoma with sarcomatoid changes, resection margins negative, high-grade DCIS, no skin involvement, 0 out of 8 lymph nodes positive, negative for lymphovascular or perineural invasion.  ER positive, PR and HER-2 negative.  Pathological staging is [id number]. - Her receptors were rechecked on a mastectomy specimen and HER-2 was positive by IHC.  This was initially negative by [place] on biopsy specimen.  ER was negative on mastectomy specimen.  It was 60% positive on biopsy specimen.  Ki 67 has decreased from 30% to 80% on biopsy.  This was reviewed by a second pathologist and confirmed. - It was recommended she have 1 year of Herceptin. - Echo dated 01/21/2018 showed EF 55 to 60%. -She started on Herceptin 02/07/2018.  She does report occasional diarrhea but no other side effects. - Anastrozole 1 mg started on 02/27/2018.  She is tolerating it very well. - Latest echo on 07/29/2018 showed her EF at 55 to 60%. -Her last mammogram dated 05/31/2018 showed B RADS 1. -Her physical exam today showed mastectomy site within normal limits.  Right breast has no palpable masses or adenopathy. -We will see her back in 6 weeks with repeat labs.  2.  Osteopenia: - Last DEXA scan on 03/07/2018 showed T score of -1.8. -She is taking calcium and vitamin D twice daily. -We will repeat DEXA scan in 2 years.

## 2018-08-15 NOTE — Progress Notes (Signed)
Lisa Thornton, [place] [address]   CLINIC:  Medical Oncology/Hematology  PCP:  Sandria Manly [facility] [address] [place] [address] [phone]   REASON FOR VISIT: Follow-up for breast cancer  CURRENT THERAPY: Herceptin and anastrozole  BRIEF ONCOLOGIC HISTORY:    Breast cancer of upper-outer quadrant of left female breast ([place])   06/22/2017 Initial Diagnosis    Breast cancer of upper-outer quadrant of left female breast ([place])    06/22/2017 Cancer Staging    Staging form: Breast, AJCC 8th Edition - Clinical stage from 06/22/2017: Stage IIIB (cT3, cN1, cM0, G3, ER+, PR-, HER2-) - Signed by Derek Jack, MD on 06/22/2017    06/29/2017 Imaging    CT chest showing left axillary adenopathy, 1.2 cm anterior carinal adenopathy, left subpectoral lymph node subcentimeter  06/25/2017 2D echocardiogram with ejection fraction of 55-60%    07/16/2017 - 12/12/2017 Chemotherapy    The patient had DOXOrubicin (ADRIAMYCIN) chemo injection 110 mg, 60 mg/m2 = 110 mg, Intravenous,  Once, 4 of 4 cycles Administration: 110 mg (07/18/2017), 110 mg (08/01/2017), 110 mg (08/20/2017), 110 mg (09/03/2017) palonosetron (ALOXI) injection 0.25 mg, 0.25 mg, Intravenous,  Once, 4 of 4 cycles Administration: 0.25 mg (07/18/2017), 0.25 mg (08/01/2017), 0.25 mg (08/20/2017), 0.25 mg (09/03/2017) pegfilgrastim-cbqv (UDENYCA) injection 6 mg, 6 mg, Subcutaneous, Once, 4 of 4 cycles Administration: 6 mg (07/19/2017), 6 mg (08/02/2017), 6 mg (08/22/2017), 6 mg (09/05/2017) cyclophosphamide (CYTOXAN) 1,100 mg in sodium chloride 0.9 % 250 mL chemo infusion, 600 mg/m2 = 1,100 mg, Intravenous,  Once, 4 of 4 cycles Administration: 1,100 mg (07/18/2017), 1,100 mg (08/01/2017), 1,100 mg (08/20/2017), 1,100 mg (09/03/2017) PACLitaxel (TAXOL) 120 mg in sodium chloride 0.9 % 250 mL chemo infusion (</= '80mg'$ /m2), 64 mg/m2 = 120 mg (80 % of original dose 80 mg/m2), Intravenous,  Once, 12 of 12 cycles  Dose modification: 64 mg/m2 (80 % of original dose 80 mg/m2, Cycle 5, Reason: Provider Judgment), 60 mg/m2 (75 % of original dose 80 mg/m2, Cycle 15, Reason: Other (see comments)), 40 mg/m2 (50 % of original dose 80 mg/m2, Cycle 16, Reason: Other (see comments)) Administration: 120 mg (09/17/2017), 144 mg (09/24/2017), 144 mg (10/03/2017), 144 mg (10/11/2017), 144 mg (10/18/2017), 144 mg (10/25/2017), 144 mg (11/01/2017), 144 mg (11/08/2017), 144 mg (11/15/2017), 144 mg (11/22/2017), 108 mg (11/29/2017), 72 mg (12/06/2017) fosaprepitant (EMEND) 150 mg, dexamethasone (DECADRON) 12 mg in sodium chloride 0.9 % 145 mL IVPB, , Intravenous,  Once, 4 of 4 cycles Administration:  (07/18/2017),  (08/01/2017),  (08/20/2017),  (09/03/2017)  for chemotherapy treatment.     02/07/2018 -  Chemotherapy    The patient had trastuzumab (HERCEPTIN) 600 mg in sodium chloride 0.9 % 250 mL chemo infusion, 567 mg, Intravenous,  Once, 10 of 18 cycles Administration: 600 mg (02/07/2018), 450 mg (02/27/2018), 450 mg (05/01/2018), 450 mg (05/22/2018), 450 mg (06/13/2018), 450 mg (07/03/2018), 450 mg (07/24/2018), 450 mg (03/20/2018), 450 mg (04/10/2018)  for chemotherapy treatment.       CANCER STAGING: Cancer Staging Breast cancer of upper-outer quadrant of left female breast [facility]) Staging form: Breast, AJCC 8th Edition - Clinical stage from 06/22/2017: Stage IIIB (cT3, cN1, cM0, G3, ER+, PR-, HER2-) - Signed by Derek Jack, MD on 06/22/2017    INTERVAL HISTORY:  Lisa Thornton 63 y.o. female returns for routine follow-up for breast cancer.  She has been doing well since her last visit.  She is getting her Herceptin every 3 weeks with no problems.  She reports she is more fatigued but she is unable to get out of the house much due to the COVID crisis.  She is taking her anastrozole daily as prescribed.  She denies any major side effects. Denies any nausea, vomiting, or diarrhea. Denies any new pains. Had not noticed any recent bleeding such as  epistaxis, hematuria or hematochezia. Denies recent chest pain on exertion, shortness of breath on minimal exertion, pre-syncopal episodes, or palpitations. Denies any numbness or tingling in hands or feet. Denies any recent fevers, infections, or recent hospitalizations. Patient reports appetite at 100% and energy level at 25%.  She is eating well and maintaining her weight at this time.    REVIEW OF SYSTEMS:  Review of Systems  Constitutional: Positive for fatigue.  All other systems reviewed and are negative.    PAST MEDICAL/SURGICAL HISTORY:  Past Medical History:  Diagnosis Date  . Cancer [facility])    left breast cancer  . Chest pain   . Hypertension    Past Surgical History:  Procedure Laterality Date  . MASTECTOMY MODIFIED RADICAL Left 12/31/2017   Procedure: LEFT MODIFIED RADICAL MASTECTOMY;  Surgeon: Aviva Signs, MD;  Location: [facility];  Service: General;  Laterality: Left;  . PORTACATH PLACEMENT Right 06/27/2017   Procedure: INSERTION PORT-A-CATH;  Surgeon: Aviva Signs, MD;  Location: [facility];  Service: General;  Laterality: Right;     SOCIAL HISTORY:  Social History   Socioeconomic History  . Marital status: Divorced    Spouse name: Not on file  . Number of children: Not on file  . Years of education: Not on file  . Highest education level: Not on file  Occupational History  . Not on file  Social Needs  . Financial resource strain: Somewhat hard  . Food insecurity:    Worry: Never true    Inability: Never true  . Transportation needs:    Medical: No    Non-medical: No  Tobacco Use  . Smoking status: Never Smoker  . Smokeless tobacco: Never Used  Substance and Sexual Activity  . Alcohol use: No  . Drug use: No  . Sexual activity: Not Currently  Lifestyle  . Physical activity:    Days per week: 3 days    Minutes per session: 10 min  . Stress: Very much  Relationships  . Social connections:    Talks on phone: More than three times a week    Gets  together: More than three times a week    Attends religious service: More than 4 times per year    Active member of club or organization: Yes    Attends meetings of clubs or organizations: Never    Relationship status: Divorced  . Intimate partner violence:    Fear of current or ex partner: Patient refused    Emotionally abused: Patient refused    Physically abused: Patient refused    Forced sexual activity: Patient refused  Other Topics Concern  . Not on file  Social History Narrative  . Not on file    FAMILY HISTORY:  Family History  Problem Relation Age of Onset  . Breast cancer Mother   . Thyroid disease Mother   . Heart disease Mother   . Heart attack Father   . Heart attack Sister   . Hypertension Sister   . Heart attack Brother   . Cancer Sister   . Stroke Brother   . Alzheimer's disease Maternal Aunt     CURRENT MEDICATIONS:  Outpatient Encounter  Medications as of 08/15/2018  Medication Sig  . amLODipine (NORVASC) 5 MG tablet Take 1 tablet (5 mg total) by mouth daily.  Marland Kitchen anastrozole (ARIMIDEX) 1 MG tablet Take 1 tablet (1 mg total) by mouth daily.  Marland Kitchen HYDROcodone-acetaminophen (NORCO) 5-325 MG tablet Take 1 tablet by mouth every 4 (four) hours as needed for moderate pain.  . metoprolol tartrate (LOPRESSOR) 50 MG tablet Take 50 mg by mouth 2 (two) times daily.  . ondansetron (ZOFRAN) 8 MG tablet Take 1 tablet (8 mg total) by mouth 2 (two) times daily as needed. Start on the third day after chemotherapy.  . prochlorperazine (COMPAZINE) 10 MG tablet Take 1 tablet (10 mg total) by mouth every 6 (six) hours as needed (Nausea or vomiting).  . Trastuzumab (HERCEPTIN IV) Inject into the vein.   No facility-administered encounter medications on file as of 08/15/2018.     ALLERGIES:  No Known Allergies   PHYSICAL EXAM:  ECOG Performance status: 1  Vitals:   08/15/18 0946  BP: 116/66  Pulse: (!) 58  Resp: 18  Temp: 97.9 F (36.6 C)  SpO2: 99%   Filed Weights    08/15/18 0946  Weight: 164 lb 9.6 oz (74.7 kg)    Physical Exam Constitutional:      Appearance: Normal appearance. She is normal weight.  Cardiovascular:     Rate and Rhythm: Normal rate and regular rhythm.     Heart sounds: Normal heart sounds.  Pulmonary:     Effort: Pulmonary effort is normal.     Breath sounds: Normal breath sounds.  Abdominal:     General: Bowel sounds are normal.     Palpations: Abdomen is soft.  Musculoskeletal: Normal range of motion.  Skin:    General: Skin is warm and dry.  Neurological:     Mental Status: She is alert and oriented to person, place, and time. Mental status is at baseline.  Psychiatric:        Mood and Affect: Mood normal.        Behavior: Behavior normal.        Thought Content: Thought content normal.        Judgment: Judgment normal.      LABORATORY DATA:  I have reviewed the labs as listed.  CBC    Component Value Date/Time   WBC 7.5 08/15/2018 0855   RBC 3.84 (L) 08/15/2018 0855   HGB 12.1 08/15/2018 0855   HCT 35.5 (L) 08/15/2018 0855   PLT 198 08/15/2018 0855   MCV 92.4 08/15/2018 0855   MCH 31.5 08/15/2018 0855   MCHC 34.1 08/15/2018 0855   RDW 12.2 08/15/2018 0855   LYMPHSABS 2.1 08/15/2018 0855   MONOABS 0.5 08/15/2018 0855   EOSABS 0.3 08/15/2018 0855   BASOSABS 0.0 08/15/2018 0855   CMP Latest Ref Rng & Units 08/15/2018 07/24/2018 07/03/2018  Glucose 70 - 99 mg/dL 185(H) 164(H) 148(H)  BUN 8 - 23 mg/dL '15 15 15  '$ Creatinine 0.44 - 1.00 mg/dL 0.95 0.87 0.90  Sodium 135 - 145 mmol/L 137 137 139  Potassium 3.5 - 5.1 mmol/L 3.8 3.8 4.3  Chloride 98 - 111 mmol/L 102 103 103  CO2 22 - 32 mmol/L '25 25 27  '$ Calcium 8.9 - 10.3 mg/dL 9.5 9.7 9.9  Total Protein 6.5 - 8.1 g/dL 7.5 7.4 7.2  Total Bilirubin 0.3 - 1.2 mg/dL 0.4 0.2(L) 0.2(L)  Alkaline Phos 38 - 126 U/L 50 51 56  AST 15 - 41 U/L 36 28 30  ALT 0 - 44 U/L 34 28 29    DIAGNOSTIC IMAGING:  I have independently reviewed the echocardiogram and discussed  with the patient.  I personally performed a face-to-face visit.  All questions were answered to patient's stated satisfaction. Encouraged patient to call with any new concerns or questions before his next visit to the cancer center and we can certain see him sooner, if needed.     ASSESSMENT & PLAN:   Breast cancer of upper-outer quadrant of left female breast ([place]) 1.  Poorly differentiated left breast cancer stage IIIb: - On 06/12/2017 she had a left breast biopsy with sarcomatoid features, left axillary lymph node biopsy was negative. - Her 2D echo showed EF 55 to 60% - CT of the chest showed subcarinal adenopathy. - PET CT scan was negative for metastatic disease. -She had 4 cycles of neoadjuvant dose dense AC from 07/18/2017 through 09/03/2017. - She had 12 cycles of weekly paclitaxel from 09/17/2017 through 12/06/2017. -She underwent a left modified radical mastectomy on 12/31/2017. - The pathology revealed a 2.3 cm poorly differentiated invasive ductal carcinoma with sarcomatoid changes, resection margins negative, high-grade DCIS, no skin involvement, 0 out of 8 lymph nodes positive, negative for lymphovascular or perineural invasion.  ER positive, PR and HER-2 negative.  Pathological staging is [id number]. - Her receptors were rechecked on a mastectomy specimen and HER-2 was positive by IHC.  This was initially negative by [place] on biopsy specimen.  ER was negative on mastectomy specimen.  It was 60% positive on biopsy specimen.  Ki 67 has decreased from 30% to 80% on biopsy.  This was reviewed by a second pathologist and confirmed. - It was recommended she have 1 year of Herceptin. - Echo dated 01/21/2018 showed EF 55 to 60%. -She started on Herceptin 02/07/2018.  She does report occasional diarrhea but no other side effects. - Anastrozole 1 mg started on 02/27/2018.  She is tolerating it very well. - Latest echo on 07/29/2018 showed her EF at 55 to 60%. -Her last mammogram dated 05/31/2018  showed B RADS 1. -Her physical exam today showed mastectomy site within normal limits.  Right breast has no palpable masses or adenopathy. -We will see her back in 6 weeks with repeat labs.  2.  Osteopenia: - Last DEXA scan on 03/07/2018 showed T score of -1.8. -She is taking calcium and vitamin D twice daily. -We will repeat DEXA scan in 2 years.       Orders placed this encounter:  Orders Placed This Encounter  Procedures  . Lactate dehydrogenase  . CBC with Differential/Platelet  . Comprehensive metabolic panel      Francene Finders, FNP-C [place] [phone]

## 2018-09-04 ENCOUNTER — Ambulatory Visit (HOSPITAL_COMMUNITY): Payer: Medicaid Other

## 2018-09-04 ENCOUNTER — Other Ambulatory Visit (HOSPITAL_COMMUNITY): Payer: Medicaid Other

## 2018-09-05 ENCOUNTER — Other Ambulatory Visit: Payer: Self-pay

## 2018-09-06 ENCOUNTER — Other Ambulatory Visit (HOSPITAL_COMMUNITY): Payer: Medicaid Other

## 2018-09-06 ENCOUNTER — Inpatient Hospital Stay (HOSPITAL_COMMUNITY): Payer: Medicaid Other | Attending: Hematology

## 2018-09-06 ENCOUNTER — Encounter (HOSPITAL_COMMUNITY): Payer: Self-pay

## 2018-09-06 VITALS — BP 135/53 | HR 55 | Temp 98.5°F | Resp 18 | Wt 168.0 lb

## 2018-09-06 DIAGNOSIS — Z170 Estrogen receptor positive status [ER+]: Secondary | ICD-10-CM | POA: Diagnosis not present

## 2018-09-06 DIAGNOSIS — Z9012 Acquired absence of left breast and nipple: Secondary | ICD-10-CM | POA: Diagnosis not present

## 2018-09-06 DIAGNOSIS — Z8349 Family history of other endocrine, nutritional and metabolic diseases: Secondary | ICD-10-CM | POA: Insufficient documentation

## 2018-09-06 DIAGNOSIS — Z8249 Family history of ischemic heart disease and other diseases of the circulatory system: Secondary | ICD-10-CM | POA: Diagnosis not present

## 2018-09-06 DIAGNOSIS — Z823 Family history of stroke: Secondary | ICD-10-CM | POA: Diagnosis not present

## 2018-09-06 DIAGNOSIS — M8580 Other specified disorders of bone density and structure, unspecified site: Secondary | ICD-10-CM | POA: Diagnosis not present

## 2018-09-06 DIAGNOSIS — C50412 Malignant neoplasm of upper-outer quadrant of left female breast: Secondary | ICD-10-CM

## 2018-09-06 DIAGNOSIS — Z79899 Other long term (current) drug therapy: Secondary | ICD-10-CM | POA: Diagnosis not present

## 2018-09-06 DIAGNOSIS — Z820 Family history of epilepsy and other diseases of the nervous system: Secondary | ICD-10-CM | POA: Insufficient documentation

## 2018-09-06 DIAGNOSIS — Z9221 Personal history of antineoplastic chemotherapy: Secondary | ICD-10-CM | POA: Insufficient documentation

## 2018-09-06 DIAGNOSIS — Z5112 Encounter for antineoplastic immunotherapy: Secondary | ICD-10-CM | POA: Diagnosis not present

## 2018-09-06 DIAGNOSIS — Z803 Family history of malignant neoplasm of breast: Secondary | ICD-10-CM | POA: Insufficient documentation

## 2018-09-06 DIAGNOSIS — R197 Diarrhea, unspecified: Secondary | ICD-10-CM | POA: Insufficient documentation

## 2018-09-06 MED ORDER — SODIUM CHLORIDE 0.9 % IV SOLN
Freq: Once | INTRAVENOUS | Status: AC
  Administered 2018-09-06: 09:00:00 via INTRAVENOUS

## 2018-09-06 MED ORDER — SODIUM CHLORIDE 0.9% FLUSH
10.0000 mL | INTRAVENOUS | Status: DC | PRN
  Administered 2018-09-06: 10 mL
  Filled 2018-09-06: qty 10

## 2018-09-06 MED ORDER — HEPARIN SOD (PORK) LOCK FLUSH 100 UNIT/ML IV SOLN
500.0000 [IU] | Freq: Once | INTRAVENOUS | Status: AC | PRN
  Administered 2018-09-06: 500 [IU]

## 2018-09-06 MED ORDER — DIPHENHYDRAMINE HCL 25 MG PO CAPS
50.0000 mg | ORAL_CAPSULE | Freq: Once | ORAL | Status: AC
  Administered 2018-09-06: 50 mg via ORAL
  Filled 2018-09-06: qty 2

## 2018-09-06 MED ORDER — ACETAMINOPHEN 325 MG PO TABS
650.0000 mg | ORAL_TABLET | Freq: Once | ORAL | Status: AC
  Administered 2018-09-06: 650 mg via ORAL
  Filled 2018-09-06: qty 2

## 2018-09-06 MED ORDER — TRASTUZUMAB CHEMO 150 MG IV SOLR
450.0000 mg | Freq: Once | INTRAVENOUS | Status: AC
  Administered 2018-09-06: 450 mg via INTRAVENOUS
  Filled 2018-09-06: qty 21.43

## 2018-09-06 NOTE — Patient Instructions (Signed)
[facility] Discharge Instructions for Patients Receiving Chemotherapy   Beginning January 23rd 2017 lab work for the Cancer Center will be done in the  Main lab at [place] on 1st floor. If you have a lab appointment with the Cancer Center please come in thru the  Main Entrance and check in at the main information desk   Today you received the following chemotherapy agents Herceptin. Follow-up as scheduled. Call clinic for any questions or concerns  To help prevent nausea and vomiting after your treatment, we encourage you to take your nausea medication.   If you develop nausea and vomiting, or diarrhea that is not controlled by your medication, call the clinic.  The clinic phone number is [phone]. Office hours are Monday-Friday 8:30am-5:00pm.  BELOW ARE SYMPTOMS THAT SHOULD BE REPORTED IMMEDIATELY:  *FEVER GREATER THAN 101.0 F  *CHILLS WITH OR WITHOUT FEVER  NAUSEA AND VOMITING THAT IS NOT CONTROLLED WITH YOUR NAUSEA MEDICATION  *UNUSUAL SHORTNESS OF BREATH  *UNUSUAL BRUISING OR BLEEDING  TENDERNESS IN MOUTH AND THROAT WITH OR WITHOUT PRESENCE OF ULCERS  *URINARY PROBLEMS  *BOWEL PROBLEMS  UNUSUAL RASH Items with * indicate a potential emergency and should be followed up as soon as possible. If you have an emergency after office hours please contact your primary care physician or go to the nearest emergency department.  Please call the clinic during office hours if you have any questions or concerns.   You may also contact the Patient Navigator at [phone] should you have any questions or need assistance in obtaining follow up care.      Resources For Cancer Patients and their Caregivers ? American Cancer Society: Can assist with transportation, wigs, general needs, runs Look Good Feel Better.        1-888-227-6333 ? Cancer Care: Provides financial assistance, online support groups, medication/co-pay assistance.  1-800-813-HOPE  (4673) ? [facility] Assists [place] cancer patients and their families through emotional , educational and financial support.  336-427-4357 ? [place] DSS Where to apply for food stamps, Medicaid and utility assistance. 336-342-1394 ? [facility]: Transportation to medical appointments. 336-347-2287 ? Social Security Administration: May apply for disability if have a Stage IV cancer. 336-342-7796 1-800-772-1213 ? [place] [facility]ging, Disability and Transit Services: Assists with nutrition, care and transit needs. 336-349-2343         

## 2018-09-06 NOTE — Progress Notes (Signed)
Lisa Thornton tolerated Herceptin infusion well without complaints or incident. VSS upon discharge. Pt discharged self ambulatory in satisfactory condition

## 2018-09-20 ENCOUNTER — Other Ambulatory Visit (HOSPITAL_COMMUNITY): Payer: Self-pay | Admitting: Hematology

## 2018-09-20 DIAGNOSIS — Z170 Estrogen receptor positive status [ER+]: Secondary | ICD-10-CM

## 2018-09-20 DIAGNOSIS — C50412 Malignant neoplasm of upper-outer quadrant of left female breast: Secondary | ICD-10-CM

## 2018-09-23 ENCOUNTER — Other Ambulatory Visit (HOSPITAL_COMMUNITY): Payer: Self-pay | Admitting: *Deleted

## 2018-09-25 ENCOUNTER — Inpatient Hospital Stay (HOSPITAL_BASED_OUTPATIENT_CLINIC_OR_DEPARTMENT_OTHER): Payer: Medicaid Other | Admitting: Hematology

## 2018-09-25 ENCOUNTER — Inpatient Hospital Stay (HOSPITAL_COMMUNITY): Payer: Medicaid Other

## 2018-09-25 ENCOUNTER — Encounter (HOSPITAL_COMMUNITY): Payer: Self-pay | Admitting: Hematology

## 2018-09-25 ENCOUNTER — Other Ambulatory Visit: Payer: Self-pay

## 2018-09-25 VITALS — BP 138/87 | HR 70 | Temp 98.9°F | Resp 18 | Wt 167.0 lb

## 2018-09-25 VITALS — BP 153/66 | HR 54 | Temp 98.0°F | Resp 18

## 2018-09-25 DIAGNOSIS — Z8349 Family history of other endocrine, nutritional and metabolic diseases: Secondary | ICD-10-CM

## 2018-09-25 DIAGNOSIS — C50412 Malignant neoplasm of upper-outer quadrant of left female breast: Secondary | ICD-10-CM

## 2018-09-25 DIAGNOSIS — Z9221 Personal history of antineoplastic chemotherapy: Secondary | ICD-10-CM

## 2018-09-25 DIAGNOSIS — Z803 Family history of malignant neoplasm of breast: Secondary | ICD-10-CM

## 2018-09-25 DIAGNOSIS — Z823 Family history of stroke: Secondary | ICD-10-CM

## 2018-09-25 DIAGNOSIS — Z8249 Family history of ischemic heart disease and other diseases of the circulatory system: Secondary | ICD-10-CM

## 2018-09-25 DIAGNOSIS — Z820 Family history of epilepsy and other diseases of the nervous system: Secondary | ICD-10-CM

## 2018-09-25 DIAGNOSIS — M8580 Other specified disorders of bone density and structure, unspecified site: Secondary | ICD-10-CM | POA: Diagnosis not present

## 2018-09-25 DIAGNOSIS — R197 Diarrhea, unspecified: Secondary | ICD-10-CM

## 2018-09-25 DIAGNOSIS — Z9012 Acquired absence of left breast and nipple: Secondary | ICD-10-CM

## 2018-09-25 DIAGNOSIS — Z170 Estrogen receptor positive status [ER+]: Secondary | ICD-10-CM

## 2018-09-25 DIAGNOSIS — Z5112 Encounter for antineoplastic immunotherapy: Secondary | ICD-10-CM | POA: Diagnosis not present

## 2018-09-25 DIAGNOSIS — Z79899 Other long term (current) drug therapy: Secondary | ICD-10-CM

## 2018-09-25 LAB — CBC WITH DIFFERENTIAL/PLATELET
Abs Immature Granulocytes: 0.02 10*3/uL (ref 0.00–0.07)
Basophils Absolute: 0 10*3/uL (ref 0.0–0.1)
Basophils Relative: 1 %
Eosinophils Absolute: 0.2 10*3/uL (ref 0.0–0.5)
Eosinophils Relative: 3 %
HCT: 34.5 % — ABNORMAL LOW (ref 36.0–46.0)
Hemoglobin: 11.8 g/dL — ABNORMAL LOW (ref 12.0–15.0)
Immature Granulocytes: 0 %
Lymphocytes Relative: 32 %
Lymphs Abs: 2.7 10*3/uL (ref 0.7–4.0)
MCH: 31.6 pg (ref 26.0–34.0)
MCHC: 34.2 g/dL (ref 30.0–36.0)
MCV: 92.5 fL (ref 80.0–100.0)
Monocytes Absolute: 0.6 10*3/uL (ref 0.1–1.0)
Monocytes Relative: 8 %
Neutro Abs: 4.7 10*3/uL (ref 1.7–7.7)
Neutrophils Relative %: 56 %
Platelets: 194 10*3/uL (ref 150–400)
RBC: 3.73 MIL/uL — ABNORMAL LOW (ref 3.87–5.11)
RDW: 11.9 % (ref 11.5–15.5)
WBC: 8.3 10*3/uL (ref 4.0–10.5)
nRBC: 0 % (ref 0.0–0.2)

## 2018-09-25 LAB — COMPREHENSIVE METABOLIC PANEL
ALT: 37 U/L (ref 0–44)
AST: 40 U/L (ref 15–41)
Albumin: 3.8 g/dL (ref 3.5–5.0)
Alkaline Phosphatase: 56 U/L (ref 38–126)
Anion gap: 11 (ref 5–15)
BUN: 18 mg/dL (ref 8–23)
CO2: 26 mmol/L (ref 22–32)
Calcium: 10 mg/dL (ref 8.9–10.3)
Chloride: 100 mmol/L (ref 98–111)
Creatinine, Ser: 0.89 mg/dL (ref 0.44–1.00)
GFR calc Af Amer: >60 mL/min (ref >60)
GFR calc non Af Amer: >60 mL/min (ref >60)
Glucose, Bld: 161 mg/dL — ABNORMAL HIGH (ref 70–99)
Potassium: 4.2 mmol/L (ref 3.5–5.1)
Sodium: 137 mmol/L (ref 135–145)
Total Bilirubin: 0.3 mg/dL (ref 0.3–1.2)
Total Protein: 7.5 g/dL (ref 6.5–8.1)

## 2018-09-25 LAB — LACTATE DEHYDROGENASE: LDH: 131 U/L (ref 98–192)

## 2018-09-25 MED ORDER — SODIUM CHLORIDE 0.9 % IV SOLN
Freq: Once | INTRAVENOUS | Status: AC
  Administered 2018-09-25: 11:00:00 via INTRAVENOUS

## 2018-09-25 MED ORDER — HEPARIN SOD (PORK) LOCK FLUSH 100 UNIT/ML IV SOLN
500.0000 [IU] | Freq: Once | INTRAVENOUS | Status: AC | PRN
  Administered 2018-09-25: 500 [IU]

## 2018-09-25 MED ORDER — TRASTUZUMAB CHEMO 150 MG IV SOLR
450.0000 mg | Freq: Once | INTRAVENOUS | Status: AC
  Administered 2018-09-25: 450 mg via INTRAVENOUS
  Filled 2018-09-25: qty 21.43

## 2018-09-25 MED ORDER — ACETAMINOPHEN 325 MG PO TABS
ORAL_TABLET | ORAL | Status: AC
  Filled 2018-09-25: qty 2

## 2018-09-25 MED ORDER — DIPHENHYDRAMINE HCL 25 MG PO CAPS
50.0000 mg | ORAL_CAPSULE | Freq: Once | ORAL | Status: AC
  Administered 2018-09-25: 50 mg via ORAL

## 2018-09-25 MED ORDER — DIPHENHYDRAMINE HCL 25 MG PO CAPS
ORAL_CAPSULE | ORAL | Status: AC
  Filled 2018-09-25: qty 2

## 2018-09-25 MED ORDER — ACETAMINOPHEN 325 MG PO TABS
650.0000 mg | ORAL_TABLET | Freq: Once | ORAL | Status: AC
  Administered 2018-09-25: 650 mg via ORAL

## 2018-09-25 MED ORDER — SODIUM CHLORIDE 0.9% FLUSH
10.0000 mL | INTRAVENOUS | Status: DC | PRN
  Administered 2018-09-25: 11:00:00 10 mL
  Filled 2018-09-25: qty 10

## 2018-09-25 NOTE — Progress Notes (Signed)
[place], [place] [address]   CLINIC:  Medical Oncology/Hematology  PCP:  [facility] [address] [place] [address] [phone]   REASON FOR VISIT:  Follow-up for Breast Cancer   CURRENT THERAPY: Herceptin   BRIEF ONCOLOGIC HISTORY:  Oncology History  Breast cancer of upper-outer quadrant of left female breast ([place])  06/22/2017 Initial Diagnosis   Breast cancer of upper-outer quadrant of left female breast ([place])   06/22/2017 Cancer Staging   Staging form: Breast, AJCC 8th Edition - Clinical stage from 06/22/2017: Stage IIIB (cT3, cN1, cM0, G3, ER+, PR-, HER2-) - Signed by Derek Jack, MD on 06/22/2017   06/29/2017 Imaging   CT chest showing left axillary adenopathy, 1.2 cm anterior carinal adenopathy, left subpectoral lymph node subcentimeter  06/25/2017 2D echocardiogram with ejection fraction of 55-60%   07/16/2017 - 12/12/2017 Chemotherapy   The patient had DOXOrubicin (ADRIAMYCIN) chemo injection 110 mg, 60 mg/m2 = 110 mg, Intravenous,  Once, 4 of 4 cycles Administration: 110 mg (07/18/2017), 110 mg (08/01/2017), 110 mg (08/20/2017), 110 mg (09/03/2017) palonosetron (ALOXI) injection 0.25 mg, 0.25 mg, Intravenous,  Once, 4 of 4 cycles Administration: 0.25 mg (07/18/2017), 0.25 mg (08/01/2017), 0.25 mg (08/20/2017), 0.25 mg (09/03/2017) pegfilgrastim-cbqv (UDENYCA) injection 6 mg, 6 mg, Subcutaneous, Once, 4 of 4 cycles Administration: 6 mg (07/19/2017), 6 mg (08/02/2017), 6 mg (08/22/2017), 6 mg (09/05/2017) cyclophosphamide (CYTOXAN) 1,100 mg in sodium chloride 0.9 % 250 mL chemo infusion, 600 mg/m2 = 1,100 mg, Intravenous,  Once, 4 of 4 cycles Administration: 1,100 mg (07/18/2017), 1,100 mg (08/01/2017), 1,100 mg (08/20/2017), 1,100 mg (09/03/2017) PACLitaxel (TAXOL) 120 mg in sodium chloride 0.9 % 250 mL chemo infusion (</= 12m/m2), 64 mg/m2 = 120 mg (80 % of original dose 80 mg/m2), Intravenous,  Once, 12 of 12 cycles Dose  modification: 64 mg/m2 (80 % of original dose 80 mg/m2, Cycle 5, Reason: Provider Judgment), 60 mg/m2 (75 % of original dose 80 mg/m2, Cycle 15, Reason: Other (see comments)), 40 mg/m2 (50 % of original dose 80 mg/m2, Cycle 16, Reason: Other (see comments)) Administration: 120 mg (09/17/2017), 144 mg (09/24/2017), 144 mg (10/03/2017), 144 mg (10/11/2017), 144 mg (10/18/2017), 144 mg (10/25/2017), 144 mg (11/01/2017), 144 mg (11/08/2017), 144 mg (11/15/2017), 144 mg (11/22/2017), 108 mg (11/29/2017), 72 mg (12/06/2017) fosaprepitant (EMEND) 150 mg, dexamethasone (DECADRON) 12 mg in sodium chloride 0.9 % 145 mL IVPB, , Intravenous,  Once, 4 of 4 cycles Administration:  (07/18/2017),  (08/01/2017),  (08/20/2017),  (09/03/2017)  for chemotherapy treatment.    02/07/2018 -  Chemotherapy   The patient had trastuzumab (HERCEPTIN) 600 mg in sodium chloride 0.9 % 250 mL chemo infusion, 567 mg, Intravenous,  Once, 12 of 18 cycles Administration: 600 mg (02/07/2018), 450 mg (02/27/2018), 450 mg (05/01/2018), 450 mg (05/22/2018), 450 mg (06/13/2018), 450 mg (07/03/2018), 450 mg (07/24/2018), 450 mg (08/15/2018), 450 mg (09/06/2018), 450 mg (03/20/2018), 450 mg (09/25/2018), 450 mg (04/10/2018)  for chemotherapy treatment.       CANCER STAGING: Cancer Staging Breast cancer of upper-outer quadrant of left female breast ([facility] Staging form: Breast, AJCC 8th Edition - Clinical stage from 06/22/2017: Stage IIIB (cT3, cN1, cM0, G3, ER+, PR-, HER2-) - Signed by KDerek Jack MD on 06/22/2017    INTERVAL HISTORY:  Ms. GMalicki89y.[email or web address]. female presents today for follow up. Reports overall doing well. Denies any significant fatigue. She is currently on Herceptin, tolerating well. Denies any changes in her breast. No masses, lumps,  nipple discharge or inversion. Appetites is stable. Energy level is adequate. Denies any fevers, chills, night sweats.  Denies any symptoms of PND or orthopnea.  No ER visits or hospitalizations noted.  She is tolerating  anastrozole without any problems.  No major hot flashes or musculoskeletal pains.  Appetite is 100%.  Energy levels are 25%.  Pain is rated as 0.   REVIEW OF SYSTEMS:  Review of Systems  All other systems reviewed and are negative.    PAST MEDICAL/SURGICAL HISTORY:  Past Medical History:  Diagnosis Date  . Cancer [facility]    left breast cancer  . Chest pain   . Hypertension    Past Surgical History:  Procedure Laterality Date  . MASTECTOMY MODIFIED RADICAL Left 12/31/2017   Procedure: LEFT MODIFIED RADICAL MASTECTOMY;  Surgeon: Aviva Signs, MD;  Location: [facility];  Service: General;  Laterality: Left;  . PORTACATH PLACEMENT Right 06/27/2017   Procedure: INSERTION PORT-A-CATH;  Surgeon: Aviva Signs, MD;  Location: [facility];  Service: General;  Laterality: Right;     SOCIAL HISTORY:  Social History   Socioeconomic History  . Marital status: Divorced    Spouse name: Not on file  . Number of children: Not on file  . Years of education: Not on file  . Highest education level: Not on file  Occupational History  . Not on file  Social Needs  . Financial resource strain: Somewhat hard  . Food insecurity    Worry: Never true    Inability: Never true  . Transportation needs    Medical: No    Non-medical: No  Tobacco Use  . Smoking status: Never Smoker  . Smokeless tobacco: Never Used  Substance and Sexual Activity  . Alcohol use: No  . Drug use: No  . Sexual activity: Not Currently  Lifestyle  . Physical activity    Days per week: 3 days    Minutes per session: 10 min  . Stress: Very much  Relationships  . Social connections    Talks on phone: More than three times a week    Gets together: More than three times a week    Attends religious service: More than 4 times per year    Active member of club or organization: Yes    Attends meetings of clubs or organizations: Never    Relationship status: Divorced  . Intimate partner violence    Fear of current or ex partner:  Patient refused    Emotionally abused: Patient refused    Physically abused: Patient refused    Forced sexual activity: Patient refused  Other Topics Concern  . Not on file  Social History Narrative  . Not on file    FAMILY HISTORY:  Family History  Problem Relation Age of Onset  . Breast cancer Mother   . Thyroid disease Mother   . Heart disease Mother   . Heart attack Father   . Heart attack Sister   . Hypertension Sister   . Heart attack Brother   . Cancer Sister   . Stroke Brother   . Alzheimer's disease Maternal Aunt     CURRENT MEDICATIONS:  Outpatient Encounter Medications as of 09/25/2018  Medication Sig  . amLODipine (NORVASC) 5 MG tablet Take 1 tablet (5 mg total) by mouth daily.  Marland Kitchen anastrozole (ARIMIDEX) 1 MG tablet Take 1 tablet by mouth once daily  . HYDROcodone-acetaminophen (NORCO) 5-325 MG tablet Take 1 tablet by mouth every 4 (four) hours as needed for moderate pain.  Marland Kitchen  metoprolol tartrate (LOPRESSOR) 50 MG tablet Take 50 mg by mouth 2 (two) times daily.  . ondansetron (ZOFRAN) 8 MG tablet Take 1 tablet (8 mg total) by mouth 2 (two) times daily as needed. Start on the third day after chemotherapy.  . prochlorperazine (COMPAZINE) 10 MG tablet Take 1 tablet (10 mg total) by mouth every 6 (six) hours as needed (Nausea or vomiting).  . Trastuzumab (HERCEPTIN IV) Inject into the vein.   No facility-administered encounter medications on file as of 09/25/2018.     ALLERGIES:  No Known Allergies   PHYSICAL EXAM:  ECOG Performance status: 1  Vitals:   09/25/18 0951  BP: 138/87  Pulse: 70  Resp: 18  Temp: 98.9 F (37.2 C)  SpO2: 96%   Filed Weights   09/25/18 0951  Weight: 167 lb (75.8 kg)    Physical Exam Constitutional:      Appearance: Normal appearance. She is obese.  HENT:     Head: Normocephalic.     Nose: Nose normal.     Mouth/Throat:     Mouth: Mucous membranes are moist.     Pharynx: Oropharynx is clear.  Eyes:     Extraocular  Movements: Extraocular movements intact.     Conjunctiva/sclera: Conjunctivae normal.  Neck:     Musculoskeletal: Normal range of motion.  Cardiovascular:     Rate and Rhythm: Normal rate and regular rhythm.     Pulses: Normal pulses.     Heart sounds: Normal heart sounds.  Pulmonary:     Effort: Pulmonary effort is normal.     Breath sounds: Normal breath sounds.  Abdominal:     General: Bowel sounds are normal.     Palpations: Abdomen is soft.  Musculoskeletal: Normal range of motion.  Skin:    General: Skin is warm.  Neurological:     General: No focal deficit present.     Mental Status: She is alert and oriented to person, place, and time.  Psychiatric:        Mood and Affect: Mood normal.        Behavior: Behavior normal.        Thought Content: Thought content normal.        Judgment: Judgment normal.   Right breast has no palpable masses.  Left mastectomy site is within normal limits.  No palpable lymphadenopathy.   LABORATORY DATA:  I have reviewed the labs as listed.  CBC    Component Value Date/Time   WBC 8.3 09/25/2018 1018   RBC 3.73 (L) 09/25/2018 1018   HGB 11.8 (L) 09/25/2018 1018   HCT 34.5 (L) 09/25/2018 1018   PLT 194 09/25/2018 1018   MCV 92.5 09/25/2018 1018   MCH 31.6 09/25/2018 1018   MCHC 34.2 09/25/2018 1018   RDW 11.9 09/25/2018 1018   LYMPHSABS 2.7 09/25/2018 1018   MONOABS 0.6 09/25/2018 1018   EOSABS 0.2 09/25/2018 1018   BASOSABS 0.0 09/25/2018 1018   CMP Latest Ref Rng & Units 09/25/2018 08/15/2018 07/24/2018  Glucose 70 - 99 mg/dL 161(H) 185(H) 164(H)  BUN 8 - 23 mg/dL 18 15 15  Creatinine 0.44 - 1.00 mg/dL 0.89 0.95 0.87  Sodium 135 - 145 mmol/L 137 137 137  Potassium 3.5 - 5.1 mmol/L 4.2 3.8 3.8  Chloride 98 - 111 mmol/L 100 102 103  CO2 22 - 32 mmol/L 26 25 25  Calcium 8.9 - 10.3 mg/dL 10.0 9.5 9.7  Total Protein 6.5 - 8.1 g/dL 7.5 7.5 7.4  Total   Bilirubin 0.3 - 1.2 mg/dL 0.3 0.4 0.2(L)  Alkaline Phos 38 - 126 U/L 56 50 51   AST 15 - 41 U/L 40 36 28  ALT 0 - 44 U/L 37 34 28     I have independently reviewed her right breast mammogram dated 05/31/2018.   ASSESSMENT & PLAN:   Breast cancer of upper-outer quadrant of left female breast ([place]) 1.  Poorly differentiated left breast cancer stage IIIb: - On 06/12/2017 she had a left breast biopsy with sarcomatoid features, left axillary lymph node biopsy was negative. - Her 2D echo showed EF 55 to 60% - CT of the chest showed subcarinal adenopathy. - PET CT scan was negative for metastatic disease. -She had 4 cycles of neoadjuvant dose dense AC from 07/18/2017 through 09/03/2017. - She had 12 cycles of weekly paclitaxel from 09/17/2017 through 12/06/2017. -She underwent a left modified radical mastectomy on 12/31/2017. - The pathology revealed a 2.3 cm poorly differentiated invasive ductal carcinoma with sarcomatoid changes, resection margins negative, high-grade DCIS, no skin involvement, 0 out of 8 lymph nodes positive, negative for lymphovascular or perineural invasion.  ER positive, PR and HER-2 negative.  Pathological staging is YpT2YpN0. - Her receptors were rechecked on a mastectomy specimen and HER-2 was positive by IHC.  This was initially negative by [place] on biopsy specimen.  ER was negative on mastectomy specimen.  It was 60% positive on biopsy specimen.  Ki 67 has decreased from 30% to 80% on biopsy.  This was reviewed by a second pathologist and confirmed. - It was recommended she have 1 year of Herceptin. - Echo dated 01/21/2018 showed EF 55 to 60%. -She started on Herceptin 02/07/2018.  She does report occasional diarrhea but no other side effects. - Anastrozole 1 mg started on 02/27/2018.  She is tolerating it very well. - Latest echo on 07/29/2018 showed her EF at 55 to 60%. -Her last mammogram dated 05/31/2018 showed B RADS 1. - Labs are aceeptable to proceed with Herceptin today.  - Plan to repeat ECHO in July.  - RTC in 6 weeks.   2.  Osteopenia: - Last  DEXA scan on 03/07/2018 showed T score of -1.8. -She is taking calcium and vitamin D twice daily. -We will repeat DEXA scan in 2 years.    Total time spent is 25 minutes with more than 50% of the time spent face-to-face discussing treatment plan and counseling and coordination of care.  Orders placed this encounter:  Orders Placed This Encounter  Procedures  . ECHOCARDIOGRAM COMPLETE      Derek Jack, [place] [phone]

## 2018-09-25 NOTE — Assessment & Plan Note (Signed)
1.  Poorly differentiated left breast cancer stage IIIb: - On 06/12/2017 she had a left breast biopsy with sarcomatoid features, left axillary lymph node biopsy was negative. - Her 2D echo showed EF 55 to 60% - CT of the chest showed subcarinal adenopathy. - PET CT scan was negative for metastatic disease. -She had 4 cycles of neoadjuvant dose dense AC from 07/18/2017 through 09/03/2017. - She had 12 cycles of weekly paclitaxel from 09/17/2017 through 12/06/2017. -She underwent a left modified radical mastectomy on 12/31/2017. - The pathology revealed a 2.3 cm poorly differentiated invasive ductal carcinoma with sarcomatoid changes, resection margins negative, high-grade DCIS, no skin involvement, 0 out of 8 lymph nodes positive, negative for lymphovascular or perineural invasion.  ER positive, PR and HER-2 negative.  Pathological staging is YpT2YpN0. - Her receptors were rechecked on a mastectomy specimen and HER-2 was positive by IHC.  This was initially negative by [place] on biopsy specimen.  ER was negative on mastectomy specimen.  It was 60% positive on biopsy specimen.  Ki 67 has decreased from 30% to 80% on biopsy.  This was reviewed by a second pathologist and confirmed. - It was recommended she have 1 year of Herceptin. - Echo dated 01/21/2018 showed EF 55 to 60%. -She started on Herceptin 02/07/2018.  She does report occasional diarrhea but no other side effects. - Anastrozole 1 mg started on 02/27/2018.  She is tolerating it very well. - Latest echo on 07/29/2018 showed her EF at 55 to 60%. -Her last mammogram dated 05/31/2018 showed B RADS 1. - Labs are aceeptable to proceed with Herceptin today.  - Plan to repeat ECHO in July.  - RTC in 6 weeks.   2.  Osteopenia: - Last DEXA scan on 03/07/2018 showed T score of -1.8. -She is taking calcium and vitamin D twice daily. -We will repeat DEXA scan in 2 years.

## 2018-09-25 NOTE — Progress Notes (Signed)
Proceed with treatment today per MD. Will draw labs per MD.   Treatment given per orders. Patient tolerated it well without problems. Vitals stable and discharged home from clinic ambulatory. Follow up as scheduled.

## 2018-10-14 ENCOUNTER — Other Ambulatory Visit: Payer: Self-pay

## 2018-10-14 ENCOUNTER — Ambulatory Visit (HOSPITAL_COMMUNITY)
Admission: RE | Admit: 2018-10-14 | Discharge: 2018-10-14 | Disposition: A | Discharge status: Home / Self Care | Payer: Medicaid Other | Source: Ambulatory Visit | Attending: Hematology | Admitting: Hematology

## 2018-10-14 DIAGNOSIS — C50412 Malignant neoplasm of upper-outer quadrant of left female breast: Secondary | ICD-10-CM | POA: Diagnosis present

## 2018-10-14 DIAGNOSIS — Z170 Estrogen receptor positive status [ER+]: Secondary | ICD-10-CM | POA: Insufficient documentation

## 2018-10-14 NOTE — Progress Notes (Signed)
*  PRELIMINARY RESULTS* Echocardiogram 2D Echocardiogram has been performed.  [name] 10/14/2018, 12:06 PM

## 2018-10-16 ENCOUNTER — Inpatient Hospital Stay (HOSPITAL_COMMUNITY): Payer: Medicaid Other | Attending: Hematology

## 2018-10-16 ENCOUNTER — Other Ambulatory Visit: Payer: Self-pay

## 2018-10-16 ENCOUNTER — Inpatient Hospital Stay (HOSPITAL_COMMUNITY): Payer: Medicaid Other

## 2018-10-16 VITALS — BP 129/70 | HR 52 | Temp 97.7°F | Resp 18

## 2018-10-16 DIAGNOSIS — C50412 Malignant neoplasm of upper-outer quadrant of left female breast: Secondary | ICD-10-CM | POA: Insufficient documentation

## 2018-10-16 DIAGNOSIS — Z8349 Family history of other endocrine, nutritional and metabolic diseases: Secondary | ICD-10-CM | POA: Insufficient documentation

## 2018-10-16 DIAGNOSIS — Z9221 Personal history of antineoplastic chemotherapy: Secondary | ICD-10-CM | POA: Insufficient documentation

## 2018-10-16 DIAGNOSIS — Z823 Family history of stroke: Secondary | ICD-10-CM | POA: Diagnosis not present

## 2018-10-16 DIAGNOSIS — Z820 Family history of epilepsy and other diseases of the nervous system: Secondary | ICD-10-CM | POA: Diagnosis not present

## 2018-10-16 DIAGNOSIS — Z79899 Other long term (current) drug therapy: Secondary | ICD-10-CM | POA: Insufficient documentation

## 2018-10-16 DIAGNOSIS — Z809 Family history of malignant neoplasm, unspecified: Secondary | ICD-10-CM | POA: Diagnosis not present

## 2018-10-16 DIAGNOSIS — M8580 Other specified disorders of bone density and structure, unspecified site: Secondary | ICD-10-CM | POA: Insufficient documentation

## 2018-10-16 DIAGNOSIS — Z170 Estrogen receptor positive status [ER+]: Secondary | ICD-10-CM

## 2018-10-16 DIAGNOSIS — Z5112 Encounter for antineoplastic immunotherapy: Secondary | ICD-10-CM | POA: Diagnosis present

## 2018-10-16 DIAGNOSIS — Z8249 Family history of ischemic heart disease and other diseases of the circulatory system: Secondary | ICD-10-CM | POA: Insufficient documentation

## 2018-10-16 DIAGNOSIS — I10 Essential (primary) hypertension: Secondary | ICD-10-CM | POA: Diagnosis not present

## 2018-10-16 DIAGNOSIS — Z803 Family history of malignant neoplasm of breast: Secondary | ICD-10-CM | POA: Insufficient documentation

## 2018-10-16 LAB — COMPREHENSIVE METABOLIC PANEL
ALT: 37 U/L (ref 0–44)
AST: 38 U/L (ref 15–41)
Albumin: 3.7 g/dL (ref 3.5–5.0)
Alkaline Phosphatase: 53 U/L (ref 38–126)
Anion gap: 11 (ref 5–15)
BUN: 14 mg/dL (ref 8–23)
CO2: 26 mmol/L (ref 22–32)
Calcium: 10.2 mg/dL (ref 8.9–10.3)
Chloride: 100 mmol/L (ref 98–111)
Creatinine, Ser: 0.98 mg/dL (ref 0.44–1.00)
GFR calc Af Amer: >60 mL/min (ref >60)
GFR calc non Af Amer: >60 mL/min (ref >60)
Glucose, Bld: 164 mg/dL — ABNORMAL HIGH (ref 70–99)
Potassium: 3.8 mmol/L (ref 3.5–5.1)
Sodium: 137 mmol/L (ref 135–145)
Total Bilirubin: 0.5 mg/dL (ref 0.3–1.2)
Total Protein: 7.6 g/dL (ref 6.5–8.1)

## 2018-10-16 LAB — CBC WITH DIFFERENTIAL/PLATELET
Abs Immature Granulocytes: 0.02 10*3/uL (ref 0.00–0.07)
Basophils Absolute: 0 10*3/uL (ref 0.0–0.1)
Basophils Relative: 0 %
Eosinophils Absolute: 0.3 10*3/uL (ref 0.0–0.5)
Eosinophils Relative: 4 %
HCT: 35.8 % — ABNORMAL LOW (ref 36.0–46.0)
Hemoglobin: 12.2 g/dL (ref 12.0–15.0)
Immature Granulocytes: 0 %
Lymphocytes Relative: 35 %
Lymphs Abs: 2.4 10*3/uL (ref 0.7–4.0)
MCH: 31.2 pg (ref 26.0–34.0)
MCHC: 34.1 g/dL (ref 30.0–36.0)
MCV: 91.6 fL (ref 80.0–100.0)
Monocytes Absolute: 0.4 10*3/uL (ref 0.1–1.0)
Monocytes Relative: 6 %
Neutro Abs: 3.7 10*3/uL (ref 1.7–7.7)
Neutrophils Relative %: 55 %
Platelets: 195 10*3/uL (ref 150–400)
RBC: 3.91 MIL/uL (ref 3.87–5.11)
RDW: 11.9 % (ref 11.5–15.5)
WBC: 6.8 10*3/uL (ref 4.0–10.5)
nRBC: 0 % (ref 0.0–0.2)

## 2018-10-16 MED ORDER — DIPHENHYDRAMINE HCL 25 MG PO CAPS
50.0000 mg | ORAL_CAPSULE | Freq: Once | ORAL | Status: AC
  Administered 2018-10-16: 50 mg via ORAL

## 2018-10-16 MED ORDER — ACETAMINOPHEN 325 MG PO TABS
650.0000 mg | ORAL_TABLET | Freq: Once | ORAL | Status: AC
  Administered 2018-10-16: 650 mg via ORAL

## 2018-10-16 MED ORDER — SODIUM CHLORIDE 0.9% FLUSH
10.0000 mL | INTRAVENOUS | Status: DC | PRN
  Administered 2018-10-16: 10 mL
  Filled 2018-10-16: qty 10

## 2018-10-16 MED ORDER — DIPHENHYDRAMINE HCL 25 MG PO CAPS
ORAL_CAPSULE | ORAL | Status: AC
  Filled 2018-10-16: qty 2

## 2018-10-16 MED ORDER — HEPARIN SOD (PORK) LOCK FLUSH 100 UNIT/ML IV SOLN
500.0000 [IU] | Freq: Once | INTRAVENOUS | Status: AC | PRN
  Administered 2018-10-16: 500 [IU]

## 2018-10-16 MED ORDER — SODIUM CHLORIDE 0.9 % IV SOLN
Freq: Once | INTRAVENOUS | Status: AC
  Administered 2018-10-16: 10:00:00 via INTRAVENOUS

## 2018-10-16 MED ORDER — TRASTUZUMAB CHEMO 150 MG IV SOLR
450.0000 mg | Freq: Once | INTRAVENOUS | Status: AC
  Administered 2018-10-16: 450 mg via INTRAVENOUS
  Filled 2018-10-16: qty 21.43

## 2018-10-16 MED ORDER — ACETAMINOPHEN 325 MG PO TABS
ORAL_TABLET | ORAL | Status: AC
  Filled 2018-10-16: qty 2

## 2018-10-16 NOTE — Patient Instructions (Signed)
[facility] Discharge Instructions for Patients Receiving Chemotherapy  Today you received the following chemotherapy agents   To help prevent nausea and vomiting after your treatment, we encourage you to take your nausea medication   If you develop nausea and vomiting that is not controlled by your nausea medication, call the clinic.   BELOW ARE SYMPTOMS THAT SHOULD BE REPORTED IMMEDIATELY:  *FEVER GREATER THAN 100.5 F  *CHILLS WITH OR WITHOUT FEVER  NAUSEA AND VOMITING THAT IS NOT CONTROLLED WITH YOUR NAUSEA MEDICATION  *UNUSUAL SHORTNESS OF BREATH  *UNUSUAL BRUISING OR BLEEDING  TENDERNESS IN MOUTH AND THROAT WITH OR WITHOUT PRESENCE OF ULCERS  *URINARY PROBLEMS  *BOWEL PROBLEMS  UNUSUAL RASH Items with * indicate a potential emergency and should be followed up as soon as possible.  Feel free to call the clinic should you have any questions or concerns. The clinic phone number is [phone].  Please show the CHEMO ALERT CARD at check-in to the Emergency Department and triage nurse.   

## 2018-10-16 NOTE — Progress Notes (Signed)
Pt presents today for treatment. VS within parameters for treatment. MAR reviewed. Pt states she is taking her Arimidex as prescribed daily. Labs pending at this time.   Treatment given today per MD orders. Tolerated infusion without adverse affects. Vital signs stable. No complaints at this time. Discharged from clinic ambulatory. F/U with [facility] as scheduled.

## 2018-10-25 ENCOUNTER — Other Ambulatory Visit (HOSPITAL_COMMUNITY): Payer: Self-pay | Admitting: Hematology

## 2018-10-25 DIAGNOSIS — Z170 Estrogen receptor positive status [ER+]: Secondary | ICD-10-CM

## 2018-10-25 DIAGNOSIS — C50412 Malignant neoplasm of upper-outer quadrant of left female breast: Secondary | ICD-10-CM

## 2018-11-05 ENCOUNTER — Other Ambulatory Visit: Payer: Self-pay

## 2018-11-06 ENCOUNTER — Encounter (HOSPITAL_COMMUNITY): Payer: Self-pay | Admitting: Hematology

## 2018-11-06 ENCOUNTER — Inpatient Hospital Stay (HOSPITAL_BASED_OUTPATIENT_CLINIC_OR_DEPARTMENT_OTHER): Payer: Medicaid Other | Admitting: Hematology

## 2018-11-06 ENCOUNTER — Inpatient Hospital Stay (HOSPITAL_COMMUNITY): Payer: Medicaid Other

## 2018-11-06 ENCOUNTER — Inpatient Hospital Stay (HOSPITAL_COMMUNITY): Payer: Medicaid Other | Attending: Hematology

## 2018-11-06 ENCOUNTER — Other Ambulatory Visit: Payer: Self-pay

## 2018-11-06 VITALS — BP 155/55 | HR 57 | Temp 96.8°F | Resp 18 | Wt 168.2 lb

## 2018-11-06 DIAGNOSIS — Z170 Estrogen receptor positive status [ER+]: Secondary | ICD-10-CM | POA: Diagnosis not present

## 2018-11-06 DIAGNOSIS — C50412 Malignant neoplasm of upper-outer quadrant of left female breast: Secondary | ICD-10-CM | POA: Insufficient documentation

## 2018-11-06 DIAGNOSIS — G620 Drug-induced polyneuropathy: Secondary | ICD-10-CM | POA: Diagnosis not present

## 2018-11-06 DIAGNOSIS — I10 Essential (primary) hypertension: Secondary | ICD-10-CM | POA: Diagnosis not present

## 2018-11-06 DIAGNOSIS — Z79811 Long term (current) use of aromatase inhibitors: Secondary | ICD-10-CM | POA: Insufficient documentation

## 2018-11-06 DIAGNOSIS — M8580 Other specified disorders of bone density and structure, unspecified site: Secondary | ICD-10-CM | POA: Insufficient documentation

## 2018-11-06 DIAGNOSIS — Z79899 Other long term (current) drug therapy: Secondary | ICD-10-CM | POA: Diagnosis not present

## 2018-11-06 DIAGNOSIS — Z5112 Encounter for antineoplastic immunotherapy: Secondary | ICD-10-CM | POA: Insufficient documentation

## 2018-11-06 LAB — CBC WITH DIFFERENTIAL/PLATELET
Abs Immature Granulocytes: 0.03 10*3/uL (ref 0.00–0.07)
Basophils Absolute: 0 10*3/uL (ref 0.0–0.1)
Basophils Relative: 1 %
Eosinophils Absolute: 0.3 10*3/uL (ref 0.0–0.5)
Eosinophils Relative: 4 %
HCT: 36 % (ref 36.0–46.0)
Hemoglobin: 12.4 g/dL (ref 12.0–15.0)
Immature Granulocytes: 1 %
Lymphocytes Relative: 27 %
Lymphs Abs: 1.8 10*3/uL (ref 0.7–4.0)
MCH: 31.6 pg (ref 26.0–34.0)
MCHC: 34.4 g/dL (ref 30.0–36.0)
MCV: 91.8 fL (ref 80.0–100.0)
Monocytes Absolute: 0.5 10*3/uL (ref 0.1–1.0)
Monocytes Relative: 7 %
Neutro Abs: 4 10*3/uL (ref 1.7–7.7)
Neutrophils Relative %: 60 %
Platelets: 200 10*3/uL (ref 150–400)
RBC: 3.92 MIL/uL (ref 3.87–5.11)
RDW: 12 % (ref 11.5–15.5)
WBC: 6.6 10*3/uL (ref 4.0–10.5)
nRBC: 0 % (ref 0.0–0.2)

## 2018-11-06 LAB — COMPREHENSIVE METABOLIC PANEL
ALT: 34 U/L (ref 0–44)
AST: 34 U/L (ref 15–41)
Albumin: 3.7 g/dL (ref 3.5–5.0)
Alkaline Phosphatase: 54 U/L (ref 38–126)
Anion gap: 11 (ref 5–15)
BUN: 14 mg/dL (ref 8–23)
CO2: 24 mmol/L (ref 22–32)
Calcium: 9.3 mg/dL (ref 8.9–10.3)
Chloride: 101 mmol/L (ref 98–111)
Creatinine, Ser: 0.95 mg/dL (ref 0.44–1.00)
GFR calc Af Amer: >60 mL/min (ref >60)
GFR calc non Af Amer: >60 mL/min (ref >60)
Glucose, Bld: 214 mg/dL — ABNORMAL HIGH (ref 70–99)
Potassium: 3.7 mmol/L (ref 3.5–5.1)
Sodium: 136 mmol/L (ref 135–145)
Total Bilirubin: 0.2 mg/dL — ABNORMAL LOW (ref 0.3–1.2)
Total Protein: 7.5 g/dL (ref 6.5–8.1)

## 2018-11-06 LAB — LACTATE DEHYDROGENASE: LDH: 125 U/L (ref 98–192)

## 2018-11-06 MED ORDER — ALTEPLASE 2 MG IJ SOLR: 2.0000 mg | Freq: Once | INTRAMUSCULAR | Status: DC | PRN

## 2018-11-06 MED ORDER — DIPHENHYDRAMINE HCL 25 MG PO CAPS
50.0000 mg | ORAL_CAPSULE | Freq: Once | ORAL | Status: AC
  Administered 2018-11-06: 50 mg via ORAL
  Filled 2018-11-06: qty 2

## 2018-11-06 MED ORDER — ACETAMINOPHEN 325 MG PO TABS
650.0000 mg | ORAL_TABLET | Freq: Once | ORAL | Status: AC
  Administered 2018-11-06: 650 mg via ORAL
  Filled 2018-11-06: qty 2

## 2018-11-06 MED ORDER — HEPARIN SOD (PORK) LOCK FLUSH 100 UNIT/ML IV SOLN
500.0000 [IU] | Freq: Once | INTRAVENOUS | Status: AC | PRN
  Administered 2018-11-06: 500 [IU]

## 2018-11-06 MED ORDER — SODIUM CHLORIDE 0.9% FLUSH
10.0000 mL | INTRAVENOUS | Status: DC | PRN
  Administered 2018-11-06: 10 mL
  Filled 2018-11-06: qty 10

## 2018-11-06 MED ORDER — TRASTUZUMAB CHEMO 150 MG IV SOLR
450.0000 mg | Freq: Once | INTRAVENOUS | Status: AC
  Administered 2018-11-06: 14:00:00 450 mg via INTRAVENOUS
  Filled 2018-11-06: qty 21.43

## 2018-11-06 MED ORDER — SODIUM CHLORIDE 0.9 % IV SOLN
Freq: Once | INTRAVENOUS | Status: AC
  Administered 2018-11-06: 13:00:00 via INTRAVENOUS

## 2018-11-06 NOTE — Patient Instructions (Signed)
[facility] Discharge Instructions  You were seen today by Dr. Katragadda. He went over your recent lab results. He will see you back in 6 weeks for labs and follow up.   Thank you for choosing [facility] at [facility] to provide your oncology and hematology care.  To afford each patient quality time with our provider, please arrive at least 15 minutes before your scheduled appointment time.   If you have a lab appointment with the Cancer Center please come in thru the  Main Entrance and check in at the main information desk  You need to re-schedule your appointment should you arrive 10 or more minutes late.  We strive to give you quality time with our providers, and arriving late affects you and other patients whose appointments are after yours.  Also, if you no show three or more times for appointments you may be dismissed from the clinic at the providers discretion.     Again, thank you for choosing [facility].  Our hope is that these requests will decrease the amount of time that you wait before being seen by our physicians.       _____________________________________________________________  Should you have questions after your visit to [facility], please contact our office at [phone] between the hours of 8:00 a.m. and 4:30 p.m.  Voicemails left after 4:00 p.m. will not be returned until the following business day.  For prescription refill requests, have your pharmacy contact our office and allow 72 hours.    Cancer Center Support Programs:   > Cancer Support Group  2nd Tuesday of the month 1pm-2pm, Journey Room    

## 2018-11-06 NOTE — Progress Notes (Signed)
Lisa Thornton, [place] [address]   CLINIC:  Medical Oncology/Hematology  PCP:  [facility] [place] [facility] [address] [place] [address] [phone]   REASON FOR VISIT:  Follow-up for HER-2 positive breast cancer with ongoing Herceptin.   BRIEF ONCOLOGIC HISTORY:  Oncology History  Breast cancer of upper-outer quadrant of left female breast ([place])  06/22/2017 Initial Diagnosis   Breast cancer of upper-outer quadrant of left female breast ([place])   06/22/2017 Cancer Staging   Staging form: Breast, AJCC 8th Edition - Clinical stage from 06/22/2017: Stage IIIB (cT3, cN1, cM0, G3, ER+, PR-, HER2-) - Signed by Lisa Jack, MD on 06/22/2017   06/29/2017 Imaging   CT chest showing left axillary adenopathy, 1.2 cm anterior carinal adenopathy, left subpectoral lymph node subcentimeter  06/25/2017 2D echocardiogram with ejection fraction of 55-60%   07/16/2017 - 12/12/2017 Chemotherapy   The patient had DOXOrubicin (ADRIAMYCIN) chemo injection 110 mg, 60 mg/m2 = 110 mg, Intravenous,  Once, 4 of 4 cycles Administration: 110 mg (07/18/2017), 110 mg (08/01/2017), 110 mg (08/20/2017), 110 mg (09/03/2017) palonosetron (ALOXI) injection 0.25 mg, 0.25 mg, Intravenous,  Once, 4 of 4 cycles Administration: 0.25 mg (07/18/2017), 0.25 mg (08/01/2017), 0.25 mg (08/20/2017), 0.25 mg (09/03/2017) pegfilgrastim-cbqv (UDENYCA) injection 6 mg, 6 mg, Subcutaneous, Once, 4 of 4 cycles Administration: 6 mg (07/19/2017), 6 mg (08/02/2017), 6 mg (08/22/2017), 6 mg (09/05/2017) cyclophosphamide (CYTOXAN) 1,100 mg in sodium chloride 0.9 % 250 mL chemo infusion, 600 mg/m2 = 1,100 mg, Intravenous,  Once, 4 of 4 cycles Administration: 1,100 mg (07/18/2017), 1,100 mg (08/01/2017), 1,100 mg (08/20/2017), 1,100 mg (09/03/2017) PACLitaxel (TAXOL) 120 mg in sodium chloride 0.9 % 250 mL chemo infusion (</= 45m/m2), 64 mg/m2 = 120 mg (80 % of original dose 80 mg/m2), Intravenous,  Once, 12 of 12  cycles Dose modification: 64 mg/m2 (80 % of original dose 80 mg/m2, Cycle 5, Reason: Provider Judgment), 60 mg/m2 (75 % of original dose 80 mg/m2, Cycle 15, Reason: Other (see comments)), 40 mg/m2 (50 % of original dose 80 mg/m2, Cycle 16, Reason: Other (see comments)) Administration: 120 mg (09/17/2017), 144 mg (09/24/2017), 144 mg (10/03/2017), 144 mg (10/11/2017), 144 mg (10/18/2017), 144 mg (10/25/2017), 144 mg (11/01/2017), 144 mg (11/08/2017), 144 mg (11/15/2017), 144 mg (11/22/2017), 108 mg (11/29/2017), 72 mg (12/06/2017) fosaprepitant (EMEND) 150 mg, dexamethasone (DECADRON) 12 mg in sodium chloride 0.9 % 145 mL IVPB, , Intravenous,  Once, 4 of 4 cycles Administration:  (07/18/2017),  (08/01/2017),  (08/20/2017),  (09/03/2017)  for chemotherapy treatment.    02/07/2018 -  Chemotherapy   The patient had trastuzumab (HERCEPTIN) 600 mg in sodium chloride 0.9 % 250 mL chemo infusion, 567 mg, Intravenous,  Once, 14 of 18 cycles Administration: 600 mg (02/07/2018), 450 mg (02/27/2018), 450 mg (05/01/2018), 450 mg (05/22/2018), 450 mg (06/13/2018), 450 mg (07/03/2018), 450 mg (07/24/2018), 450 mg (08/15/2018), 450 mg (09/06/2018), 450 mg (03/20/2018), 450 mg (09/25/2018), 450 mg (10/16/2018), 450 mg (04/10/2018)  for chemotherapy treatment.       CANCER STAGING: Cancer Staging Breast cancer of upper-outer quadrant of left female breast ([facility] Staging form: Breast, AJCC 8th Edition - Clinical stage from 06/22/2017: Stage IIIB (cT3, cN1, cM0, G3, ER+, PR-, HER2-) - Signed by Lisa Jack MD on 06/22/2017    INTERVAL HISTORY:  [name] female seen for follow-up of HER-2 positive breast cancer.  She is continuing to tolerate Herceptin very well.  Denies any symptoms of PND or orthopnea.  She is tolerating anastrozole very well.  Appetite is 100%.  Energy levels are 100%.  She reportedly went to the beach 2 weeks ago.  Denies any fevers or night sweats or weight loss in the last 3 months.  No new onset pains reported.   Denies any nausea, vomiting or diarrhea.  No bleeding per rectum or melena.     REVIEW OF SYSTEMS:  Review of Systems  All other systems reviewed and are negative.    PAST MEDICAL/SURGICAL HISTORY:  Past Medical History:  Diagnosis Date   Cancer [facility])    left breast cancer   Chest pain    Hypertension    Past Surgical History:  Procedure Laterality Date   MASTECTOMY MODIFIED RADICAL Left 12/31/2017   Procedure: LEFT MODIFIED RADICAL MASTECTOMY;  Surgeon: Lisa Signs, MD;  Location: [facility];  Service: General;  Laterality: Left;   PORTACATH PLACEMENT Right 06/27/2017   Procedure: INSERTION PORT-A-CATH;  Surgeon: Lisa Signs, MD;  Location: [facility];  Service: General;  Laterality: Right;     SOCIAL HISTORY:  Social History   Socioeconomic History   Marital status: Divorced    Spouse name: Not on file   Number of children: Not on file   Years of education: Not on file   Highest education level: Not on file  Occupational History   Not on file  Social Needs   Financial resource strain: Somewhat hard   Food insecurity    Worry: Never true    Inability: Never true   Transportation needs    Medical: No    Non-medical: No  Tobacco Use   Smoking status: Never Smoker   Smokeless tobacco: Never Used  Substance and Sexual Activity   Alcohol use: No   Drug use: No   Sexual activity: Not Currently  Lifestyle   Physical activity    Days per week: 3 days    Minutes per session: 10 min   Stress: Very much  Relationships   Social connections    Talks on phone: More than three times a week    Gets together: More than three times a week    Attends religious service: More than 4 times per year    Active member of club or organization: Yes    Attends meetings of clubs or organizations: Never    Relationship status: Divorced   Intimate partner violence    Fear of current or ex partner: Patient refused    Emotionally abused: Patient refused     Physically abused: Patient refused    Forced sexual activity: Patient refused  Other Topics Concern   Not on file  Social History Narrative   Not on file    FAMILY HISTORY:  Family History  Problem Relation Age of Onset   Breast cancer Mother    Thyroid disease Mother    Heart disease Mother    Heart attack Father    Heart attack Sister    Hypertension Sister    Heart attack Brother    Cancer Sister    Stroke Brother    Alzheimer's disease Maternal Aunt     CURRENT MEDICATIONS:  Outpatient Encounter Medications as of 11/06/2018  Medication Sig   amLODipine (NORVASC) 5 MG tablet Take 1 tablet (5 mg total) by mouth daily.   anastrozole (ARIMIDEX) 1 MG tablet Take 1 tablet by mouth once daily   HYDROcodone-acetaminophen (NORCO) 5-325 MG tablet Take 1 tablet by mouth every 4 (four) hours as needed for moderate pain.  metoprolol tartrate (LOPRESSOR) 50 MG tablet Take 50 mg by mouth 2 (two) times daily.   ondansetron (ZOFRAN) 8 MG tablet Take 1 tablet (8 mg total) by mouth 2 (two) times daily as needed. Start on the third day after chemotherapy.   prochlorperazine (COMPAZINE) 10 MG tablet Take 1 tablet (10 mg total) by mouth every 6 (six) hours as needed (Nausea or vomiting).   Trastuzumab (HERCEPTIN IV) Inject into the vein.   No facility-administered encounter medications on file as of 11/06/2018.     ALLERGIES:  No Known Allergies   PHYSICAL EXAM:  ECOG Performance status: 1  Vitals:   11/06/18 1150  BP: (!) 158/82  Pulse: 82  Resp: 18  Temp: (!) 97 F (36.1 C)  SpO2: 98%   There were no vitals filed for this visit.  Physical Exam Vitals Thornton reviewed.  Constitutional:      Appearance: Normal appearance.  Cardiovascular:     Rate and Rhythm: Normal rate and regular rhythm.     Heart sounds: Normal heart sounds.  Pulmonary:     Effort: Pulmonary effort is normal.     Breath sounds: Normal breath sounds.  Abdominal:     General: There is  no distension.     Palpations: Abdomen is soft.     Tenderness: There is no abdominal tenderness.  Musculoskeletal:        General: No swelling.  Skin:    General: Skin is warm.  Neurological:     General: No focal deficit present.     Mental Status: She is alert and oriented to person, place, and time.  Psychiatric:        Mood and Affect: Mood normal.   Left mastectomy site is within normal limits.  Right breast has no palpable masses.   LABORATORY DATA:  I have reviewed the labs as listed.  CBC    Component Value Date/Time   WBC 6.6 11/06/2018 1115   RBC 3.92 11/06/2018 1115   HGB 12.4 11/06/2018 1115   HCT 36.0 11/06/2018 1115   PLT 200 11/06/2018 1115   MCV 91.8 11/06/2018 1115   MCH 31.6 11/06/2018 1115   MCHC 34.4 11/06/2018 1115   RDW 12.0 11/06/2018 1115   LYMPHSABS 1.8 11/06/2018 1115   MONOABS 0.5 11/06/2018 1115   EOSABS 0.3 11/06/2018 1115   BASOSABS 0.0 11/06/2018 1115   CMP Latest Ref Rng & Units 11/06/2018 10/16/2018 09/25/2018  Glucose 70 - 99 mg/dL 214(H) 164(H) 161(H)  BUN 8 - 23 mg/dL _0 Creatinine 0.44 - 1.00 mg/dL 0.95 0.98 0.89  Sodium 135 - 145 mmol/L 136 137 137  Potassium 3.5 - 5.1 mmol/L 3.7 3.8 4.2  Chloride 98 - 111 mmol/L 101 100 100  CO2 22 - 32 mmol/L _1 Calcium 8.9 - 10.3 mg/dL 9.3 10.2 10.0  Total Protein 6.5 - 8.1 g/dL 7.5 7.6 7.5  Total Bilirubin 0.3 - 1.2 mg/dL 0.2(L) 0.5 0.3  Alkaline Phos 38 - 126 U/L 54 53 56  AST 15 - 41 U/L 34 38 40  ALT 0 - 44 U/L 34 37 37       DIAGNOSTIC IMAGING:  I have independently reviewed the scans and discussed with the patient.   I have reviewed Lisa Lick LPN's note and agree with the documentation.  I personally performed a face-to-face visit, made revisions and my assessment and plan is as follows.    ASSESSMENT & PLAN:   Breast cancer of upper-outer  quadrant of left female breast ([place]) 1.  Poorly differentiated left breast cancer stage IIIb, (cT3cN1), ER positive, PR  and HER-2 positive: - 4 cycles of neoadjuvant dose dense AC from 4 72,018 through 09/03/2017. -12 cycles of paclitaxel from 09/17/2017 through 12/06/2017.  - Left modified radical mastectomy on 12/31/2017.  Pathology showed 2.3 cm poorly differentiated IDC with sarcomatoid changes, resection margins negative, high-grade DCIS, no skin involvement, 0 out of 8 lymph nodes positive, negative for lymphovascular/perineural invasion.  Pathological staging is YPT2Y PN 0.  HER-2 was positive by IHC on repeat testing of receptors.  Initial fish testing on biopsy was negative.  ER was negative on mastectomy specimen.  Ki 67 decreased to 30% from 80% on biopsy. - She was recommended 1 year of Herceptin.  Pertuzumab was not added secondary to lymph node negativity. - Herceptin every 3 weeks started on 02/07/2018.  She is tolerating it very well. -Anastrozole started on 02/17/2018.  She denies any musculoskeletal symptoms or hot flashes. -Right breast mammogram on 05/31/2018 was BI-RADS 1.  Left mastectomy site is within normal limits.  No palpable lymphadenopathy. - Echocardiogram on 10/14/2018 was showing EF of 60 to 65%.   2.  Osteopenia: -DEXA scan on 03/07/2018 shows T score of -1.8. -She will continue taking calcium and vitamin D twice daily.  I plan to repeat DEXA scan in 2 years.  3.  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  She has mild numbness in the fingertips which is constant.  No medication needed.     Total time spent is 25 minutes with more than 50% of the time spent face-to-face discussing treatment plan, counseling and coordination of care.    Orders placed this encounter:  No orders of the defined types were placed in this encounter.     Lisa Jack, MD [place] [phone]

## 2018-11-06 NOTE — Assessment & Plan Note (Signed)
1.  Poorly differentiated left breast cancer stage IIIb, (cT3cN1), ER positive, PR and HER-2 positive: - 4 cycles of neoadjuvant dose dense AC from 4 72,018 through 09/03/2017. -12 cycles of paclitaxel from 09/17/2017 through 12/06/2017.  - Left modified radical mastectomy on 12/31/2017.  Pathology showed 2.3 cm poorly differentiated IDC with sarcomatoid changes, resection margins negative, high-grade DCIS, no skin involvement, 0 out of 8 lymph nodes positive, negative for lymphovascular/perineural invasion.  Pathological staging is YPT2Y PN 0.  HER-2 was positive by IHC on repeat testing of receptors.  Initial fish testing on biopsy was negative.  ER was negative on mastectomy specimen.  Ki 67 decreased to 30% from 80% on biopsy. - She was recommended 1 year of Herceptin.  Pertuzumab was not added secondary to lymph node negativity. - Herceptin every 3 weeks started on 02/07/2018.  She is tolerating it very well. -Anastrozole started on 02/17/2018.  She denies any musculoskeletal symptoms or hot flashes. -Right breast mammogram on 05/31/2018 was BI-RADS 1.  Left mastectomy site is within normal limits.  No palpable lymphadenopathy. - Echocardiogram on 10/14/2018 was showing EF of 60 to 65%.   2.  Osteopenia: -DEXA scan on 03/07/2018 shows T score of -1.8. -She will continue taking calcium and vitamin D twice daily.  I plan to repeat DEXA scan in 2 years.  3.  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  She has mild numbness in the fingertips which is constant.  No medication needed.

## 2018-11-06 NOTE — Patient Instructions (Signed)
[facility] Discharge Instructions for Patients Receiving Chemotherapy  Today you received the following chemotherapy agents   To help prevent nausea and vomiting after your treatment, we encourage you to take your nausea medication   If you develop nausea and vomiting that is not controlled by your nausea medication, call the clinic.   BELOW ARE SYMPTOMS THAT SHOULD BE REPORTED IMMEDIATELY:  *FEVER GREATER THAN 100.5 F  *CHILLS WITH OR WITHOUT FEVER  NAUSEA AND VOMITING THAT IS NOT CONTROLLED WITH YOUR NAUSEA MEDICATION  *UNUSUAL SHORTNESS OF BREATH  *UNUSUAL BRUISING OR BLEEDING  TENDERNESS IN MOUTH AND THROAT WITH OR WITHOUT PRESENCE OF ULCERS  *URINARY PROBLEMS  *BOWEL PROBLEMS  UNUSUAL RASH Items with * indicate a potential emergency and should be followed up as soon as possible.  Feel free to call the clinic should you have any questions or concerns. The clinic phone number is [phone].  Please show the CHEMO ALERT CARD at check-in to the Emergency Department and triage nurse.   

## 2018-11-06 NOTE — Progress Notes (Signed)
Message received from [facility] LPN. Proceed with treatment. VS within parameters for tx. Pt seen by Dr. Delton Coombes today. Labs reviewed.   Treatment given today per MD orders. Tolerated infusion without adverse affects. Vital signs stable. No complaints at this time. Discharged from clinic ambulatory. F/U with [facility] as scheduled.

## 2018-11-27 ENCOUNTER — Inpatient Hospital Stay (HOSPITAL_COMMUNITY): Payer: Medicaid Other

## 2018-11-27 ENCOUNTER — Encounter (HOSPITAL_COMMUNITY): Payer: Self-pay

## 2018-11-27 ENCOUNTER — Other Ambulatory Visit: Payer: Self-pay

## 2018-11-27 VITALS — BP 130/62 | HR 58 | Temp 96.6°F | Resp 18 | Wt 167.2 lb

## 2018-11-27 DIAGNOSIS — C50412 Malignant neoplasm of upper-outer quadrant of left female breast: Secondary | ICD-10-CM

## 2018-11-27 DIAGNOSIS — Z5112 Encounter for antineoplastic immunotherapy: Secondary | ICD-10-CM | POA: Diagnosis not present

## 2018-11-27 DIAGNOSIS — Z170 Estrogen receptor positive status [ER+]: Secondary | ICD-10-CM

## 2018-11-27 LAB — CBC WITH DIFFERENTIAL/PLATELET
Abs Immature Granulocytes: 0.01 10*3/uL (ref 0.00–0.07)
Basophils Absolute: 0 10*3/uL (ref 0.0–0.1)
Basophils Relative: 0 %
Eosinophils Absolute: 0.3 10*3/uL (ref 0.0–0.5)
Eosinophils Relative: 4 %
HCT: 35.1 % — ABNORMAL LOW (ref 36.0–46.0)
Hemoglobin: 12.1 g/dL (ref 12.0–15.0)
Immature Granulocytes: 0 %
Lymphocytes Relative: 27 %
Lymphs Abs: 1.9 10*3/uL (ref 0.7–4.0)
MCH: 32 pg (ref 26.0–34.0)
MCHC: 34.5 g/dL (ref 30.0–36.0)
MCV: 92.9 fL (ref 80.0–100.0)
Monocytes Absolute: 0.5 10*3/uL (ref 0.1–1.0)
Monocytes Relative: 7 %
Neutro Abs: 4.4 10*3/uL (ref 1.7–7.7)
Neutrophils Relative %: 62 %
Platelets: 190 10*3/uL (ref 150–400)
RBC: 3.78 MIL/uL — ABNORMAL LOW (ref 3.87–5.11)
RDW: 12.1 % (ref 11.5–15.5)
WBC: 7.1 10*3/uL (ref 4.0–10.5)
nRBC: 0 % (ref 0.0–0.2)

## 2018-11-27 LAB — COMPREHENSIVE METABOLIC PANEL
ALT: 29 U/L (ref 0–44)
AST: 31 U/L (ref 15–41)
Albumin: 3.6 g/dL (ref 3.5–5.0)
Alkaline Phosphatase: 53 U/L (ref 38–126)
Anion gap: 8 (ref 5–15)
BUN: 14 mg/dL (ref 8–23)
CO2: 24 mmol/L (ref 22–32)
Calcium: 9.6 mg/dL (ref 8.9–10.3)
Chloride: 104 mmol/L (ref 98–111)
Creatinine, Ser: 0.93 mg/dL (ref 0.44–1.00)
GFR calc Af Amer: >60 mL/min (ref >60)
GFR calc non Af Amer: >60 mL/min (ref >60)
Glucose, Bld: 190 mg/dL — ABNORMAL HIGH (ref 70–99)
Potassium: 4.6 mmol/L (ref 3.5–5.1)
Sodium: 136 mmol/L (ref 135–145)
Total Bilirubin: 0.6 mg/dL (ref 0.3–1.2)
Total Protein: 7.5 g/dL (ref 6.5–8.1)

## 2018-11-27 LAB — LACTATE DEHYDROGENASE: LDH: 124 U/L (ref 98–192)

## 2018-11-27 MED ORDER — DIPHENHYDRAMINE HCL 25 MG PO CAPS
50.0000 mg | ORAL_CAPSULE | Freq: Once | ORAL | Status: AC
  Administered 2018-11-27: 50 mg via ORAL
  Filled 2018-11-27: qty 2

## 2018-11-27 MED ORDER — SODIUM CHLORIDE 0.9% FLUSH
10.0000 mL | INTRAVENOUS | Status: DC | PRN
  Administered 2018-11-27: 13:00:00 10 mL
  Filled 2018-11-27: qty 10

## 2018-11-27 MED ORDER — HEPARIN SOD (PORK) LOCK FLUSH 100 UNIT/ML IV SOLN
500.0000 [IU] | Freq: Once | INTRAVENOUS | Status: AC | PRN
  Administered 2018-11-27: 14:00:00 500 [IU]

## 2018-11-27 MED ORDER — TRASTUZUMAB CHEMO 150 MG IV SOLR
450.0000 mg | Freq: Once | INTRAVENOUS | Status: AC
  Administered 2018-11-27: 450 mg via INTRAVENOUS
  Filled 2018-11-27: qty 21.43

## 2018-11-27 MED ORDER — SODIUM CHLORIDE 0.9 % IV SOLN
Freq: Once | INTRAVENOUS | Status: AC
  Administered 2018-11-27: 13:00:00 via INTRAVENOUS

## 2018-11-27 MED ORDER — ACETAMINOPHEN 325 MG PO TABS
650.0000 mg | ORAL_TABLET | Freq: Once | ORAL | Status: AC
  Administered 2018-11-27: 650 mg via ORAL
  Filled 2018-11-27: qty 2

## 2018-11-27 NOTE — Patient Instructions (Signed)
[facility] Discharge Instructions for Patients Receiving Chemotherapy   Beginning January 23rd 2017 lab work for the Cancer Center will be done in the  Main lab at [facility] on 1st floor. If you have a lab appointment with the Cancer Center please come in thru the  Main Entrance and check in at the main information desk   Today you received the following chemotherapy agents Herceptin. Follow-up as scheduled. Call clinic for any questions or concerns  To help prevent nausea and vomiting after your treatment, we encourage you to take your nausea medication.   If you develop nausea and vomiting, or diarrhea that is not controlled by your medication, call the clinic.  The clinic phone number is [phone]. Office hours are Monday-Friday 8:30am-5:00pm.  BELOW ARE SYMPTOMS THAT SHOULD BE REPORTED IMMEDIATELY:  *FEVER GREATER THAN 101.0 F  *CHILLS WITH OR WITHOUT FEVER  NAUSEA AND VOMITING THAT IS NOT CONTROLLED WITH YOUR NAUSEA MEDICATION  *UNUSUAL SHORTNESS OF BREATH  *UNUSUAL BRUISING OR BLEEDING  TENDERNESS IN MOUTH AND THROAT WITH OR WITHOUT PRESENCE OF ULCERS  *URINARY PROBLEMS  *BOWEL PROBLEMS  UNUSUAL RASH Items with * indicate a potential emergency and should be followed up as soon as possible. If you have an emergency after office hours please contact your primary care physician or go to the nearest emergency department.  Please call the clinic during office hours if you have any questions or concerns.   You may also contact the Patient Navigator at [phone] should you have any questions or need assistance in obtaining follow up care.      Resources For Cancer Patients and their Caregivers ? American Cancer Society: Can assist with transportation, wigs, general needs, runs Look Good Feel Better.        1-888-227-6333 ? Cancer Care: Provides financial assistance, online support groups, medication/co-pay assistance.  1-800-813-HOPE  (4673) ? [facility] Assists [place] cancer patients and their families through emotional , educational and financial support.  336-427-4357 ? [place] DSS Where to apply for food stamps, Medicaid and utility assistance. 336-342-1394 ? [facility]: Transportation to medical appointments. 336-347-2287 ? Social Security Administration: May apply for disability if have a Stage IV cancer. 336-342-7796 1-800-772-1213 ? [place] [facility]ging, Disability and Transit Services: Assists with nutrition, care and transit needs. 336-349-2343         

## 2018-11-27 NOTE — Progress Notes (Signed)
Lisa Thornton tolerated Herceptin infusion well without complaints or incident. Labs reviewed prior to discharge.VSS upon discharge.Pt discharged self ambulatory in satisfactory condition

## 2018-12-18 ENCOUNTER — Other Ambulatory Visit (HOSPITAL_COMMUNITY): Payer: Medicaid Other

## 2018-12-18 ENCOUNTER — Ambulatory Visit (HOSPITAL_COMMUNITY): Payer: Medicaid Other | Admitting: Hematology

## 2018-12-18 ENCOUNTER — Ambulatory Visit (HOSPITAL_COMMUNITY): Payer: Medicaid Other

## 2018-12-23 ENCOUNTER — Other Ambulatory Visit: Payer: Self-pay

## 2018-12-23 ENCOUNTER — Inpatient Hospital Stay (HOSPITAL_BASED_OUTPATIENT_CLINIC_OR_DEPARTMENT_OTHER): Payer: Medicaid Other | Admitting: Hematology

## 2018-12-23 ENCOUNTER — Inpatient Hospital Stay (HOSPITAL_COMMUNITY): Payer: Medicaid Other

## 2018-12-23 ENCOUNTER — Encounter (HOSPITAL_COMMUNITY): Payer: Self-pay | Admitting: Hematology

## 2018-12-23 ENCOUNTER — Inpatient Hospital Stay (HOSPITAL_COMMUNITY): Payer: Medicaid Other | Attending: Hematology

## 2018-12-23 VITALS — BP 121/60 | HR 68 | Temp 97.5°F | Resp 18 | Wt 168.8 lb

## 2018-12-23 DIAGNOSIS — Z9012 Acquired absence of left breast and nipple: Secondary | ICD-10-CM | POA: Insufficient documentation

## 2018-12-23 DIAGNOSIS — Z170 Estrogen receptor positive status [ER+]: Secondary | ICD-10-CM

## 2018-12-23 DIAGNOSIS — C50412 Malignant neoplasm of upper-outer quadrant of left female breast: Secondary | ICD-10-CM

## 2018-12-23 DIAGNOSIS — Z79811 Long term (current) use of aromatase inhibitors: Secondary | ICD-10-CM | POA: Insufficient documentation

## 2018-12-23 DIAGNOSIS — Z5112 Encounter for antineoplastic immunotherapy: Secondary | ICD-10-CM | POA: Diagnosis present

## 2018-12-23 DIAGNOSIS — M8580 Other specified disorders of bone density and structure, unspecified site: Secondary | ICD-10-CM | POA: Diagnosis not present

## 2018-12-23 DIAGNOSIS — Z79899 Other long term (current) drug therapy: Secondary | ICD-10-CM | POA: Diagnosis not present

## 2018-12-23 DIAGNOSIS — I10 Essential (primary) hypertension: Secondary | ICD-10-CM | POA: Insufficient documentation

## 2018-12-23 DIAGNOSIS — G629 Polyneuropathy, unspecified: Secondary | ICD-10-CM | POA: Insufficient documentation

## 2018-12-23 LAB — COMPREHENSIVE METABOLIC PANEL
ALT: 34 U/L (ref 0–44)
AST: 39 U/L (ref 15–41)
Albumin: 3.5 g/dL (ref 3.5–5.0)
Alkaline Phosphatase: 47 U/L (ref 38–126)
Anion gap: 7 (ref 5–15)
BUN: 15 mg/dL (ref 8–23)
CO2: 26 mmol/L (ref 22–32)
Calcium: 9.4 mg/dL (ref 8.9–10.3)
Chloride: 102 mmol/L (ref 98–111)
Creatinine, Ser: 0.85 mg/dL (ref 0.44–1.00)
GFR calc Af Amer: >60 mL/min (ref >60)
GFR calc non Af Amer: >60 mL/min (ref >60)
Glucose, Bld: 229 mg/dL — ABNORMAL HIGH (ref 70–99)
Potassium: 4.5 mmol/L (ref 3.5–5.1)
Sodium: 135 mmol/L (ref 135–145)
Total Bilirubin: 0.4 mg/dL (ref 0.3–1.2)
Total Protein: 7.2 g/dL (ref 6.5–8.1)

## 2018-12-23 LAB — CBC WITH DIFFERENTIAL/PLATELET
Abs Immature Granulocytes: 0.02 10*3/uL (ref 0.00–0.07)
Basophils Absolute: 0 10*3/uL (ref 0.0–0.1)
Basophils Relative: 0 %
Eosinophils Absolute: 0.2 10*3/uL (ref 0.0–0.5)
Eosinophils Relative: 3 %
HCT: 35.4 % — ABNORMAL LOW (ref 36.0–46.0)
Hemoglobin: 12 g/dL (ref 12.0–15.0)
Immature Granulocytes: 0 %
Lymphocytes Relative: 28 %
Lymphs Abs: 1.9 10*3/uL (ref 0.7–4.0)
MCH: 32 pg (ref 26.0–34.0)
MCHC: 33.9 g/dL (ref 30.0–36.0)
MCV: 94.4 fL (ref 80.0–100.0)
Monocytes Absolute: 0.4 10*3/uL (ref 0.1–1.0)
Monocytes Relative: 6 %
Neutro Abs: 4.3 10*3/uL (ref 1.7–7.7)
Neutrophils Relative %: 63 %
Platelets: 207 10*3/uL (ref 150–400)
RBC: 3.75 MIL/uL — ABNORMAL LOW (ref 3.87–5.11)
RDW: 12.4 % (ref 11.5–15.5)
WBC: 6.9 10*3/uL (ref 4.0–10.5)
nRBC: 0 % (ref 0.0–0.2)

## 2018-12-23 LAB — LACTATE DEHYDROGENASE: LDH: 142 U/L (ref 98–192)

## 2018-12-23 MED ORDER — SODIUM CHLORIDE 0.9 % IV SOLN
Freq: Once | INTRAVENOUS | Status: AC
  Administered 2018-12-23: 09:00:00 via INTRAVENOUS

## 2018-12-23 MED ORDER — HEPARIN SOD (PORK) LOCK FLUSH 100 UNIT/ML IV SOLN
500.0000 [IU] | Freq: Once | INTRAVENOUS | Status: AC | PRN
  Administered 2018-12-23: 500 [IU]

## 2018-12-23 MED ORDER — TRASTUZUMAB-DKST CHEMO 150 MG IV SOLR
450.0000 mg | Freq: Once | INTRAVENOUS | Status: AC
  Administered 2018-12-23: 450 mg via INTRAVENOUS
  Filled 2018-12-23: qty 21.43

## 2018-12-23 MED ORDER — ACETAMINOPHEN 325 MG PO TABS
650.0000 mg | ORAL_TABLET | Freq: Once | ORAL | Status: AC
  Administered 2018-12-23: 10:00:00 650 mg via ORAL
  Filled 2018-12-23: qty 2

## 2018-12-23 MED ORDER — DIPHENHYDRAMINE HCL 25 MG PO CAPS
50.0000 mg | ORAL_CAPSULE | Freq: Once | ORAL | Status: AC
  Administered 2018-12-23: 50 mg via ORAL
  Filled 2018-12-23: qty 2

## 2018-12-23 MED ORDER — SODIUM CHLORIDE 0.9% FLUSH
10.0000 mL | INTRAVENOUS | Status: DC | PRN
  Administered 2018-12-23: 09:00:00 10 mL
  Filled 2018-12-23: qty 10

## 2018-12-23 NOTE — Progress Notes (Signed)
[place], [place] [address]   CLINIC:  Medical Oncology/Hematology  PCP:  [facility] [address] [place] [address] [phone]   REASON FOR VISIT:  Follow-up for HER-2 positive breast cancer with ongoing Herceptin.   BRIEF ONCOLOGIC HISTORY:  Oncology History  Breast cancer of upper-outer quadrant of left female breast ([facility])  06/22/2017 Initial Diagnosis   Breast cancer of upper-outer quadrant of left female breast ([facility])   06/22/2017 Cancer Staging   Staging form: Breast, AJCC 8th Edition - Clinical stage from 06/22/2017: Stage IIIB (cT3, cN1, cM0, G3, ER+, PR-, HER2-) - Signed by Derek Jack, MD on 06/22/2017   06/29/2017 Imaging   CT chest showing left axillary adenopathy, 1.2 cm anterior carinal adenopathy, left subpectoral lymph node subcentimeter  06/25/2017 2D echocardiogram with ejection fraction of 55-60%   07/16/2017 - 12/12/2017 Chemotherapy   The patient had DOXOrubicin (ADRIAMYCIN) chemo injection 110 mg, 60 mg/m2 = 110 mg, Intravenous,  Once, 4 of 4 cycles Administration: 110 mg (07/18/2017), 110 mg (08/01/2017), 110 mg (08/20/2017), 110 mg (09/03/2017) palonosetron (ALOXI) injection 0.25 mg, 0.25 mg, Intravenous,  Once, 4 of 4 cycles Administration: 0.25 mg (07/18/2017), 0.25 mg (08/01/2017), 0.25 mg (08/20/2017), 0.25 mg (09/03/2017) pegfilgrastim-cbqv (UDENYCA) injection 6 mg, 6 mg, Subcutaneous, Once, 4 of 4 cycles Administration: 6 mg (07/19/2017), 6 mg (08/02/2017), 6 mg (08/22/2017), 6 mg (09/05/2017) cyclophosphamide (CYTOXAN) 1,100 mg in sodium chloride 0.9 % 250 mL chemo infusion, 600 mg/m2 = 1,100 mg, Intravenous,  Once, 4 of 4 cycles Administration: 1,100 mg (07/18/2017), 1,100 mg (08/01/2017), 1,100 mg (08/20/2017), 1,100 mg (09/03/2017) PACLitaxel (TAXOL) 120 mg in sodium chloride 0.9 % 250 mL chemo infusion (</= 2m/m2), 64 mg/m2 = 120 mg (80 % of original dose 80 mg/m2), Intravenous,  Once, 12 of 12  cycles Dose modification: 64 mg/m2 (80 % of original dose 80 mg/m2, Cycle 5, Reason: Provider Judgment), 60 mg/m2 (75 % of original dose 80 mg/m2, Cycle 15, Reason: Other (see comments)), 40 mg/m2 (50 % of original dose 80 mg/m2, Cycle 16, Reason: Other (see comments)) Administration: 120 mg (09/17/2017), 144 mg (09/24/2017), 144 mg (10/03/2017), 144 mg (10/11/2017), 144 mg (10/18/2017), 144 mg (10/25/2017), 144 mg (11/01/2017), 144 mg (11/08/2017), 144 mg (11/15/2017), 144 mg (11/22/2017), 108 mg (11/29/2017), 72 mg (12/06/2017) fosaprepitant (EMEND) 150 mg, dexamethasone (DECADRON) 12 mg in sodium chloride 0.9 % 145 mL IVPB, , Intravenous,  Once, 4 of 4 cycles Administration:  (07/18/2017),  (08/01/2017),  (08/20/2017),  (09/03/2017)  for chemotherapy treatment.    02/07/2018 -  Chemotherapy   The patient had trastuzumab (HERCEPTIN) 600 mg in sodium chloride 0.9 % 250 mL chemo infusion, 567 mg, Intravenous,  Once, 15 of 15 cycles Administration: 600 mg (02/07/2018), 450 mg (02/27/2018), 450 mg (05/01/2018), 450 mg (05/22/2018), 450 mg (06/13/2018), 450 mg (07/03/2018), 450 mg (07/24/2018), 450 mg (08/15/2018), 450 mg (09/06/2018), 450 mg (03/20/2018), 450 mg (09/25/2018), 450 mg (10/16/2018), 450 mg (11/06/2018), 450 mg (11/27/2018), 450 mg (04/10/2018) trastuzumab-dkst (OGIVRI) 450 mg in sodium chloride 0.9 % 250 mL chemo infusion, 420 mg (100 % of original dose 6 mg/kg), Intravenous,  Once, 1 of 3 cycles Dose modification: 6 mg/kg (original dose 6 mg/kg, Cycle 16, Reason: Other (see comments), Comment: change to biosimilar) Administration: 450 mg (12/23/2018)  for chemotherapy treatment.       CANCER STAGING: Cancer Staging Breast cancer of upper-outer quadrant of left female breast ([facility] Staging form: Breast, AJCC 8th Edition - Clinical  stage from 06/22/2017: Stage IIIB (cT3, cN1, cM0, G3, ER+, PR-, HER2-) - Signed by Derek Jack, MD on 06/22/2017    INTERVAL HISTORY:  Lisa Thornton 63 y.o. female seen for follow-up of  breast cancer.  She is receiving Herceptin every 3 weeks.  She denies any diarrhea.  Appetite and energy levels are 75%.  Denies any new onset pains.  She is tolerating anastrozole reasonably well.  She does have occasional hot flashes.  Denies any musculoskeletal symptoms.  Neuropathy in fingertips with numbness has been stable.     REVIEW OF SYSTEMS:  Review of Systems  Neurological: Positive for numbness.  All other systems reviewed and are negative.    PAST MEDICAL/SURGICAL HISTORY:  Past Medical History:  Diagnosis Date  . Cancer [facility])    left breast cancer  . Chest pain   . Hypertension    Past Surgical History:  Procedure Laterality Date  . MASTECTOMY MODIFIED RADICAL Left 12/31/2017   Procedure: LEFT MODIFIED RADICAL MASTECTOMY;  Surgeon: Aviva Signs, MD;  Location: [facility];  Service: General;  Laterality: Left;  . PORTACATH PLACEMENT Right 06/27/2017   Procedure: INSERTION PORT-A-CATH;  Surgeon: Aviva Signs, MD;  Location: [facility];  Service: General;  Laterality: Right;     SOCIAL HISTORY:  Social History   Socioeconomic History  . Marital status: Divorced    Spouse name: Not on file  . Number of children: Not on file  . Years of education: Not on file  . Highest education level: Not on file  Occupational History  . Not on file  Social Needs  . Financial resource strain: Somewhat hard  . Food insecurity    Worry: Never true    Inability: Never true  . Transportation needs    Medical: No    Non-medical: No  Tobacco Use  . Smoking status: Never Smoker  . Smokeless tobacco: Never Used  Substance and Sexual Activity  . Alcohol use: No  . Drug use: No  . Sexual activity: Not Currently  Lifestyle  . Physical activity    Days per week: 3 days    Minutes per session: 10 min  . Stress: Very much  Relationships  . Social connections    Talks on phone: More than three times a week    Gets together: More than three times a week    Attends religious  service: More than 4 times per year    Active member of club or organization: Yes    Attends meetings of clubs or organizations: Never    Relationship status: Divorced  . Intimate partner violence    Fear of current or ex partner: Patient refused    Emotionally abused: Patient refused    Physically abused: Patient refused    Forced sexual activity: Patient refused  Other Topics Concern  . Not on file  Social History Narrative  . Not on file    FAMILY HISTORY:  Family History  Problem Relation Age of Onset  . Breast cancer Mother   . Thyroid disease Mother   . Heart disease Mother   . Heart attack Father   . Heart attack Sister   . Hypertension Sister   . Heart attack Brother   . Cancer Sister   . Stroke Brother   . Alzheimer's disease Maternal Aunt     CURRENT MEDICATIONS:  Outpatient Encounter Medications as of 12/23/2018  Medication Sig  . amLODipine (NORVASC) 5 MG tablet Take 1 tablet (5 mg total) by mouth daily.  Marland Kitchen  anastrozole (ARIMIDEX) 1 MG tablet Take 1 tablet by mouth once daily  . metoprolol tartrate (LOPRESSOR) 50 MG tablet Take 50 mg by mouth 2 (two) times daily.  . Trastuzumab (HERCEPTIN IV) Inject into the vein.  Marland Kitchen HYDROcodone-acetaminophen (NORCO) 5-325 MG tablet Take 1 tablet by mouth every 4 (four) hours as needed for moderate pain. (Patient not taking: Reported on 12/23/2018)  . ondansetron (ZOFRAN) 8 MG tablet Take 1 tablet (8 mg total) by mouth 2 (two) times daily as needed. Start on the third day after chemotherapy. (Patient not taking: Reported on 12/23/2018)  . prochlorperazine (COMPAZINE) 10 MG tablet Take 1 tablet (10 mg total) by mouth every 6 (six) hours as needed (Nausea or vomiting). (Patient not taking: Reported on 12/23/2018)   No facility-administered encounter medications on file as of 12/23/2018.     ALLERGIES:  No Known Allergies   PHYSICAL EXAM:  ECOG Performance status: 1  There were no vitals filed for this visit. There were no  vitals filed for this visit.  Physical Exam Vitals signs reviewed.  Constitutional:      Appearance: Normal appearance.  Cardiovascular:     Rate and Rhythm: Normal rate and regular rhythm.     Heart sounds: Normal heart sounds.  Pulmonary:     Effort: Pulmonary effort is normal.     Breath sounds: Normal breath sounds.  Abdominal:     General: There is no distension.     Palpations: Abdomen is soft.     Tenderness: There is no abdominal tenderness.  Musculoskeletal:        General: No swelling.  Skin:    General: Skin is warm.  Neurological:     General: No focal deficit present.     Mental Status: She is alert and oriented to person, place, and time.  Psychiatric:        Mood and Affect: Mood normal.   Left mastectomy site is within normal limits.  Right breast has no palpable masses.   LABORATORY DATA:  I have reviewed the labs as listed.  CBC    Component Value Date/Time   WBC 6.9 12/23/2018 0812   RBC 3.75 (L) 12/23/2018 0812   HGB 12.0 12/23/2018 0812   HCT 35.4 (L) 12/23/2018 0812   PLT 207 12/23/2018 0812   MCV 94.4 12/23/2018 0812   MCH 32.0 12/23/2018 0812   MCHC 33.9 12/23/2018 0812   RDW 12.4 12/23/2018 0812   LYMPHSABS 1.9 12/23/2018 0812   MONOABS 0.4 12/23/2018 0812   EOSABS 0.2 12/23/2018 0812   BASOSABS 0.0 12/23/2018 0812   CMP Latest Ref Rng & Units 12/23/2018 11/27/2018 11/06/2018  Glucose 70 - 99 mg/dL 229(H) 190(H) 214(H)  BUN 8 - 23 mg/dL _0 Creatinine 0.44 - 1.00 mg/dL 0.85 0.93 0.95  Sodium 135 - 145 mmol/L 135 136 136  Potassium 3.5 - 5.1 mmol/L 4.5 4.6 3.7  Chloride 98 - 111 mmol/L 102 104 101  CO2 22 - 32 mmol/L _1 Calcium 8.9 - 10.3 mg/dL 9.4 9.6 9.3  Total Protein 6.5 - 8.1 g/dL 7.2 7.5 7.5  Total Bilirubin 0.3 - 1.2 mg/dL 0.4 0.6 0.2(L)  Alkaline Phos 38 - 126 U/L 47 53 54  AST 15 - 41 U/L 39 31 34  ALT 0 - 44 U/L 34 29 34       DIAGNOSTIC IMAGING:  I have independently reviewed the scans and discussed with  the patient.   I have reviewed Caryl Pina  Darnelle Maffucci LPN's note and agree with the documentation.  I personally performed a face-to-face visit, made revisions and my assessment and plan is as follows.    ASSESSMENT & PLAN:   Breast cancer of upper-outer quadrant of left female breast ([facility]) 1.  Poorly differentiated left breast cancer stage IIIb, (cT3cN1), ER positive, PR and HER-2 positive: - 4 cycles of neoadjuvant dose dense AC from 4 72,018 through 09/03/2017. -12 cycles of paclitaxel from 09/17/2017 through 12/06/2017.  - Left modified radical mastectomy on 12/31/2017.  Pathology showed 2.3 cm poorly differentiated IDC with sarcomatoid changes, resection margins negative, high-grade DCIS, no skin involvement, 0 out of 8 lymph nodes positive, negative for lymphovascular/perineural invasion.  Pathological staging is YPT2Y PN 0.  HER-2 was positive by IHC on repeat testing of receptors.  Initial fish testing on biopsy was negative.  ER was negative on mastectomy specimen.  Ki 67 decreased to 30% from 80% on biopsy. - She was recommended 1 year of Herceptin.  Pertuzumab was not added secondary to lymph node negativity. - Herceptin every 3 weeks started on 02/07/2018.  She is tolerating it well. -Echocardiogram on 10/14/2018 shows EF of 60 to 65%. -Anastrozole started on 02/17/2018.  She is tolerating it very well with minor hot flashes. -Right breast mammogram on 05/31/2018 was BI-RADS Category 1.  Left mastectomy site is within normal limits. -She will proceed with Herceptin today.  She will come back in 6 weeks for follow-up.  I plan to repeat echocardiogram prior to next visit.    2.  Osteopenia: -DEXA scan on 03/07/2018 shows T score of -1.8. -She will continue taking calcium and vitamin D twice daily.  I plan to repeat DEXA scan in 2 years.  3.  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  She has mild numbness in the fingertips which is constant.  No medication needed.     Total time spent is 25  minutes with more than 50% of the time spent face-to-face discussing treatment plan, counseling and coordination of care.    Orders placed this encounter:  Orders Placed This Encounter  Procedures  . ECHOCARDIOGRAM COMPLETE      Derek Jack, [place] [phone]

## 2018-12-23 NOTE — Progress Notes (Signed)
Patient seen by Dr. Delton Coombes with lab review and ok to treat today verbal order.   Patient tolerated chemotherapy with no complaints voiced.  Port site clean and dry with no bruising or swelling noted at site.  No blood return noted before and after administration of chemotherapy.  Band aid applied.  Patient left ambulatory with VSS and no s/s of distress noted.

## 2018-12-23 NOTE — Patient Instructions (Addendum)
[facility] at [facility] Discharge Instructions  You were seen today by Dr. Katragadda. He went over your recent lab results. He will see you back in 6 weeks for labs and follow up.   Thank you for choosing  [facility] at [facility] to provide your oncology and hematology care.  To afford each patient quality time with our provider, please arrive at least 15 minutes before your scheduled appointment time.   If you have a lab appointment with the Cancer Center please come in thru the  Main Entrance and check in at the main information desk  You need to re-schedule your appointment should you arrive 10 or more minutes late.  We strive to give you quality time with our providers, and arriving late affects you and other patients whose appointments are after yours.  Also, if you no show three or more times for appointments you may be dismissed from the clinic at the providers discretion.     Again, thank you for choosing [facility].  Our hope is that these requests will decrease the amount of time that you wait before being seen by our physicians.       _____________________________________________________________  Should you have questions after your visit to [facility], please contact our office at [phone] between the hours of 8:00 a.m. and 4:30 p.m.  Voicemails left after 4:00 p.m. will not be returned until the following business day.  For prescription refill requests, have your pharmacy contact our office and allow 72 hours.    Cancer Center Support Programs:   > Cancer Support Group  2nd Tuesday of the month 1pm-2pm, Journey Room    

## 2018-12-23 NOTE — Assessment & Plan Note (Signed)
1.  Poorly differentiated left breast cancer stage IIIb, (cT3cN1), ER positive, PR and HER-2 positive: - 4 cycles of neoadjuvant dose dense AC from 4 72,018 through 09/03/2017. -12 cycles of paclitaxel from 09/17/2017 through 12/06/2017.  - Left modified radical mastectomy on 12/31/2017.  Pathology showed 2.3 cm poorly differentiated IDC with sarcomatoid changes, resection margins negative, high-grade DCIS, no skin involvement, 0 out of 8 lymph nodes positive, negative for lymphovascular/perineural invasion.  Pathological staging is YPT2Y PN 0.  HER-2 was positive by IHC on repeat testing of receptors.  Initial fish testing on biopsy was negative.  ER was negative on mastectomy specimen.  Ki 67 decreased to 30% from 80% on biopsy. - She was recommended 1 year of Herceptin.  Pertuzumab was not added secondary to lymph node negativity. - Herceptin every 3 weeks started on 02/07/2018.  She is tolerating it well. -Echocardiogram on 10/14/2018 shows EF of 60 to 65%. -Anastrozole started on 02/17/2018.  She is tolerating it very well with minor hot flashes. -Right breast mammogram on 05/31/2018 was BI-RADS Category 1.  Left mastectomy site is within normal limits. -She will proceed with Herceptin today.  She will come back in 6 weeks for follow-up.  I plan to repeat echocardiogram prior to next visit.    2.  Osteopenia: -DEXA scan on 03/07/2018 shows T score of -1.8. -She will continue taking calcium and vitamin D twice daily.  I plan to repeat DEXA scan in 2 years.  3.  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  She has mild numbness in the fingertips which is constant.  No medication needed.

## 2019-01-13 ENCOUNTER — Inpatient Hospital Stay (HOSPITAL_COMMUNITY): Payer: Medicaid Other | Attending: Hematology

## 2019-01-13 ENCOUNTER — Encounter (HOSPITAL_COMMUNITY): Payer: Self-pay

## 2019-01-13 ENCOUNTER — Inpatient Hospital Stay (HOSPITAL_COMMUNITY): Payer: Medicaid Other

## 2019-01-13 ENCOUNTER — Other Ambulatory Visit: Payer: Self-pay

## 2019-01-13 VITALS — BP 150/75 | HR 66 | Temp 98.2°F | Resp 18 | Wt 169.2 lb

## 2019-01-13 DIAGNOSIS — C50412 Malignant neoplasm of upper-outer quadrant of left female breast: Secondary | ICD-10-CM | POA: Insufficient documentation

## 2019-01-13 DIAGNOSIS — Z5112 Encounter for antineoplastic immunotherapy: Secondary | ICD-10-CM | POA: Diagnosis present

## 2019-01-13 DIAGNOSIS — Z170 Estrogen receptor positive status [ER+]: Secondary | ICD-10-CM | POA: Insufficient documentation

## 2019-01-13 DIAGNOSIS — Z23 Encounter for immunization: Secondary | ICD-10-CM | POA: Insufficient documentation

## 2019-01-13 LAB — COMPREHENSIVE METABOLIC PANEL
ALT: 45 U/L — ABNORMAL HIGH (ref 0–44)
AST: 48 U/L — ABNORMAL HIGH (ref 15–41)
Albumin: 3.8 g/dL (ref 3.5–5.0)
Alkaline Phosphatase: 56 U/L (ref 38–126)
Anion gap: 11 (ref 5–15)
BUN: 16 mg/dL (ref 8–23)
CO2: 22 mmol/L (ref 22–32)
Calcium: 10 mg/dL (ref 8.9–10.3)
Chloride: 102 mmol/L (ref 98–111)
Creatinine, Ser: 0.89 mg/dL (ref 0.44–1.00)
GFR calc Af Amer: >60 mL/min (ref >60)
GFR calc non Af Amer: >60 mL/min (ref >60)
Glucose, Bld: 205 mg/dL — ABNORMAL HIGH (ref 70–99)
Potassium: 4.4 mmol/L (ref 3.5–5.1)
Sodium: 135 mmol/L (ref 135–145)
Total Bilirubin: 0.2 mg/dL — ABNORMAL LOW (ref 0.3–1.2)
Total Protein: 7.7 g/dL (ref 6.5–8.1)

## 2019-01-13 LAB — LACTATE DEHYDROGENASE: LDH: 131 U/L (ref 98–192)

## 2019-01-13 MED ORDER — INFLUENZA VAC SPLIT QUAD 0.5 ML IM SUSY
0.5000 mL | PREFILLED_SYRINGE | Freq: Once | INTRAMUSCULAR | Status: AC
  Administered 2019-01-13: 0.5 mL via INTRAMUSCULAR

## 2019-01-13 MED ORDER — DIPHENHYDRAMINE HCL 25 MG PO CAPS
ORAL_CAPSULE | ORAL | Status: AC
  Filled 2019-01-13: qty 2

## 2019-01-13 MED ORDER — DIPHENHYDRAMINE HCL 25 MG PO CAPS
50.0000 mg | ORAL_CAPSULE | Freq: Once | ORAL | Status: AC
  Administered 2019-01-13: 50 mg via ORAL

## 2019-01-13 MED ORDER — SODIUM CHLORIDE 0.9% FLUSH
10.0000 mL | INTRAVENOUS | Status: DC | PRN
  Administered 2019-01-13 (×2): 10 mL
  Filled 2019-01-13 (×2): qty 10

## 2019-01-13 MED ORDER — SODIUM CHLORIDE 0.9 % IV SOLN
Freq: Once | INTRAVENOUS | Status: AC
  Administered 2019-01-13: 12:00:00 via INTRAVENOUS

## 2019-01-13 MED ORDER — ACETAMINOPHEN 325 MG PO TABS
650.0000 mg | ORAL_TABLET | Freq: Once | ORAL | Status: AC
  Administered 2019-01-13: 650 mg via ORAL

## 2019-01-13 MED ORDER — HEPARIN SOD (PORK) LOCK FLUSH 100 UNIT/ML IV SOLN
500.0000 [IU] | Freq: Once | INTRAVENOUS | Status: AC | PRN
  Administered 2019-01-13: 500 [IU]

## 2019-01-13 MED ORDER — INFLUENZA VAC SPLIT QUAD 0.5 ML IM SUSY
PREFILLED_SYRINGE | INTRAMUSCULAR | Status: AC
  Filled 2019-01-13: qty 0.5

## 2019-01-13 MED ORDER — TRASTUZUMAB-DKST CHEMO 150 MG IV SOLR
450.0000 mg | Freq: Once | INTRAVENOUS | Status: AC
  Administered 2019-01-13: 450 mg via INTRAVENOUS
  Filled 2019-01-13: qty 21.43

## 2019-01-13 MED ORDER — ACETAMINOPHEN 325 MG PO TABS
ORAL_TABLET | ORAL | Status: AC
  Filled 2019-01-13: qty 2

## 2019-01-13 NOTE — Patient Instructions (Signed)
[facility] Discharge Instructions for Patients Receiving Chemotherapy  Today you received the following chemotherapy agents   To help prevent nausea and vomiting after your treatment, we encourage you to take your nausea medication   If you develop nausea and vomiting that is not controlled by your nausea medication, call the clinic.   BELOW ARE SYMPTOMS THAT SHOULD BE REPORTED IMMEDIATELY:  *FEVER GREATER THAN 100.5 F  *CHILLS WITH OR WITHOUT FEVER  NAUSEA AND VOMITING THAT IS NOT CONTROLLED WITH YOUR NAUSEA MEDICATION  *UNUSUAL SHORTNESS OF BREATH  *UNUSUAL BRUISING OR BLEEDING  TENDERNESS IN MOUTH AND THROAT WITH OR WITHOUT PRESENCE OF ULCERS  *URINARY PROBLEMS  *BOWEL PROBLEMS  UNUSUAL RASH Items with * indicate a potential emergency and should be followed up as soon as possible.  Feel free to call the clinic should you have any questions or concerns. The clinic phone number is [phone].  Please show the CHEMO ALERT CARD at check-in to the Emergency Department and triage nurse.   

## 2019-01-13 NOTE — Progress Notes (Signed)
Treatment given today per protocol. 2DECHO scheduled for tomorrow. NO new issues reported by patient today since last treatment.    Patient tolerated it well without problems. Vitals stable and discharged home from clinic ambulatory. Follow up as scheduled.

## 2019-01-14 ENCOUNTER — Other Ambulatory Visit: Payer: Self-pay

## 2019-01-14 ENCOUNTER — Ambulatory Visit (HOSPITAL_COMMUNITY)
Admission: RE | Admit: 2019-01-14 | Discharge: 2019-01-14 | Disposition: A | Discharge status: Home / Self Care | Payer: Medicaid Other | Source: Ambulatory Visit | Attending: Hematology | Admitting: Hematology

## 2019-01-14 DIAGNOSIS — C50412 Malignant neoplasm of upper-outer quadrant of left female breast: Secondary | ICD-10-CM | POA: Diagnosis present

## 2019-01-14 DIAGNOSIS — Z170 Estrogen receptor positive status [ER+]: Secondary | ICD-10-CM | POA: Diagnosis present

## 2019-01-14 NOTE — Progress Notes (Signed)
*  PRELIMINARY RESULTS* Echocardiogram 2D Echocardiogram has been performed.  Samuel Germany 01/14/2019, 11:26 AM

## 2019-02-10 ENCOUNTER — Other Ambulatory Visit: Payer: Self-pay

## 2019-02-10 ENCOUNTER — Inpatient Hospital Stay (HOSPITAL_COMMUNITY): Payer: Medicare Other | Attending: Hematology

## 2019-02-10 ENCOUNTER — Encounter (HOSPITAL_COMMUNITY): Payer: Self-pay | Admitting: Hematology

## 2019-02-10 ENCOUNTER — Inpatient Hospital Stay (HOSPITAL_BASED_OUTPATIENT_CLINIC_OR_DEPARTMENT_OTHER): Payer: Medicare Other | Admitting: Hematology

## 2019-02-10 VITALS — BP 153/71 | HR 66 | Temp 97.9°F | Resp 12 | Wt 167.6 lb

## 2019-02-10 VITALS — BP 148/68 | HR 65 | Temp 97.7°F | Resp 18

## 2019-02-10 DIAGNOSIS — Z1239 Encounter for other screening for malignant neoplasm of breast: Secondary | ICD-10-CM | POA: Diagnosis not present

## 2019-02-10 DIAGNOSIS — M8580 Other specified disorders of bone density and structure, unspecified site: Secondary | ICD-10-CM | POA: Insufficient documentation

## 2019-02-10 DIAGNOSIS — I10 Essential (primary) hypertension: Secondary | ICD-10-CM | POA: Insufficient documentation

## 2019-02-10 DIAGNOSIS — Z79899 Other long term (current) drug therapy: Secondary | ICD-10-CM | POA: Insufficient documentation

## 2019-02-10 DIAGNOSIS — C50412 Malignant neoplasm of upper-outer quadrant of left female breast: Secondary | ICD-10-CM | POA: Insufficient documentation

## 2019-02-10 DIAGNOSIS — R42 Dizziness and giddiness: Secondary | ICD-10-CM | POA: Insufficient documentation

## 2019-02-10 DIAGNOSIS — R200 Anesthesia of skin: Secondary | ICD-10-CM | POA: Diagnosis not present

## 2019-02-10 DIAGNOSIS — R5383 Other fatigue: Secondary | ICD-10-CM | POA: Diagnosis not present

## 2019-02-10 DIAGNOSIS — Z5112 Encounter for antineoplastic immunotherapy: Secondary | ICD-10-CM | POA: Insufficient documentation

## 2019-02-10 DIAGNOSIS — Z170 Estrogen receptor positive status [ER+]: Secondary | ICD-10-CM | POA: Insufficient documentation

## 2019-02-10 DIAGNOSIS — G620 Drug-induced polyneuropathy: Secondary | ICD-10-CM | POA: Insufficient documentation

## 2019-02-10 DIAGNOSIS — Z1231 Encounter for screening mammogram for malignant neoplasm of breast: Secondary | ICD-10-CM

## 2019-02-10 DIAGNOSIS — Z79811 Long term (current) use of aromatase inhibitors: Secondary | ICD-10-CM | POA: Insufficient documentation

## 2019-02-10 LAB — CBC WITH DIFFERENTIAL/PLATELET
Abs Immature Granulocytes: 0.03 10*3/uL (ref 0.00–0.07)
Basophils Absolute: 0 10*3/uL (ref 0.0–0.1)
Basophils Relative: 1 %
Eosinophils Absolute: 0.2 10*3/uL (ref 0.0–0.5)
Eosinophils Relative: 2 %
HCT: 36 % (ref 36.0–46.0)
Hemoglobin: 12.4 g/dL (ref 12.0–15.0)
Immature Granulocytes: 1 %
Lymphocytes Relative: 25 %
Lymphs Abs: 1.6 10*3/uL (ref 0.7–4.0)
MCH: 32.4 pg (ref 26.0–34.0)
MCHC: 34.4 g/dL (ref 30.0–36.0)
MCV: 94 fL (ref 80.0–100.0)
Monocytes Absolute: 0.4 10*3/uL (ref 0.1–1.0)
Monocytes Relative: 7 %
Neutro Abs: 4.2 10*3/uL (ref 1.7–7.7)
Neutrophils Relative %: 64 %
Platelets: 205 10*3/uL (ref 150–400)
RBC: 3.83 MIL/uL — ABNORMAL LOW (ref 3.87–5.11)
RDW: 12.1 % (ref 11.5–15.5)
WBC: 6.4 10*3/uL (ref 4.0–10.5)
nRBC: 0 % (ref 0.0–0.2)

## 2019-02-10 LAB — COMPREHENSIVE METABOLIC PANEL
ALT: 31 U/L (ref 0–44)
AST: 38 U/L (ref 15–41)
Albumin: 3.7 g/dL (ref 3.5–5.0)
Alkaline Phosphatase: 56 U/L (ref 38–126)
Anion gap: 8 (ref 5–15)
BUN: 18 mg/dL (ref 8–23)
CO2: 23 mmol/L (ref 22–32)
Calcium: 9.2 mg/dL (ref 8.9–10.3)
Chloride: 103 mmol/L (ref 98–111)
Creatinine, Ser: 0.91 mg/dL (ref 0.44–1.00)
GFR calc Af Amer: >60 mL/min (ref >60)
GFR calc non Af Amer: >60 mL/min (ref >60)
Glucose, Bld: 204 mg/dL — ABNORMAL HIGH (ref 70–99)
Potassium: 4 mmol/L (ref 3.5–5.1)
Sodium: 134 mmol/L — ABNORMAL LOW (ref 135–145)
Total Bilirubin: 0.5 mg/dL (ref 0.3–1.2)
Total Protein: 7.7 g/dL (ref 6.5–8.1)

## 2019-02-10 LAB — LACTATE DEHYDROGENASE: LDH: 129 U/L (ref 98–192)

## 2019-02-10 MED ORDER — HEPARIN SOD (PORK) LOCK FLUSH 100 UNIT/ML IV SOLN
500.0000 [IU] | Freq: Once | INTRAVENOUS | Status: AC | PRN
  Administered 2019-02-10: 500 [IU]

## 2019-02-10 MED ORDER — ACETAMINOPHEN 325 MG PO TABS
ORAL_TABLET | ORAL | Status: AC
  Filled 2019-02-10: qty 2

## 2019-02-10 MED ORDER — DIPHENHYDRAMINE HCL 25 MG PO CAPS
50.0000 mg | ORAL_CAPSULE | Freq: Once | ORAL | Status: AC
  Administered 2019-02-10: 50 mg via ORAL

## 2019-02-10 MED ORDER — ACETAMINOPHEN 325 MG PO TABS
650.0000 mg | ORAL_TABLET | Freq: Once | ORAL | Status: AC
  Administered 2019-02-10: 650 mg via ORAL

## 2019-02-10 MED ORDER — SODIUM CHLORIDE 0.9 % IV SOLN
Freq: Once | INTRAVENOUS | Status: AC
  Administered 2019-02-10: 12:00:00 via INTRAVENOUS

## 2019-02-10 MED ORDER — SODIUM CHLORIDE 0.9% FLUSH
10.0000 mL | INTRAVENOUS | Status: DC | PRN
  Administered 2019-02-10 (×2): 10 mL
  Filled 2019-02-10 (×2): qty 10

## 2019-02-10 MED ORDER — TRASTUZUMAB-DKST CHEMO 150 MG IV SOLR
450.0000 mg | Freq: Once | INTRAVENOUS | Status: AC
  Administered 2019-02-10: 450 mg via INTRAVENOUS
  Filled 2019-02-10: qty 21.43

## 2019-02-10 MED ORDER — DIPHENHYDRAMINE HCL 25 MG PO CAPS
ORAL_CAPSULE | ORAL | Status: AC
  Filled 2019-02-10: qty 2

## 2019-02-10 NOTE — Progress Notes (Signed)
Labs reviewed with MD today at office visit, ok to treat per verbal order from Dr. Delton Coombes.   Treatment given per orders. Patient tolerated it well without problems. Vitals stable and discharged home from clinic ambulatory. Follow up as scheduled.

## 2019-02-10 NOTE — Patient Instructions (Addendum)
[place] at [facility] Discharge Instructions  You were seen today by Dr. Delton Coombes. He went over your recent lab results. He will see you back in 3 weeks for labs and your last treatment. He will see you back in 2 months for labs and follow up.   Thank you for choosing [facility] to provide your oncology and hematology care.  To afford each patient quality time with our provider, please arrive at least 15 minutes before your scheduled appointment time.   If you have a lab appointment with the [facility] please come in thru the  Main Entrance and check in at the main information desk  You need to re-schedule your appointment should you arrive 10 or more minutes late.  We strive to give you quality time with our providers, and arriving late affects you and other patients whose appointments are after yours.  Also, if you no show three or more times for appointments you may be dismissed from the clinic at the providers discretion.     Again, thank you for choosing [facility].  Our hope is that these requests will decrease the amount of time that you wait before being seen by our physicians.       _____________________________________________________________  Should you have questions after your visit to [place], please contact our office at [phone] between the hours of 8:00 a.m. and 4:30 p.m.  Voicemails left after 4:00 p.m. will not be returned until the following business day.  For prescription refill requests, have your pharmacy contact our office and allow 72 hours.    Cancer Center Support Programs:   > Cancer Support Group  2nd Tuesday of the month 1pm-2pm, Journey Room

## 2019-02-10 NOTE — Progress Notes (Signed)
[place], [place] [address]   CLINIC:  Medical Oncology/Hematology  PCP:  Celene Squibb, MD [place] [address] [phone]   REASON FOR VISIT:  Follow-up for HER-2 positive breast cancer with ongoing Herceptin.   BRIEF ONCOLOGIC HISTORY:  Oncology History  Breast cancer of upper-outer quadrant of left female breast ([place])  06/22/2017 Initial Diagnosis   Breast cancer of upper-outer quadrant of left female breast ([place])   06/22/2017 Cancer Staging   Staging form: Breast, AJCC 8th Edition - Clinical stage from 06/22/2017: Stage IIIB (cT3, cN1, cM0, G3, ER+, PR-, HER2-) - Signed by Derek Jack, MD on 06/22/2017   06/29/2017 Imaging   CT chest showing left axillary adenopathy, 1.2 cm anterior carinal adenopathy, left subpectoral lymph node subcentimeter  06/25/2017 2D echocardiogram with ejection fraction of 55-60%   07/16/2017 - 12/12/2017 Chemotherapy   The patient had DOXOrubicin (ADRIAMYCIN) chemo injection 110 mg, 60 mg/m2 = 110 mg, Intravenous,  Once, 4 of 4 cycles Administration: 110 mg (07/18/2017), 110 mg (08/01/2017), 110 mg (08/20/2017), 110 mg (09/03/2017) palonosetron (ALOXI) injection 0.25 mg, 0.25 mg, Intravenous,  Once, 4 of 4 cycles Administration: 0.25 mg (07/18/2017), 0.25 mg (08/01/2017), 0.25 mg (08/20/2017), 0.25 mg (09/03/2017) pegfilgrastim-cbqv (UDENYCA) injection 6 mg, 6 mg, Subcutaneous, Once, 4 of 4 cycles Administration: 6 mg (07/19/2017), 6 mg (08/02/2017), 6 mg (08/22/2017), 6 mg (09/05/2017) cyclophosphamide (CYTOXAN) 1,100 mg in sodium chloride 0.9 % 250 mL chemo infusion, 600 mg/m2 = 1,100 mg, Intravenous,  Once, 4 of 4 cycles Administration: 1,100 mg (07/18/2017), 1,100 mg (08/01/2017), 1,100 mg (08/20/2017), 1,100 mg (09/03/2017) PACLitaxel (TAXOL) 120 mg in sodium chloride 0.9 % 250 mL chemo infusion (</= '80mg'$ /m2), 64 mg/m2 = 120 mg (80 % of original dose 80 mg/m2), Intravenous,  Once, 12 of 12 cycles Dose  modification: 64 mg/m2 (80 % of original dose 80 mg/m2, Cycle 5, Reason: Provider Judgment), 60 mg/m2 (75 % of original dose 80 mg/m2, Cycle 15, Reason: Other (see comments)), 40 mg/m2 (50 % of original dose 80 mg/m2, Cycle 16, Reason: Other (see comments)) Administration: 120 mg (09/17/2017), 144 mg (09/24/2017), 144 mg (10/03/2017), 144 mg (10/11/2017), 144 mg (10/18/2017), 144 mg (10/25/2017), 144 mg (11/01/2017), 144 mg (11/08/2017), 144 mg (11/15/2017), 144 mg (11/22/2017), 108 mg (11/29/2017), 72 mg (12/06/2017) fosaprepitant (EMEND) 150 mg, dexamethasone (DECADRON) 12 mg in sodium chloride 0.9 % 145 mL IVPB, , Intravenous,  Once, 4 of 4 cycles Administration:  (07/18/2017),  (08/01/2017),  (08/20/2017),  (09/03/2017)  for chemotherapy treatment.    02/07/2018 -  Chemotherapy   The patient had trastuzumab (HERCEPTIN) 600 mg in sodium chloride 0.9 % 250 mL chemo infusion, 567 mg, Intravenous,  Once, 15 of 15 cycles Administration: 600 mg (02/07/2018), 450 mg (02/27/2018), 450 mg (05/01/2018), 450 mg (05/22/2018), 450 mg (06/13/2018), 450 mg (07/03/2018), 450 mg (07/24/2018), 450 mg (08/15/2018), 450 mg (09/06/2018), 450 mg (03/20/2018), 450 mg (09/25/2018), 450 mg (10/16/2018), 450 mg (11/06/2018), 450 mg (11/27/2018), 450 mg (04/10/2018) trastuzumab-dkst (OGIVRI) 450 mg in sodium chloride 0.9 % 250 mL chemo infusion, 420 mg (100 % of original dose 6 mg/kg), Intravenous,  Once, 4 of 4 cycles Dose modification: 6 mg/kg (original dose 6 mg/kg, Cycle 16, Reason: Other (see comments), Comment: change to biosimilar) Administration: 450 mg (12/23/2018), 450 mg (01/13/2019), 450 mg (02/10/2019), 450 mg (03/04/2019)  for chemotherapy treatment.       CANCER STAGING: Cancer Staging Breast cancer of upper-outer quadrant of left female  breast [facility]) Staging form: Breast, AJCC 8th Edition - Clinical stage from 06/22/2017: Stage IIIB (cT3, cN1, cM0, G3, ER+, PR-, HER2-) - Signed by Derek Jack, MD on 06/22/2017    INTERVAL HISTORY:   Lisa Thornton 63 y.o. female seen for follow-up of breast cancer.  She is receiving Herceptin and tolerating it very well every 3 weeks.  Reports dizziness at times.  She is taking anastrozole without any problems.  Appetite is 50%.  Energy levels are 25%.  Mild fatigue is reported.  Numbness has gotten better.  No signs or symptoms of PND or orthopnea.    REVIEW OF SYSTEMS:  Review of Systems  Neurological: Positive for dizziness.  All other systems reviewed and are negative.    PAST MEDICAL/SURGICAL HISTORY:  Past Medical History:  Diagnosis Date  . Cancer [facility])    left breast cancer  . Chest pain   . Hypertension    Past Surgical History:  Procedure Laterality Date  . MASTECTOMY MODIFIED RADICAL Left 12/31/2017   Procedure: LEFT MODIFIED RADICAL MASTECTOMY;  Surgeon: Aviva Signs, MD;  Location: [facility];  Service: General;  Laterality: Left;  . PORTACATH PLACEMENT Right 06/27/2017   Procedure: INSERTION PORT-A-CATH;  Surgeon: Aviva Signs, MD;  Location: [facility];  Service: General;  Laterality: Right;     SOCIAL HISTORY:  Social History   Socioeconomic History  . Marital status: Divorced    Spouse name: Not on file  . Number of children: Not on file  . Years of education: Not on file  . Highest education level: Not on file  Occupational History  . Not on file  Social Needs  . Financial resource strain: Somewhat hard  . Food insecurity    Worry: Never true    Inability: Never true  . Transportation needs    Medical: No    Non-medical: No  Tobacco Use  . Smoking status: Never Smoker  . Smokeless tobacco: Never Used  Substance and Sexual Activity  . Alcohol use: No  . Drug use: No  . Sexual activity: Not Currently  Lifestyle  . Physical activity    Days per week: 3 days    Minutes per session: 10 min  . Stress: Very much  Relationships  . Social connections    Talks on phone: More than three times a week    Gets together: More than three times a week    Attends  religious service: More than 4 times per year    Active member of club or organization: Yes    Attends meetings of clubs or organizations: Never    Relationship status: Divorced  . Intimate partner violence    Fear of current or ex partner: Patient refused    Emotionally abused: Patient refused    Physically abused: Patient refused    Forced sexual activity: Patient refused  Other Topics Concern  . Not on file  Social History Narrative  . Not on file    FAMILY HISTORY:  Family History  Problem Relation Age of Onset  . Breast cancer Mother   . Thyroid disease Mother   . Heart disease Mother   . Heart attack Father   . Heart attack Sister   . Hypertension Sister   . Heart attack Brother   . Cancer Sister   . Stroke Brother   . Alzheimer's disease Maternal Aunt     CURRENT MEDICATIONS:  Outpatient Encounter Medications as of 02/10/2019  Medication Sig  . amLODipine (NORVASC) 5 MG tablet  Take 1 tablet (5 mg total) by mouth daily.  Marland Kitchen anastrozole (ARIMIDEX) 1 MG tablet Take 1 tablet by mouth once daily  . metoprolol tartrate (LOPRESSOR) 50 MG tablet Take 50 mg by mouth 2 (two) times daily.  . Trastuzumab (HERCEPTIN IV) Inject into the vein.  . [DISCONTINUED] HYDROcodone-acetaminophen (NORCO) 5-325 MG tablet Take 1 tablet by mouth every 4 (four) hours as needed for moderate pain. (Patient not taking: Reported on 12/23/2018)  . [DISCONTINUED] ondansetron (ZOFRAN) 8 MG tablet Take 1 tablet (8 mg total) by mouth 2 (two) times daily as needed. Start on the third day after chemotherapy. (Patient not taking: Reported on 12/23/2018)  . [DISCONTINUED] prochlorperazine (COMPAZINE) 10 MG tablet Take 1 tablet (10 mg total) by mouth every 6 (six) hours as needed (Nausea or vomiting). (Patient not taking: Reported on 12/23/2018)   No facility-administered encounter medications on file as of 02/10/2019.     ALLERGIES:  No Known Allergies   PHYSICAL EXAM:  ECOG Performance status: 1  Vitals:    02/10/19 1005  BP: (!) 153/71  Pulse: 66  Resp: 12  Temp: 97.9 F (36.6 C)  SpO2: 99%   Filed Weights   02/10/19 1005  Weight: 167 lb 9.6 oz (76 kg)    Physical Exam Vitals signs reviewed.  Constitutional:      Appearance: Normal appearance.  Cardiovascular:     Rate and Rhythm: Normal rate and regular rhythm.     Heart sounds: Normal heart sounds.  Pulmonary:     Effort: Pulmonary effort is normal.     Breath sounds: Normal breath sounds.  Abdominal:     General: There is no distension.     Palpations: Abdomen is soft.     Tenderness: There is no abdominal tenderness.  Musculoskeletal:        General: No swelling.  Skin:    General: Skin is warm.  Neurological:     General: No focal deficit present.     Mental Status: She is alert and oriented to person, place, and time.  Psychiatric:        Mood and Affect: Mood normal.   Left mastectomy site is within normal limits.  Right breast has no palpable masses.   LABORATORY DATA:  I have reviewed the labs as listed.  CBC    Component Value Date/Time   WBC 6.8 03/04/2019 0935   RBC 3.85 (L) 03/04/2019 0935   HGB 12.2 03/04/2019 0935   HCT 36.3 03/04/2019 0935   PLT 194 03/04/2019 0935   MCV 94.3 03/04/2019 0935   MCH 31.7 03/04/2019 0935   MCHC 33.6 03/04/2019 0935   RDW 12.2 03/04/2019 0935   LYMPHSABS 2.1 03/04/2019 0935   MONOABS 0.4 03/04/2019 0935   EOSABS 0.3 03/04/2019 0935   BASOSABS 0.0 03/04/2019 0935   CMP Latest Ref Rng & Units 03/04/2019 02/10/2019 01/13/2019  Glucose 70 - 99 mg/dL 205(H) 204(H) 205(H)  BUN 8 - 23 mg/dL '12 18 16  '$ Creatinine 0.44 - 1.00 mg/dL 0.92 0.91 0.89  Sodium 135 - 145 mmol/L 136 134(L) 135  Potassium 3.5 - 5.1 mmol/L 4.5 4.0 4.4  Chloride 98 - 111 mmol/L 104 103 102  CO2 22 - 32 mmol/L '24 23 22  '$ Calcium 8.9 - 10.3 mg/dL 9.6 9.2 10.0  Total Protein 6.5 - 8.1 g/dL 7.6 7.7 7.7  Total Bilirubin 0.3 - 1.2 mg/dL 0.5 0.5 0.2(L)  Alkaline Phos 38 - 126 U/L 54 56 56  AST 15 -  41  U/L 44(H) 38 48(H)  ALT 0 - 44 U/L 36 31 45(H)       DIAGNOSTIC IMAGING:  I have independently reviewed the scans and discussed with the patient.   I have reviewed Venita Lick LPN's note and agree with the documentation.  I personally performed a face-to-face visit, made revisions and my assessment and plan is as follows.    ASSESSMENT & PLAN:   Breast cancer of upper-outer quadrant of left female breast ([place]) 1.  Poorly differentiated left breast cancer stage IIIb, (cT3cN1), ER positive, PR and HER-2 positive: - 4 cycles of neoadjuvant dose dense AC from 4 72,018 through 09/03/2017. -12 cycles of paclitaxel from 09/17/2017 through 12/06/2017.  - Left modified radical mastectomy on 12/31/2017.  Pathology showed 2.3 cm poorly differentiated IDC with sarcomatoid changes, resection margins negative, high-grade DCIS, no skin involvement, 0 out of 8 lymph nodes positive, negative for lymphovascular/perineural invasion.  Pathological staging is YPT2Y PN 0.  HER-2 was positive by IHC on repeat testing of receptors.  Initial fish testing on biopsy was negative.  ER was negative on mastectomy specimen.  Ki 67 decreased to 30% from 80% on biopsy. - She was recommended 1 year of Herceptin.  Pertuzumab was not added secondary to lymph node negativity. - Herceptin every 3 weeks started on 02/07/2018.  She is tolerating it well. -Echocardiogram on 10/14/2018 shows EF of 60 to 65%. -Anastrozole started on 02/17/2018.  She is tolerating it very well with minor hot flashes. -Right breast mammogram on 05/31/2018 was BI-RADS Category 1.  Left mastectomy site is within normal limits. -We have reviewed labs.  She will proceed with her Herceptin today and in 3 weeks.  03/04/2019 will be her last treatment. -We reviewed echo results from 01/04/2019 which showed ejection fraction of 55-60%. -I will see her back in 2 months for follow-up.  We will plan to schedule her mammogram after 06/01/2019.   2.  Osteopenia:  -DEXA scan on 03/07/2018 shows T score of -1.8. -She will continue taking calcium and vitamin D twice daily.  I plan to repeat DEXA scan in 2 years.  3.  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  She has mild numbness in the fingertips which is constant.  No medication needed.         Orders placed this encounter:  Orders Placed This Encounter  Procedures  . MM 3D SCREEN BREAST UNI RIGHT  . CBC with Differential/Platelet  . Comprehensive metabolic panel      Derek Jack, MD [place] [phone]

## 2019-02-10 NOTE — Patient Instructions (Signed)
[facility] Discharge Instructions for Patients Receiving Chemotherapy  Today you received the following chemotherapy agents   To help prevent nausea and vomiting after your treatment, we encourage you to take your nausea medication   If you develop nausea and vomiting that is not controlled by your nausea medication, call the clinic.   BELOW ARE SYMPTOMS THAT SHOULD BE REPORTED IMMEDIATELY:  *FEVER GREATER THAN 100.5 F  *CHILLS WITH OR WITHOUT FEVER  NAUSEA AND VOMITING THAT IS NOT CONTROLLED WITH YOUR NAUSEA MEDICATION  *UNUSUAL SHORTNESS OF BREATH  *UNUSUAL BRUISING OR BLEEDING  TENDERNESS IN MOUTH AND THROAT WITH OR WITHOUT PRESENCE OF ULCERS  *URINARY PROBLEMS  *BOWEL PROBLEMS  UNUSUAL RASH Items with * indicate a potential emergency and should be followed up as soon as possible.  Feel free to call the clinic should you have any questions or concerns. The clinic phone number is [phone].  Please show the CHEMO ALERT CARD at check-in to the Emergency Department and triage nurse.   

## 2019-03-04 ENCOUNTER — Inpatient Hospital Stay (HOSPITAL_COMMUNITY): Payer: Medicare Other | Attending: Hematology

## 2019-03-04 ENCOUNTER — Inpatient Hospital Stay (HOSPITAL_COMMUNITY): Payer: Medicare Other

## 2019-03-04 ENCOUNTER — Other Ambulatory Visit: Payer: Self-pay

## 2019-03-04 ENCOUNTER — Encounter (HOSPITAL_COMMUNITY): Payer: Self-pay

## 2019-03-04 VITALS — BP 135/60 | HR 59 | Temp 96.9°F | Resp 18 | Wt 168.6 lb

## 2019-03-04 DIAGNOSIS — Z170 Estrogen receptor positive status [ER+]: Secondary | ICD-10-CM | POA: Diagnosis not present

## 2019-03-04 DIAGNOSIS — Z5112 Encounter for antineoplastic immunotherapy: Secondary | ICD-10-CM | POA: Insufficient documentation

## 2019-03-04 DIAGNOSIS — C50412 Malignant neoplasm of upper-outer quadrant of left female breast: Secondary | ICD-10-CM

## 2019-03-04 LAB — COMPREHENSIVE METABOLIC PANEL
ALT: 36 U/L (ref 0–44)
AST: 44 U/L — ABNORMAL HIGH (ref 15–41)
Albumin: 3.7 g/dL (ref 3.5–5.0)
Alkaline Phosphatase: 54 U/L (ref 38–126)
Anion gap: 8 (ref 5–15)
BUN: 12 mg/dL (ref 8–23)
CO2: 24 mmol/L (ref 22–32)
Calcium: 9.6 mg/dL (ref 8.9–10.3)
Chloride: 104 mmol/L (ref 98–111)
Creatinine, Ser: 0.92 mg/dL (ref 0.44–1.00)
GFR calc Af Amer: >60 mL/min (ref >60)
GFR calc non Af Amer: >60 mL/min (ref >60)
Glucose, Bld: 205 mg/dL — ABNORMAL HIGH (ref 70–99)
Potassium: 4.5 mmol/L (ref 3.5–5.1)
Sodium: 136 mmol/L (ref 135–145)
Total Bilirubin: 0.5 mg/dL (ref 0.3–1.2)
Total Protein: 7.6 g/dL (ref 6.5–8.1)

## 2019-03-04 LAB — CBC WITH DIFFERENTIAL/PLATELET
Abs Immature Granulocytes: 0.04 10*3/uL (ref 0.00–0.07)
Basophils Absolute: 0 10*3/uL (ref 0.0–0.1)
Basophils Relative: 1 %
Eosinophils Absolute: 0.3 10*3/uL (ref 0.0–0.5)
Eosinophils Relative: 4 %
HCT: 36.3 % (ref 36.0–46.0)
Hemoglobin: 12.2 g/dL (ref 12.0–15.0)
Immature Granulocytes: 1 %
Lymphocytes Relative: 31 %
Lymphs Abs: 2.1 10*3/uL (ref 0.7–4.0)
MCH: 31.7 pg (ref 26.0–34.0)
MCHC: 33.6 g/dL (ref 30.0–36.0)
MCV: 94.3 fL (ref 80.0–100.0)
Monocytes Absolute: 0.4 10*3/uL (ref 0.1–1.0)
Monocytes Relative: 6 %
Neutro Abs: 3.9 10*3/uL (ref 1.7–7.7)
Neutrophils Relative %: 57 %
Platelets: 194 10*3/uL (ref 150–400)
RBC: 3.85 MIL/uL — ABNORMAL LOW (ref 3.87–5.11)
RDW: 12.2 % (ref 11.5–15.5)
WBC: 6.8 10*3/uL (ref 4.0–10.5)
nRBC: 0 % (ref 0.0–0.2)

## 2019-03-04 MED ORDER — HEPARIN SOD (PORK) LOCK FLUSH 100 UNIT/ML IV SOLN
500.0000 [IU] | Freq: Once | INTRAVENOUS | Status: AC | PRN
  Administered 2019-03-04: 500 [IU]

## 2019-03-04 MED ORDER — SODIUM CHLORIDE 0.9% FLUSH
10.0000 mL | INTRAVENOUS | Status: DC | PRN
  Administered 2019-03-04: 10 mL
  Filled 2019-03-04: qty 10

## 2019-03-04 MED ORDER — SODIUM CHLORIDE 0.9 % IV SOLN
Freq: Once | INTRAVENOUS | Status: AC
  Administered 2019-03-04: 11:00:00 via INTRAVENOUS

## 2019-03-04 MED ORDER — DIPHENHYDRAMINE HCL 25 MG PO CAPS
50.0000 mg | ORAL_CAPSULE | Freq: Once | ORAL | Status: AC
  Administered 2019-03-04: 50 mg via ORAL
  Filled 2019-03-04: qty 2

## 2019-03-04 MED ORDER — TRASTUZUMAB-DKST CHEMO 150 MG IV SOLR
450.0000 mg | Freq: Once | INTRAVENOUS | Status: AC
  Administered 2019-03-04: 450 mg via INTRAVENOUS
  Filled 2019-03-04: qty 21.43

## 2019-03-04 MED ORDER — ACETAMINOPHEN 325 MG PO TABS
650.0000 mg | ORAL_TABLET | Freq: Once | ORAL | Status: AC
  Administered 2019-03-04: 650 mg via ORAL
  Filled 2019-03-04: qty 2

## 2019-03-04 NOTE — Patient Instructions (Signed)
[facility] Discharge Instructions for Patients Receiving Chemotherapy  Today you received the following chemotherapy agents   To help prevent nausea and vomiting after your treatment, we encourage you to take your nausea medication   If you develop nausea and vomiting that is not controlled by your nausea medication, call the clinic.   BELOW ARE SYMPTOMS THAT SHOULD BE REPORTED IMMEDIATELY:  *FEVER GREATER THAN 100.5 F  *CHILLS WITH OR WITHOUT FEVER  NAUSEA AND VOMITING THAT IS NOT CONTROLLED WITH YOUR NAUSEA MEDICATION  *UNUSUAL SHORTNESS OF BREATH  *UNUSUAL BRUISING OR BLEEDING  TENDERNESS IN MOUTH AND THROAT WITH OR WITHOUT PRESENCE OF ULCERS  *URINARY PROBLEMS  *BOWEL PROBLEMS  UNUSUAL RASH Items with * indicate a potential emergency and should be followed up as soon as possible.  Feel free to call the clinic should you have any questions or concerns. The clinic phone number is [phone].  Please show the CHEMO ALERT CARD at check-in to the Emergency Department and triage nurse.   

## 2019-03-04 NOTE — Progress Notes (Signed)
Treatment given per orders. Patient tolerated it well without problems. Vitals stable and discharged home from clinic ambulatory. Follow up as scheduled.  

## 2019-03-05 ENCOUNTER — Encounter: Payer: Self-pay | Admitting: Cardiovascular Disease

## 2019-03-05 ENCOUNTER — Telehealth (INDEPENDENT_AMBULATORY_CARE_PROVIDER_SITE_OTHER): Payer: Medicare Other | Admitting: Cardiovascular Disease

## 2019-03-05 VITALS — BP 135/60 | Ht 64.0 in | Wt 186.6 lb

## 2019-03-05 DIAGNOSIS — I10 Essential (primary) hypertension: Secondary | ICD-10-CM

## 2019-03-05 DIAGNOSIS — I480 Paroxysmal atrial fibrillation: Secondary | ICD-10-CM

## 2019-03-05 NOTE — Progress Notes (Signed)
Virtual Visit via Telephone Note   This visit type was conducted due to national recommendations for restrictions regarding the COVID-19 Pandemic (e.g. social distancing) in an effort to limit this patient's exposure and mitigate transmission in our community.  Due to her co-morbid illnesses, this patient is at least at moderate risk for complications without adequate follow up.  This format is felt to be most appropriate for this patient at this time.  The patient did not have access to video technology/had technical difficulties with video requiring transitioning to audio format only (telephone).  All issues noted in this document were discussed and addressed.  No physical exam could be performed with this format.  Please refer to the patient's chart for her  consent to telehealth for [facility].   Date:  03/05/2019   ID:  Lisa Thornton, DOB [birth year], MRN [id number]  Patient Location: Home Provider Location: Office  PCP:  Celene Squibb, MD  Cardiologist:  Kate Sable, MD  Electrophysiologist:  None   Evaluation Performed:  Follow-Up Visit  Chief Complaint:  PAF  History of Present Illness:    Lisa Thornton is a 63 y.o. female with paroxysmal atrial fibrillation.  Past medical history also includes hypertension and poorly differentiated left breast breast cancer for which she underwent left modified radical mastectomy on 12/31/2017. She has been treated with neoadjuvant chemotherapy as well.  The patient denies any symptoms of chest pain, palpitations, shortness of breath, lightheadedness, dizziness, leg swelling, orthopnea, PND, and syncope.  The patient does not have symptoms concerning for COVID-19 infection (fever, chills, cough, or new shortness of breath).   She lives with her sister.   Past Medical History:  Diagnosis Date  . Cancer [facility])    left breast cancer  . Chest pain   . Hypertension    Past Surgical History:  Procedure Laterality Date  . MASTECTOMY  MODIFIED RADICAL Left 12/31/2017   Procedure: LEFT MODIFIED RADICAL MASTECTOMY;  Surgeon: Aviva Signs, MD;  Location: [facility];  Service: General;  Laterality: Left;  . PORTACATH PLACEMENT Right 06/27/2017   Procedure: INSERTION PORT-A-CATH;  Surgeon: Aviva Signs, MD;  Location: [facility];  Service: General;  Laterality: Right;     Current Meds  Medication Sig  . amLODipine (NORVASC) 5 MG tablet Take 1 tablet (5 mg total) by mouth daily.  Marland Kitchen anastrozole (ARIMIDEX) 1 MG tablet Take 1 tablet by mouth once daily  . calcium carbonate (CALCIUM 600) 600 MG TABS tablet Take 600 mg by mouth daily with breakfast.  . cholecalciferol (VITAMIN D3) 25 MCG (1000 UT) tablet Take 1,000 Units by mouth 2 (two) times daily.   . metoprolol tartrate (LOPRESSOR) 50 MG tablet Take 50 mg by mouth 2 (two) times daily.  . Omega-3 Fatty Acids (FISH OIL) 1000 MG CAPS Take 1 capsule by mouth 2 (two) times daily.     Allergies:   Patient has no known allergies.   Social History   Tobacco Use  . Smoking status: Never Smoker  . Smokeless tobacco: Never Used  Substance Use Topics  . Alcohol use: No  . Drug use: No     Family Hx: The patient's family history includes Alzheimer's disease in her maternal aunt; Breast cancer in her mother; Cancer in her sister; Heart attack in her brother, father, and sister; Heart disease in her mother; Hypertension in her sister; Stroke in her brother; Thyroid disease in her mother.  ROS:   Please see the history of present illness.  All other systems reviewed and are negative.   Prior CV studies:   The following studies were reviewed today:  Echocardiogram 01/14/2019:   1. Left ventricular ejection fraction, by visual estimation, is 55 to 60%. The left ventricle has normal function. Normal left ventricular size. There is no left ventricular hypertrophy.  2. No GLS done.  3. Global right ventricle has normal systolic function.The right ventricular size is normal. No increase  in right ventricular wall thickness.  4. Left atrial size was normal.  5. Right atrial size was normal.  6. The mitral valve is normal in structure. Trace mitral valve regurgitation. No evidence of mitral stenosis.  7. The tricuspid valve is normal in structure. Tricuspid valve regurgitation is trivial.  8. The aortic valve is tricuspid Aortic valve regurgitation is trivial by color flow Doppler. Mild aortic valve sclerosis without stenosis.  9. The pulmonic valve was grossly normal. Pulmonic valve regurgitation is trivial by color flow Doppler. 10. Mildly elevated pulmonary artery systolic pressure. 11. The inferior vena cava is normal in size with greater than 50% respiratory variability, suggesting right atrial pressure of 3 mmHg.  Labs/Other Tests and Data Reviewed:    EKG:  No ECG reviewed.  Recent Labs: 03/04/2019: ALT 36; BUN 12; Creatinine, Ser 0.92; Hemoglobin 12.2; Platelets 194; Potassium 4.5; Sodium 136   Recent Lipid Panel No results found for: CHOL, TRIG, HDL, CHOLHDL, LDLCALC, LDLDIRECT  Wt Readings from Last 3 Encounters:  03/05/19 186 lb 9.6 oz (84.6 kg)  03/04/19 168 lb 9.6 oz (76.5 kg)  02/10/19 167 lb 9.6 oz (76 kg)     Objective:    Vital Signs:  BP 135/60   Ht 5\' 4"  (1.626 m)   Wt 186 lb 9.6 oz (84.6 kg)   BMI 32.03 kg/m    VITAL SIGNS:  reviewed  ASSESSMENT & PLAN:    1.  Paroxysmal atrial fibrillation: She is asymptomatic with respect to palpitations, chest pain and shortness of breath.  This appears to have been an isolated instance. CHADSVASC score is 3.  Left atrial size is normal.  I will hold off on anticoagulation unless she were to develop atrial fibrillation recurrence. We talked about this. Continue Lopressor 50 mg twice daily.  2.  Hypertension: Blood pressure is normal.  No changes to therapy.    COVID-19 Education: The signs and symptoms of COVID-19 were discussed with the patient and how to seek care for testing (follow up with PCP or  arrange E-visit).  The importance of social distancing was discussed today.  Time:   Today, I have spent 5 minutes with the patient with telehealth technology discussing the above problems.     Medication Adjustments/Labs and Tests Ordered: Current medicines are reviewed at length with the patient today.  Concerns regarding medicines are outlined above.   Tests Ordered: No orders of the defined types were placed in this encounter.   Medication Changes: No orders of the defined types were placed in this encounter.   Follow Up:  Virtual Visit  in 6 month(s)  Signed, Kate Sable, MD  03/05/2019 1:31 PM    [facility]

## 2019-03-05 NOTE — Patient Instructions (Addendum)

## 2019-03-09 ENCOUNTER — Encounter (HOSPITAL_COMMUNITY): Payer: Self-pay | Admitting: Hematology

## 2019-03-09 NOTE — Assessment & Plan Note (Addendum)
1.  Poorly differentiated left breast cancer stage IIIb, (cT3cN1), ER positive, PR and HER-2 positive: - 4 cycles of neoadjuvant dose dense AC from 4 72,018 through 09/03/2017. -12 cycles of paclitaxel from 09/17/2017 through 12/06/2017.  - Left modified radical mastectomy on 12/31/2017.  Pathology showed 2.3 cm poorly differentiated IDC with sarcomatoid changes, resection margins negative, high-grade DCIS, no skin involvement, 0 out of 8 lymph nodes positive, negative for lymphovascular/perineural invasion.  Pathological staging is YPT2Y PN 0.  HER-2 was positive by IHC on repeat testing of receptors.  Initial fish testing on biopsy was negative.  ER was negative on mastectomy specimen.  Ki 67 decreased to 30% from 80% on biopsy. - She was recommended 1 year of Herceptin.  Pertuzumab was not added secondary to lymph node negativity. - Herceptin every 3 weeks started on 02/07/2018.  She is tolerating it well. -Echocardiogram on 10/14/2018 shows EF of 60 to 65%. -Anastrozole started on 02/17/2018.  She is tolerating it very well with minor hot flashes. -Right breast mammogram on 05/31/2018 was BI-RADS Category 1.  Left mastectomy site is within normal limits. -We have reviewed labs.  She will proceed with her Herceptin today and in 3 weeks.  03/04/2019 will be her last treatment. -We reviewed echo results from 01/04/2019 which showed ejection fraction of 55-60%. -I will see her back in 2 months for follow-up.  We will plan to schedule her mammogram after 06/01/2019.   2.  Osteopenia: -DEXA scan on 03/07/2018 shows T score of -1.8. -She will continue taking calcium and vitamin D twice daily.  I plan to repeat DEXA scan in 2 years.  3.  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  She has mild numbness in the fingertips which is constant.  No medication needed.    

## 2019-04-14 ENCOUNTER — Ambulatory Visit (HOSPITAL_COMMUNITY): Payer: Medicare Other | Admitting: Nurse Practitioner

## 2019-04-14 ENCOUNTER — Inpatient Hospital Stay (HOSPITAL_COMMUNITY): Payer: Medicaid Other

## 2019-04-16 ENCOUNTER — Other Ambulatory Visit: Payer: Self-pay

## 2019-04-17 ENCOUNTER — Other Ambulatory Visit: Payer: Self-pay

## 2019-04-17 ENCOUNTER — Inpatient Hospital Stay (HOSPITAL_COMMUNITY): Payer: Medicare Other | Attending: Hematology

## 2019-04-17 ENCOUNTER — Encounter (HOSPITAL_COMMUNITY): Payer: Self-pay | Admitting: Nurse Practitioner

## 2019-04-17 ENCOUNTER — Inpatient Hospital Stay (HOSPITAL_BASED_OUTPATIENT_CLINIC_OR_DEPARTMENT_OTHER): Payer: Medicare Other | Admitting: Nurse Practitioner

## 2019-04-17 DIAGNOSIS — Z8349 Family history of other endocrine, nutritional and metabolic diseases: Secondary | ICD-10-CM | POA: Insufficient documentation

## 2019-04-17 DIAGNOSIS — Z170 Estrogen receptor positive status [ER+]: Secondary | ICD-10-CM | POA: Insufficient documentation

## 2019-04-17 DIAGNOSIS — I10 Essential (primary) hypertension: Secondary | ICD-10-CM | POA: Insufficient documentation

## 2019-04-17 DIAGNOSIS — C50412 Malignant neoplasm of upper-outer quadrant of left female breast: Secondary | ICD-10-CM

## 2019-04-17 DIAGNOSIS — Z8249 Family history of ischemic heart disease and other diseases of the circulatory system: Secondary | ICD-10-CM | POA: Diagnosis not present

## 2019-04-17 DIAGNOSIS — Z803 Family history of malignant neoplasm of breast: Secondary | ICD-10-CM | POA: Insufficient documentation

## 2019-04-17 DIAGNOSIS — M8580 Other specified disorders of bone density and structure, unspecified site: Secondary | ICD-10-CM | POA: Insufficient documentation

## 2019-04-17 DIAGNOSIS — Z79811 Long term (current) use of aromatase inhibitors: Secondary | ICD-10-CM | POA: Insufficient documentation

## 2019-04-17 DIAGNOSIS — Z79899 Other long term (current) drug therapy: Secondary | ICD-10-CM | POA: Diagnosis not present

## 2019-04-17 LAB — CBC WITH DIFFERENTIAL/PLATELET
Abs Immature Granulocytes: 0.03 10*3/uL (ref 0.00–0.07)
Basophils Absolute: 0 10*3/uL (ref 0.0–0.1)
Basophils Relative: 0 %
Eosinophils Absolute: 0.2 10*3/uL (ref 0.0–0.5)
Eosinophils Relative: 3 %
HCT: 36.8 % (ref 36.0–46.0)
Hemoglobin: 12.7 g/dL (ref 12.0–15.0)
Immature Granulocytes: 1 %
Lymphocytes Relative: 31 %
Lymphs Abs: 1.9 10*3/uL (ref 0.7–4.0)
MCH: 32.6 pg (ref 26.0–34.0)
MCHC: 34.5 g/dL (ref 30.0–36.0)
MCV: 94.4 fL (ref 80.0–100.0)
Monocytes Absolute: 0.4 10*3/uL (ref 0.1–1.0)
Monocytes Relative: 6 %
Neutro Abs: 3.6 10*3/uL (ref 1.7–7.7)
Neutrophils Relative %: 59 %
Platelets: 202 10*3/uL (ref 150–400)
RBC: 3.9 MIL/uL (ref 3.87–5.11)
RDW: 11.9 % (ref 11.5–15.5)
WBC: 6.1 10*3/uL (ref 4.0–10.5)
nRBC: 0 % (ref 0.0–0.2)

## 2019-04-17 LAB — COMPREHENSIVE METABOLIC PANEL
ALT: 27 U/L (ref 0–44)
AST: 38 U/L (ref 15–41)
Albumin: 3.6 g/dL (ref 3.5–5.0)
Alkaline Phosphatase: 55 U/L (ref 38–126)
Anion gap: 8 (ref 5–15)
BUN: 14 mg/dL (ref 8–23)
CO2: 27 mmol/L (ref 22–32)
Calcium: 9.5 mg/dL (ref 8.9–10.3)
Chloride: 100 mmol/L (ref 98–111)
Creatinine, Ser: 0.97 mg/dL (ref 0.44–1.00)
GFR calc Af Amer: >60 mL/min (ref >60)
GFR calc non Af Amer: >60 mL/min (ref >60)
Glucose, Bld: 253 mg/dL — ABNORMAL HIGH (ref 70–99)
Potassium: 4.3 mmol/L (ref 3.5–5.1)
Sodium: 135 mmol/L (ref 135–145)
Total Bilirubin: 0.4 mg/dL (ref 0.3–1.2)
Total Protein: 7.7 g/dL (ref 6.5–8.1)

## 2019-04-17 NOTE — Assessment & Plan Note (Addendum)
1.  Poorly differentiated left breast cancer stage IIIb: - On 06/12/2017 she had a left breast biopsy with sarcomatoid features, left axillary lymph node biopsy was negative. - Her 2D echo showed EF 55 to 60% - CT of the chest showed subcarinal adenopathy. - PET CT scan was negative for metastatic disease. -She had 4 cycles of neoadjuvant dose dense AC from 07/18/2017 through 09/03/2017. - She had 12 cycles of weekly paclitaxel from 09/17/2017 through 12/06/2017. -She underwent a left modified radical mastectomy on 12/31/2017. - The pathology revealed a 2.3 cm poorly differentiated invasive ductal carcinoma with sarcomatoid changes, resection margins negative, high-grade DCIS, no skin involvement, 0 out of 8 lymph nodes positive, negative for lymphovascular or perineural invasion.  ER positive, PR and HER-2 negative.  Pathological staging is YpT2YpN0. - Her receptors were rechecked on a mastectomy specimen and HER-2 was positive by IHC.  This was initially negative by [place] on biopsy specimen.  ER was negative on mastectomy specimen.  It was 60% positive on biopsy specimen.  Ki 67 has decreased from 30% to 80% on biopsy.  This was reviewed by a second pathologist and confirmed. - It was recommended she have 1 year of Herceptin. Pertuzumab was not added secondary to lymph node negativity.  - Echo dated 01/21/2018 showed EF 55 to 60%. -She started on Herceptin 02/07/2018.  She does report occasional diarrhea but no other side effects. - Anastrozole 1 mg started on 02/27/2018.  She is tolerating it very well. - Latest echo on 10/14/2018 showed her EF at 60% to 65%. -Her last mammogram dated 05/31/2018 showed B RADS 1. -Her physical exam today showed mastectomy site within normal limits.  Right breast has no palpable masses or adenopathy. -She completed her Herceptin treatment on 03/04/2019. -Echo on 01/04/2019 showed EF of 55 to 60%. -Labs on 04/17/2019 were all WNL. -She will be scheduled for her next mammogram  after 06/02/2019. -She will continue her anastrozole at this time.  She is tolerating it well. -We will see her back in 2 months with repeat labs and survivorship appointment.  2.  Osteopenia: - Last DEXA scan on 03/07/2018 showed T score of -1.8. -She is taking calcium and vitamin D twice daily. -We will repeat DEXA scan in 2 years.  3.  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 -She has mild numbness in her fingertips which is constant. -No medication needed at this time. -Patient reports her neuropathy is completely gone.

## 2019-04-17 NOTE — Progress Notes (Signed)
[place], [place] [id number]   CLINIC:  Medical Oncology/Hematology  PCP:  Celene Squibb, MD [address] [place] [address] [phone]   REASON FOR VISIT: Follow-up for breast cancer  CURRENT THERAPY: Anastrozole  BRIEF ONCOLOGIC HISTORY:  Oncology History  Breast cancer of upper-outer quadrant of left female breast ([place])  06/22/2017 Initial Diagnosis   Breast cancer of upper-outer quadrant of left female breast ([place])   06/22/2017 Cancer Staging   Staging form: Breast, AJCC 8th Edition - Clinical stage from 06/22/2017: Stage IIIB (cT3, cN1, cM0, G3, ER+, PR-, HER2-) - Signed by Derek Jack, MD on 06/22/2017   06/29/2017 Imaging   CT chest showing left axillary adenopathy, 1.2 cm anterior carinal adenopathy, left subpectoral lymph node subcentimeter  06/25/2017 2D echocardiogram with ejection fraction of 55-60%   07/16/2017 - 12/12/2017 Chemotherapy   The patient had DOXOrubicin (ADRIAMYCIN) chemo injection 110 mg, 60 mg/m2 = 110 mg, Intravenous,  Once, 4 of 4 cycles Administration: 110 mg (07/18/2017), 110 mg (08/01/2017), 110 mg (08/20/2017), 110 mg (09/03/2017) palonosetron (ALOXI) injection 0.25 mg, 0.25 mg, Intravenous,  Once, 4 of 4 cycles Administration: 0.25 mg (07/18/2017), 0.25 mg (08/01/2017), 0.25 mg (08/20/2017), 0.25 mg (09/03/2017) pegfilgrastim-cbqv (UDENYCA) injection 6 mg, 6 mg, Subcutaneous, Once, 4 of 4 cycles Administration: 6 mg (07/19/2017), 6 mg (08/02/2017), 6 mg (08/22/2017), 6 mg (09/05/2017) cyclophosphamide (CYTOXAN) 1,100 mg in sodium chloride 0.9 % 250 mL chemo infusion, 600 mg/m2 = 1,100 mg, Intravenous,  Once, 4 of 4 cycles Administration: 1,100 mg (07/18/2017), 1,100 mg (08/01/2017), 1,100 mg (08/20/2017), 1,100 mg (09/03/2017) PACLitaxel (TAXOL) 120 mg in sodium chloride 0.9 % 250 mL chemo infusion (</= '80mg'$ /m2), 64 mg/m2 = 120 mg (80 % of original dose 80 mg/m2), Intravenous,  Once, 12 of 12 cycles Dose  modification: 64 mg/m2 (80 % of original dose 80 mg/m2, Cycle 5, Reason: Provider Judgment), 60 mg/m2 (75 % of original dose 80 mg/m2, Cycle 15, Reason: Other (see comments)), 40 mg/m2 (50 % of original dose 80 mg/m2, Cycle 16, Reason: Other (see comments)) Administration: 120 mg (09/17/2017), 144 mg (09/24/2017), 144 mg (10/03/2017), 144 mg (10/11/2017), 144 mg (10/18/2017), 144 mg (10/25/2017), 144 mg (11/01/2017), 144 mg (11/08/2017), 144 mg (11/15/2017), 144 mg (11/22/2017), 108 mg (11/29/2017), 72 mg (12/06/2017) fosaprepitant (EMEND) 150 mg, dexamethasone (DECADRON) 12 mg in sodium chloride 0.9 % 145 mL IVPB, , Intravenous,  Once, 4 of 4 cycles Administration:  (07/18/2017),  (08/01/2017),  (08/20/2017),  (09/03/2017)  for chemotherapy treatment.    02/07/2018 -  Chemotherapy   The patient had trastuzumab (HERCEPTIN) 600 mg in sodium chloride 0.9 % 250 mL chemo infusion, 567 mg, Intravenous,  Once, 15 of 15 cycles Administration: 600 mg (02/07/2018), 450 mg (02/27/2018), 450 mg (05/01/2018), 450 mg (05/22/2018), 450 mg (06/13/2018), 450 mg (07/03/2018), 450 mg (07/24/2018), 450 mg (08/15/2018), 450 mg (09/06/2018), 450 mg (03/20/2018), 450 mg (09/25/2018), 450 mg (10/16/2018), 450 mg (11/06/2018), 450 mg (11/27/2018), 450 mg (04/10/2018) trastuzumab-dkst (OGIVRI) 450 mg in sodium chloride 0.9 % 250 mL chemo infusion, 420 mg (100 % of original dose 6 mg/kg), Intravenous,  Once, 4 of 4 cycles Dose modification: 6 mg/kg (original dose 6 mg/kg, Cycle 16, Reason: Other (see comments), Comment: change to biosimilar) Administration: 450 mg (12/23/2018), 450 mg (01/13/2019), 450 mg (02/10/2019), 450 mg (03/04/2019)  for chemotherapy treatment.       CANCER STAGING: Cancer Staging Breast cancer of upper-outer quadrant of left female breast [facility]) Staging  form: Breast, AJCC 8th Edition - Clinical stage from 06/22/2017: Stage IIIB (cT3, cN1, cM0, G3, ER+, PR-, HER2-) - Signed by Derek Jack, MD on 06/22/2017    INTERVAL HISTORY:    Lisa Thornton 64 y.o. female returns for routine follow-up for breast cancer.  Patient reports she is doing well since her last visit.  She does report some mild fatigue day-to-day.  She reports her neuropathy is completely resolved. Denies any nausea, vomiting, or diarrhea. Denies any new pains. Had not noticed any recent bleeding such as epistaxis, hematuria or hematochezia. Denies recent chest pain on exertion, shortness of breath on minimal exertion, pre-syncopal episodes, or palpitations. Denies any numbness or tingling in hands or feet. Denies any recent fevers, infections, or recent hospitalizations. Patient reports appetite at 100% and energy level at 50%.  She is eating well maintain her weight at this time.    REVIEW OF SYSTEMS:  Review of Systems  Constitutional: Positive for fatigue.  All other systems reviewed and are negative.    PAST MEDICAL/SURGICAL HISTORY:  Past Medical History:  Diagnosis Date  . Cancer [facility])    left breast cancer  . Chest pain   . Hypertension    Past Surgical History:  Procedure Laterality Date  . MASTECTOMY MODIFIED RADICAL Left 12/31/2017   Procedure: LEFT MODIFIED RADICAL MASTECTOMY;  Surgeon: Aviva Signs, MD;  Location: [facility];  Service: General;  Laterality: Left;  . PORTACATH PLACEMENT Right 06/27/2017   Procedure: INSERTION PORT-A-CATH;  Surgeon: Aviva Signs, MD;  Location: [facility];  Service: General;  Laterality: Right;     SOCIAL HISTORY:  Social History   Socioeconomic History  . Marital status: Divorced    Spouse name: Not on file  . Number of children: Not on file  . Years of education: Not on file  . Highest education level: Not on file  Occupational History  . Not on file  Tobacco Use  . Smoking status: Never Smoker  . Smokeless tobacco: Never Used  Substance and Sexual Activity  . Alcohol use: No  . Drug use: No  . Sexual activity: Not Currently  Other Topics Concern  . Not on file  Social History Narrative  . Not on  file   Social Determinants of Health   Financial Resource Strain:   . Difficulty of Paying Living Expenses: Not on file  Food Insecurity:   . Worried About Charity fundraiser in the Last Year: Not on file  . Ran Out of Food in the Last Year: Not on file  Transportation Needs:   . Lack of Transportation (Medical): Not on file  . Lack of Transportation (Non-Medical): Not on file  Physical Activity:   . Days of Exercise per Week: Not on file  . Minutes of Exercise per Session: Not on file  Stress:   . Feeling of Stress : Not on file  Social Connections:   . Frequency of Communication with Friends and Family: Not on file  . Frequency of Social Gatherings with Friends and Family: Not on file  . Attends Religious Services: Not on file  . Active Member of Clubs or Organizations: Not on file  . Attends Archivist Meetings: Not on file  . Marital Status: Not on file  Intimate Partner Violence:   . Fear of Current or Ex-Partner: Not on file  . Emotionally Abused: Not on file  . Physically Abused: Not on file  . Sexually Abused: Not on file    FAMILY HISTORY:  Family History  Problem Relation Age of Onset  . Breast cancer Mother   . Thyroid disease Mother   . Heart disease Mother   . Heart attack Father   . Heart attack Sister   . Hypertension Sister   . Heart attack Brother   . Cancer Sister   . Stroke Brother   . Alzheimer's disease Maternal Aunt     CURRENT MEDICATIONS:  Outpatient Encounter Medications as of 04/17/2019  Medication Sig  . amLODipine (NORVASC) 5 MG tablet Take 1 tablet (5 mg total) by mouth daily.  Marland Kitchen anastrozole (ARIMIDEX) 1 MG tablet Take 1 tablet by mouth once daily  . calcium carbonate (CALCIUM 600) 600 MG TABS tablet Take 600 mg by mouth daily with breakfast.  . cholecalciferol (VITAMIN D3) 25 MCG (1000 UT) tablet Take 1,000 Units by mouth 2 (two) times daily.   . metoprolol tartrate (LOPRESSOR) 50 MG tablet Take 50 mg by mouth 2 (two) times  daily.  . Omega-3 Fatty Acids (FISH OIL) 1000 MG CAPS Take 1 capsule by mouth 2 (two) times daily.  . [DISCONTINUED] Trastuzumab (HERCEPTIN IV) Inject into the vein.   No facility-administered encounter medications on file as of 04/17/2019.    ALLERGIES:  No Known Allergies   PHYSICAL EXAM:  ECOG Performance status: 1  Vitals:   04/17/19 0900  BP: (!) 145/70  Pulse: 69  Resp: 16  Temp: 97.9 F (36.6 C)  SpO2: 96%   Filed Weights   04/17/19 0900  Weight: 167 lb 8 oz (76 kg)    Physical Exam Constitutional:      Appearance: Normal appearance. She is normal weight.  Cardiovascular:     Rate and Rhythm: Normal rate and regular rhythm.     Heart sounds: Normal heart sounds.  Pulmonary:     Effort: Pulmonary effort is normal.     Breath sounds: Normal breath sounds.  Abdominal:     General: Bowel sounds are normal.     Palpations: Abdomen is soft.  Musculoskeletal:        General: Normal range of motion.  Skin:    General: Skin is warm.  Neurological:     Mental Status: She is alert and oriented to person, place, and time. Mental status is at baseline.  Psychiatric:        Mood and Affect: Mood normal.        Behavior: Behavior normal.        Thought Content: Thought content normal.        Judgment: Judgment normal.      LABORATORY DATA:  I have reviewed the labs as listed.  CBC    Component Value Date/Time   WBC 6.1 04/17/2019 0853   RBC 3.90 04/17/2019 0853   HGB 12.7 04/17/2019 0853   HCT 36.8 04/17/2019 0853   PLT 202 04/17/2019 0853   MCV 94.4 04/17/2019 0853   MCH 32.6 04/17/2019 0853   MCHC 34.5 04/17/2019 0853   RDW 11.9 04/17/2019 0853   LYMPHSABS 1.9 04/17/2019 0853   MONOABS 0.4 04/17/2019 0853   EOSABS 0.2 04/17/2019 0853   BASOSABS 0.0 04/17/2019 0853   CMP Latest Ref Rng & Units 04/17/2019 03/04/2019 02/10/2019  Glucose 70 - 99 mg/dL 253(H) 205(H) 204(H)  BUN 8 - 23 mg/dL '14 12 18  '$ Creatinine 0.44 - 1.00 mg/dL 0.97 0.92 0.91  Sodium  135 - 145 mmol/L 135 136 134(L)  Potassium 3.5 - 5.1 mmol/L 4.3 4.5 4.0  Chloride 98 - 111  mmol/L 100 104 103  CO2 22 - 32 mmol/L '27 24 23  '$ Calcium 8.9 - 10.3 mg/dL 9.5 9.6 9.2  Total Protein 6.5 - 8.1 g/dL 7.7 7.6 7.7  Total Bilirubin 0.3 - 1.2 mg/dL 0.4 0.5 0.5  Alkaline Phos 38 - 126 U/L 55 54 56  AST 15 - 41 U/L 38 44(H) 38  ALT 0 - 44 U/L 27 36 31     I personally performed a face-to-face visit.  All questions were answered to patient's stated satisfaction. Encouraged patient to call with any new concerns or questions before his next visit to the cancer center and we can certain see him sooner, if needed.     ASSESSMENT & PLAN:   Breast cancer of upper-outer quadrant of left female breast ([place]) 1.  Poorly differentiated left breast cancer stage IIIb: - On 06/12/2017 she had a left breast biopsy with sarcomatoid features, left axillary lymph node biopsy was negative. - Her 2D echo showed EF 55 to 60% - CT of the chest showed subcarinal adenopathy. - PET CT scan was negative for metastatic disease. -She had 4 cycles of neoadjuvant dose dense AC from 07/18/2017 through 09/03/2017. - She had 12 cycles of weekly paclitaxel from 09/17/2017 through 12/06/2017. -She underwent a left modified radical mastectomy on 12/31/2017. - The pathology revealed a 2.3 cm poorly differentiated invasive ductal carcinoma with sarcomatoid changes, resection margins negative, high-grade DCIS, no skin involvement, 0 out of 8 lymph nodes positive, negative for lymphovascular or perineural invasion.  ER positive, PR and HER-2 negative.  Pathological staging is YpT2YpN0. - Her receptors were rechecked on a mastectomy specimen and HER-2 was positive by IHC.  This was initially negative by [place] on biopsy specimen.  ER was negative on mastectomy specimen.  It was 60% positive on biopsy specimen.  Ki 67 has decreased from 30% to 80% on biopsy.  This was reviewed by a second pathologist and confirmed. - It was recommended  she have 1 year of Herceptin. Pertuzumab was not added secondary to lymph node negativity.  - Echo dated 01/21/2018 showed EF 55 to 60%. -She started on Herceptin 02/07/2018.  She does report occasional diarrhea but no other side effects. - Anastrozole 1 mg started on 02/27/2018.  She is tolerating it very well. - Latest echo on 10/14/2018 showed her EF at 60% to 65%. -Her last mammogram dated 05/31/2018 showed B RADS 1. -Her physical exam today showed mastectomy site within normal limits.  Right breast has no palpable masses or adenopathy. -She completed her Herceptin treatment on 03/04/2019. -Echo on 01/04/2019 showed EF of 55 to 60%. -Labs on 04/17/2019 were all WNL. -She will be scheduled for her next mammogram after 06/02/2019. -She will continue her anastrozole at this time.  She is tolerating it well. -We will see her back in 2 months with repeat labs and survivorship appointment.  2.  Osteopenia: - Last DEXA scan on 03/07/2018 showed T score of -1.8. -She is taking calcium and vitamin D twice daily. -We will repeat DEXA scan in 2 years.  3.  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 -She has mild numbness in her fingertips which is constant. -No medication needed at this time. -Patient reports her neuropathy is completely gone.      Orders placed this encounter:  Orders Placed This Encounter  Procedures  . Lactate dehydrogenase  . CBC with Differential/Platelet  . Comprehensive metabolic panel  . Vitamin B12  . Vitamin D 25 hydroxy  Francene Finders, FNP-C [place] [phone]

## 2019-04-17 NOTE — Patient Instructions (Signed)
[place] at [facility] Discharge Instructions  Follow up in 2 months with labs and mammogram   Thank you for choosing [place] at [facility] to provide your oncology and hematology care.  To afford each patient quality time with our provider, please arrive at least 15 minutes before your scheduled appointment time.   If you have a lab appointment with the [facility] please come in thru the Main Entrance and check in at the main information desk.  You need to re-schedule your appointment should you arrive 10 or more minutes late.  We strive to give you quality time with our providers, and arriving late affects you and other patients whose appointments are after yours.  Also, if you no show three or more times for appointments you may be dismissed from the clinic at the providers discretion.     Again, thank you for choosing [facility].  Our hope is that these requests will decrease the amount of time that you wait before being seen by our physicians.       _____________________________________________________________  Should you have questions after your visit to [facility], please contact our office at [phone] between the hours of 8:00 a.m. and 4:30 p.m.  Voicemails left after 4:00 p.m. will not be returned until the following business day.  For prescription refill requests, have your pharmacy contact our office and allow 72 hours.    Due to Covid, you will need to wear a mask upon entering the hospital. If you do not have a mask, a mask will be given to you at the Main Entrance upon arrival. For doctor visits, patients may have 1 support person with them. For treatment visits, patients can not have anyone with them due to social distancing guidelines and our immunocompromised population.

## 2019-06-02 ENCOUNTER — Other Ambulatory Visit: Payer: Self-pay

## 2019-06-02 ENCOUNTER — Inpatient Hospital Stay (HOSPITAL_COMMUNITY): Payer: Medicare Other | Attending: Hematology

## 2019-06-02 ENCOUNTER — Ambulatory Visit (HOSPITAL_COMMUNITY)
Admission: RE | Admit: 2019-06-02 | Discharge: 2019-06-02 | Disposition: A | Discharge status: Home / Self Care | Payer: Medicare Other | Source: Ambulatory Visit | Attending: Hematology | Admitting: Hematology

## 2019-06-02 DIAGNOSIS — Z803 Family history of malignant neoplasm of breast: Secondary | ICD-10-CM | POA: Insufficient documentation

## 2019-06-02 DIAGNOSIS — K5900 Constipation, unspecified: Secondary | ICD-10-CM | POA: Insufficient documentation

## 2019-06-02 DIAGNOSIS — Z9221 Personal history of antineoplastic chemotherapy: Secondary | ICD-10-CM | POA: Insufficient documentation

## 2019-06-02 DIAGNOSIS — Z923 Personal history of irradiation: Secondary | ICD-10-CM | POA: Insufficient documentation

## 2019-06-02 DIAGNOSIS — C50412 Malignant neoplasm of upper-outer quadrant of left female breast: Secondary | ICD-10-CM | POA: Insufficient documentation

## 2019-06-02 DIAGNOSIS — Z1231 Encounter for screening mammogram for malignant neoplasm of breast: Secondary | ICD-10-CM | POA: Diagnosis not present

## 2019-06-02 DIAGNOSIS — R110 Nausea: Secondary | ICD-10-CM | POA: Insufficient documentation

## 2019-06-02 DIAGNOSIS — Z79811 Long term (current) use of aromatase inhibitors: Secondary | ICD-10-CM | POA: Insufficient documentation

## 2019-06-02 DIAGNOSIS — Z170 Estrogen receptor positive status [ER+]: Secondary | ICD-10-CM | POA: Insufficient documentation

## 2019-06-02 DIAGNOSIS — Z9012 Acquired absence of left breast and nipple: Secondary | ICD-10-CM | POA: Insufficient documentation

## 2019-06-03 ENCOUNTER — Ambulatory Visit (HOSPITAL_COMMUNITY): Payer: Medicare Other

## 2019-06-08 ENCOUNTER — Other Ambulatory Visit (HOSPITAL_COMMUNITY): Payer: Self-pay | Admitting: Hematology

## 2019-06-08 DIAGNOSIS — C50412 Malignant neoplasm of upper-outer quadrant of left female breast: Secondary | ICD-10-CM

## 2019-06-13 ENCOUNTER — Other Ambulatory Visit (HOSPITAL_COMMUNITY): Payer: Self-pay | Admitting: Nurse Practitioner

## 2019-06-17 ENCOUNTER — Other Ambulatory Visit: Payer: Self-pay

## 2019-06-17 ENCOUNTER — Inpatient Hospital Stay (HOSPITAL_BASED_OUTPATIENT_CLINIC_OR_DEPARTMENT_OTHER): Payer: Medicare Other | Admitting: Nurse Practitioner

## 2019-06-17 ENCOUNTER — Encounter (HOSPITAL_COMMUNITY): Payer: Self-pay | Admitting: Nurse Practitioner

## 2019-06-17 VITALS — BP 158/68 | HR 73 | Temp 97.5°F | Resp 18 | Wt 169.4 lb

## 2019-06-17 DIAGNOSIS — Z170 Estrogen receptor positive status [ER+]: Secondary | ICD-10-CM

## 2019-06-17 DIAGNOSIS — K5900 Constipation, unspecified: Secondary | ICD-10-CM | POA: Diagnosis not present

## 2019-06-17 DIAGNOSIS — C50412 Malignant neoplasm of upper-outer quadrant of left female breast: Secondary | ICD-10-CM

## 2019-06-17 DIAGNOSIS — R110 Nausea: Secondary | ICD-10-CM | POA: Diagnosis not present

## 2019-06-17 DIAGNOSIS — Z79811 Long term (current) use of aromatase inhibitors: Secondary | ICD-10-CM | POA: Diagnosis not present

## 2019-06-17 DIAGNOSIS — Z9221 Personal history of antineoplastic chemotherapy: Secondary | ICD-10-CM | POA: Diagnosis not present

## 2019-06-17 DIAGNOSIS — Z803 Family history of malignant neoplasm of breast: Secondary | ICD-10-CM | POA: Diagnosis not present

## 2019-06-17 DIAGNOSIS — Z9012 Acquired absence of left breast and nipple: Secondary | ICD-10-CM | POA: Diagnosis not present

## 2019-06-17 DIAGNOSIS — Z923 Personal history of irradiation: Secondary | ICD-10-CM | POA: Diagnosis not present

## 2019-06-17 MED ORDER — ONDANSETRON HCL 4 MG PO TABS: 4.0000 mg | ORAL_TABLET | Freq: Three times a day (TID) | ORAL | 2 refills | Status: DC | PRN

## 2019-06-17 NOTE — Addendum Note (Signed)
Addended by: Glennie Isle on: 06/17/2019 12:45 PM   Modules accepted: Orders

## 2019-06-17 NOTE — Progress Notes (Signed)
CLINIC: Survivorship   REASON FOR VISIT: Routine follow-up for history of breast cancer.   BRIEF ONCOLOGIC HISTORY:  Oncology History  Breast cancer of upper-outer quadrant of left female breast ([place])  06/22/2017 Initial Diagnosis   Breast cancer of upper-outer quadrant of left female breast ([place])   06/22/2017 Cancer Staging   Staging form: Breast, AJCC 8th Edition - Clinical stage from 06/22/2017: Stage IIIB (cT3, cN1, cM0, G3, ER+, PR-, HER2-) - Signed by Derek Jack, MD on 06/22/2017   06/29/2017 Imaging   CT chest showing left axillary adenopathy, 1.2 cm anterior carinal adenopathy, left subpectoral lymph node subcentimeter  06/25/2017 2D echocardiogram with ejection fraction of 55-60%   07/16/2017 - 12/12/2017 Chemotherapy   The patient had DOXOrubicin (ADRIAMYCIN) chemo injection 110 mg, 60 mg/m2 = 110 mg, Intravenous,  Once, 4 of 4 cycles Administration: 110 mg (07/18/2017), 110 mg (08/01/2017), 110 mg (08/20/2017), 110 mg (09/03/2017) palonosetron (ALOXI) injection 0.25 mg, 0.25 mg, Intravenous,  Once, 4 of 4 cycles Administration: 0.25 mg (07/18/2017), 0.25 mg (08/01/2017), 0.25 mg (08/20/2017), 0.25 mg (09/03/2017) pegfilgrastim-cbqv (UDENYCA) injection 6 mg, 6 mg, Subcutaneous, Once, 4 of 4 cycles Administration: 6 mg (07/19/2017), 6 mg (08/02/2017), 6 mg (08/22/2017), 6 mg (09/05/2017) cyclophosphamide (CYTOXAN) 1,100 mg in sodium chloride 0.9 % 250 mL chemo infusion, 600 mg/m2 = 1,100 mg, Intravenous,  Once, 4 of 4 cycles Administration: 1,100 mg (07/18/2017), 1,100 mg (08/01/2017), 1,100 mg (08/20/2017), 1,100 mg (09/03/2017) PACLitaxel (TAXOL) 120 mg in sodium chloride 0.9 % 250 mL chemo infusion (</= '80mg'$ /m2), 64 mg/m2 = 120 mg (80 % of original dose 80 mg/m2), Intravenous,  Once, 12 of 12 cycles Dose modification: 64 mg/m2 (80 % of original dose 80 mg/m2, Cycle 5, Reason: Provider Judgment), 60 mg/m2 (75 % of original dose 80 mg/m2, Cycle 15, Reason: Other (see comments)), 40 mg/m2 (50 %  of original dose 80 mg/m2, Cycle 16, Reason: Other (see comments)) Administration: 120 mg (09/17/2017), 144 mg (09/24/2017), 144 mg (10/03/2017), 144 mg (10/11/2017), 144 mg (10/18/2017), 144 mg (10/25/2017), 144 mg (11/01/2017), 144 mg (11/08/2017), 144 mg (11/15/2017), 144 mg (11/22/2017), 108 mg (11/29/2017), 72 mg (12/06/2017) fosaprepitant (EMEND) 150 mg, dexamethasone (DECADRON) 12 mg in sodium chloride 0.9 % 145 mL IVPB, , Intravenous,  Once, 4 of 4 cycles Administration:  (07/18/2017),  (08/01/2017),  (08/20/2017),  (09/03/2017)  for chemotherapy treatment.    02/07/2018 -  Chemotherapy   The patient had trastuzumab (HERCEPTIN) 600 mg in sodium chloride 0.9 % 250 mL chemo infusion, 567 mg, Intravenous,  Once, 15 of 15 cycles Administration: 600 mg (02/07/2018), 450 mg (02/27/2018), 450 mg (05/01/2018), 450 mg (05/22/2018), 450 mg (06/13/2018), 450 mg (07/03/2018), 450 mg (07/24/2018), 450 mg (08/15/2018), 450 mg (09/06/2018), 450 mg (03/20/2018), 450 mg (09/25/2018), 450 mg (10/16/2018), 450 mg (11/06/2018), 450 mg (11/27/2018), 450 mg (04/10/2018) trastuzumab-dkst (OGIVRI) 450 mg in sodium chloride 0.9 % 250 mL chemo infusion, 420 mg (100 % of original dose 6 mg/kg), Intravenous,  Once, 4 of 4 cycles Dose modification: 6 mg/kg (original dose 6 mg/kg, Cycle 16, Reason: Other (see comments), Comment: change to biosimilar) Administration: 450 mg (12/23/2018), 450 mg (01/13/2019), 450 mg (02/10/2019), 450 mg (03/04/2019)  for chemotherapy treatment.     Radiation Therapy        INTERVAL HISTORY:  Ms. Fluty presents to the Survivorship Clinic today for routine follow-up for her history of breast cancer.  Overall, she reports feeling quite well.  She denies any new lumps or bumps present.  She recently  had a negative mammogram.  She is taking her medication anastrozole as prescribed with no side effects.  She does report occasional nausea when she is hungry.  And has problems with constipation from time to time. Denies any vomiting or  diarrhea. Denies any new pains. Had not noticed any recent bleeding such as epistaxis, hematuria or hematochezia. Denies recent chest pain on exertion, shortness of breath on minimal exertion, pre-syncopal episodes, or palpitations. Denies any numbness or tingling in hands or feet. Denies any recent fevers, infections, or recent hospitalizations. Patient reports appetite at 100% and energy level at 75%.  She has maintained her weight at this time.     REVIEW OF SYSTEMS:  Review of Systems  Gastrointestinal: Positive for constipation and nausea.  All other systems reviewed and are negative.  Breast: Denies any new nodularity, masses, tenderness, nipple changes, or nipple discharge.       PAST MEDICAL/SURGICAL HISTORY:  Past Medical History:  Diagnosis Date  . Cancer [facility])    left breast cancer  . Chest pain   . Hypertension    Past Surgical History:  Procedure Laterality Date  . MASTECTOMY MODIFIED RADICAL Left 12/31/2017   Procedure: LEFT MODIFIED RADICAL MASTECTOMY;  Surgeon: Aviva Signs, MD;  Location: [facility];  Service: General;  Laterality: Left;  . PORTACATH PLACEMENT Right 06/27/2017   Procedure: INSERTION PORT-A-CATH;  Surgeon: Aviva Signs, MD;  Location: [facility];  Service: General;  Laterality: Right;     ALLERGIES:  No Known Allergies   CURRENT MEDICATIONS:  Outpatient Encounter Medications as of 06/17/2019  Medication Sig  . amLODipine (NORVASC) 5 MG tablet Take 1 tablet (5 mg total) by mouth daily.  Marland Kitchen anastrozole (ARIMIDEX) 1 MG tablet Take 1 tablet by mouth once daily  . calcium carbonate (CALCIUM 600) 600 MG TABS tablet Take 600 mg by mouth daily with breakfast.  . cholecalciferol (VITAMIN D3) 25 MCG (1000 UT) tablet Take 1,000 Units by mouth 2 (two) times daily.   . metoprolol tartrate (LOPRESSOR) 50 MG tablet Take 50 mg by mouth 2 (two) times daily.  . Omega-3 Fatty Acids (FISH OIL) 1000 MG CAPS Take 1 capsule by mouth 2 (two) times daily.   No  facility-administered encounter medications on file as of 06/17/2019.     ONCOLOGIC FAMILY HISTORY:  Family History  Problem Relation Age of Onset  . Breast cancer Mother   . Thyroid disease Mother   . Heart disease Mother   . Heart attack Father   . Heart attack Sister   . Hypertension Sister   . Heart attack Brother   . Cancer Sister   . Stroke Brother   . Alzheimer's disease Maternal Aunt     GENETIC COUNSELING/TESTING: N/A  SOCIAL HISTORY:  COLIE JOSTEN is divorced and lives alone in [place], [place].  She has nieces and nephews that live nearby that help her when she needs help.  Ms. Sipos is currently disabled living at home.  She denies any current or history of tobacco, alcohol, or illicit drug use.      PHYSICAL EXAMINATION:  Vital Signs: Vitals:   06/17/19 0912  BP: (!) 158/68  Pulse: 73  Resp: 18  Temp: (!) 97.5 F (36.4 C)  SpO2: 97%   Filed Weights   06/17/19 0912  Weight: 169 lb 6.4 oz (76.8 kg)   General: Well-nourished, well-appearing female in no acute distress.  Unaccompanied today.   HEENT: Head is normocephalic.  Pupils equal and reactive to light.  Conjunctivae clear without exudate.  Sclerae anicteric. Oral mucosa is pink, moist.  Oropharynx is pink without lesions or erythema.  Lymph: No cervical, supraclavicular, or infraclavicular lymphadenopathy noted on palpation.  Cardiovascular: Regular rate and rhythm.Marland Kitchen Respiratory: Clear to auscultation bilaterally. Chest expansion symmetric; breathing non-labored.  Breast Exam:  -Left breast: No appreciable masses on palpation. No skin redness, thickening, or peau d'orange appearance; no nipple retraction or nipple discharge; mastectomy site well healed scar without erythema or nodularity.  -Right breast: No appreciable masses on palpation. No skin redness, thickening, or peau d'orange appearance; no nipple retraction or nipple discharge; -Axilla: No axillary adenopathy bilaterally.  GI: Abdomen  soft and round; non-tender, non-distended. Bowel sounds normoactive. No hepatosplenomegaly.   GU: Deferred.  Neuro: No focal deficits. Steady gait.  Psych: Mood and affect normal and appropriate for situation.  MSK: No focal spinal tenderness to palpation, full range of motion in bilateral upper extremities Extremities: No edema. Skin: Warm and dry.  LABORATORY DATA:  None for this visit   DIAGNOSTIC IMAGING:  Most recent mammogram: Last mammogram on 06/02/2019 was BI-RADS Category 1 negative.    ASSESSMENT AND PLAN:  Ms.. Schwenke is a pleasant 64 y.o. female with history of Stage IIIB left breast invasive ductal carcinoma, ER+/PR-/HER2+, diagnosed in 06/22/2017, treated with mastectomy, and anti-estrogen therapy with anastrozole beginning in 02/17/2018.  She presents to the Survivorship Clinic for surveillance and routine follow-up.   1. History of breast cancer:  Ms. Lavalle is currently clinically and radiographically without evidence of disease or recurrence of breast cancer. She will be due for mammogram in 06/2020.  She will return in 4 months for continue LTS follow up.  I encouraged her to call me with any questions or concerns before her next visit at the cancer center, and I would be happy to see her sooner, if needed.    #. Problem(s) at Visit included mild nausea and constipation.  I prescribed Zofran every 8 hours as needed.  And she was also placed on Colace 100 mg daily.  #. Bone health:  Given Ms. Haws age, history of breast cancer, and her current anti-estrogen therapy with anastrozole, she is at risk for bone demineralization. Her last DEXA scan was on 03/07/2018.  In the meantime, she was encouraged to increase her consumption of foods rich in calcium, as well as increase her weight-bearing activities.  She was given education on specific food and activities to promote bone health.  #. Cancer screening:  Due to Ms. Pallone's history and her age, she should receive screening for skin  cancers, colon cancer, lung cancer, and gynecologic cancers. She was encouraged to follow-up with her PCP for appropriate cancer screenings.   #. Health maintenance and wellness promotion: Ms. Sula was encouraged to consume 5-7 servings of fruits and vegetables per day. She was also encouraged to engage in moderate to vigorous exercise for 30 minutes per day most days of the week. We discussed that a healthy BMI is 18.5-24.9 and that maintaining a healthy weight reduces risk of cancer recurrences.  She was instructed to limit her alcohol consumption and continue to abstain from tobacco use/was encouraged stop smoking.  Lastly, he/she was encouraged to use sunscreen and wear protective clothing when in the sun.      Dispo:  -Return to cancer center in 4 months with repeat labs.   A total of (30) minutes of face-to-face time was spent with this patient with greater than 50% of that time in counseling and  care-coordination.   Francene Finders, NP Survivorship Program [place] [phone]   Note: PRIMARY CARE PROVIDER Celene Squibb, MD [phone]

## 2019-06-17 NOTE — Patient Instructions (Signed)
[facility] Discharge Instructions  Follow up in 4 months with labs    Thank you for choosing [facility] at [facility] to provide your oncology and hematology care.  To afford each patient quality time with our provider, please arrive at least 15 minutes before your scheduled appointment time.   If you have a lab appointment with the Cancer Center please come in thru the Main Entrance and check in at the main information desk.  You need to re-schedule your appointment should you arrive 10 or more minutes late.  We strive to give you quality time with our providers, and arriving late affects you and other patients whose appointments are after yours.  Also, if you no show three or more times for appointments you may be dismissed from the clinic at the providers discretion.     Again, thank you for choosing [facility].  Our hope is that these requests will decrease the amount of time that you wait before being seen by our physicians.       _____________________________________________________________  Should you have questions after your visit to [facility], please contact our office at [phone] between the hours of 8:00 a.m. and 4:30 p.m.  Voicemails left after 4:00 p.m. will not be returned until the following business day.  For prescription refill requests, have your pharmacy contact our office and allow 72 hours.    Due to Covid, you will need to wear a mask upon entering the hospital. If you do not have a mask, a mask will be given to you at the Main Entrance upon arrival. For doctor visits, patients may have 1 support person with them. For treatment visits, patients can not have anyone with them due to social distancing guidelines and our immunocompromised population.      

## 2019-08-07 DIAGNOSIS — M79671 Pain in right foot: Secondary | ICD-10-CM | POA: Diagnosis not present

## 2019-08-07 DIAGNOSIS — M79672 Pain in left foot: Secondary | ICD-10-CM | POA: Diagnosis not present

## 2019-08-07 DIAGNOSIS — I739 Peripheral vascular disease, unspecified: Secondary | ICD-10-CM | POA: Diagnosis not present

## 2019-09-22 ENCOUNTER — Telehealth: Payer: Medicare Other | Admitting: Cardiovascular Disease

## 2019-09-23 ENCOUNTER — Telehealth: Payer: Medicare Other | Admitting: Cardiovascular Disease

## 2019-10-17 ENCOUNTER — Inpatient Hospital Stay (HOSPITAL_COMMUNITY): Payer: Medicaid Other

## 2019-10-20 ENCOUNTER — Other Ambulatory Visit (HOSPITAL_COMMUNITY): Payer: Self-pay | Admitting: *Deleted

## 2019-10-20 ENCOUNTER — Other Ambulatory Visit (HOSPITAL_COMMUNITY): Payer: Self-pay

## 2019-10-20 ENCOUNTER — Other Ambulatory Visit: Payer: Self-pay

## 2019-10-20 ENCOUNTER — Encounter (HOSPITAL_COMMUNITY): Payer: Self-pay

## 2019-10-20 ENCOUNTER — Telehealth (HOSPITAL_COMMUNITY): Payer: Self-pay | Admitting: *Deleted

## 2019-10-20 ENCOUNTER — Inpatient Hospital Stay (HOSPITAL_COMMUNITY): Payer: Medicare Other | Attending: Hematology

## 2019-10-20 DIAGNOSIS — R300 Dysuria: Secondary | ICD-10-CM

## 2019-10-20 DIAGNOSIS — I10 Essential (primary) hypertension: Secondary | ICD-10-CM | POA: Diagnosis not present

## 2019-10-20 DIAGNOSIS — Z823 Family history of stroke: Secondary | ICD-10-CM | POA: Insufficient documentation

## 2019-10-20 DIAGNOSIS — R197 Diarrhea, unspecified: Secondary | ICD-10-CM | POA: Diagnosis not present

## 2019-10-20 DIAGNOSIS — M8580 Other specified disorders of bone density and structure, unspecified site: Secondary | ICD-10-CM | POA: Diagnosis not present

## 2019-10-20 DIAGNOSIS — Z803 Family history of malignant neoplasm of breast: Secondary | ICD-10-CM | POA: Diagnosis not present

## 2019-10-20 DIAGNOSIS — Z79899 Other long term (current) drug therapy: Secondary | ICD-10-CM | POA: Insufficient documentation

## 2019-10-20 DIAGNOSIS — K5900 Constipation, unspecified: Secondary | ICD-10-CM | POA: Insufficient documentation

## 2019-10-20 DIAGNOSIS — Z170 Estrogen receptor positive status [ER+]: Secondary | ICD-10-CM | POA: Insufficient documentation

## 2019-10-20 DIAGNOSIS — C50412 Malignant neoplasm of upper-outer quadrant of left female breast: Secondary | ICD-10-CM | POA: Insufficient documentation

## 2019-10-20 DIAGNOSIS — Z8249 Family history of ischemic heart disease and other diseases of the circulatory system: Secondary | ICD-10-CM | POA: Insufficient documentation

## 2019-10-20 DIAGNOSIS — Z8349 Family history of other endocrine, nutritional and metabolic diseases: Secondary | ICD-10-CM | POA: Diagnosis not present

## 2019-10-20 DIAGNOSIS — Z818 Family history of other mental and behavioral disorders: Secondary | ICD-10-CM | POA: Diagnosis not present

## 2019-10-20 LAB — URINALYSIS, ROUTINE W REFLEX MICROSCOPIC
Bilirubin Urine: NEGATIVE
Glucose, UA: >=500 mg/dL — AB
Ketones, ur: NEGATIVE mg/dL
Nitrite: NEGATIVE
Protein, ur: NEGATIVE mg/dL
Specific Gravity, Urine: 1.025 (ref 1.005–1.030)
WBC, UA: >50 WBC/hpf — ABNORMAL HIGH (ref 0–5)
pH: 5 (ref 5.0–8.0)

## 2019-10-20 LAB — CBC WITH DIFFERENTIAL/PLATELET
Abs Immature Granulocytes: 0.01 10*3/uL (ref 0.00–0.07)
Basophils Absolute: 0 10*3/uL (ref 0.0–0.1)
Basophils Relative: 1 %
Eosinophils Absolute: 0.1 10*3/uL (ref 0.0–0.5)
Eosinophils Relative: 2 %
HCT: 36.3 % (ref 36.0–46.0)
Hemoglobin: 12.8 g/dL (ref 12.0–15.0)
Immature Granulocytes: 0 %
Lymphocytes Relative: 29 %
Lymphs Abs: 1.7 10*3/uL (ref 0.7–4.0)
MCH: 32.1 pg (ref 26.0–34.0)
MCHC: 35.3 g/dL (ref 30.0–36.0)
MCV: 91 fL (ref 80.0–100.0)
Monocytes Absolute: 0.3 10*3/uL (ref 0.1–1.0)
Monocytes Relative: 5 %
Neutro Abs: 3.7 10*3/uL (ref 1.7–7.7)
Neutrophils Relative %: 63 %
Platelets: 183 10*3/uL (ref 150–400)
RBC: 3.99 MIL/uL (ref 3.87–5.11)
RDW: 11.6 % (ref 11.5–15.5)
WBC: 5.9 10*3/uL (ref 4.0–10.5)
nRBC: 0 % (ref 0.0–0.2)

## 2019-10-20 LAB — COMPREHENSIVE METABOLIC PANEL
ALT: 33 U/L (ref 0–44)
AST: 78 U/L — ABNORMAL HIGH (ref 15–41)
Albumin: 3.8 g/dL (ref 3.5–5.0)
Alkaline Phosphatase: 62 U/L (ref 38–126)
Anion gap: 11 (ref 5–15)
BUN: 15 mg/dL (ref 8–23)
CO2: 28 mmol/L (ref 22–32)
Calcium: 9.9 mg/dL (ref 8.9–10.3)
Chloride: 93 mmol/L — ABNORMAL LOW (ref 98–111)
Creatinine, Ser: 1.06 mg/dL — ABNORMAL HIGH (ref 0.44–1.00)
GFR calc Af Amer: >60 mL/min (ref >60)
GFR calc non Af Amer: 55 mL/min — ABNORMAL LOW (ref >60)
Glucose, Bld: 525 mg/dL (ref 70–99)
Potassium: 4.5 mmol/L (ref 3.5–5.1)
Sodium: 132 mmol/L — ABNORMAL LOW (ref 135–145)
Total Bilirubin: 0.6 mg/dL (ref 0.3–1.2)
Total Protein: 7.6 g/dL (ref 6.5–8.1)

## 2019-10-20 LAB — VITAMIN B12: Vitamin B-12: 3514 pg/mL — ABNORMAL HIGH (ref 180–914)

## 2019-10-20 LAB — LACTATE DEHYDROGENASE: LDH: 154 U/L (ref 98–192)

## 2019-10-20 LAB — VITAMIN D 25 HYDROXY (VIT D DEFICIENCY, FRACTURES): Vit D, 25-Hydroxy: 31.19 ng/mL (ref 30–100)

## 2019-10-20 MED ORDER — CIPROFLOXACIN HCL 500 MG PO TABS
500.0000 mg | ORAL_TABLET | Freq: Two times a day (BID) | ORAL | 0 refills | Status: AC
Start: 2019-10-20 — End: 2019-10-25

## 2019-10-20 NOTE — Progress Notes (Signed)
Dr. Delton Coombes notified of UA results. Medication orders placed per verbal order and patient notified.  Critical value alert:  Glucose 525 Dr. Delton Coombes notified and orders received to notify Dr. Nevada Crane, patient's PCP, to manage diabetes medications accordingly.

## 2019-10-20 NOTE — Progress Notes (Signed)
Patient called clinic reporting increased burning with urination and lower back pain x 2 weeks. She has been taking AZO tablets without relief.  She is already scheduled for lab work today. We will get a urine sample at that time.

## 2019-10-22 LAB — URINE CULTURE

## 2019-10-24 ENCOUNTER — Other Ambulatory Visit: Payer: Self-pay

## 2019-10-24 ENCOUNTER — Inpatient Hospital Stay (HOSPITAL_BASED_OUTPATIENT_CLINIC_OR_DEPARTMENT_OTHER): Payer: Medicare Other | Admitting: Nurse Practitioner

## 2019-10-24 VITALS — BP 165/71 | HR 70 | Temp 98.7°F | Resp 18 | Wt 161.0 lb

## 2019-10-24 DIAGNOSIS — Z823 Family history of stroke: Secondary | ICD-10-CM | POA: Diagnosis not present

## 2019-10-24 DIAGNOSIS — M8580 Other specified disorders of bone density and structure, unspecified site: Secondary | ICD-10-CM | POA: Diagnosis not present

## 2019-10-24 DIAGNOSIS — Z79899 Other long term (current) drug therapy: Secondary | ICD-10-CM | POA: Diagnosis not present

## 2019-10-24 DIAGNOSIS — Z170 Estrogen receptor positive status [ER+]: Secondary | ICD-10-CM

## 2019-10-24 DIAGNOSIS — I10 Essential (primary) hypertension: Secondary | ICD-10-CM | POA: Diagnosis not present

## 2019-10-24 DIAGNOSIS — R197 Diarrhea, unspecified: Secondary | ICD-10-CM | POA: Diagnosis not present

## 2019-10-24 DIAGNOSIS — Z1231 Encounter for screening mammogram for malignant neoplasm of breast: Secondary | ICD-10-CM | POA: Diagnosis not present

## 2019-10-24 DIAGNOSIS — Z8249 Family history of ischemic heart disease and other diseases of the circulatory system: Secondary | ICD-10-CM | POA: Diagnosis not present

## 2019-10-24 DIAGNOSIS — K5900 Constipation, unspecified: Secondary | ICD-10-CM | POA: Diagnosis not present

## 2019-10-24 DIAGNOSIS — Z803 Family history of malignant neoplasm of breast: Secondary | ICD-10-CM | POA: Diagnosis not present

## 2019-10-24 DIAGNOSIS — Z8349 Family history of other endocrine, nutritional and metabolic diseases: Secondary | ICD-10-CM | POA: Diagnosis not present

## 2019-10-24 DIAGNOSIS — C50412 Malignant neoplasm of upper-outer quadrant of left female breast: Secondary | ICD-10-CM

## 2019-10-24 NOTE — Progress Notes (Signed)
[place], [place] [address]   CLINIC:  Medical Oncology/Hematology  PCP:  Fayrene Helper, MD [address] [place] [address] [phone]   REASON FOR VISIT: Follow-up for breast cancer   CURRENT THERAPY: Anastrozole  BRIEF ONCOLOGIC HISTORY:  Oncology History  Breast cancer of upper-outer quadrant of left female breast ([facility])  06/22/2017 Initial Diagnosis   Breast cancer of upper-outer quadrant of left female breast ([facility])   06/22/2017 Cancer Staging   Staging form: Breast, AJCC 8th Edition - Clinical stage from 06/22/2017: Stage IIIB (cT3, cN1, cM0, G3, ER+, PR-, HER2-) - Signed by Derek Jack, MD on 06/22/2017   06/29/2017 Imaging   CT chest showing left axillary adenopathy, 1.2 cm anterior carinal adenopathy, left subpectoral lymph node subcentimeter  06/25/2017 2D echocardiogram with ejection fraction of 55-60%   07/16/2017 - 12/12/2017 Chemotherapy   The patient had DOXOrubicin (ADRIAMYCIN) chemo injection 110 mg, 60 mg/m2 = 110 mg, Intravenous,  Once, 4 of 4 cycles Administration: 110 mg (07/18/2017), 110 mg (08/01/2017), 110 mg (08/20/2017), 110 mg (09/03/2017) palonosetron (ALOXI) injection 0.25 mg, 0.25 mg, Intravenous,  Once, 4 of 4 cycles Administration: 0.25 mg (07/18/2017), 0.25 mg (08/01/2017), 0.25 mg (08/20/2017), 0.25 mg (09/03/2017) pegfilgrastim-cbqv (UDENYCA) injection 6 mg, 6 mg, Subcutaneous, Once, 4 of 4 cycles Administration: 6 mg (07/19/2017), 6 mg (08/02/2017), 6 mg (08/22/2017), 6 mg (09/05/2017) cyclophosphamide (CYTOXAN) 1,100 mg in sodium chloride 0.9 % 250 mL chemo infusion, 600 mg/m2 = 1,100 mg, Intravenous,  Once, 4 of 4 cycles Administration: 1,100 mg (07/18/2017), 1,100 mg (08/01/2017), 1,100 mg (08/20/2017), 1,100 mg (09/03/2017) PACLitaxel (TAXOL) 120 mg in sodium chloride 0.9 % 250 mL chemo infusion (</= 26m/m2), 64 mg/m2 = 120 mg (80 % of original dose 80 mg/m2), Intravenous,  Once, 12 of 12  cycles Dose modification: 64 mg/m2 (80 % of original dose 80 mg/m2, Cycle 5, Reason: Provider Judgment), 60 mg/m2 (75 % of original dose 80 mg/m2, Cycle 15, Reason: Other (see comments)), 40 mg/m2 (50 % of original dose 80 mg/m2, Cycle 16, Reason: Other (see comments)) Administration: 120 mg (09/17/2017), 144 mg (09/24/2017), 144 mg (10/03/2017), 144 mg (10/11/2017), 144 mg (10/18/2017), 144 mg (10/25/2017), 144 mg (11/01/2017), 144 mg (11/08/2017), 144 mg (11/15/2017), 144 mg (11/22/2017), 108 mg (11/29/2017), 72 mg (12/06/2017) fosaprepitant (EMEND) 150 mg, dexamethasone (DECADRON) 12 mg in sodium chloride 0.9 % 145 mL IVPB, , Intravenous,  Once, 4 of 4 cycles Administration:  (07/18/2017),  (08/01/2017),  (08/20/2017),  (09/03/2017)  for chemotherapy treatment.    02/07/2018 -  Chemotherapy   The patient had trastuzumab (HERCEPTIN) 600 mg in sodium chloride 0.9 % 250 mL chemo infusion, 567 mg, Intravenous,  Once, 15 of 15 cycles Administration: 600 mg (02/07/2018), 450 mg (02/27/2018), 450 mg (05/01/2018), 450 mg (05/22/2018), 450 mg (06/13/2018), 450 mg (07/03/2018), 450 mg (07/24/2018), 450 mg (08/15/2018), 450 mg (09/06/2018), 450 mg (03/20/2018), 450 mg (09/25/2018), 450 mg (10/16/2018), 450 mg (11/06/2018), 450 mg (11/27/2018), 450 mg (04/10/2018) trastuzumab-dkst (OGIVRI) 450 mg in sodium chloride 0.9 % 250 mL chemo infusion, 420 mg (100 % of original dose 6 mg/kg), Intravenous,  Once, 4 of 4 cycles Dose modification: 6 mg/kg (original dose 6 mg/kg, Cycle 16, Reason: Other (see comments), Comment: change to biosimilar) Administration: 450 mg (12/23/2018), 450 mg (01/13/2019), 450 mg (02/10/2019), 450 mg (03/04/2019)  for chemotherapy treatment.     Radiation Therapy       CANCER STAGING: Cancer Staging Breast cancer of  upper-outer quadrant of left female breast [facility]) Staging form: Breast, AJCC 8th Edition - Clinical stage from 06/22/2017: Stage IIIB (cT3, cN1, cM0, G3, ER+, PR-, HER2-) - Signed by Derek Jack, MD on  06/22/2017    INTERVAL HISTORY:  Lisa Thornton 64 y.o. female returns for routine follow-up for breast cancer.  Patient reports she has been doing well since her last visit.  She is taking her anastrozole as prescribed with no unwanted side effects.  She denies any new lumps or runs present.  She denies any new bone pain. Denies any nausea, vomiting, or diarrhea. Denies any new pains. Had not noticed any recent bleeding such as epistaxis, hematuria or hematochezia. Denies recent chest pain on exertion, shortness of breath on minimal exertion, pre-syncopal episodes, or palpitations. Denies any numbness or tingling in hands or feet. Denies any recent fevers, infections, or recent hospitalizations. Patient reports appetite at 50% and energy level at 25%.  She is eating well maintain her weight at this time.     REVIEW OF SYSTEMS:  Review of Systems  Gastrointestinal: Positive for constipation.  All other systems reviewed and are negative.    PAST MEDICAL/SURGICAL HISTORY:  Past Medical History:  Diagnosis Date  . Cancer [facility])    left breast cancer  . Chest pain   . Hypertension    Past Surgical History:  Procedure Laterality Date  . MASTECTOMY MODIFIED RADICAL Left 12/31/2017   Procedure: LEFT MODIFIED RADICAL MASTECTOMY;  Surgeon: Aviva Signs, MD;  Location: [facility];  Service: General;  Laterality: Left;  . PORTACATH PLACEMENT Right 06/27/2017   Procedure: INSERTION PORT-A-CATH;  Surgeon: Aviva Signs, MD;  Location: [facility];  Service: General;  Laterality: Right;     SOCIAL HISTORY:  Social History   Socioeconomic History  . Marital status: Divorced    Spouse name: Not on file  . Number of children: Not on file  . Years of education: Not on file  . Highest education level: Not on file  Occupational History  . Not on file  Tobacco Use  . Smoking status: Never Smoker  . Smokeless tobacco: Never Used  Vaping Use  . Vaping Use: Never used  Substance and Sexual Activity  .  Alcohol use: No  . Drug use: No  . Sexual activity: Not Currently  Other Topics Concern  . Not on file  Social History Narrative  . Not on file   Social Determinants of Health   Financial Resource Strain:   . Difficulty of Paying Living Expenses:   Food Insecurity:   . Worried About Charity fundraiser in the Last Year:   . Arboriculturist in the Last Year:   Transportation Needs:   . Film/video editor (Medical):   Marland Kitchen Lack of Transportation (Non-Medical):   Physical Activity:   . Days of Exercise per Week:   . Minutes of Exercise per Session:   Stress:   . Feeling of Stress :   Social Connections:   . Frequency of Communication with Friends and Family:   . Frequency of Social Gatherings with Friends and Family:   . Attends Religious Services:   . Active Member of Clubs or Organizations:   . Attends Archivist Meetings:   Marland Kitchen Marital Status:   Intimate Partner Violence:   . Fear of Current or Ex-Partner:   . Emotionally Abused:   Marland Kitchen Physically Abused:   . Sexually Abused:     FAMILY HISTORY:  Family History  Problem  Relation Age of Onset  . Breast cancer Mother   . Thyroid disease Mother   . Heart disease Mother   . Heart attack Father   . Heart attack Sister   . Hypertension Sister   . Heart attack Brother   . Cancer Sister   . Stroke Brother   . Alzheimer's disease Maternal Aunt     CURRENT MEDICATIONS:  Outpatient Encounter Medications as of 10/24/2019  Medication Sig  . amLODipine (NORVASC) 5 MG tablet Take 1 tablet (5 mg total) by mouth daily.  Marland Kitchen anastrozole (ARIMIDEX) 1 MG tablet Take 1 tablet by mouth once daily  . calcium carbonate (CALCIUM 600) 600 MG TABS tablet Take 600 mg by mouth daily with breakfast.  . cholecalciferol (VITAMIN D3) 25 MCG (1000 UT) tablet Take 1,000 Units by mouth 2 (two) times daily.   . ciprofloxacin (CIPRO) 500 MG tablet Take 1 tablet (500 mg total) by mouth 2 (two) times daily for 5 days.  . metoprolol tartrate  (LOPRESSOR) 50 MG tablet Take 50 mg by mouth 2 (two) times daily.  . Omega-3 Fatty Acids (FISH OIL) 1000 MG CAPS Take 1 capsule by mouth 2 (two) times daily.  . ondansetron (ZOFRAN) 4 MG tablet Take 1 tablet (4 mg total) by mouth every 8 (eight) hours as needed for nausea or vomiting. (Patient not taking: Reported on 10/24/2019)   No facility-administered encounter medications on file as of 10/24/2019.    ALLERGIES:  No Known Allergies   PHYSICAL EXAM:  ECOG Performance status: 1  Vitals:   10/24/19 1054  BP: (!) 165/71  Pulse: 70  Resp: 18  Temp: 98.7 F (37.1 C)  SpO2: 98%   Filed Weights   10/24/19 1054  Weight: 161 lb (73 kg)   Physical Exam Constitutional:      Appearance: Normal appearance. She is normal weight.  Cardiovascular:     Rate and Rhythm: Normal rate and regular rhythm.     Heart sounds: Normal heart sounds.  Pulmonary:     Effort: Pulmonary effort is normal.     Breath sounds: Normal breath sounds.  Abdominal:     General: Bowel sounds are normal.     Palpations: Abdomen is soft.  Musculoskeletal:        General: Normal range of motion.  Skin:    General: Skin is warm.  Neurological:     Mental Status: She is alert and oriented to person, place, and time. Mental status is at baseline.  Psychiatric:        Mood and Affect: Mood normal.        Behavior: Behavior normal.        Thought Content: Thought content normal.        Judgment: Judgment normal.      LABORATORY DATA:  I have reviewed the labs as listed.  CBC    Component Value Date/Time   WBC 5.9 10/20/2019 1354   RBC 3.99 10/20/2019 1354   HGB 12.8 10/20/2019 1354   HCT 36.3 10/20/2019 1354   PLT 183 10/20/2019 1354   MCV 91.0 10/20/2019 1354   MCH 32.1 10/20/2019 1354   MCHC 35.3 10/20/2019 1354   RDW 11.6 10/20/2019 1354   LYMPHSABS 1.7 10/20/2019 1354   MONOABS 0.3 10/20/2019 1354   EOSABS 0.1 10/20/2019 1354   BASOSABS 0.0 10/20/2019 1354   CMP Latest Ref Rng & Units  10/20/2019 04/17/2019 03/04/2019  Glucose 70 - 99 mg/dL 525(HH) 253(H) 205(H)  BUN 8 - 23  mg/dL _0 Creatinine 0.44 - 1.00 mg/dL 1.06(H) 0.97 0.92  Sodium 135 - 145 mmol/L 132(L) 135 136  Potassium 3.5 - 5.1 mmol/L 4.5 4.3 4.5  Chloride 98 - 111 mmol/L 93(L) 100 104  CO2 22 - 32 mmol/L _1 Calcium 8.9 - 10.3 mg/dL 9.9 9.5 9.6  Total Protein 6.5 - 8.1 g/dL 7.6 7.7 7.6  Total Bilirubin 0.3 - 1.2 mg/dL 0.6 0.4 0.5  Alkaline Phos 38 - 126 U/L 62 55 54  AST 15 - 41 U/L 78(H) 38 44(H)  ALT 0 - 44 U/L 33 27 36    All questions were answered to patient's stated satisfaction. Encouraged patient to call with any new concerns or questions before his next visit to the cancer center and we can certain see him sooner, if needed.     ASSESSMENT & PLAN:  Breast cancer of upper-outer quadrant of left female breast ([place]) 1.  Poorly differentiated left breast cancer stage IIIb: - On 06/12/2017 she had a left breast biopsy with sarcomatoid features, left axillary lymph node biopsy was negative. - Her 2D echo showed EF 55 to 60% - CT of the chest showed subcarinal adenopathy. - PET CT scan was negative for metastatic disease. -She had 4 cycles of neoadjuvant dose dense AC from 07/18/2017 through 09/03/2017. - She had 12 cycles of weekly paclitaxel from 09/17/2017 through 12/06/2017. -She underwent a left modified radical mastectomy on 12/31/2017. - The pathology revealed a 2.3 cm poorly differentiated invasive ductal carcinoma with sarcomatoid changes, resection margins negative, high-grade DCIS, no skin involvement, 0 out of 8 lymph nodes positive, negative for lymphovascular or perineural invasion.  ER positive, PR and HER-2 negative.  Pathological staging is YpT2YpN0. - Her receptors were rechecked on a mastectomy specimen and HER-2 was positive by IHC.  This was initially negative by [place] on biopsy specimen.  ER was negative on mastectomy specimen.  It was 60% positive on biopsy specimen.  Ki 67 has  decreased from 30% to 80% on biopsy.  This was reviewed by a second pathologist and confirmed. - It was recommended she have 1 year of Herceptin. Pertuzumab was not added secondary to lymph node negativity.  - Echo dated 01/21/2018 showed EF 55 to 60%. -She started on Herceptin 02/07/2018.  She does report occasional diarrhea but no other side effects. - Anastrozole 1 mg started on 02/27/2018.  She is tolerating it very well. -Her physical exam today showed mastectomy site within normal limits.  Right breast has no palpable masses or adenopathy. -She completed her Herceptin treatment on 03/04/2019. -Echo on 01/04/2019 showed EF of 55 to 60%. -She will be scheduled for her next mammogram after 06/02/2019. -Labs on 10/20/2019 were all WNL. -She will continue her anastrozole at this time.  She is tolerating it well. -We will see her back in 6 months with repeat labs and mammogram  2.  Osteopenia: - Last DEXA scan on 03/07/2018 showed T score of -1.8. -She is taking calcium and vitamin D twice daily. -We will repeat DEXA scan in 2 years.      Orders placed this encounter:  Orders Placed This Encounter  Procedures  . MM 3D SCREEN BREAST UNI RIGHT  . Lactate dehydrogenase  . CBC with Differential/Platelet  . Comprehensive metabolic panel  . Vitamin B12  . VITAMIN D 25 Hydroxy (Vit-D Deficiency, Fractures)      Francene Finders, FNP-C [place] [phone]

## 2019-10-24 NOTE — Assessment & Plan Note (Signed)
1.  Poorly differentiated left breast cancer stage IIIb: - On 06/12/2017 she had a left breast biopsy with sarcomatoid features, left axillary lymph node biopsy was negative. - Her 2D echo showed EF 55 to 60% - CT of the chest showed subcarinal adenopathy. - PET CT scan was negative for metastatic disease. -She had 4 cycles of neoadjuvant dose dense AC from 07/18/2017 through 09/03/2017. - She had 12 cycles of weekly paclitaxel from 09/17/2017 through 12/06/2017. -She underwent a left modified radical mastectomy on 12/31/2017. - The pathology revealed a 2.3 cm poorly differentiated invasive ductal carcinoma with sarcomatoid changes, resection margins negative, high-grade DCIS, no skin involvement, 0 out of 8 lymph nodes positive, negative for lymphovascular or perineural invasion.  ER positive, PR and HER-2 negative.  Pathological staging is YpT2YpN0. - Her receptors were rechecked on a mastectomy specimen and HER-2 was positive by IHC.  This was initially negative by [place] on biopsy specimen.  ER was negative on mastectomy specimen.  It was 60% positive on biopsy specimen.  Ki 67 has decreased from 30% to 80% on biopsy.  This was reviewed by a second pathologist and confirmed. - It was recommended she have 1 year of Herceptin. Pertuzumab was not added secondary to lymph node negativity.  - Echo dated 01/21/2018 showed EF 55 to 60%. -She started on Herceptin 02/07/2018.  She does report occasional diarrhea but no other side effects. - Anastrozole 1 mg started on 02/27/2018.  She is tolerating it very well. -Her physical exam today showed mastectomy site within normal limits.  Right breast has no palpable masses or adenopathy. -She completed her Herceptin treatment on 03/04/2019. -Echo on 01/04/2019 showed EF of 55 to 60%. -She will be scheduled for her next mammogram after 06/02/2019. -Labs on 10/20/2019 were all WNL. -She will continue her anastrozole at this time.  She is tolerating it well. -We will see her  back in 6 months with repeat labs and mammogram  2.  Osteopenia: - Last DEXA scan on 03/07/2018 showed T score of -1.8. -She is taking calcium and vitamin D twice daily. -We will repeat DEXA scan in 2 years.

## 2019-10-29 ENCOUNTER — Encounter: Payer: Self-pay | Admitting: Family Medicine

## 2019-10-29 ENCOUNTER — Ambulatory Visit (INDEPENDENT_AMBULATORY_CARE_PROVIDER_SITE_OTHER): Payer: Medicare Other | Admitting: Family Medicine

## 2019-10-29 VITALS — BP 177/85 | HR 77 | Resp 16 | Ht 64.0 in | Wt 159.1 lb

## 2019-10-29 DIAGNOSIS — Z9012 Acquired absence of left breast and nipple: Secondary | ICD-10-CM | POA: Diagnosis not present

## 2019-10-29 DIAGNOSIS — E1169 Type 2 diabetes mellitus with other specified complication: Secondary | ICD-10-CM | POA: Insufficient documentation

## 2019-10-29 DIAGNOSIS — C50412 Malignant neoplasm of upper-outer quadrant of left female breast: Secondary | ICD-10-CM

## 2019-10-29 DIAGNOSIS — I10 Essential (primary) hypertension: Secondary | ICD-10-CM | POA: Diagnosis not present

## 2019-10-29 DIAGNOSIS — Z170 Estrogen receptor positive status [ER+]: Secondary | ICD-10-CM

## 2019-10-29 DIAGNOSIS — E1140 Type 2 diabetes mellitus with diabetic neuropathy, unspecified: Secondary | ICD-10-CM | POA: Insufficient documentation

## 2019-10-29 DIAGNOSIS — E1165 Type 2 diabetes mellitus with hyperglycemia: Secondary | ICD-10-CM

## 2019-10-29 LAB — POCT GLYCOSYLATED HEMOGLOBIN (HGB A1C)
HbA1c POC (<> result, manual entry): 11.7 % (ref 4.0–5.6)
HbA1c, POC (controlled diabetic range): 11.7 % — AB (ref 0.0–7.0)
HbA1c, POC (prediabetic range): 11.7 % — AB (ref 5.7–6.4)
Hemoglobin A1C: 11.7 % — AB (ref 4.0–5.6)

## 2019-10-29 MED ORDER — BLOOD GLUCOSE METER KIT: PACK | 0 refills | Status: AC

## 2019-10-29 MED ORDER — HYDROCHLOROTHIAZIDE 25 MG PO TABS: 25.0000 mg | ORAL_TABLET | Freq: Every day | ORAL | 2 refills | Status: DC

## 2019-10-29 MED ORDER — METFORMIN HCL 500 MG PO TABS: 500.0000 mg | ORAL_TABLET | Freq: Two times a day (BID) | ORAL | 3 refills | Status: DC

## 2019-10-29 NOTE — Assessment & Plan Note (Signed)
Close follow-up with oncology.

## 2019-10-29 NOTE — Assessment & Plan Note (Signed)
Blood pressure not well controlled, will start hydrochlorothiazide in combination with the Norvasc.  Encouraged DASH diet and exercise as tolerated.  Close follow-up in a few weeks.

## 2019-10-29 NOTE — Patient Instructions (Addendum)
I appreciate the opportunity to provide you with care for your health and wellness. Today we discussed: established care  Follow up: 8 weeks for BP (post fluid medication start)  No labs or referrals today  GREAT TO MEET YOU TODAY!!  Congratulations on how far you have come with your cancer treatments.  Please continue to practice social distancing to keep you, your family, and our community safe.  If you must go out, please wear a mask and practice good handwashing.  It was a pleasure to see you and I look forward to continuing to work together on your health and well-being. Please do not hesitate to call the office if you need care or have questions about your care.  Have a wonderful day and week. With Gratitude, Cherly Beach, DNP, AGNP-BC

## 2019-10-29 NOTE — Assessment & Plan Note (Signed)
Type 2 diabetes, A1c 11.7% in the office today.  We will start her on Metformin low-dose twice daily.  To see how she tolerates it.  Encouraged changes in diet and exercising as needed.  She is not currently on a statin.  We will get updated labs at next appointment.  And will start medication adjustments as needed to help keep her blood pressure better controlled, statin therapy, kidney health

## 2019-10-29 NOTE — Assessment & Plan Note (Signed)
Healed doing well still followed by oncology.

## 2019-10-29 NOTE — Progress Notes (Signed)
Subjective:  Patient ID: Lisa Thornton, female    DOB: June 28, 1955  Age: 64 y.o. MRN: [id number]  CC:  Chief Complaint  Patient presents with  . New Patient (Initial Visit)    establish care      HPI  HPI   Lisa Thornton is a 64 year old female patient who presents today to establish care [facility].   She is followed by the oncology department at [facility] secondary to breast cancer.  She is nearing her ongoing treatment at this time chemotherapy has ended.  She did have a left mastectomy.  Overall doing well and does not have any complaints regarding that.  She has a history that includes but is not limited to the breast cancer stated above, unspecified atrial fibrillation, anemia, hypertension, overweight.  She is due for Pap smear, willing to do Cologuard, mammogram up-to-date.  Does not recall having pneumonia vaccines.  Is worried about getting the Covid vaccine.  Usually does her flu vaccines.  She denies having any trouble sleeping.  Denies having any trouble with chewing or swallowing.  Does report missing teeth.  Does not go to the dentist.  Denies having any changes in bowel or bladder habits.  Denies having any blood in urine or stool.  Denies having any memory issues however she reports that she is always had memory issues since she was in anger and that was, made worse by chemo.  She denies have any falls or injuries.  She denies having any skin issues.  She denies having any hearing changes.  She does report some vision changes and needs to get back appointment.  She denies of any headaches, dizziness, chest pain, heart rate changes or palpitations, shortness of breath.  She reports some leg swelling post chemo.  She reports a cough that is ongoing.  But she also reports postnasal drip and heartburn.  Both of not being treated at this time.  Additionally she reports that she was told that her blood sugars were very elevated and that she possibly might have diabetes  and that her primary care should check her out.  She is able to get updated labs in the near future and willing to have A1c checked today in the office.  Today patient denies signs and symptoms of COVID 19 infection including fever, chills, cough, shortness of breath, and headache. Past Medical, Surgical, Social History, Allergies, and Medications have been Reviewed.   Past Medical History:  Diagnosis Date  . Anemia 12/31/2017  . Cancer [facility])    left breast cancer  . Chest pain   . Hypertension     Current Meds  Medication Sig  . amLODipine (NORVASC) 5 MG tablet Take 1 tablet (5 mg total) by mouth daily.  Marland Kitchen anastrozole (ARIMIDEX) 1 MG tablet Take 1 tablet by mouth once daily  . calcium carbonate (CALCIUM 600) 600 MG TABS tablet Take 600 mg by mouth daily with breakfast.  . cholecalciferol (VITAMIN D3) 25 MCG (1000 UT) tablet Take 1,000 Units by mouth 2 (two) times daily.   . Cyanocobalamin (VITAMIN B 12 PO) Take 1 tablet by mouth daily.  . metoprolol tartrate (LOPRESSOR) 50 MG tablet Take 50 mg by mouth 2 (two) times daily.  . Omega-3 Fatty Acids (FISH OIL) 1000 MG CAPS Take 1 capsule by mouth 2 (two) times daily.  . ondansetron (ZOFRAN) 4 MG tablet Take 1 tablet (4 mg total) by mouth every 8 (eight) hours as needed for nausea or vomiting.  ROS:  Review of Systems  Constitutional: Negative.   HENT: Negative.   Eyes: Negative.   Respiratory: Negative.   Cardiovascular: Positive for leg swelling.  Gastrointestinal: Negative.   Genitourinary: Negative.   Musculoskeletal: Negative.   Skin: Negative.   Neurological: Negative.   Endo/Heme/Allergies: Negative.   Psychiatric/Behavioral: Negative.   All other systems reviewed and are negative.    Objective:   Today's Vitals: BP (!) 177/85   Pulse 77   Resp 16   Ht _0  (1.626 m)   Wt 159 lb 1.3 oz (72.2 kg)   SpO2 96%   BMI 27.31 kg/m  Vitals with BMI 10/29/2019 10/24/2019 06/17/2019  Height _1  - -  Weight 159 lbs 1  oz 161 lbs 169 lbs 6 oz  BMI 70.26 - -  Systolic 378 588 502  Diastolic 85 71 68  Pulse 77 70 73     Physical Exam Vitals and nursing note reviewed.  Constitutional:      Appearance: Normal appearance. She is well-developed, well-groomed and overweight.  HENT:     Head: Normocephalic and atraumatic.     Right Ear: External ear normal.     Left Ear: External ear normal.     Mouth/Throat:     Comments: Mask in place  Eyes:     General:        Right eye: No discharge.        Left eye: No discharge.     Conjunctiva/sclera: Conjunctivae normal.  Cardiovascular:     Rate and Rhythm: Normal rate and regular rhythm.     Pulses: Normal pulses.     Heart sounds: Normal heart sounds.  Pulmonary:     Effort: Pulmonary effort is normal.     Breath sounds: Normal breath sounds.  Musculoskeletal:        General: Normal range of motion.     Cervical back: Normal range of motion and neck supple.  Skin:    General: Skin is warm.  Neurological:     General: No focal deficit present.     Mental Status: She is alert and oriented to person, place, and time.  Psychiatric:        Attention and Perception: Attention normal.        Mood and Affect: Mood normal.        Speech: Speech normal.        Behavior: Behavior normal. Behavior is cooperative.        Thought Content: Thought content normal.        Cognition and Memory: Cognition normal.        Judgment: Judgment normal.      Assessment   1. Type 2 diabetes mellitus with hyperglycemia, without long-term current use of insulin ([place])   2. Essential hypertension   3. S/P mastectomy, left   4. Malignant neoplasm of upper-outer quadrant of left breast in female, estrogen receptor positive ([place])     Tests ordered Orders Placed This Encounter  Procedures  . POCT glycosylated hemoglobin (Hb A1C)     Plan: Please see assessment and plan per problem list above.   Meds ordered this encounter  Medications  . hydrochlorothiazide  (HYDRODIURIL) 25 MG tablet    Sig: Take 1 tablet (25 mg total) by mouth daily.    Dispense:  30 tablet    Refill:  2    Order Specific Question:   Supervising Provider    Answer:   SIMPSON, MARGARET E [[id number]]  .  blood glucose meter kit and supplies    Sig: Dispense based on patient and insurance preference. Use up to four times daily as directed. (FOR ICD-10 E10.9, E11.9).    Dispense:  1 each    Refill:  0    Order Specific Question:   Number of strips    Answer:   100    Order Specific Question:   Number of lancets    Answer:   100  . metFORMIN (GLUCOPHAGE) 500 MG tablet    Sig: Take 1 tablet (500 mg total) by mouth 2 (two) times daily with a meal.    Dispense:  60 tablet    Refill:  3    Order Specific Question:   Supervising Provider    Answer:   Jacklynn Bue    Patient to follow-up in 12/24/2019  Perlie Mayo, NP

## 2019-11-17 ENCOUNTER — Telehealth: Payer: Self-pay

## 2019-11-17 ENCOUNTER — Other Ambulatory Visit: Payer: Self-pay

## 2019-11-17 MED ORDER — METOPROLOL TARTRATE 50 MG PO TABS: 50.0000 mg | ORAL_TABLET | Freq: Two times a day (BID) | ORAL | 1 refills | Status: DC

## 2019-11-17 NOTE — Telephone Encounter (Signed)
Requested medication refilled

## 2019-11-17 NOTE — Telephone Encounter (Signed)
Pt needing refill on metoprolol

## 2019-11-21 ENCOUNTER — Other Ambulatory Visit: Payer: Self-pay

## 2019-11-21 DIAGNOSIS — E1165 Type 2 diabetes mellitus with hyperglycemia: Secondary | ICD-10-CM

## 2019-11-21 MED ORDER — GLUCOSE BLOOD VI STRP: 1.0000 | ORAL_STRIP | Freq: Four times a day (QID) | 2 refills | Status: DC

## 2019-12-24 ENCOUNTER — Ambulatory Visit (INDEPENDENT_AMBULATORY_CARE_PROVIDER_SITE_OTHER): Payer: Medicare Other | Admitting: Family Medicine

## 2019-12-24 ENCOUNTER — Encounter: Payer: Self-pay | Admitting: Family Medicine

## 2019-12-24 ENCOUNTER — Other Ambulatory Visit: Payer: Self-pay

## 2019-12-24 VITALS — BP 144/79 | HR 82 | Temp 97.8°F | Resp 18 | Ht 64.0 in | Wt 151.1 lb

## 2019-12-24 DIAGNOSIS — E1165 Type 2 diabetes mellitus with hyperglycemia: Secondary | ICD-10-CM | POA: Diagnosis not present

## 2019-12-24 DIAGNOSIS — Z23 Encounter for immunization: Secondary | ICD-10-CM

## 2019-12-24 DIAGNOSIS — N3000 Acute cystitis without hematuria: Secondary | ICD-10-CM | POA: Diagnosis not present

## 2019-12-24 LAB — POCT URINALYSIS DIP (CLINITEK)
Bilirubin, UA: NEGATIVE
Glucose, UA: NEGATIVE mg/dL
Ketones, POC UA: NEGATIVE mg/dL
Nitrite, UA: NEGATIVE
POC PROTEIN,UA: 30 — AB
Spec Grav, UA: 1.025 (ref 1.010–1.025)
Urobilinogen, UA: 0.2 E.U./dL
pH, UA: 5 (ref 5.0–8.0)

## 2019-12-24 MED ORDER — SULFAMETHOXAZOLE-TRIMETHOPRIM 800-160 MG PO TABS: 1.0000 | ORAL_TABLET | Freq: Two times a day (BID) | ORAL | 0 refills | Status: AC

## 2019-12-24 MED ORDER — METFORMIN HCL 1000 MG PO TABS: 1000.0000 mg | ORAL_TABLET | Freq: Two times a day (BID) | ORAL | 2 refills | Status: DC

## 2019-12-24 NOTE — Assessment & Plan Note (Signed)
Bactrim sent in for positive UTI send out for culture to make sure appropriate treatment is provided.

## 2019-12-24 NOTE — Patient Instructions (Addendum)
I appreciate the opportunity to provide you with care for your health and wellness. Today we discussed: BP, DM, and UTI  Follow up: 3 months A1c in office check  No labs  Referrals today: Dietician to help with food plans  Medication sent in for UTI Increased dose of Metformin  Continue BP medications as directed  Flu shot today- tylenol for sore arm as needed  Please continue to practice social distancing to keep you, your family, and our community safe.  If you must go out, please wear a mask and practice good handwashing.  It was a pleasure to see you and I look forward to continuing to work together on your health and well-being. Please do not hesitate to call the office if you need care or have questions about your care.  Have a wonderful day and week. With Gratitude, Cherly Beach, DNP, AGNP-BC

## 2019-12-24 NOTE — Assessment & Plan Note (Signed)

## 2019-12-24 NOTE — Assessment & Plan Note (Signed)
Increase Metformin to 1000 mg twice daily.  She is tolerating it well.  Encouraged her to visit with the dietitian that I sent her to so that she can figure out her diet plan.  And encouraged her to exercise as much as she can tolerate.

## 2019-12-24 NOTE — Progress Notes (Signed)
Subjective:  Patient ID: Lisa Thornton, female    DOB: 1956-01-06  Age: 64 y.o. MRN: [id number]  CC:  Chief Complaint  Patient presents with  . Follow-up    8 week follow up pt is having burning and frequent urination has been going on all week       HPI  HPI  [name] is a 64 year old female patient of mine.  She presents today for follow-up on blood pressure after having blood pressure medication adjustment.  In addition to this she reports that she started having burning and frequency of urination on Monday.  She denies having any blood in her urine.  She denies having any excessive back pain or flank pain or pelvic pressure.  She denies having nausea or vomiting or chills or fevers.  She reports that her blood pressures overall are doing well.  She does not check her blood pressures at home.  But she can tell that she is feeling better overall with them.  She denies having headaches, dizziness, chest pain, shortness of breath, palpitations, cough, vision changes.  Blood sugars that she has been taking fasting have ranged from 1 40-200.  Predominantly in the uncontrolled region.  She is taking her Metformin 500 mg twice daily.  She is willing to have me do an adjustment of her medications.  And she would like to do a referral for dietitian to help her figure out what she can eat safely.  Today patient denies signs and symptoms of COVID 19 infection including fever, chills, cough, shortness of breath, and headache. Past Medical, Surgical, Social History, Allergies, and Medications have been Reviewed.   Past Medical History:  Diagnosis Date  . Anemia 12/31/2017  . Cancer [facility])    left breast cancer  . Chest pain   . Hypertension     Current Meds  Medication Sig  . amLODipine (NORVASC) 5 MG tablet Take 1 tablet (5 mg total) by mouth daily.  Marland Kitchen anastrozole (ARIMIDEX) 1 MG tablet Take 1 tablet by mouth once daily  . blood glucose meter kit and supplies Dispense based on patient  and insurance preference. Use up to four times daily as directed. (FOR ICD-10 E10.9, E11.9).  . calcium carbonate (CALCIUM 600) 600 MG TABS tablet Take 600 mg by mouth daily with breakfast.  . cholecalciferol (VITAMIN D3) 25 MCG (1000 UT) tablet Take 1,000 Units by mouth 2 (two) times daily.   . Cyanocobalamin (VITAMIN B 12 PO) Take 1 tablet by mouth daily.  Marland Kitchen glucose blood test strip 1 each by Other route in the morning, at noon, in the evening, and at bedtime. Use as instructed  . hydrochlorothiazide (HYDRODIURIL) 25 MG tablet Take 1 tablet (25 mg total) by mouth daily.  . metFORMIN (GLUCOPHAGE) 1000 MG tablet Take 1 tablet (1,000 mg total) by mouth 2 (two) times daily with a meal.  . metoprolol tartrate (LOPRESSOR) 50 MG tablet Take 1 tablet (50 mg total) by mouth 2 (two) times daily.  . Omega-3 Fatty Acids (FISH OIL) 1000 MG CAPS Take 1 capsule by mouth 2 (two) times daily.  . ondansetron (ZOFRAN) 4 MG tablet Take 1 tablet (4 mg total) by mouth every 8 (eight) hours as needed for nausea or vomiting.  . [DISCONTINUED] metFORMIN (GLUCOPHAGE) 500 MG tablet Take 1 tablet (500 mg total) by mouth 2 (two) times daily with a meal.    ROS:  Review of Systems  HENT: Negative.   Eyes: Negative.   Respiratory:  Negative.   Cardiovascular: Negative.   Gastrointestinal: Negative.   Genitourinary: Positive for dysuria, frequency and urgency. Negative for flank pain and hematuria.  Musculoskeletal: Negative.  Negative for back pain.  Skin: Negative.   Neurological: Negative.   Endo/Heme/Allergies: Negative.   Psychiatric/Behavioral: Negative.      Objective:   Today's Vitals: BP (!) 144/79 (BP Location: Right Arm, Patient Position: Sitting, Cuff Size: Normal)   Pulse 82   Temp 97.8 F (36.6 C) (Temporal)   Resp 18   Ht _0  (1.626 m)   Wt 151 lb 1.9 oz (68.5 kg)   SpO2 93%   BMI 25.94 kg/m  Vitals with BMI 12/24/2019 10/29/2019 10/24/2019  Height _1  _2  -  Weight 151 lbs 2 oz 159 lbs  1 oz 161 lbs  BMI 47.82 95.62 -  Systolic 130 865 784  Diastolic 79 85 71  Pulse 82 77 70     Physical Exam Vitals and nursing note reviewed.  Constitutional:      Appearance: Normal appearance. She is well-developed, well-groomed and overweight.  HENT:     Head: Normocephalic and atraumatic.     Right Ear: External ear normal.     Left Ear: External ear normal.     Mouth/Throat:     Comments: Mask in place  Eyes:     General:        Right eye: No discharge.        Left eye: No discharge.     Conjunctiva/sclera: Conjunctivae normal.  Cardiovascular:     Rate and Rhythm: Normal rate and regular rhythm.     Pulses: Normal pulses.     Heart sounds: Normal heart sounds.  Pulmonary:     Effort: Pulmonary effort is normal.     Breath sounds: Normal breath sounds.  Abdominal:     Tenderness: There is no right CVA tenderness or left CVA tenderness.  Musculoskeletal:        General: Normal range of motion.     Cervical back: Normal range of motion and neck supple.  Skin:    General: Skin is warm.  Neurological:     General: No focal deficit present.     Mental Status: She is alert and oriented to person, place, and time.  Psychiatric:        Attention and Perception: Attention normal.        Mood and Affect: Mood normal.        Speech: Speech normal.        Behavior: Behavior normal. Behavior is cooperative.        Thought Content: Thought content normal.        Cognition and Memory: Cognition normal.        Judgment: Judgment normal.    Assessment   1. Type 2 diabetes mellitus with hyperglycemia, without long-term current use of insulin ([place])   2. Acute cystitis without hematuria   3. Need for immunization against influenza     Tests ordered Orders Placed This Encounter  Procedures  . Urine Culture  . Flu Vaccine QUAD High Dose(Fluad)  . Amb Referral to Nutrition and Diabetic E  . POCT URINALYSIS DIP (CLINITEK)     Plan: Please see assessment and plan per  problem list above.   Meds ordered this encounter  Medications  . metFORMIN (GLUCOPHAGE) 1000 MG tablet    Sig: Take 1 tablet (1,000 mg total) by mouth 2 (two) times daily with a meal.  Dispense:  60 tablet    Refill:  2    Order Specific Question:   Supervising Provider    Answer:   SIMPSON, MARGARET E [[id number]]  . sulfamethoxazole-trimethoprim (BACTRIM DS) 800-160 MG tablet    Sig: Take 1 tablet by mouth 2 (two) times daily for 3 days.    Dispense:  6 tablet    Refill:  0    Order Specific Question:   Supervising Provider    Answer:   Fayrene Helper [[id number]]    Patient to follow-up in 3 months A1c in office check  Perlie Mayo, NP

## 2019-12-25 ENCOUNTER — Other Ambulatory Visit (HOSPITAL_COMMUNITY)
Admission: RE | Admit: 2019-12-25 | Discharge: 2019-12-25 | Disposition: A | Discharge status: Home / Self Care | Payer: Medicare Other | Source: Other Acute Inpatient Hospital | Attending: Family Medicine | Admitting: Family Medicine

## 2019-12-25 DIAGNOSIS — N3000 Acute cystitis without hematuria: Secondary | ICD-10-CM | POA: Diagnosis not present

## 2019-12-28 LAB — URINE CULTURE: Culture: >=100000 — AB

## 2020-01-01 ENCOUNTER — Other Ambulatory Visit: Payer: Self-pay | Admitting: Family Medicine

## 2020-01-01 DIAGNOSIS — N3000 Acute cystitis without hematuria: Secondary | ICD-10-CM

## 2020-01-01 MED ORDER — NITROFURANTOIN MONOHYD MACRO 100 MG PO CAPS: 100.0000 mg | ORAL_CAPSULE | Freq: Two times a day (BID) | ORAL | 0 refills | Status: AC

## 2020-01-03 ENCOUNTER — Other Ambulatory Visit (HOSPITAL_COMMUNITY): Payer: Self-pay | Admitting: Hematology

## 2020-01-03 DIAGNOSIS — Z170 Estrogen receptor positive status [ER+]: Secondary | ICD-10-CM

## 2020-01-19 ENCOUNTER — Other Ambulatory Visit: Payer: Self-pay | Admitting: Family Medicine

## 2020-01-26 ENCOUNTER — Other Ambulatory Visit: Payer: Self-pay

## 2020-01-26 MED ORDER — METOPROLOL TARTRATE 50 MG PO TABS
50.0000 mg | ORAL_TABLET | Freq: Two times a day (BID) | ORAL | 1 refills | Status: DC
Start: 2020-01-26 — End: 2020-04-12

## 2020-02-06 ENCOUNTER — Other Ambulatory Visit: Payer: Self-pay

## 2020-02-06 DIAGNOSIS — I10 Essential (primary) hypertension: Secondary | ICD-10-CM

## 2020-02-06 MED ORDER — HYDROCHLOROTHIAZIDE 25 MG PO TABS: 25.0000 mg | ORAL_TABLET | Freq: Every day | ORAL | 2 refills | Status: DC

## 2020-02-13 ENCOUNTER — Ambulatory Visit: Payer: Medicare Other | Admitting: Dietician

## 2020-02-19 ENCOUNTER — Other Ambulatory Visit: Payer: Self-pay

## 2020-02-19 DIAGNOSIS — I10 Essential (primary) hypertension: Secondary | ICD-10-CM

## 2020-02-19 MED ORDER — AMLODIPINE BESYLATE 5 MG PO TABS: 5.0000 mg | ORAL_TABLET | Freq: Every day | ORAL | 6 refills | Status: DC

## 2020-03-14 ENCOUNTER — Other Ambulatory Visit: Payer: Self-pay | Admitting: Family Medicine

## 2020-03-14 DIAGNOSIS — E1165 Type 2 diabetes mellitus with hyperglycemia: Secondary | ICD-10-CM

## 2020-03-24 ENCOUNTER — Other Ambulatory Visit: Payer: Self-pay

## 2020-03-24 ENCOUNTER — Ambulatory Visit (INDEPENDENT_AMBULATORY_CARE_PROVIDER_SITE_OTHER): Payer: Medicare Other | Admitting: Family Medicine

## 2020-03-24 ENCOUNTER — Encounter: Payer: Self-pay | Admitting: Family Medicine

## 2020-03-24 VITALS — BP 140/78 | HR 81 | Temp 97.5°F | Ht 64.0 in | Wt 150.0 lb

## 2020-03-24 DIAGNOSIS — I10 Essential (primary) hypertension: Secondary | ICD-10-CM

## 2020-03-24 DIAGNOSIS — E1165 Type 2 diabetes mellitus with hyperglycemia: Secondary | ICD-10-CM | POA: Diagnosis not present

## 2020-03-24 LAB — POCT GLYCOSYLATED HEMOGLOBIN (HGB A1C): Hemoglobin A1C: 6.1 % — AB (ref 4.0–5.6)

## 2020-03-24 NOTE — Assessment & Plan Note (Signed)
BP is high end of control. Given DM- would benefit from ACE combo with HCTZ. Will get labs. Discussed possible change in BP meds she is ok with this. DASH diet that is DM friendly recommended.

## 2020-03-24 NOTE — Progress Notes (Signed)
Subjective:  Patient ID: Lisa Thornton, female    DOB: 1955/10/13  Age: 64 y.o. MRN: [id number]  CC:  Chief Complaint  Patient presents with  . Diabetes    F/u      HPI  HPI   Lisa Thornton is a 64 year old female patient of mine.  She presents today for follow-up on diabetes.  Blood sugars that she has been taking fasting have ranged from 110-140, much improved.  She is taking her Metformin 1000 mg twice daily.  She has not really change her diet per her. She denies   having any polydipsia, polyuria, polyphagia.  Reports that she did notice an improvement in her blood sugars and how she felt.  She denies having any hypoglycemic events.  No blood sugars under 70.  Denies having any skin issues, foot ulcers or wounds.  And denies having any urinary tract like symptoms.  Is willing to get updated labs and adjustment of medication to possible ACE for blood pressure control and to start a statin for cholesterol control given that she is diabetic.  A1c checked today is 6.1% greatly improved from July 2011 0.7%.  She reports that her blood pressures overall are doing well.  She does not check her blood pressures at home.  But she can tell that she is feeling better overall with them.  She denies having headaches, dizziness, chest pain, shortness of breath, palpitations, cough, vision changes.   Today patient denies signs and symptoms of COVID 19 infection including fever, chills, cough, shortness of breath, and headache. Past Medical, Surgical, Social History, Allergies, and Medications have been Reviewed.   Past Medical History:  Diagnosis Date  . Anemia 12/31/2017  . Cancer [facility])    left breast cancer  . Chest pain   . Hypertension     Current Meds  Medication Sig  . amLODipine (NORVASC) 5 MG tablet Take 1 tablet (5 mg total) by mouth daily.  Marland Kitchen anastrozole (ARIMIDEX) 1 MG tablet Take 1 tablet by mouth once daily  . blood glucose meter kit and supplies Dispense based on patient  and insurance preference. Use up to four times daily as directed. (FOR ICD-10 E10.9, E11.9).  . calcium carbonate (OS-CAL) 600 MG TABS tablet Take 600 mg by mouth daily with breakfast.  . cholecalciferol (VITAMIN D3) 25 MCG (1000 UT) tablet Take 1,000 Units by mouth 2 (two) times daily.   . Cyanocobalamin (VITAMIN B 12 PO) Take 1 tablet by mouth daily.  Marland Kitchen glucose blood test strip 1 each by Other route in the morning, at noon, in the evening, and at bedtime. Use as instructed  . hydrochlorothiazide (HYDRODIURIL) 25 MG tablet Take 1 tablet (25 mg total) by mouth daily.  . metFORMIN (GLUCOPHAGE) 1000 MG tablet Take 1 tablet (1,000 mg total) by mouth 2 (two) times daily with a meal.  . metoprolol tartrate (LOPRESSOR) 50 MG tablet Take 1 tablet (50 mg total) by mouth 2 (two) times daily.  . Omega-3 Fatty Acids (FISH OIL) 1000 MG CAPS Take 1 capsule by mouth 2 (two) times daily.  . ondansetron (ZOFRAN) 4 MG tablet Take 1 tablet (4 mg total) by mouth every 8 (eight) hours as needed for nausea or vomiting.    ROS:  Review of Systems  Constitutional: Negative.   HENT: Negative.   Eyes: Negative.   Respiratory: Negative.   Cardiovascular: Negative.   Gastrointestinal: Negative.   Genitourinary: Negative.   Musculoskeletal: Negative.   Skin: Negative.  Neurological: Negative.   Endo/Heme/Allergies: Negative.   Psychiatric/Behavioral: Negative.      Objective:   Today's Vitals: BP 140/78 (BP Location: Right Arm, Patient Position: Sitting, Cuff Size: Normal)   Pulse 81   Temp (!) 97.5 F (36.4 C) (Tympanic)   Ht $R'5\' 4"'CE$  (1.626 m)   Wt 150 lb (68 kg)   SpO2 97%   BMI 25.75 kg/m  Vitals with BMI 03/24/2020 12/24/2019 10/29/2019  Height $Remov'5\' 4"'pbNqyS$  $Remove'5\' 4"'kqVcwNj$  $RemoveB'5\' 4"'qalWmpdx$   Weight 150 lbs 151 lbs 2 oz 159 lbs 1 oz  BMI 25.73 88.32 54.98  Systolic 264 158 309  Diastolic 78 79 85  Pulse 81 82 77     Physical Exam Vitals and nursing note reviewed.  Constitutional:      Appearance: Normal appearance. She  is well-developed, well-groomed and overweight.  HENT:     Head: Normocephalic and atraumatic.     Right Ear: External ear normal.     Left Ear: External ear normal.     Mouth/Throat:     Comments: Mask in place  Eyes:     General:        Right eye: No discharge.        Left eye: No discharge.     Conjunctiva/sclera: Conjunctivae normal.  Cardiovascular:     Rate and Rhythm: Normal rate and regular rhythm.     Pulses: Normal pulses.     Heart sounds: Normal heart sounds.  Pulmonary:     Effort: Pulmonary effort is normal.     Breath sounds: Normal breath sounds.  Musculoskeletal:        General: Normal range of motion.     Cervical back: Normal range of motion and neck supple.  Skin:    General: Skin is warm.  Neurological:     General: No focal deficit present.     Mental Status: She is alert and oriented to person, place, and time.  Psychiatric:        Attention and Perception: Attention normal.        Mood and Affect: Mood normal.        Speech: Speech normal.        Behavior: Behavior normal. Behavior is cooperative.        Thought Content: Thought content normal.        Cognition and Memory: Cognition normal.        Judgment: Judgment normal.      Assessment   1. Type 2 diabetes mellitus with hyperglycemia, without long-term current use of insulin ([place])   2. Primary hypertension     Tests ordered Orders Placed This Encounter  Procedures  . CBC  . Comprehensive metabolic panel  . Lipid panel  . Microalbumin / creatinine urine ratio  . POCT glycosylated hemoglobin (Hb A1C)     Plan: Please see assessment and plan per problem list above.   No orders of the defined types were placed in this encounter.   Patient to follow-up in July CPE    Perlie Mayo, NP

## 2020-03-24 NOTE — Patient Instructions (Signed)
  I appreciate the opportunity to provide you with care for your health and wellness. Today we discussed: diabetes and need for labs   Follow up: July for CPE- fasting   Labs- First week of January- fasting  None referrals today  GREAT JOB ON GETTING YOUR DIABETES UNDER CONTROL!  Congratulations on your next great grandbaby!  Merry Christmas and Happy New Year!  Please continue to practice social distancing to keep you, your family, and our community safe.  If you must go out, please wear a mask and practice good handwashing.  It was a pleasure to see you and I look forward to continuing to work together on your health and well-being. Please do not hesitate to call the office if you need care or have questions about your care.  Have a wonderful day. With Gratitude, Cherly Beach, DNP, AGNP-BC

## 2020-03-24 NOTE — Assessment & Plan Note (Signed)
Much improved, A1c is 6.1% Fasting numbers range from 110-140's. Tolerating meds well, will continue for now. Exercise is encouraged.  Updated labs ordered She needs to be on statin and ACE  Patient acknowledged agreement and understanding of the plan.

## 2020-03-25 ENCOUNTER — Encounter: Payer: Self-pay | Admitting: Family Medicine

## 2020-03-25 ENCOUNTER — Ambulatory Visit (INDEPENDENT_AMBULATORY_CARE_PROVIDER_SITE_OTHER): Payer: Medicare Other | Admitting: Family Medicine

## 2020-03-25 VITALS — BP 140/78 | Ht 64.0 in | Wt 150.0 lb

## 2020-03-25 DIAGNOSIS — Z0000 Encounter for general adult medical examination without abnormal findings: Secondary | ICD-10-CM

## 2020-03-25 MED ORDER — ROSUVASTATIN CALCIUM 5 MG PO TABS: 5.0000 mg | ORAL_TABLET | Freq: Every day | ORAL | 1 refills | Status: DC

## 2020-03-25 NOTE — Progress Notes (Addendum)
Subjective:   Lisa Thornton is a 64 y.o. female who presents for Medicare Annual (Subsequent) preventive examination.  Participants: Nurse for intake and work up; Patient and Provider for Visit and Wrap up  Method of visit: Telephone  Location of Patient: Home Location of Provider: Office Consent was obtain for visit over the telephone. Services rendered by provider: Visit was performed via telephone    I verified that I am speaking with the correct person using two identifiers.  Review of Systems Cardiac Risk Factors include: advanced age (>44mn, >>74women);diabetes mellitus     Objective:    Today's Vitals   03/25/20 1528 03/25/20 1529  BP: 140/78   Weight: 150 lb (68 kg)   Height: _0  (1.626 m)   PainSc: 0-No pain 0-No pain   Body mass index is 25.75 kg/m.  Advanced Directives 03/25/2020 10/24/2019 06/17/2019 04/17/2019 02/10/2019 12/23/2018 11/27/2018  Does Patient Have a Medical Advance Directive? _1  No No  Would patient like information on creating a medical advance directive? No - Patient declined No - Patient declined No - Patient declined No - Patient declined No - Patient declined No - Patient declined No - Patient declined    Current Medications (verified) Outpatient Encounter Medications as of 03/25/2020  Medication Sig  . amLODipine (NORVASC) 5 MG tablet Take 1 tablet (5 mg total) by mouth daily.  .Marland Kitchenanastrozole (ARIMIDEX) 1 MG tablet Take 1 tablet by mouth once daily  . blood glucose meter kit and supplies Dispense based on patient and insurance preference. Use up to four times daily as directed. (FOR ICD-10 E10.9, E11.9).  . calcium carbonate (OS-CAL) 600 MG TABS tablet Take 600 mg by mouth daily with breakfast.  . cholecalciferol (VITAMIN D3) 25 MCG (1000 UT) tablet Take 1,000 Units by mouth 2 (two) times daily.   . Cyanocobalamin (VITAMIN B 12 PO) Take 1 tablet by mouth daily.  .Marland Kitchenglucose blood test strip 1 each by Other route in the morning,  at noon, in the evening, and at bedtime. Use as instructed  . hydrochlorothiazide (HYDRODIURIL) 25 MG tablet Take 1 tablet (25 mg total) by mouth daily.  . metFORMIN (GLUCOPHAGE) 1000 MG tablet Take 1 tablet (1,000 mg total) by mouth 2 (two) times daily with a meal.  . metoprolol tartrate (LOPRESSOR) 50 MG tablet Take 1 tablet (50 mg total) by mouth 2 (two) times daily.  . Omega-3 Fatty Acids (FISH OIL) 1000 MG CAPS Take 1 capsule by mouth 2 (two) times daily.  . ondansetron (ZOFRAN) 4 MG tablet Take 1 tablet (4 mg total) by mouth every 8 (eight) hours as needed for nausea or vomiting.   No facility-administered encounter medications on file as of 03/25/2020.    Allergies (verified) Patient has no known allergies.   History: Past Medical History:  Diagnosis Date  . Anemia 12/31/2017  . Cancer ([facility]    left breast cancer  . Chest pain   . Hypertension    Past Surgical History:  Procedure Laterality Date  . MASTECTOMY MODIFIED RADICAL Left 12/31/2017   Procedure: LEFT MODIFIED RADICAL MASTECTOMY;  Surgeon: JAviva Signs MD;  Location: [facility];  Service: General;  Laterality: Left;  . PORTACATH PLACEMENT Right 06/27/2017   Procedure: INSERTION PORT-A-CATH;  Surgeon: JAviva Signs MD;  Location: [facility];  Service: General;  Laterality: Right;   Family History  Problem Relation Age of Onset  . Breast cancer Mother   . Thyroid disease Mother   .  Heart disease Mother   . Heart attack Father   . Heart attack Sister   . Hypertension Sister   . Heart attack Brother   . Cancer Sister   . Stroke Brother   . Alzheimer's disease Maternal Aunt    Social History   Socioeconomic History  . Marital status: Divorced    Spouse name: Not on file  . Number of children: 2  . Years of education: Not on file  . Highest education level: 10th grade  Occupational History  . Not on file  Tobacco Use  . Smoking status: Never Smoker  . Smokeless tobacco: Never Used  Vaping Use  . Vaping Use:  Never used  Substance and Sexual Activity  . Alcohol use: No  . Drug use: No  . Sexual activity: Not Currently  Other Topics Concern  . 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 area    Social Determinants of Health   Financial Resource Strain: Low Risk   . Difficulty of Paying Living Expenses: Not hard at all  Food Insecurity: No Food Insecurity  . Worried About Charity fundraiser in the Last Year: Never true  . Ran Out of Food in the Last Year: Never true  Transportation Needs: No Transportation Needs  . Lack of Transportation (Medical): No  . Lack of Transportation (Non-Medical): No  Physical Activity: Insufficiently Active  . Days of Exercise per Week: 3 days  . Minutes of Exercise per Session: 30 min  Stress: No Stress Concern Present  . Feeling of Stress : Not at all  Social Connections: Moderately Isolated  . Frequency of Communication with Friends and Family: More than three times a week  . Frequency of Social Gatherings with Friends and Family: Once a week  . Attends Religious Services: More than 4 times per year  . Active Member of Clubs or Organizations: No  . Attends Archivist Meetings: Never  . Marital Status: Divorced    Tobacco Counseling Counseling given: Yes   Clinical Intake:  Pre-visit preparation completed: Yes  Pain : No/denies pain Pain Score: 0-No pain     BMI - recorded: 25.73 Nutritional Status: BMI 25 -29 Overweight Nutritional Risks: None Diabetes: Yes CBG done?: No Did pt. bring in CBG monitor from home?: No  How often do you need to have someone help you when you read instructions, pamphlets, or other written materials from your doctor  or pharmacy?: 1 - Never What is the last grade level you completed in school?: 10  Diabetic? yes  Interpreter Needed?: No  Information entered by :: Laretta Bolster, LPN   Activities of Daily Living In your present state of health, do you have any difficulty performing the following activities: 03/25/2020 12/24/2019  Hearing? N N  Vision? N N  Difficulty concentrating or making decisions? N N  Walking or climbing stairs? N N  Dressing or bathing? N N  Doing errands, shopping? N -  Preparing Food and eating ? N -  Using the Toilet? N -  In the past six months, have you  accidently leaked urine? N -  Do you have problems with loss of bowel control? N -  Managing your Medications? N -  Managing your Finances? N -  Housekeeping or managing your Housekeeping? N -  Some recent data might be hidden    Patient Care Team: Perlie Mayo, NP as PCP - General (Family Medicine) [name], MD (Inactive) as PCP - Cardiology (Cardiology)  Indicate any recent Medical Services you may have received from other than [facility] providers in the past year (date may be approximate).     Assessment:   This is a routine wellness examination for Rima.  Hearing/Vision screen No exam data present  Dietary issues and exercise activities discussed: Current Exercise Habits: Home exercise routine, Time (Minutes): 10, Frequency (Times/Week): 3, Weekly Exercise (Minutes/Week): 30, Intensity: Mild  Goals    . DIET - INCREASE WATER INTAKE      Depression Screen PHQ 2/9 Scores 03/25/2020 03/24/2020 12/24/2019 10/29/2019  PHQ - 2 Score 0 0 0 0    Fall Risk Fall Risk  03/25/2020 03/24/2020 12/24/2019 10/29/2019  Falls in the past year? 0 0 0 1  Number falls in past yr: 0 0 0 1  Injury with Fall? 0 0 0 0  Risk for fall due to : No Fall Risks No Fall Risks No Fall Risks -  Follow up Falls evaluation completed Falls evaluation completed Falls evaluation completed -    FALL RISK PREVENTION  PERTAINING TO THE HOME:  Any stairs in or around the home? No  If so, are there any without handrails? n/a Home free of loose throw rugs in walkways, pet beds, electrical cords, etc? Yes  Adequate lighting in your home to reduce risk of falls? Yes   ASSISTIVE DEVICES UTILIZED TO PREVENT FALLS:  Life alert? No  Use of a cane, walker or w/c? No  Grab bars in the bathroom? Yes  Shower chair or bench in shower? No  Elevated toilet seat or a handicapped toilet? No   TIMED UP AND GO:  Was the test performed? No .  Length of time to ambulate n/a    Cognitive Function:     6CIT Screen 03/25/2020  What Year? 0 points  What month? 0 points  What time? 0 points  Count back from 20 0 points  Months in reverse 0 points  Repeat phrase 10 points  Total Score 10    Immunizations Immunization History  Administered Date(s) Administered  . Fluad Quad(high Dose 65+) 12/24/2019  . Influenza,inj,Quad PF,6+ Mos 01/15/2018, 01/13/2019    TDAP status: Up to date  Flu Vaccine status: Up to date  Pneumococcal vaccine status: Up to date  Covid-19 vaccine status: Declined, Education has been provided regarding the importance of this vaccine but patient still declined. Advised may receive this vaccine at local pharmacy or Health Dept.or vaccine clinic. Aware to provide a copy of the vaccination record if obtained from local pharmacy or Health Dept. Verbalized acceptance and understanding.  Qualifies for Shingles Vaccine? Yes   Zostavax completed No   Shingrix Completed?: No.    Education has been provided regarding the importance of this vaccine. Patient has been advised to call insurance company to determine out of pocket expense if they have not yet received this vaccine. Advised may also receive vaccine at local pharmacy or Health Dept. Verbalized acceptance and understanding.  Screening Tests Health Maintenance  Topic Date Due  . FOOT EXAM  Never done  . OPHTHALMOLOGY EXAM  Never  done   . URINE MICROALBUMIN  Never done  . COLONOSCOPY  Never done  . COVID-19 Vaccine (1) 04/09/2020 (Originally 03/02/1968)  . TETANUS/TDAP  03/24/2021 (Originally 03/02/1975)  . Hepatitis C Screening  03/24/2021 (Originally 1956-03-19)  . PNA vac Low Risk Adult (1 of 2 - PCV13) 03/24/2021 (Originally 03/03/2019)  . HEMOGLOBIN A1C  09/22/2020  . MAMMOGRAM  06/01/2021  . INFLUENZA VACCINE  Completed  . DEXA SCAN  Completed    Health Maintenance  Health Maintenance Due  Topic Date Due  . FOOT EXAM  Never done  . OPHTHALMOLOGY EXAM  Never done  . URINE MICROALBUMIN  Never done  . COLONOSCOPY  Never done    Colorectal cancer screening: Type of screening: Colonoscopy. Completed Declined at this time.. Repeat every 10 years  Mammogram status: Ordered scheduled for March. Pt provided with contact info and advised to call to schedule appt.   Bone Density status: Completed 03/07/18. Results reflect: Bone density results: NORMAL. Repeat every 5 years.  Lung Cancer Screening: (Low Dose CT Chest recommended if Age 74-80 years, 30 pack-year currently smoking OR have quit w/in 15years.) does not qualify.   Lung Cancer Screening Referral: n/a  Additional Screening:  Hepatitis C Screening: does not qualify  Vision Screening: Recommended annual ophthalmology exams for early detection of glaucoma and other disorders of the eye. Is the patient up to date with their annual eye exam?  No  Who is the provider or what is the name of the office in which the patient attends annual eye exams? Does not have one at this time. If pt is not established with a provider, would they like to be referred to a provider to establish care? No .   Dental Screening: Recommended annual dental exams for proper oral hygiene  Community Resource Referral / Chronic Care Management: CRR required this visit?  No   CCM required this visit?  No      Plan:      1. Encounter for Medicare annual wellness exam   I  have personally reviewed and noted the following in the patient's chart:   . Medical and social history . Use of alcohol, tobacco or illicit drugs  . Current medications and supplements . Functional ability and status . Nutritional status . Physical activity . Advanced directives . List of other physicians . Hospitalizations, surgeries, and ER visits in previous 12 months . Vitals . Screenings to include cognitive, depression, and falls . Referrals and appointments  In addition, I have reviewed and discussed with patient certain preventive protocols, quality metrics, and best practice recommendations. A written personalized care plan for preventive services as well as general preventive health recommendations were provided to patient.   Agreed with the above   Perlie Mayo, NP   03/25/2020   Nurse Notes: AWV conducted by nurse in office by phone. Patient gave consent to telehealth visit via audio. Patient at home at time of visit. Provider in the office at time of visit. Visit took 30 minutes to complete.

## 2020-03-25 NOTE — Addendum Note (Signed)
Addended by: Perlie Mayo on: 03/25/2020 04:02 PM   Modules accepted: Orders

## 2020-03-25 NOTE — Patient Instructions (Addendum)
Lisa Thornton , Thank you for taking time to come for your Medicare Wellness Visit. I appreciate your ongoing commitment to your health goals. Please review the following plan we discussed and let me know if I can assist you in the future.   Read this attachment for the kegel exercises we talked about :) Call if not better.  Please continue to practice social distancing to keep you, your family, and our community safe.  If you must go out, please wear a Mask and practice good handwashing.  Screening recommendations/referrals: Colonoscopy: Declined Mammogram: Scheduled for March 2022 Bone Density: Complete Recommended yearly ophthalmology/optometry visit for glaucoma screening and checkup Recommended yearly dental visit for hygiene and checkup  Vaccinations: Influenza vaccine: Fall 2022 Pneumococcal vaccine: Complete Tdap vaccine: 03/24/21 Shingles vaccine: Declined  Advanced directives: No  Conditions/risks identified: None  Next appointment: 10/06/20 @ 1 pm   Preventive Care 65 Years and Older, Female Preventive care refers to lifestyle choices and visits with your health care provider that can promote health and wellness. What does preventive care include?  A yearly physical exam. This is also called an annual well check.  Dental exams once or twice a year.  Routine eye exams. Ask your health care provider how often you should have your eyes checked.  Personal lifestyle choices, including:  Daily care of your teeth and gums.  Regular physical activity.  Eating a healthy diet.  Avoiding tobacco and drug use.  Limiting alcohol use.  Practicing safe sex.  Taking low-dose aspirin every day.  Taking vitamin and mineral supplements as recommended by your health care provider. What happens during an annual well check? The services and screenings done by your health care provider during your annual well check will depend on your age, overall health, lifestyle risk factors,  and family history of disease. Counseling  Your health care provider may ask you questions about your:  Alcohol use.  Tobacco use.  Drug use.  Emotional well-being.  Home and relationship well-being.  Sexual activity.  Eating habits.  History of falls.  Memory and ability to understand (cognition).  Work and work Statistician.  Reproductive health. Screening  You may have the following tests or measurements:  Height, weight, and BMI.  Blood pressure.  Lipid and cholesterol levels. These may be checked every 5 years, or more frequently if you are over 2 years old.  Skin check.  Lung cancer screening. You may have this screening every year starting at age 59 if you have a 30-pack-year history of smoking and currently smoke or have quit within the past 15 years.  Fecal occult blood test (FOBT) of the stool. You may have this test every year starting at age 17.  Flexible sigmoidoscopy or colonoscopy. You may have a sigmoidoscopy every 5 years or a colonoscopy every 10 years starting at age 79.  Hepatitis C blood test.  Hepatitis B blood test.  Sexually transmitted disease (STD) testing.  Diabetes screening. This is done by checking your blood sugar (glucose) after you have not eaten for a while (fasting). You may have this done every 1-3 years.  Bone density scan. This is done to screen for osteoporosis. You may have this done starting at age 76.  Mammogram. This may be done every 1-2 years. Talk to your health care provider about how often you should have regular mammograms. Talk with your health care provider about your test results, treatment options, and if necessary, the need for more tests. Vaccines  Your health  care provider may recommend certain vaccines, such as:  Influenza vaccine. This is recommended every year.  Tetanus, diphtheria, and acellular pertussis (Tdap, Td) vaccine. You may need a Td booster every 10 years.  Zoster vaccine. You may need  this after age 51.  Pneumococcal 13-valent conjugate (PCV13) vaccine. One dose is recommended after age 53.  Pneumococcal polysaccharide (PPSV23) vaccine. One dose is recommended after age 2. Talk to your health care provider about which screenings and vaccines you need and how often you need them. This information is not intended to replace advice given to you by your health care provider. Make sure you discuss any questions you have with your health care provider. Document Released: 04/16/2015 Document Revised: 12/08/2015 Document Reviewed: 01/19/2015 Elsevier Interactive Patient Education  2017 [place] Prevention in the Home Falls can cause injuries. They can happen to people of all ages. There are many things you can do to make your home safe and to help prevent falls. What can I do on the outside of my home?  Regularly fix the edges of walkways and driveways and fix any cracks.  Remove anything that might make you trip as you walk through a door, such as a raised step or threshold.  Trim any bushes or trees on the path to your home.  Use bright outdoor lighting.  Clear any walking paths of anything that might make someone trip, such as rocks or tools.  Regularly check to see if handrails are loose or broken. Make sure that both sides of any steps have handrails.  Any raised decks and porches should have guardrails on the edges.  Have any leaves, snow, or ice cleared regularly.  Use sand or salt on walking paths during winter.  Clean up any spills in your garage right away. This includes oil or grease spills. What can I do in the bathroom?  Use night lights.  Install grab bars by the toilet and in the tub and shower. Do not use towel bars as grab bars.  Use non-skid mats or decals in the tub or shower.  If you need to sit down in the shower, use a plastic, non-slip stool.  Keep the floor dry. Clean up any water that spills on the floor as soon as it  happens.  Remove soap buildup in the tub or shower regularly.  Attach bath mats securely with double-sided non-slip rug tape.  Do not have throw rugs and other things on the floor that can make you trip. What can I do in the bedroom?  Use night lights.  Make sure that you have a light by your bed that is easy to reach.  Do not use any sheets or blankets that are too big for your bed. They should not hang down onto the floor.  Have a firm chair that has side arms. You can use this for support while you get dressed.  Do not have throw rugs and other things on the floor that can make you trip. What can I do in the kitchen?  Clean up any spills right away.  Avoid walking on wet floors.  Keep items that you use a lot in easy-to-reach places.  If you need to reach something above you, use a strong step stool that has a grab bar.  Keep electrical cords out of the way.  Do not use floor polish or wax that makes floors slippery. If you must use wax, use non-skid floor wax.  Do not have  throw rugs and other things on the floor that can make you trip. What can I do with my stairs?  Do not leave any items on the stairs.  Make sure that there are handrails on both sides of the stairs and use them. Fix handrails that are broken or loose. Make sure that handrails are as long as the stairways.  Check any carpeting to make sure that it is firmly attached to the stairs. Fix any carpet that is loose or worn.  Avoid having throw rugs at the top or bottom of the stairs. If you do have throw rugs, attach them to the floor with carpet tape.  Make sure that you have a light switch at the top of the stairs and the bottom of the stairs. If you do not have them, ask someone to add them for you. What else can I do to help prevent falls?  Wear shoes that:  Do not have high heels.  Have rubber bottoms.  Are comfortable and fit you well.  Are closed at the toe. Do not wear sandals.  If you  use a stepladder:  Make sure that it is fully opened. Do not climb a closed stepladder.  Make sure that both sides of the stepladder are locked into place.  Ask someone to hold it for you, if possible.  Clearly mark and make sure that you can see:  Any grab bars or handrails.  First and last steps.  Where the edge of each step is.  Use tools that help you move around (mobility aids) if they are needed. These include:  Canes.  Walkers.  Scooters.  Crutches.  Turn on the lights when you go into a dark area. Replace any light bulbs as soon as they burn out.  Set up your furniture so you have a clear path. Avoid moving your furniture around.  If any of your floors are uneven, fix them.  If there are any pets around you, be aware of where they are.  Review your medicines with your doctor. Some medicines can make you feel dizzy. This can increase your chance of falling. Ask your doctor what other things that you can do to help prevent falls. This information is not intended to replace advice given to you by your health care provider. Make sure you discuss any questions you have with your health care provider. Document Released: 01/14/2009 Document Revised: 08/26/2015 Document Reviewed: 04/24/2014 Elsevier Interactive Patient Education  2017 [employer]   Kegel Exercises  Kegel exercises can help strengthen your pelvic floor muscles. The pelvic floor is a group of muscles that support your rectum, small intestine, and bladder. In females, pelvic floor muscles also help support the womb (uterus). These muscles help you control the flow of urine and stool. Kegel exercises are painless and simple, and they do not require any equipment. Your provider may suggest Kegel exercises to:  Improve bladder and bowel control.  Improve sexual response.  Improve weak pelvic floor muscles after surgery to remove the uterus (hysterectomy) or pregnancy (females).  Improve weak pelvic  floor muscles after prostate gland removal or surgery (males). Kegel exercises involve squeezing your pelvic floor muscles, which are the same muscles you squeeze when you try to stop the flow of urine or keep from passing gas. The exercises can be done while sitting, standing, or lying down, but it is best to vary your position. Exercises How to do Kegel exercises: 1. Squeeze your pelvic floor muscles tight. You  should feel a tight lift in your rectal area. If you are a female, you should also feel a tightness in your vaginal area. Keep your stomach, buttocks, and legs relaxed. 2. Hold the muscles tight for up to 10 seconds. 3. Breathe normally. 4. Relax your muscles. 5. Repeat as told by your health care provider. Repeat this exercise daily as told by your health care provider. Continue to do this exercise for at least 4-6 weeks, or for as long as told by your health care provider. You may be referred to a physical therapist who can help you learn more about how to do Kegel exercises. Depending on your condition, your health care provider may recommend:  Varying how long you squeeze your muscles.  Doing several sets of exercises every day.  Doing exercises for several weeks.  Making Kegel exercises a part of your regular exercise routine. This information is not intended to replace advice given to you by your health care provider. Make sure you discuss any questions you have with your health care provider. Document Revised: 11/07/2017 Document Reviewed: 11/07/2017 Elsevier Patient Education  2020 [employer].

## 2020-03-29 ENCOUNTER — Other Ambulatory Visit: Payer: Self-pay | Admitting: Family Medicine

## 2020-04-13 ENCOUNTER — Other Ambulatory Visit: Payer: Self-pay | Admitting: Family Medicine

## 2020-04-13 DIAGNOSIS — E1165 Type 2 diabetes mellitus with hyperglycemia: Secondary | ICD-10-CM

## 2020-05-10 ENCOUNTER — Other Ambulatory Visit: Payer: Self-pay

## 2020-05-10 DIAGNOSIS — E1165 Type 2 diabetes mellitus with hyperglycemia: Secondary | ICD-10-CM

## 2020-05-10 MED ORDER — METFORMIN HCL 1000 MG PO TABS: ORAL_TABLET | ORAL | 0 refills | Status: DC

## 2020-05-16 ENCOUNTER — Other Ambulatory Visit: Payer: Self-pay | Admitting: Family Medicine

## 2020-05-16 DIAGNOSIS — I10 Essential (primary) hypertension: Secondary | ICD-10-CM

## 2020-06-03 ENCOUNTER — Other Ambulatory Visit (HOSPITAL_COMMUNITY): Payer: Self-pay

## 2020-06-03 DIAGNOSIS — C50412 Malignant neoplasm of upper-outer quadrant of left female breast: Secondary | ICD-10-CM

## 2020-06-03 DIAGNOSIS — Z170 Estrogen receptor positive status [ER+]: Secondary | ICD-10-CM

## 2020-06-04 ENCOUNTER — Inpatient Hospital Stay (HOSPITAL_COMMUNITY): Payer: Medicare Other | Attending: Hematology

## 2020-06-04 ENCOUNTER — Ambulatory Visit (HOSPITAL_COMMUNITY): Payer: Medicaid Other

## 2020-06-04 DIAGNOSIS — Z170 Estrogen receptor positive status [ER+]: Secondary | ICD-10-CM | POA: Insufficient documentation

## 2020-06-04 DIAGNOSIS — Z7984 Long term (current) use of oral hypoglycemic drugs: Secondary | ICD-10-CM | POA: Insufficient documentation

## 2020-06-04 DIAGNOSIS — M8580 Other specified disorders of bone density and structure, unspecified site: Secondary | ICD-10-CM | POA: Insufficient documentation

## 2020-06-04 DIAGNOSIS — Z79899 Other long term (current) drug therapy: Secondary | ICD-10-CM | POA: Insufficient documentation

## 2020-06-04 DIAGNOSIS — C50412 Malignant neoplasm of upper-outer quadrant of left female breast: Secondary | ICD-10-CM | POA: Insufficient documentation

## 2020-06-04 DIAGNOSIS — Z79811 Long term (current) use of aromatase inhibitors: Secondary | ICD-10-CM | POA: Insufficient documentation

## 2020-06-07 ENCOUNTER — Other Ambulatory Visit: Payer: Self-pay

## 2020-06-07 DIAGNOSIS — E1165 Type 2 diabetes mellitus with hyperglycemia: Secondary | ICD-10-CM

## 2020-06-07 MED ORDER — METFORMIN HCL 1000 MG PO TABS: ORAL_TABLET | ORAL | 1 refills | Status: DC

## 2020-06-09 ENCOUNTER — Inpatient Hospital Stay (HOSPITAL_COMMUNITY): Payer: Medicare Other | Attending: Hematology

## 2020-06-09 ENCOUNTER — Other Ambulatory Visit: Payer: Self-pay

## 2020-06-09 ENCOUNTER — Ambulatory Visit (HOSPITAL_COMMUNITY)
Admission: RE | Admit: 2020-06-09 | Discharge: 2020-06-09 | Disposition: A | Discharge status: Home / Self Care | Payer: Medicare Other | Source: Ambulatory Visit | Attending: Nurse Practitioner | Admitting: Nurse Practitioner

## 2020-06-09 DIAGNOSIS — Z79811 Long term (current) use of aromatase inhibitors: Secondary | ICD-10-CM | POA: Diagnosis not present

## 2020-06-09 DIAGNOSIS — Z170 Estrogen receptor positive status [ER+]: Secondary | ICD-10-CM | POA: Insufficient documentation

## 2020-06-09 DIAGNOSIS — C50412 Malignant neoplasm of upper-outer quadrant of left female breast: Secondary | ICD-10-CM | POA: Insufficient documentation

## 2020-06-09 DIAGNOSIS — Z1231 Encounter for screening mammogram for malignant neoplasm of breast: Secondary | ICD-10-CM | POA: Diagnosis not present

## 2020-06-09 LAB — CBC WITH DIFFERENTIAL/PLATELET
Abs Immature Granulocytes: 0.03 10*3/uL (ref 0.00–0.07)
Basophils Absolute: 0 10*3/uL (ref 0.0–0.1)
Basophils Relative: 0 %
Eosinophils Absolute: 0.2 10*3/uL (ref 0.0–0.5)
Eosinophils Relative: 2 %
HCT: 32.6 % — ABNORMAL LOW (ref 36.0–46.0)
Hemoglobin: 11.3 g/dL — ABNORMAL LOW (ref 12.0–15.0)
Immature Granulocytes: 0 %
Lymphocytes Relative: 29 %
Lymphs Abs: 2.6 10*3/uL (ref 0.7–4.0)
MCH: 32.8 pg (ref 26.0–34.0)
MCHC: 34.7 g/dL (ref 30.0–36.0)
MCV: 94.5 fL (ref 80.0–100.0)
Monocytes Absolute: 0.4 10*3/uL (ref 0.1–1.0)
Monocytes Relative: 5 %
Neutro Abs: 5.7 10*3/uL (ref 1.7–7.7)
Neutrophils Relative %: 64 %
Platelets: 195 10*3/uL (ref 150–400)
RBC: 3.45 MIL/uL — ABNORMAL LOW (ref 3.87–5.11)
RDW: 12.2 % (ref 11.5–15.5)
WBC: 9 10*3/uL (ref 4.0–10.5)
nRBC: 0 % (ref 0.0–0.2)

## 2020-06-09 LAB — COMPREHENSIVE METABOLIC PANEL
ALT: 24 U/L (ref 0–44)
AST: 50 U/L — ABNORMAL HIGH (ref 15–41)
Albumin: 3.9 g/dL (ref 3.5–5.0)
Alkaline Phosphatase: 48 U/L (ref 38–126)
Anion gap: 13 (ref 5–15)
BUN: 24 mg/dL — ABNORMAL HIGH (ref 8–23)
CO2: 25 mmol/L (ref 22–32)
Calcium: 9.5 mg/dL (ref 8.9–10.3)
Chloride: 99 mmol/L (ref 98–111)
Creatinine, Ser: 0.89 mg/dL (ref 0.44–1.00)
GFR, Estimated: >60 mL/min (ref >60)
Glucose, Bld: 165 mg/dL — ABNORMAL HIGH (ref 70–99)
Potassium: 3 mmol/L — ABNORMAL LOW (ref 3.5–5.1)
Sodium: 137 mmol/L (ref 135–145)
Total Bilirubin: 0.6 mg/dL (ref 0.3–1.2)
Total Protein: 7.7 g/dL (ref 6.5–8.1)

## 2020-06-09 LAB — VITAMIN B12: Vitamin B-12: 323 pg/mL (ref 180–914)

## 2020-06-09 LAB — VITAMIN D 25 HYDROXY (VIT D DEFICIENCY, FRACTURES): Vit D, 25-Hydroxy: 61.35 ng/mL (ref 30–100)

## 2020-06-09 LAB — LACTATE DEHYDROGENASE: LDH: 120 U/L (ref 98–192)

## 2020-06-10 ENCOUNTER — Other Ambulatory Visit: Payer: Self-pay | Admitting: Family Medicine

## 2020-06-10 ENCOUNTER — Inpatient Hospital Stay (HOSPITAL_COMMUNITY): Payer: Medicare Other | Admitting: Hematology

## 2020-06-10 DIAGNOSIS — I10 Essential (primary) hypertension: Secondary | ICD-10-CM

## 2020-06-11 ENCOUNTER — Ambulatory Visit (INDEPENDENT_AMBULATORY_CARE_PROVIDER_SITE_OTHER): Payer: Medicare Other | Admitting: Nurse Practitioner

## 2020-06-11 ENCOUNTER — Ambulatory Visit (HOSPITAL_COMMUNITY): Payer: Medicaid Other

## 2020-06-11 ENCOUNTER — Other Ambulatory Visit: Payer: Self-pay

## 2020-06-11 ENCOUNTER — Encounter: Payer: Self-pay | Admitting: Nurse Practitioner

## 2020-06-11 VITALS — BP 147/81 | HR 108 | Temp 98.4°F | Resp 20 | Ht 64.0 in | Wt 154.0 lb

## 2020-06-11 DIAGNOSIS — R300 Dysuria: Secondary | ICD-10-CM

## 2020-06-11 DIAGNOSIS — N390 Urinary tract infection, site not specified: Secondary | ICD-10-CM | POA: Diagnosis not present

## 2020-06-11 LAB — POCT URINALYSIS DIPSTICK
Glucose, UA: NEGATIVE
Ketones, UA: NEGATIVE
Nitrite, UA: POSITIVE
Protein, UA: POSITIVE — AB
Spec Grav, UA: 1.015 (ref 1.010–1.025)
Urobilinogen, UA: 0.2 E.U./dL
pH, UA: 6 (ref 5.0–8.0)

## 2020-06-11 MED ORDER — CIPROFLOXACIN HCL 500 MG PO TABS: 500.0000 mg | ORAL_TABLET | Freq: Two times a day (BID) | ORAL | 0 refills | Status: DC

## 2020-06-11 NOTE — Assessment & Plan Note (Signed)
-  urinalysis positive for UTI -Rx. cipro -urine sent for culture

## 2020-06-11 NOTE — Progress Notes (Signed)
Acute Office Visit  Subjective:    Patient ID: Lisa Thornton, female    DOB: 1955/07/01, 65 y.o.   MRN: [id number]  Chief Complaint  Patient presents with  . Dysuria    X 1 week     HPI Patient is in today for dysuria x1 week.  Past Medical History:  Diagnosis Date  . Anemia 12/31/2017  . Cancer [facility])    left breast cancer  . Chest pain   . Hypertension     Past Surgical History:  Procedure Laterality Date  . MASTECTOMY MODIFIED RADICAL Left 12/31/2017   Procedure: LEFT MODIFIED RADICAL MASTECTOMY;  Surgeon: Aviva Signs, MD;  Location: [facility];  Service: General;  Laterality: Left;  . PORTACATH PLACEMENT Right 06/27/2017   Procedure: INSERTION PORT-A-CATH;  Surgeon: Aviva Signs, MD;  Location: [facility];  Service: General;  Laterality: Right;    Family History  Problem Relation Age of Onset  . Breast cancer Mother   . Thyroid disease Mother   . Heart disease Mother   . Heart attack Father   . Heart attack Sister   . Hypertension Sister   . Heart attack Brother   . Cancer Sister   . Stroke Brother   . Alzheimer's disease Maternal Aunt     Social History   Socioeconomic History  . Marital status: Divorced    Spouse name: Not on file  . Number of children: 2  . Years of education: Not on file  . Highest education level: 10th grade  Occupational History  . Not on file  Tobacco Use  . Smoking status: Never Smoker  . Smokeless tobacco: Never Used  Vaping Use  . Vaping Use: Never used  Substance and Sexual Activity  . Alcohol use: No  . Drug use: No  . Sexual activity: Not Currently  Other Topics Concern  . 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 area    Social  Determinants of Health   Financial Resource Strain: Low Risk   . Difficulty of Paying Living Expenses: Not hard at all  Food Insecurity: No Food Insecurity  . Worried About Charity fundraiser in the Last Year: Never true  . Ran Out of Food in the Last Year: Never true  Transportation Needs: No Transportation Needs  . Lack of Transportation (Medical): No  . Lack of Transportation (Non-Medical): No  Physical Activity: Insufficiently Active  . Days of Exercise per Week: 3 days  . Minutes of Exercise per Session: 30 min  Stress: No Stress Concern Present  . Feeling of Stress : Not at all  Social Connections: Moderately Isolated  . Frequency of Communication with Friends and Family: More than three times a week  . Frequency of Social Gatherings with Friends and Family: Once a week  . Attends Religious Services: More than 4 times per year  . Active Member of Clubs or Organizations: No  . Attends Archivist Meetings: Never  . Marital Status: Divorced  Human resources officer Violence: Not At Risk  . Fear of Current or Ex-Partner: No  .  Emotionally Abused: No  . Physically Abused: No  . Sexually Abused: No    Outpatient Medications Prior to Visit  Medication Sig Dispense Refill  . ACCU-CHEK GUIDE test strip USE 1 STRIP TO CHECK GLUCOSE IN THE MORNING THEN AT NOON THEN IN THE EVENING AND AT BEDTIME USE  AS  INSTRUCTED 100 each 0  . amLODipine (NORVASC) 5 MG tablet Take 1 tablet (5 mg total) by mouth daily. 30 tablet 6  . anastrozole (ARIMIDEX) 1 MG tablet Take 1 tablet by mouth once daily 30 tablet 6  . blood glucose meter kit and supplies Dispense based on patient and insurance preference. Use up to four times daily as directed. (FOR ICD-10 E10.9, E11.9). 1 each 0  . calcium carbonate (OS-CAL) 600 MG TABS tablet Take 600 mg by mouth daily with breakfast.    . cholecalciferol (VITAMIN D3) 25 MCG (1000 UT) tablet Take 1,000 Units by mouth 2 (two) times daily.     . Cyanocobalamin  (VITAMIN B 12 PO) Take 1 tablet by mouth daily.    . hydrochlorothiazide (HYDRODIURIL) 25 MG tablet Take 1 tablet by mouth once daily 30 tablet 0  . metFORMIN (GLUCOPHAGE) 1000 MG tablet TAKE 1 TABLET BY MOUTH 2 TIMES DAILY WITH A MEAL. 180 tablet 1  . metoprolol tartrate (LOPRESSOR) 50 MG tablet Take 1 tablet by mouth twice daily 60 tablet 0  . Omega-3 Fatty Acids (FISH OIL) 1000 MG CAPS Take 1 capsule by mouth 2 (two) times daily.    . ondansetron (ZOFRAN) 4 MG tablet Take 1 tablet (4 mg total) by mouth every 8 (eight) hours as needed for nausea or vomiting. 30 tablet 2  . rosuvastatin (CRESTOR) 5 MG tablet Take 1 tablet (5 mg total) by mouth daily. 90 tablet 1   No facility-administered medications prior to visit.    No Known Allergies                                                                                                                                           Review of Systems  Constitutional: Negative.   Genitourinary: Positive for dysuria. Negative for flank pain and frequency.       Objective:    Physical Exam Constitutional:      Appearance: Normal appearance.     Comments: U/A positive for UTI  Abdominal:     Tenderness: There is no right CVA tenderness or left CVA tenderness.  Neurological:     Mental Status: She is alert.     BP (!) 147/81   Pulse (!) 108   Temp 98.4 F (36.9 C)   Resp 20   Ht $R'5\' 4"'ur$  (1.626 m)   Wt 154 lb (69.9 kg)   SpO2 96%   BMI 26.43 kg/m  Wt Readings from Last 3 Encounters:  06/11/20 154 lb (69.9 kg)  03/25/20 150 lb (68 kg)  03/24/20 150 lb (68 kg)    Health Maintenance Due  Topic Date Due  . FOOT EXAM  Never done  . OPHTHALMOLOGY EXAM  Never done  . URINE MICROALBUMIN  Never done  . COVID-19 Vaccine (1) Never done  . COLONOSCOPY (Pts 45-67yrs Insurance coverage will need to be  confirmed)  Never done    There are no preventive care reminders to display for this patient.   Lab Results  Component Value Date   TSH 1.412 12/31/2017   Lab Results  Component Value Date   WBC 9.0 06/09/2020   HGB 11.3 (L) 06/09/2020   HCT 32.6 (L) 06/09/2020   MCV 94.5 06/09/2020   PLT 195 06/09/2020   Lab Results  Component Value Date   NA 137 06/09/2020   K 3.0 (L) 06/09/2020   CO2 25 06/09/2020   GLUCOSE 165 (H) 06/09/2020   BUN 24 (H) 06/09/2020   CREATININE 0.89 06/09/2020   BILITOT 0.6 06/09/2020   ALKPHOS 48 06/09/2020   AST 50 (H) 06/09/2020   ALT 24 06/09/2020   PROT 7.7 06/09/2020   ALBUMIN 3.9 06/09/2020   CALCIUM 9.5 06/09/2020   ANIONGAP 13 06/09/2020   No results found for: CHOL No results found for: HDL No results found for: LDLCALC No results found for: TRIG No results found for: CHOLHDL Lab Results  Component Value Date   HGBA1C 6.1 (A) 03/24/2020       Assessment & Plan:   Problem List Items Addressed This Visit      Genitourinary   UTI (urinary tract infection)    -urinalysis positive for UTI -Rx. cipro -urine sent for culture        Other Visit Diagnoses    Dysuria    -  Primary   Relevant Orders   Urine Culture   POCT Urinalysis Dipstick       Meds ordered this encounter  Medications  . ciprofloxacin (CIPRO) 500 MG tablet    Sig: Take 1 tablet (500 mg total) by mouth 2 (two) times daily.    Dispense:  10 tablet    Refill:  0     Noreene Larsson, NP

## 2020-06-14 NOTE — Progress Notes (Signed)
No need to call today, only preliminary report. FYI: We sent in cipro.

## 2020-06-15 ENCOUNTER — Inpatient Hospital Stay (HOSPITAL_BASED_OUTPATIENT_CLINIC_OR_DEPARTMENT_OTHER): Payer: Medicare Other | Admitting: Hematology

## 2020-06-15 VITALS — BP 125/70 | HR 54 | Temp 96.8°F | Resp 18 | Wt 151.3 lb

## 2020-06-15 DIAGNOSIS — Z79811 Long term (current) use of aromatase inhibitors: Secondary | ICD-10-CM | POA: Diagnosis not present

## 2020-06-15 DIAGNOSIS — C50412 Malignant neoplasm of upper-outer quadrant of left female breast: Secondary | ICD-10-CM | POA: Diagnosis not present

## 2020-06-15 DIAGNOSIS — Z79899 Other long term (current) drug therapy: Secondary | ICD-10-CM | POA: Diagnosis not present

## 2020-06-15 DIAGNOSIS — Z170 Estrogen receptor positive status [ER+]: Secondary | ICD-10-CM

## 2020-06-15 DIAGNOSIS — Z7984 Long term (current) use of oral hypoglycemic drugs: Secondary | ICD-10-CM | POA: Diagnosis not present

## 2020-06-15 DIAGNOSIS — M8580 Other specified disorders of bone density and structure, unspecified site: Secondary | ICD-10-CM | POA: Diagnosis not present

## 2020-06-15 NOTE — Progress Notes (Signed)
[place] [address] [place] [address]   Patient Care Team: Lisa Mayo, NP as PCP - General (Family Medicine) Lisa Commons, MD (Inactive) as PCP - Cardiology (Cardiology)  SUMMARY OF ONCOLOGIC HISTORY: Oncology History  Breast cancer of upper-outer quadrant of left female breast ([place])  06/22/2017 Initial Diagnosis   Breast cancer of upper-outer quadrant of left female breast ([place])   06/22/2017 Cancer Staging   Staging form: Breast, AJCC 8th Edition - Clinical stage from 06/22/2017: Stage IIIB (cT3, cN1, cM0, G3, ER+, PR-, HER2-) - Signed by Lisa Jack, MD on 06/22/2017   06/29/2017 Imaging   CT chest showing left axillary adenopathy, 1.2 cm anterior carinal adenopathy, left subpectoral lymph node subcentimeter  06/25/2017 2D echocardiogram with ejection fraction of 55-60%   07/18/2017 - 12/06/2017 Chemotherapy   The patient had dexamethasone (DECADRON) 4 MG tablet, 1 of 1 cycle, Start date: --, End date: -- DOXOrubicin (ADRIAMYCIN) chemo injection 110 mg, 60 mg/m2 = 110 mg, Intravenous,  Once, 4 of 4 cycles Administration: 110 mg (07/18/2017), 110 mg (08/01/2017), 110 mg (08/20/2017), 110 mg (09/03/2017) palonosetron (ALOXI) injection 0.25 mg, 0.25 mg, Intravenous,  Once, 4 of 4 cycles Administration: 0.25 mg (07/18/2017), 0.25 mg (08/01/2017), 0.25 mg (08/20/2017), 0.25 mg (09/03/2017) pegfilgrastim-cbqv (UDENYCA) injection 6 mg, 6 mg, Subcutaneous, Once, 4 of 4 cycles Administration: 6 mg (07/19/2017), 6 mg (08/02/2017), 6 mg (08/22/2017), 6 mg (09/05/2017) cyclophosphamide (CYTOXAN) 1,100 mg in sodium chloride 0.9 % 250 mL chemo infusion, 600 mg/m2 = 1,100 mg, Intravenous,  Once, 4 of 4 cycles Administration: 1,100 mg (07/18/2017), 1,100 mg (08/01/2017), 1,100 mg (08/20/2017), 1,100 mg (09/03/2017) PACLitaxel (TAXOL) 120 mg in sodium chloride 0.9 % 250 mL chemo infusion (</= 54m/m2), 64 mg/m2 = 120 mg (80 % of original dose 80 mg/m2), Intravenous,  Once, 12 of  12 cycles Dose modification: 64 mg/m2 (80 % of original dose 80 mg/m2, Cycle 5, Reason: Provider Judgment), 60 mg/m2 (75 % of original dose 80 mg/m2, Cycle 15, Reason: Other (see comments)), 40 mg/m2 (50 % of original dose 80 mg/m2, Cycle 16, Reason: Other (see comments)) Administration: 120 mg (09/17/2017), 144 mg (09/24/2017), 144 mg (10/03/2017), 144 mg (10/11/2017), 144 mg (10/18/2017), 144 mg (10/25/2017), 144 mg (11/01/2017), 144 mg (11/08/2017), 144 mg (11/15/2017), 144 mg (11/22/2017), 108 mg (11/29/2017), 72 mg (12/06/2017) fosaprepitant (EMEND) 150 mg, dexamethasone (DECADRON) 12 mg in sodium chloride 0.9 % 145 mL IVPB, , Intravenous,  Once, 4 of 4 cycles Administration:  (07/18/2017),  (08/01/2017),  (08/20/2017),  (09/03/2017)  for chemotherapy treatment.    02/07/2018 -  Chemotherapy   The patient had trastuzumab (HERCEPTIN) 600 mg in sodium chloride 0.9 % 250 mL chemo infusion, 567 mg, Intravenous,  Once, 15 of 15 cycles Administration: 600 mg (02/07/2018), 450 mg (02/27/2018), 450 mg (05/01/2018), 450 mg (05/22/2018), 450 mg (06/13/2018), 450 mg (07/03/2018), 450 mg (07/24/2018), 450 mg (08/15/2018), 450 mg (09/06/2018), 450 mg (03/20/2018), 450 mg (09/25/2018), 450 mg (10/16/2018), 450 mg (11/06/2018), 450 mg (11/27/2018), 450 mg (04/10/2018) trastuzumab-dkst (OGIVRI) 450 mg in sodium chloride 0.9 % 250 mL chemo infusion, 420 mg (100 % of original dose 6 mg/kg), Intravenous,  Once, 4 of 4 cycles Dose modification: 6 mg/kg (original dose 6 mg/kg, Cycle 16, Reason: Other (see comments), Comment: change to biosimilar) Administration: 450 mg (12/23/2018), 450 mg (01/13/2019), 450 mg (02/10/2019), 450 mg (03/04/2019)  for chemotherapy treatment.     Radiation Therapy       CHIEF COMPLIANT: Follow-up for  left breast cancer   INTERVAL HISTORY: [name] is a 65 y.o. female here today for follow up of her left breast cancer. Her last visit was on 10/24/2019.   Today she reports feeling mildly improved from her recent  UTI from last week.  Appetite is 100%.  50% energy levels are reported.  Still has some dysuria.  No new onset pains reported.  She is continuing to tolerate anastrozole.    REVIEW OF SYSTEMS:   Review of Systems  Genitourinary: Positive for dysuria.   All other systems reviewed and are negative.   I have reviewed the past medical history, past surgical history, social history and family history with the patient and they are unchanged from previous note.   ALLERGIES:   has No Known Allergies.   MEDICATIONS:  Current Outpatient Medications  Medication Sig Dispense Refill   ACCU-CHEK GUIDE test strip USE 1 STRIP TO CHECK GLUCOSE IN THE MORNING THEN AT NOON THEN IN THE EVENING AND AT BEDTIME USE  AS  INSTRUCTED 100 each 0   amLODipine (NORVASC) 5 MG tablet Take 1 tablet (5 mg total) by mouth daily. 30 tablet 6   anastrozole (ARIMIDEX) 1 MG tablet Take 1 tablet by mouth once daily 30 tablet 6   blood glucose meter kit and supplies Dispense based on patient and insurance preference. Use up to four times daily as directed. (FOR ICD-10 E10.9, E11.9). 1 each 0   calcium carbonate (OS-CAL) 600 MG TABS tablet Take 600 mg by mouth daily with breakfast.     cholecalciferol (VITAMIN D3) 25 MCG (1000 UT) tablet Take 1,000 Units by mouth 2 (two) times daily.      ciprofloxacin (CIPRO) 500 MG tablet Take 1 tablet (500 mg total) by mouth 2 (two) times daily. 10 tablet 0   Cyanocobalamin (VITAMIN B 12 PO) Take 1 tablet by mouth daily.     hydrochlorothiazide (HYDRODIURIL) 25 MG tablet Take 1 tablet by mouth once daily 30 tablet 0   metFORMIN (GLUCOPHAGE) 1000 MG tablet TAKE 1 TABLET BY MOUTH 2 TIMES DAILY WITH A MEAL. 180 tablet 1   metoprolol tartrate (LOPRESSOR) 50 MG tablet Take 1 tablet by mouth twice daily 60 tablet 0   Omega-3 Fatty Acids (FISH OIL) 1000 MG CAPS Take 1 capsule by mouth 2 (two) times daily.     ondansetron (ZOFRAN) 4 MG tablet Take 1 tablet (4 mg total) by mouth every  8 (eight) hours as needed for nausea or vomiting. 30 tablet 2   rosuvastatin (CRESTOR) 5 MG tablet Take 1 tablet (5 mg total) by mouth daily. 90 tablet 1   No current facility-administered medications for this visit.     PHYSICAL EXAMINATION: Performance status (ECOG): 1 - Symptomatic but completely ambulatory  There were no vitals filed for this visit. Wt Readings from Last 3 Encounters:  06/11/20 154 lb (69.9 kg)  03/25/20 150 lb (68 kg)  03/24/20 150 lb (68 kg)   Physical Exam Vitals reviewed.  Constitutional:      Appearance: Normal appearance.  Cardiovascular:     Rate and Rhythm: Normal rate and regular rhythm.     Heart sounds: Normal heart sounds.  Pulmonary:     Effort: Pulmonary effort is normal.     Breath sounds: Normal breath sounds.  Abdominal:     General: There is no distension.     Palpations: Abdomen is soft. There is no mass.  Musculoskeletal:        General:  No swelling.  Skin:    General: Skin is warm.  Neurological:     General: No focal deficit present.     Mental Status: She is alert and oriented to person, place, and time.  Psychiatric:        Mood and Affect: Mood normal.        Behavior: Behavior normal.   Left mastectomy site is within normal limits.  Right breast has no palpable masses.  Breast Exam Chaperone: Milinda Antis, MD     LABORATORY DATA:  I have reviewed the data as listed CMP Latest Ref Rng & Units 06/09/2020 10/20/2019 04/17/2019  Glucose 70 - 99 mg/dL 165(H) 525(HH) 253(H)  BUN 8 - 23 mg/dL 24(H) 15 14  Creatinine 0.44 - 1.00 mg/dL 0.89 1.06(H) 0.97  Sodium 135 - 145 mmol/L 137 132(L) 135  Potassium 3.5 - 5.1 mmol/L 3.0(L) 4.5 4.3  Chloride 98 - 111 mmol/L 99 93(L) 100  CO2 22 - 32 mmol/L _0 Calcium 8.9 - 10.3 mg/dL 9.5 9.9 9.5  Total Protein 6.5 - 8.1 g/dL 7.7 7.6 7.7  Total Bilirubin 0.3 - 1.2 mg/dL 0.6 0.6 0.4  Alkaline Phos 38 - 126 U/L 48 62 55  AST 15 - 41 U/L 50(H) 78(H) 38  ALT 0 - 44 U/L 24 33 27    No results found for: [id number] Lab Results  Component Value Date   WBC 9.0 06/09/2020   HGB 11.3 (L) 06/09/2020   HCT 32.6 (L) 06/09/2020   MCV 94.5 06/09/2020   PLT 195 06/09/2020   NEUTROABS 5.7 06/09/2020    ASSESSMENT:  1.  Poorly differentiated left breast cancer stage IIIb: - On 06/12/2017 she had a left breast biopsy with sarcomatoid features, left axillary lymph node biopsy was negative. - Her 2D echo showed EF 55 to 60% - CT of the chest showed subcarinal adenopathy. - PET CT scan was negative for metastatic disease. -She had 4 cycles of neoadjuvant dose dense AC from 07/18/2017 through 09/03/2017. - She had 12 cycles of weekly paclitaxel from 09/17/2017 through 12/06/2017. -She underwent a left modified radical mastectomy on 12/31/2017. - The pathology revealed a 2.3 cm poorly differentiated invasive ductal carcinoma with sarcomatoid changes, resection margins negative, high-grade DCIS, no skin involvement, 0 out of 8 lymph nodes positive, negative for lymphovascular or perineural invasion.  ER positive, PR and HER-2 negative.  Pathological staging is [id number]. - Her receptors were rechecked on a mastectomy specimen and HER-2 was positive by IHC.  This was initially negative by [facility] on biopsy specimen.  ER was negative on mastectomy specimen.  It was 60% positive on biopsy specimen.  Ki 67 has decreased from 30% to 80% on biopsy.  This was reviewed by a second pathologist and confirmed. - It was recommended she have 1 year of Herceptin. Pertuzumab was not added secondary to lymph node negativity.  - 1 year of Herceptin completed on 03/04/2019. -Anastrozole started on 02/27/2018.   2.  Osteopenia: - Last DEXA scan on 03/07/2018 showed T score of -1.8.    PLAN:  1.  Poorly differentiated left breast cancer stage IIIb: -She is tolerating anastrozole very well. -Physical examination today did not reveal any palpable mass in the right breast.  Left mastectomy site is within normal  limits. -We reviewed mammogram from 06/09/2020 BI-RADS Category 1. -I have reviewed her blood work which showed slightly elevated AST which is more or less stable.  Other LFTs are normal.  CBC was grossly  within normal limits. -Recommend follow-up in 6 months.  2.  Osteopenia: -Vitamin D level was 61.  Continue calcium and vitamin D supplements. -Repeat DEXA scan prior to next visit.  Recommended weightbearing exercises.    No orders of the defined types were placed in this encounter.  The patient has a good understanding of the overall plan. she agrees with it. she will call with any problems that may develop before the next visit here.    Lisa Jack, MD [place] [phone]   I, Milinda Antis, am acting as a scribe for Dr. Sanda Linger.  I, Lisa Jack MD, have reviewed the above documentation for accuracy and completeness, and I agree with the above.

## 2020-06-16 ENCOUNTER — Other Ambulatory Visit: Payer: Self-pay | Admitting: Nurse Practitioner

## 2020-06-16 ENCOUNTER — Other Ambulatory Visit: Payer: Self-pay | Admitting: Family Medicine

## 2020-06-16 DIAGNOSIS — E1165 Type 2 diabetes mellitus with hyperglycemia: Secondary | ICD-10-CM

## 2020-06-16 LAB — URINE CULTURE

## 2020-06-16 MED ORDER — NITROFURANTOIN MONOHYD MACRO 100 MG PO CAPS: 100.0000 mg | ORAL_CAPSULE | Freq: Two times a day (BID) | ORAL | 0 refills | Status: DC

## 2020-06-16 NOTE — Progress Notes (Signed)
Urine culture showed cipro resistance, so I calle din a rx. For macrobid, a different antibiotic that should work based on the e. Coli that grew in the culture.

## 2020-06-16 NOTE — Progress Notes (Signed)
Urine culture showed cipro resistance, so Rx. macrobid.

## 2020-06-18 ENCOUNTER — Ambulatory Visit (HOSPITAL_COMMUNITY)
Admission: RE | Admit: 2020-06-18 | Discharge: 2020-06-18 | Disposition: A | Discharge status: Home / Self Care | Payer: Medicare Other | Source: Ambulatory Visit | Attending: Hematology | Admitting: Hematology

## 2020-06-18 DIAGNOSIS — Z853 Personal history of malignant neoplasm of breast: Secondary | ICD-10-CM | POA: Diagnosis not present

## 2020-06-18 DIAGNOSIS — M85851 Other specified disorders of bone density and structure, right thigh: Secondary | ICD-10-CM | POA: Diagnosis not present

## 2020-06-18 DIAGNOSIS — C50412 Malignant neoplasm of upper-outer quadrant of left female breast: Secondary | ICD-10-CM

## 2020-06-18 DIAGNOSIS — Z13820 Encounter for screening for osteoporosis: Secondary | ICD-10-CM | POA: Diagnosis not present

## 2020-06-18 DIAGNOSIS — Z780 Asymptomatic menopausal state: Secondary | ICD-10-CM | POA: Diagnosis not present

## 2020-06-18 DIAGNOSIS — M8589 Other specified disorders of bone density and structure, multiple sites: Secondary | ICD-10-CM | POA: Diagnosis not present

## 2020-06-18 DIAGNOSIS — Z170 Estrogen receptor positive status [ER+]: Secondary | ICD-10-CM

## 2020-06-27 ENCOUNTER — Other Ambulatory Visit: Payer: Self-pay | Admitting: Family Medicine

## 2020-07-11 ENCOUNTER — Other Ambulatory Visit (HOSPITAL_COMMUNITY): Payer: Self-pay | Admitting: Hematology

## 2020-07-11 ENCOUNTER — Other Ambulatory Visit: Payer: Self-pay | Admitting: Family Medicine

## 2020-07-11 DIAGNOSIS — C50412 Malignant neoplasm of upper-outer quadrant of left female breast: Secondary | ICD-10-CM

## 2020-07-11 DIAGNOSIS — I10 Essential (primary) hypertension: Secondary | ICD-10-CM

## 2020-08-02 ENCOUNTER — Other Ambulatory Visit: Payer: Self-pay | Admitting: Family Medicine

## 2020-08-15 ENCOUNTER — Other Ambulatory Visit: Payer: Self-pay | Admitting: Family Medicine

## 2020-08-15 DIAGNOSIS — E1165 Type 2 diabetes mellitus with hyperglycemia: Secondary | ICD-10-CM

## 2020-08-15 DIAGNOSIS — I10 Essential (primary) hypertension: Secondary | ICD-10-CM

## 2020-08-24 ENCOUNTER — Telehealth: Payer: Self-pay | Admitting: *Deleted

## 2020-08-24 NOTE — Telephone Encounter (Signed)
LVM to see if pt had eye exam within the last year and if so where if not we can schedule her for diabetic retinal screening here in office

## 2020-08-29 ENCOUNTER — Other Ambulatory Visit: Payer: Self-pay | Admitting: Family Medicine

## 2020-09-19 ENCOUNTER — Other Ambulatory Visit: Payer: Self-pay | Admitting: Family Medicine

## 2020-09-19 DIAGNOSIS — I10 Essential (primary) hypertension: Secondary | ICD-10-CM

## 2020-09-28 ENCOUNTER — Other Ambulatory Visit: Payer: Self-pay | Admitting: *Deleted

## 2020-09-28 ENCOUNTER — Ambulatory Visit: Payer: Medicare Other

## 2020-09-28 ENCOUNTER — Other Ambulatory Visit: Payer: Self-pay

## 2020-09-28 DIAGNOSIS — I10 Essential (primary) hypertension: Secondary | ICD-10-CM

## 2020-09-28 LAB — HM DIABETES EYE EXAM

## 2020-09-28 MED ORDER — AMLODIPINE BESYLATE 5 MG PO TABS: 5.0000 mg | ORAL_TABLET | Freq: Every day | ORAL | 6 refills | Status: DC

## 2020-09-30 ENCOUNTER — Encounter: Payer: Self-pay | Admitting: *Deleted

## 2020-10-06 ENCOUNTER — Encounter: Payer: Medicare Other | Admitting: Family Medicine

## 2020-10-10 ENCOUNTER — Other Ambulatory Visit (HOSPITAL_COMMUNITY): Payer: Self-pay | Admitting: Hematology

## 2020-10-10 ENCOUNTER — Other Ambulatory Visit: Payer: Self-pay | Admitting: Family Medicine

## 2020-10-10 DIAGNOSIS — C50412 Malignant neoplasm of upper-outer quadrant of left female breast: Secondary | ICD-10-CM

## 2020-10-10 DIAGNOSIS — I10 Essential (primary) hypertension: Secondary | ICD-10-CM

## 2020-10-11 ENCOUNTER — Other Ambulatory Visit: Payer: Self-pay

## 2020-10-11 ENCOUNTER — Encounter (HOSPITAL_COMMUNITY): Payer: Self-pay | Admitting: Hematology

## 2020-10-11 MED ORDER — ROSUVASTATIN CALCIUM 5 MG PO TABS: 5.0000 mg | ORAL_TABLET | Freq: Every day | ORAL | 1 refills | Status: DC

## 2020-10-26 ENCOUNTER — Other Ambulatory Visit: Payer: Self-pay | Admitting: Nurse Practitioner

## 2020-10-26 DIAGNOSIS — E1165 Type 2 diabetes mellitus with hyperglycemia: Secondary | ICD-10-CM

## 2020-11-03 ENCOUNTER — Encounter: Payer: Medicare Other | Admitting: Family Medicine

## 2020-11-11 ENCOUNTER — Ambulatory Visit (INDEPENDENT_AMBULATORY_CARE_PROVIDER_SITE_OTHER): Payer: Medicare Other | Admitting: Internal Medicine

## 2020-11-11 ENCOUNTER — Encounter: Payer: Medicare Other | Admitting: Internal Medicine

## 2020-11-11 ENCOUNTER — Encounter: Payer: Self-pay | Admitting: Internal Medicine

## 2020-11-11 ENCOUNTER — Other Ambulatory Visit: Payer: Self-pay

## 2020-11-11 ENCOUNTER — Encounter: Payer: Medicare Other | Admitting: Family Medicine

## 2020-11-11 VITALS — BP 131/84 | HR 107 | Temp 98.1°F | Resp 18 | Ht 64.0 in | Wt 150.4 lb

## 2020-11-11 DIAGNOSIS — B351 Tinea unguium: Secondary | ICD-10-CM | POA: Diagnosis not present

## 2020-11-11 DIAGNOSIS — Z23 Encounter for immunization: Secondary | ICD-10-CM

## 2020-11-11 DIAGNOSIS — Z0001 Encounter for general adult medical examination with abnormal findings: Secondary | ICD-10-CM | POA: Insufficient documentation

## 2020-11-11 DIAGNOSIS — R5382 Chronic fatigue, unspecified: Secondary | ICD-10-CM | POA: Diagnosis not present

## 2020-11-11 DIAGNOSIS — I10 Essential (primary) hypertension: Secondary | ICD-10-CM | POA: Diagnosis not present

## 2020-11-11 DIAGNOSIS — R5383 Other fatigue: Secondary | ICD-10-CM | POA: Insufficient documentation

## 2020-11-11 DIAGNOSIS — E1165 Type 2 diabetes mellitus with hyperglycemia: Secondary | ICD-10-CM

## 2020-11-11 DIAGNOSIS — Z0000 Encounter for general adult medical examination without abnormal findings: Secondary | ICD-10-CM

## 2020-11-11 DIAGNOSIS — Z1211 Encounter for screening for malignant neoplasm of colon: Secondary | ICD-10-CM

## 2020-11-11 LAB — POCT GLYCOSYLATED HEMOGLOBIN (HGB A1C)
HbA1c, POC (controlled diabetic range): 6.2 % (ref 0.0–7.0)
HbA1c, POC (prediabetic range): 6.2 % (ref 5.7–6.4)

## 2020-11-11 MED ORDER — TERBINAFINE HCL 250 MG PO TABS
250.0000 mg | ORAL_TABLET | Freq: Every day | ORAL | 0 refills | Status: DC
Start: 2020-11-11 — End: 2021-03-17

## 2020-11-11 NOTE — Patient Instructions (Signed)
Health Maintenance, Female Adopting a healthy lifestyle and getting preventive care are important in promoting health and wellness. Ask your health care provider about: The right schedule for you to have regular tests and exams. Things you can do on your own to prevent diseases and keep yourself healthy. What should I know about diet, weight, and exercise? Eat a healthy diet  Eat a diet that includes plenty of vegetables, fruits, low-fat dairy products, and lean protein. Do not eat a lot of foods that are high in solid fats, added sugars, or sodium.  Maintain a healthy weight Body mass index (BMI) is used to identify weight problems. It estimates body fat based on height and weight. Your health care provider can help determineyour BMI and help you achieve or maintain a healthy weight. Get regular exercise Get regular exercise. This is one of the most important things you can do for your health. Most adults should: Exercise for at least 150 minutes each week. The exercise should increase your heart rate and make you sweat (moderate-intensity exercise). Do strengthening exercises at least twice a week. This is in addition to the moderate-intensity exercise. Spend less time sitting. Even light physical activity can be beneficial. Watch cholesterol and blood lipids Have your blood tested for lipids and cholesterol at 67 years of age, then havethis test every 5 years. Have your cholesterol levels checked more often if: Your lipid or cholesterol levels are high. You are older than 67 years of age. You are at high risk for heart disease. What should I know about cancer screening? Depending on your health history and family history, you may need to have cancer screening at various ages. This may include screening for: Breast cancer. Cervical cancer. Colorectal cancer. Skin cancer. Lung cancer. What should I know about heart disease, diabetes, and high blood pressure? Blood pressure and heart  disease High blood pressure causes heart disease and increases the risk of stroke. This is more likely to develop in people who have high blood pressure readings, are of African descent, or are overweight. Have your blood pressure checked: Every 3-5 years if you are 18-39 years of age. Every year if you are 40 years old or older. Diabetes Have regular diabetes screenings. This checks your fasting blood sugar level. Have the screening done: Once every three years after age 40 if you are at a normal weight and have a low risk for diabetes. More often and at a younger age if you are overweight or have a high risk for diabetes. What should I know about preventing infection? Hepatitis B If you have a higher risk for hepatitis B, you should be screened for this virus. Talk with your health care provider to find out if you are at risk forhepatitis B infection. Hepatitis C Testing is recommended for: Everyone born from 1945 through 1965. Anyone with known risk factors for hepatitis C. Sexually transmitted infections (STIs) Get screened for STIs, including gonorrhea and chlamydia, if: You are sexually active and are younger than 67 years of age. You are older than 67 years of age and your health care provider tells you that you are at risk for this type of infection. Your sexual activity has changed since you were last screened, and you are at increased risk for chlamydia or gonorrhea. Ask your health care provider if you are at risk. Ask your health care provider about whether you are at high risk for HIV. Your health care provider may recommend a prescription medicine to help   prevent HIV infection. If you choose to take medicine to prevent HIV, you should first get tested for HIV. You should then be tested every 3 months for as long as you are taking the medicine. Pregnancy If you are about to stop having your period (premenopausal) and you may become pregnant, seek counseling before you get  pregnant. Take 400 to 800 micrograms (mcg) of folic acid every day if you become pregnant. Ask for birth control (contraception) if you want to prevent pregnancy. Osteoporosis and menopause Osteoporosis is a disease in which the bones lose minerals and strength with aging. This can result in bone fractures. If you are 65 years old or older, or if you are at risk for osteoporosis and fractures, ask your health care provider if you should: Be screened for bone loss. Take a calcium or vitamin D supplement to lower your risk of fractures. Be given hormone replacement therapy (HRT) to treat symptoms of menopause. Follow these instructions at home: Lifestyle Do not use any products that contain nicotine or tobacco, such as cigarettes, e-cigarettes, and chewing tobacco. If you need help quitting, ask your health care provider. Do not use street drugs. Do not share needles. Ask your health care provider for help if you need support or information about quitting drugs. Alcohol use Do not drink alcohol if: Your health care provider tells you not to drink. You are pregnant, may be pregnant, or are planning to become pregnant. If you drink alcohol: Limit how much you use to 0-1 drink a day. Limit intake if you are breastfeeding. Be aware of how much alcohol is in your drink. In the [place] one drink equals one 12 oz bottle of beer (355 mL), one 5 oz glass of wine (148 mL), or one 1 oz glass of hard liquor (44 mL). General instructions Schedule regular health, dental, and eye exams. Stay current with your vaccines. Tell your health care provider if: You often feel depressed. You have ever been abused or do not feel safe at home. Summary Adopting a healthy lifestyle and getting preventive care are important in promoting health and wellness. Follow your health care provider's instructions about healthy diet, exercising, and getting tested or screened for diseases. Follow your health care provider's  instructions on monitoring your cholesterol and blood pressure. This information is not intended to replace advice given to you by your health care provider. Make sure you discuss any questions you have with your healthcare provider. Document Revised: 03/13/2018 Document Reviewed: 03/13/2018 Elsevier Patient Education  2022 [employer] Inc.  

## 2020-11-11 NOTE — Assessment & Plan Note (Signed)
Annual exam as documented. Counseling done  re healthy lifestyle involving commitment to 150 minutes exercise per week, heart healthy diet, and attaining healthy weight.The importance of adequate sleep also discussed. Changes in health habits are decided on by the patient with goals and time frames  set for achieving them. Immunization and cancer screening needs are specifically addressed at this visit. 

## 2020-11-11 NOTE — Assessment & Plan Note (Signed)
Chronic, no warning signs currently Check CBC, CMP and TSH Age-appropriate cancer screening

## 2020-11-11 NOTE — Progress Notes (Signed)
Established Patient Office Visit  Subjective:  Patient ID: Lisa Thornton, female    DOB: [birth year]  Age: 65 y.o. MRN: [id number]  CC:  Chief Complaint  Patient presents with   Annual Exam    Annual exam pt has had low energy the past few months not sure of what this is coming from     HPI Lisa Thornton is a 65 year old female with PMH of HTN, type 2 DM and breast ca s/p mastectomy and chemotherapy who presents for annual physical.  She has been feeling tired most of the day. Denies any fever, chills, cough or recent weight loss. Denies any anhedonia or anxiety.  Her blood glucose has been around 120-140 most of the time. Denies any polyuria or polyphagia. Denies any dysuria or hematuria.  Patient agrees for referral to GI for screening colonoscopy.  She recevied first dose of Shingrix vaccine in the office today.  Past Medical History:  Diagnosis Date   Anemia 12/31/2017   Cancer ([place])    left breast cancer   Chest pain    Hypertension     Past Surgical History:  Procedure Laterality Date   MASTECTOMY MODIFIED RADICAL Left 12/31/2017   Procedure: LEFT MODIFIED RADICAL MASTECTOMY;  Surgeon: Aviva Signs, MD;  Location: [facility];  Service: General;  Laterality: Left;   PORTACATH PLACEMENT Right 06/27/2017   Procedure: INSERTION PORT-A-CATH;  Surgeon: Aviva Signs, MD;  Location: [facility];  Service: General;  Laterality: Right;    Family History  Problem Relation Age of Onset   Breast cancer Mother    Thyroid disease Mother    Heart disease Mother    Heart attack Father    Heart attack Sister    Hypertension Sister    Heart attack Brother    Cancer Sister    Stroke Brother    Alzheimer's disease Maternal Aunt     Social History   Socioeconomic History   Marital status: Divorced    Spouse name: Not on file   Number of children: 2   Years of education: Not on file   Highest education level: 10th grade  Occupational History   Not on file  Tobacco Use   Smoking  status: Never   Smokeless tobacco: Never  Vaping Use   Vaping Use: Never used  Substance and Sexual Activity   Alcohol use: No   Drug use: No   Sexual activity: Not Currently  Other Topics Concern   Not on file  Social History Narrative   Lives with sister   2 children   Dog: Cozie      Enjoys: puzzles, sewing, and walk      Diet: eats all food groups outside: leafy greens    Caffeine: limited, sweet tea   Water: 6-8 cups daily       No car; does have license, wears seat belt   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 area    Social Determinants of Health   Financial Resource Strain: Low Risk    Difficulty of Paying Living Expenses: Not hard at all  Food Insecurity: No Food Insecurity   Worried About Charity fundraiser in the Last Year: Never true   Arboriculturist in the Last Year: Never true  Transportation Needs: No Transportation Needs   Lack of Transportation (Medical): No   Lack of Transportation (Non-Medical): No  Physical Activity: Insufficiently Active   Days of Exercise per Week: 3 days   Minutes of  Exercise per Session: 30 min  Stress: No Stress Concern Present   Feeling of Stress : Not at all  Social Connections: Moderately Isolated   Frequency of Communication with Friends and Family: More than three times a week   Frequency of Social Gatherings with Friends and Family: Once a week   Attends Religious Services: More than 4 times per year   Active Member of [employer] or Organizations: No   Attends Music therapist: Never   Marital Status: Divorced  Human resources officer Violence: Not At Risk   Fear of Current or Ex-Partner: No   Emotionally Abused: No   Physically Abused: No   Sexually Abused: No    Outpatient Medications Prior to Visit  Medication Sig Dispense Refill   ACCU-CHEK GUIDE test strip USE 1 STRIP TO CHECK GLUCOSE THREE TIMES DAILY (IN  THE  MORNING,  AT  NOON  AND  IN  THE  EVENING  AT  BEDTIME  AS  DIRECTED) 100 each 0   Accu-Chek  Softclix Lancets lancets USE 1  TO CHECK GLUCOSE UP TO 4 TIMES DAILY 100 each 0   amLODipine (NORVASC) 5 MG tablet Take 1 tablet (5 mg total) by mouth daily. 30 tablet 6   anastrozole (ARIMIDEX) 1 MG tablet Take 1 tablet by mouth once daily 90 tablet 0   blood glucose meter kit and supplies Dispense based on patient and insurance preference. Use up to four times daily as directed. (FOR ICD-10 E10.9, E11.9). 1 each 0   calcium carbonate (OS-CAL) 600 MG TABS tablet Take 600 mg by mouth daily with breakfast.     cholecalciferol (VITAMIN D3) 25 MCG (1000 UT) tablet Take 1,000 Units by mouth 2 (two) times daily.      Cyanocobalamin (VITAMIN B 12 PO) Take 1 tablet by mouth daily.     hydrochlorothiazide (HYDRODIURIL) 25 MG tablet Take 1 tablet by mouth once daily 30 tablet 0   metFORMIN (GLUCOPHAGE) 1000 MG tablet TAKE 1 TABLET BY MOUTH 2 TIMES DAILY WITH A MEAL. 180 tablet 1   metoprolol tartrate (LOPRESSOR) 50 MG tablet Take 1 tablet by mouth twice daily 60 tablet 0   Omega-3 Fatty Acids (FISH OIL) 1000 MG CAPS Take 1 capsule by mouth 2 (two) times daily.     ondansetron (ZOFRAN) 4 MG tablet Take 1 tablet (4 mg total) by mouth every 8 (eight) hours as needed for nausea or vomiting. 30 tablet 2   rosuvastatin (CRESTOR) 5 MG tablet Take 1 tablet (5 mg total) by mouth daily. 90 tablet 1   ciprofloxacin (CIPRO) 500 MG tablet Take 1 tablet (500 mg total) by mouth 2 (two) times daily. 10 tablet 0   nitrofurantoin, macrocrystal-monohydrate, (MACROBID) 100 MG capsule Take 1 capsule (100 mg total) by mouth 2 (two) times daily. (Patient not taking: Reported on 11/11/2020) 10 capsule 0   No facility-administered medications prior to visit.    No Known Allergies  ROS Review of Systems  Constitutional:  Positive for fatigue. Negative for chills and fever.  HENT:  Negative for congestion, sinus pressure, sinus pain and sore throat.   Eyes:  Negative for pain and discharge.  Respiratory:  Negative for cough  and shortness of breath.   Cardiovascular:  Negative for chest pain and palpitations.  Gastrointestinal:  Negative for abdominal pain, constipation, diarrhea, nausea and vomiting.  Endocrine: Negative for polydipsia and polyuria.  Genitourinary:  Negative for dysuria and hematuria.  Musculoskeletal:  Negative for neck pain and neck stiffness.  Skin:  Negative for rash.  Neurological:  Negative for dizziness and weakness.  Psychiatric/Behavioral:  Negative for agitation and behavioral problems.      Objective:    Physical Exam Vitals reviewed.  Constitutional:      General: She is not in acute distress.    Appearance: She is not diaphoretic.  HENT:     Head: Normocephalic and atraumatic.     Nose: Nose normal. No congestion.     Mouth/Throat:     Mouth: Mucous membranes are moist.     Pharynx: No posterior oropharyngeal erythema.  Eyes:     General: No scleral icterus.    Extraocular Movements: Extraocular movements intact.  Neck:     Vascular: No carotid bruit.  Cardiovascular:     Rate and Rhythm: Normal rate and regular rhythm.     Pulses: Normal pulses.     Heart sounds: Normal heart sounds. No murmur heard. Pulmonary:     Breath sounds: Normal breath sounds. No wheezing or rales.  Abdominal:     Palpations: Abdomen is soft.     Tenderness: There is no abdominal tenderness.  Musculoskeletal:     Cervical back: Neck supple. No tenderness.     Right lower leg: No edema.     Left lower leg: No edema.  Feet:     Right foot:     Toenail Condition: Right toenails are abnormally thick. Fungal disease present.    Left foot:     Toenail Condition: Left toenails are abnormally thick. Fungal disease present. Skin:    General: Skin is warm.     Findings: No rash.  Neurological:     General: No focal deficit present.     Mental Status: She is alert and oriented to person, place, and time.     Cranial Nerves: No cranial nerve deficit.     Sensory: No sensory deficit.      Motor: No weakness.  Psychiatric:        Mood and Affect: Mood normal.        Behavior: Behavior normal.    BP 131/84 (BP Location: Left Arm, Patient Position: Sitting, Cuff Size: Normal)   Pulse (!) 107   Temp 98.1 F (36.7 C) (Oral)   Resp 18   Ht 5' 4" (1.626 m)   Wt 150 lb 6.4 oz (68.2 kg)   SpO2 99%   BMI 25.82 kg/m  Wt Readings from Last 3 Encounters:  11/11/20 150 lb 6.4 oz (68.2 kg)  06/15/20 151 lb 4.8 oz (68.6 kg)  06/11/20 154 lb (69.9 kg)     Health Maintenance Due  Topic Date Due   COVID-19 Vaccine (1) Never done   URINE MICROALBUMIN  Never done   COLONOSCOPY (Pts 45-55yr Insurance coverage will need to be confirmed)  Never done   INFLUENZA VACCINE  11/01/2020    There are no preventive care reminders to display for this patient.  Lab Results  Component Value Date   TSH 1.412 12/31/2017   Lab Results  Component Value Date   WBC 9.0 06/09/2020   HGB 11.3 (L) 06/09/2020   HCT 32.6 (L) 06/09/2020   MCV 94.5 06/09/2020   PLT 195 06/09/2020   Lab Results  Component Value Date   NA 137 06/09/2020   K 3.0 (L) 06/09/2020   CO2 25 06/09/2020   GLUCOSE 165 (H) 06/09/2020   BUN 24 (H) 06/09/2020   CREATININE 0.89 06/09/2020   BILITOT 0.6 06/09/2020   ALKPHOS 48  06/09/2020   AST 50 (H) 06/09/2020   ALT 24 06/09/2020   PROT 7.7 06/09/2020   ALBUMIN 3.9 06/09/2020   CALCIUM 9.5 06/09/2020   ANIONGAP 13 06/09/2020   No results found for: CHOL No results found for: HDL No results found for: LDLCALC No results found for: TRIG No results found for: CHOLHDL Lab Results  Component Value Date   HGBA1C 6.2 11/11/2020   HGBA1C 6.2 11/11/2020      Assessment & Plan:   Problem List Items Addressed This Visit       Annual physical exam - Primary   Annual exam as documented. Counseling done  re healthy lifestyle involving commitment to 150 minutes exercise per week, heart healthy diet, and attaining healthy weight.The importance of adequate sleep  also discussed. Changes in health habits are decided on by the patient with goals and time frames  set for achieving them. Immunization and cancer screening needs are specifically addressed at this visit.     Relevant Orders  CBC with Differential  Comprehensive metabolic panel  Lipid panel  TSH    Endocrine   Type 2 diabetes mellitus with hyperglycemia (HCC)    Lab Results  Component Value Date   HGBA1C 6.2 11/11/2020   HGBA1C 6.2 11/11/2020  On Metformin Advised to follow diabetic diet On  Crestor F/u CMP and lipid panel Diabetic foot exam: Today Diabetic eye exam: Advised to follow up with Ophthalmology for diabetic eye exam       Relevant Orders   POCT glycosylated hemoglobin (Hb A1C) (Completed)     Other      Fatigue    Chronic, no warning signs currently Check CBC, CMP and TSH Age-appropriate cancer screening      Other Visit Diagnoses     Screening for colon cancer       Relevant Orders   Ambulatory referral to Gastroenterology   Onychomycosis       Relevant Medications   terbinafine (LAMISIL) 250 MG tablet   Need for viral immunization       Relevant Orders   Varicella-zoster vaccine IM (Shingrix) (Completed)       Meds ordered this encounter  Medications   terbinafine (LAMISIL) 250 MG tablet    Sig: Take 1 tablet (250 mg total) by mouth daily.    Dispense:  90 tablet    Refill:  0    Follow-up: Return in about 4 months (around 03/13/2021) for DM and second dose of Shingrix.    Lindell Spar, MD

## 2020-11-11 NOTE — Assessment & Plan Note (Signed)
Lab Results  Component Value Date   HGBA1C 6.2 11/11/2020   HGBA1C 6.2 11/11/2020   On Metformin Advised to follow diabetic diet On  Crestor F/u CMP and lipid panel Diabetic foot exam: Today Diabetic eye exam: Advised to follow up with Ophthalmology for diabetic eye exam

## 2020-11-12 ENCOUNTER — Encounter (HOSPITAL_COMMUNITY): Payer: Self-pay | Admitting: Hematology

## 2020-11-12 LAB — CBC WITH DIFFERENTIAL/PLATELET
Basophils Absolute: 0 10*3/uL (ref 0.0–0.2)
Basos: 0 %
EOS (ABSOLUTE): 0.2 10*3/uL (ref 0.0–0.4)
Eos: 2 %
Hematocrit: 36.2 % (ref 34.0–46.6)
Hemoglobin: 12.1 g/dL (ref 11.1–15.9)
Immature Grans (Abs): 0 10*3/uL (ref 0.0–0.1)
Immature Granulocytes: 0 %
Lymphocytes Absolute: 2.9 10*3/uL (ref 0.7–3.1)
Lymphs: 29 %
MCH: 31.8 pg (ref 26.6–33.0)
MCHC: 33.4 g/dL (ref 31.5–35.7)
MCV: 95 fL (ref 79–97)
Monocytes Absolute: 0.5 10*3/uL (ref 0.1–0.9)
Monocytes: 5 %
Neutrophils Absolute: 6.2 10*3/uL (ref 1.4–7.0)
Neutrophils: 64 %
Platelets: 242 10*3/uL (ref 150–450)
RBC: 3.8 x10E6/uL (ref 3.77–5.28)
RDW: 12.7 % (ref 11.7–15.4)
WBC: 9.9 10*3/uL (ref 3.4–10.8)

## 2020-11-12 LAB — COMPREHENSIVE METABOLIC PANEL
ALT: 20 IU/L (ref 0–32)
AST: 44 IU/L — ABNORMAL HIGH (ref 0–40)
Albumin/Globulin Ratio: 1.5 (ref 1.2–2.2)
Albumin: 4.6 g/dL (ref 3.8–4.8)
Alkaline Phosphatase: 58 IU/L (ref 44–121)
BUN/Creatinine Ratio: 25 (ref 12–28)
BUN: 20 mg/dL (ref 8–27)
Bilirubin Total: 0.4 mg/dL (ref 0.0–1.2)
CO2: 24 mmol/L (ref 20–29)
Calcium: 11 mg/dL — ABNORMAL HIGH (ref 8.7–10.3)
Chloride: 98 mmol/L (ref 96–106)
Creatinine, Ser: 0.81 mg/dL (ref 0.57–1.00)
Globulin, Total: 3 g/dL (ref 1.5–4.5)
Glucose: 125 mg/dL — ABNORMAL HIGH (ref 65–99)
Potassium: 3.6 mmol/L (ref 3.5–5.2)
Sodium: 141 mmol/L (ref 134–144)
Total Protein: 7.6 g/dL (ref 6.0–8.5)
eGFR: 80 mL/min/{1.73_m2} (ref >59)

## 2020-11-12 LAB — LIPID PANEL
Chol/HDL Ratio: 3 ratio (ref 0.0–4.4)
Cholesterol, Total: 117 mg/dL (ref 100–199)
HDL: 39 mg/dL — ABNORMAL LOW (ref >39)
LDL Chol Calc (NIH): 55 mg/dL (ref 0–99)
Triglycerides: 133 mg/dL (ref 0–149)
VLDL Cholesterol Cal: 23 mg/dL (ref 5–40)

## 2020-11-12 LAB — TSH: TSH: 2.19 u[IU]/mL (ref 0.450–4.500)

## 2020-11-14 ENCOUNTER — Other Ambulatory Visit: Payer: Self-pay | Admitting: Internal Medicine

## 2020-11-15 ENCOUNTER — Encounter (INDEPENDENT_AMBULATORY_CARE_PROVIDER_SITE_OTHER): Payer: Self-pay | Admitting: *Deleted

## 2020-11-16 ENCOUNTER — Other Ambulatory Visit: Payer: Self-pay | Admitting: Family Medicine

## 2020-11-16 ENCOUNTER — Other Ambulatory Visit: Payer: Self-pay | Admitting: Internal Medicine

## 2020-11-16 DIAGNOSIS — I10 Essential (primary) hypertension: Secondary | ICD-10-CM

## 2020-11-16 DIAGNOSIS — B351 Tinea unguium: Secondary | ICD-10-CM

## 2020-11-17 ENCOUNTER — Encounter: Payer: Self-pay | Admitting: *Deleted

## 2020-12-16 ENCOUNTER — Inpatient Hospital Stay (HOSPITAL_COMMUNITY): Payer: Medicare Other | Attending: Hematology

## 2020-12-16 DIAGNOSIS — Z452 Encounter for adjustment and management of vascular access device: Secondary | ICD-10-CM | POA: Insufficient documentation

## 2020-12-16 DIAGNOSIS — E876 Hypokalemia: Secondary | ICD-10-CM | POA: Insufficient documentation

## 2020-12-16 DIAGNOSIS — Z79811 Long term (current) use of aromatase inhibitors: Secondary | ICD-10-CM | POA: Insufficient documentation

## 2020-12-16 DIAGNOSIS — C50412 Malignant neoplasm of upper-outer quadrant of left female breast: Secondary | ICD-10-CM | POA: Insufficient documentation

## 2020-12-16 DIAGNOSIS — M8580 Other specified disorders of bone density and structure, unspecified site: Secondary | ICD-10-CM | POA: Insufficient documentation

## 2020-12-16 DIAGNOSIS — Z170 Estrogen receptor positive status [ER+]: Secondary | ICD-10-CM | POA: Insufficient documentation

## 2020-12-20 ENCOUNTER — Other Ambulatory Visit: Payer: Self-pay | Admitting: Family Medicine

## 2020-12-22 ENCOUNTER — Inpatient Hospital Stay (HOSPITAL_COMMUNITY): Payer: Medicare Other | Attending: Hematology

## 2020-12-22 ENCOUNTER — Other Ambulatory Visit: Payer: Self-pay

## 2020-12-22 DIAGNOSIS — C50412 Malignant neoplasm of upper-outer quadrant of left female breast: Secondary | ICD-10-CM | POA: Insufficient documentation

## 2020-12-22 DIAGNOSIS — Z170 Estrogen receptor positive status [ER+]: Secondary | ICD-10-CM | POA: Insufficient documentation

## 2020-12-22 DIAGNOSIS — Z79811 Long term (current) use of aromatase inhibitors: Secondary | ICD-10-CM | POA: Insufficient documentation

## 2020-12-22 LAB — CBC WITH DIFFERENTIAL/PLATELET
Abs Immature Granulocytes: 0.02 10*3/uL (ref 0.00–0.07)
Basophils Absolute: 0 10*3/uL (ref 0.0–0.1)
Basophils Relative: 0 %
Eosinophils Absolute: 0.2 10*3/uL (ref 0.0–0.5)
Eosinophils Relative: 2 %
HCT: 30.9 % — ABNORMAL LOW (ref 36.0–46.0)
Hemoglobin: 10.8 g/dL — ABNORMAL LOW (ref 12.0–15.0)
Immature Granulocytes: 0 %
Lymphocytes Relative: 31 %
Lymphs Abs: 2.5 10*3/uL (ref 0.7–4.0)
MCH: 32.8 pg (ref 26.0–34.0)
MCHC: 35 g/dL (ref 30.0–36.0)
MCV: 93.9 fL (ref 80.0–100.0)
Monocytes Absolute: 0.4 10*3/uL (ref 0.1–1.0)
Monocytes Relative: 5 %
Neutro Abs: 4.9 10*3/uL (ref 1.7–7.7)
Neutrophils Relative %: 62 %
Platelets: 210 10*3/uL (ref 150–400)
RBC: 3.29 MIL/uL — ABNORMAL LOW (ref 3.87–5.11)
RDW: 12.1 % (ref 11.5–15.5)
WBC: 7.9 10*3/uL (ref 4.0–10.5)
nRBC: 0 % (ref 0.0–0.2)

## 2020-12-22 LAB — COMPREHENSIVE METABOLIC PANEL
ALT: 26 U/L (ref 0–44)
AST: 50 U/L — ABNORMAL HIGH (ref 15–41)
Albumin: 3.7 g/dL (ref 3.5–5.0)
Alkaline Phosphatase: 53 U/L (ref 38–126)
Anion gap: 10 (ref 5–15)
BUN: 25 mg/dL — ABNORMAL HIGH (ref 8–23)
CO2: 24 mmol/L (ref 22–32)
Calcium: 8.4 mg/dL — ABNORMAL LOW (ref 8.9–10.3)
Chloride: 102 mmol/L (ref 98–111)
Creatinine, Ser: 1.2 mg/dL — ABNORMAL HIGH (ref 0.44–1.00)
GFR, Estimated: 50 mL/min — ABNORMAL LOW (ref >60)
Glucose, Bld: 317 mg/dL — ABNORMAL HIGH (ref 70–99)
Potassium: 3 mmol/L — ABNORMAL LOW (ref 3.5–5.1)
Sodium: 136 mmol/L (ref 135–145)
Total Bilirubin: 0.2 mg/dL — ABNORMAL LOW (ref 0.3–1.2)
Total Protein: 7.8 g/dL (ref 6.5–8.1)

## 2020-12-22 LAB — VITAMIN B12: Vitamin B-12: 244 pg/mL (ref 180–914)

## 2020-12-22 NOTE — Progress Notes (Signed)
[facility] [address], [place] [address]   Patient Care Team: Lindell Spar, MD as PCP - General (Internal Medicine) [name], MD (Inactive) as PCP - Cardiology (Cardiology)  SUMMARY OF ONCOLOGIC HISTORY: Oncology History  Breast cancer of upper-outer quadrant of left female breast ([place])  06/22/2017 Initial Diagnosis   Breast cancer of upper-outer quadrant of left female breast ([place])   06/22/2017 Cancer Staging   Staging form: Breast, AJCC 8th Edition - Clinical stage from 06/22/2017: Stage IIIB (cT3, cN1, cM0, G3, ER+, PR-, HER2-) - Signed by Derek Jack, MD on 06/22/2017   06/29/2017 Imaging   CT chest showing left axillary adenopathy, 1.2 cm anterior carinal adenopathy, left subpectoral lymph node subcentimeter  06/25/2017 2D echocardiogram with ejection fraction of 55-60%   07/18/2017 - 12/06/2017 Chemotherapy   The patient had dexamethasone (DECADRON) 4 MG tablet, 1 of 1 cycle, Start date: --, End date: -- DOXOrubicin (ADRIAMYCIN) chemo injection 110 mg, 60 mg/m2 = 110 mg, Intravenous,  Once, 4 of 4 cycles Administration: 110 mg (07/18/2017), 110 mg (08/01/2017), 110 mg (08/20/2017), 110 mg (09/03/2017) palonosetron (ALOXI) injection 0.25 mg, 0.25 mg, Intravenous,  Once, 4 of 4 cycles Administration: 0.25 mg (07/18/2017), 0.25 mg (08/01/2017), 0.25 mg (08/20/2017), 0.25 mg (09/03/2017) pegfilgrastim-cbqv (UDENYCA) injection 6 mg, 6 mg, Subcutaneous, Once, 4 of 4 cycles Administration: 6 mg (07/19/2017), 6 mg (08/02/2017), 6 mg (08/22/2017), 6 mg (09/05/2017) cyclophosphamide (CYTOXAN) 1,100 mg in sodium chloride 0.9 % 250 mL chemo infusion, 600 mg/m2 = 1,100 mg, Intravenous,  Once, 4 of 4 cycles Administration: 1,100 mg (07/18/2017), 1,100 mg (08/01/2017), 1,100 mg (08/20/2017), 1,100 mg (09/03/2017) PACLitaxel (TAXOL) 120 mg in sodium chloride 0.9 % 250 mL chemo infusion (</= 67m/m2), 64 mg/m2 = 120 mg (80 % of original dose 80 mg/m2), Intravenous,  Once, 12 of  12 cycles Dose modification: 64 mg/m2 (80 % of original dose 80 mg/m2, Cycle 5, Reason: Provider Judgment), 60 mg/m2 (75 % of original dose 80 mg/m2, Cycle 15, Reason: Other (see comments)), 40 mg/m2 (50 % of original dose 80 mg/m2, Cycle 16, Reason: Other (see comments)) Administration: 120 mg (09/17/2017), 144 mg (09/24/2017), 144 mg (10/03/2017), 144 mg (10/11/2017), 144 mg (10/18/2017), 144 mg (10/25/2017), 144 mg (11/01/2017), 144 mg (11/08/2017), 144 mg (11/15/2017), 144 mg (11/22/2017), 108 mg (11/29/2017), 72 mg (12/06/2017) fosaprepitant (EMEND) 150 mg, dexamethasone (DECADRON) 12 mg in sodium chloride 0.9 % 145 mL IVPB, , Intravenous,  Once, 4 of 4 cycles Administration:  (07/18/2017),  (08/01/2017),  (08/20/2017),  (09/03/2017)   for chemotherapy treatment.     02/07/2018 -  Chemotherapy   The patient had trastuzumab (HERCEPTIN) 600 mg in sodium chloride 0.9 % 250 mL chemo infusion, 567 mg, Intravenous,  Once, 15 of 15 cycles Administration: 600 mg (02/07/2018), 450 mg (02/27/2018), 450 mg (05/01/2018), 450 mg (05/22/2018), 450 mg (06/13/2018), 450 mg (07/03/2018), 450 mg (07/24/2018), 450 mg (08/15/2018), 450 mg (09/06/2018), 450 mg (03/20/2018), 450 mg (09/25/2018), 450 mg (10/16/2018), 450 mg (11/06/2018), 450 mg (11/27/2018), 450 mg (04/10/2018) trastuzumab-dkst (OGIVRI) 450 mg in sodium chloride 0.9 % 250 mL chemo infusion, 420 mg (100 % of original dose 6 mg/kg), Intravenous,  Once, 4 of 4 cycles Dose modification: 6 mg/kg (original dose 6 mg/kg, Cycle 16, Reason: Other (see comments), Comment: change to biosimilar) Administration: 450 mg (12/23/2018), 450 mg (01/13/2019), 450 mg (02/10/2019), 450 mg (03/04/2019)   for chemotherapy treatment.      Radiation Therapy  CHIEF COMPLIANT: Follow-up for left breast cancer   INTERVAL HISTORY: [name] is a 65 y.o. female here today for follow up of her left breast cancer. Her last visit was on 06/15/2020.   Today she reports feeling good. She denies any new  pains. She is not currently taking any potassium. She reports occasional dysuria. She is taking anastrozole and tolerating it well. She reports mild diarrhea.   REVIEW OF SYSTEMS:   Review of Systems  Constitutional:  Negative for appetite change and fatigue (75%).  Gastrointestinal:  Positive for diarrhea.  Genitourinary:  Positive for dysuria.   Musculoskeletal:  Negative for arthralgias and myalgias.  All other systems reviewed and are negative.  I have reviewed the past medical history, past surgical history, social history and family history with the patient and they are unchanged from previous note.   ALLERGIES:   has No Known Allergies.   MEDICATIONS:  Current Outpatient Medications  Medication Sig Dispense Refill   ACCU-CHEK GUIDE test strip USE 1 STRIP TO CHECK GLUCOSE THREE TIMES DAILY (IN  THE  MORNING,  AT  NOON  AND  IN  THE  EVENING  AT  BEDTIME  AS  DIRECTED) 100 each 0   Accu-Chek Softclix Lancets lancets USE 1  TO CHECK GLUCOSE UP TO 4 TIMES DAILY 100 each 0   amLODipine (NORVASC) 5 MG tablet Take 1 tablet (5 mg total) by mouth daily. 30 tablet 6   anastrozole (ARIMIDEX) 1 MG tablet Take 1 tablet by mouth once daily 90 tablet 0   blood glucose meter kit and supplies Dispense based on patient and insurance preference. Use up to four times daily as directed. (FOR ICD-10 E10.9, E11.9). 1 each 0   calcium carbonate (OS-CAL) 600 MG TABS tablet Take 600 mg by mouth daily with breakfast.     cholecalciferol (VITAMIN D3) 25 MCG (1000 UT) tablet Take 1,000 Units by mouth 2 (two) times daily.      Cyanocobalamin (VITAMIN B 12 PO) Take 1 tablet by mouth daily.     hydrochlorothiazide (HYDRODIURIL) 25 MG tablet Take 1 tablet by mouth once daily 30 tablet 0   metFORMIN (GLUCOPHAGE) 1000 MG tablet TAKE 1 TABLET BY MOUTH 2 TIMES DAILY WITH A MEAL. 180 tablet 1   metoprolol tartrate (LOPRESSOR) 50 MG tablet Take 1 tablet by mouth twice daily 60 tablet 0   Omega-3 Fatty Acids (FISH  OIL) 1000 MG CAPS Take 1 capsule by mouth 2 (two) times daily.     ondansetron (ZOFRAN) 4 MG tablet Take 1 tablet (4 mg total) by mouth every 8 (eight) hours as needed for nausea or vomiting. 30 tablet 2   rosuvastatin (CRESTOR) 5 MG tablet Take 1 tablet (5 mg total) by mouth daily. 90 tablet 1   terbinafine (LAMISIL) 250 MG tablet Take 1 tablet (250 mg total) by mouth daily. 90 tablet 0   No current facility-administered medications for this visit.     PHYSICAL EXAMINATION: Performance status (ECOG): 1 - Symptomatic but completely ambulatory  There were no vitals filed for this visit. Wt Readings from Last 3 Encounters:  11/11/20 150 lb 6.4 oz (68.2 kg)  06/15/20 151 lb 4.8 oz (68.6 kg)  06/11/20 154 lb (69.9 kg)   Physical Exam Vitals reviewed.  Constitutional:      Appearance: Normal appearance.  Cardiovascular:     Rate and Rhythm: Normal rate and regular rhythm.     Pulses: Normal pulses.     Heart  sounds: Normal heart sounds.  Pulmonary:     Effort: Pulmonary effort is normal.     Breath sounds: Normal breath sounds.  Chest:  Breasts:    Right: No swelling, inverted nipple, mass, nipple discharge, skin change or tenderness.     Left: Absent. No swelling, mass, skin change (masectomy site within normal limits) or tenderness.  Abdominal:     Palpations: Abdomen is soft. There is no mass.     Tenderness: There is no abdominal tenderness.  Lymphadenopathy:     Upper Body:     Right upper body: No supraclavicular, axillary or pectoral adenopathy.     Left upper body: No supraclavicular, axillary or pectoral adenopathy.  Neurological:     General: No focal deficit present.     Mental Status: She is alert and oriented to person, place, and time.  Psychiatric:        Mood and Affect: Mood normal.        Behavior: Behavior normal.    Breast Exam Chaperone: Thana Ates     LABORATORY DATA:  I have reviewed the data as listed CMP Latest Ref Rng & Units 12/22/2020  11/11/2020 06/09/2020  Glucose 70 - 99 mg/dL 317(H) 125(H) 165(H)  BUN 8 - 23 mg/dL 25(H) 20 24(H)  Creatinine 0.44 - 1.00 mg/dL 1.20(H) 0.81 0.89  Sodium 135 - 145 mmol/L 136 141 137  Potassium 3.5 - 5.1 mmol/L 3.0(L) 3.6 3.0(L)  Chloride 98 - 111 mmol/L 102 98 99  CO2 22 - 32 mmol/L _0 Calcium 8.9 - 10.3 mg/dL 8.4(L) 11.0(H) 9.5  Total Protein 6.5 - 8.1 g/dL 7.8 7.6 7.7  Total Bilirubin 0.3 - 1.2 mg/dL 0.2(L) 0.4 0.6  Alkaline Phos 38 - 126 U/L 53 58 48  AST 15 - 41 U/L 50(H) 44(H) 50(H)  ALT 0 - 44 U/L _1 No results found for: [id number] Lab Results  Component Value Date   WBC 7.9 12/22/2020   HGB 10.8 (L) 12/22/2020   HCT 30.9 (L) 12/22/2020   MCV 93.9 12/22/2020   PLT 210 12/22/2020   NEUTROABS 4.9 12/22/2020    ASSESSMENT:  1.  Poorly differentiated left breast cancer stage IIIb: - On 06/12/2017 she had a left breast biopsy with sarcomatoid features, left axillary lymph node biopsy was negative. - Her 2D echo showed EF 55 to 60% - CT of the chest showed subcarinal adenopathy. - PET CT scan was negative for metastatic disease. -She had 4 cycles of neoadjuvant dose dense AC from 07/18/2017 through 09/03/2017. - She had 12 cycles of weekly paclitaxel from 09/17/2017 through 12/06/2017. -She underwent a left modified radical mastectomy on 12/31/2017. - The pathology revealed a 2.3 cm poorly differentiated invasive ductal carcinoma with sarcomatoid changes, resection margins negative, high-grade DCIS, no skin involvement, 0 out of 8 lymph nodes positive, negative for lymphovascular or perineural invasion.  ER positive, PR and HER-2 negative.  Pathological staging is [id number]. - Her receptors were rechecked on a mastectomy specimen and HER-2 was positive by IHC.  This was initially negative by [place] on biopsy specimen.  ER was negative on mastectomy specimen.  It was 60% positive on biopsy specimen.  Ki 67 has decreased from 30% to 80% on biopsy.  This was reviewed by a second  pathologist and confirmed. - It was recommended she have 1 year of Herceptin. Pertuzumab was not added secondary to lymph node negativity.  - 1 year of Herceptin completed on 03/04/2019. -Anastrozole started  on 02/27/2018.     2.  Osteopenia: - Last DEXA scan on 03/07/2018 showed T score of -1.8.   PLAN:  1.  Poorly differentiated left breast cancer stage IIIb: - Physical examination today did not reveal any palpable masses in right breast.  Left mastectomy site is within normal limits. - Mammogram on 06/09/2020 was BI-RADS Category 1. - Reviewed labs from 12/22/2020 which showed mildly elevated AST which is stable.  CBC shows mild anemia at 10.8.  Creatinine was elevated at 1.20.  Encouraged increased fluid intake. - RTC 6 months for follow-up after mammogram. - Continue anastrozole daily.   2.  Osteopenia: - Last vitamin D level was normal. - Continue calcium and vitamin D supplements. - DEXA scan on 06/18/2020 T score -1.8, osteopenia.  3.  Hypokalemia: - She is on HCTZ for her high blood pressure.  Her potassium today is 3.0. - We will start her on potassium 20 mEq daily.  Breast Cancer therapy associated bone loss: I have recommended calcium, Vitamin D and weight bearing exercises.  Orders placed this encounter:  No orders of the defined types were placed in this encounter.   The patient has a good understanding of the overall plan. She agrees with it. She will call with any problems that may develop before the next visit here.  Derek Jack, MD [facility] [phone]   I, Thana Ates, am acting as a scribe for Dr. Derek Jack.  I, Derek Jack MD, have reviewed the above documentation for accuracy and completeness, and I agree with the above.

## 2020-12-23 ENCOUNTER — Inpatient Hospital Stay (HOSPITAL_BASED_OUTPATIENT_CLINIC_OR_DEPARTMENT_OTHER): Payer: Medicare Other | Admitting: Hematology

## 2020-12-23 VITALS — BP 139/50 | HR 71 | Temp 99.0°F | Resp 18 | Wt 153.0 lb

## 2020-12-23 DIAGNOSIS — C50412 Malignant neoplasm of upper-outer quadrant of left female breast: Secondary | ICD-10-CM

## 2020-12-23 DIAGNOSIS — E876 Hypokalemia: Secondary | ICD-10-CM | POA: Diagnosis not present

## 2020-12-23 DIAGNOSIS — Z79811 Long term (current) use of aromatase inhibitors: Secondary | ICD-10-CM | POA: Diagnosis not present

## 2020-12-23 DIAGNOSIS — M8580 Other specified disorders of bone density and structure, unspecified site: Secondary | ICD-10-CM | POA: Diagnosis not present

## 2020-12-23 DIAGNOSIS — Z170 Estrogen receptor positive status [ER+]: Secondary | ICD-10-CM | POA: Diagnosis not present

## 2020-12-23 DIAGNOSIS — Z452 Encounter for adjustment and management of vascular access device: Secondary | ICD-10-CM | POA: Diagnosis not present

## 2020-12-23 MED ORDER — POTASSIUM CHLORIDE CRYS ER 20 MEQ PO TBCR: 20.0000 meq | EXTENDED_RELEASE_TABLET | Freq: Every day | ORAL | 5 refills | Status: DC

## 2020-12-23 NOTE — Patient Instructions (Addendum)
[place] at [facility] Discharge Instructions  You were seen today by Dr. Delton Coombes. He went over your recent results. You have been prescribed Potassium: take 1 tablet every day. You will be scheduled to have your port flushed every 3 months. You will be scheduled for a mammogram after 06/09/2021. Dr. Delton Coombes will see you back in 6 months for labs and follow up.   Thank you for choosing [place] at [facility] to provide your oncology and hematology care.  To afford each patient quality time with our provider, please arrive at least 15 minutes before your scheduled appointment time.   If you have a lab appointment with the [facility] please come in thru the Main Entrance and check in at the main information desk  You need to re-schedule your appointment should you arrive 10 or more minutes late.  We strive to give you quality time with our providers, and arriving late affects you and other patients whose appointments are after yours.  Also, if you no show three or more times for appointments you may be dismissed from the clinic at the providers discretion.     Again, thank you for choosing [facility]  Our hope is that these requests will decrease the amount of time that you wait before being seen by our physicians.       _____________________________________________________________  Should you have questions after your visit to [facility], please contact our office at [phone] between the hours of 8:00 a.m. and 4:30 p.m.  Voicemails left after 4:00 p.m. will not be returned until the following business day.  For prescription refill requests, have your pharmacy contact our office and allow 72 hours.    Cancer Center Support Programs:   > Cancer Support Group  2nd Tuesday of the month 1pm-2pm, Journey Room

## 2020-12-26 ENCOUNTER — Other Ambulatory Visit: Payer: Self-pay | Admitting: Family Medicine

## 2020-12-26 DIAGNOSIS — I10 Essential (primary) hypertension: Secondary | ICD-10-CM

## 2020-12-29 ENCOUNTER — Other Ambulatory Visit: Payer: Self-pay

## 2020-12-29 ENCOUNTER — Inpatient Hospital Stay (HOSPITAL_COMMUNITY): Payer: Medicare Other

## 2020-12-29 ENCOUNTER — Encounter (HOSPITAL_COMMUNITY): Payer: Self-pay

## 2020-12-29 VITALS — BP 135/66 | HR 74 | Temp 98.0°F | Resp 18

## 2020-12-29 DIAGNOSIS — Z95828 Presence of other vascular implants and grafts: Secondary | ICD-10-CM

## 2020-12-29 DIAGNOSIS — Z170 Estrogen receptor positive status [ER+]: Secondary | ICD-10-CM

## 2020-12-29 DIAGNOSIS — Z452 Encounter for adjustment and management of vascular access device: Secondary | ICD-10-CM | POA: Diagnosis not present

## 2020-12-29 DIAGNOSIS — E876 Hypokalemia: Secondary | ICD-10-CM | POA: Diagnosis not present

## 2020-12-29 DIAGNOSIS — Z79811 Long term (current) use of aromatase inhibitors: Secondary | ICD-10-CM | POA: Diagnosis not present

## 2020-12-29 DIAGNOSIS — C50412 Malignant neoplasm of upper-outer quadrant of left female breast: Secondary | ICD-10-CM | POA: Diagnosis not present

## 2020-12-29 DIAGNOSIS — M8580 Other specified disorders of bone density and structure, unspecified site: Secondary | ICD-10-CM | POA: Diagnosis not present

## 2020-12-29 MED ORDER — SODIUM CHLORIDE 0.9% FLUSH
10.0000 mL | Freq: Once | INTRAVENOUS | Status: AC
  Administered 2020-12-29: 10 mL via INTRAVENOUS

## 2020-12-29 MED ORDER — HEPARIN SOD (PORK) LOCK FLUSH 100 UNIT/ML IV SOLN
500.0000 [IU] | Freq: Once | INTRAVENOUS | Status: AC
  Administered 2020-12-29: 500 [IU] via INTRAVENOUS

## 2020-12-29 NOTE — Progress Notes (Signed)
Port flushed with good blood return noted. No bruising or swelling at site. Bandaid applied and patient discharged in satisfactory condition. VVS stable with no signs or symptoms of distressed noted. 

## 2020-12-29 NOTE — Patient Instructions (Signed)
[facility]  Discharge Instructions: ?Thank you for choosing [facility] to provide your oncology and hematology care.  ?If you have a lab appointment with the Cancer Center, please come in thru the Main Entrance and check in at the main information desk. ? ?Wear comfortable clothing and clothing appropriate for easy access to any Portacath or PICC line.  ? ?We strive to give you quality time with your provider. You may need to reschedule your appointment if you arrive late (15 or more minutes).  Arriving late affects you and other patients whose appointments are after yours.  Also, if you miss three or more appointments without notifying the office, you may be dismissed from the clinic at the provider?s discretion.    ?  ?For prescription refill requests, have your pharmacy contact our office and allow 72 hours for refills to be completed.   ? ?Today your port was flushed, return as scheduled. ?  ?To help prevent nausea and vomiting after your treatment, we encourage you to take your nausea medication as directed. ? ?BELOW ARE SYMPTOMS THAT SHOULD BE REPORTED IMMEDIATELY: ?*FEVER GREATER THAN 100.4 F (38 ?C) OR HIGHER ?*CHILLS OR SWEATING ?*NAUSEA AND VOMITING THAT IS NOT CONTROLLED WITH YOUR NAUSEA MEDICATION ?*UNUSUAL SHORTNESS OF BREATH ?*UNUSUAL BRUISING OR BLEEDING ?*URINARY PROBLEMS (pain or burning when urinating, or frequent urination) ?*BOWEL PROBLEMS (unusual diarrhea, constipation, pain near the anus) ?TENDERNESS IN MOUTH AND THROAT WITH OR WITHOUT PRESENCE OF ULCERS (sore throat, sores in mouth, or a toothache) ?UNUSUAL RASH, SWELLING OR PAIN  ?UNUSUAL VAGINAL DISCHARGE OR ITCHING  ? ?Items with * indicate a potential emergency and should be followed up as soon as possible or go to the Emergency Department if any problems should occur. ? ?Please show the CHEMOTHERAPY ALERT CARD or IMMUNOTHERAPY ALERT CARD at check-in to the Emergency Department and triage nurse. ? ?Should you  have questions after your visit or need to cancel or reschedule your appointment, please contact [facility] [phone]  and follow the prompts.  Office hours are 8:00 a.m. to 4:30 p.m. Monday - Friday. Please note that voicemails left after 4:00 p.m. may not be returned until the following business day.  We are closed weekends and major holidays. You have access to a nurse at all times for urgent questions. Please call the main number to the clinic [phone] and follow the prompts. ? ?For any non-urgent questions, you may also contact your provider using MyChart. We now offer e-Visits for anyone 18 and older to request care online for non-urgent symptoms. For details visit [email or web address]. ?  ?Also download the MyChart app! Go to the app store, search "MyChart", open the app, select [facility], and log in with your MyChart username and password. ? ?Due to Covid, a mask is required upon entering the hospital/clinic. If you do not have a mask, one will be given to you upon arrival. For doctor visits, patients may have 1 support person aged 18 or older with them. For treatment visits, patients cannot have anyone with them due to current Covid guidelines and our immunocompromised population.  ?

## 2021-01-02 ENCOUNTER — Other Ambulatory Visit: Payer: Self-pay | Admitting: Nurse Practitioner

## 2021-01-04 ENCOUNTER — Other Ambulatory Visit: Payer: Self-pay | Admitting: *Deleted

## 2021-01-04 ENCOUNTER — Other Ambulatory Visit: Payer: Self-pay | Admitting: Nurse Practitioner

## 2021-01-04 DIAGNOSIS — E1165 Type 2 diabetes mellitus with hyperglycemia: Secondary | ICD-10-CM

## 2021-01-04 MED ORDER — METFORMIN HCL 1000 MG PO TABS: ORAL_TABLET | ORAL | 1 refills | Status: DC

## 2021-01-07 ENCOUNTER — Other Ambulatory Visit: Payer: Self-pay

## 2021-01-07 DIAGNOSIS — E1165 Type 2 diabetes mellitus with hyperglycemia: Secondary | ICD-10-CM

## 2021-01-07 MED ORDER — ACCU-CHEK GUIDE VI STRP: ORAL_STRIP | 0 refills | Status: DC

## 2021-01-13 ENCOUNTER — Other Ambulatory Visit (HOSPITAL_COMMUNITY): Payer: Self-pay | Admitting: Hematology

## 2021-01-13 DIAGNOSIS — C50412 Malignant neoplasm of upper-outer quadrant of left female breast: Secondary | ICD-10-CM

## 2021-02-02 ENCOUNTER — Other Ambulatory Visit: Payer: Self-pay | Admitting: Family Medicine

## 2021-02-02 DIAGNOSIS — I10 Essential (primary) hypertension: Secondary | ICD-10-CM

## 2021-02-21 ENCOUNTER — Other Ambulatory Visit: Payer: Self-pay | Admitting: Family Medicine

## 2021-02-21 DIAGNOSIS — I10 Essential (primary) hypertension: Secondary | ICD-10-CM

## 2021-03-08 ENCOUNTER — Inpatient Hospital Stay (HOSPITAL_COMMUNITY): Payer: Medicare Other | Attending: Hematology

## 2021-03-17 ENCOUNTER — Ambulatory Visit (INDEPENDENT_AMBULATORY_CARE_PROVIDER_SITE_OTHER): Payer: Medicare Other | Admitting: Internal Medicine

## 2021-03-17 ENCOUNTER — Other Ambulatory Visit: Payer: Self-pay

## 2021-03-17 ENCOUNTER — Encounter: Payer: Self-pay | Admitting: Internal Medicine

## 2021-03-17 VITALS — BP 124/68 | HR 90 | Resp 18 | Ht 64.0 in | Wt 156.1 lb

## 2021-03-17 DIAGNOSIS — E1165 Type 2 diabetes mellitus with hyperglycemia: Secondary | ICD-10-CM | POA: Diagnosis not present

## 2021-03-17 DIAGNOSIS — N179 Acute kidney failure, unspecified: Secondary | ICD-10-CM

## 2021-03-17 DIAGNOSIS — B351 Tinea unguium: Secondary | ICD-10-CM | POA: Diagnosis not present

## 2021-03-17 DIAGNOSIS — E876 Hypokalemia: Secondary | ICD-10-CM | POA: Diagnosis not present

## 2021-03-17 DIAGNOSIS — I4891 Unspecified atrial fibrillation: Secondary | ICD-10-CM

## 2021-03-17 DIAGNOSIS — D509 Iron deficiency anemia, unspecified: Secondary | ICD-10-CM | POA: Diagnosis not present

## 2021-03-17 DIAGNOSIS — E1169 Type 2 diabetes mellitus with other specified complication: Secondary | ICD-10-CM | POA: Diagnosis not present

## 2021-03-17 DIAGNOSIS — Z23 Encounter for immunization: Secondary | ICD-10-CM

## 2021-03-17 DIAGNOSIS — I10 Essential (primary) hypertension: Secondary | ICD-10-CM

## 2021-03-17 MED ORDER — METOPROLOL TARTRATE 50 MG PO TABS
50.0000 mg | ORAL_TABLET | Freq: Two times a day (BID) | ORAL | 1 refills | Status: DC
Start: 2021-03-17 — End: 2021-11-04

## 2021-03-17 NOTE — Assessment & Plan Note (Signed)
Noted in last BMP Has improved hydration since then Check CMP today

## 2021-03-17 NOTE — Assessment & Plan Note (Addendum)
Lab Results  Component Value Date   HGBA1C 6.2 11/11/2020   HGBA1C 6.2 11/11/2020   With HTN and HLD On Metformin Advised to follow diabetic diet On statin F/u CMP and lipid panel Diabetic eye exam: Advised to follow up with Ophthalmology for diabetic eye exam

## 2021-03-17 NOTE — Assessment & Plan Note (Signed)
Had single episode in 2019, has been in sinus rhythm since then Has seen cardiology in the past -asymptomatic, and since she has isolated episode, it was decided to keep her on only rate controlling agent and not AC. Will get EKG in the next visit.

## 2021-03-17 NOTE — Assessment & Plan Note (Signed)
Did not tolerate KCl tablet Will check CMP and start liquid K supplement if needed

## 2021-03-17 NOTE — Assessment & Plan Note (Signed)
BP Readings from Last 1 Encounters:  03/17/21 124/68   Well-controlled with amlodipine, HCTZ and metoprolol Counseled for compliance with the medications Advised DASH diet and moderate exercise/walking, at least 150 mins/week

## 2021-03-17 NOTE — Patient Instructions (Signed)
Please continue to take medications as prescribed.  Please continue to follow low carb diet and ambulate as tolerated. 

## 2021-03-17 NOTE — Progress Notes (Signed)
Established Patient Office Visit  Subjective:  Patient ID: Lisa Thornton, female    DOB: 03-03-1956  Age: 66 y.o. MRN: [id number]  CC:  Chief Complaint  Patient presents with   Follow-up    4 month follow up shingrix 2 would like to see if potassium can be changed from 20 to 10 cant swallow 20     HPI Lisa Thornton is a 65 y.o. female with past medical history of HTN, type 2 DM and breast ca s/p mastectomy and chemotherapy who presents for f/u of her chronic medical conditions.  Type II DM: Her blood glucose has been around 120-140 most of the time. Denies any polyuria or polyphagia. Denies any dysuria or hematuria.  Hypokalemia and AKI: Noted in last BMP during the last visit.  She was given potassium 20 mEq, but she stopped taking it as she was having dysphagia with it.  Her energy level has improved compared to last visit.  She used to take vitamin B12 supplements in the past and agrees to start taking it again.  HTN: BP is well-controlled. Takes medications regularly. Patient denies headache, dizziness, chest pain, dyspnea or palpitations.  She received second dose of Shingrix vaccine and flu vaccine in the office today.   Past Medical History:  Diagnosis Date   Anemia 12/31/2017   Cancer ([place])    left breast cancer   Chest pain    Hypertension     Past Surgical History:  Procedure Laterality Date   MASTECTOMY MODIFIED RADICAL Left 12/31/2017   Procedure: LEFT MODIFIED RADICAL MASTECTOMY;  Surgeon: Aviva Signs, MD;  Location: [facility];  Service: General;  Laterality: Left;   PORTACATH PLACEMENT Right 06/27/2017   Procedure: INSERTION PORT-A-CATH;  Surgeon: Aviva Signs, MD;  Location: [facility];  Service: General;  Laterality: Right;    Family History  Problem Relation Age of Onset   Breast cancer Mother    Thyroid disease Mother    Heart disease Mother    Heart attack Father    Heart attack Sister    Hypertension Sister    Heart attack Brother    Cancer Sister     Stroke Brother    Alzheimer's disease Maternal Aunt     Social History   Socioeconomic History   Marital status: Divorced    Spouse name: Not on file   Number of children: 2   Years of education: Not on file   Highest education level: 10th grade  Occupational History   Not on file  Tobacco Use   Smoking status: Never   Smokeless tobacco: Never  Vaping Use   Vaping Use: Never used  Substance and Sexual Activity   Alcohol use: No   Drug use: No   Sexual activity: Not Currently  Other Topics Concern   Not on file  Social History Narrative   Lives with sister   2 children   Dog: Cozie      Enjoys: puzzles, sewing, and walk      Diet: eats all food groups outside: leafy greens    Caffeine: limited, sweet tea   Water: 6-8 cups daily       No car; does have license, wears seat belt   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 area    Social Determinants of Health   Financial Resource Strain: Low Risk    Difficulty of Paying Living Expenses: Not hard at all  Food Insecurity: No Food Insecurity   Worried About Running Out  of Food in the Last Year: Never true   [place] in the Last Year: Never true  Transportation Needs: No Transportation Needs   Lack of Transportation (Medical): No   Lack of Transportation (Non-Medical): No  Physical Activity: Insufficiently Active   Days of Exercise per Week: 3 days   Minutes of Exercise per Session: 30 min  Stress: No Stress Concern Present   Feeling of Stress : Not at all  Social Connections: Moderately Isolated   Frequency of Communication with Friends and Family: More than three times a week   Frequency of Social Gatherings with Friends and Family: Once a week   Attends Religious Services: More than 4 times per year   Active Member of [employer] or Organizations: No   Attends Archivist Meetings: Never   Marital Status: Divorced  Human resources officer Violence: Not At Risk   Fear of Current or Ex-Partner: No   Emotionally  Abused: No   Physically Abused: No   Sexually Abused: No    Outpatient Medications Prior to Visit  Medication Sig Dispense Refill   Accu-Chek Softclix Lancets lancets USE 1  TO CHECK GLUCOSE UP TO 4 TIMES DAILY 100 each 0   amLODipine (NORVASC) 5 MG tablet Take 1 tablet (5 mg total) by mouth daily. 30 tablet 6   anastrozole (ARIMIDEX) 1 MG tablet Take 1 tablet by mouth once daily 90 tablet 2   blood glucose meter kit and supplies Dispense based on patient and insurance preference. Use up to four times daily as directed. (FOR ICD-10 E10.9, E11.9). 1 each 0   calcium carbonate (OS-CAL) 600 MG TABS tablet Take 600 mg by mouth daily with breakfast.     cholecalciferol (VITAMIN D3) 25 MCG (1000 UT) tablet Take 1,000 Units by mouth 2 (two) times daily.      Cyanocobalamin (VITAMIN B 12 PO) Take 1 tablet by mouth daily.     glucose blood (ACCU-CHEK GUIDE) test strip USE 1 STRIP TO CHECK GLUCOSE THREE TIMES DAILY IN  THE  MORNING,  NOON  AND  IN  THE  EVENING  AT  BEDTIME  AS  DIRECTED 100 each 0   hydrochlorothiazide (HYDRODIURIL) 25 MG tablet Take 1 tablet by mouth once daily 30 tablet 0   metFORMIN (GLUCOPHAGE) 1000 MG tablet TAKE 1 TABLET BY MOUTH 2 TIMES DAILY WITH A MEAL. 180 tablet 1   Omega-3 Fatty Acids (FISH OIL) 1000 MG CAPS Take 1 capsule by mouth 2 (two) times daily.     ondansetron (ZOFRAN) 4 MG tablet Take 1 tablet (4 mg total) by mouth every 8 (eight) hours as needed for nausea or vomiting. 30 tablet 2   rosuvastatin (CRESTOR) 5 MG tablet Take 1 tablet by mouth once daily 90 tablet 0   metoprolol tartrate (LOPRESSOR) 50 MG tablet Take 1 tablet by mouth twice daily 60 tablet 0   potassium chloride SA (KLOR-CON) 20 MEQ tablet Take 1 tablet (20 mEq total) by mouth daily. 30 tablet 5   terbinafine (LAMISIL) 250 MG tablet Take 1 tablet (250 mg total) by mouth daily. 90 tablet 0   No facility-administered medications prior to visit.    No Known Allergies  ROS Review of Systems   Constitutional:  Positive for fatigue. Negative for chills and fever.  HENT:  Negative for congestion, sinus pressure, sinus pain and sore throat.   Eyes:  Negative for pain and discharge.  Respiratory:  Negative for cough and shortness of breath.  Cardiovascular:  Negative for chest pain and palpitations.  Gastrointestinal:  Negative for abdominal pain, constipation, diarrhea, nausea and vomiting.  Endocrine: Negative for polydipsia and polyuria.  Genitourinary:  Negative for dysuria and hematuria.  Musculoskeletal:  Negative for neck pain and neck stiffness.  Skin:  Negative for rash.  Neurological:  Negative for dizziness and weakness.  Psychiatric/Behavioral:  Negative for agitation and behavioral problems.      Objective:    Physical Exam Vitals reviewed.  Constitutional:      General: She is not in acute distress.    Appearance: She is not diaphoretic.  HENT:     Head: Normocephalic and atraumatic.     Nose: Nose normal. No congestion.     Mouth/Throat:     Mouth: Mucous membranes are moist.     Pharynx: No posterior oropharyngeal erythema.  Eyes:     General: No scleral icterus.    Extraocular Movements: Extraocular movements intact.  Neck:     Vascular: No carotid bruit.  Cardiovascular:     Rate and Rhythm: Normal rate and regular rhythm.     Pulses: Normal pulses.     Heart sounds: Normal heart sounds. No murmur heard. Pulmonary:     Breath sounds: Normal breath sounds. No wheezing or rales.  Abdominal:     Palpations: Abdomen is soft.     Tenderness: There is no abdominal tenderness.  Musculoskeletal:     Cervical back: Neck supple. No tenderness.     Right lower leg: No edema.     Left lower leg: No edema.  Feet:     Right foot:     Toenail Condition: Right toenails are abnormally thick. Fungal disease present.    Left foot:     Toenail Condition: Left toenails are abnormally thick. Fungal disease present. Skin:    General: Skin is warm.      Findings: No rash.  Neurological:     General: No focal deficit present.     Mental Status: She is alert and oriented to person, place, and time.     Cranial Nerves: No cranial nerve deficit.     Sensory: No sensory deficit.     Motor: No weakness.  Psychiatric:        Mood and Affect: Mood normal.        Behavior: Behavior normal.    BP 124/68 (BP Location: Right Arm, Patient Position: Sitting, Cuff Size: Normal)    Pulse 90    Resp 18    Ht _0  (1.626 m)    Wt 156 lb 1.3 oz (70.8 kg)    SpO2 99%    BMI 26.79 kg/m  Wt Readings from Last 3 Encounters:  03/17/21 156 lb 1.3 oz (70.8 kg)  12/23/20 153 lb (69.4 kg)  11/11/20 150 lb 6.4 oz (68.2 kg)    Lab Results  Component Value Date   TSH 2.190 11/11/2020   Lab Results  Component Value Date   WBC 7.9 12/22/2020   HGB 10.8 (L) 12/22/2020   HCT 30.9 (L) 12/22/2020   MCV 93.9 12/22/2020   PLT 210 12/22/2020   Lab Results  Component Value Date   NA 136 12/22/2020   K 3.0 (L) 12/22/2020   CO2 24 12/22/2020   GLUCOSE 317 (H) 12/22/2020   BUN 25 (H) 12/22/2020   CREATININE 1.20 (H) 12/22/2020   BILITOT 0.2 (L) 12/22/2020   ALKPHOS 53 12/22/2020   AST 50 (H) 12/22/2020   ALT 26 12/22/2020  PROT 7.8 12/22/2020   ALBUMIN 3.7 12/22/2020   CALCIUM 8.4 (L) 12/22/2020   ANIONGAP 10 12/22/2020   EGFR 80 11/11/2020   Lab Results  Component Value Date   CHOL 117 11/11/2020   Lab Results  Component Value Date   HDL 39 (L) 11/11/2020   Lab Results  Component Value Date   LDLCALC 55 11/11/2020   Lab Results  Component Value Date   TRIG 133 11/11/2020   Lab Results  Component Value Date   CHOLHDL 3.0 11/11/2020   Lab Results  Component Value Date   HGBA1C 6.2 11/11/2020   HGBA1C 6.2 11/11/2020      Assessment & Plan:   Problem List Items Addressed This Visit       Cardiovascular and Mediastinum   Hypertension - Primary    BP Readings from Last 1 Encounters:  03/17/21 124/68  Well-controlled with  amlodipine, HCTZ and metoprolol Counseled for compliance with the medications Advised DASH diet and moderate exercise/walking, at least 150 mins/week        Relevant Medications   metoprolol tartrate (LOPRESSOR) 50 MG tablet   Atrial fibrillation ([place])    Had single episode in 2019, has been in sinus rhythm since then Has seen cardiology in the past -asymptomatic, and since she has isolated episode, it was decided to keep her on only rate controlling agent and not AC. Will get EKG in the next visit.      Relevant Medications   metoprolol tartrate (LOPRESSOR) 50 MG tablet     Endocrine   Type 2 diabetes mellitus with other specified complication (HCC)    Lab Results  Component Value Date   HGBA1C 6.2 11/11/2020   HGBA1C 6.2 11/11/2020  With HTN and HLD On Metformin Advised to follow diabetic diet On statin F/u CMP and lipid panel Diabetic eye exam: Advised to follow up with Ophthalmology for diabetic eye exam       Relevant Orders   CMP14+EGFR   Hemoglobin A1c     Musculoskeletal and Integument   Onychomycosis    Completed Terbinafine for 3 months Still has thickened and ingrown toenail, needs to see Podiatry, wants to wait for now        Genitourinary   AKI (acute kidney injury) ([place])    Noted in last BMP Has improved hydration since then Check CMP today      Relevant Orders   CMP14+EGFR     Other   Hypokalemia    Did not tolerate KCl tablet Will check CMP and start liquid K supplement if needed      Relevant Orders   CMP14+EGFR   Other Visit Diagnoses     Iron deficiency anemia, unspecified iron deficiency anemia type       Relevant Orders   CBC with Differential/Platelet       Meds ordered this encounter  Medications   metoprolol tartrate (LOPRESSOR) 50 MG tablet    Sig: Take 1 tablet (50 mg total) by mouth 2 (two) times daily.    Dispense:  180 tablet    Refill:  1    Follow-up: Return in about 4 months (around 07/16/2021) for HTN, DM  and EKG.    Lindell Spar, MD

## 2021-03-17 NOTE — Assessment & Plan Note (Signed)
Completed Terbinafine for 3 months Still has thickened and ingrown toenail, needs to see Podiatry, wants to wait for now

## 2021-03-17 NOTE — Addendum Note (Signed)
Addended by: Zacarias Pontes R on: 03/17/2021 03:39 PM   Modules accepted: Orders

## 2021-03-18 ENCOUNTER — Encounter (HOSPITAL_COMMUNITY): Payer: Self-pay | Admitting: Hematology

## 2021-03-19 ENCOUNTER — Encounter (HOSPITAL_COMMUNITY): Payer: Self-pay | Admitting: Hematology

## 2021-03-19 LAB — CBC WITH DIFFERENTIAL/PLATELET
Basophils Absolute: 0.1 10*3/uL (ref 0.0–0.2)
Basos: 1 %
EOS (ABSOLUTE): 0.3 10*3/uL (ref 0.0–0.4)
Eos: 3 %
Hematocrit: 35.2 % (ref 34.0–46.6)
Hemoglobin: 12.4 g/dL (ref 11.1–15.9)
Immature Grans (Abs): 0 10*3/uL (ref 0.0–0.1)
Immature Granulocytes: 0 %
Lymphocytes Absolute: 4.1 10*3/uL — ABNORMAL HIGH (ref 0.7–3.1)
Lymphs: 36 %
MCH: 32.8 pg (ref 26.6–33.0)
MCHC: 35.2 g/dL (ref 31.5–35.7)
MCV: 93 fL (ref 79–97)
Monocytes Absolute: 0.6 10*3/uL (ref 0.1–0.9)
Monocytes: 6 %
Neutrophils Absolute: 6.2 10*3/uL (ref 1.4–7.0)
Neutrophils: 54 %
Platelets: 248 10*3/uL (ref 150–450)
RBC: 3.78 x10E6/uL (ref 3.77–5.28)
RDW: 13.1 % (ref 11.7–15.4)
WBC: 11.3 10*3/uL — ABNORMAL HIGH (ref 3.4–10.8)

## 2021-03-19 LAB — HEMOGLOBIN A1C
Est. average glucose Bld gHb Est-mCnc: 134 mg/dL
Hgb A1c MFr Bld: 6.3 % — ABNORMAL HIGH (ref 4.8–5.6)

## 2021-03-19 LAB — CMP14+EGFR
ALT: 29 IU/L (ref 0–32)
AST: 55 IU/L — ABNORMAL HIGH (ref 0–40)
Albumin/Globulin Ratio: 1.3 (ref 1.2–2.2)
Albumin: 4.5 g/dL (ref 3.8–4.8)
Alkaline Phosphatase: 68 IU/L (ref 44–121)
BUN/Creatinine Ratio: 17 (ref 12–28)
BUN: 16 mg/dL (ref 8–27)
Bilirubin Total: 0.3 mg/dL (ref 0.0–1.2)
CO2: 26 mmol/L (ref 20–29)
Calcium: 10.6 mg/dL — ABNORMAL HIGH (ref 8.7–10.3)
Chloride: 96 mmol/L (ref 96–106)
Creatinine, Ser: 0.96 mg/dL (ref 0.57–1.00)
Globulin, Total: 3.6 g/dL (ref 1.5–4.5)
Glucose: 101 mg/dL — ABNORMAL HIGH (ref 70–99)
Potassium: 4.2 mmol/L (ref 3.5–5.2)
Sodium: 138 mmol/L (ref 134–144)
Total Protein: 8.1 g/dL (ref 6.0–8.5)
eGFR: 65 mL/min/{1.73_m2} (ref >59)

## 2021-03-29 ENCOUNTER — Other Ambulatory Visit: Payer: Self-pay

## 2021-03-29 ENCOUNTER — Ambulatory Visit (INDEPENDENT_AMBULATORY_CARE_PROVIDER_SITE_OTHER): Payer: Medicare Other

## 2021-03-29 DIAGNOSIS — Z0000 Encounter for general adult medical examination without abnormal findings: Secondary | ICD-10-CM | POA: Diagnosis not present

## 2021-03-29 NOTE — Patient Instructions (Addendum)
Lisa Thornton , Thank you for taking time to come for your Medicare Wellness Visit. I appreciate your ongoing commitment to your health goals. Please review the following plan we discussed and let me know if I can assist you in the future.   These are the goals we discussed:  Goals   None     This is a list of the screening recommended for you and due dates:  Health Maintenance  Topic Date Due   Pneumonia Vaccine (1 - PCV) Never done   Urine Protein Check  Never done   Hepatitis C Screening: USPSTF Recommendation to screen - Ages 81-79 yo.  Never done   Tetanus Vaccine  Never done   Colon Cancer Screening  Never done   COVID-19 Vaccine (1) 04/02/2021*   Hemoglobin A1C  09/15/2021   Eye exam for diabetics  09/28/2021   Complete foot exam   11/11/2021   Mammogram  06/10/2022   Flu Shot  Completed   DEXA scan (bone density measurement)  Completed   Zoster (Shingles) Vaccine  Completed   HPV Vaccine  Aged Out  *Topic was postponed. The date shown is not the original due date.    Health Maintenance, Female Adopting a healthy lifestyle and getting preventive care are important in promoting health and wellness. Ask your health care provider about: The right schedule for you to have regular tests and exams. Things you can do on your own to prevent diseases and keep yourself healthy. What should I know about diet, weight, and exercise? Eat a healthy diet  Eat a diet that includes plenty of vegetables, fruits, low-fat dairy products, and lean protein. Do not eat a lot of foods that are high in solid fats, added sugars, or sodium. Maintain a healthy weight Body mass index (BMI) is used to identify weight problems. It estimates body fat based on height and weight. Your health care provider can help determine your BMI and help you achieve or maintain a healthy weight. Get regular exercise Get regular exercise. This is one of the most important things you can do for your health. Most adults  should: Exercise for at least 150 minutes each week. The exercise should increase your heart rate and make you sweat (moderate-intensity exercise). Do strengthening exercises at least twice a week. This is in addition to the moderate-intensity exercise. Spend less time sitting. Even light physical activity can be beneficial. Watch cholesterol and blood lipids Have your blood tested for lipids and cholesterol at 67 years of age, then have this test every 5 years. Have your cholesterol levels checked more often if: Your lipid or cholesterol levels are high. You are older than 67 years of age. You are at high risk for heart disease. What should I know about cancer screening? Depending on your health history and family history, you may need to have cancer screening at various ages. This may include screening for: Breast cancer. Cervical cancer. Colorectal cancer. Skin cancer. Lung cancer. What should I know about heart disease, diabetes, and high blood pressure? Blood pressure and heart disease High blood pressure causes heart disease and increases the risk of stroke. This is more likely to develop in people who have high blood pressure readings or are overweight. Have your blood pressure checked: Every 3-5 years if you are 102-66 years of age. Every year if you are 17 years old or older. Diabetes Have regular diabetes screenings. This checks your fasting blood sugar level. Have the screening done: Once every  three years after age 87 if you are at a normal weight and have a low risk for diabetes. More often and at a younger age if you are overweight or have a high risk for diabetes. What should I know about preventing infection? Hepatitis B If you have a higher risk for hepatitis B, you should be screened for this virus. Talk with your health care provider to find out if you are at risk for hepatitis B infection. Hepatitis C Testing is recommended for: Everyone born from [birth year] through  1965. Anyone with known risk factors for hepatitis C. Sexually transmitted infections (STIs) Get screened for STIs, including gonorrhea and chlamydia, if: You are sexually active and are younger than 67 years of age. You are older than 67 years of age and your health care provider tells you that you are at risk for this type of infection. Your sexual activity has changed since you were last screened, and you are at increased risk for chlamydia or gonorrhea. Ask your health care provider if you are at risk. Ask your health care provider about whether you are at high risk for HIV. Your health care provider may recommend a prescription medicine to help prevent HIV infection. If you choose to take medicine to prevent HIV, you should first get tested for HIV. You should then be tested every 3 months for as long as you are taking the medicine. Pregnancy If you are about to stop having your period (premenopausal) and you may become pregnant, seek counseling before you get pregnant. Take 400 to 800 micrograms (mcg) of folic acid every day if you become pregnant. Ask for birth control (contraception) if you want to prevent pregnancy. Osteoporosis and menopause Osteoporosis is a disease in which the bones lose minerals and strength with aging. This can result in bone fractures. If you are 28 years old or older, or if you are at risk for osteoporosis and fractures, ask your health care provider if you should: Be screened for bone loss. Take a calcium or vitamin D supplement to lower your risk of fractures. Be given hormone replacement therapy (HRT) to treat symptoms of menopause. Follow these instructions at home: Alcohol use Do not drink alcohol if: Your health care provider tells you not to drink. You are pregnant, may be pregnant, or are planning to become pregnant. If you drink alcohol: Limit how much you have to: 0-1 drink a day. Know how much alcohol is in your drink. In the [place] one drink  equals one 12 oz bottle of beer (355 mL), one 5 oz glass of wine (148 mL), or one 1 oz glass of hard liquor (44 mL). Lifestyle Do not use any products that contain nicotine or tobacco. These products include cigarettes, chewing tobacco, and vaping devices, such as e-cigarettes. If you need help quitting, ask your health care provider. Do not use street drugs. Do not share needles. Ask your health care provider for help if you need support or information about quitting drugs. General instructions Schedule regular health, dental, and eye exams. Stay current with your vaccines. Tell your health care provider if: You often feel depressed. You have ever been abused or do not feel safe at home. Summary Adopting a healthy lifestyle and getting preventive care are important in promoting health and wellness. Follow your health care provider's instructions about healthy diet, exercising, and getting tested or screened for diseases. Follow your health care provider's instructions on monitoring your cholesterol and blood pressure. This information is not  intended to replace advice given to you by your health care provider. Make sure you discuss any questions you have with your health care provider. Document Revised: 08/09/2020 Document Reviewed: 08/09/2020 Elsevier Patient Education  [place].

## 2021-03-29 NOTE — Progress Notes (Signed)
Subjective:   Lisa Thornton is a 65 y.o. female who presents for Medicare Annual (Subsequent) preventive examination. I connected with  Lisa Thornton on 03/29/21 by a audio enabled telemedicine application and verified that I am speaking with the correct person using two identifiers.  Patient Location: Home  Provider Location: Office/Clinic  I discussed the limitations of evaluation and management by telemedicine. The patient expressed understanding and agreed to proceed.  Review of Systems    Defer to PCP       Objective:    There were no vitals filed for this visit. There is no height or weight on file to calculate BMI.  Advanced Directives 03/29/2021 12/29/2020 12/23/2020 06/15/2020 03/25/2020 10/24/2019 06/17/2019  Does Patient Have a Medical Advance Directive? _0  No No  Would patient like information on creating a medical advance directive? No - Patient declined No - Patient declined No - Patient declined No - Patient declined No - Patient declined No - Patient declined No - Patient declined    Current Medications (verified) Outpatient Encounter Medications as of 03/29/2021  Medication Sig   Accu-Chek Softclix Lancets lancets USE 1  TO CHECK GLUCOSE UP TO 4 TIMES DAILY   amLODipine (NORVASC) 5 MG tablet Take 1 tablet (5 mg total) by mouth daily.   anastrozole (ARIMIDEX) 1 MG tablet Take 1 tablet by mouth once daily   blood glucose meter kit and supplies Dispense based on patient and insurance preference. Use up to four times daily as directed. (FOR ICD-10 E10.9, E11.9).   calcium carbonate (OS-CAL) 600 MG TABS tablet Take 600 mg by mouth daily with breakfast.   cholecalciferol (VITAMIN D3) 25 MCG (1000 UT) tablet Take 1,000 Units by mouth 2 (two) times daily.    Cyanocobalamin (VITAMIN B 12 PO) Take 1 tablet by mouth daily.   glucose blood (ACCU-CHEK GUIDE) test strip USE 1 STRIP TO CHECK GLUCOSE THREE TIMES DAILY IN  THE  MORNING,  NOON  AND  IN  THE  EVENING  AT   BEDTIME  AS  DIRECTED   hydrochlorothiazide (HYDRODIURIL) 25 MG tablet Take 1 tablet by mouth once daily   metFORMIN (GLUCOPHAGE) 1000 MG tablet TAKE 1 TABLET BY MOUTH 2 TIMES DAILY WITH A MEAL.   metoprolol tartrate (LOPRESSOR) 50 MG tablet Take 1 tablet (50 mg total) by mouth 2 (two) times daily.   Omega-3 Fatty Acids (FISH OIL) 1000 MG CAPS Take 1 capsule by mouth 2 (two) times daily.   ondansetron (ZOFRAN) 4 MG tablet Take 1 tablet (4 mg total) by mouth every 8 (eight) hours as needed for nausea or vomiting.   rosuvastatin (CRESTOR) 5 MG tablet Take 1 tablet by mouth once daily   No facility-administered encounter medications on file as of 03/29/2021.    Allergies (verified) Patient has no known allergies.   History: Past Medical History:  Diagnosis Date   Anemia 12/31/2017   Cancer ([place])    left breast cancer   Chest pain    Hypertension    Past Surgical History:  Procedure Laterality Date   MASTECTOMY MODIFIED RADICAL Left 12/31/2017   Procedure: LEFT MODIFIED RADICAL MASTECTOMY;  Surgeon: Aviva Signs, MD;  Location: [facility];  Service: General;  Laterality: Left;   PORTACATH PLACEMENT Right 06/27/2017   Procedure: INSERTION PORT-A-CATH;  Surgeon: Aviva Signs, MD;  Location: [facility];  Service: General;  Laterality: Right;   Family History  Problem Relation Age of Onset   Breast cancer Mother  Thyroid disease Mother    Heart disease Mother    Heart attack Father    Heart attack Sister    Hypertension Sister    Heart attack Brother    Cancer Sister    Stroke Brother    Alzheimer's disease Maternal Aunt    Social History   Socioeconomic History   Marital status: Divorced    Spouse name: Not on file   Number of children: 2   Years of education: 10   Highest education level: 10th grade  Occupational History   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 area    Social Determinants of Health   Financial Resource Strain: Low Risk    Difficulty of Paying Living Expenses: Not hard at all  Food Insecurity: No Food Insecurity   Worried About Charity fundraiser in the Last Year: Never true   Arboriculturist in the Last Year: Never true  Transportation Needs: No Transportation Needs   Lack of Transportation (Medical): No   Lack of Transportation (Non-Medical): No  Physical Activity: Insufficiently Active   Days of Exercise per Week: 7 days   Minutes of Exercise per Session: 20 min  Stress: No Stress Concern Present   Feeling of Stress : Not at all  Social Connections: Socially Isolated   Frequency of Communication with Friends and Family: More than three times a week   Frequency of Social Gatherings with Friends and Family: Once a week   Attends Religious Services: Never   Marine scientist or Organizations: No   Attends Music therapist: Never   Marital Status: Divorced    Tobacco Counseling Counseling given: Not Answered   Clinical Intake:  Pre-visit preparation completed: No  Pain : No/denies pain     Nutritional Risks: None Diabetes: Yes CBG done?: No Did pt. bring in CBG monitor from home?: No  How often do you need to have someone help you when you read instructions, pamphlets, or other written materials from your doctor or pharmacy?: 1 - Never What is the last grade level you completed in school?: 10th  Diabetic?Nutrition Risk Assessment:  Has the patient had any N/V/D  within the last 2 months?  No  Does the patient have any non-healing wounds?  No  Has the patient had any unintentional weight loss or weight gain?  No   Diabetes:  Is the patient diabetic?  Yes  If diabetic, was a CBG obtained today?  No  Did the patient bring in their glucometer from home?  No  How often do you monitor your CBG's? N/A.   Financial Strains and Diabetes Management:  Are you having any financial strains with the device, your supplies or your medication? No .  Does the patient  want to be seen by Chronic Care Management for management of their diabetes?  No  Would the patient like to be referred to a Nutritionist or for Diabetic Management?  No   Diabetic Exams:  Diabetic Eye Exam: Completed 09/28/2020 Diabetic Foot Exam: Completed 11/11/2020    Interpreter Needed?: No  Information entered by :: Judeen Hammans   Activities of Daily Living No flowsheet data found.  Patient Care Team: Lindell Spar, MD as PCP - General (Internal Medicine) [name], MD (Inactive) as PCP - Cardiology (Cardiology)  Indicate any recent Medical Services you may have received from other than [facility] providers in the past year (date may be approximate).     Assessment:   This is a routine wellness examination for Lisa Thornton.  Hearing/Vision screen No results found.  Dietary issues and exercise activities discussed:     Goals Addressed             This Visit's Progress    COMPLETED: DIET - INCREASE WATER INTAKE        Depression Screen PHQ 2/9 Scores 03/29/2021 03/17/2021 11/11/2020 06/11/2020 03/25/2020 03/24/2020 12/24/2019  PHQ - 2 Score 0 0 0 0 0 0 0    Fall Risk Fall Risk  03/29/2021 03/17/2021 11/11/2020 06/11/2020 03/25/2020  Falls in the past year? 0 0 0 0 0  Number falls in past yr: 0 0 0 0 0  Injury with Fall? 0 0 0 0 0  Risk for fall due to : _0   Follow up Falls evaluation completed Falls  evaluation completed Falls evaluation completed Falls evaluation completed Falls evaluation completed    [place]:  Any stairs in or around the home? Yes  If so, are there any without handrails? Yes  Home free of loose throw rugs in walkways, pet beds, electrical cords, etc? No  Adequate lighting in your home to reduce risk of falls? Yes   ASSISTIVE DEVICES UTILIZED TO PREVENT FALLS:  Life alert? No  Use of a cane, walker or w/c? No  Grab bars in the bathroom? No  Shower chair or bench in shower? No  Elevated toilet seat or a handicapped toilet? Yes   Cognitive Function:     6CIT Screen 03/25/2020  What Year? 0 points  What month? 0 points  What time? 0 points  Count back from 20 0 points  Months in reverse 0 points  Repeat phrase 10 points  Total Score 10    Immunizations Immunization History  Administered Date(s) Administered   Fluad Quad(high Dose 65+) 12/24/2019, 03/17/2021   Influenza,inj,Quad PF,6+ Mos 01/15/2018, 01/13/2019   Zoster Recombinat (Shingrix) 11/11/2020, 03/17/2021    TDAP status: Due, Education has been provided regarding the importance of this vaccine. Advised may receive this vaccine at local pharmacy or Health Dept. Aware to provide a copy of the vaccination record if obtained from local pharmacy or Health Dept. Verbalized acceptance and understanding.  Flu Vaccine status: Up to date  Pneumococcal vaccine status: Due, Education has been provided regarding the importance of this vaccine. Advised may receive this vaccine at local pharmacy or Health Dept. Aware to provide a copy of the vaccination record if obtained from local pharmacy or Health Dept. Verbalized acceptance and understanding.  Covid-19 vaccine status: Information provided on how to obtain vaccines.   Qualifies for Shingles Vaccine? Yes   Zostavax completed No   Shingrix Completed?: Yes  Screening Tests Health Maintenance  Topic Date Due    Pneumonia Vaccine 41+ Years old (1 - PCV) Never done   URINE MICROALBUMIN  Never done   Hepatitis C Screening  Never done   TETANUS/TDAP  Never done   COLONOSCOPY (Pts 45-71yr Insurance coverage will need to be confirmed)  Never done   COVID-19 Vaccine (1) 04/02/2021 (Originally 08/31/1956)   HEMOGLOBIN A1C  09/15/2021   OPHTHALMOLOGY EXAM  09/28/2021   FOOT EXAM  11/11/2021   MAMMOGRAM  06/10/2022   INFLUENZA VACCINE  Completed   DEXA SCAN  Completed   Zoster Vaccines- Shingrix  Completed   HPV VACCINES  Aged Out    Health Maintenance  Health Maintenance Due  Topic Date Due   Pneumonia Vaccine 67 Years old (1 - PCV) Never done   URINE MICROALBUMIN  Never done   Hepatitis C Screening  Never done   TETANUS/TDAP  Never done   COLONOSCOPY (Pts 45-474yrInsurance coverage will need to be confirmed)  Never done    Colorectal cancer screening: Type of screening: Cologuard. Completed Pt will talk to Dr PaPosey [place] March. Repeat every 10 years  Mammogram status: Completed 06/09/2020. Repeat every year  Bone Density status: Completed 06/18/2020. Results reflect: Bone density results: OSTEOPENIA. Repeat every 2 years.  Lung Cancer Screening: (Low Dose CT Chest recommended if Age 67-80ears, 30 pack-year currently smoking OR have quit w/in 15years.) does not qualify.   Lung Cancer Screening Referral: n/a  Additional Screening:  Hepatitis C Screening: does qualify; Completed never  Vision Screening: Recommended annual ophthalmology exams for early detection of glaucoma and other disorders of the eye. Is the patient up to date with their annual eye exam?  Yes  Who is the provider or what is the name of the office in which the patient attends annual eye exams? N/a If pt is not established with a provider, would they like to be referred to a provider to establish care? Yes .   Dental Screening: Recommended annual dental exams for proper oral hygiene  Community Resource Referral /  Chronic Care Management: CRR required this visit?  No   CCM required this visit?  No      Plan:     I have personally reviewed and noted the following in the patients chart:   Medical and social history Use of alcohol, tobacco or illicit drugs  Current medications and supplements including opioid prescriptions.  Functional ability and status Nutritional status Physical activity Advanced directives List of other physicians Hospitalizations, surgeries, and ER visits in previous 12 months Vitals Screenings to include cognitive, depression, and falls Referrals and appointments  In addition, I have reviewed and discussed with patient certain preventive protocols, quality metrics, and best practice recommendations. A written personalized care plan for preventive services as well as general preventive health recommendations were provided to patient.     Lisa Thornton 03/29/2021   Nurse Notes:   Lisa Thornton Thank you for taking time to come for your Medicare Wellness Visit. I appreciate your ongoing commitment to your health goals. Please review the following plan we discussed and let me know if I can assist you in the future.   These are the goals we discussed:  Goals   None     This is a list of the screening recommended for you and due dates:  Health Maintenance  Topic Date Due   Pneumonia Vaccine (1 - PCV) Never done   Urine  Protein Check  Never done   Hepatitis C Screening: USPSTF Recommendation to screen - Ages 46-79 yo.  Never done   Tetanus Vaccine  Never done   Colon Cancer Screening  Never done   COVID-19 Vaccine (1) 04/02/2021*   Hemoglobin A1C  09/15/2021   Eye exam for diabetics  09/28/2021   Complete foot exam   11/11/2021   Mammogram  06/10/2022   Flu Shot  Completed   DEXA scan (bone density measurement)  Completed   Zoster (Shingles) Vaccine  Completed   HPV Vaccine  Aged Out  *Topic was postponed. The date shown is not the original due  date.

## 2021-04-11 ENCOUNTER — Other Ambulatory Visit: Payer: Self-pay | Admitting: Internal Medicine

## 2021-04-11 DIAGNOSIS — I10 Essential (primary) hypertension: Secondary | ICD-10-CM

## 2021-05-09 ENCOUNTER — Other Ambulatory Visit (HOSPITAL_COMMUNITY): Payer: Self-pay | Admitting: *Deleted

## 2021-05-09 ENCOUNTER — Other Ambulatory Visit: Payer: Self-pay | Admitting: Internal Medicine

## 2021-05-09 DIAGNOSIS — I10 Essential (primary) hypertension: Secondary | ICD-10-CM

## 2021-05-09 DIAGNOSIS — C50412 Malignant neoplasm of upper-outer quadrant of left female breast: Secondary | ICD-10-CM

## 2021-05-09 DIAGNOSIS — Z170 Estrogen receptor positive status [ER+]: Secondary | ICD-10-CM

## 2021-05-09 MED ORDER — ONDANSETRON HCL 4 MG PO TABS: 4.0000 mg | ORAL_TABLET | Freq: Three times a day (TID) | ORAL | 2 refills | Status: DC | PRN

## 2021-05-20 ENCOUNTER — Other Ambulatory Visit (HOSPITAL_COMMUNITY): Payer: Self-pay | Admitting: Radiology

## 2021-05-20 ENCOUNTER — Other Ambulatory Visit: Payer: Self-pay | Admitting: *Deleted

## 2021-05-20 DIAGNOSIS — E1165 Type 2 diabetes mellitus with hyperglycemia: Secondary | ICD-10-CM

## 2021-05-20 MED ORDER — ACCU-CHEK GUIDE VI STRP: ORAL_STRIP | 0 refills | Status: DC

## 2021-06-15 ENCOUNTER — Ambulatory Visit (HOSPITAL_COMMUNITY)
Admission: RE | Admit: 2021-06-15 | Discharge: 2021-06-15 | Disposition: A | Discharge status: Home / Self Care | Payer: Medicare Other | Source: Ambulatory Visit | Attending: Hematology | Admitting: Hematology

## 2021-06-15 ENCOUNTER — Other Ambulatory Visit: Payer: Self-pay

## 2021-06-15 ENCOUNTER — Inpatient Hospital Stay (HOSPITAL_COMMUNITY): Payer: Medicare Other | Attending: Hematology

## 2021-06-15 DIAGNOSIS — Z170 Estrogen receptor positive status [ER+]: Secondary | ICD-10-CM | POA: Diagnosis not present

## 2021-06-15 DIAGNOSIS — Z452 Encounter for adjustment and management of vascular access device: Secondary | ICD-10-CM | POA: Diagnosis not present

## 2021-06-15 DIAGNOSIS — Z79811 Long term (current) use of aromatase inhibitors: Secondary | ICD-10-CM | POA: Insufficient documentation

## 2021-06-15 DIAGNOSIS — Z08 Encounter for follow-up examination after completed treatment for malignant neoplasm: Secondary | ICD-10-CM | POA: Insufficient documentation

## 2021-06-15 DIAGNOSIS — Z1231 Encounter for screening mammogram for malignant neoplasm of breast: Secondary | ICD-10-CM | POA: Diagnosis not present

## 2021-06-15 DIAGNOSIS — C50412 Malignant neoplasm of upper-outer quadrant of left female breast: Secondary | ICD-10-CM | POA: Insufficient documentation

## 2021-06-15 LAB — COMPREHENSIVE METABOLIC PANEL
ALT: 27 U/L (ref 0–44)
AST: 53 U/L — ABNORMAL HIGH (ref 15–41)
Albumin: 4 g/dL (ref 3.5–5.0)
Alkaline Phosphatase: 48 U/L (ref 38–126)
Anion gap: 12 (ref 5–15)
BUN: 23 mg/dL (ref 8–23)
CO2: 26 mmol/L (ref 22–32)
Calcium: 10.4 mg/dL — ABNORMAL HIGH (ref 8.9–10.3)
Chloride: 99 mmol/L (ref 98–111)
Creatinine, Ser: 1.07 mg/dL — ABNORMAL HIGH (ref 0.44–1.00)
GFR, Estimated: 57 mL/min — ABNORMAL LOW (ref >60)
Glucose, Bld: 139 mg/dL — ABNORMAL HIGH (ref 70–99)
Potassium: 4.4 mmol/L (ref 3.5–5.1)
Sodium: 137 mmol/L (ref 135–145)
Total Bilirubin: 0.7 mg/dL (ref 0.3–1.2)
Total Protein: 8.6 g/dL — ABNORMAL HIGH (ref 6.5–8.1)

## 2021-06-15 LAB — CBC WITH DIFFERENTIAL/PLATELET
Abs Immature Granulocytes: 0.03 10*3/uL (ref 0.00–0.07)
Basophils Absolute: 0.1 10*3/uL (ref 0.0–0.1)
Basophils Relative: 1 %
Eosinophils Absolute: 0.2 10*3/uL (ref 0.0–0.5)
Eosinophils Relative: 2 %
HCT: 35.5 % — ABNORMAL LOW (ref 36.0–46.0)
Hemoglobin: 12.1 g/dL (ref 12.0–15.0)
Immature Granulocytes: 0 %
Lymphocytes Relative: 37 %
Lymphs Abs: 4 10*3/uL (ref 0.7–4.0)
MCH: 32 pg (ref 26.0–34.0)
MCHC: 34.1 g/dL (ref 30.0–36.0)
MCV: 93.9 fL (ref 80.0–100.0)
Monocytes Absolute: 0.5 10*3/uL (ref 0.1–1.0)
Monocytes Relative: 5 %
Neutro Abs: 6.1 10*3/uL (ref 1.7–7.7)
Neutrophils Relative %: 55 %
Platelets: 211 10*3/uL (ref 150–400)
RBC: 3.78 MIL/uL — ABNORMAL LOW (ref 3.87–5.11)
RDW: 12.4 % (ref 11.5–15.5)
WBC: 10.9 10*3/uL — ABNORMAL HIGH (ref 4.0–10.5)
nRBC: 0 % (ref 0.0–0.2)

## 2021-06-15 LAB — VITAMIN D 25 HYDROXY (VIT D DEFICIENCY, FRACTURES): Vit D, 25-Hydroxy: 42.8 ng/mL (ref 30–100)

## 2021-06-19 ENCOUNTER — Other Ambulatory Visit: Payer: Self-pay | Admitting: Internal Medicine

## 2021-06-19 DIAGNOSIS — I10 Essential (primary) hypertension: Secondary | ICD-10-CM

## 2021-06-22 ENCOUNTER — Inpatient Hospital Stay (HOSPITAL_COMMUNITY): Payer: Medicare Other

## 2021-06-22 ENCOUNTER — Other Ambulatory Visit: Payer: Self-pay

## 2021-06-22 ENCOUNTER — Inpatient Hospital Stay (HOSPITAL_BASED_OUTPATIENT_CLINIC_OR_DEPARTMENT_OTHER): Payer: Medicare Other | Admitting: Hematology

## 2021-06-22 DIAGNOSIS — Z452 Encounter for adjustment and management of vascular access device: Secondary | ICD-10-CM | POA: Diagnosis not present

## 2021-06-22 DIAGNOSIS — Z170 Estrogen receptor positive status [ER+]: Secondary | ICD-10-CM

## 2021-06-22 DIAGNOSIS — Z79811 Long term (current) use of aromatase inhibitors: Secondary | ICD-10-CM | POA: Diagnosis not present

## 2021-06-22 DIAGNOSIS — C50412 Malignant neoplasm of upper-outer quadrant of left female breast: Secondary | ICD-10-CM

## 2021-06-22 MED ORDER — HEPARIN SOD (PORK) LOCK FLUSH 100 UNIT/ML IV SOLN
500.0000 [IU] | Freq: Once | INTRAVENOUS | Status: AC
  Administered 2021-06-22: 500 [IU] via INTRAVENOUS

## 2021-06-22 MED ORDER — SODIUM CHLORIDE 0.9% FLUSH
10.0000 mL | Freq: Once | INTRAVENOUS | Status: AC
  Administered 2021-06-22: 10 mL via INTRAVENOUS

## 2021-06-22 NOTE — Progress Notes (Signed)
Patients port flushed without difficulty.  Good blood return noted with no bruising or swelling noted at site.  Band aid applied.  VSS with discharge and left in satisfactory condition with no s/s of distress noted.   

## 2021-06-22 NOTE — Patient Instructions (Signed)
[place] at [facility] ?Discharge Instructions ? ?You were seen and examined today by Dr. Delton Coombes. He reviewed your most recent labs and mammogram and everything looks okay except your calcium is high. Continue taking Vitamin D twice daily. Please keep follow up appointment as scheduled in 6 months. ? ? ?Thank you for choosing [place] at [facility] to provide your oncology and hematology care.  To afford each patient quality time with our provider, please arrive at least 15 minutes before your scheduled appointment time.  ? ?If you have a lab appointment with the [facility] please come in thru the Main Entrance and check in at the main information desk. ? ?You need to re-schedule your appointment should you arrive 10 or more minutes late.  We strive to give you quality time with our providers, and arriving late affects you and other patients whose appointments are after yours.  Also, if you no show three or more times for appointments you may be dismissed from the clinic at the providers discretion.     ?Again, thank you for choosing [facility].  Our hope is that these requests will decrease the amount of time that you wait before being seen by our physicians.       ?_____________________________________________________________ ? ?Should you have questions after your visit to [facility], please contact our office at [phone] and follow the prompts.  Our office hours are 8:00 a.m. and 4:30 p.m. Monday - Friday.  Please note that voicemails left after 4:00 p.m. may not be returned until the following business day.  We are closed weekends and major holidays.  You do have access to a nurse 24-7, just call the main number to the clinic [phone] and do not press any options, hold on the line and a nurse will answer the phone.   ? ?For prescription refill requests, have your pharmacy contact our office and allow 72 hours.    ? ?Due to Covid, you will need to wear a mask upon entering the hospital. If you do not have a mask, a mask will be given to you at the Main Entrance upon arrival. For doctor visits, patients may have 1 support person age 72 or older with them. For treatment visits, patients can not have anyone with them due to social distancing guidelines and our immunocompromised population.  ? ?  ?

## 2021-06-22 NOTE — Patient Instructions (Signed)
[facility]  Discharge Instructions: ?Thank you for choosing [facility] to provide your oncology and hematology care.  ?If you have a lab appointment with the Cancer Center, please come in thru the Main Entrance and check in at the main information desk. ? ?Wear comfortable clothing and clothing appropriate for easy access to any Portacath or PICC line.  ? ?We strive to give you quality time with your provider. You may need to reschedule your appointment if you arrive late (15 or more minutes).  Arriving late affects you and other patients whose appointments are after yours.  Also, if you miss three or more appointments without notifying the office, you may be dismissed from the clinic at the provider?s discretion.    ?  ?For prescription refill requests, have your pharmacy contact our office and allow 72 hours for refills to be completed.   ? ?Today you received the following chemotherapy and/or immunotherapy agents Port flush    ?  ?To help prevent nausea and vomiting after your treatment, we encourage you to take your nausea medication as directed. ? ?BELOW ARE SYMPTOMS THAT SHOULD BE REPORTED IMMEDIATELY: ?*FEVER GREATER THAN 100.4 F (38 ?C) OR HIGHER ?*CHILLS OR SWEATING ?*NAUSEA AND VOMITING THAT IS NOT CONTROLLED WITH YOUR NAUSEA MEDICATION ?*UNUSUAL SHORTNESS OF BREATH ?*UNUSUAL BRUISING OR BLEEDING ?*URINARY PROBLEMS (pain or burning when urinating, or frequent urination) ?*BOWEL PROBLEMS (unusual diarrhea, constipation, pain near the anus) ?TENDERNESS IN MOUTH AND THROAT WITH OR WITHOUT PRESENCE OF ULCERS (sore throat, sores in mouth, or a toothache) ?UNUSUAL RASH, SWELLING OR PAIN  ?UNUSUAL VAGINAL DISCHARGE OR ITCHING  ? ?Items with * indicate a potential emergency and should be followed up as soon as possible or go to the Emergency Department if any problems should occur. ? ?Please show the CHEMOTHERAPY ALERT CARD or IMMUNOTHERAPY ALERT CARD at check-in to the Emergency  Department and triage nurse. ? ?Should you have questions after your visit or need to cancel or reschedule your appointment, please contact [facility] [phone]  and follow the prompts.  Office hours are 8:00 a.m. to 4:30 p.m. Monday - Friday. Please note that voicemails left after 4:00 p.m. may not be returned until the following business day.  We are closed weekends and major holidays. You have access to a nurse at all times for urgent questions. Please call the main number to the clinic [phone] and follow the prompts. ? ?For any non-urgent questions, you may also contact your provider using MyChart. We now offer e-Visits for anyone 18 and older to request care online for non-urgent symptoms. For details visit [email or web address]. ?  ?Also download the MyChart app! Go to the app store, search "MyChart", open the app, select [place] [facility], and log in with your MyChart username and password. ? ?Due to Covid, a mask is required upon entering the hospital/clinic. If you do not have a mask, one will be given to you upon arrival. For doctor visits, patients may have 1 support person aged 18 or older with them. For treatment visits, patients cannot have anyone with them due to current Covid guidelines and our immunocompromised population.  ?

## 2021-06-22 NOTE — Progress Notes (Signed)
? ?[name] ?[address] ?[place], [place] [address] ? ? ?Patient Care Team: ?Lindell Spar, MD as PCP - General (Internal Medicine) ?[name], MD (Inactive) as PCP - Cardiology (Cardiology) ? ?SUMMARY OF ONCOLOGIC HISTORY: ?Oncology History  ?Breast cancer of upper-outer quadrant of left female breast ([facility])  ?06/22/2017 Initial Diagnosis  ? Breast cancer of upper-outer quadrant of left female breast [facility]) ?  ?06/22/2017 Cancer Staging  ? Staging form: Breast, AJCC 8th Edition ?- Clinical stage from 06/22/2017: Stage IIIB (cT3, cN1, cM0, G3, ER+, PR-, HER2-) - Signed by Derek Jack, MD on 06/22/2017 ? ?  ?06/29/2017 Imaging  ? CT chest showing left axillary adenopathy, 1.2 cm anterior carinal adenopathy, left subpectoral lymph node subcentimeter ? ?06/25/2017 2D echocardiogram with ejection fraction of 55-60% ?  ?07/18/2017 - 12/06/2017 Chemotherapy  ? The patient had dexamethasone (DECADRON) 4 MG tablet, 1 of 1 cycle, Start date: --, End date: -- ?DOXOrubicin (ADRIAMYCIN) chemo injection 110 mg, 60 mg/m2 = 110 mg, Intravenous,  Once, 4 of 4 cycles ?Administration: 110 mg (07/18/2017), 110 mg (08/01/2017), 110 mg (08/20/2017), 110 mg (09/03/2017) ?palonosetron (ALOXI) injection 0.25 mg, 0.25 mg, Intravenous,  Once, 4 of 4 cycles ?Administration: 0.25 mg (07/18/2017), 0.25 mg (08/01/2017), 0.25 mg (08/20/2017), 0.25 mg (09/03/2017) ?pegfilgrastim-cbqv (UDENYCA) injection 6 mg, 6 mg, Subcutaneous, Once, 4 of 4 cycles ?Administration: 6 mg (07/19/2017), 6 mg (08/02/2017), 6 mg (08/22/2017), 6 mg (09/05/2017) ?cyclophosphamide (CYTOXAN) 1,100 mg in sodium chloride 0.9 % 250 mL chemo infusion, 600 mg/m2 = 1,100 mg, Intravenous,  Once, 4 of 4 cycles ?Administration: 1,100 mg (07/18/2017), 1,100 mg (08/01/2017), 1,100 mg (08/20/2017), 1,100 mg (09/03/2017) ?PACLitaxel (TAXOL) 120 mg in sodium chloride 0.9 % 250 mL chemo infusion (</= $RemoveBefor'80mg'QcijfNLdlBXC$ /m2), 64 mg/m2 = 120 mg (80 % of original dose 80 mg/m2), Intravenous,  Once, 12  of 12 cycles ?Dose modification: 64 mg/m2 (80 % of original dose 80 mg/m2, Cycle 5, Reason: Provider Judgment), 60 mg/m2 (75 % of original dose 80 mg/m2, Cycle 15, Reason: Other (see comments)), 40 mg/m2 (50 % of original dose 80 mg/m2, Cycle 16, Reason: Other (see comments)) ?Administration: 120 mg (09/17/2017), 144 mg (09/24/2017), 144 mg (10/03/2017), 144 mg (10/11/2017), 144 mg (10/18/2017), 144 mg (10/25/2017), 144 mg (11/01/2017), 144 mg (11/08/2017), 144 mg (11/15/2017), 144 mg (11/22/2017), 108 mg (11/29/2017), 72 mg (12/06/2017) ?fosaprepitant (EMEND) 150 mg, dexamethasone (DECADRON) 12 mg in sodium chloride 0.9 % 145 mL IVPB, , Intravenous,  Once, 4 of 4 cycles ?Administration:  (07/18/2017),  (08/01/2017),  (08/20/2017),  (09/03/2017) ? ? for chemotherapy treatment.  ? ?  ?02/07/2018 -  Chemotherapy  ? The patient had trastuzumab (HERCEPTIN) 600 mg in sodium chloride 0.9 % 250 mL chemo infusion, 567 mg, Intravenous,  Once, 15 of 15 cycles ?Administration: 600 mg (02/07/2018), 450 mg (02/27/2018), 450 mg (05/01/2018), 450 mg (05/22/2018), 450 mg (06/13/2018), 450 mg (07/03/2018), 450 mg (07/24/2018), 450 mg (08/15/2018), 450 mg (09/06/2018), 450 mg (03/20/2018), 450 mg (09/25/2018), 450 mg (10/16/2018), 450 mg (11/06/2018), 450 mg (11/27/2018), 450 mg (04/10/2018) ?trastuzumab-dkst (OGIVRI) 450 mg in sodium chloride 0.9 % 250 mL chemo infusion, 420 mg (100 % of original dose 6 mg/kg), Intravenous,  Once, 4 of 4 cycles ?Dose modification: 6 mg/kg (original dose 6 mg/kg, Cycle 16, Reason: Other (see comments), Comment: change to biosimilar) ?Administration: 450 mg (12/23/2018), 450 mg (01/13/2019), 450 mg (02/10/2019), 450 mg (03/04/2019) ? ? for chemotherapy treatment.  ? ?  ? Radiation Therapy  ?  ?  ? ? ?  CHIEF COMPLIANT: Follow-up for left breast cancer ? ? ?INTERVAL HISTORY: Lisa Thornton is a 66 y.o. female here today for follow up of her left breast cancer. Her last visit was on 12/23/2020. ? ?Today she reports feeling good. She is taking  anastrozole and tolerating it well. She denies joint pains and hot flashes. She takes 1000 units of vitamin D BID, and she takes 600 mg calcium daily. She is not taking potassium.  ? ?REVIEW OF SYSTEMS:   ?Review of Systems  ?Constitutional:  Negative for appetite change and fatigue.  ?Gastrointestinal:  Positive for nausea.  ?All other systems reviewed and are negative. ? ?I have reviewed the past medical history, past surgical history, social history and family history with the patient and they are unchanged from previous note. ? ? ?ALLERGIES:   ?has No Known Allergies. ? ? ?MEDICATIONS:  ?Current Outpatient Medications  ?Medication Sig Dispense Refill  ? Accu-Chek Softclix Lancets lancets USE 1  TO CHECK GLUCOSE UP TO 4 TIMES DAILY 100 each 0  ? amLODipine (NORVASC) 5 MG tablet Take 1 tablet (5 mg total) by mouth daily. 30 tablet 6  ? anastrozole (ARIMIDEX) 1 MG tablet Take 1 tablet by mouth once daily 90 tablet 2  ? blood glucose meter kit and supplies Dispense based on patient and insurance preference. Use up to four times daily as directed. (FOR ICD-10 E10.9, E11.9). 1 each 0  ? calcium carbonate (OS-CAL) 600 MG TABS tablet Take 600 mg by mouth daily with breakfast.    ? cholecalciferol (VITAMIN D3) 25 MCG (1000 UT) tablet Take 1,000 Units by mouth 2 (two) times daily.     ? Cyanocobalamin (VITAMIN B 12 PO) Take 1 tablet by mouth daily.    ? glucose blood (ACCU-CHEK GUIDE) test strip USE 1 STRIP TO CHECK GLUCOSE THREE TIMES DAILY IN  THE  MORNING,  NOON  AND  IN  THE  EVENING  AT  BEDTIME  AS  DIRECTED 100 each 0  ? hydrochlorothiazide (HYDRODIURIL) 25 MG tablet Take 1 tablet by mouth once daily 30 tablet 2  ? metFORMIN (GLUCOPHAGE) 1000 MG tablet TAKE 1 TABLET BY MOUTH 2 TIMES DAILY WITH A MEAL. 180 tablet 1  ? metoprolol tartrate (LOPRESSOR) 50 MG tablet Take 1 tablet (50 mg total) by mouth 2 (two) times daily. 180 tablet 1  ? Omega-3 Fatty Acids (FISH OIL) 1000 MG CAPS Take 1 capsule by mouth 2 (two) times  daily.    ? ondansetron (ZOFRAN) 4 MG tablet Take 1 tablet (4 mg total) by mouth every 8 (eight) hours as needed for nausea or vomiting. 30 tablet 2  ? rosuvastatin (CRESTOR) 5 MG tablet Take 1 tablet by mouth once daily 90 tablet 0  ? ?No current facility-administered medications for this visit.  ? ? ? ?PHYSICAL EXAMINATION: ?Performance status (ECOG): 1 - Symptomatic but completely ambulatory ? ?There were no vitals filed for this visit. ?Wt Readings from Last 3 Encounters:  ?06/22/21 158 lb 3.2 oz (71.8 kg)  ?03/17/21 156 lb 1.3 oz (70.8 kg)  ?12/23/20 153 lb (69.4 kg)  ? ?Physical Exam ?Vitals reviewed.  ?Constitutional:   ?   Appearance: Normal appearance.  ?Cardiovascular:  ?   Rate and Rhythm: Normal rate and regular rhythm.  ?   Pulses: Normal pulses.  ?   Heart sounds: Normal heart sounds.  ?Pulmonary:  ?   Effort: Pulmonary effort is normal.  ?   Breath sounds: Normal breath sounds.  ?Chest:  ?  Breasts: ?   Right: Normal. No swelling, bleeding, inverted nipple, mass, nipple discharge, skin change or tenderness.  ?   Left: Absent. No swelling, bleeding, mass, skin change (mastectomy site WNL) or tenderness.  ?Abdominal:  ?   Palpations: Abdomen is soft. There is no hepatomegaly, splenomegaly or mass.  ?   Tenderness: There is no abdominal tenderness.  ?Musculoskeletal:  ?   Right lower leg: No edema.  ?Lymphadenopathy:  ?   Upper Body:  ?   Right upper body: No supraclavicular, axillary or pectoral adenopathy.  ?   Left upper body: No supraclavicular, axillary or pectoral adenopathy.  ?Neurological:  ?   General: No focal deficit present.  ?   Mental Status: She is alert and oriented to person, place, and time.  ?Psychiatric:     ?   Mood and Affect: Mood normal.     ?   Behavior: Behavior normal.  ? ? ?Breast Exam Chaperone: Thana Ates   ? ? ?LABORATORY DATA:  ?I have reviewed the data as listed ? ?  Latest Ref Rng & Units 06/15/2021  ?  2:08 PM 03/17/2021  ?  2:53 PM 12/22/2020  ?  1:21 PM  ?CMP  ?Glucose  70 - 99 mg/dL 139   101   317    ?BUN 8 - 23 mg/dL $Remove'23   16   25    'OpXByVr$ ?Creatinine 0.44 - 1.00 mg/dL 1.07   0.96   1.20    ?Sodium 135 - 145 mmol/L 137   138   136    ?Potassium 3.5 - 5.1 mmol/L 4.4   4.2

## 2021-07-18 ENCOUNTER — Ambulatory Visit (INDEPENDENT_AMBULATORY_CARE_PROVIDER_SITE_OTHER): Payer: Medicare Other | Admitting: Internal Medicine

## 2021-07-18 ENCOUNTER — Encounter: Payer: Self-pay | Admitting: Internal Medicine

## 2021-07-18 VITALS — BP 124/64 | HR 81 | Resp 18 | Ht 64.0 in | Wt 157.0 lb

## 2021-07-18 DIAGNOSIS — E1169 Type 2 diabetes mellitus with other specified complication: Secondary | ICD-10-CM

## 2021-07-18 DIAGNOSIS — Z0000 Encounter for general adult medical examination without abnormal findings: Secondary | ICD-10-CM | POA: Diagnosis not present

## 2021-07-18 DIAGNOSIS — I4891 Unspecified atrial fibrillation: Secondary | ICD-10-CM

## 2021-07-18 DIAGNOSIS — Z1211 Encounter for screening for malignant neoplasm of colon: Secondary | ICD-10-CM

## 2021-07-18 DIAGNOSIS — I10 Essential (primary) hypertension: Secondary | ICD-10-CM | POA: Diagnosis not present

## 2021-07-18 DIAGNOSIS — Z1159 Encounter for screening for other viral diseases: Secondary | ICD-10-CM | POA: Diagnosis not present

## 2021-07-18 DIAGNOSIS — E559 Vitamin D deficiency, unspecified: Secondary | ICD-10-CM

## 2021-07-18 DIAGNOSIS — E782 Mixed hyperlipidemia: Secondary | ICD-10-CM | POA: Diagnosis not present

## 2021-07-18 DIAGNOSIS — N3000 Acute cystitis without hematuria: Secondary | ICD-10-CM | POA: Diagnosis not present

## 2021-07-18 MED ORDER — APIXABAN 5 MG PO TABS: 5.0000 mg | ORAL_TABLET | Freq: Two times a day (BID) | ORAL | 5 refills | Status: DC

## 2021-07-18 MED ORDER — SULFAMETHOXAZOLE-TRIMETHOPRIM 800-160 MG PO TABS: 1.0000 | ORAL_TABLET | Freq: Two times a day (BID) | ORAL | 0 refills | Status: DC

## 2021-07-18 NOTE — Assessment & Plan Note (Signed)
BP Readings from Last 1 Encounters:  ?07/18/21 124/64  ? ?Well-controlled with amlodipine, HCTZ and metoprolol ?Counseled for compliance with the medications ?Advised DASH diet and moderate exercise/walking, at least 150 mins/week ?

## 2021-07-18 NOTE — Assessment & Plan Note (Signed)
Lab Results  ?Component Value Date  ? HGBA1C 6.3 (H) 03/17/2021  ? ?With HTN and HLD ?On Metformin ?Advised to follow diabetic diet ?On statin ?F/u CMP and lipid panel ?Diabetic eye exam: Advised to follow up with Ophthalmology for diabetic eye exam ?

## 2021-07-18 NOTE — Patient Instructions (Addendum)
Please start taking Eliquis for atrial fibrillation. ? ?Please continue to take other medications as prescribed. ? ?You are being referred to Cardiology for atrial fibrillation. ? ?Please continue to follow low carb diet and perform moderate exercise/walking at least 150 mins/week. ?

## 2021-07-18 NOTE — Progress Notes (Signed)
? ?Established Patient Office Visit ? ?Subjective:  ?Patient ID: Lisa Thornton, female    DOB: Oct 31, 1955  Age: 66 y.o. MRN: [id number] ? ?CC:  ?Chief Complaint  ?Patient presents with  ? Follow-up  ?  4 month follow up HTN DM and EKG pt thinks she has UTI has burning has been dizzy and temp at night for about 3 days   ? ? ?HPI ?Lisa Thornton is a 66 y.o. female with past medical history of HTN, type 2 DM and breast ca s/p mastectomy and chemotherapy who presents for f/u of her chronic medical conditions. ? ?She complains of intermittent dizziness and chronic fatigue.  EKG in the office today showed atrial fibrillation.  She has history of atrial fibrillation in the past, but was not continued on [facility] due to isolated episode.  She currently denies any chest pain or dyspnea. ? ?Type II DM: Her last HbA1c was 6.3. Her blood glucose has been around 120-140 most of the time. Denies any polyuria or polyphagia. Denies any dysuria or hematuria. ? ?HTN: BP is well-controlled. Takes medications regularly. Patient denies headache, dizziness, chest pain, dyspnea or palpitations. ? ?She also complains of dysuria and urinary frequency for the 3 days.  She also reports low-grade fever, but denies any nausea or vomiting currently. ? ? ?Past Medical History:  ?Diagnosis Date  ? Anemia 12/31/2017  ? Cancer [facility])   ? left breast cancer  ? Chest pain   ? Hypertension   ? ? ?Past Surgical History:  ?Procedure Laterality Date  ? MASTECTOMY MODIFIED RADICAL Left 12/31/2017  ? Procedure: LEFT MODIFIED RADICAL MASTECTOMY;  Surgeon: Aviva Signs, MD;  Location: [facility];  Service: General;  Laterality: Left;  ? PORTACATH PLACEMENT Right 06/27/2017  ? Procedure: INSERTION PORT-A-CATH;  Surgeon: Aviva Signs, MD;  Location: [facility];  Service: General;  Laterality: Right;  ? ? ?Family History  ?Problem Relation Age of Onset  ? Breast cancer Mother   ? Thyroid disease Mother   ? Heart disease Mother   ? Heart attack Father   ? Heart attack Sister   ?  Hypertension Sister   ? Heart attack Brother   ? Cancer Sister   ? Stroke Brother   ? Alzheimer's disease Maternal Aunt   ? ? ?Social History  ? ?Socioeconomic History  ? Marital status: Divorced  ?  Spouse name: Not on file  ? Number of children: 2  ? Years of education: 10  ? Highest education level: 10th grade  ?Occupational History  ? Occupation: House Wife  ?Tobacco Use  ? Smoking status: Never  ? Smokeless tobacco: Never  ?Vaping Use  ? Vaping Use: Never used  ?Substance and Sexual Activity  ? Alcohol use: No  ? Drug use: No  ? Sexual activity: Not Currently  ?Other Topics Concern  ? Not on file  ?Social History Narrative  ? Lives with sister  ? 2 children  ? Dog: Cozie  ?   ? Enjoys: puzzles, sewing, and walk  ?   ? Diet: eats all food groups outside: leafy greens   ? Caffeine: limited, sweet tea  ? Water: 6-8 cups daily   ?   ? No car; does have license, wears seat belt  ? Smoke detector at home  ? [place] area   ? ?Social Determinants of Health  ? ?Financial Resource Strain: Low Risk   ? Difficulty of Paying Living Expenses: Not hard at all  ?Food Insecurity: No Food Insecurity  ?  Worried About Charity fundraiser in the Last Year: Never true  ? Ran Out of Food in the Last Year: Never true  ?Transportation Needs: No Transportation Needs  ? Lack of Transportation (Medical): No  ? Lack of Transportation (Non-Medical): No  ?Physical Activity: Insufficiently Active  ? Days of Exercise per Week: 7 days  ? Minutes of Exercise per Session: 20 min  ?Stress: No Stress Concern Present  ? Feeling of Stress : Not at all  ?Social Connections: Socially Isolated  ? Frequency of Communication with Friends and Family: More than three times a week  ? Frequency of Social Gatherings with Friends and Family: Once a week  ? Attends Religious Services: Never  ? Active Member of Clubs or Organizations: No  ? Attends Archivist Meetings: Never  ? Marital Status: Divorced  ?Intimate Partner Violence: Not At Risk  ?  Fear of Current or Ex-Partner: No  ? Emotionally Abused: No  ? Physically Abused: No  ? Sexually Abused: No  ? ? ?Outpatient Medications Prior to Visit  ?Medication Sig Dispense Refill  ? Accu-Chek Softclix Lancets lancets USE 1  TO CHECK GLUCOSE UP TO 4 TIMES DAILY 100 each 0  ? amLODipine (NORVASC) 5 MG tablet Take 1 tablet (5 mg total) by mouth daily. 30 tablet 6  ? anastrozole (ARIMIDEX) 1 MG tablet Take 1 tablet by mouth once daily 90 tablet 2  ? blood glucose meter kit and supplies Dispense based on patient and insurance preference. Use up to four times daily as directed. (FOR ICD-10 E10.9, E11.9). 1 each 0  ? calcium carbonate (OS-CAL) 600 MG TABS tablet Take 600 mg by mouth daily with breakfast.    ? cholecalciferol (VITAMIN D3) 25 MCG (1000 UT) tablet Take 1,000 Units by mouth 2 (two) times daily.     ? Cyanocobalamin (VITAMIN B 12 PO) Take 1 tablet by mouth daily.    ? glucose blood (ACCU-CHEK GUIDE) test strip USE 1 STRIP TO CHECK GLUCOSE THREE TIMES DAILY IN  THE  MORNING,  NOON  AND  IN  THE  EVENING  AT  BEDTIME  AS  DIRECTED 100 each 0  ? hydrochlorothiazide (HYDRODIURIL) 25 MG tablet Take 1 tablet by mouth once daily 30 tablet 2  ? metFORMIN (GLUCOPHAGE) 1000 MG tablet TAKE 1 TABLET BY MOUTH 2 TIMES DAILY WITH A MEAL. 180 tablet 1  ? metoprolol tartrate (LOPRESSOR) 50 MG tablet Take 1 tablet (50 mg total) by mouth 2 (two) times daily. 180 tablet 1  ? Omega-3 Fatty Acids (FISH OIL) 1000 MG CAPS Take 1 capsule by mouth 2 (two) times daily.    ? ondansetron (ZOFRAN) 4 MG tablet Take 1 tablet (4 mg total) by mouth every 8 (eight) hours as needed for nausea or vomiting. 30 tablet 2  ? rosuvastatin (CRESTOR) 5 MG tablet Take 1 tablet by mouth once daily 90 tablet 0  ? ?No facility-administered medications prior to visit.  ? ? ?No Known Allergies ? ?ROS ?Review of Systems  ?Constitutional:  Positive for fatigue. Negative for chills and fever.  ?HENT:  Negative for congestion, sinus pressure, sinus pain and  sore throat.   ?Eyes:  Negative for pain and discharge.  ?Respiratory:  Negative for cough and shortness of breath.   ?Cardiovascular:  Negative for chest pain and palpitations.  ?Gastrointestinal:  Negative for abdominal pain, diarrhea, nausea and vomiting.  ?Endocrine: Negative for polydipsia and polyuria.  ?Genitourinary:  Positive for dysuria and frequency. Negative for  hematuria.  ?Musculoskeletal:  Negative for neck pain and neck stiffness.  ?Skin:  Negative for rash.  ?Neurological:  Positive for dizziness. Negative for weakness.  ?Psychiatric/Behavioral:  Negative for agitation and behavioral problems.   ? ?  ?Objective:  ?  ?Physical Exam ?Vitals reviewed.  ?Constitutional:   ?   General: She is not in acute distress. ?   Appearance: She is not diaphoretic.  ?HENT:  ?   Head: Normocephalic and atraumatic.  ?   Nose: Nose normal. No congestion.  ?   Mouth/Throat:  ?   Mouth: Mucous membranes are moist.  ?   Pharynx: No posterior oropharyngeal erythema.  ?Eyes:  ?   General: No scleral icterus. ?   Extraocular Movements: Extraocular movements intact.  ?Neck:  ?   Vascular: No carotid bruit.  ?Cardiovascular:  ?   Rate and Rhythm: Normal rate. Rhythm irregular.  ?   Pulses: Normal pulses.  ?   Heart sounds: Normal heart sounds. No murmur heard. ?Pulmonary:  ?   Breath sounds: Normal breath sounds. No wheezing or rales.  ?Musculoskeletal:  ?   Cervical back: Neck supple. No tenderness.  ?   Right lower leg: No edema.  ?   Left lower leg: No edema.  ?Feet:  ?   Right foot:  ?   Toenail Condition: Right toenails are abnormally thick. Fungal disease present. ?   Left foot:  ?   Toenail Condition: Left toenails are abnormally thick. Fungal disease present. ?Skin: ?   General: Skin is warm.  ?   Findings: No rash.  ?Neurological:  ?   General: No focal deficit present.  ?   Mental Status: She is alert and oriented to person, place, and time.  ?   Sensory: No sensory deficit.  ?   Motor: No weakness.  ?Psychiatric:      ?   Mood and Affect: Mood normal.     ?   Behavior: Behavior normal.  ? ? ?BP 124/64 (BP Location: Right Arm, Patient Position: Sitting, Cuff Size: Normal)   Pulse 81   Resp 18   Ht _0  (1.626 m)

## 2021-07-18 NOTE — Assessment & Plan Note (Addendum)
Had single episode in 2019, had been in sinus rhythm since then ?Has seen cardiology in the past -asymptomatic, and since she has isolated episode, it was decided to keep her on only rate controlling agent and not AC. ?EKG - A. Fib. HR 97 ? ?CHADS-VASc - 4. ? ?Started Eliquis for [facility] ?Continue metoprolol for rate control ?Referred to cardiology ?

## 2021-07-19 ENCOUNTER — Encounter (INDEPENDENT_AMBULATORY_CARE_PROVIDER_SITE_OTHER): Payer: Self-pay | Admitting: *Deleted

## 2021-07-20 DIAGNOSIS — E1169 Type 2 diabetes mellitus with other specified complication: Secondary | ICD-10-CM | POA: Diagnosis not present

## 2021-07-20 DIAGNOSIS — N3000 Acute cystitis without hematuria: Secondary | ICD-10-CM | POA: Diagnosis not present

## 2021-07-20 LAB — POCT URINALYSIS DIP (CLINITEK)
Bilirubin, UA: NEGATIVE
Glucose, UA: NEGATIVE mg/dL
Ketones, POC UA: NEGATIVE mg/dL
Nitrite, UA: POSITIVE — AB
POC PROTEIN,UA: NEGATIVE
Spec Grav, UA: >=1.03 — AB (ref 1.010–1.025)
Urobilinogen, UA: 0.2 E.U./dL
pH, UA: 5 (ref 5.0–8.0)

## 2021-07-22 ENCOUNTER — Encounter: Payer: Self-pay | Admitting: Internal Medicine

## 2021-07-22 LAB — MICROALBUMIN / CREATININE URINE RATIO
Creatinine, Urine: 119.8 mg/dL
Microalb/Creat Ratio: 13 mg/g creat (ref 0–29)
Microalbumin, Urine: 15.8 ug/mL

## 2021-07-24 LAB — URINE CULTURE

## 2021-08-03 DIAGNOSIS — I4891 Unspecified atrial fibrillation: Secondary | ICD-10-CM | POA: Diagnosis not present

## 2021-08-03 DIAGNOSIS — E1169 Type 2 diabetes mellitus with other specified complication: Secondary | ICD-10-CM | POA: Diagnosis not present

## 2021-08-04 ENCOUNTER — Ambulatory Visit (INDEPENDENT_AMBULATORY_CARE_PROVIDER_SITE_OTHER): Payer: Medicare Other | Admitting: Internal Medicine

## 2021-08-04 ENCOUNTER — Encounter: Payer: Self-pay | Admitting: Internal Medicine

## 2021-08-04 DIAGNOSIS — E1169 Type 2 diabetes mellitus with other specified complication: Secondary | ICD-10-CM

## 2021-08-04 DIAGNOSIS — I4891 Unspecified atrial fibrillation: Secondary | ICD-10-CM

## 2021-08-04 DIAGNOSIS — E1165 Type 2 diabetes mellitus with hyperglycemia: Secondary | ICD-10-CM

## 2021-08-04 LAB — HEMOGLOBIN A1C
Est. average glucose Bld gHb Est-mCnc: 177 mg/dL
Hgb A1c MFr Bld: 7.8 % — ABNORMAL HIGH (ref 4.8–5.6)

## 2021-08-04 LAB — CBC
Hematocrit: 34.7 % (ref 34.0–46.6)
Hemoglobin: 11.6 g/dL (ref 11.1–15.9)
MCH: 31.4 pg (ref 26.6–33.0)
MCHC: 33.4 g/dL (ref 31.5–35.7)
MCV: 94 fL (ref 79–97)
Platelets: 211 10*3/uL (ref 150–450)
RBC: 3.7 x10E6/uL — ABNORMAL LOW (ref 3.77–5.28)
RDW: 13.1 % (ref 11.7–15.4)
WBC: 10.4 10*3/uL (ref 3.4–10.8)

## 2021-08-04 LAB — TSH+FREE T4
Free T4: 1.3 ng/dL (ref 0.82–1.77)
TSH: 1.97 u[IU]/mL (ref 0.450–4.500)

## 2021-08-04 MED ORDER — METFORMIN HCL 1000 MG PO TABS: 1000.0000 mg | ORAL_TABLET | Freq: Every day | ORAL | 1 refills | Status: DC

## 2021-08-04 MED ORDER — RYBELSUS 3 MG PO TABS: 3.0000 mg | ORAL_TABLET | Freq: Every day | ORAL | 3 refills | Status: DC

## 2021-08-04 NOTE — Assessment & Plan Note (Signed)
Lab Results  ?Component Value Date  ? HGBA1C 7.8 (H) 08/03/2021  ? ?With HTN and HLD ?On Metformin 1000 mg BID, but she takes it QD mostly and forgets second dose ?Added Rybelsus 3 mg QD for now ?Advised to follow diabetic diet ?On statin ?F/u CMP and lipid panel ?Diabetic eye exam: Advised to follow up with Ophthalmology for diabetic eye exam ?

## 2021-08-04 NOTE — Patient Instructions (Signed)
Please start taking metformin 1000 mg once daily. ? ?Please start taking Rybelsus 3 mg daily. ? ?Please follow low-carb diet and ambulate as tolerated. ?

## 2021-08-04 NOTE — Assessment & Plan Note (Signed)
Had single episode in 2019, had been in sinus rhythm since then ?Has seen cardiology in the past -asymptomatic, and since she has isolated episode, it was decided to keep her on only rate controlling agent and not AC. ?EKG - A. Fib. HR 97 ? ?CHADS-VASc - 4. ? ?Recently started Eliquis for [facility] ?Continue metoprolol for rate control ?Referred to cardiology ?

## 2021-08-04 NOTE — Progress Notes (Signed)
?  ? ?Virtual Visit via Telephone Note  ? ?This visit type was conducted due to national recommendations for restrictions regarding the COVID-19 Pandemic (e.g. social distancing) in an effort to limit this patient's exposure and mitigate transmission in our community.  Due to her co-morbid illnesses, this patient is at least at moderate risk for complications without adequate follow up.  This format is felt to be most appropriate for this patient at this time.  The patient did not have access to video technology/had technical difficulties with video requiring transitioning to audio format only (telephone).  All issues noted in this document were discussed and addressed.  No physical exam could be performed with this format. ? ?Evaluation Performed:  Follow-up visit ? ?Date:  08/04/2021  ? ?ID:  Lisa Thornton, DOB 10-31-1955, MRN [id number] ? ?Patient Location: Home ?Provider Location: Office/Clinic ? ?Participants: Patient ?Location of Patient: Home ?Location of Provider: Telehealth ?Consent was obtain for visit to be over via telehealth. ?I verified that I am speaking with the correct person using two identifiers. ? ?PCP:  Lindell Spar, MD  ? ?Chief Complaint: Type II DM ? ?History of Present Illness:   ? ?Lisa Thornton is a 66 y.o. female who has a televisit for follow up of recent blood tests. Her HbA1C was elevated compared to prior, at 7.8 from 6.3. She states that she takes metformin regularly, but her  compliance is questionable.  She has chronic fatigue, but denies any polyuria or polyphagia.  Her thyroid function test was WNL. ? ?The patient does not have symptoms concerning for COVID-19 infection (fever, chills, cough, or new shortness of breath).  ? ?Past Medical, Surgical, Social History, Allergies, and Medications have been Reviewed. ? ?Past Medical History:  ?Diagnosis Date  ? Anemia 12/31/2017  ? Cancer [facility])   ? left breast cancer  ? Chest pain   ? Hypertension   ? ?Past Surgical History:  ?Procedure  Laterality Date  ? MASTECTOMY MODIFIED RADICAL Left 12/31/2017  ? Procedure: LEFT MODIFIED RADICAL MASTECTOMY;  Surgeon: Aviva Signs, MD;  Location: [facility];  Service: General;  Laterality: Left;  ? PORTACATH PLACEMENT Right 06/27/2017  ? Procedure: INSERTION PORT-A-CATH;  Surgeon: Aviva Signs, MD;  Location: [facility];  Service: General;  Laterality: Right;  ?  ? ?Current Meds  ?Medication Sig  ? Accu-Chek Softclix Lancets lancets USE 1  TO CHECK GLUCOSE UP TO 4 TIMES DAILY  ? amLODipine (NORVASC) 5 MG tablet Take 1 tablet (5 mg total) by mouth daily.  ? anastrozole (ARIMIDEX) 1 MG tablet Take 1 tablet by mouth once daily  ? apixaban (ELIQUIS) 5 MG TABS tablet Take 1 tablet (5 mg total) by mouth 2 (two) times daily.  ? blood glucose meter kit and supplies Dispense based on patient and insurance preference. Use up to four times daily as directed. (FOR ICD-10 E10.9, E11.9).  ? calcium carbonate (OS-CAL) 600 MG TABS tablet Take 600 mg by mouth daily with breakfast.  ? cholecalciferol (VITAMIN D3) 25 MCG (1000 UT) tablet Take 1,000 Units by mouth 2 (two) times daily.   ? Cyanocobalamin (VITAMIN B 12 PO) Take 1 tablet by mouth daily.  ? glucose blood (ACCU-CHEK GUIDE) test strip USE 1 STRIP TO CHECK GLUCOSE THREE TIMES DAILY IN  THE  MORNING,  NOON  AND  IN  THE  EVENING  AT  BEDTIME  AS  DIRECTED  ? hydrochlorothiazide (HYDRODIURIL) 25 MG tablet Take 1 tablet by mouth once daily  ?  metoprolol tartrate (LOPRESSOR) 50 MG tablet Take 1 tablet (50 mg total) by mouth 2 (two) times daily.  ? Omega-3 Fatty Acids (FISH OIL) 1000 MG CAPS Take 1 capsule by mouth 2 (two) times daily.  ? ondansetron (ZOFRAN) 4 MG tablet Take 1 tablet (4 mg total) by mouth every 8 (eight) hours as needed for nausea or vomiting.  ? rosuvastatin (CRESTOR) 5 MG tablet Take 1 tablet by mouth once daily  ? Semaglutide (RYBELSUS) 3 MG TABS Take 3 mg by mouth daily.  ? sulfamethoxazole-trimethoprim (BACTRIM DS) 800-160 MG tablet Take 1 tablet by mouth 2  (two) times daily.  ? [DISCONTINUED] metFORMIN (GLUCOPHAGE) 1000 MG tablet TAKE 1 TABLET BY MOUTH 2 TIMES DAILY WITH A MEAL.  ?  ? ?Allergies:   Patient has no known allergies.  ? ?ROS:   ?Please see the history of present illness.    ? ?All other systems reviewed and are negative. ? ? ?Labs/Other Tests and Data Reviewed:   ? ?Recent Labs: ?06/15/2021: ALT 27; BUN 23; Creatinine, Ser 1.07; Potassium 4.4; Sodium 137 ?08/03/2021: Hemoglobin 11.6; Platelets 211; TSH 1.970  ? ?Recent Lipid Panel ?Lab Results  ?Component Value Date/Time  ? CHOL 117 11/11/2020 01:59 PM  ? TRIG 133 11/11/2020 01:59 PM  ? HDL 39 (L) 11/11/2020 01:59 PM  ? CHOLHDL 3.0 11/11/2020 01:59 PM  ? LDLCALC 55 11/11/2020 01:59 PM  ? ? ?Wt Readings from Last 3 Encounters:  ?07/18/21 157 lb (71.2 kg)  ?06/22/21 158 lb 3.2 oz (71.8 kg)  ?03/17/21 156 lb 1.3 oz (70.8 kg)  ?  ? ?ASSESSMENT & PLAN:   ? ?Type 2 diabetes mellitus with other specified complication ([place]) ?Lab Results  ?Component Value Date  ? HGBA1C 7.8 (H) 08/03/2021  ? ?With HTN and HLD ?On Metformin 1000 mg BID, but she takes it QD mostly and forgets second dose ?Added Rybelsus 3 mg QD for now ?Advised to follow diabetic diet ?On statin ?F/u CMP and lipid panel ?Diabetic eye exam: Advised to follow up with Ophthalmology for diabetic eye exam ? ?Atrial fibrillation ([place]) ?Had single episode in 2019, had been in sinus rhythm since then ?Has seen cardiology in the past -asymptomatic, and since she has isolated episode, it was decided to keep her on only rate controlling agent and not AC. ?EKG - A. Fib. HR 97 ? ?CHADS-VASc - 4. ? ?Recently started Eliquis for [facility] ?Continue metoprolol for rate control ?Referred to cardiology ? ? ?Time:   ?Today, I have spent 14 minutes reviewing the chart, including problem list, medications, and with the patient with telehealth technology discussing the above problems. ? ? ?Medication Adjustments/Labs and Tests Ordered: ?Current medicines are reviewed at length  with the patient today.  Concerns regarding medicines are outlined above.  ? ?Tests Ordered: ?No orders of the defined types were placed in this encounter. ? ? ?Medication Changes: ?Meds ordered this encounter  ?Medications  ? metFORMIN (GLUCOPHAGE) 1000 MG tablet  ?  Sig: Take 1 tablet (1,000 mg total) by mouth daily with breakfast.  ?  Dispense:  90 tablet  ?  Refill:  1  ? Semaglutide (RYBELSUS) 3 MG TABS  ?  Sig: Take 3 mg by mouth daily.  ?  Dispense:  30 tablet  ?  Refill:  3  ? ? ? ?Note: This dictation was prepared with Dragon dictation along with smaller phrase technology. Similar sounding words can be transcribed inadequately or may not be corrected upon review. Any transcriptional errors that  result from this process are unintentional.  ?  ? ? ?Disposition:  Follow up  ?Signed, ?Lindell Spar, MD  ?08/04/2021 4:52 PM    ? ?[facility] ?[facility] ?

## 2021-08-17 ENCOUNTER — Other Ambulatory Visit: Payer: Self-pay | Admitting: Internal Medicine

## 2021-08-17 DIAGNOSIS — I10 Essential (primary) hypertension: Secondary | ICD-10-CM

## 2021-08-18 ENCOUNTER — Other Ambulatory Visit (INDEPENDENT_AMBULATORY_CARE_PROVIDER_SITE_OTHER): Payer: Self-pay

## 2021-08-18 DIAGNOSIS — Z1211 Encounter for screening for malignant neoplasm of colon: Secondary | ICD-10-CM

## 2021-08-30 ENCOUNTER — Telehealth (INDEPENDENT_AMBULATORY_CARE_PROVIDER_SITE_OTHER): Payer: Self-pay

## 2021-08-30 ENCOUNTER — Encounter (INDEPENDENT_AMBULATORY_CARE_PROVIDER_SITE_OTHER): Payer: Self-pay

## 2021-08-30 MED ORDER — PEG 3350-KCL-NA BICARB-NACL 420 G PO SOLR: 4000.0000 mL | ORAL | 0 refills | Status: DC

## 2021-08-30 NOTE — Telephone Encounter (Signed)
Audryana Hockenberry Ann Maurio Baize, CMA  ?

## 2021-08-30 NOTE — Telephone Encounter (Signed)
Referring MD/PCP: Posey Pronto  Procedure: Tcs  Reason/Indication:  Screening   Has patient had this procedure before?  no  If so, when, by whom and where?    Is there a family history of colon cancer?  no  Who?  What age when diagnosed?    Is patient diabetic? If yes, Type 1 or Type 2   yes, type 2      Does patient have prosthetic heart valve or mechanical valve?  No   Do you have a pacemaker/defibrillator?  no  Has patient ever had endocarditis/atrial fibrillation? no  Does patient use oxygen? no  Has patient had joint replacement within last 12 months?  no  Is patient constipated or do they take laxatives? no  Does patient have a history of alcohol/drug use?  No   Have you had a stroke/heart attack last 6 mths? No   Do you take medicine for weight loss?  no  For female patients,: have you had a hysterectomy no                       are you post menopausal yes                      do you still have your menstrual cycle no  Is patient on blood thinner such as Coumadin, Plavix and/or Aspirin? yes  Medications: Eliquis 5 mg bid, metoprolol 50 mg bid, amlodipine 5 mg bid, anastrozole 1 mg daily, hctz 25 mg daily, vit d3 1000 mg bid, fish oil daily, calcium daily, rosuvastatain 5 mg daily   Allergies: nkda  Medication Adjustment per Dr Vivia Budge 2 DAYS PRIOR TO PROCEDURE  Procedure date & time: 09/27/21 AT 945

## 2021-08-30 NOTE — Telephone Encounter (Signed)
Ok to schedule.  Thanks,  Demetric Dunnaway Castaneda Mayorga, MD Gastroenterology and Hepatology [facility]  

## 2021-09-05 ENCOUNTER — Telehealth (INDEPENDENT_AMBULATORY_CARE_PROVIDER_SITE_OTHER): Payer: Self-pay

## 2021-09-05 NOTE — Telephone Encounter (Signed)
Per Dr Ihor Dow it is okay for Lisa Thornton dob [birth year] to hold Eliquis 48 hours prior to procedure, patient aware

## 2021-09-05 NOTE — Telephone Encounter (Signed)
Thanks

## 2021-09-07 ENCOUNTER — Other Ambulatory Visit: Payer: Self-pay | Admitting: Nurse Practitioner

## 2021-09-15 ENCOUNTER — Other Ambulatory Visit: Payer: Self-pay | Admitting: Internal Medicine

## 2021-09-15 DIAGNOSIS — I10 Essential (primary) hypertension: Secondary | ICD-10-CM

## 2021-09-21 ENCOUNTER — Other Ambulatory Visit: Payer: Self-pay | Admitting: Internal Medicine

## 2021-09-21 DIAGNOSIS — E1165 Type 2 diabetes mellitus with hyperglycemia: Secondary | ICD-10-CM

## 2021-09-21 NOTE — Patient Instructions (Signed)
DEEPA BARTHEL  09/21/2021     '@PREFPERIOPPHARMACY'$ @   Your procedure is scheduled on  09/27/2021.   Report to [facility] at  [place]  A.M.   Call this number if you have problems the morning of surgery:  [phone]   Remember:  Follow the diet and prep instructions given to you by the office.     Your last dose of eliquis should be on 09/24/2021.    DO NOT take any medications for diabetes the morning of your procedure.     Take these medicines the morning of surgery with A SIP OF WATER       norvasc, arimidex, metoprolol, zofran (if needed).     Do not wear jewelry, make-up or nail polish.  Do not wear lotions, powders, or perfumes, or deodorant.  Do not shave 48 hours prior to surgery.  Men may shave face and neck.  Do not bring valuables to the hospital.  [facility] is not responsible for any belongings or valuables.  Contacts, dentures or bridgework may not be worn into surgery.  Leave your suitcase in the car.  After surgery it may be brought to your room.  For patients admitted to the hospital, discharge time will be determined by your treatment team.  Patients discharged the day of surgery will not be allowed to drive home and must have someone with them for 24 hours.    Special instructions:   DO NOT smoke tobacco or vape fore 24 hours before your procedure.  Please read over the following fact sheets that you were given. Anesthesia Post-op Instructions and Care and Recovery After Surgery      Colonoscopy, Adult, Care After The following information offers guidance on how to care for yourself after your procedure. Your health care provider may also give you more specific instructions. If you have problems or questions, contact your health care provider. What can I expect after the procedure? After the procedure, it is common to have: A small amount of blood in your stool for 24 hours after the procedure. Some gas. Mild cramping or bloating of your  abdomen. Follow these instructions at home: Eating and drinking  Drink enough fluid to keep your urine pale yellow. Follow instructions from your health care provider about eating or drinking restrictions. Resume your normal diet as told by your health care provider. Avoid heavy or fried foods that are hard to digest. Activity Rest as told by your health care provider. Avoid sitting for a long time without moving. Get up to take short walks every 1-2 hours. This is important to improve blood flow and breathing. Ask for help if you feel weak or unsteady. Return to your normal activities as told by your health care provider. Ask your health care provider what activities are safe for you. Managing cramping and bloating  Try walking around when you have cramps or feel bloated. If directed, apply heat to your abdomen as told by your health care provider. Use the heat source that your health care provider recommends, such as a moist heat pack or a heating pad. Place a towel between your skin and the heat source. Leave the heat on for 20-30 minutes. Remove the heat if your skin turns bright red. This is especially important if you are unable to feel pain, heat, or cold. You have a greater risk of getting burned. General instructions If you were given a sedative during the procedure, it can affect  you for several hours. Do not drive or operate machinery until your health care provider says that it is safe. For the first 24 hours after the procedure: Do not sign important documents. Do not drink alcohol. Do your regular daily activities at a slower pace than normal. Eat soft foods that are easy to digest. Take over-the-counter and prescription medicines only as told by your health care provider. Keep all follow-up visits. This is important. Contact a health care provider if: You have blood in your stool 2-3 days after the procedure. Get help right away if: You have more than a small spotting of  blood in your stool. You have large blood clots in your stool. You have swelling of your abdomen. You have nausea or vomiting. You have a fever. You have increasing pain in your abdomen that is not relieved with medicine. These symptoms may be an emergency. Get help right away. Call 911. Do not wait to see if the symptoms will go away. Do not drive yourself to the hospital. Summary After the procedure, it is common to have a small amount of blood in your stool. You may also have mild cramping and bloating of your abdomen. If you were given a sedative during the procedure, it can affect you for several hours. Do not drive or operate machinery until your health care provider says that it is safe. Get help right away if you have a lot of blood in your stool, nausea or vomiting, a fever, or increased pain in your abdomen. This information is not intended to replace advice given to you by your health care provider. Make sure you discuss any questions you have with your health care provider. Document Revised: 11/10/2020 Document Reviewed: 11/10/2020 Elsevier Patient Education  [place] After This sheet gives you information about how to care for yourself after your procedure. Your health care provider may also give you more specific instructions. If you have problems or questions, contact your health care provider. What can I expect after the procedure? After the procedure, it is common to have: Tiredness. Forgetfulness about what happened after the procedure. Impaired judgment for important decisions. Nausea or vomiting. Some difficulty with balance. Follow these instructions at home: For the time period you were told by your health care provider:     Rest as needed. Do not participate in activities where you could fall or become injured. Do not drive or use machinery. Do not drink alcohol. Do not take sleeping pills or medicines that cause  drowsiness. Do not make important decisions or sign legal documents. Do not take care of children on your own. Eating and drinking Follow the diet that is recommended by your health care provider. Drink enough fluid to keep your urine pale yellow. If you vomit: Drink water, juice, or soup when you can drink without vomiting. Make sure you have little or no nausea before eating solid foods. General instructions Have a responsible adult stay with you for the time you are told. It is important to have someone help care for you until you are awake and alert. Take over-the-counter and prescription medicines only as told by your health care provider. If you have sleep apnea, surgery and certain medicines can increase your risk for breathing problems. Follow instructions from your health care provider about wearing your sleep device: Anytime you are sleeping, including during daytime naps. While taking prescription pain medicines, sleeping medicines, or medicines that make you drowsy. Avoid smoking. Keep  all follow-up visits as told by your health care provider. This is important. Contact a health care provider if: You keep feeling nauseous or you keep vomiting. You feel light-headed. You are still sleepy or having trouble with balance after 24 hours. You develop a rash. You have a fever. You have redness or swelling around the IV site. Get help right away if: You have trouble breathing. You have new-onset confusion at home. Summary For several hours after your procedure, you may feel tired. You may also be forgetful and have poor judgment. Have a responsible adult stay with you for the time you are told. It is important to have someone help care for you until you are awake and alert. Rest as told. Do not drive or operate machinery. Do not drink alcohol or take sleeping pills. Get help right away if you have trouble breathing, or if you suddenly become confused. This information is not  intended to replace advice given to you by your health care provider. Make sure you discuss any questions you have with your health care provider. Document Revised: 02/22/2021 Document Reviewed: 02/20/2019 Elsevier Patient Education  [place].

## 2021-09-22 ENCOUNTER — Inpatient Hospital Stay (HOSPITAL_COMMUNITY): Payer: Medicare Other | Attending: Hematology

## 2021-09-22 ENCOUNTER — Encounter (HOSPITAL_COMMUNITY): Payer: Self-pay

## 2021-09-22 VITALS — BP 128/88 | HR 74 | Temp 97.9°F | Resp 18

## 2021-09-22 DIAGNOSIS — Z452 Encounter for adjustment and management of vascular access device: Secondary | ICD-10-CM | POA: Insufficient documentation

## 2021-09-22 DIAGNOSIS — Z170 Estrogen receptor positive status [ER+]: Secondary | ICD-10-CM | POA: Diagnosis not present

## 2021-09-22 DIAGNOSIS — C50412 Malignant neoplasm of upper-outer quadrant of left female breast: Secondary | ICD-10-CM | POA: Diagnosis not present

## 2021-09-22 DIAGNOSIS — Z79811 Long term (current) use of aromatase inhibitors: Secondary | ICD-10-CM | POA: Diagnosis not present

## 2021-09-22 DIAGNOSIS — Z95828 Presence of other vascular implants and grafts: Secondary | ICD-10-CM

## 2021-09-22 MED ORDER — HEPARIN SOD (PORK) LOCK FLUSH 100 UNIT/ML IV SOLN
500.0000 [IU] | Freq: Once | INTRAVENOUS | Status: AC
  Administered 2021-09-22: 500 [IU] via INTRAVENOUS

## 2021-09-22 MED ORDER — SODIUM CHLORIDE 0.9% FLUSH
10.0000 mL | Freq: Once | INTRAVENOUS | Status: AC
  Administered 2021-09-22: 10 mL via INTRAVENOUS

## 2021-09-22 NOTE — Patient Instructions (Signed)
[facility]  Discharge Instructions: Thank you for choosing [facility] to provide your oncology and hematology care.  If you have a lab appointment with the Cancer Center, please come in thru the Main Entrance and check in at the main information desk.  Wear comfortable clothing and clothing appropriate for easy access to any Portacath or PICC line.   We strive to give you quality time with your provider. You may need to reschedule your appointment if you arrive late (15 or more minutes).  Arriving late affects you and other patients whose appointments are after yours.  Also, if you miss three or more appointments without notifying the office, you may be dismissed from the clinic at the provider's discretion.      For prescription refill requests, have your pharmacy contact our office and allow 72 hours for refills to be completed.         To help prevent nausea and vomiting after your treatment, we encourage you to take your nausea medication as directed.  BELOW ARE SYMPTOMS THAT SHOULD BE REPORTED IMMEDIATELY: *FEVER GREATER THAN 100.4 F (38 C) OR HIGHER *CHILLS OR SWEATING *NAUSEA AND VOMITING THAT IS NOT CONTROLLED WITH YOUR NAUSEA MEDICATION *UNUSUAL SHORTNESS OF BREATH *UNUSUAL BRUISING OR BLEEDING *URINARY PROBLEMS (pain or burning when urinating, or frequent urination) *BOWEL PROBLEMS (unusual diarrhea, constipation, pain near the anus) TENDERNESS IN MOUTH AND THROAT WITH OR WITHOUT PRESENCE OF ULCERS (sore throat, sores in mouth, or a toothache) UNUSUAL RASH, SWELLING OR PAIN  UNUSUAL VAGINAL DISCHARGE OR ITCHING   Items with * indicate a potential emergency and should be followed up as soon as possible or go to the Emergency Department if any problems should occur.  Please show the CHEMOTHERAPY ALERT CARD or IMMUNOTHERAPY ALERT CARD at check-in to the Emergency Department and triage nurse.  Should you have questions after your visit or need to  cancel or reschedule your appointment, please contact [facility] [phone]  and follow the prompts.  Office hours are 8:00 a.m. to 4:30 p.m. Monday - Friday. Please note that voicemails left after 4:00 p.m. may not be returned until the following business day.  We are closed weekends and major holidays. You have access to a nurse at all times for urgent questions. Please call the main number to the clinic [phone] and follow the prompts.  For any non-urgent questions, you may also contact your provider using MyChart. We now offer e-Visits for anyone 18 and older to request care online for non-urgent symptoms. For details visit [email or web address].   Also download the MyChart app! Go to the app store, search "MyChart", open the app, select [facility], and log in with your MyChart username and password.  Masks are optional in the cancer centers. If you would like for your care team to wear a mask while they are taking care of you, please let them know. For doctor visits, patients may have with them one support person who is at least 68 years old. At this time, visitors are not allowed in the infusion area.  

## 2021-09-22 NOTE — Progress Notes (Signed)
Patients port flushed without difficulty.  Good blood return noted with no bruising or swelling noted at site.  Band aid applied.  VSS with discharge and left in satisfactory condition with no s/s of distress noted.   

## 2021-09-23 ENCOUNTER — Encounter (HOSPITAL_COMMUNITY): Payer: Self-pay

## 2021-09-23 ENCOUNTER — Encounter (HOSPITAL_COMMUNITY)
Admission: RE | Admit: 2021-09-23 | Discharge: 2021-09-23 | Disposition: A | Discharge status: Home / Self Care | Payer: Medicare Other | Source: Ambulatory Visit | Attending: Gastroenterology | Admitting: Gastroenterology

## 2021-09-23 ENCOUNTER — Other Ambulatory Visit (HOSPITAL_COMMUNITY)
Admission: RE | Admit: 2021-09-23 | Discharge: 2021-09-23 | Disposition: A | Discharge status: Home / Self Care | Payer: Medicare Other | Source: Home / Self Care | Attending: Gastroenterology | Admitting: Gastroenterology

## 2021-09-23 ENCOUNTER — Other Ambulatory Visit: Payer: Self-pay

## 2021-09-23 DIAGNOSIS — Z1211 Encounter for screening for malignant neoplasm of colon: Secondary | ICD-10-CM

## 2021-09-23 DIAGNOSIS — Z01812 Encounter for preprocedural laboratory examination: Secondary | ICD-10-CM | POA: Insufficient documentation

## 2021-09-23 DIAGNOSIS — D649 Anemia, unspecified: Secondary | ICD-10-CM | POA: Diagnosis not present

## 2021-09-23 HISTORY — DX: Type 2 diabetes mellitus without complications: E11.9

## 2021-09-23 LAB — CBC WITH DIFFERENTIAL/PLATELET
Abs Immature Granulocytes: 0.04 10*3/uL (ref 0.00–0.07)
Abs Immature Granulocytes: 0.08 10*3/uL — ABNORMAL HIGH (ref 0.00–0.07)
Basophils Absolute: 0 10*3/uL (ref 0.0–0.1)
Basophils Absolute: 0.1 10*3/uL (ref 0.0–0.1)
Basophils Relative: 0 %
Basophils Relative: 1 %
Eosinophils Absolute: 0.2 10*3/uL (ref 0.0–0.5)
Eosinophils Absolute: 0.2 10*3/uL (ref 0.0–0.5)
Eosinophils Relative: 2 %
Eosinophils Relative: 2 %
HCT: 32.5 % — ABNORMAL LOW (ref 36.0–46.0)
HCT: 31.8 % — ABNORMAL LOW (ref 36.0–46.0)
Hemoglobin: 11.1 g/dL — ABNORMAL LOW (ref 12.0–15.0)
Hemoglobin: 11.2 g/dL — ABNORMAL LOW (ref 12.0–15.0)
Immature Granulocytes: 0 %
Immature Granulocytes: 1 %
Lymphocytes Relative: 27 %
Lymphocytes Relative: 29 %
Lymphs Abs: 2.5 10*3/uL (ref 0.7–4.0)
Lymphs Abs: 2.8 10*3/uL (ref 0.7–4.0)
MCH: 32 pg (ref 26.0–34.0)
MCH: 32.7 pg (ref 26.0–34.0)
MCHC: 34.2 g/dL (ref 30.0–36.0)
MCHC: 35.2 g/dL (ref 30.0–36.0)
MCV: 93.7 fL (ref 80.0–100.0)
MCV: 93 fL (ref 80.0–100.0)
Monocytes Absolute: 0.5 10*3/uL (ref 0.1–1.0)
Monocytes Absolute: 0.6 10*3/uL (ref 0.1–1.0)
Monocytes Relative: 5 %
Monocytes Relative: 6 %
Neutro Abs: 5.9 10*3/uL (ref 1.7–7.7)
Neutro Abs: 6.2 10*3/uL (ref 1.7–7.7)
Neutrophils Relative %: 66 %
Neutrophils Relative %: 61 %
Platelets: 199 10*3/uL (ref 150–400)
Platelets: 204 10*3/uL (ref 150–400)
RBC: 3.47 MIL/uL — ABNORMAL LOW (ref 3.87–5.11)
RBC: 3.42 MIL/uL — ABNORMAL LOW (ref 3.87–5.11)
RDW: 13.1 % (ref 11.5–15.5)
RDW: 13.1 % (ref 11.5–15.5)
WBC: 9.2 10*3/uL (ref 4.0–10.5)
WBC: 9.9 10*3/uL (ref 4.0–10.5)
nRBC: 0 % (ref 0.0–0.2)
nRBC: 0 % (ref 0.0–0.2)

## 2021-09-23 LAB — BASIC METABOLIC PANEL
Anion gap: 10 (ref 5–15)
Anion gap: 11 (ref 5–15)
BUN: 36 mg/dL — ABNORMAL HIGH (ref 8–23)
BUN: 36 mg/dL — ABNORMAL HIGH (ref 8–23)
CO2: 28 mmol/L (ref 22–32)
CO2: 25 mmol/L (ref 22–32)
Calcium: 10 mg/dL (ref 8.9–10.3)
Calcium: 9.8 mg/dL (ref 8.9–10.3)
Chloride: 101 mmol/L (ref 98–111)
Chloride: 101 mmol/L (ref 98–111)
Creatinine, Ser: 1.05 mg/dL — ABNORMAL HIGH (ref 0.44–1.00)
Creatinine, Ser: 1.01 mg/dL — ABNORMAL HIGH (ref 0.44–1.00)
GFR, Estimated: 58 mL/min — ABNORMAL LOW (ref >60)
GFR, Estimated: >60 mL/min (ref >60)
Glucose, Bld: 219 mg/dL — ABNORMAL HIGH (ref 70–99)
Glucose, Bld: 194 mg/dL — ABNORMAL HIGH (ref 70–99)
Potassium: 3.6 mmol/L (ref 3.5–5.1)
Potassium: 3.4 mmol/L — ABNORMAL LOW (ref 3.5–5.1)
Sodium: 139 mmol/L (ref 135–145)
Sodium: 137 mmol/L (ref 135–145)

## 2021-09-26 ENCOUNTER — Other Ambulatory Visit (INDEPENDENT_AMBULATORY_CARE_PROVIDER_SITE_OTHER): Payer: Self-pay

## 2021-09-27 ENCOUNTER — Ambulatory Visit (HOSPITAL_BASED_OUTPATIENT_CLINIC_OR_DEPARTMENT_OTHER): Payer: Medicare Other | Admitting: Anesthesiology

## 2021-09-27 ENCOUNTER — Encounter (HOSPITAL_COMMUNITY)
Admission: RE | Disposition: A | Discharge status: Home / Self Care | Payer: Self-pay | Source: Ambulatory Visit | Attending: Gastroenterology

## 2021-09-27 ENCOUNTER — Encounter (HOSPITAL_COMMUNITY): Payer: Self-pay | Admitting: Gastroenterology

## 2021-09-27 ENCOUNTER — Ambulatory Visit (HOSPITAL_COMMUNITY): Payer: Medicare Other | Admitting: Anesthesiology

## 2021-09-27 ENCOUNTER — Ambulatory Visit (HOSPITAL_COMMUNITY)
Admission: RE | Admit: 2021-09-27 | Discharge: 2021-09-27 | Disposition: A | Discharge status: Home / Self Care | Payer: Medicare Other | Source: Ambulatory Visit | Attending: Gastroenterology | Admitting: Gastroenterology

## 2021-09-27 DIAGNOSIS — Z1211 Encounter for screening for malignant neoplasm of colon: Secondary | ICD-10-CM

## 2021-09-27 DIAGNOSIS — K648 Other hemorrhoids: Secondary | ICD-10-CM

## 2021-09-27 DIAGNOSIS — K5520 Angiodysplasia of colon without hemorrhage: Secondary | ICD-10-CM | POA: Insufficient documentation

## 2021-09-27 DIAGNOSIS — E119 Type 2 diabetes mellitus without complications: Secondary | ICD-10-CM | POA: Insufficient documentation

## 2021-09-27 DIAGNOSIS — K635 Polyp of colon: Secondary | ICD-10-CM

## 2021-09-27 DIAGNOSIS — I10 Essential (primary) hypertension: Secondary | ICD-10-CM | POA: Diagnosis not present

## 2021-09-27 DIAGNOSIS — D759 Disease of blood and blood-forming organs, unspecified: Secondary | ICD-10-CM | POA: Diagnosis not present

## 2021-09-27 DIAGNOSIS — D649 Anemia, unspecified: Secondary | ICD-10-CM | POA: Diagnosis not present

## 2021-09-27 DIAGNOSIS — Z853 Personal history of malignant neoplasm of breast: Secondary | ICD-10-CM | POA: Diagnosis not present

## 2021-09-27 DIAGNOSIS — D123 Benign neoplasm of transverse colon: Secondary | ICD-10-CM | POA: Diagnosis not present

## 2021-09-27 HISTORY — PX: COLONOSCOPY WITH PROPOFOL: SHX5780

## 2021-09-27 HISTORY — PX: POLYPECTOMY: SHX5525

## 2021-09-27 LAB — HM COLONOSCOPY

## 2021-09-27 LAB — GLUCOSE, CAPILLARY: Glucose-Capillary: 160 mg/dL — ABNORMAL HIGH (ref 70–99)

## 2021-09-27 SURGERY — COLONOSCOPY WITH PROPOFOL
Anesthesia: General

## 2021-09-27 MED ORDER — LACTATED RINGERS IV SOLN: INTRAVENOUS | Status: DC

## 2021-09-27 MED ORDER — PROPOFOL 10 MG/ML IV BOLUS
INTRAVENOUS | Status: DC | PRN
  Administered 2021-09-27: 80 mg via INTRAVENOUS

## 2021-09-27 MED ORDER — STERILE WATER FOR IRRIGATION IR SOLN
Status: DC | PRN
  Administered 2021-09-27: .6 mL

## 2021-09-27 MED ORDER — METOPROLOL TARTRATE 5 MG/5ML IV SOLN
INTRAVENOUS | Status: DC | PRN
  Administered 2021-09-27: 1 mg via INTRAVENOUS
  Administered 2021-09-27: 2 mg via INTRAVENOUS

## 2021-09-27 MED ORDER — PROPOFOL 500 MG/50ML IV EMUL
INTRAVENOUS | Status: DC | PRN
  Administered 2021-09-27: 100 ug/kg/min via INTRAVENOUS

## 2021-09-27 NOTE — H&P (Signed)
Lisa Thornton is an 66 y.o. female.   Chief Complaint: Colorectal cancer screening HPI: 66 year old female with past medical history of breast cancer, diabetes, hypertension and anemia, coming for screening colonoscopy. The patient has never had a colonoscopy in the past.  The patient denies having any complaints such as melena, hematochezia, abdominal pain or distention, change in her bowel movement consistency or frequency, no changes in her weight recently.  No family history of colorectal cancer.   Past Medical History:  Diagnosis Date   Anemia 12/31/2017   Cancer (HCC)    left breast cancer   Chest pain    Diabetes mellitus without complication (HCC)    Hypertension     Past Surgical History:  Procedure Laterality Date   MASTECTOMY MODIFIED RADICAL Left 12/31/2017   Procedure: LEFT MODIFIED RADICAL MASTECTOMY;  Surgeon: Franky Macho, MD;  Location: [facility];  Service: General;  Laterality: Left;   PORTACATH PLACEMENT Right 06/27/2017   Procedure: INSERTION PORT-A-CATH;  Surgeon: Franky Macho, MD;  Location: [facility];  Service: General;  Laterality: Right;    Family History  Problem Relation Age of Onset   Breast cancer Mother    Thyroid disease Mother    Heart disease Mother    Heart attack Father    Heart attack Sister    Hypertension Sister    Heart attack Brother    Cancer Sister    Stroke Brother    Alzheimer's disease Maternal Aunt    Social History:  reports that she has never smoked. She has never used smokeless tobacco. She reports that she does not drink alcohol and does not use drugs.  Allergies: No Known Allergies  Medications Prior to Admission  Medication Sig Dispense Refill   amLODipine (NORVASC) 5 MG tablet Take 1 tablet by mouth once daily 30 tablet 0   anastrozole (ARIMIDEX) 1 MG tablet Take 1 tablet by mouth once daily 90 tablet 2   calcium carbonate (OS-CAL) 600 MG TABS tablet Take 600 mg by mouth daily with breakfast.     cholecalciferol (VITAMIN D3)  25 MCG (1000 UT) tablet Take 1,000 Units by mouth 2 (two) times daily.      Cyanocobalamin (VITAMIN B 12 PO) Take 1 tablet by mouth daily.     hydrochlorothiazide (HYDRODIURIL) 25 MG tablet Take 1 tablet by mouth once daily 90 tablet 0   metFORMIN (GLUCOPHAGE) 1000 MG tablet Take 1 tablet (1,000 mg total) by mouth daily with breakfast. 90 tablet 1   metoprolol tartrate (LOPRESSOR) 50 MG tablet Take 1 tablet (50 mg total) by mouth 2 (two) times daily. 180 tablet 1   Omega-3 Fatty Acids (FISH OIL) 1000 MG CAPS Take 1 capsule by mouth 2 (two) times daily.     rosuvastatin (CRESTOR) 5 MG tablet Take 1 tablet by mouth once daily 90 tablet 0   Semaglutide (RYBELSUS) 3 MG TABS Take 3 mg by mouth daily. 30 tablet 3   Accu-Chek Softclix Lancets lancets USE 1  TO CHECK GLUCOSE UP TO 4 TIMES DAILY 100 each 0   apixaban (ELIQUIS) 5 MG TABS tablet Take 1 tablet (5 mg total) by mouth 2 (two) times daily. 60 tablet 5   blood glucose meter kit and supplies Dispense based on patient and insurance preference. Use up to four times daily as directed. (FOR ICD-10 E10.9, E11.9). 1 each 0   glucose blood (ACCU-CHEK GUIDE) test strip USE 1 STRIP TO CHECK GLUCOSE THREE TIMES DAILY IN  THE  MORNING, NOON,  IN  THE  EVENING  AT  BEDTIME  AS  DIRECTED 100 each 0   ondansetron (ZOFRAN) 4 MG tablet Take 1 tablet (4 mg total) by mouth every 8 (eight) hours as needed for nausea or vomiting. 30 tablet 2   polyethylene glycol-electrolytes (TRILYTE) 420 g solution Take 4,000 mLs by mouth as directed. 4000 mL 0   sulfamethoxazole-trimethoprim (BACTRIM DS) 800-160 MG tablet Take 1 tablet by mouth 2 (two) times daily. 10 tablet 0    Results for orders placed or performed during the hospital encounter of 09/27/21 (from the past 48 hour(s))  Glucose, capillary     Status: Abnormal   Collection Time: 09/27/21  9:01 AM  Result Value Ref Range   Glucose-Capillary 160 (H) 70 - 99 mg/dL    Comment: Glucose reference range applies only to  samples taken after fasting for at least 8 hours.   No results found.  Review of Systems  Constitutional: Negative.   HENT: Negative.    Eyes: Negative.   Respiratory: Negative.    Cardiovascular: Negative.   Gastrointestinal: Negative.   Endocrine: Negative.   Genitourinary: Negative.   Musculoskeletal: Negative.   Skin: Negative.   Allergic/Immunologic: Negative.   Neurological: Negative.   Hematological: Negative.   Psychiatric/Behavioral: Negative.      Blood pressure (!) 142/66, pulse 96, temperature 98.6 F (37 C), resp. rate 13, SpO2 97 %. Physical Exam  GENERAL: The patient is AO x3, in no acute distress. HEENT: Head is normocephalic and atraumatic. EOMI are intact. Mouth is well hydrated and without lesions. NECK: Supple. No masses LUNGS: Clear to auscultation. No presence of rhonchi/wheezing/rales. Adequate chest expansion HEART: RRR, normal s1 and s2. ABDOMEN: Soft, nontender, no guarding, no peritoneal signs, and nondistended. BS +. No masses. EXTREMITIES: Without any cyanosis, clubbing, rash, lesions or edema. NEUROLOGIC: AOx3, no focal motor deficit. SKIN: no jaundice, no rashes  Assessment/Plan 66 year old female with past medical history of breast cancer, diabetes, hypertension and anemia, coming for screening colonoscopy. The patient is at average risk for colorectal cancer.  We will proceed with colonoscopy today.   Dolores Frame, MD 09/27/2021, 10:13 AM

## 2021-09-28 ENCOUNTER — Encounter (INDEPENDENT_AMBULATORY_CARE_PROVIDER_SITE_OTHER): Payer: Self-pay | Admitting: *Deleted

## 2021-09-28 NOTE — Anesthesia Postprocedure Evaluation (Signed)
Anesthesia Post Note  Patient: Lisa Thornton  Procedure(s) Performed: COLONOSCOPY WITH PROPOFOL POLYPECTOMY  Patient location during evaluation: Phase II Anesthesia Type: General Level of consciousness: awake Pain management: pain level controlled Vital Signs Assessment: post-procedure vital signs reviewed and stable Respiratory status: spontaneous breathing and respiratory function stable Cardiovascular status: blood pressure returned to baseline and stable Postop Assessment: no headache and no apparent nausea or vomiting Anesthetic complications: no Comments: Late entry   No notable events documented.   Last Vitals:  Vitals:   09/27/21 0915 09/27/21 1055  BP: (!) 142/66 (!) 111/51  Pulse: 96 96  Resp: 13 18  Temp: 37 C 36.6 C  SpO2: 97% 97%    Last Pain:  Vitals:   09/27/21 1055  TempSrc: Oral  PainSc: 0-No pain                 Louann Sjogren

## 2021-09-29 LAB — SURGICAL PATHOLOGY

## 2021-10-03 ENCOUNTER — Encounter (HOSPITAL_COMMUNITY): Payer: Self-pay | Admitting: Gastroenterology

## 2021-10-10 ENCOUNTER — Other Ambulatory Visit (HOSPITAL_COMMUNITY): Payer: Self-pay | Admitting: Hematology

## 2021-10-10 DIAGNOSIS — Z170 Estrogen receptor positive status [ER+]: Secondary | ICD-10-CM

## 2021-10-18 ENCOUNTER — Other Ambulatory Visit: Payer: Self-pay | Admitting: *Deleted

## 2021-10-18 DIAGNOSIS — E1165 Type 2 diabetes mellitus with hyperglycemia: Secondary | ICD-10-CM

## 2021-10-18 MED ORDER — RYBELSUS 3 MG PO TABS: 3.0000 mg | ORAL_TABLET | Freq: Every day | ORAL | 1 refills | Status: DC

## 2021-11-04 ENCOUNTER — Other Ambulatory Visit: Payer: Self-pay | Admitting: Internal Medicine

## 2021-11-04 DIAGNOSIS — I10 Essential (primary) hypertension: Secondary | ICD-10-CM

## 2021-11-14 ENCOUNTER — Ambulatory Visit (INDEPENDENT_AMBULATORY_CARE_PROVIDER_SITE_OTHER): Payer: Medicare Other | Admitting: Internal Medicine

## 2021-11-14 ENCOUNTER — Encounter: Payer: Self-pay | Admitting: Internal Medicine

## 2021-11-14 DIAGNOSIS — N3000 Acute cystitis without hematuria: Secondary | ICD-10-CM | POA: Diagnosis not present

## 2021-11-14 DIAGNOSIS — R300 Dysuria: Secondary | ICD-10-CM | POA: Diagnosis not present

## 2021-11-14 LAB — POCT URINALYSIS DIP (CLINITEK)
Bilirubin, UA: NEGATIVE
Glucose, UA: NEGATIVE mg/dL
Ketones, POC UA: NEGATIVE mg/dL
Nitrite, UA: NEGATIVE
POC PROTEIN,UA: NEGATIVE
Spec Grav, UA: 1.015 (ref 1.010–1.025)
Urobilinogen, UA: 0.2 E.U./dL
pH, UA: 5.5 (ref 5.0–8.0)

## 2021-11-14 MED ORDER — NITROFURANTOIN MONOHYD MACRO 100 MG PO CAPS: 100.0000 mg | ORAL_CAPSULE | Freq: Two times a day (BID) | ORAL | 0 refills | Status: DC

## 2021-11-14 NOTE — Progress Notes (Signed)
Virtual Visit via Telephone Note   This visit type was conducted due to national recommendations for restrictions regarding the COVID-19 Pandemic (e.g. social distancing) in an effort to limit this patient's exposure and mitigate transmission in our community.  Due to her co-morbid illnesses, this patient is at least at moderate risk for complications without adequate follow up.  This format is felt to be most appropriate for this patient at this time.  The patient did not have access to video technology/had technical difficulties with video requiring transitioning to audio format only (telephone).  All issues noted in this document were discussed and addressed.  No physical exam could be performed with this format.  Evaluation Performed:  Follow-up visit  Date:  11/14/2021   ID:  Lisa Thornton, DOB 08/12/1955, MRN [id number]  Patient Location: Home Provider Location: Office/Clinic  Participants: Patient Location of Patient: Home Location of Provider: Telehealth Consent was obtain for visit to be over via telehealth. I verified that I am speaking with the correct person using two identifiers.  PCP:  Lindell Spar, MD   Chief Complaint: Dysuria and abdominal pain  History of Present Illness:    Lisa Thornton is a 66 y.o. female who has a televisit for complaint of dysuria and abdominal pain for the last 1 week.  She also reports abdominal cramping and mild headache, which is improved with Tylenol.  She denies any fever, chills, nausea or vomiting currently.  She tried taking Azo with no relief.  The patient does not have symptoms concerning for COVID-19 infection (fever, chills, cough, or new shortness of breath).   Past Medical, Surgical, Social History, Allergies, and Medications have been Reviewed.  Past Medical History:  Diagnosis Date   Anemia 12/31/2017   Cancer ([place])    left breast cancer   Chest pain    Diabetes mellitus without complication ([place])    Hypertension     Past Surgical History:  Procedure Laterality Date   COLONOSCOPY WITH PROPOFOL N/A 09/27/2021   Procedure: COLONOSCOPY WITH PROPOFOL;  Surgeon: Harvel Quale, MD;  Location: [facility];  Service: Gastroenterology;  Laterality: N/A;  945   MASTECTOMY MODIFIED RADICAL Left 12/31/2017   Procedure: LEFT MODIFIED RADICAL MASTECTOMY;  Surgeon: Aviva Signs, MD;  Location: [facility];  Service: General;  Laterality: Left;   POLYPECTOMY  09/27/2021   Procedure: POLYPECTOMY;  Surgeon: Montez Morita, Quillian Quince, MD;  Location: [facility];  Service: Gastroenterology;;   PORTACATH PLACEMENT Right 06/27/2017   Procedure: INSERTION PORT-A-CATH;  Surgeon: Aviva Signs, MD;  Location: [facility];  Service: General;  Laterality: Right;     Current Meds  Medication Sig   Accu-Chek Softclix Lancets lancets USE 1  TO CHECK GLUCOSE UP TO 4 TIMES DAILY   amLODipine (NORVASC) 5 MG tablet Take 1 tablet by mouth once daily   anastrozole (ARIMIDEX) 1 MG tablet Take 1 tablet by mouth once daily   apixaban (ELIQUIS) 5 MG TABS tablet Take 1 tablet (5 mg total) by mouth 2 (two) times daily.   blood glucose meter kit and supplies Dispense based on patient and insurance preference. Use up to four times daily as directed. (FOR ICD-10 E10.9, E11.9).   calcium carbonate (OS-CAL) 600 MG TABS tablet Take 600 mg by mouth daily with breakfast.   cholecalciferol (VITAMIN D3) 25 MCG (1000 UT) tablet Take 1,000 Units by mouth 2 (two) times daily.    Cyanocobalamin (VITAMIN B 12 PO) Take 1 tablet by mouth  daily.   glucose blood (ACCU-CHEK GUIDE) test strip USE 1 STRIP TO CHECK GLUCOSE THREE TIMES DAILY IN  THE  MORNING, NOON,  IN  THE  EVENING  AT  BEDTIME  AS  DIRECTED   hydrochlorothiazide (HYDRODIURIL) 25 MG tablet Take 1 tablet by mouth once daily   metFORMIN (GLUCOPHAGE) 1000 MG tablet Take 1 tablet (1,000 mg total) by mouth daily with breakfast.   metoprolol tartrate (LOPRESSOR) 50 MG tablet Take 1 tablet by mouth  twice daily   Omega-3 Fatty Acids (FISH OIL) 1000 MG CAPS Take 1 capsule by mouth 2 (two) times daily.   ondansetron (ZOFRAN) 4 MG tablet Take 1 tablet (4 mg total) by mouth every 8 (eight) hours as needed for nausea or vomiting.   rosuvastatin (CRESTOR) 5 MG tablet Take 1 tablet by mouth once daily   Semaglutide (RYBELSUS) 3 MG TABS Take 3 mg by mouth daily.     Allergies:   Patient has no known allergies.   ROS:   Please see the history of present illness.     All other systems reviewed and are negative.   Labs/Other Tests and Data Reviewed:    Recent Labs: 06/15/2021: ALT 27 08/03/2021: TSH 1.970 09/23/2021: BUN 36; Creatinine, Ser 1.01; Hemoglobin 11.2; Platelets 204; Potassium 3.4; Sodium 137   Recent Lipid Panel Lab Results  Component Value Date/Time   CHOL 117 11/11/2020 01:59 PM   TRIG 133 11/11/2020 01:59 PM   HDL 39 (L) 11/11/2020 01:59 PM   CHOLHDL 3.0 11/11/2020 01:59 PM   LDLCALC 55 11/11/2020 01:59 PM    Wt Readings from Last 3 Encounters:  09/23/21 157 lb (71.2 kg)  07/18/21 157 lb (71.2 kg)  06/22/21 158 lb 3.2 oz (71.8 kg)     ASSESSMENT & PLAN:    Acute cystitis without hematuria     UA reviewed Check urine culture Started empiric Macrobid Advised to improve fluid intake  Time:   Today, I have spent 9 minutes reviewing the chart, including problem list, medications, and with the patient with telehealth technology discussing the above problems.   Medication Adjustments/Labs and Tests Ordered: Current medicines are reviewed at length with the patient today.  Concerns regarding medicines are outlined above.   Tests Ordered: Orders Placed This Encounter  Procedures   Urine Culture   POCT URINALYSIS DIP (CLINITEK)    Medication Changes: No orders of the defined types were placed in this encounter.    Note: This dictation was prepared with Dragon dictation along with smaller phrase technology. Similar sounding words can be transcribed  inadequately or may not be corrected upon review. Any transcriptional errors that result from this process are unintentional.      Disposition:  Follow up  Signed, Lindell Spar, MD  11/14/2021 4:35 PM     [place]

## 2021-11-15 ENCOUNTER — Telehealth: Payer: Medicare Other | Admitting: Internal Medicine

## 2021-11-16 LAB — URINE CULTURE

## 2021-11-21 ENCOUNTER — Telehealth: Payer: Self-pay | Admitting: Internal Medicine

## 2021-11-21 NOTE — Telephone Encounter (Signed)
Patient aware.

## 2021-11-21 NOTE — Telephone Encounter (Signed)
Pt called stating the abx she was prescribed she finished Saturday. States the abx help some but she is not better. She was exposed to [place] on Friday. Wants to know if she can get another abs?

## 2021-11-24 ENCOUNTER — Encounter: Payer: Medicare Other | Admitting: Internal Medicine

## 2021-12-08 ENCOUNTER — Encounter: Payer: Self-pay | Admitting: Internal Medicine

## 2021-12-08 ENCOUNTER — Ambulatory Visit (INDEPENDENT_AMBULATORY_CARE_PROVIDER_SITE_OTHER): Payer: Medicare Other | Admitting: Internal Medicine

## 2021-12-08 VITALS — BP 128/84 | HR 100 | Resp 18 | Ht 64.0 in | Wt 158.6 lb

## 2021-12-08 DIAGNOSIS — E782 Mixed hyperlipidemia: Secondary | ICD-10-CM | POA: Diagnosis not present

## 2021-12-08 DIAGNOSIS — E1140 Type 2 diabetes mellitus with diabetic neuropathy, unspecified: Secondary | ICD-10-CM | POA: Diagnosis not present

## 2021-12-08 DIAGNOSIS — Z0001 Encounter for general adult medical examination with abnormal findings: Secondary | ICD-10-CM | POA: Diagnosis not present

## 2021-12-08 DIAGNOSIS — Z23 Encounter for immunization: Secondary | ICD-10-CM

## 2021-12-08 DIAGNOSIS — R300 Dysuria: Secondary | ICD-10-CM

## 2021-12-08 DIAGNOSIS — I10 Essential (primary) hypertension: Secondary | ICD-10-CM | POA: Diagnosis not present

## 2021-12-08 DIAGNOSIS — Z1159 Encounter for screening for other viral diseases: Secondary | ICD-10-CM

## 2021-12-08 DIAGNOSIS — E559 Vitamin D deficiency, unspecified: Secondary | ICD-10-CM | POA: Diagnosis not present

## 2021-12-08 DIAGNOSIS — I4891 Unspecified atrial fibrillation: Secondary | ICD-10-CM | POA: Diagnosis not present

## 2021-12-08 MED ORDER — ROSUVASTATIN CALCIUM 5 MG PO TABS: 5.0000 mg | ORAL_TABLET | Freq: Every day | ORAL | 3 refills | Status: DC

## 2021-12-08 NOTE — Assessment & Plan Note (Signed)
Physical exam as documented. Fasting blood tests today. 

## 2021-12-08 NOTE — Assessment & Plan Note (Signed)
Lab Results  Component Value Date   HGBA1C 7.8 (H) 08/03/2021   With HTN and HLD On Metformin 1000 mg QD On Rybelsus 3 mg QD for now, plan to increase dose Advised to follow diabetic diet On statin F/u CMP and lipid panel Diabetic eye exam: Advised to follow up with Ophthalmology for diabetic eye exam

## 2021-12-08 NOTE — Assessment & Plan Note (Signed)
BP Readings from Last 1 Encounters:  12/08/21 128/84   Well-controlled with amlodipine, HCTZ and metoprolol Counseled for compliance with the medications Advised DASH diet and moderate exercise/walking, at least 150 mins/week

## 2021-12-08 NOTE — Patient Instructions (Addendum)
Please continue taking medications as prescribed.  Please continue to follow low carb diet and ambulate as tolerated.  Please consider getting Tdap at your local pharmacy.

## 2021-12-08 NOTE — Progress Notes (Signed)
Established Patient Office Visit  Subjective:  Patient ID: Lisa Thornton, female    DOB: 08-11-1955  Age: 66 y.o. MRN: [id number]  CC:  Chief Complaint  Patient presents with   Annual Exam    Annual exam pt still having uti symptoms bladder leakage lower abdomen pain since 8-14    HPI Lisa Thornton is a 66 y.o. female with past medical history of HTN, type 2 DM and breast ca s/p mastectomy and chemotherapy who presents for annual physical.  She still complains of dysuria and urinary frequency for the last 3 weeks.  She was recently treated with Macrobid for UTI, after which her symptoms had improved for few days.  She denies any fever, chills, nausea or vomiting currently.  Type II DM: Her last HbA1c was 7.8. Her blood glucose has been around 120-140 most of the time. Denies any polyuria or polyphagia. Denies any dysuria or hematuria.   HTN: BP is well-controlled. Takes medications regularly. Patient denies headache, dizziness, chest pain, dyspnea or palpitations.  Atrial fibrillation: She was found to have atrial fibrillation in the last visit.  She was placed on Eliquis and was referred to cardiology, but has not not seen them yet.  She denies any dizziness or palpitations currently.  Denies any active bleeding.  Past Medical History:  Diagnosis Date   Anemia 12/31/2017   Cancer (HCC)    left breast cancer   Chest pain    Diabetes mellitus without complication (HCC)    Hypertension     Past Surgical History:  Procedure Laterality Date   COLONOSCOPY WITH PROPOFOL N/A 09/27/2021   Procedure: COLONOSCOPY WITH PROPOFOL;  Surgeon: Dolores Frame, MD;  Location: [facility];  Service: Gastroenterology;  Laterality: N/A;  945   MASTECTOMY MODIFIED RADICAL Left 12/31/2017   Procedure: LEFT MODIFIED RADICAL MASTECTOMY;  Surgeon: Franky Macho, MD;  Location: [facility];  Service: General;  Laterality: Left;   POLYPECTOMY  09/27/2021   Procedure: POLYPECTOMY;  Surgeon:  Dolores Frame, MD;  Location: [facility];  Service: Gastroenterology;;   PORTACATH PLACEMENT Right 06/27/2017   Procedure: INSERTION PORT-A-CATH;  Surgeon: Franky Macho, MD;  Location: [facility];  Service: General;  Laterality: Right;    Family History  Problem Relation Age of Onset   Breast cancer Mother    Thyroid disease Mother    Heart disease Mother    Heart attack Father    Heart attack Sister    Hypertension Sister    Heart attack Brother    Cancer Sister    Stroke Brother    Alzheimer's disease Maternal Aunt     Social History   Socioeconomic History   Marital status: Divorced    Spouse name: Not on file   Number of children: 2   Years of education: 10   Highest education level: 10th grade  Occupational History   Occupation: Psychologist, 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  Tobacco Use   Smoking status: Never   Smokeless tobacco: Never  Vaping Use   Vaping Use: Never used  Substance and Sexual Activity   Alcohol use: No   Drug use: No   Sexual activity: Not Currently  Other Topics Concern   Not on file  Social History Narrative   Lives with sister   2 children   Dog: Cozie      Enjoys: puzzles, sewing, and walk      Diet: eats all food groups outside: leafy greens    Caffeine: limited, sweet tea  Water: 6-8 cups daily       No car; does have license, wears seat belt   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   [place] area    Social Determinants of Health   Financial Resource Strain: Low Risk  (03/29/2021)   Overall Financial Resource Strain (CARDIA)    Difficulty of Paying Living Expenses: Not hard at all  Food Insecurity: No Food Insecurity (03/29/2021)   Hunger Vital Sign    Worried About Running Out of Food in the Last Year: Never true    Ran Out of Food in the Last Year: Never true  Transportation Needs: No Transportation Needs (03/29/2021)   PRAPARE - Hydrologist (Medical): No    Lack of Transportation (Non-Medical): No  Physical Activity:  Insufficiently Active (03/29/2021)   Exercise Vital Sign    Days of Exercise per Week: 7 days    Minutes of Exercise per Session: 20 min  Stress: No Stress Concern Present (03/29/2021)   [place]    Feeling of Stress : Not at all  Social Connections: Socially Isolated (03/29/2021)   Social Connection and Isolation Panel [NHANES]    Frequency of Communication with Friends and Family: More than three times a week    Frequency of Social Gatherings with Friends and Family: Once a week    Attends Religious Services: Never    Marine scientist or Organizations: No    Attends Archivist Meetings: Never    Marital Status: Divorced  Human resources officer Violence: Not At Risk (03/29/2021)   Humiliation, Afraid, Rape, and Kick questionnaire    Fear of Current or Ex-Partner: No    Emotionally Abused: No    Physically Abused: No    Sexually Abused: No    Outpatient Medications Prior to Visit  Medication Sig Dispense Refill   Accu-Chek Softclix Lancets lancets USE 1  TO CHECK GLUCOSE UP TO 4 TIMES DAILY 100 each 0   amLODipine (NORVASC) 5 MG tablet Take 1 tablet by mouth once daily 30 tablet 0   anastrozole (ARIMIDEX) 1 MG tablet Take 1 tablet by mouth once daily 90 tablet 0   apixaban (ELIQUIS) 5 MG TABS tablet Take 1 tablet (5 mg total) by mouth 2 (two) times daily. 60 tablet 5   blood glucose meter kit and supplies Dispense based on patient and insurance preference. Use up to four times daily as directed. (FOR ICD-10 E10.9, E11.9). 1 each 0   calcium carbonate (OS-CAL) 600 MG TABS tablet Take 600 mg by mouth daily with breakfast.     cholecalciferol (VITAMIN D3) 25 MCG (1000 UT) tablet Take 1,000 Units by mouth 2 (two) times daily.      Cyanocobalamin (VITAMIN B 12 PO) Take 1 tablet by mouth daily.     glucose blood (ACCU-CHEK GUIDE) test strip USE 1 STRIP TO CHECK GLUCOSE THREE TIMES DAILY IN  THE  MORNING, NOON,   IN  THE  EVENING  AT  BEDTIME  AS  DIRECTED 100 each 0   hydrochlorothiazide (HYDRODIURIL) 25 MG tablet Take 1 tablet by mouth once daily 90 tablet 0   metFORMIN (GLUCOPHAGE) 1000 MG tablet Take 1 tablet (1,000 mg total) by mouth daily with breakfast. 90 tablet 1   metoprolol tartrate (LOPRESSOR) 50 MG tablet Take 1 tablet by mouth twice daily 180 tablet 0   Omega-3 Fatty Acids (FISH OIL) 1000 MG CAPS Take 1 capsule by mouth 2 (two)  times daily.     ondansetron (ZOFRAN) 4 MG tablet Take 1 tablet (4 mg total) by mouth every 8 (eight) hours as needed for nausea or vomiting. 30 tablet 2   Semaglutide (RYBELSUS) 3 MG TABS Take 3 mg by mouth daily. 90 tablet 1   rosuvastatin (CRESTOR) 5 MG tablet Take 1 tablet by mouth once daily 90 tablet 0   nitrofurantoin, macrocrystal-monohydrate, (MACROBID) 100 MG capsule Take 1 capsule (100 mg total) by mouth 2 (two) times daily. (Patient not taking: Reported on 12/08/2021) 10 capsule 0   No facility-administered medications prior to visit.    No Known Allergies  ROS Review of Systems  Constitutional:  Positive for fatigue. Negative for chills and fever.  HENT:  Negative for congestion, sinus pressure, sinus pain and sore throat.   Eyes:  Negative for pain and discharge.  Respiratory:  Negative for cough and shortness of breath.   Cardiovascular:  Negative for chest pain and palpitations.  Gastrointestinal:  Negative for abdominal pain, diarrhea, nausea and vomiting.  Endocrine: Negative for polydipsia and polyuria.  Genitourinary:  Positive for dysuria and frequency. Negative for hematuria.  Musculoskeletal:  Negative for neck pain and neck stiffness.  Skin:  Negative for rash.  Neurological:  Negative for dizziness and weakness.  Psychiatric/Behavioral:  Negative for agitation and behavioral problems.       Objective:    Physical Exam Vitals reviewed.  Constitutional:      General: She is not in acute distress.    Appearance: She is not  diaphoretic.  HENT:     Head: Normocephalic and atraumatic.     Nose: Nose normal. No congestion.     Mouth/Throat:     Mouth: Mucous membranes are moist.     Pharynx: No posterior oropharyngeal erythema.  Eyes:     General: No scleral icterus.    Extraocular Movements: Extraocular movements intact.  Neck:     Vascular: No carotid bruit.  Cardiovascular:     Rate and Rhythm: Normal rate. Rhythm irregular.     Pulses: Normal pulses.     Heart sounds: Normal heart sounds. No murmur heard. Pulmonary:     Breath sounds: Normal breath sounds. No wheezing or rales.  Abdominal:     Palpations: Abdomen is soft.     Tenderness: There is no abdominal tenderness.  Musculoskeletal:     Cervical back: Neck supple. No tenderness.     Right lower leg: No edema.     Left lower leg: No edema.  Feet:     Right foot:     Toenail Condition: Right toenails are abnormally thick. Fungal disease present.    Left foot:     Toenail Condition: Left toenails are abnormally thick. Fungal disease present. Skin:    General: Skin is warm.     Findings: No rash.  Neurological:     General: No focal deficit present.     Mental Status: She is alert and oriented to person, place, and time.     Sensory: No sensory deficit.     Motor: No weakness.  Psychiatric:        Mood and Affect: Mood normal.        Behavior: Behavior normal.     BP 128/84 (BP Location: Right Arm, Patient Position: Sitting, Cuff Size: Normal)   Pulse 100   Resp 18   Ht $R'5\' 4"'iz$  (1.626 m)   Wt 158 lb 9.6 oz (71.9 kg)   SpO2 98%   BMI  27.22 kg/m  Wt Readings from Last 3 Encounters:  12/08/21 158 lb 9.6 oz (71.9 kg)  09/23/21 157 lb (71.2 kg)  07/18/21 157 lb (71.2 kg)    Lab Results  Component Value Date   TSH 1.970 08/03/2021   Lab Results  Component Value Date   WBC 9.9 09/23/2021   HGB 11.2 (L) 09/23/2021   HCT 31.8 (L) 09/23/2021   MCV 93.0 09/23/2021   PLT 204 09/23/2021   Lab Results  Component Value Date   NA  137 09/23/2021   K 3.4 (L) 09/23/2021   CO2 25 09/23/2021   GLUCOSE 194 (H) 09/23/2021   BUN 36 (H) 09/23/2021   CREATININE 1.01 (H) 09/23/2021   BILITOT 0.7 06/15/2021   ALKPHOS 48 06/15/2021   AST 53 (H) 06/15/2021   ALT 27 06/15/2021   PROT 8.6 (H) 06/15/2021   ALBUMIN 4.0 06/15/2021   CALCIUM 9.8 09/23/2021   ANIONGAP 11 09/23/2021   EGFR 65 03/17/2021   Lab Results  Component Value Date   CHOL 117 11/11/2020   Lab Results  Component Value Date   HDL 39 (L) 11/11/2020   Lab Results  Component Value Date   LDLCALC 55 11/11/2020   Lab Results  Component Value Date   TRIG 133 11/11/2020   Lab Results  Component Value Date   CHOLHDL 3.0 11/11/2020   Lab Results  Component Value Date   HGBA1C 7.8 (H) 08/03/2021      Assessment & Plan:   Problem List Items Addressed This Visit       Cardiovascular and Mediastinum   Hypertension    BP Readings from Last 1 Encounters:  12/08/21 128/84  Well-controlled with amlodipine, HCTZ and metoprolol Counseled for compliance with the medications Advised DASH diet and moderate exercise/walking, at least 150 mins/week      Relevant Medications   rosuvastatin (CRESTOR) 5 MG tablet   Other Relevant Orders   CBC with Differential/Platelet   Atrial fibrillation ([place])    CHADS-VASc - 4.  On Eliquis for [facility] Continue metoprolol for rate control Referred to cardiology      Relevant Medications   rosuvastatin (CRESTOR) 5 MG tablet   Other Relevant Orders   TSH   CBC with Differential/Platelet     Endocrine   Type 2 diabetes mellitus with diabetic neuropathy, unspecified (HCC)    Lab Results  Component Value Date   HGBA1C 7.8 (H) 08/03/2021  With HTN and HLD On Metformin 1000 mg QD On Rybelsus 3 mg QD for now, plan to increase dose Advised to follow diabetic diet On statin F/u CMP and lipid panel Diabetic eye exam: Advised to follow up with Ophthalmology for diabetic eye exam      Relevant Medications    rosuvastatin (CRESTOR) 5 MG tablet   Other Relevant Orders   Hemoglobin A1c   CMP14+EGFR   CBC with Differential/Platelet     Other   Encounter for general adult medical examination with abnormal findings - Primary    Physical exam as documented. Fasting blood tests today.      Relevant Orders   TSH   CBC with Differential/Platelet   Other Visit Diagnoses     Mixed hyperlipidemia       Relevant Medications   rosuvastatin (CRESTOR) 5 MG tablet   Other Relevant Orders   Lipid panel   Vitamin D deficiency       Relevant Orders   VITAMIN D 25 Hydroxy (Vit-D Deficiency, Fractures)   Dysuria  Dysuria and urinary frequency, chronic Check UA with reflex culture Advised to maintain adequate hydration   Relevant Orders   UA/M w/rflx Culture, Routine   Need for hepatitis C screening test       Relevant Orders   Hepatitis C Antibody   Need for immunization against influenza       Relevant Orders   Flu Vaccine QUAD High Dose(Fluad) (Completed)       Meds ordered this encounter  Medications   rosuvastatin (CRESTOR) 5 MG tablet    Sig: Take 1 tablet (5 mg total) by mouth daily.    Dispense:  90 tablet    Refill:  3    Follow-up: Return in about 4 months (around 04/09/2022) for DM and HTN.    Lindell Spar, MD

## 2021-12-08 NOTE — Assessment & Plan Note (Signed)
CHADS-VASc - 4.  On Eliquis for [facility] Continue metoprolol for rate control Referred to cardiology

## 2021-12-12 ENCOUNTER — Other Ambulatory Visit: Payer: Self-pay | Admitting: *Deleted

## 2021-12-12 DIAGNOSIS — I10 Essential (primary) hypertension: Secondary | ICD-10-CM

## 2021-12-12 DIAGNOSIS — E1140 Type 2 diabetes mellitus with diabetic neuropathy, unspecified: Secondary | ICD-10-CM | POA: Diagnosis not present

## 2021-12-12 DIAGNOSIS — Z1159 Encounter for screening for other viral diseases: Secondary | ICD-10-CM | POA: Diagnosis not present

## 2021-12-12 DIAGNOSIS — E782 Mixed hyperlipidemia: Secondary | ICD-10-CM | POA: Diagnosis not present

## 2021-12-12 DIAGNOSIS — R300 Dysuria: Secondary | ICD-10-CM | POA: Diagnosis not present

## 2021-12-12 DIAGNOSIS — E559 Vitamin D deficiency, unspecified: Secondary | ICD-10-CM | POA: Diagnosis not present

## 2021-12-13 LAB — CBC
Hematocrit: 32.7 % — ABNORMAL LOW (ref 34.0–46.6)
Hemoglobin: 10.9 g/dL — ABNORMAL LOW (ref 11.1–15.9)
MCH: 32.1 pg (ref 26.6–33.0)
MCHC: 33.3 g/dL (ref 31.5–35.7)
MCV: 96 fL (ref 79–97)
Platelets: 210 10*3/uL (ref 150–450)
RBC: 3.4 x10E6/uL — ABNORMAL LOW (ref 3.77–5.28)
RDW: 13.1 % (ref 11.7–15.4)
WBC: 10.1 10*3/uL (ref 3.4–10.8)

## 2021-12-13 LAB — CMP14+EGFR
ALT: 34 IU/L — ABNORMAL HIGH (ref 0–32)
AST: 54 IU/L — ABNORMAL HIGH (ref 0–40)
Albumin/Globulin Ratio: 1.1 — ABNORMAL LOW (ref 1.2–2.2)
Albumin: 4.1 g/dL (ref 3.9–4.9)
Alkaline Phosphatase: 61 IU/L (ref 44–121)
BUN/Creatinine Ratio: 15 (ref 12–28)
BUN: 13 mg/dL (ref 8–27)
Bilirubin Total: 0.5 mg/dL (ref 0.0–1.2)
CO2: 23 mmol/L (ref 20–29)
Calcium: 9.5 mg/dL (ref 8.7–10.3)
Chloride: 100 mmol/L (ref 96–106)
Creatinine, Ser: 0.86 mg/dL (ref 0.57–1.00)
Globulin, Total: 3.8 g/dL (ref 1.5–4.5)
Glucose: 185 mg/dL — ABNORMAL HIGH (ref 70–99)
Potassium: 3.7 mmol/L (ref 3.5–5.2)
Sodium: 142 mmol/L (ref 134–144)
Total Protein: 7.9 g/dL (ref 6.0–8.5)
eGFR: 74 mL/min/{1.73_m2} (ref >59)

## 2021-12-13 LAB — HEPATITIS C ANTIBODY: Hep C Virus Ab: NONREACTIVE

## 2021-12-13 LAB — VITAMIN D 25 HYDROXY (VIT D DEFICIENCY, FRACTURES): Vit D, 25-Hydroxy: 39 ng/mL (ref 30.0–100.0)

## 2021-12-13 LAB — LIPID PANEL
Chol/HDL Ratio: 5.4 ratio — ABNORMAL HIGH (ref 0.0–4.4)
Cholesterol, Total: 212 mg/dL — ABNORMAL HIGH (ref 100–199)
HDL: 39 mg/dL — ABNORMAL LOW (ref >39)
LDL Chol Calc (NIH): 136 mg/dL — ABNORMAL HIGH (ref 0–99)
Triglycerides: 203 mg/dL — ABNORMAL HIGH (ref 0–149)
VLDL Cholesterol Cal: 37 mg/dL (ref 5–40)

## 2021-12-13 LAB — HEMOGLOBIN A1C
Est. average glucose Bld gHb Est-mCnc: 177 mg/dL
Hgb A1c MFr Bld: 7.8 % — ABNORMAL HIGH (ref 4.8–5.6)

## 2021-12-13 LAB — TSH: TSH: 1.48 u[IU]/mL (ref 0.450–4.500)

## 2021-12-14 ENCOUNTER — Other Ambulatory Visit: Payer: Self-pay | Admitting: Internal Medicine

## 2021-12-14 DIAGNOSIS — I10 Essential (primary) hypertension: Secondary | ICD-10-CM

## 2021-12-15 LAB — URINE CULTURE

## 2021-12-19 ENCOUNTER — Inpatient Hospital Stay: Payer: Medicare Other | Attending: Hematology

## 2021-12-19 DIAGNOSIS — C50412 Malignant neoplasm of upper-outer quadrant of left female breast: Secondary | ICD-10-CM | POA: Diagnosis not present

## 2021-12-19 DIAGNOSIS — M8580 Other specified disorders of bone density and structure, unspecified site: Secondary | ICD-10-CM | POA: Insufficient documentation

## 2021-12-19 DIAGNOSIS — Z9012 Acquired absence of left breast and nipple: Secondary | ICD-10-CM | POA: Insufficient documentation

## 2021-12-19 DIAGNOSIS — Z79811 Long term (current) use of aromatase inhibitors: Secondary | ICD-10-CM | POA: Diagnosis not present

## 2021-12-19 DIAGNOSIS — Z170 Estrogen receptor positive status [ER+]: Secondary | ICD-10-CM | POA: Diagnosis not present

## 2021-12-19 DIAGNOSIS — R252 Cramp and spasm: Secondary | ICD-10-CM | POA: Insufficient documentation

## 2021-12-19 LAB — CBC WITH DIFFERENTIAL/PLATELET
Abs Immature Granulocytes: 0.05 10*3/uL (ref 0.00–0.07)
Basophils Absolute: 0.1 10*3/uL (ref 0.0–0.1)
Basophils Relative: 1 %
Eosinophils Absolute: 0.3 10*3/uL (ref 0.0–0.5)
Eosinophils Relative: 3 %
HCT: 32.1 % — ABNORMAL LOW (ref 36.0–46.0)
Hemoglobin: 11 g/dL — ABNORMAL LOW (ref 12.0–15.0)
Immature Granulocytes: 1 %
Lymphocytes Relative: 28 %
Lymphs Abs: 2.5 10*3/uL (ref 0.7–4.0)
MCH: 32 pg (ref 26.0–34.0)
MCHC: 34.3 g/dL (ref 30.0–36.0)
MCV: 93.3 fL (ref 80.0–100.0)
Monocytes Absolute: 0.5 10*3/uL (ref 0.1–1.0)
Monocytes Relative: 5 %
Neutro Abs: 5.5 10*3/uL (ref 1.7–7.7)
Neutrophils Relative %: 62 %
Platelets: 199 10*3/uL (ref 150–400)
RBC: 3.44 MIL/uL — ABNORMAL LOW (ref 3.87–5.11)
RDW: 13.2 % (ref 11.5–15.5)
WBC: 8.8 10*3/uL (ref 4.0–10.5)
nRBC: 0 % (ref 0.0–0.2)

## 2021-12-19 LAB — COMPREHENSIVE METABOLIC PANEL
ALT: 37 U/L (ref 0–44)
AST: 70 U/L — ABNORMAL HIGH (ref 15–41)
Albumin: 3.5 g/dL (ref 3.5–5.0)
Alkaline Phosphatase: 48 U/L (ref 38–126)
Anion gap: 11 (ref 5–15)
BUN: 17 mg/dL (ref 8–23)
CO2: 26 mmol/L (ref 22–32)
Calcium: 10 mg/dL (ref 8.9–10.3)
Chloride: 98 mmol/L (ref 98–111)
Creatinine, Ser: 1.02 mg/dL — ABNORMAL HIGH (ref 0.44–1.00)
GFR, Estimated: >60 mL/min (ref >60)
Glucose, Bld: 322 mg/dL — ABNORMAL HIGH (ref 70–99)
Potassium: 3.6 mmol/L (ref 3.5–5.1)
Sodium: 135 mmol/L (ref 135–145)
Total Bilirubin: 0.7 mg/dL (ref 0.3–1.2)
Total Protein: 8.5 g/dL — ABNORMAL HIGH (ref 6.5–8.1)

## 2021-12-19 LAB — VITAMIN D 25 HYDROXY (VIT D DEFICIENCY, FRACTURES): Vit D, 25-Hydroxy: 51.57 ng/mL (ref 30–100)

## 2021-12-19 MED ORDER — HEPARIN SOD (PORK) LOCK FLUSH 100 UNIT/ML IV SOLN
500.0000 [IU] | Freq: Once | INTRAVENOUS | Status: AC
  Administered 2021-12-19: 500 [IU] via INTRAVENOUS

## 2021-12-19 MED ORDER — SODIUM CHLORIDE 0.9% FLUSH
10.0000 mL | INTRAVENOUS | Status: DC | PRN
  Administered 2021-12-19: 10 mL via INTRAVENOUS

## 2021-12-20 LAB — PTH, INTACT AND CALCIUM
Calcium, Total (PTH): 10.1 mg/dL (ref 8.7–10.3)
PTH: 29 pg/mL (ref 15–65)

## 2021-12-21 ENCOUNTER — Telehealth: Payer: Self-pay

## 2021-12-21 NOTE — Telephone Encounter (Signed)
Spoke with patient.

## 2021-12-21 NOTE — Telephone Encounter (Signed)
Patient calling about uti results

## 2021-12-22 ENCOUNTER — Other Ambulatory Visit: Payer: Self-pay | Admitting: Internal Medicine

## 2021-12-22 DIAGNOSIS — I10 Essential (primary) hypertension: Secondary | ICD-10-CM

## 2021-12-24 NOTE — Progress Notes (Deleted)
RESCHEDULE 

## 2021-12-26 ENCOUNTER — Inpatient Hospital Stay: Payer: Medicare Other | Admitting: Physician Assistant

## 2021-12-26 ENCOUNTER — Other Ambulatory Visit: Payer: Self-pay | Admitting: Internal Medicine

## 2021-12-26 DIAGNOSIS — E1165 Type 2 diabetes mellitus with hyperglycemia: Secondary | ICD-10-CM

## 2021-12-26 NOTE — Progress Notes (Unsigned)
Lisa Thornton, [place] [address]   CLINIC:  Medical Oncology/Hematology  PCP:  Lisa Spar, MD [address] / [place] [address] [phone]   REASON FOR VISIT:  Follow-up for history of stage IIIb poorly differentiated left breast cancer  PRIOR THERAPY: - Neoadjuvant dose dense AC x4 cycles from 07/18/2017 through 09/03/2017 - Paclitaxel weekly x12 cycles from 09/19/2017 through 12/06/2017 - Left modified radical mastectomy on 12/31/2017 - Herceptin x1 year, completed 03/04/2019  CURRENT THERAPY: Anastrozole (since 02/27/2018)  BRIEF ONCOLOGIC HISTORY:  Oncology History  Breast cancer of upper-outer quadrant of left female breast ([place])  06/22/2017 Initial Diagnosis   Breast cancer of upper-outer quadrant of left female breast ([place])   06/22/2017 Cancer Staging   Staging form: Breast, AJCC 8th Edition - Clinical stage from 06/22/2017: Stage IIIB (cT3, cN1, cM0, G3, ER+, PR-, HER2-) - Signed by Lisa Jack, MD on 06/22/2017   06/29/2017 Imaging   CT chest showing left axillary adenopathy, 1.2 cm anterior carinal adenopathy, left subpectoral lymph node subcentimeter  06/25/2017 2D echocardiogram with ejection fraction of 55-60%   07/18/2017 - 12/06/2017 Chemotherapy   The patient had dexamethasone (DECADRON) 4 MG tablet, 1 of 1 cycle, Start date: --, End date: -- DOXOrubicin (ADRIAMYCIN) chemo injection 110 mg, 60 mg/m2 = 110 mg, Intravenous,  Once, 4 of 4 cycles Administration: 110 mg (07/18/2017), 110 mg (08/01/2017), 110 mg (08/20/2017), 110 mg (09/03/2017) palonosetron (ALOXI) injection 0.25 mg, 0.25 mg, Intravenous,  Once, 4 of 4 cycles Administration: 0.25 mg (07/18/2017), 0.25 mg (08/01/2017), 0.25 mg (08/20/2017), 0.25 mg (09/03/2017) pegfilgrastim-cbqv (UDENYCA) injection 6 mg, 6 mg, Subcutaneous, Once, 4 of 4 cycles Administration: 6 mg (07/19/2017), 6 mg (08/02/2017), 6 mg (08/22/2017), 6 mg (09/05/2017) cyclophosphamide (CYTOXAN) 1,100 mg in  sodium chloride 0.9 % 250 mL chemo infusion, 600 mg/m2 = 1,100 mg, Intravenous,  Once, 4 of 4 cycles Administration: 1,100 mg (07/18/2017), 1,100 mg (08/01/2017), 1,100 mg (08/20/2017), 1,100 mg (09/03/2017) PACLitaxel (TAXOL) 120 mg in sodium chloride 0.9 % 250 mL chemo infusion (</= 2m/m2), 64 mg/m2 = 120 mg (80 % of original dose 80 mg/m2), Intravenous,  Once, 12 of 12 cycles Dose modification: 64 mg/m2 (80 % of original dose 80 mg/m2, Cycle 5, Reason: Provider Judgment), 60 mg/m2 (75 % of original dose 80 mg/m2, Cycle 15, Reason: Other (see comments)), 40 mg/m2 (50 % of original dose 80 mg/m2, Cycle 16, Reason: Other (see comments)) Administration: 120 mg (09/17/2017), 144 mg (09/24/2017), 144 mg (10/03/2017), 144 mg (10/11/2017), 144 mg (10/18/2017), 144 mg (10/25/2017), 144 mg (11/01/2017), 144 mg (11/08/2017), 144 mg (11/15/2017), 144 mg (11/22/2017), 108 mg (11/29/2017), 72 mg (12/06/2017) fosaprepitant (EMEND) 150 mg, dexamethasone (DECADRON) 12 mg in sodium chloride 0.9 % 145 mL IVPB, , Intravenous,  Once, 4 of 4 cycles Administration:  (07/18/2017),  (08/01/2017),  (08/20/2017),  (09/03/2017)  for chemotherapy treatment.    02/07/2018 - 03/04/2019 Chemotherapy   Patient is on Treatment Plan : BREAST Trastuzumab q21d      Radiation Therapy       CANCER STAGING: Cancer Staging  Breast cancer of upper-outer quadrant of left female breast ([facility] Staging form: Breast, AJCC 8th Edition - Clinical stage from 06/22/2017: Stage IIIB (cT3, cN1, cM0, G3, ER+, PR-, HER2-) - Signed by Lisa Jack MD on 06/22/2017   INTERVAL HISTORY:  Lisa Thornton a 66y.o. female, returns for routine follow-up of her history of left-sided breast cancer. TNichelewas last seen on 06/22/2021  by Dr. Delton Thornton.   At today's visit, she  reports feeling well.  She denies any recent hospitalizations, surgeries, or changes in her  baseline health status.  She denies any symptoms of recurrence such as new lumps, bone pain, chest  pain, dyspnea, or abdominal pain.  She has no new headaches, seizures, or focal neurologic deficits.  No B symptoms such as fever, chills, night sweats, unintentional weight loss.  She continues to take Arimidex.  She reports mild hair loss and intermittent leg cramps at night.  No hot flashes or mood swings.  She takes 1000 units vitamin D twice daily and 600 mg calcium daily.    She reports 80% energy and 100% appetite.  She is maintaining stable weight at this time.   REVIEW OF SYSTEMS:  Review of Systems  Constitutional:  Negative for appetite change, chills, diaphoresis, fatigue, fever and unexpected weight change.  HENT:   Negative for lump/mass and nosebleeds.   Eyes:  Negative for eye problems.  Respiratory:  Negative for cough, hemoptysis and shortness of breath.   Cardiovascular:  Negative for chest pain, leg swelling and palpitations.  Gastrointestinal:  Positive for nausea. Negative for abdominal pain, blood in stool, constipation, diarrhea and vomiting.  Genitourinary:  Positive for dysuria. Negative for hematuria.   Skin: Negative.   Neurological:  Negative for dizziness, headaches and light-headedness.  Hematological:  Does not bruise/bleed easily.    PAST MEDICAL/SURGICAL HISTORY:  Past Medical History:  Diagnosis Date   Anemia 12/31/2017   Cancer ([place])    left breast cancer   Chest pain    Diabetes mellitus without complication ([place])    Hypertension    Past Surgical History:  Procedure Laterality Date   COLONOSCOPY WITH PROPOFOL N/A 09/27/2021   Procedure: COLONOSCOPY WITH PROPOFOL;  Surgeon: Lisa Quale, MD;  [facility]: [facility];  Service: Gastroenterology;  Laterality: N/A;  945   MASTECTOMY MODIFIED RADICAL Left 12/31/2017   Procedure: LEFT MODIFIED RADICAL MASTECTOMY;  Surgeon: Lisa Signs, MD;  Location: [facility];  Service: General;  Laterality: Left;   POLYPECTOMY  09/27/2021   Procedure: POLYPECTOMY;  Surgeon: Lisa Quale, MD;   Location: [facility];  Service: Gastroenterology;;   PORTACATH PLACEMENT Right 06/27/2017   Procedure: INSERTION PORT-A-CATH;  Surgeon: Lisa Signs, MD;  Location: [facility];  Service: General;  Laterality: Right;    SOCIAL HISTORY:  Social History   Socioeconomic History   Marital status: Divorced    Spouse name: Not on file   Number of children: 2   Years of education: 10   Highest education level: 10th grade  Occupational History   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 area    Social Determinants of Health   Financial Resource Strain: Low Risk  (  03/29/2021)   Overall Financial Resource Strain (CARDIA)    Difficulty of Paying Living Expenses: Not hard at all  Food Insecurity: No Food Insecurity (03/29/2021)   Hunger Vital Sign    Worried About Running Out of Food in the Last Year: Never true    Ran Out of Food in the Last Year: Never true  Transportation Needs: No Transportation Needs (03/29/2021)   PRAPARE - Hydrologist (Medical): No    Lack of Transportation (Non-Medical): No  Physical Activity: Insufficiently Active (03/29/2021)   Exercise Vital Sign    Days of Exercise per Week: 7 days    Minutes of Exercise per Session: 20 min  Stress: No Stress Concern Present (03/29/2021)   [place]    Feeling of Stress : Not at all  Social  Connections: Socially Isolated (03/29/2021)   Social Connection and Isolation Panel [NHANES]    Frequency of Communication with Friends and Family: More than three times a week    Frequency of Social Gatherings with Friends and Family: Once a week    Attends Religious Services: Never    Marine scientist or Organizations: No    Attends Archivist Meetings: Never    Marital Status: Divorced  Human resources officer Violence: Not At Risk (03/29/2021)   Humiliation, Afraid, Rape, and Kick questionnaire    Fear of Current or Ex-Partner: No    Emotionally Abused: No    Physically Abused: No    Sexually Abused: No    FAMILY HISTORY:  Family History  Problem Relation Age of Onset   Breast cancer Mother    Thyroid disease Mother    Heart disease Mother    Heart attack Father    Heart attack Sister    Hypertension Sister    Heart attack Brother    Cancer Sister    Stroke Brother    Alzheimer's disease Maternal Aunt     CURRENT MEDICATIONS:  Current Outpatient Medications  Medication Sig Dispense Refill   Accu-Chek Softclix Lancets lancets USE 1  TO CHECK GLUCOSE UP TO 4 TIMES DAILY 100 each 0   amLODipine (NORVASC) 5 MG tablet Take 1 tablet by mouth once daily 30 tablet 0   anastrozole (ARIMIDEX) 1 MG tablet Take 1 tablet by mouth once daily 90 tablet 0   apixaban (ELIQUIS) 5 MG TABS tablet Take 1 tablet (5 mg total) by mouth 2 (two) times daily. 60 tablet 5   blood glucose meter kit and supplies Dispense based on patient and insurance preference. Use up to four times daily as directed. (FOR ICD-10 E10.9, E11.9). 1 each 0   calcium carbonate (OS-CAL) 600 MG TABS tablet Take 600 mg by mouth daily with breakfast.     cholecalciferol (VITAMIN D3) 25 MCG (1000 UT) tablet Take 1,000 Units by mouth 2 (two) times daily.      Cyanocobalamin (VITAMIN B 12 PO) Take 1 tablet by mouth daily.     glucose blood (ACCU-CHEK GUIDE) test strip USE 1 STRIP TO CHECK GLUCOSE THREE TIMES DAILY IN   THE  MORNING, NOON,  IN  THE  EVENING  AT  BEDTIME  AS  DIRECTED 100 each 0   hydrochlorothiazide (HYDRODIURIL) 25 MG tablet Take 1 tablet by mouth once daily 90 tablet 0   metFORMIN (GLUCOPHAGE) 1000 MG tablet Take 1 tablet (1,000 mg total) by mouth daily with breakfast. 90 tablet 1   metoprolol tartrate (LOPRESSOR)  50 MG tablet Take 1 tablet by mouth twice daily 180 tablet 0   Omega-3 Fatty Acids (FISH OIL) 1000 MG CAPS Take 1 capsule by mouth 2 (two) times daily.     ondansetron (ZOFRAN) 4 MG tablet Take 1 tablet (4 mg total) by mouth every 8 (eight) hours as needed for nausea or vomiting. 30 tablet 2   rosuvastatin (CRESTOR) 5 MG tablet Take 1 tablet (5 mg total) by mouth daily. 90 tablet 3   Semaglutide (RYBELSUS) 3 MG TABS Take 3 mg by mouth daily. 90 tablet 1   No current facility-administered medications for this visit.    ALLERGIES:  No Known Allergies  PHYSICAL EXAM:  Performance status (ECOG): 1 - Symptomatic but completely ambulatory  There were no vitals filed for this visit. Wt Readings from Last 3 Encounters:  12/08/21 158 lb 9.6 oz (71.9 kg)  09/23/21 157 lb (71.2 kg)  07/18/21 157 lb (71.2 kg)   Physical Exam Constitutional:      Appearance: Normal appearance.  HENT:     Head: Normocephalic and atraumatic.     Mouth/Throat:     Mouth: Mucous membranes are moist.  Eyes:     Extraocular Movements: Extraocular movements intact.     Pupils: Pupils are equal, round, and reactive to light.  Cardiovascular:     Rate and Rhythm: Normal rate and regular rhythm.     Pulses: Normal pulses.     Heart sounds: Normal heart sounds.  Pulmonary:     Effort: Pulmonary effort is normal.     Breath sounds: Normal breath sounds.  Chest:       Comments: No discrete nodules or masses in right breast.  Left mastectomy site within normal limits.  No axillary, supraclavicular, pectoral, or epitrochlear lymphadenopathy. Abdominal:     General: Abdomen is protuberant. Bowel sounds  are normal. There is distension.     Palpations: Abdomen is soft.     Tenderness: There is no abdominal tenderness.  Musculoskeletal:        General: No swelling.     Right lower leg: No edema.     Left lower leg: No edema.  Lymphadenopathy:     Cervical: No cervical adenopathy.  Skin:    General: Skin is warm and dry.  Neurological:     General: No focal deficit present.     Mental Status: She is alert and oriented to person, place, and time.  Psychiatric:        Mood and Affect: Mood normal.        Behavior: Behavior normal.      LABORATORY DATA:  I have reviewed the labs as listed.     Latest Ref Rng & Units 12/19/2021   12:09 PM 12/12/2021    5:04 PM 09/23/2021    2:30 PM  CBC  WBC 4.0 - 10.5 K/uL 8.8  10.1  9.9   Hemoglobin 12.0 - 15.0 g/dL 11.0  10.9  11.2   Hematocrit 36.0 - 46.0 % 32.1  32.7  31.8   Platelets 150 - 400 K/uL 199  210  204       Latest Ref Rng & Units 12/19/2021   12:09 PM 12/12/2021    5:04 PM 09/23/2021    2:30 PM  CMP  Glucose 70 - 99 mg/dL 322  185  194   BUN 8 - 23 mg/dL 17  13  36   Creatinine 0.44 - 1.00 mg/dL 1.02  0.86  1.01   Sodium  135 - 145 mmol/L 135  142  137   Potassium 3.5 - 5.1 mmol/L 3.6  3.7  3.4   Chloride 98 - 111 mmol/L 98  100  101   CO2 22 - 32 mmol/L _0 Calcium 8.9 - 10.3 mg/dL 8.7 - 10.3 mg/dL 10.0    10.1  9.5  9.8   Total Protein 6.5 - 8.1 g/dL 8.5  7.9    Total Bilirubin 0.3 - 1.2 mg/dL 0.7  0.5    Alkaline Phos 38 - 126 U/L 48  61    AST 15 - 41 U/L 70  54    ALT 0 - 44 U/L 37  34      DIAGNOSTIC IMAGING:  I have independently reviewed the scans and discussed with the patient. No results found.   ASSESSMENT & PLAN: 1.  Poorly differentiated left-sided breast cancer, stage IIIb - On 06/12/2017 she had a left breast biopsy with sarcomatoid features, left axillary lymph node biopsy was negative. - Her 2D echo showed EF 55 to 60% - CT of the chest showed subcarinal adenopathy. - PET CT scan was  negative for metastatic disease. -She had 4 cycles of neoadjuvant dose dense AC from 07/18/2017 through 09/03/2017. - She had 12 cycles of weekly paclitaxel from 09/17/2017 through 12/06/2017. -She underwent a left modified radical mastectomy on 12/31/2017. - The pathology revealed a 2.3 cm poorly differentiated invasive ductal carcinoma with sarcomatoid changes, resection margins negative, high-grade DCIS, no skin involvement, 0 out of 8 lymph nodes positive, negative for lymphovascular or perineural invasion.  ER positive, PR and HER-2 negative.  Pathological staging is [id number]. - Her receptors were rechecked on a mastectomy specimen and HER-2 was positive by IHC.  This was initially negative by [place] on biopsy specimen.  ER was negative on mastectomy specimen.  It was 60% positive on biopsy specimen.  Ki 67 has decreased from 30% to 80% on biopsy.  This was reviewed by a second pathologist and confirmed. - It was recommended she have 1 year of Herceptin. Pertuzumab was not added secondary to lymph node negativity.  - 1 year of Herceptin completed on 03/04/2019. - Anastrozole started on 02/27/2018. - Mammogram on 06/15/2021 of the right breast was BI-RADS Category 1. - Physical exam today: No discrete nodules or masses in right breast.  Left mastectomy site within normal limits.  No axillary, supraclavicular, pectoral, or epitrochlear lymphadenopathy. - Most recent labs (12/19/2021): AST remains elevated around 70, stable.  Other LFTs normal.  CMP otherwise unremarkable.  CBC grossly unremarkable. - No red flag symptoms of recurrence per ROS. - PLAN: Continue anastrozole daily. - Repeat labs and RTC in 6 months.   - Next mammogram due around 06/16/2022 - We will obtain BCI testing to determine length of aromatase inhibitor treatment.  2.  Osteopenia - DEXA (03/07/2018) showed T score -1.8 - DEXA (06/18/2020) showed T score -1.8, osteopenic - She takes vitamin D 1000 units twice daily and calcium 600 mg  daily  - Labs (12/19/2021) showed normal vitamin D 51.57, calcium 10.0 - PLAN: Check next DEXA around 06/19/2022 - Continue to recommend calcium, vitamin D, and weightbearing exercises for breast cancer therapy associated bone loss  3.  Mild hypercalcemia - She had mildly elevated calcium level ranging from 10.4-11 since August 2022.  This could be secondary to vitamin D intake.  - Most recent labs (12/19/2021): Calcium 10.0 per CMP.  Vitamin D 51.57.  Normal PTH 29, normal  calcium 10.1 per intact PTH/calcium.    PLAN SUMMARY & DISPOSITION: Mammogram + DEXA/bone density around 06/20/2022 Labs in March 2024 Office visit 1 week after imaging studies and labs  All questions were answered. The patient knows to call the clinic with any problems, questions or concerns.  Medical decision making: Moderate  Time spent on visit: I spent 20 minutes counseling the patient face to face. The total time spent in the appointment was 30 minutes and more than 50% was on counseling.   Harriett Rush, PA-C  12/27/21 11:13 AM

## 2021-12-27 ENCOUNTER — Other Ambulatory Visit (HOSPITAL_COMMUNITY): Payer: Self-pay | Admitting: Physician Assistant

## 2021-12-27 ENCOUNTER — Inpatient Hospital Stay (HOSPITAL_BASED_OUTPATIENT_CLINIC_OR_DEPARTMENT_OTHER): Payer: Medicare Other | Admitting: Physician Assistant

## 2021-12-27 VITALS — BP 142/91 | HR 72 | Temp 97.4°F | Resp 18 | Ht 64.0 in | Wt 155.9 lb

## 2021-12-27 DIAGNOSIS — Z9012 Acquired absence of left breast and nipple: Secondary | ICD-10-CM | POA: Diagnosis not present

## 2021-12-27 DIAGNOSIS — Z79811 Long term (current) use of aromatase inhibitors: Secondary | ICD-10-CM

## 2021-12-27 DIAGNOSIS — Z170 Estrogen receptor positive status [ER+]: Secondary | ICD-10-CM | POA: Diagnosis not present

## 2021-12-27 DIAGNOSIS — C50412 Malignant neoplasm of upper-outer quadrant of left female breast: Secondary | ICD-10-CM | POA: Diagnosis not present

## 2021-12-27 DIAGNOSIS — M8580 Other specified disorders of bone density and structure, unspecified site: Secondary | ICD-10-CM | POA: Diagnosis not present

## 2021-12-27 DIAGNOSIS — R252 Cramp and spasm: Secondary | ICD-10-CM | POA: Diagnosis not present

## 2021-12-27 NOTE — Patient Instructions (Signed)
[place] [facility] [place] **   You were seen today by Tarri Abernethy PA-C for your history of breast cancer.    HISTORY OF BREAST CANCER Your labs and physical exam did not show any signs of returning breast cancer at today's visit. You are due for repeat mammogram in March 2024. Continue to take anastrozole daily to help prevent recurrent breast cancer.  OSTEOPENIA You have mild weakness in your bones, which can be a side effect of your cancer treatment. We will check another bone density scan in March 2024. Continue to take vitamin D 1000 units twice daily and calcium 600 mg once daily.  FOLLOW-UP APPOINTMENT: Office visit in 6 months, after mammogram, bone density scan, and labs.  ** Thank you for trusting me with your healthcare!  I strive to provide all of my patients with quality care at each visit.  If you receive a survey for this visit, I would be so grateful to you for taking the time to provide feedback.  Thank you in advance!  ~ Kanon Colunga                   Dr. Derek Jack   &   Tarri Abernethy, PA-C   - - - - - - - - - - - - - - - - - -    Thank you for choosing [facility] to provide your oncology and hematology care.  To afford each patient quality time with our provider, please arrive at least 15 minutes before your scheduled appointment time.   If you have a lab appointment with the [facility] please come in thru the Main Entrance and check in at the main information desk.  You need to re-schedule your appointment should you arrive 10 or more minutes late.  We strive to give you quality time with our providers, and arriving late affects you and other patients whose appointments are after yours.  Also, if you no show three or more times for appointments you may be dismissed from the clinic at the providers discretion.     Again, thank you for choosing [facility].  Our hope is that these requests will decrease the amount of time that you wait before being seen by our physicians.       _____________________________________________________________  Should you have questions after your visit to [facility], please contact our office at [phone] and follow the prompts.  Our office hours are 8:00 a.m. and 4:30 p.m. Monday - Friday.  Please note that voicemails left after 4:00 p.m. may not be returned until the following business day.  We are closed weekends and major holidays.  You do have access to a nurse 24-7, just call the main number to the clinic [phone] and do not press any options, hold on the line and a nurse will answer the phone.    For prescription refill requests, have your pharmacy contact our office and allow 72 hours.

## 2022-01-04 ENCOUNTER — Other Ambulatory Visit: Payer: Self-pay | Admitting: Internal Medicine

## 2022-01-04 DIAGNOSIS — I10 Essential (primary) hypertension: Secondary | ICD-10-CM

## 2022-01-16 ENCOUNTER — Other Ambulatory Visit (HOSPITAL_COMMUNITY): Payer: Self-pay | Admitting: Hematology

## 2022-01-16 DIAGNOSIS — C50412 Malignant neoplasm of upper-outer quadrant of left female breast: Secondary | ICD-10-CM

## 2022-01-16 DIAGNOSIS — Z170 Estrogen receptor positive status [ER+]: Secondary | ICD-10-CM

## 2022-01-18 ENCOUNTER — Encounter (HOSPITAL_COMMUNITY): Payer: Self-pay | Admitting: Hematology

## 2022-01-29 NOTE — Progress Notes (Deleted)
Cardiology Office Note:    Date:  01/29/2022   ID:  Lisa Thornton, DOB 05/05/1955, MRN [id number]  PCP:  Lindell Spar, MD  Cardiologist:  Kate Sable, MD (Inactive)  Electrophysiologist:  None   Referring MD: Lindell Spar, MD   No chief complaint on file. ***  History of Present Illness:    Lisa Thornton is a 66 y.o. female with a hx of breast cancer, hypertension, and diabetes who is referred by Dr. Posey Pronto for evaluation of atrial fibrillation.  Echocardiogram 01/14/2019 showed normal biventricular function, no significant valvular disease.  Past Medical History:  Diagnosis Date   Anemia 12/31/2017   Cancer ([place])    left breast cancer   Chest pain    Diabetes mellitus without complication ([place])    Hypertension     Past Surgical History:  Procedure Laterality Date   COLONOSCOPY WITH PROPOFOL N/A 09/27/2021   Procedure: COLONOSCOPY WITH PROPOFOL;  Surgeon: Harvel Quale, MD;  [facility];  Service: Gastroenterology;  Laterality: N/A;  945   MASTECTOMY MODIFIED RADICAL Left 12/31/2017   Procedure: LEFT MODIFIED RADICAL MASTECTOMY;  Surgeon: Aviva Signs, MD;  Location: [facility];  Service: General;  Laterality: Left;   POLYPECTOMY  09/27/2021   Procedure: POLYPECTOMY;  Surgeon: Harvel Quale, MD;  [facility];  Service: Gastroenterology;;   PORTACATH PLACEMENT Right 06/27/2017   Procedure: INSERTION PORT-A-CATH;  Surgeon: Aviva Signs, MD;  Location: [facility];  Service: General;  Laterality: Right;    Current Medications: No outpatient medications have been marked as taking for the 01/31/22 encounter (Appointment) with Donato Heinz, MD.     Allergies:   Patient has no known allergies.   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  Tobacco Use   Smoking status: Never   Smokeless tobacco: Never  Vaping Use   Vaping Use: Never used  Substance and Sexual Activity   Alcohol use: No   Drug use: No   Sexual activity: Not Currently  Other Topics Concern   Not on file  Social History Narrative   Lives with sister   2 children   Dog: Cozie      Enjoys: puzzles, sewing, and walk      Diet: eats all food groups outside: leafy greens    Caffeine: limited, sweet tea   Water: 6-8 cups daily       No car; does have license, wears seat belt   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   [place] area    Social Determinants of Health   Financial Resource Strain: Low Risk  (03/29/2021)   Overall Financial Resource Strain (CARDIA)    Difficulty of Paying Living Expenses: Not hard at all  Food Insecurity: No Food Insecurity (03/29/2021)   Hunger Vital Sign    Worried About Running Out of Food in the Last Year: Never true    [place] in the Last Year: Never true  Transportation Needs: No Transportation Needs (03/29/2021)   PRAPARE - Hydrologist (Medical): No    Lack of Transportation (Non-Medical): No  Physical Activity: Insufficiently Active (03/29/2021)   Exercise Vital Sign    Days of Exercise per Week: 7 days    Minutes  of Exercise per Session: 20 min  Stress: No Stress Concern Present (03/29/2021)   [place]    Feeling of Stress : Not at all  Social Connections: Socially Isolated (03/29/2021)   Social Connection and Isolation Panel [NHANES]    Frequency of Communication with Friends and Family: More than three times a week    Frequency of Social Gatherings with Friends and Family: Once a week    Attends Religious Services: Never    Marine scientist or Organizations: No    Attends Music therapist: Never    Marital Status: Divorced     Family History: The patient's ***family history  includes Alzheimer's disease in her maternal aunt; Breast cancer in her mother; Cancer in her sister; Heart attack in her brother, father, and sister; Heart disease in her mother; Hypertension in her sister; Stroke in her brother; Thyroid disease in her mother.  ROS:   Please see the history of present illness.    *** All other systems reviewed and are negative.  EKGs/Labs/Other Studies Reviewed:    The following studies were reviewed today: ***  EKG:  EKG is *** ordered today.  The ekg ordered today demonstrates ***  Recent Labs: 12/12/2021: TSH 1.480 12/19/2021: ALT 37; BUN 17; Creatinine, Ser 1.02; Hemoglobin 11.0; Platelets 199; Potassium 3.6; Sodium 135  Recent Lipid Panel    Component Value Date/Time   CHOL 212 (H) 12/12/2021 1704   TRIG 203 (H) 12/12/2021 1704   HDL 39 (L) 12/12/2021 1704   CHOLHDL 5.4 (H) 12/12/2021 1704   LDLCALC 136 (H) 12/12/2021 1704    Physical Exam:    VS:  There were no vitals taken for this visit.    Wt Readings from Last 3 Encounters:  12/27/21 155 lb 14.4 oz (70.7 kg)  12/08/21 158 lb 9.6 oz (71.9 kg)  09/23/21 157 lb (71.2 kg)     GEN: *** Well nourished, well developed in no acute distress HEENT: Normal NECK: No JVD; No carotid bruits LYMPHATICS: No lymphadenopathy CARDIAC: ***RRR, no murmurs, rubs, gallops RESPIRATORY:  Clear to auscultation without rales, wheezing or rhonchi  ABDOMEN: Soft, non-tender, non-distended MUSCULOSKELETAL:  No edema; No deformity  SKIN: Warm and dry NEUROLOGIC:  Alert and oriented x 3 PSYCHIATRIC:  Normal affect   ASSESSMENT:    No diagnosis found. PLAN:    Atrial fibrillation: CHA2DS2-VASc score 4 (hypertension, age, diabetes, female) -Continue Eliquis 5 mg twice daily -Continue metoprolol 50 mg twice daily  Hypertension: On amlodipine 5 mg daily and metoprolol 50 mg twice daily  Hyperlipidemia: On rosuvastatin 5 mg daily  T2DM: On metformin  RTC in***   Medication Adjustments/Labs and  Tests Ordered: Current medicines are reviewed at length with the patient today.  Concerns regarding medicines are outlined above.  No orders of the defined types were placed in this encounter.  No orders of the defined types were placed in this encounter.   There are no Patient Instructions on file for this visit.   Signed, Donato Heinz, MD  01/29/2022 11:09 PM    [place]

## 2022-01-31 ENCOUNTER — Ambulatory Visit: Payer: Medicare Other | Admitting: Cardiology

## 2022-02-01 ENCOUNTER — Other Ambulatory Visit: Payer: Self-pay | Admitting: Internal Medicine

## 2022-02-01 DIAGNOSIS — I4891 Unspecified atrial fibrillation: Secondary | ICD-10-CM

## 2022-02-01 DIAGNOSIS — I10 Essential (primary) hypertension: Secondary | ICD-10-CM

## 2022-02-13 ENCOUNTER — Encounter: Payer: Self-pay | Admitting: Internal Medicine

## 2022-02-13 ENCOUNTER — Ambulatory Visit: Payer: Medicare Other | Attending: Cardiology | Admitting: Internal Medicine

## 2022-02-13 VITALS — BP 132/76 | HR 84 | Ht 64.0 in | Wt 156.0 lb

## 2022-02-13 DIAGNOSIS — I480 Paroxysmal atrial fibrillation: Secondary | ICD-10-CM | POA: Diagnosis not present

## 2022-02-13 DIAGNOSIS — I10 Essential (primary) hypertension: Secondary | ICD-10-CM | POA: Diagnosis not present

## 2022-02-13 NOTE — Patient Instructions (Signed)
Medication Instructions:  Your physician recommends that you continue on your current medications as directed. Please refer to the Current Medication list given to you today.   Labwork: None  Testing/Procedures: None  Follow-Up: Follow up as needed with Dr. Dellia Cloud.   Any Other Special Instructions Will Be Listed Below (If Applicable).     If you need a refill on your cardiac medications before your next appointment, please call your pharmacy.

## 2022-02-13 NOTE — Progress Notes (Signed)
Cardiology Office Note  Date: 02/13/2022   ID: Lisa Thornton, DOB 1955/04/15, MRN [id number]  PCP:  Lindell Spar, MD  Cardiologist:  Kate Sable, MD (Inactive) Electrophysiologist:  None   Reason for Office Visit: Evaluation of A-fib at the request of Dr. Posey Pronto   History of Present Illness: Lisa Thornton is a 66 y.o. female known to have HTN, DM 2 was referred to cardiology clinic for evaluation of A-fib at the request of Dr. Posey Pronto. Patient had evidence of A-fib on the EKG performed in April 2023.  Currently on metoprolol and Eliquis.  No symptoms of sleep apnea.  She denied any angina, DOE, lightness, dizziness, syncope, claudication, LE swelling. Denies smoking cigarettes, alcohol use or illicit drug abuse. No prior ischemic evaluation. No prior MI/CABG/PCI's.  Past Medical History:  Diagnosis Date   Anemia 12/31/2017   Cancer ([place])    left breast cancer   Chest pain    Diabetes mellitus without complication ([place])    Hypertension     Past Surgical History:  Procedure Laterality Date   COLONOSCOPY WITH PROPOFOL N/A 09/27/2021   Procedure: COLONOSCOPY WITH PROPOFOL;  Surgeon: Harvel Quale, MD;  [facility];  Service: Gastroenterology;  Laterality: N/A;  945   MASTECTOMY MODIFIED RADICAL Left 12/31/2017   Procedure: LEFT MODIFIED RADICAL MASTECTOMY;  Surgeon: Aviva Signs, MD;  Location: [facility];  Service: General;  Laterality: Left;   POLYPECTOMY  09/27/2021   Procedure: POLYPECTOMY;  Surgeon: Montez Morita, Quillian Quince, MD;  [facility];  Service: Gastroenterology;;   PORTACATH PLACEMENT Right 06/27/2017   Procedure: INSERTION PORT-A-CATH;  Surgeon: Aviva Signs, MD;  Location: [facility];  Service: General;  Laterality: Right;    Current Outpatient Medications  Medication Sig Dispense Refill   Accu-Chek Softclix Lancets lancets USE 1  TO CHECK GLUCOSE UP TO 4 TIMES DAILY 100 each 0   amLODipine (NORVASC) 5 MG tablet Take 1 tablet by  mouth once daily 30 tablet 0   anastrozole (ARIMIDEX) 1 MG tablet Take 1 tablet by mouth once daily 90 tablet 0   blood glucose meter kit and supplies Dispense based on patient and insurance preference. Use up to four times daily as directed. (FOR ICD-10 E10.9, E11.9). 1 each 0   calcium carbonate (OS-CAL) 600 MG TABS tablet Take 600 mg by mouth daily with breakfast.     cholecalciferol (VITAMIN D3) 25 MCG (1000 UT) tablet Take 1,000 Units by mouth 2 (two) times daily.      ELIQUIS 5 MG TABS tablet Take 1 tablet by mouth twice daily 180 tablet 0   glucose blood (ACCU-CHEK GUIDE) test strip USE 1 STRIP TO CHECK GLUCOSE THREE TIMES DAILY IN THE MORNING, NOON, AND AT BEDTIME AS DIRECTED 100 each 0   hydrochlorothiazide (HYDRODIURIL) 25 MG tablet Take 1 tablet by mouth once daily 90 tablet 0   metFORMIN (GLUCOPHAGE) 1000 MG tablet Take 1 tablet (1,000 mg total) by mouth daily with breakfast. 90 tablet 1   metoprolol tartrate (LOPRESSOR) 50 MG tablet Take 1 tablet by mouth twice daily 180 tablet 0   Omega-3 Fatty Acids (FISH OIL) 1000 MG CAPS Take 1 capsule by mouth 2 (two) times daily.     ondansetron (ZOFRAN) 4 MG tablet Take 1 tablet (4 mg total) by mouth every 8 (eight) hours as needed for nausea or vomiting. 30 tablet 2   rosuvastatin (CRESTOR) 5 MG tablet Take 1 tablet (5 mg total) by mouth daily. 90 tablet  3   Semaglutide (RYBELSUS) 3 MG TABS Take 3 mg by mouth daily. 90 tablet 1   No current facility-administered medications for this visit.   Allergies:  Patient has no known allergies.   Social History: The patient  reports that she has never smoked. She has never used smokeless tobacco. She reports that she does not drink alcohol and does not use drugs.   Family History: The patient's family history includes Alzheimer's disease in her maternal aunt; Breast cancer in her mother; Cancer in her sister; Heart attack in her brother, father, and sister; Heart disease in her mother; Hypertension in  her sister; Stroke in her brother; Thyroid disease in her mother.   ROS:  Please see the history of present illness. Otherwise, complete review of systems is positive for none.  All other systems are reviewed and negative.   Physical Exam: VS:  BP 132/76   Pulse 84   Ht _0  (1.626 m)   Wt 156 lb (70.8 kg)   SpO2 97%   BMI 26.78 kg/m , BMI Body mass index is 26.78 kg/m.  Wt Readings from Last 3 Encounters:  02/13/22 156 lb (70.8 kg)  12/27/21 155 lb 14.4 oz (70.7 kg)  12/08/21 158 lb 9.6 oz (71.9 kg)    General: Patient appears comfortable at rest. HEENT: Conjunctiva and lids normal, oropharynx clear with moist mucosa. Neck: Supple, no elevated JVP or carotid bruits, no thyromegaly. Lungs: Clear to auscultation, nonlabored breathing at rest. Cardiac: Regular rate and rhythm, no S3 or significant systolic murmur, no pericardial rub. Abdomen: Soft, nontender, no hepatomegaly, bowel sounds present, no guarding or rebound. Extremities: No pitting edema, distal pulses 2+. Skin: Warm and dry. Musculoskeletal: No kyphosis. Neuropsychiatric: Alert and oriented x3, affect grossly appropriate.  Recent Labwork: 12/12/2021: TSH 1.480 12/19/2021: ALT 37; AST 70; BUN 17; Creatinine, Ser 1.02; Hemoglobin 11.0; Platelets 199; Potassium 3.6; Sodium 135     Component Value Date/Time   CHOL 212 (H) 12/12/2021 1704   TRIG 203 (H) 12/12/2021 1704   HDL 39 (L) 12/12/2021 1704   CHOLHDL 5.4 (H) 12/12/2021 1704   LDLCALC 136 (H) 12/12/2021 1704    Other Studies Reviewed Today: Echo from 2020 LVEF preserved  Assessment and Plan: Patient is a 66 year old F known to have HTN, DM 2 was referred to cardiology clinic for evaluation of A-fib.  #A-fib -Continue metoprolol tartrate 50 mg twice daily -Continue Eliquis 5 mg twice daily -Nurse visit for EKG  #HTN, controlled -Continue amlodipine 5 mg once daily  I have spent a total of 45 minutes with patient reviewing chart ,EKGs, labs and  examining patient as well as establishing an assessment and plan that was discussed with the patient.  > 50% of time was spent in direct patient care.     Medication Adjustments/Labs and Tests Ordered: Current medicines are reviewed at length with the patient today.  Concerns regarding medicines are outlined above.   Tests Ordered: No orders of the defined types were placed in this encounter.   Medication Changes: No orders of the defined types were placed in this encounter.   Disposition:  Follow up prn  Signed, Braylen Denunzio Fidel Levy, MD, 02/13/2022 4:16 PM    [facility] [place] [address], [place], [place] [address]

## 2022-02-15 ENCOUNTER — Telehealth: Payer: Self-pay | Admitting: Internal Medicine

## 2022-02-15 ENCOUNTER — Ambulatory Visit: Payer: Medicare Other | Attending: Cardiology | Admitting: *Deleted

## 2022-02-15 ENCOUNTER — Ambulatory Visit: Payer: Medicare Other

## 2022-02-15 DIAGNOSIS — I480 Paroxysmal atrial fibrillation: Secondary | ICD-10-CM | POA: Diagnosis not present

## 2022-02-15 NOTE — Telephone Encounter (Signed)
Patient will come today, 11/15, at 3:45 pm for EKG.

## 2022-02-15 NOTE — Telephone Encounter (Signed)
Pt called in because she missed her nurses visit today and needs to r/s

## 2022-02-16 ENCOUNTER — Other Ambulatory Visit: Payer: Self-pay | Admitting: Internal Medicine

## 2022-02-16 DIAGNOSIS — E1165 Type 2 diabetes mellitus with hyperglycemia: Secondary | ICD-10-CM

## 2022-03-11 ENCOUNTER — Other Ambulatory Visit: Payer: Self-pay | Admitting: Internal Medicine

## 2022-03-11 DIAGNOSIS — I10 Essential (primary) hypertension: Secondary | ICD-10-CM

## 2022-04-11 ENCOUNTER — Encounter: Payer: Self-pay | Admitting: Internal Medicine

## 2022-04-11 ENCOUNTER — Ambulatory Visit: Payer: Medicare Other | Admitting: Internal Medicine

## 2022-04-17 ENCOUNTER — Other Ambulatory Visit: Payer: Self-pay | Admitting: Internal Medicine

## 2022-04-17 ENCOUNTER — Other Ambulatory Visit: Payer: Self-pay | Admitting: *Deleted

## 2022-04-17 ENCOUNTER — Other Ambulatory Visit (HOSPITAL_COMMUNITY): Payer: Self-pay | Admitting: Hematology

## 2022-04-17 DIAGNOSIS — Z170 Estrogen receptor positive status [ER+]: Secondary | ICD-10-CM

## 2022-04-17 DIAGNOSIS — E1165 Type 2 diabetes mellitus with hyperglycemia: Secondary | ICD-10-CM

## 2022-04-17 DIAGNOSIS — I10 Essential (primary) hypertension: Secondary | ICD-10-CM

## 2022-04-17 NOTE — Telephone Encounter (Signed)
Refill approved for Anastrozole.  Patient tolerating and is to continue therapy.

## 2022-04-24 ENCOUNTER — Other Ambulatory Visit: Payer: Self-pay | Admitting: Internal Medicine

## 2022-04-24 DIAGNOSIS — I10 Essential (primary) hypertension: Secondary | ICD-10-CM

## 2022-04-26 ENCOUNTER — Encounter: Payer: Self-pay | Admitting: Internal Medicine

## 2022-04-26 ENCOUNTER — Ambulatory Visit (INDEPENDENT_AMBULATORY_CARE_PROVIDER_SITE_OTHER): Payer: 59 | Admitting: Internal Medicine

## 2022-04-26 VITALS — BP 131/80 | HR 90 | Ht 64.0 in | Wt 151.0 lb

## 2022-04-26 DIAGNOSIS — N3000 Acute cystitis without hematuria: Secondary | ICD-10-CM | POA: Diagnosis not present

## 2022-04-26 DIAGNOSIS — E1140 Type 2 diabetes mellitus with diabetic neuropathy, unspecified: Secondary | ICD-10-CM | POA: Diagnosis not present

## 2022-04-26 DIAGNOSIS — I480 Paroxysmal atrial fibrillation: Secondary | ICD-10-CM | POA: Diagnosis not present

## 2022-04-26 DIAGNOSIS — I4891 Unspecified atrial fibrillation: Secondary | ICD-10-CM

## 2022-04-26 DIAGNOSIS — I10 Essential (primary) hypertension: Secondary | ICD-10-CM

## 2022-04-26 DIAGNOSIS — Z7901 Long term (current) use of anticoagulants: Secondary | ICD-10-CM

## 2022-04-26 DIAGNOSIS — Z23 Encounter for immunization: Secondary | ICD-10-CM | POA: Diagnosis not present

## 2022-04-26 MED ORDER — RYBELSUS 7 MG PO TABS: 7.0000 mg | ORAL_TABLET | Freq: Every day | ORAL | 1 refills | Status: DC

## 2022-04-26 MED ORDER — APIXABAN 5 MG PO TABS: 5.0000 mg | ORAL_TABLET | Freq: Two times a day (BID) | ORAL | 3 refills | Status: DC

## 2022-04-26 MED ORDER — SULFAMETHOXAZOLE-TRIMETHOPRIM 800-160 MG PO TABS: 1.0000 | ORAL_TABLET | Freq: Two times a day (BID) | ORAL | 0 refills | Status: DC

## 2022-04-26 MED ORDER — AMLODIPINE BESYLATE 5 MG PO TABS: 5.0000 mg | ORAL_TABLET | Freq: Every day | ORAL | 3 refills | Status: DC

## 2022-04-26 NOTE — Assessment & Plan Note (Signed)
BP Readings from Last 1 Encounters:  04/26/22 131/80   Well-controlled with amlodipine, HCTZ and metoprolol Counseled for compliance with the medications Advised DASH diet and moderate exercise/walking, at least 150 mins/week

## 2022-04-26 NOTE — Progress Notes (Signed)
Established Patient Office Visit  Subjective:  Patient ID: Lisa Thornton, female    DOB: [birth year]  Age: 67 y.o. MRN: [id number]  CC:  Chief Complaint  Patient presents with   Hypertension    Four month follow up on hypertension and diabetes. Patient is unsure if she is having a UTI or not    HPI Lisa Thornton is a 67 y.o. female with past medical history of HTN, type 2 DM and breast ca s/p mastectomy and chemotherapy who presents for f/u of her chronic medical conditions.  She still complains of dysuria and urinary frequency for the last 2 weeks.  She was recently treated with Macrobid for UTI, after which her symptoms had improved for few days.  She denies any fever, chills, nausea or vomiting currently.   Type II DM: Her last HbA1c was 7.8. Her blood glucose has been around 120-140 most of the time. Denies any polyuria or polyphagia. Denies any dysuria or hematuria.   HTN: BP is well-controlled. Takes medications regularly. Patient denies headache, dizziness, chest pain, dyspnea or palpitations.   Atrial fibrillation: She was found to have atrial fibrillation in the last year.  She was placed on Eliquis and was referred to cardiology. She denies any dizziness or palpitations currently.  Denies any active bleeding.    Past Medical History:  Diagnosis Date   Anemia 12/31/2017   Cancer ([place])    left breast cancer   Chest pain    Diabetes mellitus without complication ([place])    Hypertension     Past Surgical History:  Procedure Laterality Date   COLONOSCOPY WITH PROPOFOL N/A 09/27/2021   Procedure: COLONOSCOPY WITH PROPOFOL;  Surgeon: Harvel Quale, MD;  Location: [facility];  Service: Gastroenterology;  Laterality: N/A;  945   MASTECTOMY MODIFIED RADICAL Left 12/31/2017   Procedure: LEFT MODIFIED RADICAL MASTECTOMY;  Surgeon: Aviva Signs, MD;  Location: [facility];  Service: General;  Laterality: Left;   POLYPECTOMY  09/27/2021   Procedure: POLYPECTOMY;  Surgeon:  Harvel Quale, MD;  Location: [facility];  Service: Gastroenterology;;   PORTACATH PLACEMENT Right 06/27/2017   Procedure: INSERTION PORT-A-CATH;  Surgeon: Aviva Signs, MD;  Location: [facility];  Service: General;  Laterality: Right;    Family History  Problem Relation Age of Onset   Breast cancer Mother    Thyroid disease Mother    Heart disease Mother    Heart attack Father    Heart attack Sister    Hypertension Sister    Heart attack Brother    Cancer Sister    Stroke Brother    Alzheimer's disease Maternal Aunt     Social History   Socioeconomic History   Marital status: Divorced    Spouse name: Not on file   Number of children: 2   Years of education: 10   Highest education level: 10th grade  Occupational History   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  Tobacco Use   Smoking status: Never   Smokeless tobacco: Never  Vaping Use   Vaping Use: Never used  Substance and Sexual Activity   Alcohol use: No   Drug use: No   Sexual activity: Not Currently  Other Topics Concern   Not on file  Social History Narrative   Lives with sister   2 children   Dog: Cozie      Enjoys: puzzles, sewing, and walk      Diet: eats all food groups outside: leafy greens    Caffeine:  limited, sweet tea   Water: 6-8 cups daily       No car; does have license, wears seat belt   Smoke detector at home   [place] area    Social Determinants of Health   Financial Resource Strain: Low Risk  (03/29/2021)   Overall Financial Resource Strain (CARDIA)    Difficulty of Paying Living Expenses: Not hard at all  Food Insecurity: No Food Insecurity (03/29/2021)   Hunger Vital Sign    Worried About Running Out of Food in the Last Year: Never true    Ran Out of Food in the Last Year: Never true  Transportation Needs: No Transportation Needs (03/29/2021)   PRAPARE - Hydrologist (Medical): No    Lack of Transportation (Non-Medical): No  Physical Activity:  Insufficiently Active (03/29/2021)   Exercise Vital Sign    Days of Exercise per Week: 7 days    Minutes of Exercise per Session: 20 min  Stress: No Stress Concern Present (03/29/2021)   [place]    Feeling of Stress : Not at all  Social Connections: Socially Isolated (03/29/2021)   Social Connection and Isolation Panel [NHANES]    Frequency of Communication with Friends and Family: More than three times a week    Frequency of Social Gatherings with Friends and Family: Once a week    Attends Religious Services: Never    Marine scientist or Organizations: No    Attends Archivist Meetings: Never    Marital Status: Divorced  Human resources officer Violence: Not At Risk (03/29/2021)   Humiliation, Afraid, Rape, and Kick questionnaire    Fear of Current or Ex-Partner: No    Emotionally Abused: No    Physically Abused: No    Sexually Abused: No    Outpatient Medications Prior to Visit  Medication Sig Dispense Refill   Accu-Chek Softclix Lancets lancets USE 1  TO CHECK GLUCOSE UP TO 4 TIMES DAILY 100 each 0   anastrozole (ARIMIDEX) 1 MG tablet Take 1 tablet by mouth once daily 90 tablet 0   blood glucose meter kit and supplies Dispense based on patient and insurance preference. Use up to four times daily as directed. (FOR ICD-10 E10.9, E11.9). 1 each 0   calcium carbonate (OS-CAL) 600 MG TABS tablet Take 600 mg by mouth daily with breakfast.     cholecalciferol (VITAMIN D3) 25 MCG (1000 UT) tablet Take 1,000 Units by mouth 2 (two) times daily.      glucose blood (ACCU-CHEK GUIDE) test strip USE 1 STRIP TO CHECK GLUCOSE THREE TIMES DAILY IN THE MORNING, NOON, AND AT BEDTIME AS DIRECTED 100 each 0   hydrochlorothiazide (HYDRODIURIL) 25 MG tablet Take 1 tablet by mouth once daily 90 tablet 0   metFORMIN (GLUCOPHAGE) 1000 MG tablet TAKE 1 TABLET BY MOUTH BEFORE BREAKFAST 90 tablet 0   metoprolol tartrate (LOPRESSOR)  50 MG tablet Take 1 tablet by mouth twice daily 180 tablet 0   Omega-3 Fatty Acids (FISH OIL) 1000 MG CAPS Take 1 capsule by mouth 2 (two) times daily.     ondansetron (ZOFRAN) 4 MG tablet Take 1 tablet (4 mg total) by mouth every 8 (eight) hours as needed for nausea or vomiting. 30 tablet 2   rosuvastatin (CRESTOR) 5 MG tablet Take 1 tablet (5 mg total) by mouth daily. 90 tablet 3   amLODipine (NORVASC) 5 MG tablet Take 1 tablet by mouth once daily  30 tablet 0   ELIQUIS 5 MG TABS tablet Take 1 tablet by mouth twice daily 180 tablet 0   RYBELSUS 3 MG TABS Take 1 tablet by mouth once daily 90 tablet 0   No facility-administered medications prior to visit.    No Known Allergies  ROS Review of Systems  Constitutional:  Positive for fatigue. Negative for chills and fever.  HENT:  Negative for congestion, sinus pressure, sinus pain and sore throat.   Eyes:  Negative for pain and discharge.  Respiratory:  Negative for cough and shortness of breath.   Cardiovascular:  Negative for chest pain and palpitations.  Gastrointestinal:  Negative for abdominal pain, diarrhea, nausea and vomiting.  Endocrine: Negative for polydipsia and polyuria.  Genitourinary:  Positive for dysuria and frequency. Negative for hematuria.  Musculoskeletal:  Negative for neck pain and neck stiffness.  Skin:  Negative for rash.  Neurological:  Negative for dizziness and weakness.  Psychiatric/Behavioral:  Negative for agitation and behavioral problems.       Objective:    Physical Exam Vitals reviewed.  Constitutional:      General: She is not in acute distress.    Appearance: She is not diaphoretic.  HENT:     Head: Normocephalic and atraumatic.     Nose: Nose normal. No congestion.     Mouth/Throat:     Mouth: Mucous membranes are moist.     Pharynx: No posterior oropharyngeal erythema.  Eyes:     General: No scleral icterus.    Extraocular Movements: Extraocular movements intact.  Neck:     Vascular:  No carotid bruit.  Cardiovascular:     Rate and Rhythm: Normal rate. Rhythm irregular.     Pulses: Normal pulses.     Heart sounds: Normal heart sounds. No murmur heard. Pulmonary:     Breath sounds: Normal breath sounds. No wheezing or rales.  Abdominal:     Palpations: Abdomen is soft.     Tenderness: There is abdominal tenderness (Mild, suprapubic).  Musculoskeletal:     Cervical back: Neck supple. No tenderness.     Right lower leg: No edema.     Left lower leg: No edema.  Skin:    General: Skin is warm.     Findings: No rash.  Neurological:     General: No focal deficit present.     Mental Status: She is alert and oriented to person, place, and time.  Psychiatric:        Mood and Affect: Mood normal.        Behavior: Behavior normal.     BP 131/80 (BP Location: Right Arm, Patient Position: Sitting, Cuff Size: Normal)   Pulse 90   Ht '5\' 4"'$  (1.626 m)   Wt 151 lb (68.5 kg)   SpO2 97%   BMI 25.92 kg/m  Wt Readings from Last 3 Encounters:  04/26/22 151 lb (68.5 kg)  02/13/22 156 lb (70.8 kg)  12/27/21 155 lb 14.4 oz (70.7 kg)    Lab Results  Component Value Date   TSH 1.480 12/12/2021   Lab Results  Component Value Date   WBC 9.8 04/26/2022   HGB 11.1 04/26/2022   HCT 33.8 (L) 04/26/2022   MCV 95 04/26/2022   PLT 210 04/26/2022   Lab Results  Component Value Date   NA 145 (H) 04/26/2022   K 3.6 04/26/2022   CO2 25 04/26/2022   GLUCOSE 181 (H) 04/26/2022   BUN 10 04/26/2022   CREATININE 0.96 04/26/2022  BILITOT 0.7 12/19/2021   ALKPHOS 48 12/19/2021   AST 70 (H) 12/19/2021   ALT 37 12/19/2021   PROT 8.5 (H) 12/19/2021   ALBUMIN 3.5 12/19/2021   CALCIUM 9.6 04/26/2022   ANIONGAP 11 12/19/2021   EGFR 64 04/26/2022   Lab Results  Component Value Date   CHOL 212 (H) 12/12/2021   Lab Results  Component Value Date   HDL 39 (L) 12/12/2021   Lab Results  Component Value Date   LDLCALC 136 (H) 12/12/2021   Lab Results  Component Value Date    TRIG 203 (H) 12/12/2021   Lab Results  Component Value Date   CHOLHDL 5.4 (H) 12/12/2021   Lab Results  Component Value Date   HGBA1C 7.8 (H) 04/26/2022      Assessment & Plan:   Problem List Items Addressed This Visit       Cardiovascular and Mediastinum   Atrial fibrillation (HCC) (Chronic)    CHADS-VASc - 4.  On Eliquis for [facility] Continue metoprolol for rate control Followed by cardiology      Relevant Medications   amLODipine (NORVASC) 5 MG tablet   apixaban (ELIQUIS) 5 MG TABS tablet   Other Relevant Orders   CBC with Differential/Platelet (Completed)   Essential hypertension    BP Readings from Last 1 Encounters:  04/26/22 131/80  Well-controlled with amlodipine, HCTZ and metoprolol Counseled for compliance with the medications Advised DASH diet and moderate exercise/walking, at least 150 mins/week      Relevant Medications   amLODipine (NORVASC) 5 MG tablet   apixaban (ELIQUIS) 5 MG TABS tablet     Endocrine   Type 2 diabetes mellitus with diabetic neuropathy, unspecified ([place]) - Primary    Lab Results  Component Value Date   HGBA1C 7.8 (H) 12/12/2021  Uncontrolled With HTN and HLD On Metformin 1000 mg QD On Rybelsus 3 mg QD, increased dose to 7 mg QD Advised to follow diabetic diet On statin F/u CMP and lipid panel Diabetic eye exam: Advised to follow up with Ophthalmology for diabetic eye exam      Relevant Medications   Semaglutide (RYBELSUS) 7 MG TABS   Other Relevant Orders   Basic Metabolic Panel (BMET) (Completed)   Hemoglobin A1c (Completed)     Genitourinary   Acute cystitis without hematuria    Check UA with reflex culture Started empiric Bactrim      Relevant Medications   sulfamethoxazole-trimethoprim (BACTRIM DS) 800-160 MG tablet   Other Relevant Orders   UA/M w/rflx Culture, Routine (Completed)   Other Visit Diagnoses     Chronic anticoagulation       Relevant Orders   CBC with Differential/Platelet (Completed)   Need  for zoster vaccination       Need for pneumococcal 20-valent conjugate vaccination       Relevant Orders   Pneumococcal conjugate vaccine 20-valent (Completed)       Meds ordered this encounter  Medications   amLODipine (NORVASC) 5 MG tablet    Sig: Take 1 tablet (5 mg total) by mouth daily.    Dispense:  90 tablet    Refill:  3   apixaban (ELIQUIS) 5 MG TABS tablet    Sig: Take 1 tablet (5 mg total) by mouth 2 (two) times daily.    Dispense:  180 tablet    Refill:  3   sulfamethoxazole-trimethoprim (BACTRIM DS) 800-160 MG tablet    Sig: Take 1 tablet by mouth 2 (two) times daily.    Dispense:  10 tablet    Refill:  0   Semaglutide (RYBELSUS) 7 MG TABS    Sig: Take 1 tablet (7 mg total) by mouth daily.    Dispense:  90 tablet    Refill:  1    Follow-up: Return in about 4 months (around 08/25/2022) for DM and HTN.    Lindell Spar, MD

## 2022-04-26 NOTE — Patient Instructions (Signed)
Please start taking Rybelsus 7 mg instead of 3 mg once daily.  Please start taking Bactrim DS for UTI.  Please continue taking other medications as prescribed.

## 2022-04-26 NOTE — Assessment & Plan Note (Addendum)
CHADS-VASc - 4.  On Eliquis for [facility] Continue metoprolol for rate control Followed by cardiology

## 2022-04-26 NOTE — Assessment & Plan Note (Addendum)
Lab Results  Component Value Date   HGBA1C 7.8 (H) 12/12/2021   Uncontrolled With HTN and HLD On Metformin 1000 mg QD On Rybelsus 3 mg QD, increased dose to 7 mg QD Advised to follow diabetic diet On statin F/u CMP and lipid panel Diabetic eye exam: Advised to follow up with Ophthalmology for diabetic eye exam

## 2022-04-28 NOTE — Assessment & Plan Note (Signed)
Check UA with reflex culture Started empiric Bactrim

## 2022-05-02 LAB — UA/M W/RFLX CULTURE, ROUTINE
Glucose, UA: NEGATIVE
Nitrite, UA: POSITIVE — AB
Specific Gravity, UA: >=1.03 — AB (ref 1.005–1.030)
Urobilinogen, Ur: 1 mg/dL (ref 0.2–1.0)
pH, UA: 5 (ref 5.0–7.5)

## 2022-05-02 LAB — CBC WITH DIFFERENTIAL/PLATELET
Basophils Absolute: 0 10*3/uL (ref 0.0–0.2)
Basos: 0 %
EOS (ABSOLUTE): 0.2 10*3/uL (ref 0.0–0.4)
Eos: 2 %
Hematocrit: 33.8 % — ABNORMAL LOW (ref 34.0–46.6)
Hemoglobin: 11.1 g/dL (ref 11.1–15.9)
Immature Grans (Abs): 0 10*3/uL (ref 0.0–0.1)
Immature Granulocytes: 0 %
Lymphocytes Absolute: 3.5 10*3/uL — ABNORMAL HIGH (ref 0.7–3.1)
Lymphs: 35 %
MCH: 31.1 pg (ref 26.6–33.0)
MCHC: 32.8 g/dL (ref 31.5–35.7)
MCV: 95 fL (ref 79–97)
Monocytes Absolute: 0.4 10*3/uL (ref 0.1–0.9)
Monocytes: 4 %
Neutrophils Absolute: 5.7 10*3/uL (ref 1.4–7.0)
Neutrophils: 59 %
Platelets: 210 10*3/uL (ref 150–450)
RBC: 3.57 x10E6/uL — ABNORMAL LOW (ref 3.77–5.28)
RDW: 13.2 % (ref 11.7–15.4)
WBC: 9.8 10*3/uL (ref 3.4–10.8)

## 2022-05-02 LAB — MICROSCOPIC EXAMINATION
Casts: NONE SEEN /lpf
Epithelial Cells (non renal): >10 /hpf — AB (ref 0–10)
WBC, UA: >30 /hpf — AB (ref 0–5)

## 2022-05-02 LAB — BASIC METABOLIC PANEL
BUN/Creatinine Ratio: 10 — ABNORMAL LOW (ref 12–28)
BUN: 10 mg/dL (ref 8–27)
CO2: 25 mmol/L (ref 20–29)
Calcium: 9.6 mg/dL (ref 8.7–10.3)
Chloride: 103 mmol/L (ref 96–106)
Creatinine, Ser: 0.96 mg/dL (ref 0.57–1.00)
Glucose: 181 mg/dL — ABNORMAL HIGH (ref 70–99)
Potassium: 3.6 mmol/L (ref 3.5–5.2)
Sodium: 145 mmol/L — ABNORMAL HIGH (ref 134–144)
eGFR: 64 mL/min/{1.73_m2} (ref >59)

## 2022-05-02 LAB — URINE CULTURE, REFLEX

## 2022-05-02 LAB — HEMOGLOBIN A1C
Est. average glucose Bld gHb Est-mCnc: 177 mg/dL
Hgb A1c MFr Bld: 7.8 % — ABNORMAL HIGH (ref 4.8–5.6)

## 2022-06-26 ENCOUNTER — Ambulatory Visit (HOSPITAL_COMMUNITY)
Admission: RE | Admit: 2022-06-26 | Discharge: 2022-06-26 | Disposition: A | Discharge status: Home / Self Care | Payer: 59 | Source: Ambulatory Visit | Attending: Physician Assistant | Admitting: Physician Assistant

## 2022-06-26 ENCOUNTER — Other Ambulatory Visit (HOSPITAL_COMMUNITY)
Admission: RE | Admit: 2022-06-26 | Discharge: 2022-06-26 | Disposition: A | Discharge status: Home / Self Care | Payer: 59 | Source: Ambulatory Visit | Attending: Internal Medicine | Admitting: Internal Medicine

## 2022-06-26 ENCOUNTER — Inpatient Hospital Stay: Payer: 59 | Attending: Hematology

## 2022-06-26 ENCOUNTER — Encounter (HOSPITAL_COMMUNITY): Payer: Self-pay

## 2022-06-26 DIAGNOSIS — M8580 Other specified disorders of bone density and structure, unspecified site: Secondary | ICD-10-CM | POA: Diagnosis not present

## 2022-06-26 DIAGNOSIS — Z1159 Encounter for screening for other viral diseases: Secondary | ICD-10-CM | POA: Diagnosis not present

## 2022-06-26 DIAGNOSIS — M85832 Other specified disorders of bone density and structure, left forearm: Secondary | ICD-10-CM | POA: Diagnosis not present

## 2022-06-26 DIAGNOSIS — C50412 Malignant neoplasm of upper-outer quadrant of left female breast: Secondary | ICD-10-CM | POA: Diagnosis not present

## 2022-06-26 DIAGNOSIS — M85852 Other specified disorders of bone density and structure, left thigh: Secondary | ICD-10-CM | POA: Diagnosis not present

## 2022-06-26 DIAGNOSIS — Z1231 Encounter for screening mammogram for malignant neoplasm of breast: Secondary | ICD-10-CM | POA: Insufficient documentation

## 2022-06-26 DIAGNOSIS — R92323 Mammographic fibroglandular density, bilateral breasts: Secondary | ICD-10-CM | POA: Insufficient documentation

## 2022-06-26 DIAGNOSIS — Z79811 Long term (current) use of aromatase inhibitors: Secondary | ICD-10-CM | POA: Diagnosis not present

## 2022-06-26 DIAGNOSIS — Z170 Estrogen receptor positive status [ER+]: Secondary | ICD-10-CM

## 2022-06-26 DIAGNOSIS — Z9012 Acquired absence of left breast and nipple: Secondary | ICD-10-CM | POA: Insufficient documentation

## 2022-06-26 LAB — CBC WITH DIFFERENTIAL/PLATELET
Abs Immature Granulocytes: 0.06 10*3/uL (ref 0.00–0.07)
Basophils Absolute: 0 10*3/uL (ref 0.0–0.1)
Basophils Relative: 0 %
Eosinophils Absolute: 0.1 10*3/uL (ref 0.0–0.5)
Eosinophils Relative: 2 %
HCT: 31.9 % — ABNORMAL LOW (ref 36.0–46.0)
Hemoglobin: 10.7 g/dL — ABNORMAL LOW (ref 12.0–15.0)
Immature Granulocytes: 1 %
Lymphocytes Relative: 24 %
Lymphs Abs: 2.2 10*3/uL (ref 0.7–4.0)
MCH: 30.5 pg (ref 26.0–34.0)
MCHC: 33.5 g/dL (ref 30.0–36.0)
MCV: 90.9 fL (ref 80.0–100.0)
Monocytes Absolute: 0.4 10*3/uL (ref 0.1–1.0)
Monocytes Relative: 5 %
Neutro Abs: 6.3 10*3/uL (ref 1.7–7.7)
Neutrophils Relative %: 68 %
Platelets: 210 10*3/uL (ref 150–400)
RBC: 3.51 MIL/uL — ABNORMAL LOW (ref 3.87–5.11)
RDW: 12.7 % (ref 11.5–15.5)
WBC: 9.1 10*3/uL (ref 4.0–10.5)
nRBC: 0 % (ref 0.0–0.2)

## 2022-06-26 LAB — COMPREHENSIVE METABOLIC PANEL
ALT: 27 U/L (ref 0–44)
AST: 60 U/L — ABNORMAL HIGH (ref 15–41)
Albumin: 3.1 g/dL — ABNORMAL LOW (ref 3.5–5.0)
Alkaline Phosphatase: 60 U/L (ref 38–126)
Anion gap: 12 (ref 5–15)
BUN: 13 mg/dL (ref 8–23)
CO2: 27 mmol/L (ref 22–32)
Calcium: 9.2 mg/dL (ref 8.9–10.3)
Chloride: 98 mmol/L (ref 98–111)
Creatinine, Ser: 0.85 mg/dL (ref 0.44–1.00)
GFR, Estimated: >60 mL/min (ref >60)
Glucose, Bld: 258 mg/dL — ABNORMAL HIGH (ref 70–99)
Potassium: 3.3 mmol/L — ABNORMAL LOW (ref 3.5–5.1)
Sodium: 137 mmol/L (ref 135–145)
Total Bilirubin: 0.4 mg/dL (ref 0.3–1.2)
Total Protein: 7.8 g/dL (ref 6.5–8.1)

## 2022-06-26 LAB — VITAMIN D 25 HYDROXY (VIT D DEFICIENCY, FRACTURES): Vit D, 25-Hydroxy: 51 ng/mL (ref 30–100)

## 2022-06-28 ENCOUNTER — Ambulatory Visit (INDEPENDENT_AMBULATORY_CARE_PROVIDER_SITE_OTHER): Payer: 59 | Admitting: Internal Medicine

## 2022-06-28 ENCOUNTER — Encounter: Payer: Self-pay | Admitting: Internal Medicine

## 2022-06-28 DIAGNOSIS — Z0000 Encounter for general adult medical examination without abnormal findings: Secondary | ICD-10-CM | POA: Diagnosis not present

## 2022-06-28 NOTE — Progress Notes (Signed)
Subjective:  This is a telephone encounter between Lisa Thornton and Lisa Thornton on 06/28/2022 for AWV. The visit was conducted with the patient located at home and Lisa Thornton at [facility]. The patient's identity was confirmed using their DOB and current address. The patient has consented to being evaluated through a telephone encounter and understands the associated risks (an examination cannot be done and the patient may need to come in for an appointment) / benefits (allows the patient to remain at home, decreasing exposure to coronavirus).      Lisa Thornton is a 67 y.o. female who presents for Medicare Annual (Subsequent) preventive examination.  Review of Systems    Review of Systems  All other systems reviewed and are negative.   Objective:    There were no vitals filed for this visit. There is no height or weight on file to calculate BMI.     06/28/2022    1:31 PM 12/27/2021   10:38 AM 09/27/2021    8:58 AM 09/23/2021    2:37 PM 06/22/2021    3:02 PM 03/29/2021    3:07 PM 12/29/2020   10:22 AM  Advanced Directives  Does Patient Have a Medical Advance Directive? No No No No No No No  Does patient want to make changes to medical advance directive? Yes (MAU/Ambulatory/Procedural Areas - Information given)        Would patient like information on creating a medical advance directive?  No - Patient declined No - Patient declined No - Patient declined No - Patient declined No - Patient declined No - Patient declined    Current Medications (verified) Outpatient Encounter Medications as of 06/28/2022  Medication Sig   Accu-Chek Softclix Lancets lancets USE 1  TO CHECK GLUCOSE UP TO 4 TIMES DAILY   amLODipine (NORVASC) 5 MG tablet Take 1 tablet (5 mg total) by mouth daily.   anastrozole (ARIMIDEX) 1 MG tablet Take 1 tablet by mouth once daily   apixaban (ELIQUIS) 5 MG TABS tablet Take 1 tablet (5 mg total) by mouth 2 (two) times daily.   blood glucose meter kit and supplies Dispense  based on patient and insurance preference. Use up to four times daily as directed. (FOR ICD-10 E10.9, E11.9).   calcium carbonate (OS-CAL) 600 MG TABS tablet Take 600 mg by mouth daily with breakfast.   cholecalciferol (VITAMIN D3) 25 MCG (1000 UT) tablet Take 1,000 Units by mouth 2 (two) times daily.    glucose blood (ACCU-CHEK GUIDE) test strip USE 1 STRIP TO CHECK GLUCOSE THREE TIMES DAILY IN THE MORNING, NOON, AND AT BEDTIME AS DIRECTED   hydrochlorothiazide (HYDRODIURIL) 25 MG tablet Take 1 tablet by mouth once daily   metFORMIN (GLUCOPHAGE) 1000 MG tablet TAKE 1 TABLET BY MOUTH BEFORE BREAKFAST   metoprolol tartrate (LOPRESSOR) 50 MG tablet Take 1 tablet by mouth twice daily   Omega-3 Fatty Acids (FISH OIL) 1000 MG CAPS Take 1 capsule by mouth 2 (two) times daily.   ondansetron (ZOFRAN) 4 MG tablet Take 1 tablet (4 mg total) by mouth every 8 (eight) hours as needed for nausea or vomiting.   rosuvastatin (CRESTOR) 5 MG tablet Take 1 tablet (5 mg total) by mouth daily.   Semaglutide (RYBELSUS) 7 MG TABS Take 1 tablet (7 mg total) by mouth daily.   sulfamethoxazole-trimethoprim (BACTRIM DS) 800-160 MG tablet Take 1 tablet by mouth 2 (two) times daily.   No facility-administered encounter medications on file as of 06/28/2022.    Allergies (verified)  Patient has no known allergies.   History: Past Medical History:  Diagnosis Date   Anemia 12/31/2017   Cancer ([place])    left breast cancer   Chest pain    Diabetes mellitus without complication ([place])    Hypertension    Past Surgical History:  Procedure Laterality Date   COLONOSCOPY WITH PROPOFOL N/A 09/27/2021   Procedure: COLONOSCOPY WITH PROPOFOL;  Surgeon: Harvel Quale, MD;  Location: [facility];  Service: Gastroenterology;  Laterality: N/A;  945   MASTECTOMY MODIFIED RADICAL Left 12/31/2017   Procedure: LEFT MODIFIED RADICAL MASTECTOMY;  Surgeon: Aviva Signs, MD;  Location: [facility];  Service: General;  Laterality:  Left;   POLYPECTOMY  09/27/2021   Procedure: POLYPECTOMY;  Surgeon: Harvel Quale, MD;  Location: [facility];  Service: Gastroenterology;;   PORTACATH PLACEMENT Right 06/27/2017   Procedure: INSERTION PORT-A-CATH;  Surgeon: Aviva Signs, MD;  Location: [facility];  Service: General;  Laterality: Right;   Family History  Problem Relation Age of Onset   Breast cancer Mother    Thyroid disease Mother    Heart disease Mother    Heart attack Father    Heart attack Sister    Hypertension Sister    Heart attack Brother    Cancer Sister    Stroke Brother    Alzheimer's disease Maternal Aunt    Social History   Socioeconomic History   Marital status: Divorced    Spouse name: Not on file   Number of children: 2   Years of education: 10   Highest education level: 10th grade  Occupational History   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  Tobacco Use   Smoking status: Never   Smokeless tobacco: Never  Vaping Use   Vaping Use: Never used  Substance and Sexual Activity   Alcohol use: No   Drug use: No   Sexual activity: Not Currently  Other Topics Concern   Not on file  Social History Narrative   Lives with sister   2 children   Dog: Cozie      Enjoys: puzzles, sewing, and walk      Diet: eats all food groups outside: leafy greens    Caffeine: limited, sweet tea   Water: 6-8 cups daily       No car; does have license, wears seat belt   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   [place] area    Social Determinants of Health   Financial Resource Strain: Low Risk  (03/29/2021)   Overall Financial Resource Strain (CARDIA)    Difficulty of Paying Living Expenses: Not hard at all  Food Insecurity: No Food Insecurity (03/29/2021)   Hunger Vital Sign    Worried About Running Out of Food in the Last Year: Never true    [place] in the Last Year: Never true  Transportation Needs: No Transportation Needs (03/29/2021)   PRAPARE - Hydrologist (Medical): No     Lack of Transportation (Non-Medical): No  Physical Activity: Insufficiently Active (03/29/2021)   Exercise Vital Sign    Days of Exercise per Week: 7 days    Minutes of Exercise per Session: 20 min  Stress: No Stress Concern Present (03/29/2021)   [place]    Feeling of Stress : Not at all  Social Connections: Socially Isolated (03/29/2021)   Social Connection and Isolation Panel [NHANES]    Frequency of Communication with Friends and Family: More than three  times a week    Frequency of Social Gatherings with Friends and Family: Once a week    Attends Religious Services: Never    Marine scientist or Organizations: No    Attends Music therapist: Never    Marital Status: Divorced    Tobacco Counseling Counseling given: Not Answered   Clinical Intake:  Pre-visit preparation completed: Yes  Pain : No/denies pain     Diabetes: Yes CBG done?: No Did pt. bring in CBG monitor from home?: No  How often do you need to have someone help you when you read instructions, pamphlets, or other written materials from your doctor or pharmacy?: 1 - Never What is the last grade level you completed in school?: 10th grade    Activities of Daily Living    06/28/2022    1:33 PM 09/23/2021    2:39 PM  In your present state of health, do you have any difficulty performing the following activities:  Hearing? 0   Vision? 0   Difficulty concentrating or making decisions? 0   Walking or climbing stairs? 0   Dressing or bathing? 0   Doing errands, shopping? 0 0    Patient Care Team: Lindell Spar, MD as PCP - General (Internal Medicine) [name], MD (Inactive) as PCP - Cardiology (Cardiology)  Indicate any recent Medical Services you may have received from other than [facility] providers in the past year (date may be approximate).     Assessment:   This is a routine wellness examination for  Malani.  Hearing/Vision screen No results found.  Dietary issues and exercise activities discussed:     Goals Addressed             This Visit's Progress    CCM:  Monitor and Maintain HbA1c <8%         Depression Screen    06/28/2022    1:33 PM 04/26/2022    1:37 PM 12/08/2021    1:07 PM 11/14/2021    3:06 PM 08/04/2021    4:31 PM 07/18/2021    3:33 PM 03/29/2021    3:03 PM  PHQ 2/9 Scores  PHQ - 2 Score 0 1 0 0 0 0 0    Fall Risk    06/28/2022    1:33 PM 04/26/2022    1:37 PM 12/08/2021    1:07 PM 11/14/2021    3:06 PM 08/04/2021    4:31 PM  [place] in the past year? 0 0 0 0 0  Number falls in past yr:  0 0 0 0  Injury with Fall?  0 0 0 0  Risk for fall due to :   No Fall Risks No Fall Risks No Fall Risks  Follow up   Falls evaluation completed Falls evaluation completed Falls evaluation completed    [employer]:  Any stairs in or around the home? No  If so, are there any without handrails? No  Home free of loose throw rugs in walkways, pet beds, electrical cords, etc? Yes  Adequate lighting in your home to reduce risk of falls? Yes   ASSISTIVE DEVICES UTILIZED TO PREVENT FALLS:  Life alert? No  Use of a cane, walker or w/c? No  Grab bars in the bathroom? No  Shower chair or bench in shower? No  Elevated toilet seat or a handicapped toilet? No    Cognitive Function:  06/28/2022    1:34 PM 03/29/2021    3:08 PM 03/25/2020    3:33 PM  6CIT Screen  What Year? 0 points 0 points 0 points  What month? 0 points 0 points 0 points  What time? 0 points 0 points 0 points  Count back from 20 0 points 0 points 0 points  Months in reverse 0 points 2 points 0 points  Repeat phrase 0 points 0 points 10 points  Total Score 0 points 2 points 10 points    Immunizations Immunization History  Administered Date(s) Administered   Fluad Quad(high Dose 65+) 12/24/2019, 03/17/2021, 12/08/2021   Influenza,inj,Quad PF,6+ Mos  01/15/2018, 01/13/2019   PNEUMOCOCCAL CONJUGATE-20 04/26/2022   Zoster Recombinat (Shingrix) 11/11/2020, 03/17/2021    TDAP status: Up to date  Flu Vaccine status: Up to date  Pneumococcal vaccine status: Up to date  Covid-19 vaccine status: Information provided on how to obtain vaccines.   Qualifies for Shingles Vaccine? Yes   Zostavax completed No   Shingrix Completed?: Yes  Screening Tests Health Maintenance  Topic Date Due   COVID-19 Vaccine (1) Never done   DTaP/Tdap/Td (1 - Tdap) Never done   OPHTHALMOLOGY EXAM  09/28/2021   Medicare Annual Wellness (AWV)  03/29/2022   Diabetic kidney evaluation - Urine ACR  07/21/2022   HEMOGLOBIN A1C  10/25/2022   FOOT EXAM  12/09/2022   MAMMOGRAM  06/16/2023   Diabetic kidney evaluation - eGFR measurement  06/26/2023   COLONOSCOPY (Pts 45-48yrs Insurance coverage will need to be confirmed)  09/27/2028   Pneumonia Vaccine 41+ Years old  Completed   INFLUENZA VACCINE  Completed   DEXA SCAN  Completed   Hepatitis C Screening  Completed   Zoster Vaccines- Shingrix  Completed   HPV VACCINES  Aged Out    Health Maintenance  Health Maintenance Due  Topic Date Due   COVID-19 Vaccine (1) Never done   DTaP/Tdap/Td (1 - Tdap) Never done   OPHTHALMOLOGY EXAM  09/28/2021   Medicare Annual Wellness (AWV)  03/29/2022   Diabetic kidney evaluation - Urine ACR  07/21/2022    Colorectal cancer screening: Type of screening: Colonoscopy. Completed 09/27/2021. Repeat every 7 years  Mammogram status: Completed 06/15/2021. Repeat every year  Bone Density status: Completed 06/26/2022. Results reflect: Bone density results: OSTEOPENIA. Repeat every 2 years.  Lung Cancer Screening: (Low Dose CT Chest recommended if Age 41-80 years, 30 pack-year currently smoking OR have quit w/in 15years.) does not qualify.    Additional Screening:  Hepatitis C Screening: does not qualify; Completed 12/12/2021  Vision Screening: Recommended annual  ophthalmology exams for early detection of glaucoma and other disorders of the eye. Is the patient up to date with their annual eye exam?  No  Who is the provider or what is the name of the office in which the patient attends annual eye exams? NO one current. Calling to make appointment this week.  If pt is not established with a provider, would they like to be referred to a provider to establish care? No .   Dental Screening: Recommended annual dental exams for proper oral hygiene  Community Resource Referral / Chronic Care Management: CRR required this visit?  No   CCM required this visit?  No      Plan:     I have personally reviewed and noted the following in the patient's chart:   Medical and social history Use of alcohol, tobacco or illicit drugs  Current medications and supplements including opioid prescriptions.  Patient is not currently taking opioid prescriptions. Functional ability and status Nutritional status Physical activity Advanced directives List of other physicians Hospitalizations, surgeries, and ER visits in previous 12 months Vitals Screenings to include cognitive, depression, and falls Referrals and appointments  In addition, I have reviewed and discussed with patient certain preventive protocols, quality metrics, and best practice recommendations. A written personalized care plan for preventive services as well as general preventive health recommendations were provided to patient.     Lisa Dy, MD   06/28/2022

## 2022-06-29 ENCOUNTER — Encounter (HOSPITAL_COMMUNITY): Payer: Self-pay | Admitting: Internal Medicine

## 2022-07-02 NOTE — Progress Notes (Unsigned)
[place], [place] [address]   CLINIC:  Medical Oncology/Hematology  PCP:  Lindell Spar, MD [address] / [place] [address] [phone]   REASON FOR VISIT:  Follow-up for history of stage IIIb poorly differentiated left breast cancer   PRIOR THERAPY: - Neoadjuvant dose dense AC x4 cycles from 07/18/2017 through 09/03/2017 - Paclitaxel weekly x12 cycles from 09/19/2017 through 12/06/2017 - Left modified radical mastectomy on 12/31/2017 - Herceptin x1 year, completed 03/04/2019   CURRENT THERAPY: Anastrozole (since 02/27/2018)  BRIEF ONCOLOGIC HISTORY:   Oncology History  Breast cancer of upper-outer quadrant of left female breast ([place])  06/22/2017 Initial Diagnosis   Breast cancer of upper-outer quadrant of left female breast ([place])   06/22/2017 Cancer Staging   Staging form: Breast, AJCC 8th Edition - Clinical stage from 06/22/2017: Stage IIIB (cT3, cN1, cM0, G3, ER+, PR-, HER2-) - Signed by Derek Jack, MD on 06/22/2017   06/29/2017 Imaging   CT chest showing left axillary adenopathy, 1.2 cm anterior carinal adenopathy, left subpectoral lymph node subcentimeter  06/25/2017 2D echocardiogram with ejection fraction of 55-60%   07/18/2017 - 12/06/2017 Chemotherapy   The patient had dexamethasone (DECADRON) 4 MG tablet, 1 of 1 cycle, Start date: --, End date: -- DOXOrubicin (ADRIAMYCIN) chemo injection 110 mg, 60 mg/m2 = 110 mg, Intravenous,  Once, 4 of 4 cycles Administration: 110 mg (07/18/2017), 110 mg (08/01/2017), 110 mg (08/20/2017), 110 mg (09/03/2017) palonosetron (ALOXI) injection 0.25 mg, 0.25 mg, Intravenous,  Once, 4 of 4 cycles Administration: 0.25 mg (07/18/2017), 0.25 mg (08/01/2017), 0.25 mg (08/20/2017), 0.25 mg (09/03/2017) pegfilgrastim-cbqv (UDENYCA) injection 6 mg, 6 mg, Subcutaneous, Once, 4 of 4 cycles Administration: 6 mg (07/19/2017), 6 mg (08/02/2017), 6 mg (08/22/2017), 6 mg (09/05/2017) cyclophosphamide (CYTOXAN) 1,100 mg  in sodium chloride 0.9 % 250 mL chemo infusion, 600 mg/m2 = 1,100 mg, Intravenous,  Once, 4 of 4 cycles Administration: 1,100 mg (07/18/2017), 1,100 mg (08/01/2017), 1,100 mg (08/20/2017), 1,100 mg (09/03/2017) PACLitaxel (TAXOL) 120 mg in sodium chloride 0.9 % 250 mL chemo infusion (</= 80mg /m2), 64 mg/m2 = 120 mg (80 % of original dose 80 mg/m2), Intravenous,  Once, 12 of 12 cycles Dose modification: 64 mg/m2 (80 % of original dose 80 mg/m2, Cycle 5, Reason: Provider Judgment), 60 mg/m2 (75 % of original dose 80 mg/m2, Cycle 15, Reason: Other (see comments)), 40 mg/m2 (50 % of original dose 80 mg/m2, Cycle 16, Reason: Other (see comments)) Administration: 120 mg (09/17/2017), 144 mg (09/24/2017), 144 mg (10/03/2017), 144 mg (10/11/2017), 144 mg (10/18/2017), 144 mg (10/25/2017), 144 mg (11/01/2017), 144 mg (11/08/2017), 144 mg (11/15/2017), 144 mg (11/22/2017), 108 mg (11/29/2017), 72 mg (12/06/2017) fosaprepitant (EMEND) 150 mg, dexamethasone (DECADRON) 12 mg in sodium chloride 0.9 % 145 mL IVPB, , Intravenous,  Once, 4 of 4 cycles Administration:  (07/18/2017),  (08/01/2017),  (08/20/2017),  (09/03/2017)  for chemotherapy treatment.    02/07/2018 - 03/04/2019 Chemotherapy   Patient is on Treatment Plan : BREAST Trastuzumab q21d      Radiation Therapy       CANCER STAGING: Cancer Staging  Breast cancer of upper-outer quadrant of left female breast [facility]) Staging form: Breast, AJCC 8th Edition - Clinical stage from 06/22/2017: Stage IIIB (cT3, cN1, cM0, G3, ER+, PR-, HER2-) - Signed by Derek Jack, MD on 06/22/2017   INTERVAL HISTORY:   [name], a 67 y.o. female, returns for routine follow-up of her left-sided breast cancer. Lisa Thornton was last seen  on 12/27/2021 by Tarri Abernethy PA-C excuse [name].   At today's visit, she  reports feeling ***.  She denies any recent hospitalizations, surgeries, or changes in her  baseline health status.  ***She denies any symptoms of recurrence such as new lumps,  bone pain, chest pain, dyspnea, or abdominal pain.  She has no new headaches, seizures, or focal neurologic deficits.  No B symptoms such as fever, chills, night sweats, unintentional weight loss.   ***She continues to take Arimidex.  She reports mild hair loss and intermittent leg cramps at night.  No hot flashes or mood swings.  She takes 1000 units vitamin D twice daily and 600 mg calcium daily.    She is noted to have mild anemia with Hgb 10.7 on 06/26/2022.  *** *** Bleeding (Eliquis) *** Fatigue, pica *** Supplements  She reports ***% energy and ***% appetite.  She is maintaining stable weight at this time.   ASSESSMENT & PLAN:  1.  Poorly differentiated left-sided breast cancer, stage IIIb (s/p LEFT mastectomy) - On 06/12/2017 she had a left breast biopsy with sarcomatoid features, left axillary lymph node biopsy was negative. - Her 2D echo showed EF 55 to 60% - CT of the chest showed subcarinal adenopathy. - PET CT scan was negative for metastatic disease. -She had 4 cycles of neoadjuvant dose dense AC from 07/18/2017 through 09/03/2017. - She had 12 cycles of weekly paclitaxel from 09/17/2017 through 12/06/2017. -She underwent a left modified radical mastectomy on 12/31/2017. - The pathology revealed a 2.3 cm poorly differentiated invasive ductal carcinoma with sarcomatoid changes, resection margins negative, high-grade DCIS, no skin involvement, 0 out of 8 lymph nodes positive, negative for lymphovascular or perineural invasion.  ER positive, PR and HER-2 negative.  Pathological staging is [id number]. - Her receptors were rechecked on a mastectomy specimen and HER-2 was positive by IHC.  This was initially negative by [place] on biopsy specimen.  ER was negative on mastectomy specimen.  It was 60% positive on biopsy specimen.  Ki 67 has decreased from 30% to 80% on biopsy.  This was reviewed by a second pathologist and confirmed. - It was recommended she have 1 year of Herceptin. Pertuzumab was  not added secondary to lymph node negativity.  - 1 year of Herceptin completed on 03/04/2019. - Anastrozole started on 02/27/2018.*** - BCI testing *** - Mammogram on 06/26/2022 of the right breast is *** PENDING - *** Physical exam today: No discrete nodules or masses in right breast.  Left mastectomy site within normal limits.  No axillary, supraclavicular, pectoral, or epitrochlear lymphadenopathy - History/ROS ***. - Most recent labs (06/26/2022): AST remains mildly elevated around 60, stable.  Other LFTs normal.  CMP otherwise unremarkable.  CBC with mild normocytic anemia (Hgb 10.7) - No red flag symptoms of recurrence per ROS. - PLAN: Continue anastrozole daily.*** - Repeat labs and RTC in 6 months.  *** - Next mammogram due in March 2025***  2.  Normocytic anemia - Noted on labs from 06/26/2022 with Hgb 10.7/MCV 90.9.  Normal creatinine 0.85. - Colonoscopy (09/27/2021): Single nonbleeding colonic angiodysplastic lesion, polyp x 1, nonbleeding internal hemorrhoids - She takes Eliquis in the setting of atrial fibrillation - *** - PLAN: Will check nutritional panel today and call patient with any further instructions or workup needed.  ***  3.  Osteopenia - DEXA (03/07/2018) showed T score -1.8 - DEXA (06/18/2020) showed T score -1.8, osteopenic - Most recent DEXA (06/26/2022): T-score -1.9, osteopenic - She takes vitamin  D 1000 units twice daily and calcium 600 mg daily  - Labs (06/26/2022) normal vitamin D 51.00, calcium 9.2 - PLAN:  Continue to recommend calcium, vitamin D, and weightbearing exercises for breast cancer therapy associated bone loss - Next DEXA due March 2026 ***   4.  Mild hypercalcemia - She had mildly elevated calcium level ranging from 10.4-11 since August 2022.  This could be secondary to vitamin D intake.  - Previous workup (12/19/2021): Calcium 10.0 per CMP.  Vitamin D 51.57.  Normal PTH 29, normal calcium 10.1 per intact PTH/calcium. - Most recent calcium 9.2  (NORMAL)  5.  Other history - Other PMH: Atrial fibrillation (Eliquis), hypertension, diabetes   PLAN SUMMARY: >> *** >> *** >> ***   REVIEW OF SYSTEMS: ***  Review of Systems - Oncology  PHYSICAL EXAM:   Performance status (ECOG): {CHL ONC [id number] *** There were no vitals filed for this visit. Wt Readings from Last 3 Encounters:  04/26/22 151 lb (68.5 kg)  02/13/22 156 lb (70.8 kg)  12/27/21 155 lb 14.4 oz (70.7 kg)   Physical Exam   PAST MEDICAL/SURGICAL HISTORY:  Past Medical History:  Diagnosis Date   Anemia 12/31/2017   Cancer ([place])    left breast cancer   Chest pain    Diabetes mellitus without complication ([place])    Hypertension    Past Surgical History:  Procedure Laterality Date   COLONOSCOPY WITH PROPOFOL N/A 09/27/2021   Procedure: COLONOSCOPY WITH PROPOFOL;  Surgeon: Harvel Quale, MD;  Location: [facility];  Service: Gastroenterology;  Laterality: N/A;  945   MASTECTOMY MODIFIED RADICAL Left 12/31/2017   Procedure: LEFT MODIFIED RADICAL MASTECTOMY;  Surgeon: Aviva Signs, MD;  Location: [facility] ORS;  Service: General;  Laterality: Left;   POLYPECTOMY  09/27/2021   Procedure: POLYPECTOMY;  Surgeon: Harvel Quale, MD;  Location: [facility];  Service: Gastroenterology;;   PORTACATH PLACEMENT Right 06/27/2017   Procedure: INSERTION PORT-A-CATH;  Surgeon: Aviva Signs, MD;  Location: [facility] ORS;  Service: General;  Laterality: Right;    SOCIAL HISTORY:  Social History   Socioeconomic History   Marital status: Divorced    Spouse name: Not on file   Number of children: 2   Years of education: 10   Highest education level: 10th grade  Occupational History   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  Tobacco Use   Smoking status: Never   Smokeless tobacco: Never  Vaping Use   Vaping Use: Never used  Substance and Sexual Activity   Alcohol use: No   Drug use: No   Sexual activity: Not Currently  Other Topics Concern   Not on file  Social  History Narrative   Lives with sister   2 children   Dog: Cozie      Enjoys: puzzles, sewing, and walk      Diet: eats all food groups outside: leafy greens    Caffeine: limited, sweet tea   Water: 6-8 cups daily       No car; does have license, wears seat belt   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   [place] area    Social Determinants of Health   Financial Resource Strain: Low Risk  (03/29/2021)   Overall Financial Resource Strain (CARDIA)    Difficulty of Paying Living Expenses: Not hard at all  Food Insecurity: No Food Insecurity (03/29/2021)   Hunger Vital Sign    Worried About Running Out of Food in the Last Year: Never true    Ran Out  of Food in the Last Year: Never true  Transportation Needs: No Transportation Needs (03/29/2021)   PRAPARE - Hydrologist (Medical): No    Lack of Transportation (Non-Medical): No  Physical Activity: Insufficiently Active (03/29/2021)   Exercise Vital Sign    Days of Exercise per Week: 7 days    Minutes of Exercise per Session: 20 min  Stress: No Stress Concern Present (03/29/2021)   [place]    Feeling of Stress : Not at all  Social Connections: Socially Isolated (03/29/2021)   Social Connection and Isolation Panel [NHANES]    Frequency of Communication with Friends and Family: More than three times a week    Frequency of Social Gatherings with Friends and Family: Once a week    Attends Religious Services: Never    Marine scientist or Organizations: No    Attends Archivist Meetings: Never    Marital Status: Divorced  Human resources officer Violence: Not At Risk (03/29/2021)   Humiliation, Afraid, Rape, and Kick questionnaire    Fear of Current or Ex-Partner: No    Emotionally Abused: No    Physically Abused: No    Sexually Abused: No    FAMILY HISTORY:  Family History  Problem Relation Age of Onset   Breast cancer Mother     Thyroid disease Mother    Heart disease Mother    Heart attack Father    Heart attack Sister    Hypertension Sister    Heart attack Brother    Cancer Sister    Stroke Brother    Alzheimer's disease Maternal Aunt     CURRENT MEDICATIONS:  Current Outpatient Medications  Medication Sig Dispense Refill   Accu-Chek Softclix Lancets lancets USE 1  TO CHECK GLUCOSE UP TO 4 TIMES DAILY 100 each 0   amLODipine (NORVASC) 5 MG tablet Take 1 tablet (5 mg total) by mouth daily. 90 tablet 3   anastrozole (ARIMIDEX) 1 MG tablet Take 1 tablet by mouth once daily 90 tablet 0   apixaban (ELIQUIS) 5 MG TABS tablet Take 1 tablet (5 mg total) by mouth 2 (two) times daily. 180 tablet 3   blood glucose meter kit and supplies Dispense based on patient and insurance preference. Use up to four times daily as directed. (FOR ICD-10 E10.9, E11.9). 1 each 0   calcium carbonate (OS-CAL) 600 MG TABS tablet Take 600 mg by mouth daily with breakfast.     cholecalciferol (VITAMIN D3) 25 MCG (1000 UT) tablet Take 1,000 Units by mouth 2 (two) times daily.      glucose blood (ACCU-CHEK GUIDE) test strip USE 1 STRIP TO CHECK GLUCOSE THREE TIMES DAILY IN THE MORNING, NOON, AND AT BEDTIME AS DIRECTED 100 each 0   hydrochlorothiazide (HYDRODIURIL) 25 MG tablet Take 1 tablet by mouth once daily 90 tablet 0   metFORMIN (GLUCOPHAGE) 1000 MG tablet TAKE 1 TABLET BY MOUTH BEFORE BREAKFAST 90 tablet 0   metoprolol tartrate (LOPRESSOR) 50 MG tablet Take 1 tablet by mouth twice daily 180 tablet 0   Omega-3 Fatty Acids (FISH OIL) 1000 MG CAPS Take 1 capsule by mouth 2 (two) times daily.     ondansetron (ZOFRAN) 4 MG tablet Take 1 tablet (4 mg total) by mouth every 8 (eight) hours as needed for nausea or vomiting. 30 tablet 2   rosuvastatin (CRESTOR) 5 MG tablet Take 1 tablet (5 mg total) by mouth daily. 90 tablet 3  Semaglutide (RYBELSUS) 7 MG TABS Take 1 tablet (7 mg total) by mouth daily. 90 tablet 1   sulfamethoxazole-trimethoprim  (BACTRIM DS) 800-160 MG tablet Take 1 tablet by mouth 2 (two) times daily. 10 tablet 0   No current facility-administered medications for this visit.    ALLERGIES:  No Known Allergies  LABORATORY DATA:  I have reviewed the labs as listed.     Latest Ref Rng & Units 06/26/2022   10:09 AM 04/26/2022    2:13 PM 12/19/2021   12:09 PM  CBC  WBC 4.0 - 10.5 K/uL 9.1  9.8  8.8   Hemoglobin 12.0 - 15.0 g/dL 10.7  11.1  11.0   Hematocrit 36.0 - 46.0 % 31.9  33.8  32.1   Platelets 150 - 400 K/uL 210  210  199       Latest Ref Rng & Units 06/26/2022   10:09 AM 04/26/2022    2:13 PM 12/19/2021   12:09 PM  CMP  Glucose 70 - 99 mg/dL 258  181  322   BUN 8 - 23 mg/dL 13  10  17    Creatinine 0.44 - 1.00 mg/dL 0.85  0.96  1.02   Sodium 135 - 145 mmol/L 137  145  135   Potassium 3.5 - 5.1 mmol/L 3.3  3.6  3.6   Chloride 98 - 111 mmol/L 98  103  98   CO2 22 - 32 mmol/L 27  25  26    Calcium 8.9 - 10.3 mg/dL 9.2  9.6  10.0    10.1   Total Protein 6.5 - 8.1 g/dL 7.8   8.5   Total Bilirubin 0.3 - 1.2 mg/dL 0.4   0.7   Alkaline Phos 38 - 126 U/L 60   48   AST 15 - 41 U/L 60   70   ALT 0 - 44 U/L 27   37     DIAGNOSTIC IMAGING:  I have independently reviewed the scans and discussed with the patient. DG Bone Density  Result Date: 06/26/2022 EXAM: DUAL X-RAY ABSORPTIOMETRY (DXA) FOR BONE MINERAL DENSITY IMPRESSION: Your patient Lisa Thornton completed a BMD test on 06/26/2022 using the [place] (software version: 14.10) manufactured by [employer]. The following summarizes the results of our evaluation. Technologist: [facility] PATIENT BIOGRAPHICAL: Name: Lisa Thornton, Lisa Thornton Patient ID: [id number] Birth Date: 02-13-1956 Height: 64.0 in. Gender: Female Exam Date: 06/26/2022 Weight: 151.0 lbs. Indications: Hx Breast Ca, Caucasian, Secondary Osteoporosis, Follow up Osteopenia, Post Menopausal Fractures: Treatments: Calcium, Anastrozole, Vitamin D DENSITOMETRY RESULTS: Site         Region      Measured Date Measured Age WHO Classification Young Adult T-score BMD         %Change vs. Previous Significant Change (*) DualFemur Neck Left 06/26/2020 68.3 Osteopenia -1.9 0.773 g/cm2 -5.3% Yes DualFemur Neck Left 06/18/2020 66.2 Osteopenia -1.6 0.816 g/cm2 2.6% - DualFemur Neck Left 1/07/2018 64.0 Osteopenia -1.8 0.795 g/cm2 - - DualFemur Total Mean 06/26/2022 68.3 Osteopenia -1.3 0.838 g/cm2 0.0% - DualFemur Total Mean 06/18/2020 66.2 Osteopenia -1.3 0.838 g/cm2 -2.9% Yes DualFemur Total Mean 03/07/2018 64.0 - - 0.863 g/cm2 - - Left Forearm Radius 33% 06/26/2022 68.3 Osteopenia -1.4 0.616 g/cm2 -7.8% Yes Left Forearm Radius 33% 06/18/2020 66.2 Normal -0.6 0.668 g/cm2 -9.6% Yes Left Forearm Radius 33% 03/07/2018 64.0 Normal 0.4 0.739 g/cm2 - - ASSESSMENT: The BMD measured at Femur Neck Left is 0.773 g/cm2 with a T-score of -1.9. This patient is considered  osteopenic according to [facility]) criteria. The scan quality is good. Compared with the prior study on 06/18/20, the BMD of the total mean shows no statistically significant change, however, the lt. forearm shows a significant decrease. Lumbar spine was excluded due to advanced degenerative changes. World Pharmacologist [facility]) criteria for post-menopausal, Caucasian Women: Normal:       T-score at or above -1 SD Osteopenia:   T-score between -1 and -2.5 SD Osteoporosis: T-score at or below -2.5 SD RECOMMENDATIONS: 1. All patients should optimize calcium and vitamin D intake. 2. Consider FDA-approved medical therapies in postmenopausal women and med aged 32 years and older, based on the following: a. A hip or vertebral (clinical or morphometric) fracture b. T-score< -2.5 at the femoral neck or spine after appropriate evaluation to exclude secondary causes c. Low bone mass (T-score between -1.0 and -2.5 at the femoral neck or spine) and a 10-year probability of a hip fracture > 3% or a 10-year probability of a major osteoporosis-related  fracture > 20% based on the [place]-adapted WHO algorithm d. Clinician judgment and/or patient preferences may indicate treatment for people with 10-year fracture probabilities above or below these levels FOLLOW-UP: Patients with diagnosis of osteoporsis or at high risk for fracture should have regular bone mineral density tests. For patients eligible for Medicare, routine testing is allowed once every 2 years. The testing frequency can be increased to one year for patients who have rapidly progressing disease, those who are receiving or discontinuing medical therapy to restore bone mass, or have additional risk factors. I have reviewed this report, and agree with the above findings. [facility] Your patient ARMIYAH ALMEDA completed a FRAX assessment on 06/26/2022 using the [place] (analysis version: 14.10) manufactured by [employer]. The following summarizes the results of our evaluation. PATIENT BIOGRAPHICAL: Name: Lisa Thornton, Lisa Thornton Patient ID: [id number] Birth Date: Jan 03, 1956 Height:    64.0 in. Gender:     [age]    [age]       Weight:    151.0 lbs. Ethnicity:  White                            Exam Date: 06/26/2022 FRAX* RESULTS:  (version: 3.5) 10-year Probability of Fracture1 Major Osteoporotic Fracture2 Hip Fracture 11.3% 1.9% Population: [place] (Caucasian) Risk Factors: Secondary Osteoporosis Based on DualFemur (Left) Neck BMD 1 -The 10-year probability of fracture may be lower than reported if the patient has received treatment. 2 -Major Osteoporotic Fracture: Clinical Spine, Forearm, Hip or Shoulder *FRAX is a Materials engineer of the [employer] for Metabolic Bone Disease, a [place] (WHO) [facility]. ASSESSMENT: The probability of a major osteoporotic fracture is 11.3% within the next ten years. The probability of a hip fracture is 1.9% within the next ten years. Electronically Signed   By: Dorise Bullion III M.D.    On: 06/26/2022 09:39     WRAP UP:  All questions were answered. The patient knows to call the clinic with any problems, questions or concerns.  Medical decision making: ***  Time spent on visit: I spent {CHL ONC TIME VISIT - [id number] counseling the patient face to face. The total time spent in the appointment was {CHL ONC TIME VISIT - [id number] and more than 50% was on counseling.  Harriett Rush, PA-C  ***

## 2022-07-03 ENCOUNTER — Other Ambulatory Visit: Payer: Self-pay

## 2022-07-03 ENCOUNTER — Emergency Department (HOSPITAL_COMMUNITY): Payer: 59

## 2022-07-03 ENCOUNTER — Inpatient Hospital Stay (HOSPITAL_COMMUNITY): Payer: 59

## 2022-07-03 ENCOUNTER — Inpatient Hospital Stay: Payer: 59 | Admitting: Physician Assistant

## 2022-07-03 ENCOUNTER — Ambulatory Visit
Admission: EM | Admit: 2022-07-03 | Discharge: 2022-07-03 | Disposition: A | Discharge status: Home / Self Care | Payer: 59 | Attending: Nurse Practitioner | Admitting: Nurse Practitioner

## 2022-07-03 ENCOUNTER — Encounter (HOSPITAL_COMMUNITY): Payer: Self-pay | Admitting: Emergency Medicine

## 2022-07-03 ENCOUNTER — Inpatient Hospital Stay (HOSPITAL_COMMUNITY)
Admission: EM | Admit: 2022-07-03 | Discharge: 2022-07-10 | DRG: 871 | Disposition: A | Discharge status: Skilled Nursing Facility | Payer: 59 | Source: Ambulatory Visit | Attending: Internal Medicine | Admitting: Internal Medicine

## 2022-07-03 DIAGNOSIS — A4151 Sepsis due to Escherichia coli [E. coli]: Principal | ICD-10-CM | POA: Diagnosis present

## 2022-07-03 DIAGNOSIS — A419 Sepsis, unspecified organism: Secondary | ICD-10-CM | POA: Diagnosis not present

## 2022-07-03 DIAGNOSIS — N390 Urinary tract infection, site not specified: Secondary | ICD-10-CM | POA: Diagnosis not present

## 2022-07-03 DIAGNOSIS — I5023 Acute on chronic systolic (congestive) heart failure: Secondary | ICD-10-CM | POA: Diagnosis not present

## 2022-07-03 DIAGNOSIS — E860 Dehydration: Secondary | ICD-10-CM | POA: Diagnosis present

## 2022-07-03 DIAGNOSIS — R6520 Severe sepsis without septic shock: Secondary | ICD-10-CM | POA: Diagnosis present

## 2022-07-03 DIAGNOSIS — I509 Heart failure, unspecified: Secondary | ICD-10-CM | POA: Diagnosis not present

## 2022-07-03 DIAGNOSIS — Z79811 Long term (current) use of aromatase inhibitors: Secondary | ICD-10-CM

## 2022-07-03 DIAGNOSIS — Z043 Encounter for examination and observation following other accident: Secondary | ICD-10-CM | POA: Diagnosis not present

## 2022-07-03 DIAGNOSIS — Z8249 Family history of ischemic heart disease and other diseases of the circulatory system: Secondary | ICD-10-CM | POA: Diagnosis not present

## 2022-07-03 DIAGNOSIS — G9341 Metabolic encephalopathy: Secondary | ICD-10-CM | POA: Diagnosis present

## 2022-07-03 DIAGNOSIS — R0602 Shortness of breath: Secondary | ICD-10-CM | POA: Diagnosis not present

## 2022-07-03 DIAGNOSIS — I4891 Unspecified atrial fibrillation: Principal | ICD-10-CM | POA: Diagnosis present

## 2022-07-03 DIAGNOSIS — Z79899 Other long term (current) drug therapy: Secondary | ICD-10-CM | POA: Diagnosis not present

## 2022-07-03 DIAGNOSIS — I480 Paroxysmal atrial fibrillation: Secondary | ICD-10-CM | POA: Diagnosis present

## 2022-07-03 DIAGNOSIS — Z823 Family history of stroke: Secondary | ICD-10-CM

## 2022-07-03 DIAGNOSIS — Z7401 Bed confinement status: Secondary | ICD-10-CM | POA: Diagnosis not present

## 2022-07-03 DIAGNOSIS — J181 Lobar pneumonia, unspecified organism: Secondary | ICD-10-CM | POA: Diagnosis present

## 2022-07-03 DIAGNOSIS — E871 Hypo-osmolality and hyponatremia: Secondary | ICD-10-CM | POA: Diagnosis not present

## 2022-07-03 DIAGNOSIS — M6281 Muscle weakness (generalized): Secondary | ICD-10-CM | POA: Diagnosis not present

## 2022-07-03 DIAGNOSIS — R6510 Systemic inflammatory response syndrome (SIRS) of non-infectious origin without acute organ dysfunction: Secondary | ICD-10-CM | POA: Diagnosis not present

## 2022-07-03 DIAGNOSIS — Z853 Personal history of malignant neoplasm of breast: Secondary | ICD-10-CM

## 2022-07-03 DIAGNOSIS — B9620 Unspecified Escherichia coli [E. coli] as the cause of diseases classified elsewhere: Secondary | ICD-10-CM | POA: Diagnosis not present

## 2022-07-03 DIAGNOSIS — A403 Sepsis due to Streptococcus pneumoniae: Secondary | ICD-10-CM | POA: Diagnosis not present

## 2022-07-03 DIAGNOSIS — R6889 Other general symptoms and signs: Secondary | ICD-10-CM | POA: Diagnosis not present

## 2022-07-03 DIAGNOSIS — E1140 Type 2 diabetes mellitus with diabetic neuropathy, unspecified: Secondary | ICD-10-CM | POA: Diagnosis present

## 2022-07-03 DIAGNOSIS — I110 Hypertensive heart disease with heart failure: Secondary | ICD-10-CM | POA: Diagnosis not present

## 2022-07-03 DIAGNOSIS — Z7901 Long term (current) use of anticoagulants: Secondary | ICD-10-CM

## 2022-07-03 DIAGNOSIS — E872 Acidosis, unspecified: Secondary | ICD-10-CM | POA: Diagnosis present

## 2022-07-03 DIAGNOSIS — R7881 Bacteremia: Secondary | ICD-10-CM | POA: Diagnosis not present

## 2022-07-03 DIAGNOSIS — E876 Hypokalemia: Secondary | ICD-10-CM | POA: Diagnosis not present

## 2022-07-03 DIAGNOSIS — I5031 Acute diastolic (congestive) heart failure: Secondary | ICD-10-CM | POA: Diagnosis not present

## 2022-07-03 DIAGNOSIS — R918 Other nonspecific abnormal finding of lung field: Secondary | ICD-10-CM | POA: Diagnosis not present

## 2022-07-03 DIAGNOSIS — E1165 Type 2 diabetes mellitus with hyperglycemia: Secondary | ICD-10-CM | POA: Diagnosis present

## 2022-07-03 DIAGNOSIS — Z1152 Encounter for screening for COVID-19: Secondary | ICD-10-CM

## 2022-07-03 DIAGNOSIS — I499 Cardiac arrhythmia, unspecified: Secondary | ICD-10-CM | POA: Diagnosis not present

## 2022-07-03 DIAGNOSIS — R112 Nausea with vomiting, unspecified: Secondary | ICD-10-CM

## 2022-07-03 DIAGNOSIS — R279 Unspecified lack of coordination: Secondary | ICD-10-CM | POA: Diagnosis not present

## 2022-07-03 DIAGNOSIS — E878 Other disorders of electrolyte and fluid balance, not elsewhere classified: Secondary | ICD-10-CM | POA: Diagnosis not present

## 2022-07-03 DIAGNOSIS — N179 Acute kidney failure, unspecified: Secondary | ICD-10-CM | POA: Diagnosis not present

## 2022-07-03 DIAGNOSIS — I5021 Acute systolic (congestive) heart failure: Secondary | ICD-10-CM | POA: Diagnosis not present

## 2022-07-03 DIAGNOSIS — Z9012 Acquired absence of left breast and nipple: Secondary | ICD-10-CM

## 2022-07-03 DIAGNOSIS — Z9221 Personal history of antineoplastic chemotherapy: Secondary | ICD-10-CM

## 2022-07-03 DIAGNOSIS — J9601 Acute respiratory failure with hypoxia: Secondary | ICD-10-CM | POA: Diagnosis not present

## 2022-07-03 DIAGNOSIS — C50112 Malignant neoplasm of central portion of left female breast: Secondary | ICD-10-CM | POA: Diagnosis present

## 2022-07-03 DIAGNOSIS — J810 Acute pulmonary edema: Secondary | ICD-10-CM | POA: Diagnosis not present

## 2022-07-03 DIAGNOSIS — Z820 Family history of epilepsy and other diseases of the nervous system: Secondary | ICD-10-CM

## 2022-07-03 DIAGNOSIS — Z7984 Long term (current) use of oral hypoglycemic drugs: Secondary | ICD-10-CM

## 2022-07-03 DIAGNOSIS — Z8349 Family history of other endocrine, nutritional and metabolic diseases: Secondary | ICD-10-CM

## 2022-07-03 DIAGNOSIS — Z803 Family history of malignant neoplasm of breast: Secondary | ICD-10-CM

## 2022-07-03 DIAGNOSIS — I10 Essential (primary) hypertension: Secondary | ICD-10-CM | POA: Diagnosis present

## 2022-07-03 DIAGNOSIS — R400 Somnolence: Secondary | ICD-10-CM | POA: Diagnosis not present

## 2022-07-03 DIAGNOSIS — J90 Pleural effusion, not elsewhere classified: Secondary | ICD-10-CM | POA: Diagnosis not present

## 2022-07-03 DIAGNOSIS — I482 Chronic atrial fibrillation, unspecified: Secondary | ICD-10-CM | POA: Diagnosis not present

## 2022-07-03 DIAGNOSIS — Z743 Need for continuous supervision: Secondary | ICD-10-CM | POA: Diagnosis not present

## 2022-07-03 DIAGNOSIS — R000 Tachycardia, unspecified: Secondary | ICD-10-CM | POA: Diagnosis not present

## 2022-07-03 LAB — MAGNESIUM: Magnesium: 1.1 mg/dL — ABNORMAL LOW (ref 1.7–2.4)

## 2022-07-03 LAB — RESP PANEL BY RT-PCR (RSV, FLU A&B, COVID)  RVPGX2
Influenza A by PCR: NEGATIVE
Influenza B by PCR: NEGATIVE
Resp Syncytial Virus by PCR: NEGATIVE
SARS Coronavirus 2 by RT PCR: NEGATIVE

## 2022-07-03 LAB — CBC WITH DIFFERENTIAL/PLATELET
Abs Immature Granulocytes: 0.5 10*3/uL — ABNORMAL HIGH (ref 0.00–0.07)
Basophils Absolute: 0 10*3/uL (ref 0.0–0.1)
Basophils Relative: 0 %
Eosinophils Absolute: 0.9 10*3/uL — ABNORMAL HIGH (ref 0.0–0.5)
Eosinophils Relative: 5 %
HCT: 31.2 % — ABNORMAL LOW (ref 36.0–46.0)
Hemoglobin: 10.7 g/dL — ABNORMAL LOW (ref 12.0–15.0)
Immature Granulocytes: 3 %
Lymphocytes Relative: 12 %
Lymphs Abs: 2.1 10*3/uL (ref 0.7–4.0)
MCH: 31 pg (ref 26.0–34.0)
MCHC: 34.3 g/dL (ref 30.0–36.0)
MCV: 90.4 fL (ref 80.0–100.0)
Monocytes Absolute: 0.7 10*3/uL (ref 0.1–1.0)
Monocytes Relative: 4 %
Neutro Abs: 13.6 10*3/uL — ABNORMAL HIGH (ref 1.7–7.7)
Neutrophils Relative %: 76 %
Platelets: 156 10*3/uL (ref 150–400)
RBC: 3.45 MIL/uL — ABNORMAL LOW (ref 3.87–5.11)
RDW: 13.5 % (ref 11.5–15.5)
WBC: 17.9 10*3/uL — ABNORMAL HIGH (ref 4.0–10.5)
nRBC: 0 % (ref 0.0–0.2)

## 2022-07-03 LAB — TROPONIN I (HIGH SENSITIVITY)
Troponin I (High Sensitivity): 11 ng/L (ref <18)
Troponin I (High Sensitivity): 11 ng/L (ref <18)

## 2022-07-03 LAB — LACTIC ACID, PLASMA: Lactic Acid, Venous: 3 mmol/L (ref 0.5–1.9)

## 2022-07-03 LAB — COMPREHENSIVE METABOLIC PANEL
ALT: 22 U/L (ref 0–44)
AST: 45 U/L — ABNORMAL HIGH (ref 15–41)
Albumin: 2.9 g/dL — ABNORMAL LOW (ref 3.5–5.0)
Alkaline Phosphatase: 51 U/L (ref 38–126)
Anion gap: 14 (ref 5–15)
BUN: 35 mg/dL — ABNORMAL HIGH (ref 8–23)
CO2: 24 mmol/L (ref 22–32)
Calcium: 8.9 mg/dL (ref 8.9–10.3)
Chloride: 93 mmol/L — ABNORMAL LOW (ref 98–111)
Creatinine, Ser: 1.4 mg/dL — ABNORMAL HIGH (ref 0.44–1.00)
GFR, Estimated: 41 mL/min — ABNORMAL LOW (ref >60)
Glucose, Bld: 193 mg/dL — ABNORMAL HIGH (ref 70–99)
Potassium: 3.1 mmol/L — ABNORMAL LOW (ref 3.5–5.1)
Sodium: 131 mmol/L — ABNORMAL LOW (ref 135–145)
Total Bilirubin: 0.9 mg/dL (ref 0.3–1.2)
Total Protein: 8.1 g/dL (ref 6.5–8.1)

## 2022-07-03 LAB — URINALYSIS, ROUTINE W REFLEX MICROSCOPIC
Bilirubin Urine: NEGATIVE
Glucose, UA: NEGATIVE mg/dL
Ketones, ur: NEGATIVE mg/dL
Nitrite: POSITIVE — AB
Protein, ur: 30 mg/dL — AB
Specific Gravity, Urine: 1.016 (ref 1.005–1.030)
pH: 5 (ref 5.0–8.0)

## 2022-07-03 LAB — PROCALCITONIN: Procalcitonin: 69.47 ng/mL

## 2022-07-03 MED ORDER — ANASTROZOLE 1 MG PO TABS
1.0000 mg | ORAL_TABLET | Freq: Every day | ORAL | Status: DC
  Administered 2022-07-04 – 2022-07-10 (×7): 1 mg via ORAL
  Filled 2022-07-03 (×10): qty 1

## 2022-07-03 MED ORDER — METOPROLOL TARTRATE 50 MG PO TABS
50.0000 mg | ORAL_TABLET | Freq: Two times a day (BID) | ORAL | Status: DC
  Administered 2022-07-03 – 2022-07-10 (×13): 50 mg via ORAL
  Filled 2022-07-03 (×13): qty 1

## 2022-07-03 MED ORDER — CHLORHEXIDINE GLUCONATE CLOTH 2 % EX PADS
6.0000 | MEDICATED_PAD | Freq: Every day | CUTANEOUS | Status: DC
  Administered 2022-07-03 – 2022-07-10 (×10): 6 via TOPICAL

## 2022-07-03 MED ORDER — SODIUM CHLORIDE 0.9 % IV BOLUS
1000.0000 mL | Freq: Once | INTRAVENOUS | Status: AC
  Administered 2022-07-03: 1000 mL via INTRAVENOUS

## 2022-07-03 MED ORDER — POTASSIUM CHLORIDE 10 MEQ/100ML IV SOLN
10.0000 meq | Freq: Once | INTRAVENOUS | Status: AC
  Administered 2022-07-03: 10 meq via INTRAVENOUS
  Filled 2022-07-03: qty 100

## 2022-07-03 MED ORDER — POTASSIUM CHLORIDE CRYS ER 20 MEQ PO TBCR
40.0000 meq | EXTENDED_RELEASE_TABLET | Freq: Once | ORAL | Status: AC
  Administered 2022-07-03: 40 meq via ORAL
  Filled 2022-07-03: qty 2

## 2022-07-03 MED ORDER — VANCOMYCIN HCL 1250 MG/250ML IV SOLN
1250.0000 mg | Freq: Once | INTRAVENOUS | Status: AC
  Administered 2022-07-03: 1250 mg via INTRAVENOUS
  Filled 2022-07-03: qty 250

## 2022-07-03 MED ORDER — DILTIAZEM HCL-DEXTROSE 125-5 MG/125ML-% IV SOLN (PREMIX)
5.0000 mg/h | INTRAVENOUS | Status: DC
  Administered 2022-07-03: 15 mg/h via INTRAVENOUS
  Administered 2022-07-03: 5 mg/h via INTRAVENOUS
  Administered 2022-07-04: 10 mg/h via INTRAVENOUS
  Filled 2022-07-03 (×2): qty 125

## 2022-07-03 MED ORDER — MAGNESIUM SULFATE 2 GM/50ML IV SOLN
2.0000 g | Freq: Once | INTRAVENOUS | Status: AC
  Administered 2022-07-03: 2 g via INTRAVENOUS
  Filled 2022-07-03: qty 50

## 2022-07-03 MED ORDER — SODIUM CHLORIDE 0.9 % IV BOLUS
500.0000 mL | Freq: Once | INTRAVENOUS | Status: AC
  Administered 2022-07-04: 500 mL via INTRAVENOUS

## 2022-07-03 MED ORDER — POTASSIUM CHLORIDE IN NACL 40-0.9 MEQ/L-% IV SOLN
INTRAVENOUS | Status: DC
  Filled 2022-07-03 (×2): qty 1000

## 2022-07-03 MED ORDER — SODIUM CHLORIDE 0.9 % IV SOLN
2.0000 g | Freq: Two times a day (BID) | INTRAVENOUS | Status: DC
  Administered 2022-07-03 – 2022-07-04 (×3): 2 g via INTRAVENOUS
  Filled 2022-07-03 (×4): qty 12.5

## 2022-07-03 MED ORDER — SODIUM CHLORIDE 0.9 % IV BOLUS
500.0000 mL | Freq: Once | INTRAVENOUS | Status: AC
  Administered 2022-07-03: 500 mL via INTRAVENOUS

## 2022-07-03 MED ORDER — INSULIN ASPART 100 UNIT/ML IJ SOLN
0.0000 [IU] | Freq: Three times a day (TID) | INTRAMUSCULAR | Status: DC
  Administered 2022-07-04: 3 [IU] via SUBCUTANEOUS
  Administered 2022-07-04 (×2): 2 [IU] via SUBCUTANEOUS

## 2022-07-03 MED ORDER — MAGNESIUM SULFATE 2 GM/50ML IV SOLN: 2.0000 g | Freq: Once | INTRAVENOUS | Status: DC

## 2022-07-03 MED ORDER — METRONIDAZOLE 500 MG/100ML IV SOLN
500.0000 mg | Freq: Two times a day (BID) | INTRAVENOUS | Status: DC
  Administered 2022-07-03 – 2022-07-04 (×2): 500 mg via INTRAVENOUS
  Filled 2022-07-03 (×2): qty 100

## 2022-07-03 MED ORDER — ACETAMINOPHEN 325 MG PO TABS
650.0000 mg | ORAL_TABLET | Freq: Four times a day (QID) | ORAL | Status: DC | PRN
  Administered 2022-07-03 – 2022-07-06 (×7): 650 mg via ORAL
  Filled 2022-07-03 (×7): qty 2

## 2022-07-03 MED ORDER — VANCOMYCIN HCL 750 MG/150ML IV SOLN: 750.0000 mg | INTRAVENOUS | Status: DC

## 2022-07-03 MED ORDER — ONDANSETRON HCL 4 MG/2ML IJ SOLN
4.0000 mg | Freq: Four times a day (QID) | INTRAMUSCULAR | Status: DC | PRN
  Administered 2022-07-06: 4 mg via INTRAVENOUS
  Filled 2022-07-03: qty 2

## 2022-07-03 MED ORDER — ACETAMINOPHEN 650 MG RE SUPP: 650.0000 mg | Freq: Four times a day (QID) | RECTAL | Status: DC | PRN

## 2022-07-03 MED ORDER — POLYETHYLENE GLYCOL 3350 17 G PO PACK
17.0000 g | PACK | Freq: Every day | ORAL | Status: DC | PRN
  Administered 2022-07-07: 17 g via ORAL
  Filled 2022-07-03: qty 1

## 2022-07-03 MED ORDER — ONDANSETRON HCL 4 MG PO TABS: 4.0000 mg | ORAL_TABLET | Freq: Four times a day (QID) | ORAL | Status: DC | PRN

## 2022-07-03 MED ORDER — APIXABAN 5 MG PO TABS
5.0000 mg | ORAL_TABLET | Freq: Two times a day (BID) | ORAL | Status: DC
  Administered 2022-07-03 – 2022-07-10 (×14): 5 mg via ORAL
  Filled 2022-07-03 (×14): qty 1

## 2022-07-03 MED ORDER — INSULIN ASPART 100 UNIT/ML IJ SOLN: 0.0000 [IU] | Freq: Every day | INTRAMUSCULAR | Status: DC

## 2022-07-03 NOTE — Assessment & Plan Note (Addendum)
Meeting severe criteria with tachycardia heart rates up to 150s, ranging from 180-158, tachypnea respirate rate 18-33, leukocytosis of 17.9.  Tmax 99.9.  Blood pressure 102-1 30s.  With evidence of endorgan dysfunction, lactic acidosis- 3, altered mental status, AKI.  Chest X-ray clear.  Because of infection likely from urine-UA with positive leukocytes, nitrites, many bacteria. - Trend lactic acid -Obtain blood cultures -Obtain urine culture -Follow-up COVID, influenza RSV test -Will start broad-spectrum antibiotics IV vancomycin cefepime and metronidazole -Check procalcitonin -Give 2 L bolus, continue hydration

## 2022-07-03 NOTE — Assessment & Plan Note (Deleted)
Meeting SIRS criteria with tachycardia heart rates up to 150s, ranging from 180-158, tachypnea respirate rate 18-33, leukocytosis of 17.9.  Tmax 99.9.  Blood pressure 102-1 30s.  Chest X-ray clear. -Check lactic acid -Obtain blood cultures -Obtain UA -Follow-up COVID, influenza RSV test -Will start broad-spectrum antibiotics IV vancomycin cefepime and metronidazole -Check procalcitonin

## 2022-07-03 NOTE — Assessment & Plan Note (Signed)
History of stage IIIb poorly differentiated left breast cancer, status post left modified radical mastectomy, chemotherapy. -Resume anastrozole

## 2022-07-03 NOTE — Assessment & Plan Note (Signed)
Hypokalemia 3.1, hyponatremia 131, hypomagnesemia 1.1.  Likely from poor oral intake on vomiting over the past 3 days, also on HCTZ. - Replete Lytes -Hold HCTZ

## 2022-07-03 NOTE — Assessment & Plan Note (Addendum)
A-fib with RVR in the setting of electrolyte abnormalities hypokalemia and hypomagnesemia.  Heart rate up to 150s, currently on Cardizem drip, rate has improved.  Blood pressure systolic 0000000.  History of atrial fibrillation on chronic anticoagulation with Eliquis, unsure of compliance over the past 2 days as patient has been ill.  Echo 2020 EF of 55 to 60%.  -Continue Cardizem drip -Resume metoprolol 50 mg twice daily

## 2022-07-03 NOTE — Assessment & Plan Note (Signed)
A1c 7.9. - SSI- S -Hold semaglutide, metformin

## 2022-07-03 NOTE — ED Notes (Signed)
Pt incontinent of urine. Full linen change performed and pericare.

## 2022-07-03 NOTE — ED Provider Notes (Addendum)
[facility]    CSN: [id number] Arrival date & time: 07/03/22  1111      History   Chief Complaint Chief Complaint  Patient presents with   Nausea   Emesis    HPI Lisa Thornton is a 67 y.o. female.   Patient presents today for 3-day history of nausea and vomiting.  Reports she is also feels very weak.  Patient denies chest pain, palpitations, or dizziness/lightheadedness and diarrhea.  She reports it has been hard to walk because she feels so weak. She has a history of A-fib.    Female family member who is across the street is with her today.  Reports this morning, she checked her blood sugar and it was 195 fasting.  Reports yesterday, it got as high as 400.  Reports she took her chronic medication today, however later reports she did not take it yet.    Past Medical History:  Diagnosis Date   Anemia 12/31/2017   Cancer    left breast cancer   Chest pain    Diabetes mellitus without complication    Hypertension     Patient Active Problem List   Diagnosis Date Noted   Hypokalemia 03/17/2021   Onychomycosis 03/17/2021   Encounter for general adult medical examination with abnormal findings 11/11/2020   Fatigue 11/11/2020   Acute cystitis without hematuria 12/24/2019   Type 2 diabetes mellitus with diabetic neuropathy, unspecified 10/29/2019   S/P mastectomy, left 12/31/2017   Atrial fibrillation 12/31/2017   Malignant neoplasm of central portion of left female breast    Essential hypertension 06/22/2017   Breast cancer of upper-outer quadrant of left female breast 06/22/2017    Past Surgical History:  Procedure Laterality Date   COLONOSCOPY WITH PROPOFOL N/A 09/27/2021   Procedure: COLONOSCOPY WITH PROPOFOL;  Surgeon: Harvel Quale, MD;  Location: [facility];  Service: Gastroenterology;  Laterality: N/A;  945   MASTECTOMY MODIFIED RADICAL Left 12/31/2017   Procedure: LEFT MODIFIED RADICAL MASTECTOMY;  Surgeon: Aviva Signs, MD;  Location:  [facility];  Service: General;  Laterality: Left;   POLYPECTOMY  09/27/2021   Procedure: POLYPECTOMY;  Surgeon: Harvel Quale, MD;  Location: [facility];  Service: Gastroenterology;;   PORTACATH PLACEMENT Right 06/27/2017   Procedure: INSERTION PORT-A-CATH;  Surgeon: Aviva Signs, MD;  Location: [facility];  Service: General;  Laterality: Right;    OB History   No obstetric history on file.      Home Medications    Prior to Admission medications   Medication Sig Start Date End Date Taking? Authorizing Provider  Accu-Chek Softclix Lancets lancets USE 1  TO CHECK GLUCOSE UP TO 4 TIMES DAILY 08/16/20   Noreene Larsson, NP  amLODipine (NORVASC) 5 MG tablet Take 1 tablet (5 mg total) by mouth daily. 04/26/22   Lindell Spar, MD  anastrozole (ARIMIDEX) 1 MG tablet Take 1 tablet by mouth once daily 04/17/22   Tarri Abernethy M, PA-C  apixaban (ELIQUIS) 5 MG TABS tablet Take 1 tablet (5 mg total) by mouth 2 (two) times daily. 04/26/22   Lindell Spar, MD  blood glucose meter kit and supplies Dispense based on patient and insurance preference. Use up to four times daily as directed. (FOR ICD-10 E10.9, E11.9). 10/29/19   Perlie Mayo, NP  calcium carbonate (OS-CAL) 600 MG TABS tablet Take 600 mg by mouth daily with breakfast.    [provider]  cholecalciferol (VITAMIN D3) 25 MCG (1000 UT) tablet Take  1,000 Units by mouth 2 (two) times daily.     [provider]  glucose blood (ACCU-CHEK GUIDE) test strip USE 1 STRIP TO CHECK GLUCOSE THREE TIMES DAILY IN THE MORNING, NOON, AND AT BEDTIME AS DIRECTED 04/17/22   Lindell Spar, MD  hydrochlorothiazide (HYDRODIURIL) 25 MG tablet Take 1 tablet by mouth once daily 04/17/22   Lindell Spar, MD  metFORMIN (GLUCOPHAGE) 1000 MG tablet TAKE 1 TABLET BY MOUTH BEFORE BREAKFAST 04/17/22   Lindell Spar, MD  metoprolol tartrate (LOPRESSOR) 50 MG tablet Take 1 tablet by mouth twice daily 03/13/22   Lindell Spar, MD  Omega-3  Fatty Acids (FISH OIL) 1000 MG CAPS Take 1 capsule by mouth 2 (two) times daily.    [provider]  ondansetron (ZOFRAN) 4 MG tablet Take 1 tablet (4 mg total) by mouth every 8 (eight) hours as needed for nausea or vomiting. 05/09/21   Derek Jack, MD  rosuvastatin (CRESTOR) 5 MG tablet Take 1 tablet (5 mg total) by mouth daily. 12/08/21   Lindell Spar, MD  Semaglutide (RYBELSUS) 7 MG TABS Take 1 tablet (7 mg total) by mouth daily. 04/26/22   Lindell Spar, MD  sulfamethoxazole-trimethoprim (BACTRIM DS) 800-160 MG tablet Take 1 tablet by mouth 2 (two) times daily. 04/26/22   Lindell Spar, MD    Family History Family History  Problem Relation Age of Onset   Breast cancer Mother    Thyroid disease Mother    Heart disease Mother    Heart attack Father    Heart attack Sister    Hypertension Sister    Heart attack Brother    Cancer Sister    Stroke Brother    Alzheimer's disease Maternal Aunt     Social History Social History   Tobacco Use   Smoking status: Never   Smokeless tobacco: Never  Vaping Use   Vaping Use: Never used  Substance Use Topics   Alcohol use: No   Drug use: No     Allergies   Patient has no known allergies.   Review of Systems Review of Systems Per HPI  Physical Exam Triage Vital Signs ED Triage Vitals  Enc Vitals Group     BP 07/03/22 1235 110/65     Pulse Rate 07/03/22 1235 (!) 108     Resp 07/03/22 1235 20     Temp 07/03/22 1235 97.7 F (36.5 C)     Temp Source 07/03/22 1235 Oral     SpO2 07/03/22 1235 97 %     Weight --      Height --      Head Circumference --      Peak Flow --      Pain Score 07/03/22 1234 0     Pain Loc --      Pain Edu? --      Excl. in [place]? --    Orthostatic VS for the past 24 hrs:  BP- Lying Pulse- Lying BP- Sitting Pulse- Sitting  07/03/22 1241 119/56 110 104/57 137    Updated Vital Signs BP 110/65 (BP Location: Right Arm)   Pulse (!) 108   Temp 97.7 F (36.5 C) (Oral)   Resp 20    SpO2 97%   Visual Acuity Right Eye Distance:   Left Eye Distance:   Bilateral Distance:    Right Eye Near:   Left Eye Near:    Bilateral Near:     Physical Exam Vitals and nursing note  reviewed.  Constitutional:      General: She is not in acute distress.    Appearance: Normal appearance. She is ill-appearing. She is not toxic-appearing.  HENT:     Head: Normocephalic and atraumatic.     Mouth/Throat:     Mouth: Mucous membranes are moist.     Pharynx: Oropharynx is clear.  Cardiovascular:     Rate and Rhythm: Tachycardia present.  Pulmonary:     Effort: Pulmonary effort is normal. No respiratory distress.  Abdominal:     General: Abdomen is flat. Bowel sounds are increased. There is no distension.     Palpations: Abdomen is soft. There is no mass.     Tenderness: There is no abdominal tenderness. There is no guarding.  Neurological:     Mental Status: She is alert and oriented to person, place, and time.  Psychiatric:        Behavior: Behavior is cooperative.      UC Treatments / Results  Labs (all labs ordered are listed, but only abnormal results are displayed) Labs Reviewed - No data to display  EKG   Radiology No results found.  Procedures Procedures (including critical care time)  Medications Ordered in UC Medications - No data to display  Initial Impression / Assessment and Plan / UC Course  I have reviewed the triage vital signs and the nursing notes.  Pertinent labs & imaging results that were available during my care of the patient were reviewed by me and considered in my medical decision making (see chart for details).   Patient is ill-appearing, normotensive, afebrile, not tachypneic, oxygenating well on room air.  She is tachycardic in triage today.  Atrial fibrillation with RVR During orthostatic vital signs, patient unable to remain standing during procedure Upon standing, heart rate into the 170s EKG obtained that showed A-fib with  RVR Recommended further evaluation and management emergency room IV access initiated and EMS called Patient left urgent care and transported to ED via EMS in stable condition  The patient was given the opportunity to ask questions.  All questions answered to their satisfaction.  The patient is in agreement to this plan.    Final Clinical Impressions(s) / UC Diagnoses   Final diagnoses:  Atrial fibrillation with RVR     Discharge Instructions      Please go to emergency room for further evaluation and management of your symptoms     ED Prescriptions   None    PDMP not reviewed this encounter.   Eulogio Bear, NP 07/03/22 1309    Eulogio Bear, NP 07/03/22 1309

## 2022-07-03 NOTE — ED Triage Notes (Signed)
Pt reports nausea and vomiting x 3 days. Denies chest pain, dizziness, diarrhea, vision changes, cough, headache.   Per niece, pt blood sugar was 400 yesterday, 195 today (fasting).

## 2022-07-03 NOTE — Assessment & Plan Note (Signed)
Systolic 0000000. -Hold Norvasc, HCTZ -Resume metoprolol 50 mg twice daily -Continue Cardizem drip

## 2022-07-03 NOTE — ED Triage Notes (Signed)
Pt arrives via EMS from UC with afib RVR, given 17 mg Cardizem at 1325. Pt reports n/v since Friday. Pt has hx of afib and on eliquis.

## 2022-07-03 NOTE — Progress Notes (Signed)
Pharmacy Antibiotic Note  Lisa Thornton is a 67 y.o. female admitted on 07/03/2022 with sepsis.  Pharmacy has been consulted for Vancomycin and Cefepime dosing. Pt also on Flagyl.  Plan: Cefepime 2gm IV q12h Vancomycin 1250mg  IV now then 750 mg IV Q 24 hrs. Goal AUC 400-550. Expected AUC: 490 SCr used: 1.4 Will f/u renal function, micro data, and pt's clinical condition Vanc levels prn  Height: 5\' 4"  (162.6 cm) Weight: 66 kg (145 lb 8.1 oz) IBW/kg (Calculated) : 54.7  Temp (24hrs), Avg:99.5 F (37.5 C), Min:97.7 F (36.5 C), Max:101.4 F (38.6 C)  Recent Labs  Lab 07/03/22 1410  WBC 17.9*  CREATININE 1.40*    Estimated Creatinine Clearance: 35.9 mL/min (A) (by C-G formula based on SCr of 1.4 mg/dL (H)).    No Known Allergies  Antimicrobials this admission: 4/1 Vanc >> 4/1 Cefepime >> 4/1 Flagyl >>  Microbiology results: 4/1 BCx:   MRSA PCR:   Thank you for allowing pharmacy to be a part of this patient's care.  Sherlon Handing, PharmD, BCPS Please see amion for complete clinical pharmacist phone list 07/03/2022 9:23 PM

## 2022-07-03 NOTE — Assessment & Plan Note (Signed)
1.4, baseline 0.8-1.  Likely from dehydration from nausea vomiting poor oral intake, HCTZ, SIRS.

## 2022-07-03 NOTE — Assessment & Plan Note (Addendum)
Confusion.  Likely secondary to SIRS-unidentified source of infection and dehydration from vomiting and poor oral intake in the past 3 days.  No focal neurologic deficits.  Head CT -age-indeterminate ischemic changes within the left occipital periventricular and subcortical white matter fever chronic, otherwise no acute abnormality. -2 L bolus given, continue N/s + 40 KCL 100cc/hr x 15hrs

## 2022-07-03 NOTE — ED Notes (Signed)
Patient transported to X-ray 

## 2022-07-03 NOTE — Discharge Instructions (Signed)
Please go to emergency room for further evaluation and management of your symptoms

## 2022-07-03 NOTE — ED Provider Notes (Signed)
[facility] Provider Note   CSN: [id number] Arrival date & time: 07/03/22  1334     History  Chief Complaint  Patient presents with   Atrial Fibrillation    Lisa Thornton is a 67 y.o. female.  Patient with history of afib on Elliquis, T2DM, left breast cancer presents today from urgent care after being found in afib RVR. Patient went to urgent care with concerns for 3 days of nausea and vomiting. She also states that she feels weak and fatigued. She denies chest pain, shortness of breath, or palpitations.  She was unaware that she was in A-fib.  She also denies diarrhea or abdominal pain, congestion, dysuria.  Found to be in A-fib RVR with a rate in the 170s with urgent care, sent here for evaluation of same.  EMS during transport did give 17 mg Cardizem in transport with rates improving to the 120s. Patient denies any missed doses of Elliquis.   The history is provided by the patient. No language interpreter was used.  Atrial Fibrillation       Home Medications Prior to Admission medications   Medication Sig Start Date End Date Taking? Authorizing Provider  Acetaminophen-DM (CORICIDIN HBP COLD/COUGH/FLU PO) Take 1 tablet by mouth daily as needed (cough).   Yes [provider]  amLODipine (NORVASC) 5 MG tablet Take 1 tablet (5 mg total) by mouth daily. 04/26/22  Yes Lindell Spar, MD  anastrozole (ARIMIDEX) 1 MG tablet Take 1 tablet by mouth once daily 04/17/22  Yes Pennington, [name] M, PA-C  apixaban (ELIQUIS) 5 MG TABS tablet Take 1 tablet (5 mg total) by mouth 2 (two) times daily. 04/26/22  Yes Lindell Spar, MD  calcium carbonate (OS-CAL) 600 MG TABS tablet Take 600 mg by mouth daily with breakfast.   Yes [provider]  cholecalciferol (VITAMIN D3) 25 MCG (1000 UT) tablet Take 1,000 Units by mouth 2 (two) times daily.    Yes [provider]  hydrochlorothiazide (HYDRODIURIL) 25 MG tablet Take 1 tablet  by mouth once daily 04/17/22  Yes Lindell Spar, MD  metFORMIN (GLUCOPHAGE) 1000 MG tablet TAKE 1 TABLET BY MOUTH BEFORE BREAKFAST Patient taking differently: Take 1,000 mg by mouth daily with breakfast. 04/17/22  Yes Lindell Spar, MD  metoprolol tartrate (LOPRESSOR) 50 MG tablet Take 1 tablet by mouth twice daily 03/13/22  Yes Patel, Colin Broach, MD  Omega-3 Fatty Acids (FISH OIL) 1000 MG CAPS Take 1 capsule by mouth 2 (two) times daily.   Yes [provider]  rosuvastatin (CRESTOR) 5 MG tablet Take 1 tablet (5 mg total) by mouth daily. 12/08/21  Yes Patel, Colin Broach, MD  Semaglutide (RYBELSUS) 7 MG TABS Take 1 tablet (7 mg total) by mouth daily. 04/26/22  Yes Lindell Spar, MD  Accu-Chek Softclix Lancets lancets USE 1  TO CHECK GLUCOSE UP TO 4 TIMES DAILY 08/16/20   Noreene Larsson, NP  blood glucose meter kit and supplies Dispense based on patient and insurance preference. Use up to four times daily as directed. (FOR ICD-10 E10.9, E11.9). 10/29/19   Perlie Mayo, NP  glucose blood (ACCU-CHEK GUIDE) test strip USE 1 STRIP TO CHECK GLUCOSE THREE TIMES DAILY IN THE MORNING, NOON, AND AT BEDTIME AS DIRECTED 04/17/22   Lindell Spar, MD  ondansetron (ZOFRAN) 4 MG tablet Take 1 tablet (4 mg total) by mouth every 8 (eight) hours as needed for nausea or vomiting. Patient not taking:  Reported on 07/03/2022 05/09/21   Derek Jack, MD  sulfamethoxazole-trimethoprim (BACTRIM DS) 800-160 MG tablet Take 1 tablet by mouth 2 (two) times daily. Patient not taking: Reported on 07/03/2022 04/26/22   Lindell Spar, MD      Allergies    Patient has no known allergies.    Review of Systems   Review of Systems  Gastrointestinal:  Positive for nausea and vomiting.  All other systems reviewed and are negative.   Physical Exam Updated Vital Signs BP 135/61   Pulse (!) 117   Temp 99.9 F (37.7 C) (Oral)   Resp (!) 23   Ht 5\' 4"  (1.626 m)   Wt 66 kg   SpO2 93%   BMI 24.98 kg/m  Physical  Exam Vitals and nursing note reviewed.  Constitutional:      General: She is not in acute distress.    Appearance: Normal appearance. She is normal weight. She is not ill-appearing, toxic-appearing or diaphoretic.  HENT:     Head: Normocephalic and atraumatic.  Cardiovascular:     Rate and Rhythm: Tachycardia present. Rhythm irregular.     Heart sounds: Normal heart sounds.  Pulmonary:     Effort: Pulmonary effort is normal. No respiratory distress.     Breath sounds: Normal breath sounds.  Abdominal:     General: Abdomen is flat.     Palpations: Abdomen is soft.     Tenderness: There is no abdominal tenderness.  Musculoskeletal:        General: Normal range of motion.     Cervical back: Normal range of motion.     Comments: 1+ edema present in bilateral lower extremities which patient states is unchanged from baseline  Skin:    General: Skin is warm and dry.  Neurological:     General: No focal deficit present.     Mental Status: She is alert.     GCS: GCS eye subscore is 4. GCS verbal subscore is 5. GCS motor subscore is 6.  Psychiatric:        Mood and Affect: Mood normal.        Behavior: Behavior normal.     ED Results / Procedures / Treatments   Labs (all labs ordered are listed, but only abnormal results are displayed) Labs Reviewed  CBC WITH DIFFERENTIAL/PLATELET - Abnormal; Notable for the following components:      Result Value   WBC 17.9 (*)    RBC 3.45 (*)    Hemoglobin 10.7 (*)    HCT 31.2 (*)    Neutro Abs 13.6 (*)    Eosinophils Absolute 0.9 (*)    Abs Immature Granulocytes 0.50 (*)    All other components within normal limits  COMPREHENSIVE METABOLIC PANEL - Abnormal; Notable for the following components:   Sodium 131 (*)    Potassium 3.1 (*)    Chloride 93 (*)    Glucose, Bld 193 (*)    BUN 35 (*)    Creatinine, Ser 1.40 (*)    Albumin 2.9 (*)    AST 45 (*)    GFR, Estimated 41 (*)    All other components within normal limits  MAGNESIUM -  Abnormal; Notable for the following components:   Magnesium 1.1 (*)    All other components within normal limits  TROPONIN I (HIGH SENSITIVITY)  TROPONIN I (HIGH SENSITIVITY)    EKG EKG Interpretation  Date/Time:  Monday July 03 2022 13:42:18 EDT Ventricular Rate:  123 PR Interval:  QRS Duration: 77 QT Interval:  322 QTC Calculation: 461 R Axis:   -2 Text Interpretation: Atrial fibrillation Low voltage, precordial leads Borderline T abnormalities, diffuse leads afib slower than ecg earlier today Confirmed by Aletta Edouard [phone]) on 07/03/2022 2:13:47 PM  Radiology DG Chest 2 View  Result Date: 07/03/2022 CLINICAL DATA:  AFib EXAM: CHEST - 2 VIEW COMPARISON:  CXR 06/27/17 FINDINGS: Right-sided chest port in place with the tip in the upper SVC, unchanged from prior exam. No pleural effusion. No pneumothorax. No focal airspace opacity. Normal cardiac and mediastinal contours. No radiographically apparent displaced rib fractures. Visualized upper abdomen is unremarkable. Vertebral body heights are maintained. IMPRESSION: No focal airspace opacity. Electronically Signed   By: Marin Roberts M.D.   On: 07/03/2022 14:44    Procedures .Critical Care  Performed by: Bud Face, PA-C Authorized by: Bud Face, PA-C   Critical care provider statement:    Critical care time (minutes):  30   Critical care start time:  07/03/2022 5:00 PM   Critical care end time:  07/03/2022 5:30 PM   Critical care was necessary to treat or prevent imminent or life-threatening deterioration of the following conditions:  Circulatory failure, dehydration and metabolic crisis   Critical care was time spent personally by me on the following activities:  Development of treatment plan with patient or surrogate, discussions with consultants, discussions with primary provider, evaluation of patient's response to treatment, examination of patient, obtaining history from patient or surrogate, ordering and review of  laboratory studies, pulse oximetry, ordering and review of radiographic studies, re-evaluation of patient's condition and review of old charts   Care discussed with: admitting provider       Medications Ordered in ED Medications  diltiazem (CARDIZEM) 125 mg in dextrose 5% 125 mL (1 mg/mL) infusion (10 mg/hr Intravenous Infusion Verify 07/03/22 1453)  magnesium sulfate IVPB 2 g 50 mL (0 g Intravenous Stopped 07/03/22 1657)  potassium chloride SA (KLOR-CON M) CR tablet 40 mEq (40 mEq Oral Given 07/03/22 1551)  potassium chloride 10 mEq in 100 mL IVPB (0 mEq Intravenous Stopped 07/03/22 1655)  sodium chloride 0.9 % bolus 1,000 mL (1,000 mLs Intravenous New Bag/Given 07/03/22 1554)    ED Course/ Medical Decision Making/ A&P                             Medical Decision Making Amount and/or Complexity of Data Reviewed Labs: ordered. Radiology: ordered.  Risk Prescription drug management.   This patient is a 67 y.o. female who presents to the ED for concern of nausea, vomiting, afib RVR, this involves an extensive number of treatment options, and is a complaint that carries with it a high risk of complications and morbidity. The emergent differential diagnosis prior to evaluation includes, but is not limited to,  ACS, gastroenteritis, electrolyte derangement. This is not an exhaustive differential.   Past Medical History / Co-morbidities / Social History: history of afib on Elliquis, T2DM, left breast cancer  Additional history: Chart reviewed. Pertinent results include: Went to urgent care this morning and was found to have A-fib RVR with a rate in the 170s  Physical Exam: Physical exam performed. The pertinent findings include: abdomen soft and non-tender  Lab Tests: I ordered, and personally interpreted labs.  The pertinent results include:  WBC 17.9, hgb 10.7, Na 131, K 3.1, chloride 93, glucose 193, BUN 35, creatinine 1.40 up from 13 and 0.85 7 days ago.  Mag 1.1   Imaging Studies: I  ordered imaging studies including CXR. I independently visualized and interpreted imaging which showed NAD. I agree with the radiologist interpretation.   Cardiac Monitoring:  The patient was maintained on a cardiac monitor.  My attending physician Dr. Melina Copa viewed and interpreted the cardiac monitored which showed an underlying rhythm of: afib RVR rate 120s. I agree with this interpretation.   Medications: I ordered medication including cardizem, magnesium, potassium, fluids  for A-fib RVR, hypomagnesemia, hypokalemia, and dehydration. Reevaluation of the patient after these medicines showed that the patient improved. I have reviewed the patients home medicines and have made adjustments as needed.   Disposition:  Patient presents today with complaints of nausea and vomiting found to be in A-fib RVR with rate in 170s at urgent care, improved to 120s with Cardizem given by EMS. Found to have several electrolyte derangements which we have replaced. On max rate Cardizem, patient continues to be in A-fib RVR with rate in the 110s. Will need admission for same. Discussed patient with hospitalist who agrees to admit.  Originally thought electrolyte derangements to be the etiology of symptoms, however after family arrived they informed me that the patient had been more confused than normal today.  Originally my evaluation she was alert and oriented and neurologically intact without focal deficits.  Upon reevaluation at around 1500 patient is following commands without neurologic deficits, however does appear to be more confused than she was originally.  She did urinate on herself and odor does seem consistent with UTI which would potentially explain her symptoms, however will obtain head CT for comprehensiveness while UA is pending.  Family is understanding and in agreement. Discussed with hospitalist as well who is in agreement with plan and will follow.   This is a shared visit with supervising physician  Dr. Melina Copa who has independently evaluated patient & provided guidance in evaluation/management/disposition, in agreement with care    Final Clinical Impression(s) / ED Diagnoses Final diagnoses:  Atrial fibrillation with rapid ventricular response  Nausea and vomiting, unspecified vomiting type  Hypokalemia  Hypomagnesemia  Acute kidney injury    Rx / DC Orders ED Discharge Orders     None         Nestor Lewandowsky 07/03/22 1934    Hayden Rasmussen, MD 07/04/22 [phone]

## 2022-07-03 NOTE — H&P (Addendum)
History and Physical    Lisa Thornton [id number] DOB: [birth year] DOA: 07/03/2022  PCP: Lindell Spar, MD   Patient coming from: Home  I have personally briefly reviewed patient's old medical records in [place]  Chief Complaint: Nausea vomiting, weakness  HPI: Lisa Thornton is a 67 y.o. female with medical history significant for   Atria fibrillation, hypertension, diabetes mellitus, breast cancer. Patient was brought to the ED reports of nausea vomiting over the past 3 days, and generalized weakness.  Went to the urgent care and was referred to the ED. At the time of my evaluation, patient is a bit confused and denies symptoms previously reported, answers a few questions.  Patient's grand niece-[name] is at bedside and provides most of the history.  She reports that over the past 3 days, patient has had 1-2 episodes of vomiting daily.  No diarrhea.  No abdominal pain.  Blood sugars were in the 400s yesterday.  Patient denies chest pain or urinary symptoms.  It is unknown if she has been taking her medications over the past few days. Patient's nurse reported today for in the ED, patient's speech has become a bit confused, like talking about her mother who died in 07/29/2015.  ED Course: Tmax 99.9.  Heart rate up to 150s.  Respiratory rate 18- 33.  Blood pressure systolic [id number] -[id number].  Potassium 3.1.  Magnesium 1.1.  Chest x-ray clear.  WBC 17.9.  Creatinine 1.4. Cardizem drip started with improvement in heart rate.  1 L bolus given.  Magnesium and potassium supplementation started.  Hospitalist to admit for atrial fibrillation with RVR, possible encephalopathy.  Review of Systems: As per HPI all other systems reviewed and negative.  Past Medical History:  Diagnosis Date   Anemia 12/31/2017   Cancer    left breast cancer   Chest pain    Diabetes mellitus without complication    Hypertension     Past Surgical History:  Procedure Laterality Date   COLONOSCOPY WITH PROPOFOL N/A  09/27/2021   Procedure: COLONOSCOPY WITH PROPOFOL;  Surgeon: Harvel Quale, MD;  Location: [facility];  Service: Gastroenterology;  Laterality: N/A;  945   MASTECTOMY MODIFIED RADICAL Left 12/31/2017   Procedure: LEFT MODIFIED RADICAL MASTECTOMY;  Surgeon: Aviva Signs, MD;  Location: [facility];  Service: General;  Laterality: Left;   POLYPECTOMY  09/27/2021   Procedure: POLYPECTOMY;  Surgeon: Harvel Quale, MD;  Location: [facility];  Service: Gastroenterology;;   PORTACATH PLACEMENT Right 06/27/2017   Procedure: INSERTION PORT-A-CATH;  Surgeon: Aviva Signs, MD;  Location: [facility];  Service: General;  Laterality: Right;     reports that she has never smoked. She has never used smokeless tobacco. She reports that she does not drink alcohol and does not use drugs.  No Known Allergies  Family History  Problem Relation Age of Onset   Breast cancer Mother    Thyroid disease Mother    Heart disease Mother    Heart attack Father    Heart attack Sister    Hypertension Sister    Heart attack Brother    Cancer Sister    Stroke Brother    Alzheimer's disease Maternal Aunt     Prior to Admission medications   Medication Sig Start Date End Date Taking? Authorizing Provider  Acetaminophen-DM (CORICIDIN HBP COLD/COUGH/FLU PO) Take 1 tablet by mouth daily as needed (cough).   Yes [provider]  amLODipine (NORVASC) 5 MG tablet Take 1 tablet (5  mg total) by mouth daily. 04/26/22  Yes Lindell Spar, MD  anastrozole (ARIMIDEX) 1 MG tablet Take 1 tablet by mouth once daily 04/17/22  Yes Pennington, [name] M, PA-C  apixaban (ELIQUIS) 5 MG TABS tablet Take 1 tablet (5 mg total) by mouth 2 (two) times daily. 04/26/22  Yes Lindell Spar, MD  calcium carbonate (OS-CAL) 600 MG TABS tablet Take 600 mg by mouth daily with breakfast.   Yes [provider]  cholecalciferol (VITAMIN D3) 25 MCG (1000 UT) tablet Take 1,000 Units by mouth 2 (two) times daily.    Yes  [provider]  hydrochlorothiazide (HYDRODIURIL) 25 MG tablet Take 1 tablet by mouth once daily 04/17/22  Yes Lindell Spar, MD  metFORMIN (GLUCOPHAGE) 1000 MG tablet TAKE 1 TABLET BY MOUTH BEFORE BREAKFAST Patient taking differently: Take 1,000 mg by mouth daily with breakfast. 04/17/22  Yes Lindell Spar, MD  metoprolol tartrate (LOPRESSOR) 50 MG tablet Take 1 tablet by mouth twice daily 03/13/22  Yes Patel, Colin Broach, MD  Omega-3 Fatty Acids (FISH OIL) 1000 MG CAPS Take 1 capsule by mouth 2 (two) times daily.   Yes [provider]  rosuvastatin (CRESTOR) 5 MG tablet Take 1 tablet (5 mg total) by mouth daily. 12/08/21  Yes Patel, Colin Broach, MD  Semaglutide (RYBELSUS) 7 MG TABS Take 1 tablet (7 mg total) by mouth daily. 04/26/22  Yes Lindell Spar, MD  Accu-Chek Softclix Lancets lancets USE 1  TO CHECK GLUCOSE UP TO 4 TIMES DAILY 08/16/20   Noreene Larsson, NP  blood glucose meter kit and supplies Dispense based on patient and insurance preference. Use up to four times daily as directed. (FOR ICD-10 E10.9, E11.9). 10/29/19   Perlie Mayo, NP  glucose blood (ACCU-CHEK GUIDE) test strip USE 1 STRIP TO CHECK GLUCOSE THREE TIMES DAILY IN THE MORNING, NOON, AND AT BEDTIME AS DIRECTED 04/17/22   Lindell Spar, MD  ondansetron (ZOFRAN) 4 MG tablet Take 1 tablet (4 mg total) by mouth every 8 (eight) hours as needed for nausea or vomiting. Patient not taking: Reported on 07/03/2022 05/09/21   Derek Jack, MD  sulfamethoxazole-trimethoprim (BACTRIM DS) 800-160 MG tablet Take 1 tablet by mouth 2 (two) times daily. Patient not taking: Reported on 07/03/2022 04/26/22   Lindell Spar, MD    Physical Exam: Vitals:   07/03/22 1749 07/03/22 1800 07/03/22 1815 07/03/22 1830  BP: 135/61 114/61 125/77 102/68  Pulse: (!) 117 (!) 124 (!) 115 (!) 110  Resp: (!) 23 (!) 29 (!) 21 (!) 26  Temp: 99.9 F (37.7 C)     TempSrc: Oral     SpO2: 93% 90% 91% 91%  Weight:      Height:         Constitutional: Foul odor from patient's, no wounds appreciated on extremities of back, calm, comfortable Vitals:   07/03/22 1749 07/03/22 1800 07/03/22 1815 07/03/22 1830  BP: 135/61 114/61 125/77 102/68  Pulse: (!) 117 (!) 124 (!) 115 (!) 110  Resp: (!) 23 (!) 29 (!) 21 (!) 26  Temp: 99.9 F (37.7 C)     TempSrc: Oral     SpO2: 93% 90% 91% 91%  Weight:      Height:       Eyes: PERRL, lids and conjunctivae normal ENMT: Mucous membranes are dry.  Neck: normal, supple, no masses, no thyromegaly Respiratory: clear to auscultation bilaterally, no wheezing, no crackles. Normal respiratory effort. No accessory muscle use.  S/p Left mastectomy Cardiovascular: Tachycardic, irregular rate and rhythm, no murmurs / rubs / gallops. No extremity edema Abdomen: no tenderness, no masses palpated. No hepatosplenomegaly. Bowel sounds positive.  Musculoskeletal: no clubbing / cyanosis. No joint deformity upper and lower extremities.  Skin: no rashes, lesions, ulcers. No induration Neurologic: No apparent cranial nerve abnormality, moving extremities spontaneously. Psychiatric: Normal judgment and insight. Alert and oriented x 3. Normal mood.   Labs on Admission: I have personally reviewed following labs and imaging studies  CBC: Recent Labs  Lab 07/03/22 1410  WBC 17.9*  NEUTROABS 13.6*  HGB 10.7*  HCT 31.2*  MCV 90.4  PLT A999333   Basic Metabolic Panel: Recent Labs  Lab 07/03/22 1410  NA 131*  K 3.1*  CL 93*  CO2 24  GLUCOSE 193*  BUN 35*  CREATININE 1.40*  CALCIUM 8.9  MG 1.1*   GFR: Estimated Creatinine Clearance: 35.9 mL/min (A) (by C-G formula based on SCr of 1.4 mg/dL (H)). Liver Function Tests: Recent Labs  Lab 07/03/22 1410  AST 45*  ALT 22  ALKPHOS 51  BILITOT 0.9  PROT 8.1  ALBUMIN 2.9*   Radiological Exams on Admission: DG Chest 2 View  Result Date: 07/03/2022 CLINICAL DATA:  AFib EXAM: CHEST - 2 VIEW COMPARISON:  CXR 06/27/17 FINDINGS: Right-sided chest  port in place with the tip in the upper SVC, unchanged from prior exam. No pleural effusion. No pneumothorax. No focal airspace opacity. Normal cardiac and mediastinal contours. No radiographically apparent displaced rib fractures. Visualized upper abdomen is unremarkable. Vertebral body heights are maintained. IMPRESSION: No focal airspace opacity. Electronically Signed   By: Marin Roberts M.D.   On: 07/03/2022 14:44    EKG: Independently reviewed.  Atrial fibrillation rate 123, QTc 461.  No significant change from prior.  Assessment/Plan Principal Problem:   Atrial fibrillation with RVR Active Problems:   SIRS (systemic inflammatory response syndrome)   Acute metabolic encephalopathy   Electrolyte abnormality   AKI (acute kidney injury)   Essential hypertension   Malignant neoplasm of central portion of left female breast   Type 2 diabetes mellitus with diabetic neuropathy, unspecified  Assessment and Plan: * Atrial fibrillation with RVR A-fib with RVR in the setting of electrolyte abnormalities hypokalemia and hypomagnesemia.  Heart rate up to 150s, currently on Cardizem drip, rate has improved.  Blood pressure systolic 0000000.  History of atrial fibrillation on chronic anticoagulation with Eliquis, unsure of compliance over the past 2 days as patient has been ill.  Echo 2020 EF of 55 to 60%.  -Continue Cardizem drip -Resume metoprolol 50 mg twice daily  AKI (acute kidney injury) 1.4, baseline 0.8-1.  Likely from dehydration from nausea vomiting poor oral intake, HCTZ, SIRS.  Electrolyte abnormality Hypokalemia 3.1, hyponatremia 131, hypomagnesemia 1.1.  Likely from poor oral intake on vomiting over the past 3 days, also on HCTZ. - Replete Lytes -Hold HCTZ  Acute metabolic encephalopathy Confusion.  Likely secondary to SIRS-unidentified source of infection and dehydration from vomiting and poor oral intake in the past 3 days.  No focal neurologic deficits.  Head CT  -age-indeterminate ischemic changes within the left occipital periventricular and subcortical white matter fever chronic, otherwise no acute abnormality. -2 L bolus given, continue N/s + 40 KCL 100cc/hr x 15hrs  Essential hypertension Systolic 0000000. -Hold Norvasc, HCTZ -Resume metoprolol 50 mg twice daily -Continue Cardizem drip  Severe sepsis Meeting severe criteria with tachycardia heart rates up to 150s, ranging from 180-158, tachypnea respirate rate 18-33, leukocytosis  of 17.9.  Tmax 99.9.  Blood pressure 102-1 30s.  With evidence of endorgan dysfunction, lactic acidosis- 3, altered mental status, AKI.  Chest X-ray clear.  Because of infection likely from urine-UA with positive leukocytes, nitrites, many bacteria. - Trend lactic acid -Obtain blood cultures -Obtain urine culture -Follow-up COVID, influenza RSV test -Will start broad-spectrum antibiotics IV vancomycin cefepime and metronidazole -Check procalcitonin -Give 2 L bolus, continue hydration  Type 2 diabetes mellitus with diabetic neuropathy, unspecified A1c 7.9. - SSI- S -Hold semaglutide, metformin  Malignant neoplasm of central portion of left female breast History of stage IIIb poorly differentiated left breast cancer, status post left modified radical mastectomy, chemotherapy. -Resume anastrozole   DVT prophylaxis: Eliquis Code Status: FULL code-confirmed with patient and grand niece - [name] at bedside Family Communication: [place] Niece- Wells Guiles at bedside Disposition Plan: ~ 2 days Consults called:  None  Admission status: Inpt Tele I certify that at the point of admission it is my clinical judgment that the patient will require inpatient hospital care spanning beyond 2 midnights from the point of admission due to high intensity of service, high risk for further deterioration and high frequency of surveillance required.   Author: Bethena Roys, MD 07/03/2022 10:37 PM  For on call review  [email or web address].

## 2022-07-04 ENCOUNTER — Inpatient Hospital Stay (HOSPITAL_COMMUNITY): Payer: 59

## 2022-07-04 DIAGNOSIS — I10 Essential (primary) hypertension: Secondary | ICD-10-CM | POA: Diagnosis not present

## 2022-07-04 DIAGNOSIS — I4891 Unspecified atrial fibrillation: Secondary | ICD-10-CM | POA: Diagnosis not present

## 2022-07-04 DIAGNOSIS — A419 Sepsis, unspecified organism: Secondary | ICD-10-CM | POA: Diagnosis not present

## 2022-07-04 DIAGNOSIS — E1140 Type 2 diabetes mellitus with diabetic neuropathy, unspecified: Secondary | ICD-10-CM

## 2022-07-04 DIAGNOSIS — R6520 Severe sepsis without septic shock: Secondary | ICD-10-CM

## 2022-07-04 LAB — BLOOD CULTURE ID PANEL (REFLEXED) - BCID2

## 2022-07-04 LAB — BASIC METABOLIC PANEL
Anion gap: 8 (ref 5–15)
BUN: 30 mg/dL — ABNORMAL HIGH (ref 8–23)
CO2: 21 mmol/L — ABNORMAL LOW (ref 22–32)
Calcium: 7.8 mg/dL — ABNORMAL LOW (ref 8.9–10.3)
Chloride: 104 mmol/L (ref 98–111)
Creatinine, Ser: 1.29 mg/dL — ABNORMAL HIGH (ref 0.44–1.00)
GFR, Estimated: 45 mL/min — ABNORMAL LOW (ref >60)
Glucose, Bld: 205 mg/dL — ABNORMAL HIGH (ref 70–99)
Potassium: 4.3 mmol/L (ref 3.5–5.1)
Sodium: 133 mmol/L — ABNORMAL LOW (ref 135–145)

## 2022-07-04 LAB — BLOOD GAS, ARTERIAL
Acid-base deficit: 3.2 mmol/L — ABNORMAL HIGH (ref 0.0–2.0)
Bicarbonate: 19.5 mmol/L — ABNORMAL LOW (ref 20.0–28.0)
Drawn by: 412124
FIO2: 100 %
O2 Saturation: 100 %
Patient temperature: 38.4
pCO2 arterial: 30 mmHg — ABNORMAL LOW (ref 32–48)
pH, Arterial: 7.43 (ref 7.35–7.45)
pO2, Arterial: 327 mmHg — ABNORMAL HIGH (ref 83–108)

## 2022-07-04 LAB — CBC
HCT: 27.5 % — ABNORMAL LOW (ref 36.0–46.0)
Hemoglobin: 9.2 g/dL — ABNORMAL LOW (ref 12.0–15.0)
MCH: 30.9 pg (ref 26.0–34.0)
MCHC: 33.5 g/dL (ref 30.0–36.0)
MCV: 92.3 fL (ref 80.0–100.0)
Platelets: 144 10*3/uL — ABNORMAL LOW (ref 150–400)
RBC: 2.98 MIL/uL — ABNORMAL LOW (ref 3.87–5.11)
RDW: 13.8 % (ref 11.5–15.5)
WBC: 11.4 10*3/uL — ABNORMAL HIGH (ref 4.0–10.5)
nRBC: 0 % (ref 0.0–0.2)

## 2022-07-04 LAB — LACTIC ACID, PLASMA
Lactic Acid, Venous: 2.4 mmol/L (ref 0.5–1.9)
Lactic Acid, Venous: 1.6 mmol/L (ref 0.5–1.9)

## 2022-07-04 LAB — GLUCOSE, CAPILLARY
Glucose-Capillary: 174 mg/dL — ABNORMAL HIGH (ref 70–99)
Glucose-Capillary: 197 mg/dL — ABNORMAL HIGH (ref 70–99)
Glucose-Capillary: 205 mg/dL — ABNORMAL HIGH (ref 70–99)
Glucose-Capillary: 179 mg/dL — ABNORMAL HIGH (ref 70–99)
Glucose-Capillary: 162 mg/dL — ABNORMAL HIGH (ref 70–99)

## 2022-07-04 LAB — MRSA NEXT GEN BY PCR, NASAL: MRSA by PCR Next Gen: NOT DETECTED

## 2022-07-04 LAB — HIV ANTIBODY (ROUTINE TESTING W REFLEX): HIV Screen 4th Generation wRfx: NONREACTIVE

## 2022-07-04 LAB — MAGNESIUM: Magnesium: 2.1 mg/dL (ref 1.7–2.4)

## 2022-07-04 MED ORDER — ORAL CARE MOUTH RINSE: 15.0000 mL | OROMUCOSAL | Status: DC | PRN

## 2022-07-04 MED ORDER — SODIUM CHLORIDE 0.9 % IV SOLN: INTRAVENOUS | Status: DC

## 2022-07-04 MED ORDER — INSULIN ASPART 100 UNIT/ML IJ SOLN
0.0000 [IU] | INTRAMUSCULAR | Status: DC
  Administered 2022-07-04 – 2022-07-05 (×2): 2 [IU] via SUBCUTANEOUS
  Administered 2022-07-05: 1 [IU] via SUBCUTANEOUS
  Administered 2022-07-05: 2 [IU] via SUBCUTANEOUS
  Administered 2022-07-05 (×2): 1 [IU] via SUBCUTANEOUS
  Administered 2022-07-05: 2 [IU] via SUBCUTANEOUS
  Administered 2022-07-06 (×2): 1 [IU] via SUBCUTANEOUS
  Administered 2022-07-07: 2 [IU] via SUBCUTANEOUS

## 2022-07-04 MED ORDER — SODIUM CHLORIDE 0.9 % IV BOLUS
500.0000 mL | Freq: Once | INTRAVENOUS | Status: AC
  Administered 2022-07-04: 500 mL via INTRAVENOUS

## 2022-07-04 MED ORDER — ORAL CARE MOUTH RINSE
15.0000 mL | OROMUCOSAL | Status: DC
  Administered 2022-07-04 – 2022-07-10 (×23): 15 mL via OROMUCOSAL

## 2022-07-04 MED ORDER — KETOROLAC TROMETHAMINE 15 MG/ML IJ SOLN
15.0000 mg | Freq: Once | INTRAMUSCULAR | Status: AC
  Administered 2022-07-04: 15 mg via INTRAVENOUS
  Filled 2022-07-04: qty 1

## 2022-07-04 NOTE — Progress Notes (Signed)
Pt BP trending low, notified Dr. Earnest Conroy, cardizem gtt stopped, bolus infusing, lactic 2.4.

## 2022-07-04 NOTE — Progress Notes (Signed)
PHARMACY - PHYSICIAN COMMUNICATION CRITICAL VALUE ALERT - BLOOD CULTURE IDENTIFICATION (BCID)  Lisa Thornton is an 67 y.o. female who presented to [facility] on 07/03/2022 with a chief complaint of n/v and weakness.   Assessment:  Patient with sepsis and ecoli growing in blood cultures.  Name of physician (or Provider) ContactedWynetta Emery MD  Current antibiotics: Cefepime/Vancomycin  Changes to prescribed antibiotics recommended:  Recommendations accepted by provider Will stop vancomycin/flagyl.   Results for orders placed or performed during the hospital encounter of 07/03/22  Blood Culture ID Panel (Reflexed) (Collected: 07/03/2022  9:11 PM)  Result Value Ref Range   Enterococcus faecalis NOT DETECTED NOT DETECTED   Enterococcus Faecium NOT DETECTED NOT DETECTED   Listeria monocytogenes NOT DETECTED NOT DETECTED   Staphylococcus species NOT DETECTED NOT DETECTED   Staphylococcus aureus (BCID) NOT DETECTED NOT DETECTED   Staphylococcus epidermidis NOT DETECTED NOT DETECTED   Staphylococcus lugdunensis NOT DETECTED NOT DETECTED   Streptococcus species NOT DETECTED NOT DETECTED   Streptococcus agalactiae NOT DETECTED NOT DETECTED   Streptococcus pneumoniae NOT DETECTED NOT DETECTED   Streptococcus pyogenes NOT DETECTED NOT DETECTED   A.calcoaceticus-baumannii NOT DETECTED NOT DETECTED   Bacteroides fragilis NOT DETECTED NOT DETECTED   Enterobacterales DETECTED (A) NOT DETECTED   Enterobacter cloacae complex NOT DETECTED NOT DETECTED   Escherichia coli DETECTED (A) NOT DETECTED   Klebsiella aerogenes NOT DETECTED NOT DETECTED   Klebsiella oxytoca NOT DETECTED NOT DETECTED   Klebsiella pneumoniae NOT DETECTED NOT DETECTED   Proteus species NOT DETECTED NOT DETECTED   Salmonella species NOT DETECTED NOT DETECTED   Serratia marcescens NOT DETECTED NOT DETECTED   Haemophilus influenzae NOT DETECTED NOT DETECTED   Neisseria meningitidis NOT DETECTED NOT DETECTED   Pseudomonas aeruginosa  NOT DETECTED NOT DETECTED   Stenotrophomonas maltophilia NOT DETECTED NOT DETECTED   Candida albicans NOT DETECTED NOT DETECTED   Candida auris NOT DETECTED NOT DETECTED   Candida glabrata NOT DETECTED NOT DETECTED   Candida krusei NOT DETECTED NOT DETECTED   Candida parapsilosis NOT DETECTED NOT DETECTED   Candida tropicalis NOT DETECTED NOT DETECTED   Cryptococcus neoformans/gattii NOT DETECTED NOT DETECTED   CTX-M ESBL NOT DETECTED NOT DETECTED   Carbapenem resistance IMP NOT DETECTED NOT DETECTED   Carbapenem resistance KPC NOT DETECTED NOT DETECTED   Carbapenem resistance NDM NOT DETECTED NOT DETECTED   Carbapenem resist OXA 48 LIKE NOT DETECTED NOT DETECTED   Carbapenem resistance VIM NOT DETECTED NOT DETECTED    Erin Hearing PharmD., BCPS Clinical Pharmacist 07/04/2022 4:02 PM

## 2022-07-04 NOTE — Progress Notes (Signed)
Pt developed fever again, tylenol given increased O2 to 10L [place] d/t O2 sats 86% and notified RT. Orders placed by Dr. Earnest Conroy for CXR and Bipap, notified RT for bipap and radiology tech for CXR, pt has no complaints other than chills, pt remains confused as well.   TEMP 101.37F axillary.

## 2022-07-04 NOTE — Progress Notes (Signed)
PROGRESS NOTE   Lisa Thornton  [id number] DOB: 17-Jun-1955 DOA: 07/03/2022 PCP: Lindell Spar, MD   Chief Complaint  Patient presents with   Atrial Fibrillation   Level of care: Stepdown  Brief Admission History:  67 y.o. female with medical history significant for   Atria fibrillation, hypertension, diabetes mellitus, breast cancer. Patient was brought to the ED reports of nausea vomiting over the past 3 days, and generalized weakness.  Went to the urgent care and was referred to the ED. At the time of my evaluation, patient is a bit confused and denies symptoms previously reported, answers a few questions.  Patient's grand niece-[name] is at bedside and provides most of the history.  She reports that over the past 3 days, patient has had 1-2 episodes of vomiting daily.  No diarrhea.  No abdominal pain.  Blood sugars were in the 400s yesterday.  Patient denies chest pain or urinary symptoms.  It is unknown if she has been taking her medications over the past few days.  Patient's nurse reported today for in the ED, patient's speech has become a bit confused, like talking about her mother who died in 07/29/15.   ED Course: Tmax 99.9.  Heart rate up to 150s.  Respiratory rate 18- 33.  Blood pressure systolic [id number].  Potassium 3.1.  Magnesium 1.1.  Chest x-ray clear.  WBC 17.9.  Creatinine 1.4.  Cardizem drip started with improvement in heart rate.  1 L bolus given.  Magnesium and potassium supplementation started.  Hospitalist to admit for atrial fibrillation with RVR, possible encephalopathy.   Assessment and Plan: Atrial fibrillation with RVR A-fib with RVR in the setting of electrolyte abnormalities hypokalemia and hypomagnesemia.  Heart rate up to 150s, currently on Cardizem drip, rate has improved.  Blood pressure systolic 0000000.  History of atrial fibrillation on chronic anticoagulation with Eliquis, unsure of compliance over the past 2 days as patient has been ill.  Echo 29-Jul-2018 EF of 55  to 60%.  -Continue Cardizem drip -Resume metoprolol 50 mg twice daily  Severe sepsis due to E coli Bacteremia  Meeting severe criteria with tachycardia heart rates up to 150s, ranging from 180-158, tachypnea respirate rate 18-33, leukocytosis of 17.9.  Tmax 99.9.  Blood pressure 102-1 30s.  With evidence of endorgan dysfunction, lactic acidosis- 3, altered mental status, AKI.  Chest X-ray clear.  Because of infection likely from urine-UA with positive leukocytes, nitrites, many bacteria. - Trend lactic acid -blood cultures 2/2 positive for E coli  -follow urine culture -changed antibiotic coverage to ceftriaxone 2 gm IV every 24 hours   AKI (acute kidney injury) 1.4, baseline 0.8-1.  Likely from dehydration from nausea vomiting poor oral intake, HCTZ, SIRS.  Hypokalemia/hypomagnesemia  - repleted   Hyponatremia from dehydration and diuretic therapy -Hold HCTZ -continue IV fluid   Acute metabolic encephalopathy Confusion.  Likely secondary to SIRS-unidentified source of infection and dehydration from vomiting and poor oral intake in the past 3 days.  No focal neurologic deficits.  Head CT -age-indeterminate ischemic changes within the left occipital periventricular and subcortical white matter fever chronic, otherwise no acute abnormality. -2 L bolus given, continue N/s + 40 KCL 100cc/hr x 15hrs  Type 2 diabetes mellitus with diabetic neuropathy, unspecified A1c 7.9. - SSI- S -Hold semaglutide, metformin CBG (last 3)  Recent Labs    07/04/22 0800 07/04/22 1131 07/04/22 1612  GLUCAP 197* 205* 179*   Malignant neoplasm of central portion of left female breast History of  stage IIIb poorly differentiated left breast cancer, status post left modified radical mastectomy, chemotherapy. -Resume anastrozole  Essential hypertension Systolic 0000000. -Hold Norvasc, HCTZ -Resume metoprolol 50 mg twice daily -Continue Cardizem drip  DVT prophylaxis: apixaban Code Status: Full   Family Communication: nephew at bedside 4/2  Disposition: Status is: Inpatient Remains inpatient appropriate because: critical illness    Consultants:   Procedures:   Antimicrobials:  Cefepime 4/1-4/2 Ceftriaxone 4/2>>  Subjective: Critically ill; on bipap  Objective: Vitals:   07/04/22 1710 07/04/22 1750 07/04/22 1800 07/04/22 1821  BP:  133/61 (!) 128/53   Pulse: (!) 108 99 100 98  Resp:      Temp:      TempSrc:      SpO2: 98% 99% 97% 99%  Weight:      Height:        Intake/Output Summary (Last 24 hours) at 07/04/2022 1823 Last data filed at 07/04/2022 1800 Gross per 24 hour  Intake 6016.69 ml  Output 600 ml  Net 5416.69 ml   Filed Weights   07/03/22 1343 07/03/22 2206  Weight: 66 kg 67.1 kg   Examination:  General exam: Appears critically ill on continuous bipap; somnolent but arousable;  Respiratory system: tachypneic with shallow breathing on bipap. Cardiovascular system: irregularly irregular, tachycardic, normal S1 & S2 heard. No JVD, murmurs, rubs, gallops or clicks. No pedal edema. Gastrointestinal system: Abdomen is nondistended, soft and nontender. No organomegaly or masses felt. Normal bowel sounds heard. Central nervous system: somnolent. No focal neurological deficits. Extremities: Symmetric 5 x 5 power. Skin: No rashes, lesions or ulcers. Psychiatry: Judgement and insight UTD. Mood & affect UTD.   Data Reviewed: I have personally reviewed following labs and imaging studies  CBC: Recent Labs  Lab 07/03/22 1410 07/04/22 0320  WBC 17.9* 11.4*  NEUTROABS 13.6*  --   HGB 10.7* 9.2*  HCT 31.2* 27.5*  MCV 90.4 92.3  PLT 156 144*    Basic Metabolic Panel: Recent Labs  Lab 07/03/22 1410 07/04/22 0320  NA 131* 133*  K 3.1* 4.3  CL 93* 104  CO2 24 21*  GLUCOSE 193* 205*  BUN 35* 30*  CREATININE 1.40* 1.29*  CALCIUM 8.9 7.8*  MG 1.1* 2.1    CBG: Recent Labs  Lab 07/03/22 2205 07/04/22 0800 07/04/22 1131 07/04/22 1612  GLUCAP 174*  197* 205* 179*    Recent Results (from the past 240 hour(s))  Resp panel by RT-PCR (RSV, Flu A&B, Covid) Anterior Nasal Swab     Status: None   Collection Time: 07/03/22  6:56 PM   Specimen: Anterior Nasal Swab  Result Value Ref Range Status   SARS Coronavirus 2 by RT PCR NEGATIVE NEGATIVE Final    Comment: (NOTE) SARS-CoV-2 target nucleic acids are NOT DETECTED.  The SARS-CoV-2 RNA is generally detectable in upper respiratory specimens during the acute phase of infection. The lowest concentration of SARS-CoV-2 viral copies this assay can detect is 138 copies/mL. A negative result does not preclude SARS-Cov-2 infection and should not be used as the sole basis for treatment or other patient management decisions. A negative result may occur with  improper specimen collection/handling, submission of specimen other than nasopharyngeal swab, presence of viral mutation(s) within the areas targeted by this assay, and inadequate number of viral copies(<138 copies/mL). A negative result must be combined with clinical observations, patient history, and epidemiological information. The expected result is Negative.  Fact Sheet for Patients:  EntrepreneurPulse.com.au  Fact Sheet for Healthcare Providers:  IncredibleEmployment.be  This test is no t yet approved or cleared by the [place] and  has been authorized for detection and/or diagnosis of SARS-CoV-2 by FDA under an Emergency Use Authorization (EUA). This EUA will remain  in effect (meaning this test can be used) for the duration of the COVID-19 declaration under Section 564(b)(1) of the Act, 21 U.S.C.section 360bbb-3(b)(1), unless the authorization is terminated  or revoked sooner.       Influenza A by PCR NEGATIVE NEGATIVE Final   Influenza B by PCR NEGATIVE NEGATIVE Final    Comment: (NOTE) The Xpert Xpress SARS-CoV-2/FLU/RSV plus assay is intended as an aid in the diagnosis of  influenza from Nasopharyngeal swab specimens and should not be used as a sole basis for treatment. Nasal washings and aspirates are unacceptable for Xpert Xpress SARS-CoV-2/FLU/RSV testing.  Fact Sheet for Patients: EntrepreneurPulse.com.au  Fact Sheet for Healthcare Providers: IncredibleEmployment.be  This test is not yet approved or cleared by the [place] FDA and has been authorized for detection and/or diagnosis of SARS-CoV-2 by FDA under an Emergency Use Authorization (EUA). This EUA will remain in effect (meaning this test can be used) for the duration of the COVID-19 declaration under Section 564(b)(1) of the Act, 21 U.S.C. section 360bbb-3(b)(1), unless the authorization is terminated or revoked.     Resp Syncytial Virus by PCR NEGATIVE NEGATIVE Final    Comment: (NOTE) Fact Sheet for Patients: EntrepreneurPulse.com.au  Fact Sheet for Healthcare Providers: IncredibleEmployment.be  This test is not yet approved or cleared by the [place] FDA and has been authorized for detection and/or diagnosis of SARS-CoV-2 by FDA under an Emergency Use Authorization (EUA). This EUA will remain in effect (meaning this test can be used) for the duration of the COVID-19 declaration under Section 564(b)(1) of the Act, 21 U.S.C. section 360bbb-3(b)(1), unless the authorization is terminated or revoked.  Performed at [facility], [address], [place], [place] [address]   Culture, blood (Routine X 2) w Reflex to ID Panel     Status: None (Preliminary result)   Collection Time: 07/03/22  9:11 PM   Specimen: BLOOD RIGHT HAND  Result Value Ref Range Status   Specimen Description   Final    BLOOD RIGHT HAND Performed at [facility], [place] [address], [place], [place] [address]    Special Requests   Final    BOTTLES DRAWN AEROBIC AND ANAEROBIC Blood Culture adequate volume Performed at [facility], [address] [place], [place] [address]    Culture  Setup Time   Final    GRAM NEGATIVE RODS Gram Stain Report Called to,Read Back By and Verified With: SHELTON, A. @ [phone] 07/04/2022 BY FRATTO, A. IN BOTH AEROBIC AND ANAEROBIC BOTTLES CRITICAL RESULT CALLED TO, READ BACK BY AND VERIFIED WITH: PHARMD Lu Duffel FB:[phone] BY JRS Performed at [facility], [place] [address], [place], [place] [address]    Culture GRAM NEGATIVE RODS  Final   Report Status PENDING  Incomplete  Blood Culture ID Panel (Reflexed)     Status: Abnormal   Collection Time: 07/03/22  9:11 PM  Result Value Ref Range Status   Enterococcus faecalis NOT DETECTED NOT DETECTED Final   Enterococcus Faecium NOT DETECTED NOT DETECTED Final   Listeria monocytogenes NOT DETECTED NOT DETECTED Final   Staphylococcus species NOT DETECTED NOT DETECTED Final   Staphylococcus aureus (BCID) NOT DETECTED NOT DETECTED Final   Staphylococcus epidermidis NOT DETECTED NOT DETECTED Final   Staphylococcus lugdunensis  NOT DETECTED NOT DETECTED Final   Streptococcus species NOT DETECTED NOT DETECTED Final   Streptococcus agalactiae NOT DETECTED NOT DETECTED Final   Streptococcus pneumoniae NOT DETECTED NOT DETECTED Final   Streptococcus pyogenes NOT DETECTED NOT DETECTED Final   A.calcoaceticus-baumannii NOT DETECTED NOT DETECTED Final   Bacteroides fragilis NOT DETECTED NOT DETECTED Final   Enterobacterales DETECTED (A) NOT DETECTED Final    Comment: Enterobacterales represent a large order of gram negative bacteria, not a single organism. CRITICAL RESULT CALLED TO, READ BACK BY AND VERIFIED WITH: PHARMD DEVAN MITCHELL FB:[phone] BY JRS    Enterobacter cloacae complex NOT DETECTED NOT DETECTED Final   Escherichia coli DETECTED (A) NOT DETECTED Final    Comment: CRITICAL RESULT CALLED TO, READ BACK BY AND VERIFIED WITH: PHARMD DEVAN MITCHELL FB:[phone] BY JRS    Klebsiella aerogenes NOT DETECTED NOT DETECTED Final    Klebsiella oxytoca NOT DETECTED NOT DETECTED Final   Klebsiella pneumoniae NOT DETECTED NOT DETECTED Final   Proteus species NOT DETECTED NOT DETECTED Final   Salmonella species NOT DETECTED NOT DETECTED Final   Serratia marcescens NOT DETECTED NOT DETECTED Final   Haemophilus influenzae NOT DETECTED NOT DETECTED Final   Neisseria meningitidis NOT DETECTED NOT DETECTED Final   Pseudomonas aeruginosa NOT DETECTED NOT DETECTED Final   Stenotrophomonas maltophilia NOT DETECTED NOT DETECTED Final   Candida albicans NOT DETECTED NOT DETECTED Final   Candida auris NOT DETECTED NOT DETECTED Final   Candida glabrata NOT DETECTED NOT DETECTED Final   Candida krusei NOT DETECTED NOT DETECTED Final   Candida parapsilosis NOT DETECTED NOT DETECTED Final   Candida tropicalis NOT DETECTED NOT DETECTED Final   Cryptococcus neoformans/gattii NOT DETECTED NOT DETECTED Final   CTX-M ESBL NOT DETECTED NOT DETECTED Final   Carbapenem resistance IMP NOT DETECTED NOT DETECTED Final   Carbapenem resistance KPC NOT DETECTED NOT DETECTED Final   Carbapenem resistance NDM NOT DETECTED NOT DETECTED Final   Carbapenem resist OXA 48 LIKE NOT DETECTED NOT DETECTED Final   Carbapenem resistance VIM NOT DETECTED NOT DETECTED Final    Comment: Performed at [facility], [address], [place], [place] [address]  Culture, blood (Routine X 2) w Reflex to ID Panel     Status: None (Preliminary result)   Collection Time: 07/03/22  9:13 PM   Specimen: BLOOD LEFT ARM  Result Value Ref Range Status   Specimen Description   Final    BLOOD LEFT ARM Performed at [facility], [place] [address] [place], [place] [address]    Special Requests   Final    BOTTLES DRAWN AEROBIC AND ANAEROBIC Blood Culture adequate volume Performed at [facility], [address], [place], [place] [address]    Culture  Setup Time   Final    GRAM NEGATIVE RODS Gram Stain Report Called to,Read Back By and Verified With: SHELTON, A.  @[phone]  07/04/2022 BY FRATTO, A. IN BOTH AEROBIC AND ANAEROBIC BOTTLES CRITICAL VALUE NOTED.  VALUE IS CONSISTENT WITH PREVIOUSLY REPORTED AND CALLED VALUE. Performed at [facility], [place] [address] [place], [place] [address]    Culture GRAM NEGATIVE RODS  Final   Report Status PENDING  Incomplete  MRSA Next Gen by PCR, Nasal     Status: None   Collection Time: 07/03/22  9:18 PM   Specimen: Nasal Mucosa; Nasal Swab  Result Value Ref Range Status   MRSA by PCR Next Gen NOT DETECTED NOT DETECTED Final  Comment: (NOTE) The GeneXpert MRSA Assay (FDA approved for NASAL specimens only), is one component of a comprehensive MRSA colonization surveillance program. It is not intended to diagnose MRSA infection nor to guide or monitor treatment for MRSA infections. Test performance is not FDA approved in patients less than 66 years old. Performed at [facility], [address], [place], [place] [address]      Radiology Studies: [facility] Chest [place] 1 View  Result Date: 07/04/2022 CLINICAL DATA:  67 year old female with history of dyspnea. EXAM: PORTABLE CHEST 1 VIEW COMPARISON:  Chest x-ray 07/03/2022. FINDINGS: Right subclavian single-lumen power porta cath with tip terminating in the distal superior vena cava. Areas of interstitial prominence and peribronchial cuffing, most evident in the mid to lower lungs bilaterally. Focal opacity in the retrocardiac region on the left likely reflects atelectasis, although developing airspace consolidation is not excluded. No pleural effusions. No pneumothorax. No evidence of pulmonary edema. Heart size is normal. Upper mediastinal contours are within normal limits. IMPRESSION: 1. The appearance of the chest may suggest an acute bronchitis. 2. There is also a retrocardiac opacity in the medial left base which may reflect an area of atelectasis or developing airspace consolidation. Followup PA and lateral chest X-ray is recommended in 3-4 weeks following trial of  antibiotic therapy to ensure resolution and exclude underlying malignancy. Electronically Signed   By: Vinnie Langton M.D.   On: 07/04/2022 05:45   CT Head Wo Contrast  Result Date: 07/03/2022 CLINICAL DATA:  Nausea and vomiting for 3 days, altered level of consciousness EXAM: CT HEAD WITHOUT CONTRAST TECHNIQUE: Contiguous axial images were obtained from the base of the skull through the vertex without intravenous contrast. RADIATION DOSE REDUCTION: This exam was performed according to the departmental dose-optimization program which includes automated exposure control, adjustment of the mA and/or kV according to patient size and/or use of iterative reconstruction technique. COMPARISON:  None Available. FINDINGS: Brain: Hypodensity in the left occipital periventricular and subcortical white matter consistent with age indeterminate ischemic change. No other signs of acute infarct or hemorrhage. Lateral ventricles and midline structures are unremarkable. There are no acute extra-axial fluid collections. No mass effect. Vascular: Atherosclerosis of the internal carotid arteries. No hyperdense vessel. Skull: Normal. Negative for fracture or focal lesion. Sinuses/Orbits: Prominent mucoperiosteal thickening of the bilateral maxillary sinuses, with a small superimposed gas fluid level on the left. Minimal retained secretions within the sphenoid sinus. Other: None. IMPRESSION: 1. Age indeterminate ischemic changes within the left occipital periventricular and subcortical white matter, favor chronic. 2. Otherwise no acute intracranial process. 3. Bilateral maxillary sinus disease, with evidence of superimposed acute left maxillary sinusitis. Electronically Signed   By: Randa Ngo M.D.   On: 07/03/2022 19:43   DG Chest 2 View  Result Date: 07/03/2022 CLINICAL DATA:  AFib EXAM: CHEST - 2 VIEW COMPARISON:  CXR 06/27/17 FINDINGS: Right-sided chest port in place with the tip in the upper SVC, unchanged from prior exam.  No pleural effusion. No pneumothorax. No focal airspace opacity. Normal cardiac and mediastinal contours. No radiographically apparent displaced rib fractures. Visualized upper abdomen is unremarkable. Vertebral body heights are maintained. IMPRESSION: No focal airspace opacity. Electronically Signed   By: Marin Roberts M.D.   On: 07/03/2022 14:44    Scheduled Meds:  anastrozole  1 mg Oral Daily   apixaban  5 mg Oral BID   Chlorhexidine Gluconate Cloth  6 each Topical Daily   insulin aspart  0-5 Units Subcutaneous QHS   insulin aspart  0-9 Units Subcutaneous TID WC   metoprolol tartrate  50 mg Oral BID   mouth rinse  15 mL Mouth Rinse 4 times per day   Continuous Infusions:  sodium chloride 140 mL/hr at 07/04/22 1733   ceFEPime (MAXIPIME) IV 200 mL/hr at 07/04/22 1500   diltiazem (CARDIZEM) infusion 15 mg/hr (07/04/22 1500)     LOS: 1 day   Critical Care Procedure Note Authorized and Performed by: Murvin Natal MD  Total Critical Care time:  54 mins Due to a high probability of clinically significant, life threatening deterioration, the patient required my highest level of preparedness to intervene emergently and I personally spent this critical care time directly and personally managing the patient.  This critical care time included obtaining a history; examining the patient, pulse oximetry; ordering and review of studies; arranging urgent treatment with development of a management plan; evaluation of patient's response of treatment; frequent reassessment; and discussions with other providers.  This critical care time was performed to assess and manage the high probability of imminent and life threatening deterioration that could result in multi-organ failure.  It was exclusive of separately billable procedures and treating other patients and teaching time.    Irwin Brakeman, MD How to contact the [facility] Attending or Consulting provider Wing or covering provider during after hours [place],  for this patient?  Check the care team in [facility] and look for a) attending/consulting [facility] provider listed and b) the [facility] team listed Log into [email or web address] and use [place]'s universal password to access. If you do not have the password, please contact the hospital operator. Locate the [facility] provider you are looking for under [facility] and page to a number that you can be directly reached. If you still have difficulty reaching the provider, please page the [facility] (Director on Call) for the Hospitalists listed on amion for assistance.  07/04/2022, 6:23 PM

## 2022-07-04 NOTE — Progress Notes (Signed)
Pt taken off BiPAP and placed on 6L [place], tolerating well, eating breakfast. Pt states she doesn't wear any oxygen at baseline. Will continue to wean as tolerated.

## 2022-07-04 NOTE — Progress Notes (Signed)
Pt placed back on BiPAP due to worsening SOB and decreasing oxygen saturation. Oxygen levels improved.

## 2022-07-04 NOTE — Hospital Course (Addendum)
67 y.o. female with medical history significant for   Atria fibrillation, hypertension, diabetes mellitus, breast cancer. Patient was brought to the ED reports of nausea vomiting over the past 3 days, and generalized weakness.  Went to the urgent care and was referred to the ED. At the time of my evaluation, patient is a bit confused and denies symptoms previously reported, answers a few questions.  Patient's grand niece-[name] is at bedside and provides most of the history.  She reports that over the past 3 days, patient has had 1-2 episodes of vomiting daily.  No diarrhea.  No abdominal pain.  Blood sugars were in the 400s yesterday.  Patient denies chest pain or urinary symptoms.  It is unknown if she has been taking her medications over the past few days.  Patient's nurse reported today for in the ED, patient's speech has become a bit confused, like talking about her mother who died in 2017.   ED Course: Tmax 99.9.  Heart rate up to 150s.  Respiratory rate 18- 33.  Blood pressure systolic 102 -135.  Potassium 3.1.  Magnesium 1.1.  Chest x-ray clear.  WBC 17.9.  Creatinine 1.4.  Cardizem drip started with improvement in heart rate.  1 L bolus given.  Magnesium and potassium supplementation started.  Hospitalist to admit for atrial fibrillation with RVR, possible encephalopathy.  Patient was initially started on vancomycin, cefepime, metronidazole.  Blood cultures and urine cultures grew E. coli.  Her antibiotics were de-escalated to ceftriaxone.  Chest x-ray showed right lower lobe opacity.  She was treated for pneumonia.  After fluid resuscitation, the patient became fluid overloaded.  She was started on IV Lasix.  She developed atrial fibrillation with RVR.  She was initially started on diltiazem drip.  This was transitioned to amiodarone secondary to soft blood pressure.   As her sepsis physiology resolved, her atrial fibrillation improved.

## 2022-07-04 NOTE — Progress Notes (Signed)
Pt continues to be hypotensive despite, fluid bolus'. Notified Dr. Earnest Conroy pt is asymptomatic at this time will continue to monitor.

## 2022-07-04 NOTE — Progress Notes (Signed)
Cardizem gtt had to be started back due to increasing HR. Pt also spiked a fever of 103.6 rectally. MD made aware, cooling blanket applied. Will continue to monitor.

## 2022-07-04 NOTE — Progress Notes (Signed)
Pt not able to finish 2nd tablet of tylenol, partially dissolved in pts mouth before bipap was place.

## 2022-07-04 NOTE — Progress Notes (Signed)
Date and time results received: 07/04/22 1000  Test: Blood Cultures, Both Sets Critical Value: Anaerobic Bottles x2: gram negative rods  Name of Provider Notified: Dr. Wynetta Emery  Orders Received? Or Actions Taken?: Patient currently receiving antibiotics and fluids for infection.

## 2022-07-05 ENCOUNTER — Inpatient Hospital Stay (HOSPITAL_COMMUNITY): Payer: 59

## 2022-07-05 ENCOUNTER — Other Ambulatory Visit: Payer: Self-pay

## 2022-07-05 DIAGNOSIS — I5031 Acute diastolic (congestive) heart failure: Secondary | ICD-10-CM

## 2022-07-05 DIAGNOSIS — G9341 Metabolic encephalopathy: Secondary | ICD-10-CM | POA: Diagnosis not present

## 2022-07-05 DIAGNOSIS — A4151 Sepsis due to Escherichia coli [E. coli]: Secondary | ICD-10-CM | POA: Diagnosis not present

## 2022-07-05 DIAGNOSIS — J181 Lobar pneumonia, unspecified organism: Secondary | ICD-10-CM | POA: Insufficient documentation

## 2022-07-05 DIAGNOSIS — N179 Acute kidney failure, unspecified: Secondary | ICD-10-CM | POA: Diagnosis not present

## 2022-07-05 DIAGNOSIS — R7881 Bacteremia: Secondary | ICD-10-CM | POA: Insufficient documentation

## 2022-07-05 DIAGNOSIS — I4891 Unspecified atrial fibrillation: Secondary | ICD-10-CM | POA: Diagnosis not present

## 2022-07-05 LAB — COMPREHENSIVE METABOLIC PANEL
ALT: 16 U/L (ref 0–44)
AST: 32 U/L (ref 15–41)
Albumin: 2.4 g/dL — ABNORMAL LOW (ref 3.5–5.0)
Alkaline Phosphatase: 59 U/L (ref 38–126)
Anion gap: 10 (ref 5–15)
BUN: 24 mg/dL — ABNORMAL HIGH (ref 8–23)
CO2: 19 mmol/L — ABNORMAL LOW (ref 22–32)
Calcium: 8.4 mg/dL — ABNORMAL LOW (ref 8.9–10.3)
Chloride: 106 mmol/L (ref 98–111)
Creatinine, Ser: 1.03 mg/dL — ABNORMAL HIGH (ref 0.44–1.00)
GFR, Estimated: 59 mL/min — ABNORMAL LOW (ref >60)
Glucose, Bld: 133 mg/dL — ABNORMAL HIGH (ref 70–99)
Potassium: 3.8 mmol/L (ref 3.5–5.1)
Sodium: 135 mmol/L (ref 135–145)
Total Bilirubin: 1.3 mg/dL — ABNORMAL HIGH (ref 0.3–1.2)
Total Protein: 7.3 g/dL (ref 6.5–8.1)

## 2022-07-05 LAB — CBC WITH DIFFERENTIAL/PLATELET
Abs Immature Granulocytes: 0.04 10*3/uL (ref 0.00–0.07)
Basophils Absolute: 0 10*3/uL (ref 0.0–0.1)
Basophils Relative: 0 %
Eosinophils Absolute: 0.1 10*3/uL (ref 0.0–0.5)
Eosinophils Relative: 1 %
HCT: 30.2 % — ABNORMAL LOW (ref 36.0–46.0)
Hemoglobin: 10.1 g/dL — ABNORMAL LOW (ref 12.0–15.0)
Immature Granulocytes: 0 %
Lymphocytes Relative: 15 %
Lymphs Abs: 1.5 10*3/uL (ref 0.7–4.0)
MCH: 30.8 pg (ref 26.0–34.0)
MCHC: 33.4 g/dL (ref 30.0–36.0)
MCV: 92.1 fL (ref 80.0–100.0)
Monocytes Absolute: 0.6 10*3/uL (ref 0.1–1.0)
Monocytes Relative: 6 %
Neutro Abs: 7.6 10*3/uL (ref 1.7–7.7)
Neutrophils Relative %: 78 %
Platelets: 148 10*3/uL — ABNORMAL LOW (ref 150–400)
RBC: 3.28 MIL/uL — ABNORMAL LOW (ref 3.87–5.11)
RDW: 13.9 % (ref 11.5–15.5)
WBC: 9.8 10*3/uL (ref 4.0–10.5)
nRBC: 0 % (ref 0.0–0.2)

## 2022-07-05 LAB — GLUCOSE, CAPILLARY
Glucose-Capillary: 108 mg/dL — ABNORMAL HIGH (ref 70–99)
Glucose-Capillary: 124 mg/dL — ABNORMAL HIGH (ref 70–99)
Glucose-Capillary: 127 mg/dL — ABNORMAL HIGH (ref 70–99)
Glucose-Capillary: 130 mg/dL — ABNORMAL HIGH (ref 70–99)
Glucose-Capillary: 153 mg/dL — ABNORMAL HIGH (ref 70–99)
Glucose-Capillary: 154 mg/dL — ABNORMAL HIGH (ref 70–99)
Glucose-Capillary: 167 mg/dL — ABNORMAL HIGH (ref 70–99)

## 2022-07-05 LAB — ECHOCARDIOGRAM COMPLETE
Area-P 1/2: 5.54 cm2
Height: 64 in
MV M vel: 3.66 m/s
MV Peak grad: 53.4 mmHg
S' Lateral: 3.2 cm
Weight: 2366.86 oz

## 2022-07-05 MED ORDER — POTASSIUM CHLORIDE CRYS ER 20 MEQ PO TBCR
20.0000 meq | EXTENDED_RELEASE_TABLET | Freq: Once | ORAL | Status: AC
  Administered 2022-07-05: 20 meq via ORAL
  Filled 2022-07-05: qty 1

## 2022-07-05 MED ORDER — OXYCODONE-ACETAMINOPHEN 5-325 MG PO TABS
1.0000 | ORAL_TABLET | ORAL | Status: DC | PRN
  Administered 2022-07-05 – 2022-07-06 (×2): 1 via ORAL
  Filled 2022-07-05 (×3): qty 1

## 2022-07-05 MED ORDER — AMIODARONE LOAD VIA INFUSION
150.0000 mg | Freq: Once | INTRAVENOUS | Status: AC
  Administered 2022-07-05: 150 mg via INTRAVENOUS
  Filled 2022-07-05: qty 83.34

## 2022-07-05 MED ORDER — DILTIAZEM HCL-DEXTROSE 125-5 MG/125ML-% IV SOLN (PREMIX): 5.0000 mg/h | INTRAVENOUS | Status: DC

## 2022-07-05 MED ORDER — SODIUM CHLORIDE 0.9 % IV SOLN
2.0000 g | INTRAVENOUS | Status: DC
  Administered 2022-07-05 – 2022-07-10 (×6): 2 g via INTRAVENOUS
  Filled 2022-07-05 (×6): qty 20

## 2022-07-05 MED ORDER — AMIODARONE HCL IN DEXTROSE 360-4.14 MG/200ML-% IV SOLN
30.0000 mg/h | INTRAVENOUS | Status: DC
  Administered 2022-07-05 – 2022-07-07 (×4): 30 mg/h via INTRAVENOUS
  Filled 2022-07-05 (×4): qty 200

## 2022-07-05 MED ORDER — MELATONIN 3 MG PO TABS
6.0000 mg | ORAL_TABLET | Freq: Once | ORAL | Status: AC
  Administered 2022-07-05: 6 mg via ORAL
  Filled 2022-07-05: qty 2

## 2022-07-05 MED ORDER — FUROSEMIDE 10 MG/ML IJ SOLN
40.0000 mg | Freq: Once | INTRAMUSCULAR | Status: AC
  Administered 2022-07-05: 40 mg via INTRAVENOUS
  Filled 2022-07-05: qty 4

## 2022-07-05 MED ORDER — AMIODARONE HCL IN DEXTROSE 360-4.14 MG/200ML-% IV SOLN
60.0000 mg/h | INTRAVENOUS | Status: DC
  Administered 2022-07-05 (×2): 60 mg/h via INTRAVENOUS
  Filled 2022-07-05: qty 200

## 2022-07-05 MED ORDER — POTASSIUM CHLORIDE 10 MEQ/100ML IV SOLN: 10.0000 meq | INTRAVENOUS | Status: DC

## 2022-07-05 MED ORDER — DILTIAZEM LOAD VIA INFUSION: 10.0000 mg | Freq: Once | INTRAVENOUS | Status: DC

## 2022-07-05 NOTE — Progress Notes (Signed)
PROGRESS NOTE  Lisa Thornton [id number] DOB: 11-06-1955 DOA: 07/03/2022 PCP: Lindell Spar, MD  Brief History:  67 y.o. female with medical history significant for   Atria fibrillation, hypertension, diabetes mellitus, breast cancer. Patient was brought to the ED reports of nausea vomiting over the past 3 days, and generalized weakness.  Went to the urgent care and was referred to the ED. At the time of my evaluation, patient is a bit confused and denies symptoms previously reported, answers a few questions.  Patient's grand niece-[name] is at bedside and provides most of the history.  She reports that over the past 3 days, patient has had 1-2 episodes of vomiting daily.  No diarrhea.  No abdominal pain.  Blood sugars were in the 400s yesterday.  Patient denies chest pain or urinary symptoms.  It is unknown if she has been taking her medications over the past few days.  Patient's nurse reported today for in the ED, patient's speech has become a bit confused, like talking about her mother who died in 07/14/2015.   ED Course: Tmax 99.9.  Heart rate up to 150s.  Respiratory rate 18- 33.  Blood pressure systolic [id number] -A999333.  Potassium 3.1.  Magnesium 1.1.  Chest x-ray clear.  WBC 17.9.  Creatinine 1.4.  Cardizem drip started with improvement in heart rate.  1 L bolus given.  Magnesium and potassium supplementation started.  Hospitalist to admit for atrial fibrillation with RVR, possible encephalopathy.   Assessment/Plan: Acute respiratory failure with hypoxia -Secondary to pulmonary edema and pneumonia -Currently on BiPAP -Wean oxygen as tolerated  Lobar pneumonia -Personally reviewed chest x-ray--increased interstitial prominence, right lower lobe opacity, left retrocardiac opacity -PCT 69.47 -Continue ceftriaxone  Severe sepsis -Secondary to pneumonia bacteremia -Present on admission -Meeting severe criteria with tachycardia heart rates up to 150s, ranging from 180-158, tachypnea  respirate rate 18-33, leukocytosis of 17.9.  -Lactic acid peaked 2.0  Atrial fibrillation with RVR, type unspecified -Rate controlled off diltiazem drip -Continue metoprolol -Continue apixaban  AKI -Secondary to sepsis and hypotension -Baseline creatinine 0.8-1.0 -Presented with serum creatinine 1.40  E. coli bacteremia -Discontinue vancomycin and cefepime -Start ceftriaxone  E. coli UTI -UA 11-20 WBC -Continue ceftriaxone  Acute metabolic Encephalopathy -due to infectious process -further work up if no improvement  Uncontrolled diabetes mellitus type 2 with hyperglycemia -10/25/2022 hemoglobin A1c 7.8 -NovoLog sliding scale  Left breast cancer -Patient follows Dr. Delton Coombes -stage IIIb poorly differentiated left breast cancer, status post left modified radical mastectomy, chemotherapy. -Resume anastrozole  Hypertension -Currently on diltiazem drip -Continue metoprolol      Family Communication:  no Family at bedside  Consultants:  none  Code Status:  FULL   DVT Prophylaxis: apixaban   Procedures: As Listed in Progress Note Above  Antibiotics: Cefepime 4/1>>4/3 Vanc 4/1 Ceftriaxone 4/3>>   The patient is critically ill with multiple organ systems failure and requires high complexity decision making for assessment and support, frequent evaluation and titration of therapies, application of advanced monitoring technologies and extensive interpretation of multiple databases.  Critical care time - 35 mins.      Subjective: Patient states that she is breathing better than yesterday.  She denies any chest pain, nausea, vomiting, diarrhea, abdominal pain.  She has some pain around her face near the BiPAP mask.  Objective: Vitals:   07/05/22 0700 07/05/22 0715 07/05/22 0731 07/05/22 0828  BP: (!) 102/44 (!) 110/38  (!) 97/48  Pulse: 98 (!)  111  95  Resp: (!) 21 19    Temp:   99.7 F (37.6 C)   TempSrc:   Rectal   SpO2: 93% 95%  100%  Weight:       Height:        Intake/Output Summary (Last 24 hours) at 07/05/2022 0902 Last data filed at 07/05/2022 0644 Gross per 24 hour  Intake 3248.51 ml  Output 800 ml  Net 2448.51 ml   Weight change:  Exam:  General:  Pt is alert, follows commands appropriately, not in acute distress HEENT: No icterus, No thrush, No neck mass, [place]/AT Cardiovascular: RRR, S1/S2, no rubs, no gallops Respiratory: bibasilar rales. No wheeze.  Good air movement Abdomen: Soft/+BS, non tender, non distended, no guarding Extremities: 2 + LE edema, No lymphangitis, No petechiae, No rashes, no synovitis   Data Reviewed: I have personally reviewed following labs and imaging studies Basic Metabolic Panel: Recent Labs  Lab 07/03/22 1410 07/04/22 0320 07/05/22 0508  NA 131* 133* 135  K 3.1* 4.3 3.8  CL 93* 104 106  CO2 24 21* 19*  GLUCOSE 193* 205* 133*  BUN 35* 30* 24*  CREATININE 1.40* 1.29* 1.03*  CALCIUM 8.9 7.8* 8.4*  MG 1.1* 2.1  --    Liver Function Tests: Recent Labs  Lab 07/03/22 1410 07/05/22 0508  AST 45* 32  ALT 22 16  ALKPHOS 51 59  BILITOT 0.9 1.3*  PROT 8.1 7.3  ALBUMIN 2.9* 2.4*   No results for input(s): "LIPASE", "AMYLASE" in the last 168 hours. No results for input(s): "AMMONIA" in the last 168 hours. Coagulation Profile: No results for input(s): "INR", "PROTIME" in the last 168 hours. CBC: Recent Labs  Lab 07/03/22 1410 07/04/22 0320 07/05/22 0508  WBC 17.9* 11.4* 9.8  NEUTROABS 13.6*  --  7.6  HGB 10.7* 9.2* 10.1*  HCT 31.2* 27.5* 30.2*  MCV 90.4 92.3 92.1  PLT 156 144* 148*   Cardiac Enzymes: No results for input(s): "CKTOTAL", "CKMB", "CKMBINDEX", "TROPONINI" in the last 168 hours. BNP: Invalid input(s): "POCBNP" CBG: Recent Labs  Lab 07/04/22 1612 07/04/22 2004 07/05/22 0006 07/05/22 0418 07/05/22 0729  GLUCAP 179* 162* 153* 108* 127*   HbA1C: No results for input(s): "HGBA1C" in the last 72 hours. Urine analysis:    Component Value Date/Time    COLORURINE YELLOW 07/03/2022 2057   APPEARANCEUR HAZY (A) 07/03/2022 2057   APPEARANCEUR Turbid (A) 04/26/2022 1413   LABSPEC 1.016 07/03/2022 2057   PHURINE 5.0 07/03/2022 2057   GLUCOSEU NEGATIVE 07/03/2022 2057   HGBUR MODERATE (A) 07/03/2022 2057   BILIRUBINUR NEGATIVE 07/03/2022 2057   BILIRUBINUR CANCELED 04/26/2022 1413   KETONESUR NEGATIVE 07/03/2022 2057   PROTEINUR 30 (A) 07/03/2022 2057   UROBILINOGEN 0.2 11/14/2021 1443   NITRITE POSITIVE (A) 07/03/2022 2057   LEUKOCYTESUR SMALL (A) 07/03/2022 2057   Sepsis Labs: @LABRCNTIP (procalcitonin:4,lacticidven:4) ) Recent Results (from the past 240 hour(s))  Resp panel by RT-PCR (RSV, Flu A&B, Covid) Anterior Nasal Swab     Status: None   Collection Time: 07/03/22  6:56 PM   Specimen: Anterior Nasal Swab  Result Value Ref Range Status   SARS Coronavirus 2 by RT PCR NEGATIVE NEGATIVE Final    Comment: (NOTE) SARS-CoV-2 target nucleic acids are NOT DETECTED.  The SARS-CoV-2 RNA is generally detectable in upper respiratory specimens during the acute phase of infection. The lowest concentration of SARS-CoV-2 viral copies this assay can detect is 138 copies/mL. A negative result does not preclude SARS-Cov-2 infection and  should not be used as the sole basis for treatment or other patient management decisions. A negative result may occur with  improper specimen collection/handling, submission of specimen other than nasopharyngeal swab, presence of viral mutation(s) within the areas targeted by this assay, and inadequate number of viral copies(<138 copies/mL). A negative result must be combined with clinical observations, patient history, and epidemiological information. The expected result is Negative.  Fact Sheet for Patients:  EntrepreneurPulse.com.au  Fact Sheet for Healthcare Providers:  IncredibleEmployment.be  This test is no t yet approved or cleared by the [place] FDA and   has been authorized for detection and/or diagnosis of SARS-CoV-2 by FDA under an Emergency Use Authorization (EUA). This EUA will remain  in effect (meaning this test can be used) for the duration of the COVID-19 declaration under Section 564(b)(1) of the Act, 21 U.S.C.section 360bbb-3(b)(1), unless the authorization is terminated  or revoked sooner.       Influenza A by PCR NEGATIVE NEGATIVE Final   Influenza B by PCR NEGATIVE NEGATIVE Final    Comment: (NOTE) The Xpert Xpress SARS-CoV-2/FLU/RSV plus assay is intended as an aid in the diagnosis of influenza from Nasopharyngeal swab specimens and should not be used as a sole basis for treatment. Nasal washings and aspirates are unacceptable for Xpert Xpress SARS-CoV-2/FLU/RSV testing.  Fact Sheet for Patients: EntrepreneurPulse.com.au  Fact Sheet for Healthcare Providers: IncredibleEmployment.be  This test is not yet approved or cleared by the [place] FDA and has been authorized for detection and/or diagnosis of SARS-CoV-2 by FDA under an Emergency Use Authorization (EUA). This EUA will remain in effect (meaning this test can be used) for the duration of the COVID-19 declaration under Section 564(b)(1) of the Act, 21 U.S.C. section 360bbb-3(b)(1), unless the authorization is terminated or revoked.     Resp Syncytial Virus by PCR NEGATIVE NEGATIVE Final    Comment: (NOTE) Fact Sheet for Patients: EntrepreneurPulse.com.au  Fact Sheet for Healthcare Providers: IncredibleEmployment.be  This test is not yet approved or cleared by the [place] FDA and has been authorized for detection and/or diagnosis of SARS-CoV-2 by FDA under an Emergency Use Authorization (EUA). This EUA will remain in effect (meaning this test can be used) for the duration of the COVID-19 declaration under Section 564(b)(1) of the Act, 21 U.S.C. section 360bbb-3(b)(1),  unless the authorization is terminated or revoked.  Performed at [facility], [address] [place], [place] [address]   Culture, blood (Routine X 2) w Reflex to ID Panel     Status: None (Preliminary result)   Collection Time: 07/03/22  9:11 PM   Specimen: BLOOD RIGHT HAND  Result Value Ref Range Status   Specimen Description   Final    BLOOD RIGHT HAND Performed at [facility], [place] [address], [place], [place] [address]    Special Requests   Final    BOTTLES DRAWN AEROBIC AND ANAEROBIC Blood Culture adequate volume Performed at [facility], [address] [place], [place] [address]    Culture  Setup Time   Final    GRAM NEGATIVE RODS Gram Stain Report Called to,Read Back By and Verified With: SHELTON, A. @ [phone] 07/04/2022 BY FRATTO, A. IN BOTH AEROBIC AND ANAEROBIC BOTTLES CRITICAL RESULT CALLED TO, READ BACK BY AND VERIFIED WITH: PHARMD Lu Duffel FB:[phone] BY JRS Performed at [facility], [place] [address] [place], [place] [address]    Culture GRAM NEGATIVE RODS  Final   Report Status PENDING  Incomplete  Blood Culture ID Panel (Reflexed)     Status: Abnormal   Collection Time: 07/03/22  9:11 PM  Result Value Ref Range Status   Enterococcus faecalis NOT DETECTED NOT DETECTED Final   Enterococcus Faecium NOT DETECTED NOT DETECTED Final   Listeria monocytogenes NOT DETECTED NOT DETECTED Final   Staphylococcus species NOT DETECTED NOT DETECTED Final   Staphylococcus aureus (BCID) NOT DETECTED NOT DETECTED Final   Staphylococcus epidermidis NOT DETECTED NOT DETECTED Final   Staphylococcus lugdunensis NOT DETECTED NOT DETECTED Final   Streptococcus species NOT DETECTED NOT DETECTED Final   Streptococcus agalactiae NOT DETECTED NOT DETECTED Final   Streptococcus pneumoniae NOT DETECTED NOT DETECTED Final   Streptococcus pyogenes NOT DETECTED NOT DETECTED Final   A.calcoaceticus-baumannii NOT DETECTED NOT DETECTED Final   Bacteroides fragilis NOT DETECTED  NOT DETECTED Final   Enterobacterales DETECTED (A) NOT DETECTED Final    Comment: Enterobacterales represent a large order of gram negative bacteria, not a single organism. CRITICAL RESULT CALLED TO, READ BACK BY AND VERIFIED WITH: PHARMD DEVAN MITCHELL FB:[phone] BY JRS    Enterobacter cloacae complex NOT DETECTED NOT DETECTED Final   Escherichia coli DETECTED (A) NOT DETECTED Final    Comment: CRITICAL RESULT CALLED TO, READ BACK BY AND VERIFIED WITH: PHARMD DEVAN MITCHELL FB:[phone] BY JRS    Klebsiella aerogenes NOT DETECTED NOT DETECTED Final   Klebsiella oxytoca NOT DETECTED NOT DETECTED Final   Klebsiella pneumoniae NOT DETECTED NOT DETECTED Final   Proteus species NOT DETECTED NOT DETECTED Final   Salmonella species NOT DETECTED NOT DETECTED Final   Serratia marcescens NOT DETECTED NOT DETECTED Final   Haemophilus influenzae NOT DETECTED NOT DETECTED Final   Neisseria meningitidis NOT DETECTED NOT DETECTED Final   Pseudomonas aeruginosa NOT DETECTED NOT DETECTED Final   Stenotrophomonas maltophilia NOT DETECTED NOT DETECTED Final   Candida albicans NOT DETECTED NOT DETECTED Final   Candida auris NOT DETECTED NOT DETECTED Final   Candida glabrata NOT DETECTED NOT DETECTED Final   Candida krusei NOT DETECTED NOT DETECTED Final   Candida parapsilosis NOT DETECTED NOT DETECTED Final   Candida tropicalis NOT DETECTED NOT DETECTED Final   Cryptococcus neoformans/gattii NOT DETECTED NOT DETECTED Final   CTX-M ESBL NOT DETECTED NOT DETECTED Final   Carbapenem resistance IMP NOT DETECTED NOT DETECTED Final   Carbapenem resistance KPC NOT DETECTED NOT DETECTED Final   Carbapenem resistance NDM NOT DETECTED NOT DETECTED Final   Carbapenem resist OXA 48 LIKE NOT DETECTED NOT DETECTED Final   Carbapenem resistance VIM NOT DETECTED NOT DETECTED Final    Comment: Performed at [facility], [address] [place], [place] [address]  Culture, blood (Routine X 2) w Reflex to ID  Panel     Status: None (Preliminary result)   Collection Time: 07/03/22  9:13 PM   Specimen: BLOOD LEFT ARM  Result Value Ref Range Status   Specimen Description   Final    BLOOD LEFT ARM Performed at [facility], [place] [address] [place], [place] [address]    Special Requests   Final    BOTTLES DRAWN AEROBIC AND ANAEROBIC Blood Culture adequate volume Performed at [facility], [address] [place],  [address]    Culture  Setup Time   Final    GRAM NEGATIVE RODS Gram Stain Report Called to,Read Back By and Verified With: SHELTON, A. @[phone]  07/04/2022 BY FRATTO, A. IN BOTH AEROBIC AND ANAEROBIC BOTTLES CRITICAL VALUE NOTED.  VALUE IS  CONSISTENT WITH PREVIOUSLY REPORTED AND CALLED VALUE. Performed at [facility], [place] [address] [place], [place] [address]    Culture GRAM NEGATIVE RODS  Final   Report Status PENDING  Incomplete  MRSA Next Gen by PCR, Nasal     Status: None   Collection Time: 07/03/22  9:18 PM   Specimen: Nasal Mucosa; Nasal Swab  Result Value Ref Range Status   MRSA by PCR Next Gen NOT DETECTED NOT DETECTED Final    Comment: (NOTE) The GeneXpert MRSA Assay (FDA approved for NASAL specimens only), is one component of a comprehensive MRSA colonization surveillance program. It is not intended to diagnose MRSA infection nor to guide or monitor treatment for MRSA infections. Test performance is not FDA approved in patients less than 50 years old. Performed at [facility], [address], [place], [place] [address]   Urine Culture     Status: Abnormal (Preliminary result)   Collection Time: 07/03/22  9:55 PM   Specimen: Urine, Clean Catch  Result Value Ref Range Status   Specimen Description   Final    URINE, CLEAN CATCH Performed at [facility], [address] [place], [place] [address]    Special Requests   Final    NONE Performed at [facility], [address] [place], [place] [address]    Culture (A)  Final    >=100,000 COLONIES/mL  GRAM NEGATIVE RODS SUSCEPTIBILITIES TO FOLLOW Performed at [facility], [place] [address] [place], [place] [address]    Report Status PENDING  Incomplete     Scheduled Meds:  anastrozole  1 mg Oral Daily   apixaban  5 mg Oral BID   Chlorhexidine Gluconate Cloth  6 each Topical Daily   furosemide  40 mg Intravenous Once   insulin aspart  0-9 Units Subcutaneous Q4H   metoprolol tartrate  50 mg Oral BID   mouth rinse  15 mL Mouth Rinse 4 times per day   Continuous Infusions:  cefTRIAXone (ROCEPHIN)  IV     diltiazem (CARDIZEM) infusion Stopped (07/05/22 0146)    Procedures/Studies: DG CHEST PORT 1 VIEW  Result Date: 07/05/2022 CLINICAL DATA:  Shortness of breath EXAM: PORTABLE CHEST 1 VIEW COMPARISON:  Yesterday FINDINGS: Cardiomegaly. Interstitial opacity with streaky density in the lower lungs. Small right pleural effusion. No pneumothorax. Porta catheter with tip at the SVC. IMPRESSION: Interstitial and airspace opacity at the bases that could be infection and/or edema. Aeration is worsened from yesterday. Electronically Signed   By: Jorje Guild M.D.   On: 07/05/2022 07:56   MM 3D SCREEN BREAST UNI RIGHT  Result Date: 07/04/2022 CLINICAL DATA:  Screening. History left mastectomy 2019. EXAM: DIGITAL SCREENING UNILATERAL RIGHT MAMMOGRAM WITH CAD AND TOMOSYNTHESIS TECHNIQUE: Right screening digital craniocaudal and mediolateral oblique mammograms were obtained. Right screening digital breast tomosynthesis was performed. The images were evaluated with computer-aided detection. COMPARISON:  Previous exam(s). ACR Breast Density Category b: There are scattered areas of fibroglandular density. FINDINGS: There are no findings suspicious for malignancy. Note that a request was initially made to repeat the right CC to obtain more posterior tissue however the patient is currently hospitalized and located in the intensive care unit and therefore would not be able to obtain any additional  mammography any time soon. IMPRESSION: No mammographic evidence of malignancy. A result letter of this screening mammogram will be mailed directly to the patient. RECOMMENDATION: Screening mammogram in one year. (Code:[id number]) BI-RADS CATEGORY  1: Negative. Electronically Signed  By: Everlean Alstrom M.D.   On: 07/04/2022 15:57   DG Chest Port 1 View  Result Date: 07/04/2022 CLINICAL DATA:  67 year old female with history of dyspnea. EXAM: PORTABLE CHEST 1 VIEW COMPARISON:  Chest x-ray 07/03/2022. FINDINGS: Right subclavian single-lumen power porta cath with tip terminating in the distal superior vena cava. Areas of interstitial prominence and peribronchial cuffing, most evident in the mid to lower lungs bilaterally. Focal opacity in the retrocardiac region on the left likely reflects atelectasis, although developing airspace consolidation is not excluded. No pleural effusions. No pneumothorax. No evidence of pulmonary edema. Heart size is normal. Upper mediastinal contours are within normal limits. IMPRESSION: 1. The appearance of the chest may suggest an acute bronchitis. 2. There is also a retrocardiac opacity in the medial left base which may reflect an area of atelectasis or developing airspace consolidation. Followup PA and lateral chest X-ray is recommended in 3-4 weeks following trial of antibiotic therapy to ensure resolution and exclude underlying malignancy. Electronically Signed   By: Vinnie Langton M.D.   On: 07/04/2022 05:45   CT Head Wo Contrast  Result Date: 07/03/2022 CLINICAL DATA:  Nausea and vomiting for 3 days, altered level of consciousness EXAM: CT HEAD WITHOUT CONTRAST TECHNIQUE: Contiguous axial images were obtained from the base of the skull through the vertex without intravenous contrast. RADIATION DOSE REDUCTION: This exam was performed according to the departmental dose-optimization program which includes automated exposure control, adjustment of the mA and/or kV according to  patient size and/or use of iterative reconstruction technique. COMPARISON:  None Available. FINDINGS: Brain: Hypodensity in the left occipital periventricular and subcortical white matter consistent with age indeterminate ischemic change. No other signs of acute infarct or hemorrhage. Lateral ventricles and midline structures are unremarkable. There are no acute extra-axial fluid collections. No mass effect. Vascular: Atherosclerosis of the internal carotid arteries. No hyperdense vessel. Skull: Normal. Negative for fracture or focal lesion. Sinuses/Orbits: Prominent mucoperiosteal thickening of the bilateral maxillary sinuses, with a small superimposed gas fluid level on the left. Minimal retained secretions within the sphenoid sinus. Other: None. IMPRESSION: 1. Age indeterminate ischemic changes within the left occipital periventricular and subcortical white matter, favor chronic. 2. Otherwise no acute intracranial process. 3. Bilateral maxillary sinus disease, with evidence of superimposed acute left maxillary sinusitis. Electronically Signed   By: Randa Ngo M.D.   On: 07/03/2022 19:43   DG Chest 2 View  Result Date: 07/03/2022 CLINICAL DATA:  AFib EXAM: CHEST - 2 VIEW COMPARISON:  CXR 06/27/17 FINDINGS: Right-sided chest port in place with the tip in the upper SVC, unchanged from prior exam. No pleural effusion. No pneumothorax. No focal airspace opacity. Normal cardiac and mediastinal contours. No radiographically apparent displaced rib fractures. Visualized upper abdomen is unremarkable. Vertebral body heights are maintained. IMPRESSION: No focal airspace opacity. Electronically Signed   By: Marin Roberts M.D.   On: 07/03/2022 14:44   DG Bone Density  Result Date: 06/26/2022 EXAM: DUAL X-RAY ABSORPTIOMETRY (DXA) FOR BONE MINERAL DENSITY IMPRESSION: Your patient Makailynn Hurlock completed a BMD test on 06/26/2022 using the [place] (software version: 14.10) manufactured by [employer] The following summarizes the results of our evaluation. Technologist: [facility] PATIENT BIOGRAPHICAL: Name: Panda, Symonette Patient ID: [id number] Birth Date: 09-28-1955 Height: 64.0 in. Gender: Female Exam Date: 06/26/2022 Weight: 151.0 lbs. Indications: Hx Breast Ca, Caucasian, Secondary Osteoporosis, Follow up Osteopenia, Post Menopausal Fractures: Treatments: Calcium, Anastrozole, Vitamin D DENSITOMETRY RESULTS: Site  Region     Measured Date Measured Age WHO Classification Young Adult T-score BMD         %Change vs. Previous Significant Change (*) DualFemur Neck Left 06/26/[age] 68.3 Osteopenia -1.9 0.773 g/cm2 -5.3% Yes DualFemur Neck Left 06/18/2020 66.2 Osteopenia -1.6 0.816 g/cm2 2.6% - DualFemur Neck Left 1/07/2018 64.0 Osteopenia -1.8 0.795 g/cm2 - - DualFemur Total Mean 06/26/2022 68.3 Osteopenia -1.3 0.838 g/cm2 0.0% - DualFemur Total Mean 06/18/2020 66.2 Osteopenia -1.3 0.838 g/cm2 -2.9% Yes DualFemur Total Mean 03/07/2018 64.0 - - 0.863 g/cm2 - - Left Forearm Radius 33% 06/26/2022 68.3 Osteopenia -1.4 0.616 g/cm2 -7.8% Yes Left Forearm Radius 33% 06/18/2020 66.2 Normal -0.6 0.668 g/cm2 -9.6% Yes Left Forearm Radius 33% 03/07/2018 64.0 Normal 0.4 0.739 g/cm2 - - ASSESSMENT: The BMD measured at Femur Neck Left is 0.773 g/cm2 with a T-score of -1.9. This patient is considered osteopenic according to [facility]) criteria. The scan quality is good. Compared with the prior study on 06/18/20, the BMD of the total mean shows no statistically significant change, however, the lt. forearm shows a significant decrease. Lumbar spine was excluded due to advanced degenerative changes. [facility]) criteria for post-menopausal, Caucasian Women: Normal:       T-score at or above -1 SD Osteopenia:   T-score between -1 and -2.5 SD Osteoporosis: T-score at or below -2.5 SD RECOMMENDATIONS: 1. All patients should optimize calcium and vitamin D intake. 2. Consider  FDA-approved medical therapies in postmenopausal women and med aged 15 years and older, based on the following: a. A hip or vertebral (clinical or morphometric) fracture b. T-score< -2.5 at the femoral neck or spine after appropriate evaluation to exclude secondary causes c. Low bone mass (T-score between -1.0 and -2.5 at the femoral neck or spine) and a 10-year probability of a hip fracture > 3% or a 10-year probability of a major osteoporosis-related fracture > 20% based on the [place]-adapted WHO algorithm d. Clinician judgment and/or patient preferences may indicate treatment for people with 10-year fracture probabilities above or below these levels FOLLOW-UP: Patients with diagnosis of osteoporsis or at high risk for fracture should have regular bone mineral density tests. For patients eligible for Medicare, routine testing is allowed once every 2 years. The testing frequency can be increased to one year for patients who have rapidly progressing disease, those who are receiving or discontinuing medical therapy to restore bone mass, or have additional risk factors. I have reviewed this report, and agree with the above findings. [facility] Your patient ARLINA WHATLEY completed a FRAX assessment on 06/26/2022 using the [place] (analysis version: 14.10) manufactured by [employer]. The following summarizes the results of our evaluation. PATIENT BIOGRAPHICAL: Name: Komal, Failing Patient ID: [id number] Birth Date: 04-13-1955 Height:    64.0 in. [age]:     [age]    [age]       Weight:    151.0 lbs. Ethnicity:  White                            Exam Date: 06/26/2022 FRAX* RESULTS:  (version: 3.5) 10-year Probability of Fracture1 Major Osteoporotic Fracture2 Hip Fracture 11.3% 1.9% Population: [place] (Caucasian) Risk Factors: Secondary Osteoporosis Based on DualFemur (Left) Neck BMD 1 -The 10-year probability of fracture may be lower than reported if the patient has received  treatment. 2 -Major Osteoporotic Fracture: Clinical Spine,  Forearm, Hip or Shoulder *FRAX is a Materials engineer of the [employer] for Metabolic Bone Disease, a [place] (WHO) [facility]. ASSESSMENT: The probability of a major osteoporotic fracture is 11.3% within the next ten years. The probability of a hip fracture is 1.9% within the next ten years. Electronically Signed   By: Dorise Bullion III M.D.   On: 06/26/2022 09:39    Orson Eva, DO  [facility]  If 7PM-7AM, please contact night-coverage [email or web address] Password [email or web address] 07/05/2022, 9:02 AM   LOS: 2 days

## 2022-07-05 NOTE — Progress Notes (Signed)
  Transition of Care [facility]) Screening Note   Patient Details  Name: Lisa Thornton Date of Birth: 1955-09-23   Transition of Care [facility]) CM/SW Contact:    Ihor Gully, LCSW Phone Number: 07/05/2022, 3:48 PM    Transition of Care Department [facility]) has reviewed patient and no TOC needs have been identified at this time. We will continue to monitor patient advancement through interdisciplinary progression rounds. If new patient transition needs arise, please place a TOC consult.

## 2022-07-05 NOTE — Progress Notes (Signed)
Secure chat with Dr Tat, states ok to access the [facility] for Amiodarone gtt. Will cancel the PICC order per Dr Tat.

## 2022-07-05 NOTE — Progress Notes (Signed)
PT Cancellation Note  Patient Details Name: Lisa Thornton MRN: [id number] DOB: 1955-09-16   Cancelled Treatment:    Reason Eval/Treat Not Completed: Medical issues which prohibited therapy. Nursing reports patient very short of breath today. Patient with 103.6 temperature and on 10 LPM O2. PT will check back tomorrow for evaluation completion.   Floria Raveling. Hartnett-Rands, MS, PT Per [place] [phone] Pamala Hurry  Hartnett-Rands 07/05/2022, 12:44 PM

## 2022-07-05 NOTE — Progress Notes (Signed)
  Echocardiogram 2D Echocardiogram has been performed.  Lisa Thornton 07/05/2022, 3:20 PM

## 2022-07-06 ENCOUNTER — Inpatient Hospital Stay (HOSPITAL_COMMUNITY): Payer: 59

## 2022-07-06 DIAGNOSIS — I4891 Unspecified atrial fibrillation: Secondary | ICD-10-CM | POA: Diagnosis not present

## 2022-07-06 DIAGNOSIS — N179 Acute kidney failure, unspecified: Secondary | ICD-10-CM | POA: Diagnosis not present

## 2022-07-06 DIAGNOSIS — J181 Lobar pneumonia, unspecified organism: Secondary | ICD-10-CM

## 2022-07-06 DIAGNOSIS — A4151 Sepsis due to Escherichia coli [E. coli]: Secondary | ICD-10-CM | POA: Diagnosis not present

## 2022-07-06 LAB — CBC WITH DIFFERENTIAL/PLATELET
Abs Immature Granulocytes: 0.08 10*3/uL — ABNORMAL HIGH (ref 0.00–0.07)
Basophils Absolute: 0 10*3/uL (ref 0.0–0.1)
Basophils Relative: 0 %
Eosinophils Absolute: 0.1 10*3/uL (ref 0.0–0.5)
Eosinophils Relative: 1 %
HCT: 28.7 % — ABNORMAL LOW (ref 36.0–46.0)
Hemoglobin: 9.9 g/dL — ABNORMAL LOW (ref 12.0–15.0)
Immature Granulocytes: 1 %
Lymphocytes Relative: 21 %
Lymphs Abs: 2.3 10*3/uL (ref 0.7–4.0)
MCH: 30.7 pg (ref 26.0–34.0)
MCHC: 34.5 g/dL (ref 30.0–36.0)
MCV: 89.1 fL (ref 80.0–100.0)
Monocytes Absolute: 0.8 10*3/uL (ref 0.1–1.0)
Monocytes Relative: 7 %
Neutro Abs: 7.8 10*3/uL — ABNORMAL HIGH (ref 1.7–7.7)
Neutrophils Relative %: 70 %
Platelets: 167 10*3/uL (ref 150–400)
RBC: 3.22 MIL/uL — ABNORMAL LOW (ref 3.87–5.11)
RDW: 13.9 % (ref 11.5–15.5)
WBC: 11.1 10*3/uL — ABNORMAL HIGH (ref 4.0–10.5)
nRBC: 0 % (ref 0.0–0.2)

## 2022-07-06 LAB — CULTURE, BLOOD (ROUTINE X 2)
Special Requests: ADEQUATE
Special Requests: ADEQUATE

## 2022-07-06 LAB — GLUCOSE, CAPILLARY
Glucose-Capillary: 107 mg/dL — ABNORMAL HIGH (ref 70–99)
Glucose-Capillary: 117 mg/dL — ABNORMAL HIGH (ref 70–99)
Glucose-Capillary: 125 mg/dL — ABNORMAL HIGH (ref 70–99)
Glucose-Capillary: 119 mg/dL — ABNORMAL HIGH (ref 70–99)
Glucose-Capillary: 147 mg/dL — ABNORMAL HIGH (ref 70–99)

## 2022-07-06 LAB — COMPREHENSIVE METABOLIC PANEL
ALT: 16 U/L (ref 0–44)
AST: 26 U/L (ref 15–41)
Albumin: 2.1 g/dL — ABNORMAL LOW (ref 3.5–5.0)
Alkaline Phosphatase: 62 U/L (ref 38–126)
Anion gap: 9 (ref 5–15)
BUN: 20 mg/dL (ref 8–23)
CO2: 24 mmol/L (ref 22–32)
Calcium: 8.7 mg/dL — ABNORMAL LOW (ref 8.9–10.3)
Chloride: 100 mmol/L (ref 98–111)
Creatinine, Ser: 0.93 mg/dL (ref 0.44–1.00)
GFR, Estimated: >60 mL/min (ref >60)
Glucose, Bld: 113 mg/dL — ABNORMAL HIGH (ref 70–99)
Potassium: 3.5 mmol/L (ref 3.5–5.1)
Sodium: 133 mmol/L — ABNORMAL LOW (ref 135–145)
Total Bilirubin: 0.7 mg/dL (ref 0.3–1.2)
Total Protein: 6.9 g/dL (ref 6.5–8.1)

## 2022-07-06 LAB — URINE CULTURE: Culture: >=100000 — AB

## 2022-07-06 LAB — PHOSPHORUS: Phosphorus: 3.4 mg/dL (ref 2.5–4.6)

## 2022-07-06 LAB — MAGNESIUM: Magnesium: 1.4 mg/dL — ABNORMAL LOW (ref 1.7–2.4)

## 2022-07-06 MED ORDER — MAGNESIUM SULFATE 2 GM/50ML IV SOLN
2.0000 g | Freq: Once | INTRAVENOUS | Status: AC
  Administered 2022-07-06: 2 g via INTRAVENOUS
  Filled 2022-07-06: qty 50

## 2022-07-06 MED ORDER — FUROSEMIDE 10 MG/ML IJ SOLN
40.0000 mg | Freq: Every day | INTRAMUSCULAR | Status: DC
  Administered 2022-07-06 – 2022-07-07 (×2): 40 mg via INTRAVENOUS
  Filled 2022-07-06 (×2): qty 4

## 2022-07-06 NOTE — Progress Notes (Signed)
Patient doing well off BIPAP at this time. 97% on 5 lpm. Will monitor through the night and assess if she needs BIPAP or not.

## 2022-07-06 NOTE — TOC Progression Note (Signed)
Transition of Care [facility]) - Progression Note    Patient Details  Name: Lisa Thornton MRN: [id number] Date of Birth: 03/03/1956  Transition of Care [facility]) CM/SW [place], [place] Phone Number: 07/06/2022, 3:59 PM  Clinical Narrative:    PT recommended SNF for pt.  CSW spoke with pt who is agreeable to referrals to SNF.  TOC to follow and work on Ins. Auth. D/C 1-2 days.   Expected Discharge Plan: [place] Barriers to Discharge: Continued Medical Work up  Expected Discharge Plan and Services     Post Acute Care Choice: [place]                                         Social Determinants of Health (SDOH) Interventions SDOH Screenings   Food Insecurity: No Food Insecurity (07/03/2022)  Housing: Low Risk  (07/03/2022)  Transportation Needs: No Transportation Needs (07/03/2022)  Utilities: Not At Risk (07/03/2022)  Alcohol Screen: Low Risk  (03/25/2020)  Depression (PHQ2-9): Low Risk  (06/28/2022)  Financial Resource Strain: Low Risk  (03/29/2021)  Physical Activity: Insufficiently Active (03/29/2021)  Social Connections: Socially Isolated (03/29/2021)  Stress: No Stress Concern Present (03/29/2021)  Tobacco Use: Low Risk  (07/03/2022)    Readmission Risk Interventions     No data to display

## 2022-07-06 NOTE — Plan of Care (Signed)
  Problem: Acute Rehab PT Goals(only PT should resolve) Goal: Pt Will Go Supine/Side To Sit Outcome: Progressing Flowsheets (Taken 07/06/2022 1524) Pt will go Supine/Side to Sit:  with minimal assist  with min guard assist Goal: Patient Will Transfer Sit To/From Stand Outcome: Progressing Flowsheets (Taken 07/06/2022 1524) Patient will transfer sit to/from stand:  with minimal assist  with min guard assist Goal: Pt Will Transfer Bed To Chair/Chair To Bed Outcome: Progressing Flowsheets (Taken 07/06/2022 1524) Pt will Transfer Bed to Chair/Chair to Bed:  min guard assist  with min assist Goal: Pt Will Ambulate Outcome: Progressing Flowsheets (Taken 07/06/2022 1524) Pt will Ambulate:  50 feet  with minimal assist  with rolling walker   3:25 PM, 07/06/22 Lonell Grandchild, MPT Physical Therapist with [facility] [phone] office [phone] mobile phone

## 2022-07-06 NOTE — Progress Notes (Signed)
Pt had much better evening!! WOB has improved, pt wore Bipap all night, pain pill given for Rt hip/leg pain from previous fall at home worked well. Slight fever over night but no significant spike. A-fib controlled with Amio gtt BP stable.

## 2022-07-06 NOTE — Progress Notes (Signed)
Patient continuing to do well off BIPAP at this time.

## 2022-07-06 NOTE — [place] FL2 (Signed)
[place] MEDICAID FL2 LEVEL OF CARE FORM     IDENTIFICATION  Patient Name: Lisa Thornton Birthdate: [birth year] Sex: female Admission Date (Current Location): 07/03/2022  [facility] and [place] Number:  [employer] and Address:  [place] [address], [place]      Provider Number: [id number]  Attending Physician Name and Address:  Orson Eva, MD  Relative Name and Phone Number:       Current Level of Care: Hospital Recommended Level of Care: [place] Prior Approval Number:    Date Approved/Denied:   PASRR Number:    Discharge Plan: SNF    Current Diagnoses: Patient Active Problem List   Diagnosis Date Noted   Sepsis due to Escherichia coli (E. coli) 07/05/2022   Gram-negative bacteremia 07/05/2022   Lobar pneumonia 07/05/2022   Atrial fibrillation with rapid ventricular response 07/03/2022   SIRS (systemic inflammatory response syndrome) [id number]   Acute metabolic encephalopathy [id number]   Electrolyte abnormality 07/03/2022   AKI (acute kidney injury) 07/03/2022   Severe sepsis 07/03/2022   Hypokalemia 03/17/2021   Onychomycosis 03/17/2021   Encounter for general adult medical examination with abnormal findings 11/11/2020   Fatigue 11/11/2020   Acute cystitis without hematuria 12/24/2019   Type 2 diabetes mellitus with diabetic neuropathy, unspecified 10/29/2019   S/P mastectomy, left 12/31/2017   Atrial fibrillation 12/31/2017   Malignant neoplasm of central portion of left female breast    Essential hypertension 06/22/2017   Breast cancer of upper-outer quadrant of left female breast 06/22/2017    Orientation RESPIRATION BLADDER Height & Weight     Self, Time, Situation, Place  Normal Incontinent Weight: 147 lb 14.9 oz (67.1 kg) Height:  5\' 4"  (162.6 cm)  BEHAVIORAL SYMPTOMS/MOOD NEUROLOGICAL BOWEL NUTRITION STATUS      Continent Diet (Regular)  AMBULATORY STATUS COMMUNICATION OF NEEDS Skin    Extensive Assist Verbally Normal                       Personal Care Assistance Level of Assistance  Bathing, Feeding, Dressing Bathing Assistance: Limited assistance Feeding assistance: Independent Dressing Assistance: Limited assistance     Functional Limitations Info  Sight, Hearing, Speech Sight Info: Adequate Hearing Info: Adequate Speech Info: Adequate    SPECIAL CARE FACTORS FREQUENCY  OT (By licensed OT), PT (By licensed PT)     PT Frequency: 5 times weekly OT Frequency: 5 weekly times            Contractures Contractures Info: Not present    Additional Factors Info  Code Status, Allergies Code Status Info: Full Allergies Info: NKA           Current Medications (07/06/2022):  This is the current hospital active medication list Current Facility-Administered Medications  Medication Dose Route Frequency Provider Last Rate Last Admin   acetaminophen (TYLENOL) tablet 650 mg  650 mg Oral Q6H PRN Emokpae, Ejiroghene E, MD   650 mg at 07/06/22 0524   Or   acetaminophen (TYLENOL) suppository 650 mg  650 mg Rectal Q6H PRN Emokpae, Ejiroghene E, MD       amiodarone (NEXTERONE PREMIX) 360-4.14 MG/200ML-% (1.8 mg/mL) IV infusion  30 mg/hr Intravenous Continuous Tat, David, MD 16.67 mL/hr at 07/06/22 1353 30 mg/hr at 07/06/22 1353   anastrozole (ARIMIDEX) tablet 1 mg  1 mg Oral Daily Emokpae, Ejiroghene E, MD   1 mg at 07/06/22 1007   apixaban (ELIQUIS) tablet 5 mg  5 mg  Oral BID Emokpae, Ejiroghene E, MD   5 mg at 07/06/22 1008   cefTRIAXone (ROCEPHIN) 2 g in sodium chloride 0.9 % 100 mL IVPB  2 g Intravenous Q24H Tat, Shanon Brow, MD 200 mL/hr at 07/06/22 1013 2 g at 07/06/22 1013   Chlorhexidine Gluconate Cloth 2 % PADS 6 each  6 each Topical Daily Emokpae, Ejiroghene E, MD   6 each at 07/06/22 1007   furosemide (LASIX) injection 40 mg  40 mg Intravenous Daily Tat, David, MD       insulin aspart (novoLOG) injection 0-9 Units  0-9 Units Subcutaneous Q4H Johnson, Clanford  L, MD   1 Units at 07/06/22 1217   magnesium sulfate IVPB 2 g 50 mL  2 g Intravenous Once Tat, David, MD       metoprolol tartrate (LOPRESSOR) tablet 50 mg  50 mg Oral BID Emokpae, Ejiroghene E, MD   50 mg at 07/06/22 1008   ondansetron (ZOFRAN) tablet 4 mg  4 mg Oral Q6H PRN Emokpae, Ejiroghene E, MD       Or   ondansetron (ZOFRAN) injection 4 mg  4 mg Intravenous Q6H PRN Emokpae, Ejiroghene E, MD   4 mg at 07/06/22 1009   Oral care mouth rinse  15 mL Mouth Rinse 4 times per day Wynetta Emery, Clanford L, MD   15 mL at 07/06/22 [id number]   Oral care mouth rinse  15 mL Mouth Rinse PRN Johnson, Clanford L, MD       oxyCODONE-acetaminophen (PERCOCET/ROXICET) 5-325 MG per tablet 1 tablet  1 tablet Oral Q4H PRN Adefeso, Oladapo, DO   1 tablet at 07/06/22 0850   polyethylene glycol (MIRALAX / GLYCOLAX) packet 17 g  17 g Oral Daily PRN Emokpae, Ejiroghene E, MD         Discharge Medications: Please see discharge summary for a list of discharge medications.  Relevant Imaging Results:  Relevant Lab Results:   Additional Information SSN [id number]  Leo Rod, LCSW

## 2022-07-06 NOTE — Progress Notes (Signed)
PROGRESS NOTE  Lisa Thornton [id number] DOB: [birth year] DOA: 07/03/2022 PCP: Lindell Spar, MD  Brief History:  67 y.o. female with medical history significant for   Atria fibrillation, hypertension, diabetes mellitus, breast cancer. Patient was brought to the ED reports of nausea vomiting over the past 3 days, and generalized weakness.  Went to the urgent care and was referred to the ED. At the time of my evaluation, patient is a bit confused and denies symptoms previously reported, answers a few questions.  Patient's grand niece-Lisa Thornton is at bedside and provides most of the history.  She reports that over the past 3 days, patient has had 1-2 episodes of vomiting daily.  No diarrhea.  No abdominal pain.  Blood sugars were in the 400s yesterday.  Patient denies chest pain or urinary symptoms.  It is unknown if she has been taking her medications over the past few days.  Patient's nurse reported today for in the ED, patient's speech has become a bit confused, like talking about her mother who died in 08-07-2015.   ED Course: Tmax 99.9.  Heart rate up to 150s.  Respiratory rate 18- 33.  Blood pressure systolic [id number] -A999333.  Potassium 3.1.  Magnesium 1.1.  Chest x-ray clear.  WBC 17.9.  Creatinine 1.4.  Cardizem drip started with improvement in heart rate.  1 L bolus given.  Magnesium and potassium supplementation started.  Hospitalist to admit for atrial fibrillation with RVR, possible encephalopathy.  Patient was initially started on vancomycin, cefepime, metronidazole.  Blood cultures and urine cultures grew E. coli.  Her antibiotics were de-escalated to ceftriaxone.  Chest x-ray showed right lower lobe opacity.  She was treated for pneumonia.  After fluid resuscitation, the patient became fluid overloaded.  She was started on IV Lasix.  She developed atrial fibrillation with RVR.  She was subsequently started on amiodarone drip.  As her sepsis physiology resolved, her atrial fibrillation  improved.   Assessment/Plan: Acute respiratory failure with hypoxia -Secondary to pulmonary edema and pneumonia -Currently on BiPAP -Wean oxygen as tolerated -now weaned to 5L   Lobar pneumonia -Personally reviewed chest x-ray--increased interstitial prominence, right lower lobe opacity, left retrocardiac opacity -PCT 69.47 -Continue ceftriaxone -   Severe sepsis -Secondary to pneumonia bacteremia -Present on admission -Meeting severe criteria with tachycardia heart rates up to 150s, ranging from 180-158, tachypnea respirate rate 18-33, leukocytosis of 17.9.  -Lactic acid peaked 2.0   Atrial fibrillation with RVR, type unspecified -d/c diltiazem drip -amiodarone drip started>>rate now controlled -Continue metoprolol -Continue apixaban  Acute HFmrEF -remains clinically fluid overloaded -continue lasix -4/3 echo EF 45-50%, mildly enlarged RV   AKI -Secondary to sepsis and hypotension -Baseline creatinine 0.8-1.0 -Presented with serum creatinine 1.40 -improving   E. coli bacteremia -Discontinue vancomycin and cefepime -Continue ceftriaxone   E. coli UTI -UA 11-20 WBC -Continue ceftriaxone   Acute metabolic Encephalopathy -due to infectious process -improving with treating infection   Uncontrolled diabetes mellitus type 2 with hyperglycemia -10/25/2022 hemoglobin A1c 7.8 -NovoLog sliding scale   Left breast cancer -Patient follows Dr. Delton Coombes -stage IIIb poorly differentiated left breast cancer, status post left modified radical mastectomy, chemotherapy. -Resume anastrozole   Hypertension -Currently on diltiazem drip -Continue metoprolol           Family Communication:  niece updated 4/4   Consultants:  none   Code Status:  FULL    DVT Prophylaxis: apixaban     Procedures: As  Listed in Progress Note Above   Antibiotics: Cefepime 4/1>>4/3 Vanc 4/1 Ceftriaxone 4/3>>               Subjective: Pt still has some dyspnea on  exertion.  Overall feeling better.  Denies f/c, cp, abd pain.  Had one episode n/v this am. Tolerated lunch  Objective: Vitals:   07/06/22 1200 07/06/22 1300 07/06/22 1400 07/06/22 1427  BP: (!) 117/49 107/61 (!) 103/48   Pulse: 83 81 82   Resp: 17 18 16    Temp:    99.6 F (37.6 C)  TempSrc:    Rectal  SpO2: 92% 100% 96%   Weight:      Height:        Intake/Output Summary (Last 24 hours) at 07/06/2022 1456 Last data filed at 07/06/2022 1426 Gross per 24 hour  Intake 875.71 ml  Output 1825 ml  Net -949.29 ml   Weight change:  Exam:  General:  Pt is alert, follows commands appropriately, not in acute distress HEENT: No icterus, No thrush, No neck mass, [place]/AT Cardiovascular: IRRR, S1/S2, no rubs, no gallops Respiratory: CTA bilaterally, no wheezing, no crackles, no rhonchi Abdomen: Soft/+BS, non tender, non distended, no guarding Extremities: 1+ LE edema, No lymphangitis, No petechiae, No rashes, no synovitis   Data Reviewed: I have personally reviewed following labs and imaging studies Basic Metabolic Panel: Recent Labs  Lab 07/03/22 1410 07/04/22 0320 07/05/22 0508 07/06/22 0546  NA 131* 133* 135 133*  K 3.1* 4.3 3.8 3.5  CL 93* 104 106 100  CO2 24 21* 19* 24  GLUCOSE 193* 205* 133* 113*  BUN 35* 30* 24* 20  CREATININE 1.40* 1.29* 1.03* 0.93  CALCIUM 8.9 7.8* 8.4* 8.7*  MG 1.1* 2.1  --  1.4*  PHOS  --   --   --  3.4   Liver Function Tests: Recent Labs  Lab 07/03/22 1410 07/05/22 0508 07/06/22 0546  AST 45* 32 26  ALT 22 16 16   ALKPHOS 51 59 62  BILITOT 0.9 1.3* 0.7  PROT 8.1 7.3 6.9  ALBUMIN 2.9* 2.4* 2.1*   No results for input(s): "LIPASE", "AMYLASE" in the last 168 hours. No results for input(s): "AMMONIA" in the last 168 hours. Coagulation Profile: No results for input(s): "INR", "PROTIME" in the last 168 hours. CBC: Recent Labs  Lab 07/03/22 1410 07/04/22 0320 07/05/22 0508 07/06/22 0546  WBC 17.9* 11.4* 9.8 11.1*  NEUTROABS 13.6*  --   7.6 7.8*  HGB 10.7* 9.2* 10.1* 9.9*  HCT 31.2* 27.5* 30.2* 28.7*  MCV 90.4 92.3 92.1 89.1  PLT 156 144* 148* 167   Cardiac Enzymes: No results for input(s): "CKTOTAL", "CKMB", "CKMBINDEX", "TROPONINI" in the last 168 hours. BNP: Invalid input(s): "POCBNP" CBG: Recent Labs  Lab 07/05/22 2026 07/05/22 2356 07/06/22 0417 07/06/22 0746 07/06/22 1124  GLUCAP 124* 154* 107* 117* 125*   HbA1C: No results for input(s): "HGBA1C" in the last 72 hours. Urine analysis:    Component Value Date/Time   COLORURINE YELLOW 07/03/2022 2057   APPEARANCEUR HAZY (A) 07/03/2022 2057   APPEARANCEUR Turbid (A) 04/26/2022 1413   LABSPEC 1.016 07/03/2022 2057   PHURINE 5.0 07/03/2022 2057   GLUCOSEU NEGATIVE 07/03/2022 2057   HGBUR MODERATE (A) 07/03/2022 2057   BILIRUBINUR NEGATIVE 07/03/2022 2057   BILIRUBINUR CANCELED 04/26/2022 1413   KETONESUR NEGATIVE 07/03/2022 2057   PROTEINUR 30 (A) 07/03/2022 2057   UROBILINOGEN 0.2 11/14/2021 1443   NITRITE POSITIVE (A) 07/03/2022 2057   LEUKOCYTESUR SMALL (A)  07/03/2022 2057   Sepsis Labs: @LABRCNTIP (procalcitonin:4,lacticidven:4) ) Recent Results (from the past 240 hour(s))  Resp panel by RT-PCR (RSV, Flu A&B, Covid) Anterior Nasal Swab     Status: None   Collection Time: 07/03/22  6:56 PM   Specimen: Anterior Nasal Swab  Result Value Ref Range Status   SARS Coronavirus 2 by RT PCR NEGATIVE NEGATIVE Final    Comment: (NOTE) SARS-CoV-2 target nucleic acids are NOT DETECTED.  The SARS-CoV-2 RNA is generally detectable in upper respiratory specimens during the acute phase of infection. The lowest concentration of SARS-CoV-2 viral copies this assay can detect is 138 copies/mL. A negative result does not preclude SARS-Cov-2 infection and should not be used as the sole basis for treatment or other patient management decisions. A negative result may occur with  improper specimen collection/handling, submission of specimen other than  nasopharyngeal swab, presence of viral mutation(s) within the areas targeted by this assay, and inadequate number of viral copies(<138 copies/mL). A negative result must be combined with clinical observations, patient history, and epidemiological information. The expected result is Negative.  Fact Sheet for Patients:  EntrepreneurPulse.com.au  Fact Sheet for Healthcare Providers:  IncredibleEmployment.be  This test is no t yet approved or cleared by the [place] FDA and  has been authorized for detection and/or diagnosis of SARS-CoV-2 by FDA under an Emergency Use Authorization (EUA). This EUA will remain  in effect (meaning this test can be used) for the duration of the COVID-19 declaration under Section 564(b)(1) of the Act, 21 U.S.C.section 360bbb-3(b)(1), unless the authorization is terminated  or revoked sooner.       Influenza A by PCR NEGATIVE NEGATIVE Final   Influenza B by PCR NEGATIVE NEGATIVE Final    Comment: (NOTE) The Xpert Xpress SARS-CoV-2/FLU/RSV plus assay is intended as an aid in the diagnosis of influenza from Nasopharyngeal swab specimens and should not be used as a sole basis for treatment. Nasal washings and aspirates are unacceptable for Xpert Xpress SARS-CoV-2/FLU/RSV testing.  Fact Sheet for Patients: EntrepreneurPulse.com.au  Fact Sheet for Healthcare Providers: IncredibleEmployment.be  This test is not yet approved or cleared by the [place] FDA and has been authorized for detection and/or diagnosis of SARS-CoV-2 by FDA under an Emergency Use Authorization (EUA). This EUA will remain in effect (meaning this test can be used) for the duration of the COVID-19 declaration under Section 564(b)(1) of the Act, 21 U.S.C. section 360bbb-3(b)(1), unless the authorization is terminated or revoked.     Resp Syncytial Virus by PCR NEGATIVE NEGATIVE Final    Comment:  (NOTE) Fact Sheet for Patients: EntrepreneurPulse.com.au  Fact Sheet for Healthcare Providers: IncredibleEmployment.be  This test is not yet approved or cleared by the [place] FDA and has been authorized for detection and/or diagnosis of SARS-CoV-2 by FDA under an Emergency Use Authorization (EUA). This EUA will remain in effect (meaning this test can be used) for the duration of the COVID-19 declaration under Section 564(b)(1) of the Act, 21 U.S.C. section 360bbb-3(b)(1), unless the authorization is terminated or revoked.  Performed at [facility], [address], [place], [place] [address]   Culture, blood (Routine X 2) w Reflex to ID Panel     Status: Abnormal   Collection Time: 07/03/22  9:11 PM   Specimen: BLOOD RIGHT HAND  Result Value Ref Range Status   Specimen Description   Final    BLOOD RIGHT HAND Performed at [facility], [place] [address], [place], [place] [address]  Special Requests   Final    BOTTLES DRAWN AEROBIC AND ANAEROBIC Blood Culture adequate volume Performed at [facility], [address], [place], [place] [address]    Culture  Setup Time   Final    GRAM NEGATIVE RODS Gram Stain Report Called to,Read Back By and Verified With: SHELTON, A. @ [phone] 07/04/2022 BY FRATTO, A. IN BOTH AEROBIC AND ANAEROBIC BOTTLES CRITICAL RESULT CALLED TO, READ BACK BY AND VERIFIED WITH: PHARMD Lu Duffel LF:[phone] BY JRS Performed at [facility], [place] [address], [place], [place] [address]    Culture ESCHERICHIA COLI (A)  Final   Report Status 07/06/2022 FINAL  Final   Organism ID, Bacteria ESCHERICHIA COLI  Final      Susceptibility   Escherichia coli - MIC*    AMPICILLIN 4 SENSITIVE Sensitive     CEFEPIME <=0.12 SENSITIVE Sensitive     CEFTAZIDIME <=1 SENSITIVE Sensitive     CEFTRIAXONE <=0.25 SENSITIVE Sensitive     CIPROFLOXACIN <=0.25 SENSITIVE Sensitive     GENTAMICIN <=1 SENSITIVE Sensitive      IMIPENEM <=0.25 SENSITIVE Sensitive     TRIMETH/SULFA <=20 SENSITIVE Sensitive     AMPICILLIN/SULBACTAM <=2 SENSITIVE Sensitive     PIP/TAZO <=4 SENSITIVE Sensitive     * ESCHERICHIA COLI  Blood Culture ID Panel (Reflexed)     Status: Abnormal   Collection Time: 07/03/22  9:11 PM  Result Value Ref Range Status   Enterococcus faecalis NOT DETECTED NOT DETECTED Final   Enterococcus Faecium NOT DETECTED NOT DETECTED Final   Listeria monocytogenes NOT DETECTED NOT DETECTED Final   Staphylococcus species NOT DETECTED NOT DETECTED Final   Staphylococcus aureus (BCID) NOT DETECTED NOT DETECTED Final   Staphylococcus epidermidis NOT DETECTED NOT DETECTED Final   Staphylococcus lugdunensis NOT DETECTED NOT DETECTED Final   Streptococcus species NOT DETECTED NOT DETECTED Final   Streptococcus agalactiae NOT DETECTED NOT DETECTED Final   Streptococcus pneumoniae NOT DETECTED NOT DETECTED Final   Streptococcus pyogenes NOT DETECTED NOT DETECTED Final   A.calcoaceticus-baumannii NOT DETECTED NOT DETECTED Final   Bacteroides fragilis NOT DETECTED NOT DETECTED Final   Enterobacterales DETECTED (A) NOT DETECTED Final    Comment: Enterobacterales represent a large order of gram negative bacteria, not a single organism. CRITICAL RESULT CALLED TO, READ BACK BY AND VERIFIED WITH: PHARMD DEVAN MITCHELL LF:[phone] BY JRS    Enterobacter cloacae complex NOT DETECTED NOT DETECTED Final   Escherichia coli DETECTED (A) NOT DETECTED Final    Comment: CRITICAL RESULT CALLED TO, READ BACK BY AND VERIFIED WITH: PHARMD DEVAN MITCHELL LF:[phone] BY JRS    Klebsiella aerogenes NOT DETECTED NOT DETECTED Final   Klebsiella oxytoca NOT DETECTED NOT DETECTED Final   Klebsiella pneumoniae NOT DETECTED NOT DETECTED Final   Proteus species NOT DETECTED NOT DETECTED Final   Salmonella species NOT DETECTED NOT DETECTED Final   Serratia marcescens NOT DETECTED NOT DETECTED Final   Haemophilus influenzae NOT DETECTED  NOT DETECTED Final   Neisseria meningitidis NOT DETECTED NOT DETECTED Final   Pseudomonas aeruginosa NOT DETECTED NOT DETECTED Final   Stenotrophomonas maltophilia NOT DETECTED NOT DETECTED Final   Candida albicans NOT DETECTED NOT DETECTED Final   Candida auris NOT DETECTED NOT DETECTED Final   Candida glabrata NOT DETECTED NOT DETECTED Final   Candida krusei NOT DETECTED NOT DETECTED Final   Candida parapsilosis NOT DETECTED NOT DETECTED Final   Candida tropicalis NOT DETECTED NOT DETECTED Final   Cryptococcus neoformans/gattii NOT  DETECTED NOT DETECTED Final   CTX-M ESBL NOT DETECTED NOT DETECTED Final   Carbapenem resistance IMP NOT DETECTED NOT DETECTED Final   Carbapenem resistance KPC NOT DETECTED NOT DETECTED Final   Carbapenem resistance NDM NOT DETECTED NOT DETECTED Final   Carbapenem resist OXA 48 LIKE NOT DETECTED NOT DETECTED Final   Carbapenem resistance VIM NOT DETECTED NOT DETECTED Final    Comment: Performed at [facility], [place] [address], [place], [place] [address]  Culture, blood (Routine X 2) w Reflex to ID Panel     Status: Abnormal   Collection Time: 07/03/22  9:13 PM   Specimen: BLOOD LEFT ARM  Result Value Ref Range Status   Specimen Description   Final    BLOOD LEFT ARM Performed at [facility], [place] [address] [place], [place] [address]    Special Requests   Final    BOTTLES DRAWN AEROBIC AND ANAEROBIC Blood Culture adequate volume Performed at [facility], [address] [place], [place] [address]    Culture  Setup Time   Final    GRAM NEGATIVE RODS Gram Stain Report Called to,Read Back By and Verified With: SHELTON, A. @[phone]  07/04/2022 BY FRATTO, A. IN BOTH AEROBIC AND ANAEROBIC BOTTLES CRITICAL VALUE NOTED.  VALUE IS CONSISTENT WITH PREVIOUSLY REPORTED AND CALLED VALUE.    Culture (A)  Final    ESCHERICHIA COLI SUSCEPTIBILITIES PERFORMED ON PREVIOUS CULTURE WITHIN THE LAST 5 DAYS. Performed at [facility], [place] [address]  [place], [place] [address]    Report Status 07/06/2022 FINAL  Final  MRSA Next Gen by PCR, Nasal     Status: None   Collection Time: 07/03/22  9:18 PM   Specimen: Nasal Mucosa; Nasal Swab  Result Value Ref Range Status   MRSA by PCR Next Gen NOT DETECTED NOT DETECTED Final    Comment: (NOTE) The GeneXpert MRSA Assay (FDA approved for NASAL specimens only), is one component of a comprehensive MRSA colonization surveillance program. It is not intended to diagnose MRSA infection nor to guide or monitor treatment for MRSA infections. Test performance is not FDA approved in patients less than 66 years old. Performed at [facility], [address] [place], [place] [address]   Urine Culture     Status: Abnormal   Collection Time: 07/03/22  9:55 PM   Specimen: Urine, Clean Catch  Result Value Ref Range Status   Specimen Description   Final    URINE, CLEAN CATCH Performed at [facility], [address] [place], [place] [address]    Special Requests   Final    NONE Performed at [facility], [address] [place], [place] [address]    Culture >=100,000 COLONIES/mL ESCHERICHIA COLI (A)  Final   Report Status 07/06/2022 FINAL  Final   Organism ID, Bacteria ESCHERICHIA COLI (A)  Final      Susceptibility   Escherichia coli - MIC*    AMPICILLIN 4 SENSITIVE Sensitive     CEFAZOLIN <=4 SENSITIVE Sensitive     CEFEPIME <=0.12 SENSITIVE Sensitive     CEFTRIAXONE <=0.25 SENSITIVE Sensitive     CIPROFLOXACIN <=0.25 SENSITIVE Sensitive     GENTAMICIN <=1 SENSITIVE Sensitive     IMIPENEM <=0.25 SENSITIVE Sensitive     NITROFURANTOIN <=16 SENSITIVE Sensitive     TRIMETH/SULFA <=20 SENSITIVE Sensitive     AMPICILLIN/SULBACTAM <=2 SENSITIVE Sensitive     PIP/TAZO <=4 SENSITIVE Sensitive     * >=100,000 COLONIES/mL ESCHERICHIA COLI  Scheduled Meds:  anastrozole  1 mg Oral Daily   apixaban  5 mg Oral BID   Chlorhexidine Gluconate Cloth  6 each Topical Daily   furosemide  40 mg Intravenous  Daily   insulin aspart  0-9 Units Subcutaneous Q4H   metoprolol tartrate  50 mg Oral BID   mouth rinse  15 mL Mouth Rinse 4 times per day   Continuous Infusions:  amiodarone 30 mg/hr (07/06/22 1353)   cefTRIAXone (ROCEPHIN)  IV 2 g (07/06/22 1013)    Procedures/Studies: DG Pelvis 1-2 Views  Result Date: 07/06/2022 CLINICAL DATA:  Pain EXAM: PELVIS - 1 VIEW COMPARISON:  None Available. FINDINGS: There is no evidence of pelvic fracture or diastasis. No pelvic bone lesions are seen. IMPRESSION: Negative. Electronically Signed   By: Sammie Bench M.D.   On: 07/06/2022 10:27   DG FEMUR PORT, MIN 2 VIEWS RIGHT  Result Date: 07/06/2022 CLINICAL DATA:  Fall EXAM: RIGHT FEMUR PORTABLE 2 VIEW COMPARISON:  None Available. FINDINGS: There is no evidence of fracture or other focal bone lesions. Soft tissues are unremarkable. IMPRESSION: Negative. Electronically Signed   By: Sammie Bench M.D.   On: 07/06/2022 10:17   [place] EKG SITE RITE  Result Date: 07/05/2022 If Site Rite image not attached, placement could not be confirmed due to current cardiac rhythm.  ECHOCARDIOGRAM COMPLETE  Result Date: 07/05/2022    ECHOCARDIOGRAM REPORT   Patient Name:   SHEALYNN BLANKEN Date of Exam: 07/05/2022 Medical Rec #:  [id number]    Height:       64.0 in Accession #:    [id number]   Weight:       147.9 lb Date of Birth:  Dec 24, 1955   BSA:          1.721 m Patient Age:    34 years     BP:           124/53 mmHg Patient Gender: F            HR:           117 bpm. Exam Location:  [facility] Procedure: 2D Echo, Cardiac Doppler and Color Doppler Indications:    CHF-Acute Diastolic [id number]  History:        Patient has prior history of Echocardiogram examinations, most                 recent 01/13/2021. Sepsis, Arrythmias:Atrial Fibrillation,                 Signs/Symptoms:Chest Pain; Risk Factors:Hypertension and                 Diabetes.  Sonographer:    Greer Pickerel Referring Phys: [phone] Aamir Mclinden  Sonographer Comments: Image  acquisition challenging due to patient body habitus and Image acquisition challenging due to respiratory motion. IMPRESSIONS  1. Challenging study for LVEF assessment due to tachycardia. Left ventricular ejection fraction, by estimation, is 45 to 50%. The left ventricle has mildly decreased function. Left ventricular endocardial border not optimally defined to evaluate regional wall motion. Left ventricular diastolic function could not be evaluated due to atrial fibrillation.  2. Right ventricular systolic function was not well visualized. The right ventricular size is mildly enlarged.  3. Left atrial size was moderately dilated.  4. The mitral valve is grossly normal. Trivial mitral valve regurgitation. No evidence of mitral stenosis.  5. The aortic valve is tricuspid. There is mild calcification of the aortic valve. Aortic valve regurgitation is not  visualized. No aortic stenosis is present. Comparison(s): Changes from prior study are noted. LVEF worsened from 55% in 2020 to 45-50% now. FINDINGS  Left Ventricle: Left ventricular ejection fraction, by estimation, is 45 to 50%. The left ventricle has mildly decreased function. Left ventricular endocardial border not optimally defined to evaluate regional wall motion. The left ventricular internal cavity size was normal in size. There is no left ventricular hypertrophy. Left ventricular diastolic function could not be evaluated due to atrial fibrillation. Left ventricular diastolic function could not be evaluated. Right Ventricle: The right ventricular size is mildly enlarged. No increase in right ventricular wall thickness. Right ventricular systolic function was not well visualized. Left Atrium: Left atrial size was moderately dilated. Right Atrium: Right atrial size was normal in size. Pericardium: There is no evidence of pericardial effusion. Mitral Valve: The mitral valve is grossly normal. Trivial mitral valve regurgitation. No evidence of mitral valve  stenosis. Tricuspid Valve: The tricuspid valve is grossly normal. Tricuspid valve regurgitation is mild . No evidence of tricuspid stenosis. Aortic Valve: The aortic valve is tricuspid. There is mild calcification of the aortic valve. Aortic valve regurgitation is not visualized. No aortic stenosis is present. Pulmonic Valve: The pulmonic valve was not well visualized. Pulmonic valve regurgitation is not visualized. No evidence of pulmonic stenosis. Aorta: The aortic root and ascending aorta are structurally normal, with no evidence of dilitation. Venous: The inferior vena cava was not well visualized. IAS/Shunts: The interatrial septum was not well visualized.  LEFT VENTRICLE PLAX 2D LVIDd:         4.20 cm   Diastology LVIDs:         3.20 cm   LV e' medial:    9.25 cm/s LV PW:         1.10 cm   LV E/e' medial:  14.6 LV IVS:        0.70 cm   LV e' lateral:   10.10 cm/s LVOT diam:     1.80 cm   LV E/e' lateral: 13.4 LV SV:         43 LV SV Index:   25 LVOT Area:     2.54 cm  RIGHT VENTRICLE RV S prime:     11.30 cm/s TAPSE (M-mode): 1.7 cm LEFT ATRIUM           Index        RIGHT ATRIUM           Index LA diam:      3.30 cm 1.92 cm/m   RA Area:     16.70 cm LA Vol (A2C): 25.5 ml 14.82 ml/m  RA Volume:   46.90 ml  27.25 ml/m LA Vol (A4C): 76.6 ml 44.51 ml/m  AORTIC VALVE LVOT Vmax:   82.50 cm/s LVOT Vmean:  53.300 cm/s LVOT VTI:    0.170 m  AORTA Ao Root diam: 2.80 cm Ao Asc diam:  3.10 cm MITRAL VALVE                TRICUSPID VALVE MV Area (PHT): 5.54 cm     TR Peak grad:   27.2 mmHg MV Decel Time: 137 msec     TR Vmax:        261.00 cm/s MR Peak grad: 53.4 mmHg MR Vmax:      365.50 cm/s   SHUNTS MV E velocity: 135.00 cm/s  Systemic VTI:  0.17 m  Systemic Diam: 1.80 cm Vishnu Priya Mallipeddi Electronically signed by Lorelee Cover Mallipeddi Signature Date/Time: 07/05/2022/3:45:55 PM    Final    DG CHEST PORT 1 VIEW  Result Date: 07/05/2022 CLINICAL DATA:  Shortness of breath EXAM:  PORTABLE CHEST 1 VIEW COMPARISON:  Yesterday FINDINGS: Cardiomegaly. Interstitial opacity with streaky density in the lower lungs. Small right pleural effusion. No pneumothorax. Porta catheter with tip at the SVC. IMPRESSION: Interstitial and airspace opacity at the bases that could be infection and/or edema. Aeration is worsened from yesterday. Electronically Signed   By: Jorje Guild M.D.   On: 07/05/2022 07:56   MM 3D SCREEN BREAST UNI RIGHT  Result Date: 07/04/2022 CLINICAL DATA:  Screening. History left mastectomy 2019. EXAM: DIGITAL SCREENING UNILATERAL RIGHT MAMMOGRAM WITH CAD AND TOMOSYNTHESIS TECHNIQUE: Right screening digital craniocaudal and mediolateral oblique mammograms were obtained. Right screening digital breast tomosynthesis was performed. The images were evaluated with computer-aided detection. COMPARISON:  Previous exam(s). ACR Breast Density Category b: There are scattered areas of fibroglandular density. FINDINGS: There are no findings suspicious for malignancy. Note that a request was initially made to repeat the right CC to obtain more posterior tissue however the patient is currently hospitalized and located in the intensive care unit and therefore would not be able to obtain any additional mammography any time soon. IMPRESSION: No mammographic evidence of malignancy. A result letter of this screening mammogram will be mailed directly to the patient. RECOMMENDATION: Screening mammogram in one year. (Code:[id number]) BI-RADS CATEGORY  1: Negative. Electronically Signed   By: Everlean Alstrom M.D.   On: 07/04/2022 15:57   DG Chest Port 1 View  Result Date: 07/04/2022 CLINICAL DATA:  67 year old female with history of dyspnea. EXAM: PORTABLE CHEST 1 VIEW COMPARISON:  Chest x-ray 07/03/2022. FINDINGS: Right subclavian single-lumen power porta cath with tip terminating in the distal superior vena cava. Areas of interstitial prominence and peribronchial cuffing, most evident in the mid to  lower lungs bilaterally. Focal opacity in the retrocardiac region on the left likely reflects atelectasis, although developing airspace consolidation is not excluded. No pleural effusions. No pneumothorax. No evidence of pulmonary edema. Heart size is normal. Upper mediastinal contours are within normal limits. IMPRESSION: 1. The appearance of the chest may suggest an acute bronchitis. 2. There is also a retrocardiac opacity in the medial left base which may reflect an area of atelectasis or developing airspace consolidation. Followup PA and lateral chest X-ray is recommended in 3-4 weeks following trial of antibiotic therapy to ensure resolution and exclude underlying malignancy. Electronically Signed   By: Vinnie Langton M.D.   On: 07/04/2022 05:45   CT Head Wo Contrast  Result Date: 07/03/2022 CLINICAL DATA:  Nausea and vomiting for 3 days, altered level of consciousness EXAM: CT HEAD WITHOUT CONTRAST TECHNIQUE: Contiguous axial images were obtained from the base of the skull through the vertex without intravenous contrast. RADIATION DOSE REDUCTION: This exam was performed according to the departmental dose-optimization program which includes automated exposure control, adjustment of the mA and/or kV according to patient size and/or use of iterative reconstruction technique. COMPARISON:  None Available. FINDINGS: Brain: Hypodensity in the left occipital periventricular and subcortical white matter consistent with age indeterminate ischemic change. No other signs of acute infarct or hemorrhage. Lateral ventricles and midline structures are unremarkable. There are no acute extra-axial fluid collections. No mass effect. Vascular: Atherosclerosis of the internal carotid arteries. No hyperdense vessel. Skull: Normal. Negative for fracture or focal lesion. Sinuses/Orbits: Prominent mucoperiosteal thickening of the  bilateral maxillary sinuses, with a small superimposed gas fluid level on the left. Minimal retained  secretions within the sphenoid sinus. Other: None. IMPRESSION: 1. Age indeterminate ischemic changes within the left occipital periventricular and subcortical white matter, favor chronic. 2. Otherwise no acute intracranial process. 3. Bilateral maxillary sinus disease, with evidence of superimposed acute left maxillary sinusitis. Electronically Signed   By: Randa Ngo M.D.   On: 07/03/2022 19:43   DG Chest 2 View  Result Date: 07/03/2022 CLINICAL DATA:  AFib EXAM: CHEST - 2 VIEW COMPARISON:  CXR 06/27/17 FINDINGS: Right-sided chest port in place with the tip in the upper SVC, unchanged from prior exam. No pleural effusion. No pneumothorax. No focal airspace opacity. Normal cardiac and mediastinal contours. No radiographically apparent displaced rib fractures. Visualized upper abdomen is unremarkable. Vertebral body heights are maintained. IMPRESSION: No focal airspace opacity. Electronically Signed   By: Marin Roberts M.D.   On: 07/03/2022 14:44   DG Bone Density  Result Date: 06/26/2022 EXAM: DUAL X-RAY ABSORPTIOMETRY (DXA) FOR BONE MINERAL DENSITY IMPRESSION: Your patient Deem Donlon completed a BMD test on 06/26/2022 using the [place] (software version: 14.10) manufactured by [employer]. The following summarizes the results of our evaluation. Technologist: [facility] PATIENT BIOGRAPHICAL: Name: Samsara, Tedesco Patient ID: [id number] Birth Date: Dec 05, 1955 Height: 64.0 in. Gender: Female Exam Date: 06/26/2022 Weight: 151.0 lbs. Indications: Hx Breast Ca, Caucasian, Secondary Osteoporosis, Follow up Osteopenia, Post Menopausal Fractures: Treatments: Calcium, Anastrozole, Vitamin D DENSITOMETRY RESULTS: Site         Region     Measured Date Measured Age WHO Classification Young Adult T-score BMD         %Change vs. Previous Significant Change (*) DualFemur Neck Left 06/26/2020 68.3 Osteopenia -1.9 0.773 g/cm2 -5.3% Yes DualFemur Neck Left 06/18/2020 66.2 Osteopenia -1.6 0.816 g/cm2 2.6%  - DualFemur Neck Left 03/07/2018 64.0 Osteopenia -1.8 0.795 g/cm2 - - DualFemur Total Mean 06/26/2022 68.3 Osteopenia -1.3 0.838 g/cm2 0.0% - DualFemur Total Mean 06/18/2020 66.2 Osteopenia -1.3 0.838 g/cm2 -2.9% Yes DualFemur Total Mean 03/07/2018 64.0 - - 0.863 g/cm2 - - Left Forearm Radius 33% 06/26/2022 68.3 Osteopenia -1.4 0.616 g/cm2 -7.8% Yes Left Forearm Radius 33% 06/18/2020 66.2 Normal -0.6 0.668 g/cm2 -9.6% Yes Left Forearm Radius 33% 03/07/2018 64.0 Normal 0.4 0.739 g/cm2 - - ASSESSMENT: The BMD measured at Femur Neck Left is 0.773 g/cm2 with a T-score of -1.9. This patient is considered osteopenic according to [facility]) criteria. The scan quality is good. Compared with the prior study on 06/18/20, the BMD of the total mean shows no statistically significant change, however, the lt. forearm shows a significant decrease. Lumbar spine was excluded due to advanced degenerative changes. [facility] Pharmac[facility]) criteria for post-menopausal, Caucasian Women: Normal:       T-score at or above -1 SD Osteopenia:   T-score between -1 and -2.5 SD Osteoporosis: T-score at or below -2.5 SD RECOMMENDATIONS: 1. All patients should optimize calcium and vitamin D intake. 2. Consider FDA-approved medical therapies in postmenopausal women and med aged 54 years and older, based on the following: a. A hip or vertebral (clinical or morphometric) fracture b. T-score< -2.5 at the femoral neck or spine after appropriate evaluation to exclude secondary causes c. Low bone mass (T-score between -1.0 and -2.5 at the femoral neck or spine) and a 10-year probability of a hip fracture > 3% or a 10-year probability of a major osteoporosis-related fracture > 20% based on the  [place]-adapted WHO algorithm d. Clinician judgment and/or patient preferences may indicate treatment for people with 10-year fracture probabilities above or below these levels FOLLOW-UP: Patients with diagnosis of osteoporsis or at high risk  for fracture should have regular bone mineral density tests. For patients eligible for Medicare, routine testing is allowed once every 2 years. The testing frequency can be increased to one year for patients who have rapidly progressing disease, those who are receiving or discontinuing medical therapy to restore bone mass, or have additional risk factors. I have reviewed this report, and agree with the above findings. [facility] Your patient SOMALIA SPIZZIRRI completed a FRAX assessment on 06/26/2022 using the [place] (analysis version: 14.10) manufactured by [employer]. The following summarizes the results of our evaluation. PATIENT BIOGRAPHICAL: Name: Jehieli, Bernabei Patient ID: [id number] Birth Date: 10-Jan-1956 Height:    64.0 in. Gender:     [age]    [age]       Weight:    151.0 lbs. Ethnicity:  White                            Exam Date: 06/26/2022 FRAX* RESULTS:  (version: 3.5) 10-year Probability of Fracture1 Major Osteoporotic Fracture2 Hip Fracture 11.3% 1.9% Population: [place] (Caucasian) Risk Factors: Secondary Osteoporosis Based on DualFemur (Left) Neck BMD 1 -The 10-year probability of fracture may be lower than reported if the patient has received treatment. 2 -Major Osteoporotic Fracture: Clinical Spine, Forearm, Hip or Shoulder *FRAX is a Materials engineer of the [employer] for Metabolic Bone Disease, a [place] (WHO) [facility]. ASSESSMENT: The probability of a major osteoporotic fracture is 11.3% within the next ten years. The probability of a hip fracture is 1.9% within the next ten years. Electronically Signed   By: Dorise Bullion III M.D.   On: 06/26/2022 09:39    Orson Eva, DO  [facility]  If 7PM-7AM, please contact night-coverage [email or web address] Password [email or web address] 07/06/2022, 2:56 PM   LOS: 3 days

## 2022-07-06 NOTE — Evaluation (Signed)
Physical Therapy Evaluation Patient Details Name: Lisa Thornton MRN: [id number] DOB: 02/21/1956 Today's Date: 07/06/2022  History of Present Illness  Lisa Thornton is a 67 y.o. female with medical history significant for   Atria fibrillation, hypertension, diabetes mellitus, breast cancer.  Patient was brought to the ED reports of nausea vomiting over the past 3 days, and generalized weakness.  Went to the urgent care and was referred to the ED.  At the time of my evaluation, patient is a bit confused and denies symptoms previously reported, answers a few questions.  Patient's grand niece-Lisa Thornton is at bedside and provides most of the history.  She reports that over the past 3 days, patient has had 1-2 episodes of vomiting daily.  No diarrhea.  No abdominal pain.  Blood sugars were in the 400s yesterday.  Patient denies chest pain or urinary symptoms.  It is unknown if she has been taking her medications over the past few days.  Patient's nurse reported today for in the ED, patient's speech has become a bit confused, like talking about her mother who died in 2015/08/08.   Clinical Impression  Patient demonstrates slow labored movement for sitting up at bedside, had to roll to side and sit up from side lying position due to discomfort in stomach, unable to stand without AD due to BLE weakness and required use of RW for safety.  Patient limited to a few side steps before having to sit due to fatigue and tolerated staying up in chair while on 5  LPM with SpO2 at 96%.  Patient will benefit from continued skilled physical therapy in hospital and recommended venue below to increase strength, balance, endurance for safe ADLs and gait.         Recommendations for follow up therapy are one component of a multi-disciplinary discharge planning process, led by the attending physician.  Recommendations may be updated based on patient status, additional functional criteria and insurance authorization.  Follow Up  Recommendations Can patient physically be transported by private vehicle: Yes     Assistance Recommended at Discharge Set up Supervision/Assistance  Patient can return home with the following  A lot of help with bathing/dressing/bathroom;A lot of help with walking and/or transfers;Help with stairs or ramp for entrance;Assistance with cooking/housework    Equipment Recommendations Rolling walker (2 wheels)  Recommendations for Other Services       Functional Status Assessment Patient has had a recent decline in their functional status and demonstrates the ability to make significant improvements in function in a reasonable and predictable amount of time.     Precautions / Restrictions Precautions Precautions: Fall Restrictions Weight Bearing Restrictions: No      Mobility  Bed Mobility Overal bed mobility: Needs Assistance Bed Mobility: Supine to Sit     Supine to sit: Mod assist     General bed mobility comments: increased time, labored movement    Transfers Overall transfer level: Needs assistance Equipment used: Rolling walker (2 wheels) Transfers: Sit to/from Stand, Bed to chair/wheelchair/BSC Sit to Stand: Min assist, Mod assist   Step pivot transfers: Min assist, Mod assist       General transfer comment: slow labored movement, unable stand without AD due to BLE weakness    Ambulation/Gait Ambulation/Gait assistance: Mod assist Gait Distance (Feet): 3 Feet Assistive device: Rolling walker (2 wheels) Gait Pattern/deviations: Decreased step length - right, Decreased step length - left, Decreased stride length Gait velocity: decreased     General Gait Details: limited to  a few side steps before having to sit due to weakness and SpO2 desaturation from 93% to 70% on 5 LPM O2  Stairs            Wheelchair Mobility    Modified Rankin (Stroke Patients Only)       Balance Overall balance assessment: Needs assistance Sitting-balance support: Feet  supported, No upper extremity supported Sitting balance-Leahy Scale: Fair Sitting balance - Comments: fair/good seated at EOB   Standing balance support: During functional activity, No upper extremity supported Standing balance-Leahy Scale: Poor Standing balance comment: fair/poor using RW                             Pertinent Vitals/Pain Pain Assessment Pain Assessment: Faces Faces Pain Scale: Hurts a little bit Pain Location: stomach Pain Descriptors / Indicators: Grimacing, Discomfort Pain Intervention(s): Limited activity within patient's tolerance, Monitored during session, Repositioned    Home Living Family/patient expects to be discharged to:: Private residence Living Arrangements: Other relatives Available Help at Discharge: Family;Available 24 hours/day Type of Home: Mobile home Home Access: Ramped entrance       Home Layout: One level Home Equipment: None      Prior Function Prior Level of Function : Independent/Modified Independent;Driving             Mobility Comments: Hydrographic surveyor without AD ADLs Comments: Independent     Hand Dominance        Extremity/Trunk Assessment   Upper Extremity Assessment Upper Extremity Assessment: Generalized weakness    Lower Extremity Assessment Lower Extremity Assessment: Generalized weakness    Cervical / Trunk Assessment Cervical / Trunk Assessment: Normal  Communication   Communication: No difficulties  Cognition Arousal/Alertness: Awake/alert Behavior During Therapy: WFL for tasks assessed/performed Overall Cognitive Status: Within Functional Limits for tasks assessed                                          General Comments      Exercises     Assessment/Plan    PT Assessment Patient needs continued PT services  PT Problem List Decreased strength;Decreased activity tolerance;Decreased balance;Decreased mobility       PT Treatment Interventions DME  instruction;Gait training;Stair training;Functional mobility training;Therapeutic activities;Therapeutic exercise;Patient/family education;Balance training    PT Goals (Current goals can be found in the Care Plan section)  Acute Rehab PT Goals Patient Stated Goal: return home with family to assist PT Goal Formulation: With patient Time For Goal Achievement: 07/20/22 Potential to Achieve Goals: Good    Frequency Min 3X/week     Co-evaluation               AM-PAC PT "6 Clicks" Mobility  Outcome Measure Help needed turning from your back to your side while in a flat bed without using bedrails?: A Lot Help needed moving from lying on your back to sitting on the side of a flat bed without using bedrails?: A Lot Help needed moving to and from a bed to a chair (including a wheelchair)?: A Lot Help needed standing up from a chair using your arms (e.g., wheelchair or bedside chair)?: A Lot Help needed to walk in hospital room?: A Lot Help needed climbing 3-5 steps with a railing? : Total 6 Click Score: 11    End of Session Equipment Utilized During Treatment: Oxygen Activity Tolerance: Patient  tolerated treatment well;Patient limited by fatigue Patient left: in chair;with call bell/phone within reach Nurse Communication: Mobility status PT Visit Diagnosis: Unsteadiness on feet (R26.81);Other abnormalities of gait and mobility (R26.89);Muscle weakness (generalized) (M62.81)    Time: [id number] PT Time Calculation (min) (ACUTE ONLY): 30 min   Charges:   PT Evaluation $PT Eval Moderate Complexity: 1 Mod PT Treatments $Therapeutic Activity: 23-37 mins        3:24 PM, 07/06/22 Lonell Grandchild, MPT Physical Therapist with [facility] 336 [phone] office [phone] mobile phone

## 2022-07-07 DIAGNOSIS — N179 Acute kidney failure, unspecified: Secondary | ICD-10-CM | POA: Diagnosis not present

## 2022-07-07 DIAGNOSIS — E876 Hypokalemia: Secondary | ICD-10-CM

## 2022-07-07 DIAGNOSIS — A403 Sepsis due to Streptococcus pneumoniae: Secondary | ICD-10-CM

## 2022-07-07 DIAGNOSIS — I5021 Acute systolic (congestive) heart failure: Secondary | ICD-10-CM

## 2022-07-07 DIAGNOSIS — I4891 Unspecified atrial fibrillation: Secondary | ICD-10-CM | POA: Diagnosis not present

## 2022-07-07 DIAGNOSIS — B9620 Unspecified Escherichia coli [E. coli] as the cause of diseases classified elsewhere: Secondary | ICD-10-CM

## 2022-07-07 DIAGNOSIS — J181 Lobar pneumonia, unspecified organism: Secondary | ICD-10-CM | POA: Diagnosis not present

## 2022-07-07 LAB — BASIC METABOLIC PANEL
Anion gap: 11 (ref 5–15)
BUN: 23 mg/dL (ref 8–23)
CO2: 23 mmol/L (ref 22–32)
Calcium: 8.3 mg/dL — ABNORMAL LOW (ref 8.9–10.3)
Chloride: 98 mmol/L (ref 98–111)
Creatinine, Ser: 0.95 mg/dL (ref 0.44–1.00)
GFR, Estimated: >60 mL/min (ref >60)
Glucose, Bld: 113 mg/dL — ABNORMAL HIGH (ref 70–99)
Potassium: 3.1 mmol/L — ABNORMAL LOW (ref 3.5–5.1)
Sodium: 132 mmol/L — ABNORMAL LOW (ref 135–145)

## 2022-07-07 LAB — GLUCOSE, CAPILLARY
Glucose-Capillary: 103 mg/dL — ABNORMAL HIGH (ref 70–99)
Glucose-Capillary: 107 mg/dL — ABNORMAL HIGH (ref 70–99)
Glucose-Capillary: 110 mg/dL — ABNORMAL HIGH (ref 70–99)
Glucose-Capillary: 126 mg/dL — ABNORMAL HIGH (ref 70–99)
Glucose-Capillary: 161 mg/dL — ABNORMAL HIGH (ref 70–99)
Glucose-Capillary: 171 mg/dL — ABNORMAL HIGH (ref 70–99)

## 2022-07-07 LAB — MAGNESIUM: Magnesium: 1.8 mg/dL (ref 1.7–2.4)

## 2022-07-07 MED ORDER — POTASSIUM CHLORIDE CRYS ER 20 MEQ PO TBCR
40.0000 meq | EXTENDED_RELEASE_TABLET | Freq: Once | ORAL | Status: AC
  Administered 2022-07-07: 40 meq via ORAL
  Filled 2022-07-07: qty 2

## 2022-07-07 MED ORDER — AMIODARONE HCL 200 MG PO TABS
200.0000 mg | ORAL_TABLET | Freq: Two times a day (BID) | ORAL | Status: DC
  Administered 2022-07-07 – 2022-07-10 (×7): 200 mg via ORAL
  Filled 2022-07-07 (×7): qty 1

## 2022-07-07 MED ORDER — INSULIN ASPART 100 UNIT/ML IJ SOLN: 0.0000 [IU] | Freq: Every day | INTRAMUSCULAR | Status: DC

## 2022-07-07 MED ORDER — FUROSEMIDE 10 MG/ML IJ SOLN
40.0000 mg | Freq: Two times a day (BID) | INTRAMUSCULAR | Status: DC
  Administered 2022-07-07 – 2022-07-09 (×4): 40 mg via INTRAVENOUS
  Filled 2022-07-07 (×4): qty 4

## 2022-07-07 MED ORDER — MAGNESIUM SULFATE 2 GM/50ML IV SOLN
2.0000 g | Freq: Once | INTRAVENOUS | Status: AC
  Administered 2022-07-07: 2 g via INTRAVENOUS
  Filled 2022-07-07: qty 50

## 2022-07-07 MED ORDER — INSULIN ASPART 100 UNIT/ML IJ SOLN
0.0000 [IU] | Freq: Three times a day (TID) | INTRAMUSCULAR | Status: DC
  Administered 2022-07-07 – 2022-07-08 (×2): 1 [IU] via SUBCUTANEOUS
  Administered 2022-07-08 – 2022-07-10 (×3): 2 [IU] via SUBCUTANEOUS
  Administered 2022-07-10 (×2): 1 [IU] via SUBCUTANEOUS

## 2022-07-07 NOTE — Progress Notes (Signed)
PROGRESS NOTE  Lisa Thornton:[id number] DOB: 05/05/1955 DOA: 07/03/2022 PCP: Anabel Halon, MD  Brief History:  67 y.o. female with medical history significant for   Atria fibrillation, hypertension, diabetes mellitus, breast cancer. Patient was brought to the ED reports of nausea vomiting over the past 3 days, and generalized weakness.  Went to the urgent care and was referred to the ED. At the time of my evaluation, patient is a bit confused and denies symptoms previously reported, answers a few questions.  Patient's grand niece-[name] is at bedside and provides most of the history.  She reports that over the past 3 days, patient has had 1-2 episodes of vomiting daily.  No diarrhea.  No abdominal pain.  Blood sugars were in the 400s yesterday.  Patient denies chest pain or urinary symptoms.  It is unknown if she has been taking her medications over the past few days.  Patient's nurse reported today for in the ED, patient's speech has become a bit confused, like talking about her mother who died in Jul 29, 2015.   ED Course: Tmax 99.9.  Heart rate up to 150s.  Respiratory rate 18- 33.  Blood pressure systolic 102 -135.  Potassium 3.1.  Magnesium 1.1.  Chest x-ray clear.  WBC 17.9.  Creatinine 1.4.  Cardizem drip started with improvement in heart rate.  1 L bolus given.  Magnesium and potassium supplementation started.  Hospitalist to admit for atrial fibrillation with RVR, possible encephalopathy.  Patient was initially started on vancomycin, cefepime, metronidazole.  Blood cultures and urine cultures grew E. coli.  Her antibiotics were de-escalated to ceftriaxone.  Chest x-ray showed right lower lobe opacity.  She was treated for pneumonia.  After fluid resuscitation, the patient became fluid overloaded.  She was started on IV Lasix.  She developed atrial fibrillation with RVR.  She was initially started on diltiazem drip.  This was transitioned to amiodarone secondary to soft blood pressure.    As her sepsis physiology resolved, her atrial fibrillation improved.   Assessment/Plan: Acute respiratory failure with hypoxia -Secondary to pulmonary edema and pneumonia -Currently on BiPAP -Wean oxygen as tolerated -now weaned to 5L   Lobar pneumonia -Personally reviewed chest x-ray--increased interstitial prominence, right lower lobe opacity, left retrocardiac opacity -PCT 69.47 -Continue ceftriaxone   Severe sepsis -Secondary to pneumonia bacteremia -Present on admission -Meeting severe criteria with tachycardia heart rates up to 150s, ranging from 180-158, tachypnea respirate rate 18-33, leukocytosis of 17.9.  -Lactic acid peaked 2.0   Atrial fibrillation with RVR, type unspecified -d/c diltiazem drip -amiodarone drip started>>rate now controlled -07/06/21--transitioned to po amio -Continue metoprolol -Continue apixaban   Acute HFmrEF -remains clinically fluid overloaded -continue lasix IV>>increase to bid -4/3 echo EF 45-50%, mildly enlarged RV   AKI -Secondary to sepsis and hypotension -Baseline creatinine 0.8-1.0 -Presented with serum creatinine 1.40 -improving   E. coli bacteremia -Discontinue vancomycin and cefepime -Continue ceftriaxone   E. coli UTI -UA 11-20 WBC -Continue ceftriaxone   Acute metabolic Encephalopathy -due to infectious process -improving with treating infection   Uncontrolled diabetes mellitus type 2 with hyperglycemia -04/26/2022 hemoglobin A1c 7.8 -NovoLog sliding scale   Left breast cancer -Patient follows Dr. Ellin Saba -stage IIIb poorly differentiated left breast cancer, status post left modified radical mastectomy, chemotherapy. -Resume anastrozole   Hypertension -initially on diltiazem drip>>amio due to soft BPs -Continue metoprolol           Family Communication:  niece updated 4/4  Consultants:  none   Code Status:  FULL    DVT Prophylaxis: apixaban     Procedures: As Listed in Progress Note Above    Antibiotics: Cefepime 4/1>>4/3 Vanc 4/1 Ceftriaxone 4/3>>        Subjective: Patient denies fevers, chills, headache, chest pain, dyspnea, nausea, vomiting, diarrhea, abdominal pain, dysuria, hematuria, hematochezia, and melena.   Objective: Vitals:   07/07/22 0922 07/07/22 1000 07/07/22 1100 07/07/22 1216  BP: (!) 124/40 (!) 115/37 (!) 111/40   Pulse: 100 85 89   Resp: (!) 21 (!) 21 17   Temp:    98.6 F (37 C)  TempSrc:    Axillary  SpO2: 93% 95% 92%   Weight:      Height:        Intake/Output Summary (Last 24 hours) at 07/07/2022 1313 Last data filed at 07/07/2022 1236 Gross per 24 hour  Intake 847.21 ml  Output 2680 ml  Net -1832.79 ml   Weight change:  Exam:  General:  Pt is alert, follows commands appropriately, not in acute distress HEENT: No icterus, No thrush, No neck mass, [place]/AT Cardiovascular: IRRR, S1/S2, no rubs, no gallops Respiratory: bibasilar rales.  No wheeze Abdomen: Soft/+BS, non tender, non distended, no guarding Extremities: 1 + LE edema, No lymphangitis, No petechiae, No rashes, no synovitis   Data Reviewed: I have personally reviewed following labs and imaging studies Basic Metabolic Panel: Recent Labs  Lab 07/03/22 1410 07/04/22 0320 07/05/22 0508 07/06/22 0546 07/07/22 0454  NA 131* 133* 135 133* 132*  K 3.1* 4.3 3.8 3.5 3.1*  CL 93* 104 106 100 98  CO2 24 21* 19* 24 23  GLUCOSE 193* 205* 133* 113* 113*  BUN 35* 30* 24* 20 23  CREATININE 1.40* 1.29* 1.03* 0.93 0.95  CALCIUM 8.9 7.8* 8.4* 8.7* 8.3*  MG 1.1* 2.1  --  1.4* 1.8  PHOS  --   --   --  3.4  --    Liver Function Tests: Recent Labs  Lab 07/03/22 1410 07/05/22 0508 07/06/22 0546  AST 45* 32 26  ALT 22 16 16   ALKPHOS 51 59 62  BILITOT 0.9 1.3* 0.7  PROT 8.1 7.3 6.9  ALBUMIN 2.9* 2.4* 2.1*   No results for input(s): "LIPASE", "AMYLASE" in the last 168 hours. No results for input(s): "AMMONIA" in the last 168 hours. Coagulation Profile: No results for  input(s): "INR", "PROTIME" in the last 168 hours. CBC: Recent Labs  Lab 07/03/22 1410 07/04/22 0320 07/05/22 0508 07/06/22 0546  WBC 17.9* 11.4* 9.8 11.1*  NEUTROABS 13.6*  --  7.6 7.8*  HGB 10.7* 9.2* 10.1* 9.9*  HCT 31.2* 27.5* 30.2* 28.7*  MCV 90.4 92.3 92.1 89.1  PLT 156 144* 148* 167   Cardiac Enzymes: No results for input(s): "CKTOTAL", "CKMB", "CKMBINDEX", "TROPONINI" in the last 168 hours. BNP: Invalid input(s): "POCBNP" CBG: Recent Labs  Lab 07/06/22 2004 07/07/22 0013 07/07/22 0517 07/07/22 0754 07/07/22 1148  GLUCAP 147* 107* 110* 103* 161*   HbA1C: No results for input(s): "HGBA1C" in the last 72 hours. Urine analysis:    Component Value Date/Time   COLORURINE YELLOW 07/03/2022 2057   APPEARANCEUR HAZY (A) 07/03/2022 2057   APPEARANCEUR Turbid (A) 04/26/2022 1413   LABSPEC 1.016 07/03/2022 2057   PHURINE 5.0 07/03/2022 2057   GLUCOSEU NEGATIVE 07/03/2022 2057   HGBUR MODERATE (A) 07/03/2022 2057   BILIRUBINUR NEGATIVE 07/03/2022 2057   BILIRUBINUR CANCELED 04/26/2022 1413   KETONESUR NEGATIVE 07/03/2022 2057   PROTEINUR  30 (A) 07/03/2022 2057   UROBILINOGEN 0.2 11/14/2021 1443   NITRITE POSITIVE (A) 07/03/2022 2057   LEUKOCYTESUR SMALL (A) 07/03/2022 2057   Sepsis Labs: @LABRCNTIP (procalcitonin:4,lacticidven:4) ) Recent Results (from the past 240 hour(s))  Resp panel by RT-PCR (RSV, Flu A&B, Covid) Anterior Nasal Swab     Status: None   Collection Time: 07/03/22  6:56 PM   Specimen: Anterior Nasal Swab  Result Value Ref Range Status   SARS Coronavirus 2 by RT PCR NEGATIVE NEGATIVE Final    Comment: (NOTE) SARS-CoV-2 target nucleic acids are NOT DETECTED.  The SARS-CoV-2 RNA is generally detectable in upper respiratory specimens during the acute phase of infection. The lowest concentration of SARS-CoV-2 viral copies this assay can detect is 138 copies/mL. A negative result does not preclude SARS-Cov-2 infection and should not be used as the  sole basis for treatment or other patient management decisions. A negative result may occur with  improper specimen collection/handling, submission of specimen other than nasopharyngeal swab, presence of viral mutation(s) within the areas targeted by this assay, and inadequate number of viral copies(<138 copies/mL). A negative result must be combined with clinical observations, patient history, and epidemiological information. The expected result is Negative.  Fact Sheet for Patients:  BloggerCourse.comhttps://www.fda.gov/media/152166/download  Fact Sheet for Healthcare Providers:  SeriousBroker.ithttps://www.fda.gov/media/152162/download  This test is no t yet approved or cleared by the [place] FDA and  has been authorized for detection and/or diagnosis of SARS-CoV-2 by FDA under an Emergency Use Authorization (EUA). This EUA will remain  in effect (meaning this test can be used) for the duration of the COVID-19 declaration under Section 564(b)(1) of the Act, 21 U.S.C.section 360bbb-3(b)(1), unless the authorization is terminated  or revoked sooner.       Influenza A by PCR NEGATIVE NEGATIVE Final   Influenza B by PCR NEGATIVE NEGATIVE Final    Comment: (NOTE) The Xpert Xpress SARS-CoV-2/FLU/RSV plus assay is intended as an aid in the diagnosis of influenza from Nasopharyngeal swab specimens and should not be used as a sole basis for treatment. Nasal washings and aspirates are unacceptable for Xpert Xpress SARS-CoV-2/FLU/RSV testing.  Fact Sheet for Patients: BloggerCourse.comhttps://www.fda.gov/media/152166/download  Fact Sheet for Healthcare Providers: SeriousBroker.ithttps://www.fda.gov/media/152162/download  This test is not yet approved or cleared by the [place] FDA and has been authorized for detection and/or diagnosis of SARS-CoV-2 by FDA under an Emergency Use Authorization (EUA). This EUA will remain in effect (meaning this test can be used) for the duration of the COVID-19 declaration under Section 564(b)(1) of the  Act, 21 U.S.C. section 360bbb-3(b)(1), unless the authorization is terminated or revoked.     Resp Syncytial Virus by PCR NEGATIVE NEGATIVE Final    Comment: (NOTE) Fact Sheet for Patients: BloggerCourse.comhttps://www.fda.gov/media/152166/download  Fact Sheet for Healthcare Providers: SeriousBroker.ithttps://www.fda.gov/media/152162/download  This test is not yet approved or cleared by the [place] FDA and has been authorized for detection and/or diagnosis of SARS-CoV-2 by FDA under an Emergency Use Authorization (EUA). This EUA will remain in effect (meaning this test can be used) for the duration of the COVID-19 declaration under Section 564(b)(1) of the Act, 21 U.S.C. section 360bbb-3(b)(1), unless the authorization is terminated or revoked.  Performed at [facility], [address] [place], [place] [id number]   Culture, blood (Routine X 2) w Reflex to ID Panel     Status: Abnormal   Collection Time: 07/03/22  9:11 PM   Specimen: BLOOD RIGHT HAND  Result Value Ref Range Status   Specimen Description   Final  BLOOD RIGHT HAND Performed at [facility], [address], [place], [place] [address]    Special Requests   Final    BOTTLES DRAWN AEROBIC AND ANAEROBIC Blood Culture adequate volume Performed at [facility], [address], [place], [place] [address]    Culture  Setup Time   Final    GRAM NEGATIVE RODS Gram Stain Report Called to,Read Back By and Verified With: SHELTON, A. @ [phone] 07/04/2022 BY FRATTO, A. IN BOTH AEROBIC AND ANAEROBIC BOTTLES CRITICAL RESULT CALLED TO, READ BACK BY AND VERIFIED WITH: PHARMD Drusilla Kanner [phone] [id number] BY JRS Performed at [facility], [address], [place], [place] [address]    Culture ESCHERICHIA COLI (A)  Final   Report Status 07/06/2022 FINAL  Final   Organism ID, Bacteria ESCHERICHIA COLI  Final      Susceptibility   Escherichia coli - MIC*    AMPICILLIN 4 SENSITIVE Sensitive     CEFEPIME <=0.12 SENSITIVE Sensitive     CEFTAZIDIME  <=1 SENSITIVE Sensitive     CEFTRIAXONE <=0.25 SENSITIVE Sensitive     CIPROFLOXACIN <=0.25 SENSITIVE Sensitive     GENTAMICIN <=1 SENSITIVE Sensitive     IMIPENEM <=0.25 SENSITIVE Sensitive     TRIMETH/SULFA <=20 SENSITIVE Sensitive     AMPICILLIN/SULBACTAM <=2 SENSITIVE Sensitive     PIP/TAZO <=4 SENSITIVE Sensitive     * ESCHERICHIA COLI  Blood Culture ID Panel (Reflexed)     Status: Abnormal   Collection Time: 07/03/22  9:11 PM  Result Value Ref Range Status   Enterococcus faecalis NOT DETECTED NOT DETECTED Final   Enterococcus Faecium NOT DETECTED NOT DETECTED Final   Listeria monocytogenes NOT DETECTED NOT DETECTED Final   Staphylococcus species NOT DETECTED NOT DETECTED Final   Staphylococcus aureus (BCID) NOT DETECTED NOT DETECTED Final   Staphylococcus epidermidis NOT DETECTED NOT DETECTED Final   Staphylococcus lugdunensis NOT DETECTED NOT DETECTED Final   Streptococcus species NOT DETECTED NOT DETECTED Final   Streptococcus agalactiae NOT DETECTED NOT DETECTED Final   Streptococcus pneumoniae NOT DETECTED NOT DETECTED Final   Streptococcus pyogenes NOT DETECTED NOT DETECTED Final   A.calcoaceticus-baumannii NOT DETECTED NOT DETECTED Final   Bacteroides fragilis NOT DETECTED NOT DETECTED Final   Enterobacterales DETECTED (A) NOT DETECTED Final    Comment: Enterobacterales represent a large order of gram negative bacteria, not a single organism. CRITICAL RESULT CALLED TO, READ BACK BY AND VERIFIED WITH: PHARMD DEVAN MITCHELL [phone] 1542 BY JRS    Enterobacter cloacae complex NOT DETECTED NOT DETECTED Final   Escherichia coli DETECTED (A) NOT DETECTED Final    Comment: CRITICAL RESULT CALLED TO, READ BACK BY AND VERIFIED WITH: PHARMD DEVAN MITCHELL [phone] 1542 BY JRS    Klebsiella aerogenes NOT DETECTED NOT DETECTED Final   Klebsiella oxytoca NOT DETECTED NOT DETECTED Final   Klebsiella pneumoniae NOT DETECTED NOT DETECTED Final   Proteus species NOT DETECTED NOT  DETECTED Final   Salmonella species NOT DETECTED NOT DETECTED Final   Serratia marcescens NOT DETECTED NOT DETECTED Final   Haemophilus influenzae NOT DETECTED NOT DETECTED Final   Neisseria meningitidis NOT DETECTED NOT DETECTED Final   Pseudomonas aeruginosa NOT DETECTED NOT DETECTED Final   Stenotrophomonas maltophilia NOT DETECTED NOT DETECTED Final   Candida albicans NOT DETECTED NOT DETECTED Final   Candida auris NOT DETECTED NOT DETECTED Final   Candida glabrata NOT DETECTED NOT DETECTED Final   Candida krusei NOT DETECTED NOT DETECTED Final   Candida parapsilosis  NOT DETECTED NOT DETECTED Final   Candida tropicalis NOT DETECTED NOT DETECTED Final   Cryptococcus neoformans/gattii NOT DETECTED NOT DETECTED Final   CTX-M ESBL NOT DETECTED NOT DETECTED Final   Carbapenem resistance IMP NOT DETECTED NOT DETECTED Final   Carbapenem resistance KPC NOT DETECTED NOT DETECTED Final   Carbapenem resistance NDM NOT DETECTED NOT DETECTED Final   Carbapenem resist OXA 48 LIKE NOT DETECTED NOT DETECTED Final   Carbapenem resistance VIM NOT DETECTED NOT DETECTED Final    Comment: Performed at [facility], [address] [place], [place] [address]  Culture, blood (Routine X 2) w Reflex to ID Panel     Status: Abnormal   Collection Time: 07/03/22  9:13 PM   Specimen: BLOOD LEFT ARM  Result Value Ref Range Status   Specimen Description   Final    BLOOD LEFT ARM Performed at [facility], [address], [place], [place] [address]    Special Requests   Final    BOTTLES DRAWN AEROBIC AND ANAEROBIC Blood Culture adequate volume Performed at [facility], [address] [place], [place] [address]    Culture  Setup Time   Final    GRAM NEGATIVE RODS Gram Stain Report Called to,Read Back By and Verified With: SHELTON, A. @[phone]  07/04/2022 BY FRATTO, A. IN BOTH AEROBIC AND ANAEROBIC BOTTLES CRITICAL VALUE NOTED.  VALUE IS CONSISTENT WITH PREVIOUSLY REPORTED AND CALLED VALUE.     Culture (A)  Final    ESCHERICHIA COLI SUSCEPTIBILITIES PERFORMED ON PREVIOUS CULTURE WITHIN THE LAST 5 DAYS. Performed at [facility], [address] [place], [place] [address]    Report Status 07/06/2022 FINAL  Final  MRSA Next Gen by PCR, Nasal     Status: None   Collection Time: 07/03/22  9:18 PM   Specimen: Nasal Mucosa; Nasal Swab  Result Value Ref Range Status   MRSA by PCR Next Gen NOT DETECTED NOT DETECTED Final    Comment: (NOTE) The GeneXpert MRSA Assay (FDA approved for NASAL specimens only), is one component of a comprehensive MRSA colonization surveillance program. It is not intended to diagnose MRSA infection nor to guide or monitor treatment for MRSA infections. Test performance is not FDA approved in patients less than 44 years old. Performed at [facility], [address], [place], [place] [address]   Urine Culture     Status: Abnormal   Collection Time: 07/03/22  9:55 PM   Specimen: Urine, Clean Catch  Result Value Ref Range Status   Specimen Description   Final    URINE, CLEAN CATCH Performed at [facility], [address] [place], [place] [address]    Special Requests   Final    NONE Performed at [facility], [address] [place], [place] [address]    Culture >=100,000 COLONIES/mL ESCHERICHIA COLI (A)  Final   Report Status 07/06/2022 FINAL  Final   Organism ID, Bacteria ESCHERICHIA COLI (A)  Final      Susceptibility   Escherichia coli - MIC*    AMPICILLIN 4 SENSITIVE Sensitive     CEFAZOLIN <=4 SENSITIVE Sensitive     CEFEPIME <=0.12 SENSITIVE Sensitive     CEFTRIAXONE <=0.25 SENSITIVE Sensitive     CIPROFLOXACIN <=0.25 SENSITIVE Sensitive     GENTAMICIN <=1 SENSITIVE Sensitive     IMIPENEM <=0.25 SENSITIVE Sensitive     NITROFURANTOIN <=16 SENSITIVE Sensitive     TRIMETH/SULFA <=20 SENSITIVE Sensitive     AMPICILLIN/SULBACTAM <=2 SENSITIVE  Sensitive     PIP/TAZO <=4 SENSITIVE Sensitive     * >=100,000 COLONIES/mL ESCHERICHIA COLI      Scheduled Meds:  amiodarone  200 mg Oral BID   anastrozole  1 mg Oral Daily   apixaban  5 mg Oral BID   Chlorhexidine Gluconate Cloth  6 each Topical Daily   furosemide  40 mg Intravenous BID   insulin aspart  0-9 Units Subcutaneous Q4H   metoprolol tartrate  50 mg Oral BID   mouth rinse  15 mL Mouth Rinse 4 times per day   Continuous Infusions:  cefTRIAXone (ROCEPHIN)  IV Stopped (07/07/22 1001)    Procedures/Studies: DG Pelvis 1-2 Views  Result Date: 07/06/2022 CLINICAL DATA:  Pain EXAM: PELVIS - 1 VIEW COMPARISON:  None Available. FINDINGS: There is no evidence of pelvic fracture or diastasis. No pelvic bone lesions are seen. IMPRESSION: Negative. Electronically Signed   By: Layla Maw M.D.   On: 07/06/2022 10:27   DG FEMUR PORT, MIN 2 VIEWS RIGHT  Result Date: 07/06/2022 CLINICAL DATA:  Fall EXAM: RIGHT FEMUR PORTABLE 2 VIEW COMPARISON:  None Available. FINDINGS: There is no evidence of fracture or other focal bone lesions. Soft tissues are unremarkable. IMPRESSION: Negative. Electronically Signed   By: Layla Maw M.D.   On: 07/06/2022 10:17   [place] EKG SITE RITE  Result Date: 07/05/2022 If Site Rite image not attached, placement could not be confirmed due to current cardiac rhythm.  ECHOCARDIOGRAM COMPLETE  Result Date: 07/05/2022    ECHOCARDIOGRAM REPORT   Patient Name:   QUENESHA DOUGLASS Date of Exam: 07/05/2022 Medical Rec #:  [id number]    Height:       64.0 in Accession #:    [id number]   Weight:       147.9 lb Date of Birth:  1955/05/15   BSA:          1.721 m Patient Age:    66 years     BP:           124/53 mmHg Patient Gender: F            HR:           117 bpm. [facility] Procedure: 2D Echo, Cardiac Doppler and Color Doppler Indications:    CHF-Acute Diastolic I50.31  History:        Patient has prior history of Echocardiogram examinations, most                 recent 01/13/2021. Sepsis, Arrythmias:Atrial Fibrillation,                  Signs/Symptoms:Chest Pain; Risk Factors:Hypertension and                 Diabetes.  Sonographer:    Aron Baba Referring Phys: [phone] Aayden Cefalu  Sonographer Comments: Image acquisition challenging due to patient body habitus and Image acquisition challenging due to respiratory motion. IMPRESSIONS  1. Challenging study for LVEF assessment due to tachycardia. Left ventricular ejection fraction, by estimation, is 45 to 50%. The left ventricle has mildly decreased function. Left ventricular endocardial border not optimally defined to evaluate regional wall motion. Left ventricular diastolic function could not be evaluated due to atrial fibrillation.  2. Right ventricular systolic function was not well visualized. The right ventricular size is mildly enlarged.  3. Left atrial size was moderately dilated.  4. The mitral valve is grossly normal. Trivial mitral valve regurgitation. No evidence of  mitral stenosis.  5. The aortic valve is tricuspid. There is mild calcification of the aortic valve. Aortic valve regurgitation is not visualized. No aortic stenosis is present. Comparison(s): Changes from prior study are noted. LVEF worsened from 55% in 2020 to 45-50% now. FINDINGS  Left Ventricle: Left ventricular ejection fraction, by estimation, is 45 to 50%. The left ventricle has mildly decreased function. Left ventricular endocardial border not optimally defined to evaluate regional wall motion. The left ventricular internal cavity size was normal in size. There is no left ventricular hypertrophy. Left ventricular diastolic function could not be evaluated due to atrial fibrillation. Left ventricular diastolic function could not be evaluated. Right Ventricle: The right ventricular size is mildly enlarged. No increase in right ventricular wall thickness. Right ventricular systolic function was not well visualized. Left Atrium: Left atrial size was moderately dilated. Right Atrium: Right atrial size was normal in size.  Pericardium: There is no evidence of pericardial effusion. Mitral Valve: The mitral valve is grossly normal. Trivial mitral valve regurgitation. No evidence of mitral valve stenosis. Tricuspid Valve: The tricuspid valve is grossly normal. Tricuspid valve regurgitation is mild . No evidence of tricuspid stenosis. Aortic Valve: The aortic valve is tricuspid. There is mild calcification of the aortic valve. Aortic valve regurgitation is not visualized. No aortic stenosis is present. Pulmonic Valve: The pulmonic valve was not well visualized. Pulmonic valve regurgitation is not visualized. No evidence of pulmonic stenosis. Aorta: The aortic root and ascending aorta are structurally normal, with no evidence of dilitation. Venous: The inferior vena cava was not well visualized. IAS/Shunts: The interatrial septum was not well visualized.  LEFT VENTRICLE PLAX 2D LVIDd:         4.20 cm   Diastology LVIDs:         3.20 cm   LV e' medial:    9.25 cm/s LV PW:         1.10 cm   LV E/e' medial:  14.6 LV IVS:        0.70 cm   LV e' lateral:   10.10 cm/s LVOT diam:     1.80 cm   LV E/e' lateral: 13.4 LV SV:         43 LV SV Index:   25 LVOT Area:     2.54 cm  RIGHT VENTRICLE RV S prime:     11.30 cm/s TAPSE (M-mode): 1.7 cm LEFT ATRIUM           Index        RIGHT ATRIUM           Index LA diam:      3.30 cm 1.92 cm/m   RA Area:     16.70 cm LA Vol (A2C): 25.5 ml 14.82 ml/m  RA Volume:   46.90 ml  27.25 ml/m LA Vol (A4C): 76.6 ml 44.51 ml/m  AORTIC VALVE LVOT Vmax:   82.50 cm/s LVOT Vmean:  53.300 cm/s LVOT VTI:    0.170 m  AORTA Ao Root diam: 2.80 cm Ao Asc diam:  3.10 cm MITRAL VALVE                TRICUSPID VALVE MV Area (PHT): 5.54 cm     TR Peak grad:   27.2 mmHg MV Decel Time: 137 msec     TR Vmax:        261.00 cm/s MR Peak grad: 53.4 mmHg MR Vmax:      365.50 cm/s   SHUNTS MV E velocity: 135.00  cm/s  Systemic VTI:  0.17 m                             Systemic Diam: 1.80 cm Vishnu Priya Mallipeddi Electronically signed  by Winfield Rast Mallipeddi Signature Date/Time: 07/05/2022/3:45:55 PM    Final    DG CHEST PORT 1 VIEW  Result Date: 07/05/2022 CLINICAL DATA:  Shortness of breath EXAM: PORTABLE CHEST 1 VIEW COMPARISON:  Yesterday FINDINGS: Cardiomegaly. Interstitial opacity with streaky density in the lower lungs. Small right pleural effusion. No pneumothorax. Porta catheter with tip at the SVC. IMPRESSION: Interstitial and airspace opacity at the bases that could be infection and/or edema. Aeration is worsened from yesterday. Electronically Signed   By: Tiburcio Pea M.D.   On: 07/05/2022 07:56   MM 3D SCREEN BREAST UNI RIGHT  Result Date: 07/04/2022 CLINICAL DATA:  Screening. History left mastectomy 2019. EXAM: DIGITAL SCREENING UNILATERAL RIGHT MAMMOGRAM WITH CAD AND TOMOSYNTHESIS TECHNIQUE: Right screening digital craniocaudal and mediolateral oblique mammograms were obtained. Right screening digital breast tomosynthesis was performed. The images were evaluated with computer-aided detection. COMPARISON:  Previous exam(s). ACR Breast Density Category b: There are scattered areas of fibroglandular density. FINDINGS: There are no findings suspicious for malignancy. Note that a request was initially made to repeat the right CC to obtain more posterior tissue however the patient is currently hospitalized and located in the intensive care unit and therefore would not be able to obtain any additional mammography any time soon. IMPRESSION: No mammographic evidence of malignancy. A result letter of this screening mammogram will be mailed directly to the patient. RECOMMENDATION: Screening mammogram in one year. (Code:[id number]) BI-RADS CATEGORY  1: Negative. Electronically Signed   By: Edwin Cap M.D.   On: 07/04/2022 15:57   DG Chest Port 1 View  Result Date: 07/04/2022 CLINICAL DATA:  67 year old female with history of dyspnea. EXAM: PORTABLE CHEST 1 VIEW COMPARISON:  Chest x-ray 07/03/2022. FINDINGS: Right subclavian  single-lumen power porta cath with tip terminating in the distal superior vena cava. Areas of interstitial prominence and peribronchial cuffing, most evident in the mid to lower lungs bilaterally. Focal opacity in the retrocardiac region on the left likely reflects atelectasis, although developing airspace consolidation is not excluded. No pleural effusions. No pneumothorax. No evidence of pulmonary edema. Heart size is normal. Upper mediastinal contours are within normal limits. IMPRESSION: 1. The appearance of the chest may suggest an acute bronchitis. 2. There is also a retrocardiac opacity in the medial left base which may reflect an area of atelectasis or developing airspace consolidation. Followup PA and lateral chest X-ray is recommended in 3-4 weeks following trial of antibiotic therapy to ensure resolution and exclude underlying malignancy. Electronically Signed   By: Trudie Reed M.D.   On: 07/04/2022 05:45   CT Head Wo Contrast  Result Date: 07/03/2022 CLINICAL DATA:  Nausea and vomiting for 3 days, altered level of consciousness EXAM: CT HEAD WITHOUT CONTRAST TECHNIQUE: Contiguous axial images were obtained from the base of the skull through the vertex without intravenous contrast. RADIATION DOSE REDUCTION: This exam was performed according to the departmental dose-optimization program which includes automated exposure control, adjustment of the mA and/or kV according to patient size and/or use of iterative reconstruction technique. COMPARISON:  None Available. FINDINGS: Brain: Hypodensity in the left occipital periventricular and subcortical white matter consistent with age indeterminate ischemic change. No other signs of acute infarct or hemorrhage. Lateral ventricles and midline structures are  unremarkable. There are no acute extra-axial fluid collections. No mass effect. Vascular: Atherosclerosis of the internal carotid arteries. No hyperdense vessel. Skull: Normal. Negative for fracture or  focal lesion. Sinuses/Orbits: Prominent mucoperiosteal thickening of the bilateral maxillary sinuses, with a small superimposed gas fluid level on the left. Minimal retained secretions within the sphenoid sinus. Other: None. IMPRESSION: 1. Age indeterminate ischemic changes within the left occipital periventricular and subcortical white matter, favor chronic. 2. Otherwise no acute intracranial process. 3. Bilateral maxillary sinus disease, with evidence of superimposed acute left maxillary sinusitis. Electronically Signed   By: Sharlet Salina M.D.   On: 07/03/2022 19:43   DG Chest 2 View  Result Date: 07/03/2022 CLINICAL DATA:  AFib EXAM: CHEST - 2 VIEW COMPARISON:  CXR 06/27/17 FINDINGS: Right-sided chest port in place with the tip in the upper SVC, unchanged from prior exam. No pleural effusion. No pneumothorax. No focal airspace opacity. Normal cardiac and mediastinal contours. No radiographically apparent displaced rib fractures. Visualized upper abdomen is unremarkable. Vertebral body heights are maintained. IMPRESSION: No focal airspace opacity. Electronically Signed   By: Lorenza Cambridge M.D.   On: 07/03/2022 14:44   DG Bone Density  Result Date: 06/26/2022 EXAM: DUAL X-RAY ABSORPTIOMETRY (DXA) FOR BONE MINERAL DENSITY IMPRESSION: Your patient Ivannia Willhelm completed a BMD test on 06/26/2022 using the [employer] DXA System (software version: 14.10) manufactured by [employer]. The following summarizes the results of our evaluation. Technologist: [facility] PATIENT BIOGRAPHICAL: Name: Tarahji, Ramthun Patient ID: [id number] Birth Date: 1955/07/12 Height: 64.0 in. Gender: Female Exam Date: 06/26/2022 Weight: 151.0 lbs. Indications: Hx Breast Ca, Caucasian, Secondary Osteoporosis, Follow up Osteopenia, Post Menopausal Fractures: Treatments: Calcium, Anastrozole, Vitamin D DENSITOMETRY RESULTS: Site         Region     Measured Date Measured Age WHO Classification Young Adult T-score BMD         %Change vs.  Previous Significant Change (*) DualFemur Neck Left 06/26/2020 68.3 Osteopenia -1.9 0.773 g/cm2 -5.3% Yes DualFemur Neck Left 06/18/2020 66.2 Osteopenia -1.6 0.816 g/cm2 2.6% - DualFemur Neck Left 03/07/2018 64.0 Osteopenia -1.8 0.795 g/cm2 - - DualFemur Total Mean 06/26/2022 68.3 Osteopenia -1.3 0.838 g/cm2 0.0% - DualFemur Total Mean 06/18/2020 66.2 Osteopenia -1.3 0.838 g/cm2 -2.9% Yes DualFemur Total Mean 03/07/2018 64.0 - - 0.863 g/cm2 - - Left Forearm Radius 33% 06/26/2022 68.3 Osteopenia -1.4 0.616 g/cm2 -7.8% Yes Left Forearm Radius 33% 06/18/2020 66.2 Normal -0.6 0.668 g/cm2 -9.6% Yes Left Forearm Radius 33% 03/07/2018 64.0 Normal 0.4 0.739 g/cm2 - - ASSESSMENT: The BMD measured at Femur Neck Left is 0.773 g/cm2 with a T-score of -1.9. This patient is considered osteopenic according to World Health Organization [facility]) criteria. The scan quality is good. Compared with the prior study on 06/18/20, the BMD of the total mean shows no statistically significant change, however, the lt. forearm shows a significant decrease. Lumbar spine was excluded due to advanced degenerative changes. World Science writer [facility]) criteria for post-menopausal, Caucasian Women: Normal:       T-score at or above -1 SD Osteopenia:   T-score between -1 and -2.5 SD Osteoporosis: T-score at or below -2.5 SD RECOMMENDATIONS: 1. All patients should optimize calcium and vitamin D intake. 2. Consider FDA-approved medical therapies in postmenopausal women and med aged 61 years and older, based on the following: a. A hip or vertebral (clinical or morphometric) fracture b. T-score< -2.5 at the femoral neck or spine after appropriate evaluation to exclude secondary causes c. Low bone mass (  T-score between -1.0 and -2.5 at the femoral neck or spine) and a 10-year probability of a hip fracture > 3% or a 10-year probability of a major osteoporosis-related fracture > 20% based on the [place]-adapted WHO algorithm d. Clinician judgment and/or patient  preferences may indicate treatment for people with 10-year fracture probabilities above or below these levels FOLLOW-UP: Patients with diagnosis of osteoporsis or at high risk for fracture should have regular bone mineral density tests. For patients eligible for Medicare, routine testing is allowed once every 2 years. The testing frequency can be increased to one year for patients who have rapidly progressing disease, those who are receiving or discontinuing medical therapy to restore bone mass, or have additional risk factors. I have reviewed this report, and agree with the above findings. [facility] Your patient MARONDA CAISON completed a FRAX assessment on 06/26/2022 using the [employer] DXA System (analysis version: 14.10) manufactured by [employer]. The following summarizes the results of our evaluation. PATIENT BIOGRAPHICAL: Name: Taliah, Porche Patient ID: [id number] Birth Date: Feb 24, 1956 Height:    64.0 in. [age]:     [age]    [age]       Weight:    151.0 lbs. Ethnicity:  White                            Exam Date: 06/26/2022 FRAX* RESULTS:  (version: 3.5) 10-year Probability of Fracture1 Major Osteoporotic Fracture2 Hip Fracture 11.3% 1.9% Population: [place] (Caucasian) Risk Factors: Secondary Osteoporosis Based on DualFemur (Left) Neck BMD 1 -The 10-year probability of fracture may be lower than reported if the patient has received treatment. 2 -Major Osteoporotic Fracture: Clinical Spine, Forearm, Hip or Shoulder *FRAX is a Armed forces logistics/support/administrative officer of the [employer] of [employer] for Metabolic Bone Disease, a World Science writer (WHO) [employer]. ASSESSMENT: The probability of a major osteoporotic fracture is 11.3% within the next ten years. The probability of a hip fracture is 1.9% within the next ten years. Electronically Signed   By: Gerome Sam III M.D.   On: 06/26/2022 09:39    Catarina Hartshorn, DO  [facility]  If 7PM-7AM, please contact  night-coverage [email or web address] Password [email or web address] 07/07/2022, 1:13 PM   LOS: 4 days

## 2022-07-07 NOTE — Progress Notes (Signed)
No noted BM's documented in 72 hours. PRN Miralax given. Dr Tat also made aware. Patient denies any abdomen discomfort at this time and IS passing gas.

## 2022-07-07 NOTE — TOC Progression Note (Addendum)
Transition of Care [facility]) - Progression Note    Patient Details  Name: Lisa Thornton MRN: [id number] Date of Birth: 08-Sep-1955  [facility]) CM/SW Contact  Annice Needy, LCSW Phone Number: 07/07/2022, 4:41 PM  Clinical Narrative:    PT recommends SNF. Patient states that at baseline, she lives with her sister. She is unsure if she wants to go to SNF and wants to speak with her family before making the decision. Patient indicates that she does not want to go to a facility where she will have to have had the COVID vaccination should she decide to go. Auth started.   Expected Discharge Plan: Skilled Nursing Facility Barriers to Discharge: Continued Medical Work up  Expected Discharge Plan and Services     Post Acute Care Choice: Skilled Nursing Facility                                         Social Determinants of Health (SDOH) Interventions SDOH Screenings   Food Insecurity: No Food Insecurity (07/03/2022)  Housing: Low Risk  (07/03/2022)  Transportation Needs: No Transportation Needs (07/03/2022)  Utilities: Not At Risk (07/03/2022)  Alcohol Screen: Low Risk  (03/25/2020)  Depression (PHQ2-9): Low Risk  (06/28/2022)  Financial Resource Strain: Low Risk  (03/29/2021)  Physical Activity: Insufficiently Active (03/29/2021)  Social Connections: Socially Isolated (03/29/2021)  Stress: No Stress Concern Present (03/29/2021)  Tobacco Use: Low Risk  (07/03/2022)    Readmission Risk Interventions     No data to display

## 2022-07-07 NOTE — Consult Note (Addendum)
Cardiology Consultation   Patient ID: [name] MRN: [id number]; DOB: Jul 26, 1955  Admit date: 07/03/2022 Date of Consult: 07/07/2022  PCP:  Anabel Halon, MD   [facility] Providers Cardiologist:  Lisa Bicker, MD        Patient Profile:   Lisa Thornton is a 67 y.o. female with a hx of paroxysmal atrial fibrillation, HTN, Type 2 DM, anemia and history of breast cancer who is being seen 07/07/2022 for the evaluation of atrial fibrillation and acute HFmrEF at the request of Lisa Thornton.  History of Present Illness:   Lisa Thornton was examined by Dr. Jenene Thornton in 02/2022 as a referral for atrial fibrillation and she denied any recent chest pain or palpitations at that time. Her heart rate was well-controlled in the 80's and she was continued on Lopressor 50 mg twice daily and Eliquis for anticoagulation.  She presented to [facility] ED on 07/03/2022 for evaluation of nausea and vomiting for the past 3 days.  Initial labs showed WBC 17.9, Hgb 10.7, platelets 156, Na+ 131, K+ 3.1 and creatinine 1.40 (baseline 0.9-1.0).  Albumin low at 2.9.  AST mildly elevated at 45 with ALT normal at 22. Magnesium 1.1.  Negative for COVID and influenza. Initial lactic acid was elevated at 3.0 with repeat at 2.4 and 1.6.  CXR showed no focal airspace disease. CT Head showed age-indeterminate ischemic changes within the left septal periventricular and subcortical white matter but no other acute intracranial processes. EKG showed atrial fibrillation with RVR, heart rate 175 with ST depression along the lateral leads.  She was admitted for further management of atrial fibrillation with RVR and started on IV Cardizem.  Also had acute hypoxic respiratory failure requiring intermittent use of BiPAP. Given that she met sepsis criteria on admission, was started on broad-spectrum antibiotics with Vancomycin, Cefepime and Metronidazole. Was found to ultimately have E. coli bacteremia and antibiotic coverage has  been adjusted. Repeat CXR did show increased interstitial prominence and right lower lobe opacity with PCT elevated to 69, therefore she is being treated for pneumonia as well.  In regards to her atrial fibrillation, she developed hypotension with IV Cardizem and was transitioned to IV Amiodarone. Has been restarted on Lopressor 50 mg twice daily. She initially received fluids due to sepsis criteria but has been felt to be volume overloaded over the past few days and was started on IV Lasix 40mg  daily. While she diuresed a net negative of -1.5 L yesterday, she is still + 4.2 L this admission.  Repeat echocardiogram this admission did show her EF was mildly reduced at 45 to 50% and RV function was not well-visualized.  The study was overall limited due to tachycardia.  She had trivial MR but no significant valve abnormalities.  In talking with the patient today, she denies any specific chest pain or palpitations. Still has some dyspnea with minimal activity and orthopnea. Was taken off BiPAP yesterday and has remained off. On 5 L [place] currently. Worked with PT and by review of notes is anticipating discharge to SNF.    Past Medical History:  Diagnosis Date   Anemia 12/31/2017   Cancer    left breast cancer   Chest pain    Diabetes mellitus without complication    Hypertension     Past Surgical History:  Procedure Laterality Date   COLONOSCOPY WITH PROPOFOL N/A 09/27/2021   Procedure: COLONOSCOPY WITH PROPOFOL;  Surgeon: Dolores Frame, MD;  Location: [facility];  Service: Gastroenterology;  Laterality: N/A;  945   MASTECTOMY MODIFIED RADICAL Left 12/31/2017   Procedure: LEFT MODIFIED RADICAL MASTECTOMY;  Surgeon: Franky Macho, MD;  Location: [facility] ORS;  Service: General;  Laterality: Left;   POLYPECTOMY  09/27/2021   Procedure: POLYPECTOMY;  Surgeon: Dolores Frame, MD;  Location: [facility];  Service: Gastroenterology;;   PORTACATH PLACEMENT Right 06/27/2017   Procedure:  INSERTION PORT-A-CATH;  Surgeon: Franky Macho, MD;  Location: [facility] ORS;  Service: General;  Laterality: Right;     Home Medications:  Prior to Admission medications   Medication Sig Start Date End Date Taking? Authorizing Provider  Acetaminophen-DM (CORICIDIN HBP COLD/COUGH/FLU PO) Take 1 tablet by mouth daily as needed (cough).   Yes [provider]  amLODipine (NORVASC) 5 MG tablet Take 1 tablet (5 mg total) by mouth daily. 04/26/22  Yes Anabel Halon, MD  anastrozole (ARIMIDEX) 1 MG tablet Take 1 tablet by mouth once daily 04/17/22  Yes Pennington, [name] M, PA-C  apixaban (ELIQUIS) 5 MG TABS tablet Take 1 tablet (5 mg total) by mouth 2 (two) times daily. 04/26/22  Yes Anabel Halon, MD  calcium carbonate (OS-CAL) 600 MG TABS tablet Take 600 mg by mouth daily with breakfast.   Yes [provider]  cholecalciferol (VITAMIN D3) 25 MCG (1000 UT) tablet Take 1,000 Units by mouth 2 (two) times daily.    Yes [provider]  hydrochlorothiazide (HYDRODIURIL) 25 MG tablet Take 1 tablet by mouth once daily 04/17/22  Yes Anabel Halon, MD  metFORMIN (GLUCOPHAGE) 1000 MG tablet TAKE 1 TABLET BY MOUTH BEFORE BREAKFAST Patient taking differently: Take 1,000 mg by mouth daily with breakfast. 04/17/22  Yes Anabel Halon, MD  metoprolol tartrate (LOPRESSOR) 50 MG tablet Take 1 tablet by mouth twice daily 03/13/22  Yes Patel, Earlie Lou, MD  Omega-3 Fatty Acids (FISH OIL) 1000 MG CAPS Take 1 capsule by mouth 2 (two) times daily.   Yes [provider]  rosuvastatin (CRESTOR) 5 MG tablet Take 1 tablet (5 mg total) by mouth daily. 12/08/21  Yes Patel, Earlie Lou, MD  Semaglutide (RYBELSUS) 7 MG TABS Take 1 tablet (7 mg total) by mouth daily. 04/26/22  Yes Anabel Halon, MD  Accu-Chek Softclix Lancets lancets USE 1  TO CHECK GLUCOSE UP TO 4 TIMES DAILY 08/16/20   Heather Roberts, NP  blood glucose meter kit and supplies Dispense based on patient and insurance preference. Use up to  four times daily as directed. (FOR ICD-10 E10.9, E11.9). 10/29/19   Freddy Finner, NP  glucose blood (ACCU-CHEK GUIDE) test strip USE 1 STRIP TO CHECK GLUCOSE THREE TIMES DAILY IN THE MORNING, NOON, AND AT BEDTIME AS DIRECTED 04/17/22   Anabel Halon, MD  ondansetron (ZOFRAN) 4 MG tablet Take 1 tablet (4 mg total) by mouth every 8 (eight) hours as needed for nausea or vomiting. Patient not taking: Reported on 07/03/2022 05/09/21   Doreatha Massed, MD  sulfamethoxazole-trimethoprim (BACTRIM DS) 800-160 MG tablet Take 1 tablet by mouth 2 (two) times daily. Patient not taking: Reported on 07/03/2022 04/26/22   Anabel Halon, MD    Inpatient Medications: Scheduled Meds:  anastrozole  1 mg Oral Daily   apixaban  5 mg Oral BID   Chlorhexidine Gluconate Cloth  6 each Topical Daily   furosemide  40 mg Intravenous Daily   insulin aspart  0-9 Units Subcutaneous Q4H   metoprolol tartrate  50 mg Oral BID   mouth rinse  15 mL Mouth Rinse 4 times per day   Continuous Infusions:  amiodarone 30 mg/hr (07/07/22 0101)   cefTRIAXone (ROCEPHIN)  IV 2 g (07/07/22 0931)   magnesium sulfate bolus IVPB 2 g (07/07/22 0830)   PRN Meds: acetaminophen **OR** acetaminophen, ondansetron **OR** ondansetron (ZOFRAN) IV, mouth rinse, oxyCODONE-acetaminophen, polyethylene glycol  Allergies:   No Known Allergies  Social History:   Social History   Socioeconomic History   Marital status: Divorced    Spouse name: Not on file   Number of children: 2   Years of education: 10   Highest education level: 10th grade  Occupational History   Occupation: Psychologist, 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  Tobacco Use   Smoking status: Never   Smokeless tobacco: Never  Vaping Use   Vaping Use: Never used  Substance and Sexual Activity   Alcohol use: No   Drug use: No   Sexual activity: Not Currently  Other Topics Concern   Not on file  Social History Narrative   Lives with sister   2 children   Dog: Cozie      Enjoys: puzzles, sewing, and walk       Diet: eats all food groups outside: leafy greens    Caffeine: limited, sweet tea   Water: 6-8 cups daily       No car; does have license, wears seat belt   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   [place] area    Social Determinants of Health   Financial Resource Strain: Low Risk  (03/29/2021)   Overall Financial Resource Strain (CARDIA)    Difficulty of Paying Living Expenses: Not hard at all  Food Insecurity: No Food Insecurity (07/03/2022)   Hunger Vital Sign    Worried About Running Out of Food in the Last Year: Never true    Ran Out of Food in the Last Year: Never true  Transportation Needs: No Transportation Needs (07/03/2022)   PRAPARE - Administrator, Civil Service (Medical): No    Lack of Transportation (Non-Medical): No  Physical Activity: Insufficiently Active (03/29/2021)   Exercise Vital Sign    Days of Exercise per Week: 7 days    Minutes of Exercise per Session: 20 min  Stress: No Stress Concern Present (03/29/2021)   [employer] of Occupational Health - Occupational Stress Questionnaire    Feeling of Stress : Not at all  Social Connections: Socially Isolated (03/29/2021)   Social Connection and Isolation Panel [NHANES]    Frequency of Communication with Friends and Family: More than three times a week    Frequency of Social Gatherings with Friends and Family: Once a week    Attends Religious Services: Never    Database administrator or Organizations: No    Attends Banker Meetings: Never    Marital Status: Divorced  Catering manager Violence: Patient Unable To Answer (07/03/2022)   Humiliation, Afraid, Rape, and Kick questionnaire    Fear of Current or Ex-Partner: Patient unable to answer    Emotionally Abused: Patient unable to answer    Physically Abused: Patient unable to answer    Sexually Abused: Patient unable to answer    Family History:    Family History  Problem Relation Age of Onset   Breast cancer Mother    Thyroid  disease Mother    Heart disease Mother    Heart attack Father    Heart attack Sister    Hypertension Sister    Heart attack Brother    Cancer Sister  Stroke Brother    Alzheimer's disease Maternal Aunt      ROS:  Please see the history of present illness.   All other ROS reviewed and negative.     Physical Exam/Data:   Vitals:   07/07/22 0800 07/07/22 0842 07/07/22 0900 07/07/22 0922  BP: (!) 115/52  (!) 116/39 (!) 124/40  Pulse: 87  (!) 101 100  Resp: 17  20   Temp:      TempSrc:      SpO2: 97% 95% 90%   Weight:      Height:        Intake/Output Summary (Last 24 hours) at 07/07/2022 0932 Last data filed at 07/07/2022 0103 Gross per 24 hour  Intake 240 ml  Output 1750 ml  Net -1510 ml      07/07/2022    5:59 AM 07/03/2022   10:06 PM 07/03/2022    1:43 PM  Last 3 Weights  Weight (lbs) 150 lb 2.1 oz 147 lb 14.9 oz 145 lb 8.1 oz  Weight (kg) 68.1 kg 67.1 kg 66 kg     Body mass index is 25.77 kg/m.  General:  Well nourished, well developed female appearing in no acute distress. HEENT: normal Neck: JVD elevated to jaw.  Vascular: No carotid bruits; Distal pulses 2+ bilaterally Cardiac:  normal S1, S2; Irregularly irregular.  Lungs: decreased breath sounds along bases bilaterally.  Abd: soft, nontender, no hepatomegaly  Ext: trace lower extremity edema Musculoskeletal:  No deformities, BUE and BLE strength normal and equal Skin: warm and dry  Neuro:  CNs 2-12 intact, no focal abnormalities noted Psych:  Normal affect   Telemetry:  Telemetry was personally reviewed and demonstrates: Atrial fibrillation, HR in 80's to 90's.   Relevant CV Studies:  Echocardiogram: 07/05/2022 IMPRESSIONS     1. Challenging study for LVEF assessment due to tachycardia. Left  ventricular ejection fraction, by estimation, is 45 to 50%. The left  ventricle has mildly decreased function. Left ventricular endocardial  border not optimally defined to evaluate  regional wall motion.  Left ventricular diastolic function could not be  evaluated due to atrial fibrillation.   2. Right ventricular systolic function was not well visualized. The right  ventricular size is mildly enlarged.   3. Left atrial size was moderately dilated.   4. The mitral valve is grossly normal. Trivial mitral valve  regurgitation. No evidence of mitral stenosis.   5. The aortic valve is tricuspid. There is mild calcification of the  aortic valve. Aortic valve regurgitation is not visualized. No aortic  stenosis is present.   Comparison(s): Changes from prior study are noted. LVEF worsened from 55%  in 2020 to 45-50% now.   Laboratory Data:  High Sensitivity Troponin:   Recent Labs  Lab 07/03/22 1410 07/03/22 1600  TROPONINIHS 11 11     Chemistry Recent Labs  Lab 07/04/22 0320 07/05/22 0508 07/06/22 0546 07/07/22 0454  NA 133* 135 133* 132*  K 4.3 3.8 3.5 3.1*  CL 104 106 100 98  CO2 21* 19* 24 23  GLUCOSE 205* 133* 113* 113*  BUN 30* 24* 20 23  CREATININE 1.29* 1.03* 0.93 0.95  CALCIUM 7.8* 8.4* 8.7* 8.3*  MG 2.1  --  1.4* 1.8  GFRNONAA 45* 59* >60 >60  ANIONGAP 8 10 9 11     Recent Labs  Lab 07/03/22 1410 07/05/22 0508 07/06/22 0546  PROT 8.1 7.3 6.9  ALBUMIN 2.9* 2.4* 2.1*  AST 45* 32 26  ALT 22 16  16  ALKPHOS 51 59 62  BILITOT 0.9 1.3* 0.7   Lipids No results for input(s): "CHOL", "TRIG", "HDL", "LABVLDL", "LDLCALC", "CHOLHDL" in the last 168 hours.  Hematology Recent Labs  Lab 07/04/22 0320 07/05/22 0508 07/06/22 0546  WBC 11.4* 9.8 11.1*  RBC 2.98* 3.28* 3.22*  HGB 9.2* 10.1* 9.9*  HCT 27.5* 30.2* 28.7*  MCV 92.3 92.1 89.1  MCH 30.9 30.8 30.7  MCHC 33.5 33.4 34.5  RDW 13.8 13.9 13.9  PLT 144* 148* 167   Thyroid No results for input(s): "TSH", "FREET4" in the last 168 hours.  BNPNo results for input(s): "BNP", "PROBNP" in the last 168 hours.  DDimer No results for input(s): "DDIMER" in the last 168 hours.   Radiology/Studies:  DG Pelvis 1-2  Views  Result Date: 07/06/2022 CLINICAL DATA:  Pain EXAM: PELVIS - 1 VIEW COMPARISON:  None Available. FINDINGS: There is no evidence of pelvic fracture or diastasis. No pelvic bone lesions are seen. IMPRESSION: Negative. Electronically Signed   By: Layla Maw M.D.   On: 07/06/2022 10:27   DG FEMUR PORT, MIN 2 VIEWS RIGHT  Result Date: 07/06/2022 CLINICAL DATA:  Fall EXAM: RIGHT FEMUR PORTABLE 2 VIEW COMPARISON:  None Available. FINDINGS: There is no evidence of fracture or other focal bone lesions. Soft tissues are unremarkable. IMPRESSION: Negative. Electronically Signed   By: Layla Maw M.D.   On: 07/06/2022 10:17   [place] EKG SITE RITE  Result Date: 07/05/2022 If Site Rite image not attached, placement could not be confirmed due to current cardiac rhythm.  DG CHEST PORT 1 VIEW  Result Date: 07/05/2022 CLINICAL DATA:  Shortness of breath EXAM: PORTABLE CHEST 1 VIEW COMPARISON:  Yesterday FINDINGS: Cardiomegaly. Interstitial opacity with streaky density in the lower lungs. Small right pleural effusion. No pneumothorax. Porta catheter with tip at the SVC. IMPRESSION: Interstitial and airspace opacity at the bases that could be infection and/or edema. Aeration is worsened from yesterday. Electronically Signed   By: Tiburcio Pea M.D.   On: 07/05/2022 07:56   DG Chest Port 1 View  Result Date: 07/04/2022 CLINICAL DATA:  67 year old female with history of dyspnea. EXAM: PORTABLE CHEST 1 VIEW COMPARISON:  Chest x-ray 07/03/2022. FINDINGS: Right subclavian single-lumen power porta cath with tip terminating in the distal superior vena cava. Areas of interstitial prominence and peribronchial cuffing, most evident in the mid to lower lungs bilaterally. Focal opacity in the retrocardiac region on the left likely reflects atelectasis, although developing airspace consolidation is not excluded. No pleural effusions. No pneumothorax. No evidence of pulmonary edema. Heart size is normal. Upper mediastinal  contours are within normal limits. IMPRESSION: 1. The appearance of the chest may suggest an acute bronchitis. 2. There is also a retrocardiac opacity in the medial left base which may reflect an area of atelectasis or developing airspace consolidation. Followup PA and lateral chest X-ray is recommended in 3-4 weeks following trial of antibiotic therapy to ensure resolution and exclude underlying malignancy. Electronically Signed   By: Trudie Reed M.D.   On: 07/04/2022 05:45   CT Head Wo Contrast  Result Date: 07/03/2022 CLINICAL DATA:  Nausea and vomiting for 3 days, altered level of consciousness EXAM: CT HEAD WITHOUT CONTRAST TECHNIQUE: Contiguous axial images were obtained from the base of the skull through the vertex without intravenous contrast. RADIATION DOSE REDUCTION: This exam was performed according to the departmental dose-optimization program which includes automated exposure control, adjustment of the mA and/or kV according to patient size and/or use of  iterative reconstruction technique. COMPARISON:  None Available. FINDINGS: Brain: Hypodensity in the left occipital periventricular and subcortical white matter consistent with age indeterminate ischemic change. No other signs of acute infarct or hemorrhage. Lateral ventricles and midline structures are unremarkable. There are no acute extra-axial fluid collections. No mass effect. Vascular: Atherosclerosis of the internal carotid arteries. No hyperdense vessel. Skull: Normal. Negative for fracture or focal lesion. Sinuses/Orbits: Prominent mucoperiosteal thickening of the bilateral maxillary sinuses, with a small superimposed gas fluid level on the left. Minimal retained secretions within the sphenoid sinus. Other: None. IMPRESSION: 1. Age indeterminate ischemic changes within the left occipital periventricular and subcortical white matter, favor chronic. 2. Otherwise no acute intracranial process. 3. Bilateral maxillary sinus disease, with  evidence of superimposed acute left maxillary sinusitis. Electronically Signed   By: Sharlet SalinaMichael  Brown M.D.   On: 07/03/2022 19:43   DG Chest 2 View  Result Date: 07/03/2022 CLINICAL DATA:  AFib EXAM: CHEST - 2 VIEW COMPARISON:  CXR 06/27/17 FINDINGS: Right-sided chest port in place with the tip in the upper SVC, unchanged from prior exam. No pleural effusion. No pneumothorax. No focal airspace opacity. Normal cardiac and mediastinal contours. No radiographically apparent displaced rib fractures. Visualized upper abdomen is unremarkable. Vertebral body heights are maintained. IMPRESSION: No focal airspace opacity. Electronically Signed   By: Lorenza CambridgeHemant  Desai M.D.   On: 07/03/2022 14:44     Assessment and Plan:   1. Atrial Fibrillation with RVR - She has a known history of paroxysmal atrial fibrillation (possibly persistent given this has been noted on EKG tracings since last year) but was in atrial fibrillation with RVR this admission, likely triggered by her acute illness. She has been on IV Amiodarone as she had hypotension with Cardizem. Given her rates have been well-controlled in the 80's, will stop IV Amiodarone (received IV dosing for 2 days) and switch to PO Amiodarone 200mg  BID. Can likely continue this for 2 weeks and then reduce to 200mg  daily. Once further out from her acute illness, would consider stopping. Continue Lopressor 50mg  BID for rate-control.  - She remains on Eliquis 5mg  BID for anticoagulation given her CHA2DS2-VASc Score of at least 4 (HTN, DM, Age, Female).   2. Acute HFmrEF - Her EF was at 55-60% in 01/2019 and at 45-50% this admission but image quality was not ideal given her tachycardia at that time. She did receive IV fluids in the setting of sepsis and is now being diuresed. Will increase IV Lasix from 40mg  daily to 40mg  BID as she is still + 4.2 L this admission. Follow I&O's and daily weights.  - Discussed with Dr. Jenene SlickerMallipeddi and will obtain a limited echo as an outpatient  prior to initiating GDMT. If EF remains reduced, would add at that time.   3. Sepsis in the setting of PNA and E.coli Bacteremia - WBC peaked at 17.9, improving to 11.1 today. Remains on Rocephin. Management per the admitting team.   4. Hypokalemia/Hypomagnesemia - Mg was at 1.1 on admission, improved to 1.8 today. Keep Mg ~ 2.0. K+ was low at 3.1 today and supplementation has already been ordered by the Hospitalist.    For questions or updates, please contact [facility] Please consult [email or web address] for contact info under    Signed, Ellsworth LennoxBrittany M Strader, PA-C  07/07/2022 9:32 AM   Attending attestation  Patient seen and independently examined with Randall AnBrittany Strader, PA-C. We discussed all aspects of the encounter. I agree with the assessment and plan as stated above.  Patient is a 67 year old F known to have paroxysmal A-fib, HTN, DM 2, history of breast cancer presented to ER with DOE, N and V times few days prior to presentation. She was admitted to hospitalist team for the management of acute hypoxic respiratory failure secondary to heart failure exacerbation on BiPAP, A-fib with RVR initially on Cardizem which was switched to IV amiodarone drip due to hypotension with Cardizem and E. coli bacteremia on antibiotics. Echo showed LVEF 45 to 50% the setting of tachycardia, 120s. Her prior echo showed normal LVEF.  Patient denies any symptoms now, has improved breathing compared to prior. Physical examination is remarkable for patient not in acute respite distress, JVD elevated till halfway of the neck, 10-11 mm Hg, S1-S2 heard, no S3, clear lungs, nondistended and nontender abdomen and trace pitting edema in bilateral lower extremities. I personally reviewed the EKG which showed A-fib with RVR, HR 123. Telemetry reviewed, in atrial fibrillation, HR 80-100s.  #Acute HFmrEF, mildly decompensated: JVD 10-11 mm Hg, increased the frequency of IV Lasix from 40 mg once daily to twice daily.   Echo obtained during this admission was performed during A-fib with RVR which was challenging for LVEF assessment. Will obtain limited echo in the outpatient setting and if it shows persistently decreased LVEF, will initiate GDMT.  For now, continue Lopressor 50 mg twice daily. # A-fib with RVR, HR 80-100: Initially on Cardizem drip which was switched to amiodarone drip due to low blood pressures.  Switch amiodarone drip to p.o. amiodarone 200 mg twice daily x 3 weeks followed by 200 mg once daily. Continue Lopressor 50 mg twice daily for rate control and Eliquis 5 mg twice daily.  I have spent a total of 70 minutes with patient reviewing chart , telemetry, EKGs, labs and examining patient as well as establishing an assessment and plan that was discussed with the patient.  > 50% of time was spent in direct patient care.    Eshaal Duby Verne Spurr, MD   [facility]  11:37 AM

## 2022-07-08 DIAGNOSIS — I4891 Unspecified atrial fibrillation: Secondary | ICD-10-CM | POA: Diagnosis not present

## 2022-07-08 DIAGNOSIS — J181 Lobar pneumonia, unspecified organism: Secondary | ICD-10-CM | POA: Diagnosis not present

## 2022-07-08 DIAGNOSIS — N179 Acute kidney failure, unspecified: Secondary | ICD-10-CM | POA: Diagnosis not present

## 2022-07-08 DIAGNOSIS — R7881 Bacteremia: Secondary | ICD-10-CM | POA: Diagnosis not present

## 2022-07-08 LAB — GLUCOSE, CAPILLARY
Glucose-Capillary: 114 mg/dL — ABNORMAL HIGH (ref 70–99)
Glucose-Capillary: 162 mg/dL — ABNORMAL HIGH (ref 70–99)
Glucose-Capillary: 146 mg/dL — ABNORMAL HIGH (ref 70–99)
Glucose-Capillary: 110 mg/dL — ABNORMAL HIGH (ref 70–99)

## 2022-07-08 LAB — BASIC METABOLIC PANEL
Anion gap: 10 (ref 5–15)
BUN: 22 mg/dL (ref 8–23)
CO2: 28 mmol/L (ref 22–32)
Calcium: 8.5 mg/dL — ABNORMAL LOW (ref 8.9–10.3)
Chloride: 95 mmol/L — ABNORMAL LOW (ref 98–111)
Creatinine, Ser: 0.96 mg/dL (ref 0.44–1.00)
GFR, Estimated: >60 mL/min (ref >60)
Glucose, Bld: 127 mg/dL — ABNORMAL HIGH (ref 70–99)
Potassium: 2.9 mmol/L — ABNORMAL LOW (ref 3.5–5.1)
Sodium: 133 mmol/L — ABNORMAL LOW (ref 135–145)

## 2022-07-08 LAB — MAGNESIUM: Magnesium: 1.5 mg/dL — ABNORMAL LOW (ref 1.7–2.4)

## 2022-07-08 MED ORDER — POTASSIUM CHLORIDE CRYS ER 20 MEQ PO TBCR
40.0000 meq | EXTENDED_RELEASE_TABLET | Freq: Once | ORAL | Status: AC
  Administered 2022-07-08: 40 meq via ORAL
  Filled 2022-07-08: qty 2

## 2022-07-08 MED ORDER — POTASSIUM CHLORIDE CRYS ER 20 MEQ PO TBCR
40.0000 meq | EXTENDED_RELEASE_TABLET | Freq: Two times a day (BID) | ORAL | Status: DC
  Administered 2022-07-09 – 2022-07-10 (×3): 40 meq via ORAL
  Filled 2022-07-08 (×3): qty 2

## 2022-07-08 MED ORDER — MAGNESIUM SULFATE 2 GM/50ML IV SOLN
2.0000 g | Freq: Once | INTRAVENOUS | Status: AC
  Administered 2022-07-08: 2 g via INTRAVENOUS
  Filled 2022-07-08: qty 50

## 2022-07-08 NOTE — Progress Notes (Signed)
Patient currently not on CPAP/BIPAP at this time and continues to do well with no distress. RT will continue to have a unit on stand by for patient if needed.

## 2022-07-08 NOTE — Progress Notes (Signed)
   07/08/22 1700  ReDS Vest / Clip  Station Marker A  Ruler Value 42  ReDS Value Range < 36  ReDS Actual Value 24  Anatomical Comments Sitting up on bedside commode

## 2022-07-08 NOTE — Progress Notes (Signed)
PROGRESS NOTE  Lisa Thornton ZOX:[id number] DOB: 30-Aug-1955 DOA: 07/03/2022 PCP: Anabel HalonPatel, Rutwik K, MD  Brief History:  67 y.o. female with medical history significant for   Atria fibrillation, hypertension, diabetes mellitus, breast cancer. Patient was brought to the ED reports of nausea vomiting over the past 3 days, and generalized weakness.  Went to the urgent care and was referred to the ED. At the time of my evaluation, patient is a bit confused and denies symptoms previously reported, answers a few questions.  Patient's grand niece-[name] is at bedside and provides most of the history.  She reports that over the past 3 days, patient has had 1-2 episodes of vomiting daily.  No diarrhea.  No abdominal pain.  Blood sugars were in the 400s yesterday.  Patient denies chest pain or urinary symptoms.  It is unknown if she has been taking her medications over the past few days.  Patient's nurse reported today for in the ED, patient's speech has become a bit confused, like talking about her mother who died in 2017.   ED Course: Tmax 99.9.  Heart rate up to 150s.  Respiratory rate 18- 33.  Blood pressure systolic 102 -135.  Potassium 3.1.  Magnesium 1.1.  Chest x-ray clear.  WBC 17.9.  Creatinine 1.4.  Cardizem drip started with improvement in heart rate.  1 L bolus given.  Magnesium and potassium supplementation started.  Hospitalist to admit for atrial fibrillation with RVR, possible encephalopathy.  Patient was initially started on vancomycin, cefepime, metronidazole.  Blood cultures and urine cultures grew E. coli.  Her antibiotics were de-escalated to ceftriaxone.  Chest x-ray showed right lower lobe opacity.  She was treated for pneumonia.  After fluid resuscitation, the patient became fluid overloaded.  She was started on IV Lasix.  She developed atrial fibrillation with RVR.  She was initially started on diltiazem drip.  This was transitioned to amiodarone secondary to soft blood pressure.    As her sepsis physiology resolved, her atrial fibrillation improved.   Assessment/Plan: Acute respiratory failure with hypoxia -Secondary to pulmonary edema and pneumonia -Currently on BiPAP -Wean oxygen as tolerated -now weaned to 5L>>3L   Lobar pneumonia -Personally reviewed chest x-ray--increased interstitial prominence, right lower lobe opacity, left retrocardiac opacity -PCT 69.47 -Continue ceftriaxone   Severe sepsis -Secondary to pneumonia bacteremia -Present on admission -Meeting severe criteria with tachycardia heart rates up to 150s, ranging from 180-158, tachypnea respirate rate 18-33, leukocytosis of 17.9.  -Lactic acid peaked 2.0 -sepsis physiology resolved   Atrial fibrillation with RVR, type unspecified -d/c diltiazem drip -amiodarone drip started>>rate now controlled -07/07/22--transitioned to po amio -Continue metoprolol -Continue apixaban -07/08/22--remains rate controlled on po amiodarone   Acute HFmrEF -remains clinically fluid overloaded -continue lasix IV>>increase to bid -4/3 echo EF 45-50%, mildly enlarged RV   AKI -Secondary to sepsis and hypotension -Baseline creatinine 0.8-1.0 -Presented with serum creatinine 1.40 -improved   E. coli bacteremia -Discontinue vancomycin and cefepime -Continue ceftriaxone   E. coli UTI -UA 11-20 WBC -Continue ceftriaxone   Acute metabolic Encephalopathy -due to infectious process -improving with treating infection   Uncontrolled diabetes mellitus type 2 with hyperglycemia -04/26/2022 hemoglobin A1c 7.8 -NovoLog sliding scale   Left breast cancer -Patient follows Dr. Ellin SabaKatragadda -stage IIIb poorly differentiated left breast cancer, status post left modified radical mastectomy, chemotherapy. -Resume anastrozole   Hypertension -initially on diltiazem drip>>amio due to soft BPs -Continue metoprolol   Hypokalemia/hypomagnesemia -repleting  Family Communication:  niece updated 4/6   Consultants:   none   Code Status:  FULL    DVT Prophylaxis: apixaban     Procedures: As Listed in Progress Note Above   Antibiotics: Cefepime 4/1>>4/3 Vanc 4/1 Ceftriaxone 4/3>>                   Subjective: Patient denies fevers, chills, headache, chest pain, dyspnea, nausea, vomiting, diarrhea, abdominal pain, dysuria, hematuria, hematochezia, and melena.   Objective: Vitals:   07/08/22 1100 07/08/22 1212 07/08/22 1400 07/08/22 1442  BP: (!) 117/53  111/77   Pulse: 80  93 83  Resp: 13  17 17   Temp:  97.8 F (36.6 C)    TempSrc:  Axillary    SpO2: 98%   98%  Weight:      Height:        Intake/Output Summary (Last 24 hours) at 07/08/2022 1511 Last data filed at 07/08/2022 1247 Gross per 24 hour  Intake 449.97 ml  Output 2250 ml  Net -1800.03 ml   Weight change: -2.6 kg Exam:  General:  Pt is alert, follows commands appropriately, not in acute distress HEENT: No icterus, No thrush, No neck mass, [place]/AT Cardiovascular: IRRR, S1/S2, no rubs, no gallops Respiratory: bibasilar rales. No wheeze Abdomen: Soft/+BS, non tender, non distended, no guarding Extremities: trace LE edema, No lymphangitis, No petechiae, No rashes, no synovitis   Data Reviewed: I have personally reviewed following labs and imaging studies Basic Metabolic Panel: Recent Labs  Lab 07/03/22 1410 07/04/22 0320 07/05/22 0508 07/06/22 0546 07/07/22 0454 07/08/22 0449  NA 131* 133* 135 133* 132* 133*  K 3.1* 4.3 3.8 3.5 3.1* 2.9*  CL 93* 104 106 100 98 95*  CO2 24 21* 19* 24 23 28   GLUCOSE 193* 205* 133* 113* 113* 127*  BUN 35* 30* 24* 20 23 22   CREATININE 1.40* 1.29* 1.03* 0.93 0.95 0.96  CALCIUM 8.9 7.8* 8.4* 8.7* 8.3* 8.5*  MG 1.1* 2.1  --  1.4* 1.8 1.5*  PHOS  --   --   --  3.4  --   --    Liver Function Tests: Recent Labs  Lab 07/03/22 1410 07/05/22 0508 07/06/22 0546  AST 45* 32 26  ALT 22 16 16   ALKPHOS 51 59 62  BILITOT 0.9 1.3* 0.7  PROT 8.1 7.3 6.9  ALBUMIN 2.9* 2.4*  2.1*   No results for input(s): "LIPASE", "AMYLASE" in the last 168 hours. No results for input(s): "AMMONIA" in the last 168 hours. Coagulation Profile: No results for input(s): "INR", "PROTIME" in the last 168 hours. CBC: Recent Labs  Lab 07/03/22 1410 07/04/22 0320 07/05/22 0508 07/06/22 0546  WBC 17.9* 11.4* 9.8 11.1*  NEUTROABS 13.6*  --  7.6 7.8*  HGB 10.7* 9.2* 10.1* 9.9*  HCT 31.2* 27.5* 30.2* 28.7*  MCV 90.4 92.3 92.1 89.1  PLT 156 144* 148* 167   Cardiac Enzymes: No results for input(s): "CKTOTAL", "CKMB", "CKMBINDEX", "TROPONINI" in the last 168 hours. BNP: Invalid input(s): "POCBNP" CBG: Recent Labs  Lab 07/07/22 1148 07/07/22 1620 07/07/22 2021 07/08/22 0736 07/08/22 1202  GLUCAP 161* 126* 171* 114* 162*   HbA1C: No results for input(s): "HGBA1C" in the last 72 hours. Urine analysis:    Component Value Date/Time   COLORURINE YELLOW 07/03/2022 2057   APPEARANCEUR HAZY (A) 07/03/2022 2057   APPEARANCEUR Turbid (A) 04/26/2022 1413   LABSPEC 1.016 07/03/2022 2057   PHURINE 5.0 07/03/2022 2057   GLUCOSEU NEGATIVE 07/03/2022 2057  HGBUR MODERATE (A) 07/03/2022 2057   BILIRUBINUR NEGATIVE 07/03/2022 2057   BILIRUBINUR CANCELED 04/26/2022 1413   KETONESUR NEGATIVE 07/03/2022 2057   PROTEINUR 30 (A) 07/03/2022 2057   UROBILINOGEN 0.2 11/14/2021 1443   NITRITE POSITIVE (A) 07/03/2022 2057   LEUKOCYTESUR SMALL (A) 07/03/2022 2057   Sepsis Labs: @LABRCNTIP (procalcitonin:4,lacticidven:4) ) Recent Results (from the past 240 hour(s))  Resp panel by RT-PCR (RSV, Flu A&B, Covid) Anterior Nasal Swab     Status: None   Collection Time: 07/03/22  6:56 PM   Specimen: Anterior Nasal Swab  Result Value Ref Range Status   SARS Coronavirus 2 by RT PCR NEGATIVE NEGATIVE Final    Comment: (NOTE) SARS-CoV-2 target nucleic acids are NOT DETECTED.  The SARS-CoV-2 RNA is generally detectable in upper respiratory specimens during the acute phase of infection. The  lowest concentration of SARS-CoV-2 viral copies this assay can detect is 138 copies/mL. A negative result does not preclude SARS-Cov-2 infection and should not be used as the sole basis for treatment or other patient management decisions. A negative result may occur with  improper specimen collection/handling, submission of specimen other than nasopharyngeal swab, presence of viral mutation(s) within the areas targeted by this assay, and inadequate number of viral copies(<138 copies/mL). A negative result must be combined with clinical observations, patient history, and epidemiological information. The expected result is Negative.  Fact Sheet for Patients:  BloggerCourse.com  Fact Sheet for Healthcare Providers:  SeriousBroker.it  This test is no t yet approved or cleared by the [place] FDA and  has been authorized for detection and/or diagnosis of SARS-CoV-2 by FDA under an Emergency Use Authorization (EUA). This EUA will remain  in effect (meaning this test can be used) for the duration of the COVID-19 declaration under Section 564(b)(1) of the Act, 21 U.S.C.section 360bbb-3(b)(1), unless the authorization is terminated  or revoked sooner.       Influenza A by PCR NEGATIVE NEGATIVE Final   Influenza B by PCR NEGATIVE NEGATIVE Final    Comment: (NOTE) The Xpert Xpress SARS-CoV-2/FLU/RSV plus assay is intended as an aid in the diagnosis of influenza from Nasopharyngeal swab specimens and should not be used as a sole basis for treatment. Nasal washings and aspirates are unacceptable for Xpert Xpress SARS-CoV-2/FLU/RSV testing.  Fact Sheet for Patients: BloggerCourse.com  Fact Sheet for Healthcare Providers: SeriousBroker.it  This test is not yet approved or cleared by the [place] FDA and has been authorized for detection and/or diagnosis of SARS-CoV-2 by FDA under  an Emergency Use Authorization (EUA). This EUA will remain in effect (meaning this test can be used) for the duration of the COVID-19 declaration under Section 564(b)(1) of the Act, 21 U.S.C. section 360bbb-3(b)(1), unless the authorization is terminated or revoked.     Resp Syncytial Virus by PCR NEGATIVE NEGATIVE Final    Comment: (NOTE) Fact Sheet for Patients: BloggerCourse.com  Fact Sheet for Healthcare Providers: SeriousBroker.it  This test is not yet approved or cleared by the [place] FDA and has been authorized for detection and/or diagnosis of SARS-CoV-2 by FDA under an Emergency Use Authorization (EUA). This EUA will remain in effect (meaning this test can be used) for the duration of the COVID-19 declaration under Section 564(b)(1) of the Act, 21 U.S.C. section 360bbb-3(b)(1), unless the authorization is terminated or revoked.  Performed at [facility], [address], [place], [place] [address]   Culture, blood (Routine X 2) w Reflex to ID Panel     Status: Abnormal  Collection Time: 07/03/22  9:11 PM   Specimen: BLOOD RIGHT HAND  Result Value Ref Range Status   Specimen Description   Final    BLOOD RIGHT HAND Performed at [facility], [address] [place], [place] [address]    Special Requests   Final    BOTTLES DRAWN AEROBIC AND ANAEROBIC Blood Culture adequate volume Performed at [facility], [address] [place], [place] [address]    Culture  Setup Time   Final    GRAM NEGATIVE RODS Gram Stain Report Called to,Read Back By and Verified With: SHELTON, A. @ [phone] 07/04/2022 BY FRATTO, A. IN BOTH AEROBIC AND ANAEROBIC BOTTLES CRITICAL RESULT CALLED TO, READ BACK BY AND VERIFIED WITH: PHARMD Drusilla Kanner [phone] [id number] BY JRS Performed at [facility], [address] [place], [place] [address]    Culture ESCHERICHIA COLI (A)  Final   Report Status 07/06/2022 FINAL  Final   Organism  ID, Bacteria ESCHERICHIA COLI  Final      Susceptibility   Escherichia coli - MIC*    AMPICILLIN 4 SENSITIVE Sensitive     CEFEPIME <=0.12 SENSITIVE Sensitive     CEFTAZIDIME <=1 SENSITIVE Sensitive     CEFTRIAXONE <=0.25 SENSITIVE Sensitive     CIPROFLOXACIN <=0.25 SENSITIVE Sensitive     GENTAMICIN <=1 SENSITIVE Sensitive     IMIPENEM <=0.25 SENSITIVE Sensitive     TRIMETH/SULFA <=20 SENSITIVE Sensitive     AMPICILLIN/SULBACTAM <=2 SENSITIVE Sensitive     PIP/TAZO <=4 SENSITIVE Sensitive     * ESCHERICHIA COLI  Blood Culture ID Panel (Reflexed)     Status: Abnormal   Collection Time: 07/03/22  9:11 PM  Result Value Ref Range Status   Enterococcus faecalis NOT DETECTED NOT DETECTED Final   Enterococcus Faecium NOT DETECTED NOT DETECTED Final   Listeria monocytogenes NOT DETECTED NOT DETECTED Final   Staphylococcus species NOT DETECTED NOT DETECTED Final   Staphylococcus aureus (BCID) NOT DETECTED NOT DETECTED Final   Staphylococcus epidermidis NOT DETECTED NOT DETECTED Final   Staphylococcus lugdunensis NOT DETECTED NOT DETECTED Final   Streptococcus species NOT DETECTED NOT DETECTED Final   Streptococcus agalactiae NOT DETECTED NOT DETECTED Final   Streptococcus pneumoniae NOT DETECTED NOT DETECTED Final   Streptococcus pyogenes NOT DETECTED NOT DETECTED Final   A.calcoaceticus-baumannii NOT DETECTED NOT DETECTED Final   Bacteroides fragilis NOT DETECTED NOT DETECTED Final   Enterobacterales DETECTED (A) NOT DETECTED Final    Comment: Enterobacterales represent a large order of gram negative bacteria, not a single organism. CRITICAL RESULT CALLED TO, READ BACK BY AND VERIFIED WITH: PHARMD DEVAN MITCHELL [phone] [id number] BY JRS    Enterobacter cloacae complex NOT DETECTED NOT DETECTED Final   Escherichia coli DETECTED (A) NOT DETECTED Final    Comment: CRITICAL RESULT CALLED TO, READ BACK BY AND VERIFIED WITH: PHARMD DEVAN MITCHELL [phone] 1542 BY JRS    Klebsiella aerogenes  NOT DETECTED NOT DETECTED Final   Klebsiella oxytoca NOT DETECTED NOT DETECTED Final   Klebsiella pneumoniae NOT DETECTED NOT DETECTED Final   Proteus species NOT DETECTED NOT DETECTED Final   Salmonella species NOT DETECTED NOT DETECTED Final   Serratia marcescens NOT DETECTED NOT DETECTED Final   Haemophilus influenzae NOT DETECTED NOT DETECTED Final   Neisseria meningitidis NOT DETECTED NOT DETECTED Final   Pseudomonas aeruginosa NOT DETECTED NOT DETECTED Final   Stenotrophomonas maltophilia NOT DETECTED NOT DETECTED Final   Candida albicans NOT DETECTED NOT DETECTED Final   Candida  auris NOT DETECTED NOT DETECTED Final   Candida glabrata NOT DETECTED NOT DETECTED Final   Candida krusei NOT DETECTED NOT DETECTED Final   Candida parapsilosis NOT DETECTED NOT DETECTED Final   Candida tropicalis NOT DETECTED NOT DETECTED Final   Cryptococcus neoformans/gattii NOT DETECTED NOT DETECTED Final   CTX-M ESBL NOT DETECTED NOT DETECTED Final   Carbapenem resistance IMP NOT DETECTED NOT DETECTED Final   Carbapenem resistance KPC NOT DETECTED NOT DETECTED Final   Carbapenem resistance NDM NOT DETECTED NOT DETECTED Final   Carbapenem resist OXA 48 LIKE NOT DETECTED NOT DETECTED Final   Carbapenem resistance VIM NOT DETECTED NOT DETECTED Final    Comment: Performed at [facility], [address] [place], [place] [address]  Culture, blood (Routine X 2) w Reflex to ID Panel     Status: Abnormal   Collection Time: 07/03/22  9:13 PM   Specimen: BLOOD LEFT ARM  Result Value Ref Range Status   Specimen Description   Final    BLOOD LEFT ARM Performed at [facility], [address] [place], [place] [address]    Special Requests   Final    BOTTLES DRAWN AEROBIC AND ANAEROBIC Blood Culture adequate volume Performed at [facility], [address] [place], [place] [address]    Culture  Setup Time   Final    GRAM NEGATIVE RODS Gram Stain Report Called to,Read Back By and Verified With:  SHELTON, A. @[phone]  07/04/2022 BY FRATTO, A. IN BOTH AEROBIC AND ANAEROBIC BOTTLES CRITICAL VALUE NOTED.  VALUE IS CONSISTENT WITH PREVIOUSLY REPORTED AND CALLED VALUE.    Culture (A)  Final    ESCHERICHIA COLI SUSCEPTIBILITIES PERFORMED ON PREVIOUS CULTURE WITHIN THE LAST 5 DAYS. Performed at [facility], [address], [place], [place] [address]    Report Status 07/06/2022 FINAL  Final  MRSA Next Gen by PCR, Nasal     Status: None   Collection Time: 07/03/22  9:18 PM   Specimen: Nasal Mucosa; Nasal Swab  Result Value Ref Range Status   MRSA by PCR Next Gen NOT DETECTED NOT DETECTED Final    Comment: (NOTE) The GeneXpert MRSA Assay (FDA approved for NASAL specimens only), is one component of a comprehensive MRSA colonization surveillance program. It is not intended to diagnose MRSA infection nor to guide or monitor treatment for MRSA infections. Test performance is not FDA approved in patients less than 44 years old. Performed at [facility], [address], [place], [place] [address]   Urine Culture     Status: Abnormal   Collection Time: 07/03/22  9:55 PM   Specimen: Urine, Clean Catch  Result Value Ref Range Status   Specimen Description   Final    URINE, CLEAN CATCH Performed at [facility], [address], [place], [place] [address]    Special Requests   Final    NONE Performed at [facility], [address], [place], [place] [address]    Culture >=100,000 COLONIES/mL ESCHERICHIA COLI (A)  Final   Report Status 07/06/2022 FINAL  Final   Organism ID, Bacteria ESCHERICHIA COLI (A)  Final      Susceptibility   Escherichia coli - MIC*    AMPICILLIN 4 SENSITIVE Sensitive     CEFAZOLIN <=4 SENSITIVE Sensitive     CEFEPIME <=0.12 SENSITIVE Sensitive     CEFTRIAXONE <=0.25 SENSITIVE Sensitive     CIPROFLOXACIN <=0.25 SENSITIVE Sensitive     GENTAMICIN <=1 SENSITIVE Sensitive  IMIPENEM <=0.25 SENSITIVE Sensitive     NITROFURANTOIN <=16 SENSITIVE Sensitive      TRIMETH/SULFA <=20 SENSITIVE Sensitive     AMPICILLIN/SULBACTAM <=2 SENSITIVE Sensitive     PIP/TAZO <=4 SENSITIVE Sensitive     * >=100,000 COLONIES/mL ESCHERICHIA COLI     Scheduled Meds:  amiodarone  200 mg Oral BID   anastrozole  1 mg Oral Daily   apixaban  5 mg Oral BID   Chlorhexidine Gluconate Cloth  6 each Topical Daily   furosemide  40 mg Intravenous BID   insulin aspart  0-5 Units Subcutaneous QHS   insulin aspart  0-9 Units Subcutaneous TID WC   metoprolol tartrate  50 mg Oral BID   mouth rinse  15 mL Mouth Rinse 4 times per day   Continuous Infusions:  cefTRIAXone (ROCEPHIN)  IV Stopped (07/08/22 0854)    Procedures/Studies: DG Pelvis 1-2 Views  Result Date: 07/06/2022 CLINICAL DATA:  Pain EXAM: PELVIS - 1 VIEW COMPARISON:  None Available. FINDINGS: There is no evidence of pelvic fracture or diastasis. No pelvic bone lesions are seen. IMPRESSION: Negative. Electronically Signed   By: Layla Maw M.D.   On: 07/06/2022 10:27   DG FEMUR PORT, MIN 2 VIEWS RIGHT  Result Date: 07/06/2022 CLINICAL DATA:  Fall EXAM: RIGHT FEMUR PORTABLE 2 VIEW COMPARISON:  None Available. FINDINGS: There is no evidence of fracture or other focal bone lesions. Soft tissues are unremarkable. IMPRESSION: Negative. Electronically Signed   By: Layla Maw M.D.   On: 07/06/2022 10:17   [place] EKG SITE RITE  Result Date: 07/05/2022 If Site Rite image not attached, placement could not be confirmed due to current cardiac rhythm.  ECHOCARDIOGRAM COMPLETE  Result Date: 07/05/2022    ECHOCARDIOGRAM REPORT   Patient Name:   ABIHA LUKEHART Date of Exam: 07/05/2022 Medical Rec #:  [id number]    Height:       64.0 in Accession #:    [id number]   Weight:       147.9 lb Date of Birth:  [birth year]   BSA:          1.721 m Patient Age:    66 years     BP:           124/53 mmHg Patient Gender: F            HR:           117 bpm. [facility] Procedure: 2D Echo, Cardiac Doppler and Color Doppler  Indications:    CHF-Acute Diastolic I50.31  History:        Patient has prior history of Echocardiogram examinations, most                 recent 01/13/2021. Sepsis, Arrythmias:Atrial Fibrillation,                 Signs/Symptoms:Chest Pain; Risk Factors:Hypertension and                 Diabetes.  Sonographer:    Aron Baba Referring Phys: [phone] Edris Friedt  Sonographer Comments: Image acquisition challenging due to patient body habitus and Image acquisition challenging due to respiratory motion. IMPRESSIONS  1. Challenging study for LVEF assessment due to tachycardia. Left ventricular ejection fraction, by estimation, is 45 to 50%. The left ventricle has mildly decreased function. Left ventricular endocardial border not optimally defined to evaluate regional wall motion. Left ventricular diastolic function could not be evaluated due to atrial fibrillation.  2. Right  ventricular systolic function was not well visualized. The right ventricular size is mildly enlarged.  3. Left atrial size was moderately dilated.  4. The mitral valve is grossly normal. Trivial mitral valve regurgitation. No evidence of mitral stenosis.  5. The aortic valve is tricuspid. There is mild calcification of the aortic valve. Aortic valve regurgitation is not visualized. No aortic stenosis is present. Comparison(s): Changes from prior study are noted. LVEF worsened from 55% in 2020 to 45-50% now. FINDINGS  Left Ventricle: Left ventricular ejection fraction, by estimation, is 45 to 50%. The left ventricle has mildly decreased function. Left ventricular endocardial border not optimally defined to evaluate regional wall motion. The left ventricular internal cavity size was normal in size. There is no left ventricular hypertrophy. Left ventricular diastolic function could not be evaluated due to atrial fibrillation. Left ventricular diastolic function could not be evaluated. Right Ventricle: The right ventricular size is mildly enlarged. No  increase in right ventricular wall thickness. Right ventricular systolic function was not well visualized. Left Atrium: Left atrial size was moderately dilated. Right Atrium: Right atrial size was normal in size. Pericardium: There is no evidence of pericardial effusion. Mitral Valve: The mitral valve is grossly normal. Trivial mitral valve regurgitation. No evidence of mitral valve stenosis. Tricuspid Valve: The tricuspid valve is grossly normal. Tricuspid valve regurgitation is mild . No evidence of tricuspid stenosis. Aortic Valve: The aortic valve is tricuspid. There is mild calcification of the aortic valve. Aortic valve regurgitation is not visualized. No aortic stenosis is present. Pulmonic Valve: The pulmonic valve was not well visualized. Pulmonic valve regurgitation is not visualized. No evidence of pulmonic stenosis. Aorta: The aortic root and ascending aorta are structurally normal, with no evidence of dilitation. Venous: The inferior vena cava was not well visualized. IAS/Shunts: The interatrial septum was not well visualized.  LEFT VENTRICLE PLAX 2D LVIDd:         4.20 cm   Diastology LVIDs:         3.20 cm   LV e' medial:    9.25 cm/s LV PW:         1.10 cm   LV E/e' medial:  14.6 LV IVS:        0.70 cm   LV e' lateral:   10.10 cm/s LVOT diam:     1.80 cm   LV E/e' lateral: 13.4 LV SV:         43 LV SV Index:   25 LVOT Area:     2.54 cm  RIGHT VENTRICLE RV S prime:     11.30 cm/s TAPSE (M-mode): 1.7 cm LEFT ATRIUM           Index        RIGHT ATRIUM           Index LA diam:      3.30 cm 1.92 cm/m   RA Area:     16.70 cm LA Vol (A2C): 25.5 ml 14.82 ml/m  RA Volume:   46.90 ml  27.25 ml/m LA Vol (A4C): 76.6 ml 44.51 ml/m  AORTIC VALVE LVOT Vmax:   82.50 cm/s LVOT Vmean:  53.300 cm/s LVOT VTI:    0.170 m  AORTA Ao Root diam: 2.80 cm Ao Asc diam:  3.10 cm MITRAL VALVE                TRICUSPID VALVE MV Area (PHT): 5.54 cm     TR Peak grad:   27.2 mmHg MV Decel Time: 137  msec     TR Vmax:         261.00 cm/s MR Peak grad: 53.4 mmHg MR Vmax:      365.50 cm/s   SHUNTS MV E velocity: 135.00 cm/s  Systemic VTI:  0.17 m                             Systemic Diam: 1.80 cm Vishnu Priya Mallipeddi Electronically signed by Winfield Rast Mallipeddi Signature Date/Time: 07/05/2022/3:45:55 PM    Final    DG CHEST PORT 1 VIEW  Result Date: 07/05/2022 CLINICAL DATA:  Shortness of breath EXAM: PORTABLE CHEST 1 VIEW COMPARISON:  Yesterday FINDINGS: Cardiomegaly. Interstitial opacity with streaky density in the lower lungs. Small right pleural effusion. No pneumothorax. Porta catheter with tip at the SVC. IMPRESSION: Interstitial and airspace opacity at the bases that could be infection and/or edema. Aeration is worsened from yesterday. Electronically Signed   By: Tiburcio Pea M.D.   On: 07/05/2022 07:56   MM 3D SCREEN BREAST UNI RIGHT  Result Date: 07/04/2022 CLINICAL DATA:  Screening. History left mastectomy 2019. EXAM: DIGITAL SCREENING UNILATERAL RIGHT MAMMOGRAM WITH CAD AND TOMOSYNTHESIS TECHNIQUE: Right screening digital craniocaudal and mediolateral oblique mammograms were obtained. Right screening digital breast tomosynthesis was performed. The images were evaluated with computer-aided detection. COMPARISON:  Previous exam(s). ACR Breast Density Category b: There are scattered areas of fibroglandular density. FINDINGS: There are no findings suspicious for malignancy. Note that a request was initially made to repeat the right CC to obtain more posterior tissue however the patient is currently hospitalized and located in the intensive care unit and therefore would not be able to obtain any additional mammography any time soon. IMPRESSION: No mammographic evidence of malignancy. A result letter of this screening mammogram will be mailed directly to the patient. RECOMMENDATION: Screening mammogram in one year. (Code:[id number]) BI-RADS CATEGORY  1: Negative. Electronically Signed   By: Edwin Cap M.D.   On:  07/04/2022 15:57   DG Chest Port 1 View  Result Date: 07/04/2022 CLINICAL DATA:  67 year old female with history of dyspnea. EXAM: PORTABLE CHEST 1 VIEW COMPARISON:  Chest x-ray 07/03/2022. FINDINGS: Right subclavian single-lumen power porta cath with tip terminating in the distal superior vena cava. Areas of interstitial prominence and peribronchial cuffing, most evident in the mid to lower lungs bilaterally. Focal opacity in the retrocardiac region on the left likely reflects atelectasis, although developing airspace consolidation is not excluded. No pleural effusions. No pneumothorax. No evidence of pulmonary edema. Heart size is normal. Upper mediastinal contours are within normal limits. IMPRESSION: 1. The appearance of the chest may suggest an acute bronchitis. 2. There is also a retrocardiac opacity in the medial left base which may reflect an area of atelectasis or developing airspace consolidation. Followup PA and lateral chest X-ray is recommended in 3-4 weeks following trial of antibiotic therapy to ensure resolution and exclude underlying malignancy. Electronically Signed   By: Trudie Reed M.D.   On: 07/04/2022 05:45   CT Head Wo Contrast  Result Date: 07/03/2022 CLINICAL DATA:  Nausea and vomiting for 3 days, altered level of consciousness EXAM: CT HEAD WITHOUT CONTRAST TECHNIQUE: Contiguous axial images were obtained from the base of the skull through the vertex without intravenous contrast. RADIATION DOSE REDUCTION: This exam was performed according to the departmental dose-optimization program which includes automated exposure control, adjustment of the mA and/or kV according to patient size and/or use of iterative reconstruction  technique. COMPARISON:  None Available. FINDINGS: Brain: Hypodensity in the left occipital periventricular and subcortical white matter consistent with age indeterminate ischemic change. No other signs of acute infarct or hemorrhage. Lateral ventricles and midline  structures are unremarkable. There are no acute extra-axial fluid collections. No mass effect. Vascular: Atherosclerosis of the internal carotid arteries. No hyperdense vessel. Skull: Normal. Negative for fracture or focal lesion. Sinuses/Orbits: Prominent mucoperiosteal thickening of the bilateral maxillary sinuses, with a small superimposed gas fluid level on the left. Minimal retained secretions within the sphenoid sinus. Other: None. IMPRESSION: 1. Age indeterminate ischemic changes within the left occipital periventricular and subcortical white matter, favor chronic. 2. Otherwise no acute intracranial process. 3. Bilateral maxillary sinus disease, with evidence of superimposed acute left maxillary sinusitis. Electronically Signed   By: Sharlet Salina M.D.   On: 07/03/2022 19:43   DG Chest 2 View  Result Date: 07/03/2022 CLINICAL DATA:  AFib EXAM: CHEST - 2 VIEW COMPARISON:  CXR 06/27/17 FINDINGS: Right-sided chest port in place with the tip in the upper SVC, unchanged from prior exam. No pleural effusion. No pneumothorax. No focal airspace opacity. Normal cardiac and mediastinal contours. No radiographically apparent displaced rib fractures. Visualized upper abdomen is unremarkable. Vertebral body heights are maintained. IMPRESSION: No focal airspace opacity. Electronically Signed   By: Lorenza Cambridge M.D.   On: 07/03/2022 14:44   DG Bone Density  Result Date: 06/26/2022 EXAM: DUAL X-RAY ABSORPTIOMETRY (DXA) FOR BONE MINERAL DENSITY IMPRESSION: Your patient Desirea Mizrahi completed a BMD test on 06/26/2022 using the [employer] DXA System (software version: 14.10) manufactured by [employer]. The following summarizes the results of our evaluation. Technologist: [facility] PATIENT BIOGRAPHICAL: Name: Imanni, Burdine Patient ID: [id number] Birth Date: 1955/04/23 Height: 64.0 in. Gender: Female Exam Date: 06/26/2022 Weight: 151.0 lbs. Indications: Hx Breast Ca, Caucasian, Secondary Osteoporosis, Follow up  Osteopenia, Post Menopausal Fractures: Treatments: Calcium, Anastrozole, Vitamin D DENSITOMETRY RESULTS: Site         Region     Measured Date Measured Age WHO Classification Young Adult T-score BMD         %Change vs. Previous Significant Change (*) DualFemur Neck Left 06/26/2020 68.3 Osteopenia -1.9 0.773 g/cm2 -5.3% Yes DualFemur Neck Left 06/18/2020 66.2 Osteopenia -1.6 0.816 g/cm2 2.6% - DualFemur Neck Left 03/07/2018 64.0 Osteopenia -1.8 0.795 g/cm2 - - DualFemur Total Mean 06/26/2022 68.3 Osteopenia -1.3 0.838 g/cm2 0.0% - DualFemur Total Mean 06/18/2020 66.2 Osteopenia -1.3 0.838 g/cm2 -2.9% Yes DualFemur Total Mean 03/07/2018 64.0 - - 0.863 g/cm2 - - Left Forearm Radius 33% 06/26/2022 68.3 Osteopenia -1.4 0.616 g/cm2 -7.8% Yes Left Forearm Radius 33% 06/18/2020 66.2 Normal -0.6 0.668 g/cm2 -9.6% Yes Left Forearm Radius 33% 03/07/2018 64.0 Normal 0.4 0.739 g/cm2 - - ASSESSMENT: The BMD measured at Femur Neck Left is 0.773 g/cm2 with a T-score of -1.9. This patient is considered osteopenic according to World Health Organization [facility]) criteria. The scan quality is good. Compared with the prior study on 06/18/20, the BMD of the total mean shows no statistically significant change, however, the lt. forearm shows a significant decrease. Lumbar spine was excluded due to advanced degenerative changes. World Science writer [facility]) criteria for post-menopausal, Caucasian Women: Normal:       T-score at or above -1 SD Osteopenia:   T-score between -1 and -2.5 SD Osteoporosis: T-score at or below -2.5 SD RECOMMENDATIONS: 1. All patients should optimize calcium and vitamin D intake. 2. Consider FDA-approved medical therapies in postmenopausal women and med aged  50 years and older, based on the following: a. A hip or vertebral (clinical or morphometric) fracture b. T-score< -2.5 at the femoral neck or spine after appropriate evaluation to exclude secondary causes c. Low bone mass (T-score between -1.0 and -2.5 at the  femoral neck or spine) and a 10-year probability of a hip fracture > 3% or a 10-year probability of a major osteoporosis-related fracture > 20% based on the [place]-adapted WHO algorithm d. Clinician judgment and/or patient preferences may indicate treatment for people with 10-year fracture probabilities above or below these levels FOLLOW-UP: Patients with diagnosis of osteoporsis or at high risk for fracture should have regular bone mineral density tests. For patients eligible for Medicare, routine testing is allowed once every 2 years. The testing frequency can be increased to one year for patients who have rapidly progressing disease, those who are receiving or discontinuing medical therapy to restore bone mass, or have additional risk factors. I have reviewed this report, and agree with the above findings. [facility] Your patient MERILYNN HAYDU completed a FRAX assessment on 06/26/2022 using the [employer] DXA System (analysis version: 14.10) manufactured by [employer]. The following summarizes the results of our evaluation. PATIENT BIOGRAPHICAL: Name: Prabhjot, Maddux Patient ID: [id number] Birth Date: 17-Apr-1955 Height:    64.0 in. [age]       Weight:    151.0 lbs. Ethnicity:  White                            Exam Date: 06/26/2022 FRAX* RESULTS:  (version: 3.5) 10-year Probability of Fracture1 Major Osteoporotic Fracture2 Hip Fracture 11.3% 1.9% Population: [place] (Caucasian) Risk Factors: Secondary Osteoporosis Based on DualFemur (Left) Neck BMD 1 -The 10-year probability of fracture may be lower than reported if the patient has received treatment. 2 -Major Osteoporotic Fracture: Clinical Spine, Forearm, Hip or Shoulder *FRAX is a Armed forces logistics/support/administrative officer of the [employer] of [employer] for Metabolic Bone Disease, a World Science writer (WHO) [employer]. ASSESSMENT: The probability of a major osteoporotic fracture is 11.3% within the next ten  years. The probability of a hip fracture is 1.9% within the next ten years. Electronically Signed   By: Gerome Sam III M.D.   On: 06/26/2022 09:39    Catarina Hartshorn, DO  [facility]  If 7PM-7AM, please contact night-coverage [email or web address] Password [email or web address] 07/08/2022, 3:11 PM   LOS: 5 days

## 2022-07-09 DIAGNOSIS — G9341 Metabolic encephalopathy: Secondary | ICD-10-CM | POA: Diagnosis not present

## 2022-07-09 DIAGNOSIS — R7881 Bacteremia: Secondary | ICD-10-CM | POA: Diagnosis not present

## 2022-07-09 DIAGNOSIS — I4891 Unspecified atrial fibrillation: Secondary | ICD-10-CM | POA: Diagnosis not present

## 2022-07-09 DIAGNOSIS — N179 Acute kidney failure, unspecified: Secondary | ICD-10-CM | POA: Diagnosis not present

## 2022-07-09 LAB — GLUCOSE, CAPILLARY
Glucose-Capillary: 116 mg/dL — ABNORMAL HIGH (ref 70–99)
Glucose-Capillary: 181 mg/dL — ABNORMAL HIGH (ref 70–99)
Glucose-Capillary: 107 mg/dL — ABNORMAL HIGH (ref 70–99)
Glucose-Capillary: 133 mg/dL — ABNORMAL HIGH (ref 70–99)

## 2022-07-09 LAB — BASIC METABOLIC PANEL
Anion gap: 10 (ref 5–15)
BUN: 25 mg/dL — ABNORMAL HIGH (ref 8–23)
CO2: 26 mmol/L (ref 22–32)
Calcium: 8.7 mg/dL — ABNORMAL LOW (ref 8.9–10.3)
Chloride: 98 mmol/L (ref 98–111)
Creatinine, Ser: 0.91 mg/dL (ref 0.44–1.00)
GFR, Estimated: >60 mL/min (ref >60)
Glucose, Bld: 172 mg/dL — ABNORMAL HIGH (ref 70–99)
Potassium: 3.7 mmol/L (ref 3.5–5.1)
Sodium: 134 mmol/L — ABNORMAL LOW (ref 135–145)

## 2022-07-09 LAB — MAGNESIUM: Magnesium: 1.6 mg/dL — ABNORMAL LOW (ref 1.7–2.4)

## 2022-07-09 MED ORDER — FUROSEMIDE 40 MG PO TABS
40.0000 mg | ORAL_TABLET | Freq: Every day | ORAL | Status: DC
  Administered 2022-07-10: 40 mg via ORAL
  Filled 2022-07-09: qty 1

## 2022-07-09 MED ORDER — MAGNESIUM OXIDE -MG SUPPLEMENT 400 (240 MG) MG PO TABS
400.0000 mg | ORAL_TABLET | Freq: Two times a day (BID) | ORAL | Status: DC
  Administered 2022-07-10: 400 mg via ORAL
  Filled 2022-07-09: qty 1

## 2022-07-09 MED ORDER — MAGNESIUM SULFATE 2 GM/50ML IV SOLN
2.0000 g | Freq: Once | INTRAVENOUS | Status: AC
  Administered 2022-07-09: 2 g via INTRAVENOUS
  Filled 2022-07-09: qty 50

## 2022-07-09 NOTE — TOC Progression Note (Signed)
Transition of Care [facility]) - Progression Note    Patient Details  Name: Lisa Thornton MRN: [id number] Date of Birth: 01/11/1956  Transition of Care [facility]) CM/SW Contact  Elliot Gault, LCSW Phone Number: 07/09/2022, 11:47 AM  Clinical Narrative:     TOC following. Updated Jill Side at [facility] on pt acceptance of bed offer. MD anticipating dc Monday. Jill Side states that they can take pt Monday if she has [place].  TOC added PAC facility choice to auth request yesterday. Berkley Harvey is still pending.   Assigned TOC will follow up in AM.  Expected Discharge Plan: Skilled Nursing Facility Barriers to Discharge: Continued Medical Work up  Expected Discharge Plan and Services     Post Acute Care Choice: Skilled Nursing Facility                                         Social Determinants of Health (SDOH) Interventions SDOH Screenings   Food Insecurity: No Food Insecurity (07/03/2022)  Housing: Low Risk  (07/03/2022)  Transportation Needs: No Transportation Needs (07/03/2022)  Utilities: Not At Risk (07/03/2022)  Alcohol Screen: Low Risk  (03/25/2020)  Depression (PHQ2-9): Low Risk  (06/28/2022)  Financial Resource Strain: Low Risk  (03/29/2021)  Physical Activity: Insufficiently Active (03/29/2021)  Social Connections: Socially Isolated (03/29/2021)  Stress: No Stress Concern Present (03/29/2021)  Tobacco Use: Low Risk  (07/03/2022)    Readmission Risk Interventions     No data to display

## 2022-07-09 NOTE — Progress Notes (Addendum)
PROGRESS NOTE  Lisa Thornton ZOX:[id number] DOB: Sep 23, 1955 DOA: 07/03/2022 PCP: Anabel Halon, MD  Brief History:  67 y.o. female with medical history significant for   Atria fibrillation, hypertension, diabetes mellitus, breast cancer. Patient was brought to the ED reports of nausea vomiting over the past 3 days, and generalized weakness.  Went to the urgent care and was referred to the ED. At the time of my evaluation, patient is a bit confused and denies symptoms previously reported, answers a few questions.  Patient's grand niece-[name] is at bedside and provides most of the history.  She reports that over the past 3 days, patient has had 1-2 episodes of vomiting daily.  No diarrhea.  No abdominal pain.  Blood sugars were in the 400s yesterday.  Patient denies chest pain or urinary symptoms.  It is unknown if she has been taking her medications over the past few days.  Patient's nurse reported today for in the ED, patient's speech has become a bit confused, like talking about her mother who died in 08/28/15.   ED Course: Tmax 99.9.  Heart rate up to 150s.  Respiratory rate 18- 33.  Blood pressure systolic 102 -135.  Potassium 3.1.  Magnesium 1.1.  Chest x-ray clear.  WBC 17.9.  Creatinine 1.4.  Cardizem drip started with improvement in heart rate.  1 L bolus given.  Magnesium and potassium supplementation started.  Hospitalist to admit for atrial fibrillation with RVR, possible encephalopathy.  Patient was initially started on vancomycin, cefepime, metronidazole.  Blood cultures and urine cultures grew E. coli.  Her antibiotics were de-escalated to ceftriaxone.  Chest x-ray showed right lower lobe opacity.  She was treated for pneumonia.  After fluid resuscitation, the patient became fluid overloaded.  She was started on IV Lasix.  She developed atrial fibrillation with RVR.  She was initially started on diltiazem drip.  This was transitioned to amiodarone secondary to soft blood pressure.    As her sepsis physiology resolved, her atrial fibrillation improved.   Assessment/Plan: Acute respiratory failure with hypoxia -Secondary to pulmonary edema and pneumonia -Initially on BiPAP -Wean oxygen as tolerated -now weaned to 5L>>1.5L>>now on RA   Lobar pneumonia -Personally reviewed chest x-ray--increased interstitial prominence, right lower lobe opacity, left retrocardiac opacity -PCT 69.47 -Continue ceftriaxone   Severe sepsis -Secondary to pneumonia and bacteremia -Present on admission -Meeting severe criteria with tachycardia heart rates up to 150s, ranging from 180-158, tachypnea respirate rate 18-33, leukocytosis of 17.9.  -Lactic acid peaked 2.0   Atrial fibrillation with RVR, type unspecified -d/c diltiazem drip -amiodarone drip started>>rate now controlled -07/06/21--transitioned to po amio -Continue metoprolol -Continue apixaban   Acute HFmrEF -remains clinically fluid overloaded -continue lasix IV>>increased to bid>>switch to po lasix -4/3 echo EF 45-50%, mildly enlarged RV -daily weight  150 lb>>144.4 lb -ReDS vest 24>>18 (07/09/22)   AKI -Secondary to sepsis and hypotension -Baseline creatinine 0.8-1.0 -Presented with serum creatinine 1.40 -improving   E. coli bacteremia -Discontinue vancomycin and cefepime -Continue ceftriaxone   E. coli UTI -UA 11-20 WBC -Continue ceftriaxone   Acute metabolic Encephalopathy -due to infectious process -improving with treating infection -now back to baaseline   Uncontrolled diabetes mellitus type 2 with hyperglycemia -04/26/2022 hemoglobin A1c 7.8 -NovoLog sliding scale   Left breast cancer -Patient follows Dr. Ellin Saba -stage IIIb poorly differentiated left breast cancer, status post left modified radical mastectomy, chemotherapy. -Resume anastrozole   Hypertension -initially on diltiazem drip>>amio due to  soft BPs -Continue metoprolol           Family Communication:  niece updated 4/6    Consultants:  none   Code Status:  FULL    DVT Prophylaxis: apixaban     Procedures: As Listed in Progress Note Above   Antibiotics: Cefepime 4/1>>4/3 Vanc 4/1 Ceftriaxone 4/3>>               Subjective: Patient denies fevers, chills, headache, chest pain, dyspnea, nausea, vomiting, diarrhea, abdominal pain, dysuria, hematuria, hematochezia, and melena.   Objective: Vitals:   07/09/22 1142 07/09/22 1200 07/09/22 1300 07/09/22 1400  BP:  (!) 102/49 (!) 107/46 (!) 119/46  Pulse: 79 80 75 79  Resp: 16 14 14 16   Temp: 98.9 F (37.2 C)     TempSrc: Oral     SpO2: 100% 94% 97% 95%  Weight:      Height:        Intake/Output Summary (Last 24 hours) at 07/09/2022 1504 Last data filed at 07/09/2022 2956 Gross per 24 hour  Intake 220.21 ml  Output 2875 ml  Net -2654.79 ml   Weight change:  Exam:  General:  Pt is alert, follows commands appropriately, not in acute distress HEENT: No icterus, No thrush, No neck mass, [place]/AT Cardiovascular: IRRR, S1/S2, no rubs, no gallops Respiratory: bibasilar rales. No wheeze Abdomen: Soft/+BS, non tender, non distended, no guarding Extremities: No edema, No lymphangitis, No petechiae, No rashes, no synovitis   Data Reviewed: I have personally reviewed following labs and imaging studies Basic Metabolic Panel: Recent Labs  Lab 07/04/22 0320 07/05/22 0508 07/06/22 0546 07/07/22 0454 07/08/22 0449 07/09/22 0411  NA 133* 135 133* 132* 133* 134*  K 4.3 3.8 3.5 3.1* 2.9* 3.7  CL 104 106 100 98 95* 98  CO2 21* 19* 24 23 28 26   GLUCOSE 205* 133* 113* 113* 127* 172*  BUN 30* 24* 20 23 22  25*  CREATININE 1.29* 1.03* 0.93 0.95 0.96 0.91  CALCIUM 7.8* 8.4* 8.7* 8.3* 8.5* 8.7*  MG 2.1  --  1.4* 1.8 1.5* 1.6*  PHOS  --   --  3.4  --   --   --    Liver Function Tests: Recent Labs  Lab 07/03/22 1410 07/05/22 0508 07/06/22 0546  AST 45* 32 26  ALT 22 16 16   ALKPHOS 51 59 62  BILITOT 0.9 1.3* 0.7  PROT 8.1 7.3 6.9   ALBUMIN 2.9* 2.4* 2.1*   No results for input(s): "LIPASE", "AMYLASE" in the last 168 hours. No results for input(s): "AMMONIA" in the last 168 hours. Coagulation Profile: No results for input(s): "INR", "PROTIME" in the last 168 hours. CBC: Recent Labs  Lab 07/03/22 1410 07/04/22 0320 07/05/22 0508 07/06/22 0546  WBC 17.9* 11.4* 9.8 11.1*  NEUTROABS 13.6*  --  7.6 7.8*  HGB 10.7* 9.2* 10.1* 9.9*  HCT 31.2* 27.5* 30.2* 28.7*  MCV 90.4 92.3 92.1 89.1  PLT 156 144* 148* 167   Cardiac Enzymes: No results for input(s): "CKTOTAL", "CKMB", "CKMBINDEX", "TROPONINI" in the last 168 hours. BNP: Invalid input(s): "POCBNP" CBG: Recent Labs  Lab 07/08/22 1202 07/08/22 1604 07/08/22 2134 07/09/22 0801 07/09/22 1143  GLUCAP 162* 146* 110* 116* 181*   HbA1C: No results for input(s): "HGBA1C" in the last 72 hours. Urine analysis:    Component Value Date/Time   COLORURINE YELLOW 07/03/2022 2057   APPEARANCEUR HAZY (A) 07/03/2022 2057   APPEARANCEUR Turbid (A) 04/26/2022 1413   LABSPEC 1.016 07/03/2022 2057  PHURINE 5.0 07/03/2022 2057   GLUCOSEU NEGATIVE 07/03/2022 2057   HGBUR MODERATE (A) 07/03/2022 2057   BILIRUBINUR NEGATIVE 07/03/2022 2057   BILIRUBINUR CANCELED 04/26/2022 1413   KETONESUR NEGATIVE 07/03/2022 2057   PROTEINUR 30 (A) 07/03/2022 2057   UROBILINOGEN 0.2 11/14/2021 1443   NITRITE POSITIVE (A) 07/03/2022 2057   LEUKOCYTESUR SMALL (A) 07/03/2022 2057   Sepsis Labs: @LABRCNTIP (procalcitonin:4,lacticidven:4) ) Recent Results (from the past 240 hour(s))  Resp panel by RT-PCR (RSV, Flu A&B, Covid) Anterior Nasal Swab     Status: None   Collection Time: 07/03/22  6:56 PM   Specimen: Anterior Nasal Swab  Result Value Ref Range Status   SARS Coronavirus 2 by RT PCR NEGATIVE NEGATIVE Final    Comment: (NOTE) SARS-CoV-2 target nucleic acids are NOT DETECTED.  The SARS-CoV-2 RNA is generally detectable in upper respiratory specimens during the acute phase of  infection. The lowest concentration of SARS-CoV-2 viral copies this assay can detect is 138 copies/mL. A negative result does not preclude SARS-Cov-2 infection and should not be used as the sole basis for treatment or other patient management decisions. A negative result may occur with  improper specimen collection/handling, submission of specimen other than nasopharyngeal swab, presence of viral mutation(s) within the areas targeted by this assay, and inadequate number of viral copies(<138 copies/mL). A negative result must be combined with clinical observations, patient history, and epidemiological information. The expected result is Negative.  Fact Sheet for Patients:  BloggerCourse.com  Fact Sheet for Healthcare Providers:  SeriousBroker.it  This test is no t yet approved or cleared by the [place] FDA and  has been authorized for detection and/or diagnosis of SARS-CoV-2 by FDA under an Emergency Use Authorization (EUA). This EUA will remain  in effect (meaning this test can be used) for the duration of the COVID-19 declaration under Section 564(b)(1) of the Act, 21 U.S.C.section 360bbb-3(b)(1), unless the authorization is terminated  or revoked sooner.       Influenza A by PCR NEGATIVE NEGATIVE Final   Influenza B by PCR NEGATIVE NEGATIVE Final    Comment: (NOTE) The Xpert Xpress SARS-CoV-2/FLU/RSV plus assay is intended as an aid in the diagnosis of influenza from Nasopharyngeal swab specimens and should not be used as a sole basis for treatment. Nasal washings and aspirates are unacceptable for Xpert Xpress SARS-CoV-2/FLU/RSV testing.  Fact Sheet for Patients: BloggerCourse.com  Fact Sheet for Healthcare Providers: SeriousBroker.it  This test is not yet approved or cleared by the [place] FDA and has been authorized for detection and/or diagnosis of SARS-CoV-2  by FDA under an Emergency Use Authorization (EUA). This EUA will remain in effect (meaning this test can be used) for the duration of the COVID-19 declaration under Section 564(b)(1) of the Act, 21 U.S.C. section 360bbb-3(b)(1), unless the authorization is terminated or revoked.     Resp Syncytial Virus by PCR NEGATIVE NEGATIVE Final    Comment: (NOTE) Fact Sheet for Patients: BloggerCourse.com  Fact Sheet for Healthcare Providers: SeriousBroker.it  This test is not yet approved or cleared by the [place] FDA and has been authorized for detection and/or diagnosis of SARS-CoV-2 by FDA under an Emergency Use Authorization (EUA). This EUA will remain in effect (meaning this test can be used) for the duration of the COVID-19 declaration under Section 564(b)(1) of the Act, 21 U.S.C. section 360bbb-3(b)(1), unless the authorization is terminated or revoked.  Performed at [facility], [address] [place], [place] [address]   Culture, blood (Routine X 2)  w Reflex to ID Panel     Status: Abnormal   Collection Time: 07/03/22  9:11 PM   Specimen: BLOOD RIGHT HAND  Result Value Ref Range Status   Specimen Description   Final    BLOOD RIGHT HAND Performed at [facility], [address], [place], [place] [address]    Special Requests   Final    BOTTLES DRAWN AEROBIC AND ANAEROBIC Blood Culture adequate volume Performed at [facility], [address], [place], [place] [address]    Culture  Setup Time   Final    GRAM NEGATIVE RODS Gram Stain Report Called to,Read Back By and Verified With: SHELTON, A. @ [phone] 07/04/2022 BY FRATTO, A. IN BOTH AEROBIC AND ANAEROBIC BOTTLES CRITICAL RESULT CALLED TO, READ BACK BY AND VERIFIED WITH: PHARMD Drusilla Kanner [phone] [id number] BY JRS Performed at [facility], [address] [place], [place] [address]    Culture ESCHERICHIA COLI (A)  Final   Report Status 07/06/2022 FINAL  Final    Organism ID, Bacteria ESCHERICHIA COLI  Final      Susceptibility   Escherichia coli - MIC*    AMPICILLIN 4 SENSITIVE Sensitive     CEFEPIME <=0.12 SENSITIVE Sensitive     CEFTAZIDIME <=1 SENSITIVE Sensitive     CEFTRIAXONE <=0.25 SENSITIVE Sensitive     CIPROFLOXACIN <=0.25 SENSITIVE Sensitive     GENTAMICIN <=1 SENSITIVE Sensitive     IMIPENEM <=0.25 SENSITIVE Sensitive     TRIMETH/SULFA <=20 SENSITIVE Sensitive     AMPICILLIN/SULBACTAM <=2 SENSITIVE Sensitive     PIP/TAZO <=4 SENSITIVE Sensitive     * ESCHERICHIA COLI  Blood Culture ID Panel (Reflexed)     Status: Abnormal   Collection Time: 07/03/22  9:11 PM  Result Value Ref Range Status   Enterococcus faecalis NOT DETECTED NOT DETECTED Final   Enterococcus Faecium NOT DETECTED NOT DETECTED Final   Listeria monocytogenes NOT DETECTED NOT DETECTED Final   Staphylococcus species NOT DETECTED NOT DETECTED Final   Staphylococcus aureus (BCID) NOT DETECTED NOT DETECTED Final   Staphylococcus epidermidis NOT DETECTED NOT DETECTED Final   Staphylococcus lugdunensis NOT DETECTED NOT DETECTED Final   Streptococcus species NOT DETECTED NOT DETECTED Final   Streptococcus agalactiae NOT DETECTED NOT DETECTED Final   Streptococcus pneumoniae NOT DETECTED NOT DETECTED Final   Streptococcus pyogenes NOT DETECTED NOT DETECTED Final   A.calcoaceticus-baumannii NOT DETECTED NOT DETECTED Final   Bacteroides fragilis NOT DETECTED NOT DETECTED Final   Enterobacterales DETECTED (A) NOT DETECTED Final    Comment: Enterobacterales represent a large order of gram negative bacteria, not a single organism. CRITICAL RESULT CALLED TO, READ BACK BY AND VERIFIED WITH: PHARMD DEVAN MITCHELL [phone] [id number] BY JRS    Enterobacter cloacae complex NOT DETECTED NOT DETECTED Final   Escherichia coli DETECTED (A) NOT DETECTED Final    Comment: CRITICAL RESULT CALLED TO, READ BACK BY AND VERIFIED WITH: PHARMD DEVAN MITCHELL [phone] [id number] BY JRS    Klebsiella  aerogenes NOT DETECTED NOT DETECTED Final   Klebsiella oxytoca NOT DETECTED NOT DETECTED Final   Klebsiella pneumoniae NOT DETECTED NOT DETECTED Final   Proteus species NOT DETECTED NOT DETECTED Final   Salmonella species NOT DETECTED NOT DETECTED Final   Serratia marcescens NOT DETECTED NOT DETECTED Final   Haemophilus influenzae NOT DETECTED NOT DETECTED Final   Neisseria meningitidis NOT DETECTED NOT DETECTED Final   Pseudomonas aeruginosa NOT DETECTED NOT DETECTED Final   Stenotrophomonas maltophilia NOT DETECTED NOT DETECTED  Final   Candida albicans NOT DETECTED NOT DETECTED Final   Candida auris NOT DETECTED NOT DETECTED Final   Candida glabrata NOT DETECTED NOT DETECTED Final   Candida krusei NOT DETECTED NOT DETECTED Final   Candida parapsilosis NOT DETECTED NOT DETECTED Final   Candida tropicalis NOT DETECTED NOT DETECTED Final   Cryptococcus neoformans/gattii NOT DETECTED NOT DETECTED Final   CTX-M ESBL NOT DETECTED NOT DETECTED Final   Carbapenem resistance IMP NOT DETECTED NOT DETECTED Final   Carbapenem resistance KPC NOT DETECTED NOT DETECTED Final   Carbapenem resistance NDM NOT DETECTED NOT DETECTED Final   Carbapenem resist OXA 48 LIKE NOT DETECTED NOT DETECTED Final   Carbapenem resistance VIM NOT DETECTED NOT DETECTED Final    Comment: Performed at [facility], [address] [place], [place] [address]  Culture, blood (Routine X 2) w Reflex to ID Panel     Status: Abnormal   Collection Time: 07/03/22  9:13 PM   Specimen: BLOOD LEFT ARM  Result Value Ref Range Status   Specimen Description   Final    BLOOD LEFT ARM Performed at [facility], [address] [place], [place] [address]    Special Requests   Final    BOTTLES DRAWN AEROBIC AND ANAEROBIC Blood Culture adequate volume Performed at [facility], [address] [place], [place] [address]    Culture  Setup Time   Final    GRAM NEGATIVE RODS Gram Stain Report Called to,Read Back By and  Verified With: SHELTON, A. @[phone]  07/04/2022 BY FRATTO, A. IN BOTH AEROBIC AND ANAEROBIC BOTTLES CRITICAL VALUE NOTED.  VALUE IS CONSISTENT WITH PREVIOUSLY REPORTED AND CALLED VALUE.    Culture (A)  Final    ESCHERICHIA COLI SUSCEPTIBILITIES PERFORMED ON PREVIOUS CULTURE WITHIN THE LAST 5 DAYS. Performed at [facility], [address] [place], [place] [address]    Report Status 07/06/2022 FINAL  Final  MRSA Next Gen by PCR, Nasal     Status: None   Collection Time: 07/03/22  9:18 PM   Specimen: Nasal Mucosa; Nasal Swab  Result Value Ref Range Status   MRSA by PCR Next Gen NOT DETECTED NOT DETECTED Final    Comment: (NOTE) The GeneXpert MRSA Assay (FDA approved for NASAL specimens only), is one component of a comprehensive MRSA colonization surveillance program. It is not intended to diagnose MRSA infection nor to guide or monitor treatment for MRSA infections. Test performance is not FDA approved in patients less than 243 years old. Performed at [facility], [address] [place], [place] [address]   Urine Culture     Status: Abnormal   Collection Time: 07/03/22  9:55 PM   Specimen: Urine, Clean Catch  Result Value Ref Range Status   Specimen Description   Final    URINE, CLEAN CATCH Performed at [facility], [address] [place], [place] [address]    Special Requests   Final    NONE Performed at [facility], [address] [place], [place] [address]    Culture >=100,000 COLONIES/mL ESCHERICHIA COLI (A)  Final   Report Status 07/06/2022 FINAL  Final   Organism ID, Bacteria ESCHERICHIA COLI (A)  Final      Susceptibility   Escherichia coli - MIC*    AMPICILLIN 4 SENSITIVE Sensitive     CEFAZOLIN <=4 SENSITIVE Sensitive     CEFEPIME <=0.12 SENSITIVE Sensitive     CEFTRIAXONE <=0.25 SENSITIVE Sensitive     CIPROFLOXACIN <=0.25  SENSITIVE Sensitive     GENTAMICIN <=1 SENSITIVE Sensitive     IMIPENEM <=0.25 SENSITIVE Sensitive     NITROFURANTOIN <=16 SENSITIVE  Sensitive     TRIMETH/SULFA <=20 SENSITIVE Sensitive     AMPICILLIN/SULBACTAM <=2 SENSITIVE Sensitive     PIP/TAZO <=4 SENSITIVE Sensitive     * >=100,000 COLONIES/mL ESCHERICHIA COLI     Scheduled Meds:  amiodarone  200 mg Oral BID   anastrozole  1 mg Oral Daily   apixaban  5 mg Oral BID   Chlorhexidine Gluconate Cloth  6 each Topical Daily   furosemide  40 mg Intravenous BID   insulin aspart  0-5 Units Subcutaneous QHS   insulin aspart  0-9 Units Subcutaneous TID WC   [START ON 07/10/2022] magnesium oxide  400 mg Oral BID   metoprolol tartrate  50 mg Oral BID   mouth rinse  15 mL Mouth Rinse 4 times per day   potassium chloride  40 mEq Oral BID   Continuous Infusions:  cefTRIAXone (ROCEPHIN)  IV Stopped (07/09/22 0840)   magnesium sulfate bolus IVPB 2 g (07/09/22 1456)    Procedures/Studies: DG Pelvis 1-2 Views  Result Date: 07/06/2022 CLINICAL DATA:  Pain EXAM: PELVIS - 1 VIEW COMPARISON:  None Available. FINDINGS: There is no evidence of pelvic fracture or diastasis. No pelvic bone lesions are seen. IMPRESSION: Negative. Electronically Signed   By: Layla Maw M.D.   On: 07/06/2022 10:27   DG FEMUR PORT, MIN 2 VIEWS RIGHT  Result Date: 07/06/2022 CLINICAL DATA:  Fall EXAM: RIGHT FEMUR PORTABLE 2 VIEW COMPARISON:  None Available. FINDINGS: There is no evidence of fracture or other focal bone lesions. Soft tissues are unremarkable. IMPRESSION: Negative. Electronically Signed   By: Layla Maw M.D.   On: 07/06/2022 10:17   [place] EKG SITE RITE  Result Date: 07/05/2022 If Site Rite image not attached, placement could not be confirmed due to current cardiac rhythm.  ECHOCARDIOGRAM COMPLETE  Result Date: 07/05/2022    ECHOCARDIOGRAM REPORT   Patient Name:   MARRISA PROVEAUX Date of Exam: 07/05/2022 Medical Rec #:  [id number]    Height:       64.0 in Accession #:    [id number]   Weight:       147.9 lb Date of Birth:  07-30-1955   BSA:          1.721 m Patient Age:    66 years     BP:            124/53 mmHg Patient Gender: F            HR:           117 bpm. [facility] Procedure: 2D Echo, Cardiac Doppler and Color Doppler Indications:    CHF-Acute Diastolic I50.31  History:        Patient has prior history of Echocardiogram examinations, most                 recent 01/13/2021. Sepsis, Arrythmias:Atrial Fibrillation,                 Signs/Symptoms:Chest Pain; Risk Factors:Hypertension and                 Diabetes.  Sonographer:    Aron Baba Referring Phys: [phone] Roshon Duell  Sonographer Comments: Image acquisition challenging due to patient body habitus and Image acquisition challenging due to respiratory motion. IMPRESSIONS  1. Challenging study for LVEF assessment due to  tachycardia. Left ventricular ejection fraction, by estimation, is 45 to 50%. The left ventricle has mildly decreased function. Left ventricular endocardial border not optimally defined to evaluate regional wall motion. Left ventricular diastolic function could not be evaluated due to atrial fibrillation.  2. Right ventricular systolic function was not well visualized. The right ventricular size is mildly enlarged.  3. Left atrial size was moderately dilated.  4. The mitral valve is grossly normal. Trivial mitral valve regurgitation. No evidence of mitral stenosis.  5. The aortic valve is tricuspid. There is mild calcification of the aortic valve. Aortic valve regurgitation is not visualized. No aortic stenosis is present. Comparison(s): Changes from prior study are noted. LVEF worsened from 55% in 2020 to 45-50% now. FINDINGS  Left Ventricle: Left ventricular ejection fraction, by estimation, is 45 to 50%. The left ventricle has mildly decreased function. Left ventricular endocardial border not optimally defined to evaluate regional wall motion. The left ventricular internal cavity size was normal in size. There is no left ventricular hypertrophy. Left ventricular diastolic function could not be evaluated due to  atrial fibrillation. Left ventricular diastolic function could not be evaluated. Right Ventricle: The right ventricular size is mildly enlarged. No increase in right ventricular wall thickness. Right ventricular systolic function was not well visualized. Left Atrium: Left atrial size was moderately dilated. Right Atrium: Right atrial size was normal in size. Pericardium: There is no evidence of pericardial effusion. Mitral Valve: The mitral valve is grossly normal. Trivial mitral valve regurgitation. No evidence of mitral valve stenosis. Tricuspid Valve: The tricuspid valve is grossly normal. Tricuspid valve regurgitation is mild . No evidence of tricuspid stenosis. Aortic Valve: The aortic valve is tricuspid. There is mild calcification of the aortic valve. Aortic valve regurgitation is not visualized. No aortic stenosis is present. Pulmonic Valve: The pulmonic valve was not well visualized. Pulmonic valve regurgitation is not visualized. No evidence of pulmonic stenosis. Aorta: The aortic root and ascending aorta are structurally normal, with no evidence of dilitation. Venous: The inferior vena cava was not well visualized. IAS/Shunts: The interatrial septum was not well visualized.  LEFT VENTRICLE PLAX 2D LVIDd:         4.20 cm   Diastology LVIDs:         3.20 cm   LV e' medial:    9.25 cm/s LV PW:         1.10 cm   LV E/e' medial:  14.6 LV IVS:        0.70 cm   LV e' lateral:   10.10 cm/s LVOT diam:     1.80 cm   LV E/e' lateral: 13.4 LV SV:         43 LV SV Index:   25 LVOT Area:     2.54 cm  RIGHT VENTRICLE RV S prime:     11.30 cm/s TAPSE (M-mode): 1.7 cm LEFT ATRIUM           Index        RIGHT ATRIUM           Index LA diam:      3.30 cm 1.92 cm/m   RA Area:     16.70 cm LA Vol (A2C): 25.5 ml 14.82 ml/m  RA Volume:   46.90 ml  27.25 ml/m LA Vol (A4C): 76.6 ml 44.51 ml/m  AORTIC VALVE LVOT Vmax:   82.50 cm/s LVOT Vmean:  53.300 cm/s LVOT VTI:    0.170 m  AORTA Ao Root diam: 2.80 cm  Ao Asc diam:  3.10  cm MITRAL VALVE                TRICUSPID VALVE MV Area (PHT): 5.54 cm     TR Peak grad:   27.2 mmHg MV Decel Time: 137 msec     TR Vmax:        261.00 cm/s MR Peak grad: 53.4 mmHg MR Vmax:      365.50 cm/s   SHUNTS MV E velocity: 135.00 cm/s  Systemic VTI:  0.17 m                             Systemic Diam: 1.80 cm Vishnu Priya Mallipeddi Electronically signed by Winfield Rast Mallipeddi Signature Date/Time: 07/05/2022/3:45:55 PM    Final    DG CHEST PORT 1 VIEW  Result Date: 07/05/2022 CLINICAL DATA:  Shortness of breath EXAM: PORTABLE CHEST 1 VIEW COMPARISON:  Yesterday FINDINGS: Cardiomegaly. Interstitial opacity with streaky density in the lower lungs. Small right pleural effusion. No pneumothorax. Porta catheter with tip at the SVC. IMPRESSION: Interstitial and airspace opacity at the bases that could be infection and/or edema. Aeration is worsened from yesterday. Electronically Signed   By: Tiburcio Pea M.D.   On: 07/05/2022 07:56   MM 3D SCREEN BREAST UNI RIGHT  Result Date: 07/04/2022 CLINICAL DATA:  Screening. History left mastectomy 2019. EXAM: DIGITAL SCREENING UNILATERAL RIGHT MAMMOGRAM WITH CAD AND TOMOSYNTHESIS TECHNIQUE: Right screening digital craniocaudal and mediolateral oblique mammograms were obtained. Right screening digital breast tomosynthesis was performed. The images were evaluated with computer-aided detection. COMPARISON:  Previous exam(s). ACR Breast Density Category b: There are scattered areas of fibroglandular density. FINDINGS: There are no findings suspicious for malignancy. Note that a request was initially made to repeat the right CC to obtain more posterior tissue however the patient is currently hospitalized and located in the intensive care unit and therefore would not be able to obtain any additional mammography any time soon. IMPRESSION: No mammographic evidence of malignancy. A result letter of this screening mammogram will be mailed directly to the patient.  RECOMMENDATION: Screening mammogram in one year. (Code:[id number]) BI-RADS CATEGORY  1: Negative. Electronically Signed   By: Edwin Cap M.D.   On: 07/04/2022 15:57   DG Chest Port 1 View  Result Date: 07/04/2022 CLINICAL DATA:  67 year old female with history of dyspnea. EXAM: PORTABLE CHEST 1 VIEW COMPARISON:  Chest x-ray 07/03/2022. FINDINGS: Right subclavian single-lumen power porta cath with tip terminating in the distal superior vena cava. Areas of interstitial prominence and peribronchial cuffing, most evident in the mid to lower lungs bilaterally. Focal opacity in the retrocardiac region on the left likely reflects atelectasis, although developing airspace consolidation is not excluded. No pleural effusions. No pneumothorax. No evidence of pulmonary edema. Heart size is normal. Upper mediastinal contours are within normal limits. IMPRESSION: 1. The appearance of the chest may suggest an acute bronchitis. 2. There is also a retrocardiac opacity in the medial left base which may reflect an area of atelectasis or developing airspace consolidation. Followup PA and lateral chest X-ray is recommended in 3-4 weeks following trial of antibiotic therapy to ensure resolution and exclude underlying malignancy. Electronically Signed   By: Trudie Reed M.D.   On: 07/04/2022 05:45   CT Head Wo Contrast  Result Date: 07/03/2022 CLINICAL DATA:  Nausea and vomiting for 3 days, altered level of consciousness EXAM: CT HEAD WITHOUT CONTRAST TECHNIQUE: Contiguous axial images were obtained  from the base of the skull through the vertex without intravenous contrast. RADIATION DOSE REDUCTION: This exam was performed according to the departmental dose-optimization program which includes automated exposure control, adjustment of the mA and/or kV according to patient size and/or use of iterative reconstruction technique. COMPARISON:  None Available. FINDINGS: Brain: Hypodensity in the left occipital periventricular and  subcortical white matter consistent with age indeterminate ischemic change. No other signs of acute infarct or hemorrhage. Lateral ventricles and midline structures are unremarkable. There are no acute extra-axial fluid collections. No mass effect. Vascular: Atherosclerosis of the internal carotid arteries. No hyperdense vessel. Skull: Normal. Negative for fracture or focal lesion. Sinuses/Orbits: Prominent mucoperiosteal thickening of the bilateral maxillary sinuses, with a small superimposed gas fluid level on the left. Minimal retained secretions within the sphenoid sinus. Other: None. IMPRESSION: 1. Age indeterminate ischemic changes within the left occipital periventricular and subcortical white matter, favor chronic. 2. Otherwise no acute intracranial process. 3. Bilateral maxillary sinus disease, with evidence of superimposed acute left maxillary sinusitis. Electronically Signed   By: Sharlet Salina M.D.   On: 07/03/2022 19:43   DG Chest 2 View  Result Date: 07/03/2022 CLINICAL DATA:  AFib EXAM: CHEST - 2 VIEW COMPARISON:  CXR 06/27/17 FINDINGS: Right-sided chest port in place with the tip in the upper SVC, unchanged from prior exam. No pleural effusion. No pneumothorax. No focal airspace opacity. Normal cardiac and mediastinal contours. No radiographically apparent displaced rib fractures. Visualized upper abdomen is unremarkable. Vertebral body heights are maintained. IMPRESSION: No focal airspace opacity. Electronically Signed   By: Lorenza Cambridge M.D.   On: 07/03/2022 14:44   DG Bone Density  Result Date: 06/26/2022 EXAM: DUAL X-RAY ABSORPTIOMETRY (DXA) FOR BONE MINERAL DENSITY IMPRESSION: Your patient Yuritza Paulhus completed a BMD test on 06/26/2022 using the [employer] DXA System (software version: 14.10) manufactured by [employer]. The following summarizes the results of our evaluation. Technologist: [facility] PATIENT BIOGRAPHICAL: Name: Marieclaire, Bettenhausen Patient ID: [id number] Birth Date:  21-Apr-1955 Height: 64.0 in. Gender: Female Exam Date: 06/26/2022 Weight: 151.0 lbs. Indications: Hx Breast Ca, Caucasian, Secondary Osteoporosis, Follow up Osteopenia, Post Menopausal Fractures: Treatments: Calcium, Anastrozole, Vitamin D DENSITOMETRY RESULTS: Site         Region     Measured Date Measured Age WHO Classification Young Adult T-score BMD         %Change vs. Previous Significant Change (*) DualFemur Neck Left 06/26/2020 68.3 Osteopenia -1.9 0.773 g/cm2 -5.3% Yes DualFemur Neck Left 06/18/2020 66.2 Osteopenia -1.6 0.816 g/cm2 2.6% - DualFemur Neck Left 03/07/2018 64.0 Osteopenia -1.8 0.795 g/cm2 - - DualFemur Total Mean 06/26/2022 68.3 Osteopenia -1.3 0.838 g/cm2 0.0% - DualFemur Total Mean 06/18/2020 66.2 Osteopenia -1.3 0.838 g/cm2 -2.9% Yes DualFemur Total Mean 03/07/2018 64.0 - - 0.863 g/cm2 - - Left Forearm Radius 33% 06/26/2022 68.3 Osteopenia -1.4 0.616 g/cm2 -7.8% Yes Left Forearm Radius 33% 06/18/2020 66.2 Normal -0.6 0.668 g/cm2 -9.6% Yes Left Forearm Radius 33% 03/07/2018 64.0 Normal 0.4 0.739 g/cm2 - - ASSESSMENT: The BMD measured at Femur Neck Left is 0.773 g/cm2 with a T-score of -1.9. This patient is considered osteopenic according to World Health Organization [facility]) criteria. The scan quality is good. Compared with the prior study on 06/18/20, the BMD of the total mean shows no statistically significant change, however, the lt. forearm shows a significant decrease. Lumbar spine was excluded due to advanced degenerative changes. World Health Organization [facility]) criteria for post-menopausal, Caucasian Women: Normal:  T-score at or above -1 SD Osteopenia:   T-score between -1 and -2.5 SD Osteoporosis: T-score at or below -2.5 SD RECOMMENDATIONS: 1. All patients should optimize calcium and vitamin D intake. 2. Consider FDA-approved medical therapies in postmenopausal women and med aged 15 years and older, based on the following: a. A hip or vertebral (clinical or morphometric) fracture b.  T-score< -2.5 at the femoral neck or spine after appropriate evaluation to exclude secondary causes c. Low bone mass (T-score between -1.0 and -2.5 at the femoral neck or spine) and a 10-year probability of a hip fracture > 3% or a 10-year probability of a major osteoporosis-related fracture > 20% based on the [place]-adapted WHO algorithm d. Clinician judgment and/or patient preferences may indicate treatment for people with 10-year fracture probabilities above or below these levels FOLLOW-UP: Patients with diagnosis of osteoporsis or at high risk for fracture should have regular bone mineral density tests. For patients eligible for Medicare, routine testing is allowed once every 2 years. The testing frequency can be increased to one year for patients who have rapidly progressing disease, those who are receiving or discontinuing medical therapy to restore bone mass, or have additional risk factors. I have reviewed this report, and agree with the above findings. [facility] Your patient RAINEY RODGER completed a FRAX assessment on 06/26/2022 using the [employer] DXA System (analysis version: 14.10) manufactured by [employer]. The following summarizes the results of our evaluation. PATIENT BIOGRAPHICAL: Name: Jael, Waldorf Patient ID: [id number] Birth Date: 1955/05/11 Height:    64.0 in. [age]       Weight:    151.0 lbs. Ethnicity:  White                            Exam Date: 06/26/2022 FRAX* RESULTS:  (version: 3.5) 10-year Probability of Fracture1 Major Osteoporotic Fracture2 Hip Fracture 11.3% 1.9% Population: [place] (Caucasian) Risk Factors: Secondary Osteoporosis Based on DualFemur (Left) Neck BMD 1 -The 10-year probability of fracture may be lower than reported if the patient has received treatment. 2 -Major Osteoporotic Fracture: Clinical Spine, Forearm, Hip or Shoulder *FRAX is a Armed forces logistics/support/administrative officer of the [employer] for Metabolic Bone  Disease, a World Science writer (WHO) [employer]. ASSESSMENT: The probability of a major osteoporotic fracture is 11.3% within the next ten years. The probability of a hip fracture is 1.9% within the next ten years. Electronically Signed   By: Gerome Sam III M.D.   On: 06/26/2022 09:39    Catarina Hartshorn, DO  [facility]  If 7PM-7AM, please contact night-coverage [email or web address] Password [email or web address] 07/09/2022, 3:04 PM   LOS: 6 days

## 2022-07-09 NOTE — Progress Notes (Signed)
   07/09/22 1526  ReDS Vest / Clip  Station Marker B  Ruler Value 42  ReDS Value Range < 36  ReDS Actual Value 18  Anatomical Comments sitting up in bed

## 2022-07-10 DIAGNOSIS — G9341 Metabolic encephalopathy: Secondary | ICD-10-CM | POA: Diagnosis not present

## 2022-07-10 DIAGNOSIS — R7881 Bacteremia: Secondary | ICD-10-CM | POA: Diagnosis not present

## 2022-07-10 DIAGNOSIS — N390 Urinary tract infection, site not specified: Secondary | ICD-10-CM | POA: Diagnosis not present

## 2022-07-10 DIAGNOSIS — I482 Chronic atrial fibrillation, unspecified: Secondary | ICD-10-CM | POA: Diagnosis not present

## 2022-07-10 DIAGNOSIS — D649 Anemia, unspecified: Secondary | ICD-10-CM | POA: Diagnosis not present

## 2022-07-10 DIAGNOSIS — C50912 Malignant neoplasm of unspecified site of left female breast: Secondary | ICD-10-CM | POA: Diagnosis not present

## 2022-07-10 DIAGNOSIS — J811 Chronic pulmonary edema: Secondary | ICD-10-CM | POA: Diagnosis not present

## 2022-07-10 DIAGNOSIS — M6281 Muscle weakness (generalized): Secondary | ICD-10-CM | POA: Diagnosis not present

## 2022-07-10 DIAGNOSIS — R5381 Other malaise: Secondary | ICD-10-CM | POA: Diagnosis not present

## 2022-07-10 DIAGNOSIS — I509 Heart failure, unspecified: Secondary | ICD-10-CM | POA: Diagnosis not present

## 2022-07-10 DIAGNOSIS — R6520 Severe sepsis without septic shock: Secondary | ICD-10-CM | POA: Diagnosis not present

## 2022-07-10 DIAGNOSIS — R279 Unspecified lack of coordination: Secondary | ICD-10-CM | POA: Diagnosis not present

## 2022-07-10 DIAGNOSIS — J9600 Acute respiratory failure, unspecified whether with hypoxia or hypercapnia: Secondary | ICD-10-CM | POA: Diagnosis not present

## 2022-07-10 DIAGNOSIS — N179 Acute kidney failure, unspecified: Secondary | ICD-10-CM | POA: Diagnosis not present

## 2022-07-10 DIAGNOSIS — E119 Type 2 diabetes mellitus without complications: Secondary | ICD-10-CM | POA: Diagnosis not present

## 2022-07-10 DIAGNOSIS — Z7401 Bed confinement status: Secondary | ICD-10-CM | POA: Diagnosis not present

## 2022-07-10 DIAGNOSIS — I10 Essential (primary) hypertension: Secondary | ICD-10-CM | POA: Diagnosis not present

## 2022-07-10 DIAGNOSIS — E1165 Type 2 diabetes mellitus with hyperglycemia: Secondary | ICD-10-CM | POA: Diagnosis not present

## 2022-07-10 DIAGNOSIS — I4891 Unspecified atrial fibrillation: Secondary | ICD-10-CM | POA: Diagnosis not present

## 2022-07-10 LAB — BASIC METABOLIC PANEL
Anion gap: 6 (ref 5–15)
BUN: 21 mg/dL (ref 8–23)
CO2: 29 mmol/L (ref 22–32)
Calcium: 8.8 mg/dL — ABNORMAL LOW (ref 8.9–10.3)
Chloride: 98 mmol/L (ref 98–111)
Creatinine, Ser: 0.84 mg/dL (ref 0.44–1.00)
GFR, Estimated: >60 mL/min (ref >60)
Glucose, Bld: 139 mg/dL — ABNORMAL HIGH (ref 70–99)
Potassium: 4.5 mmol/L (ref 3.5–5.1)
Sodium: 133 mmol/L — ABNORMAL LOW (ref 135–145)

## 2022-07-10 LAB — MAGNESIUM: Magnesium: 1.8 mg/dL (ref 1.7–2.4)

## 2022-07-10 LAB — GLUCOSE, CAPILLARY
Glucose-Capillary: 125 mg/dL — ABNORMAL HIGH (ref 70–99)
Glucose-Capillary: 135 mg/dL — ABNORMAL HIGH (ref 70–99)
Glucose-Capillary: 152 mg/dL — ABNORMAL HIGH (ref 70–99)

## 2022-07-10 MED ORDER — MAGNESIUM OXIDE -MG SUPPLEMENT 400 (240 MG) MG PO TABS: 400.0000 mg | ORAL_TABLET | Freq: Every day | ORAL | Status: AC

## 2022-07-10 MED ORDER — CEFDINIR 300 MG PO CAPS: 300.0000 mg | ORAL_CAPSULE | Freq: Two times a day (BID) | ORAL | Status: DC

## 2022-07-10 MED ORDER — AMIODARONE HCL 200 MG PO TABS: 200.0000 mg | ORAL_TABLET | Freq: Two times a day (BID) | ORAL | Status: DC

## 2022-07-10 MED ORDER — FUROSEMIDE 40 MG PO TABS: 40.0000 mg | ORAL_TABLET | Freq: Every day | ORAL | Status: DC

## 2022-07-10 NOTE — Discharge Summary (Signed)
Physician Discharge Summary   Patient: Lisa Thornton MRN: [id number] DOB: May 26, 1955  Admit date:     07/03/2022  Discharge date: 07/10/22  Discharge Physician: Onalee Hua Chareese Sergent   PCP: Anabel Halon, MD   Recommendations at discharge:   Please follow up with primary care provider within 1-2 weeks  Please repeat BMP, mag and CBC in one week     Hospital Course: 67 y.o. female with medical history significant for   Atria fibrillation, hypertension, diabetes mellitus, breast cancer. Patient was brought to the ED reports of nausea vomiting over the past 3 days, and generalized weakness.  Went to the urgent care and was referred to the ED. At the time of my evaluation, patient is a bit confused and denies symptoms previously reported, answers a few questions.  Patient's grand niece-Lisa Thornton is at bedside and provides most of the history.  She reports that over the past 3 days, patient has had 1-2 episodes of vomiting daily.  No diarrhea.  No abdominal pain.  Blood sugars were in the 400s yesterday.  Patient denies chest pain or urinary symptoms.  It is unknown if she has been taking her medications over the past few days.  Patient's nurse reported today for in the ED, patient's speech has become a bit confused, like talking about her mother who died in Aug 15, 2015.   ED Course: Tmax 99.9.  Heart rate up to 150s.  Respiratory rate 18- 33.  Blood pressure systolic 102 -135.  Potassium 3.1.  Magnesium 1.1.  Chest x-ray clear.  WBC 17.9.  Creatinine 1.4.  Cardizem drip started with improvement in heart rate.  1 L bolus given.  Magnesium and potassium supplementation started.  Hospitalist to admit for atrial fibrillation with RVR, possible encephalopathy.  Patient was initially started on vancomycin, cefepime, metronidazole.  Blood cultures and urine cultures grew E. coli.  Her antibiotics were de-escalated to ceftriaxone.  Chest x-ray showed right lower lobe opacity.  She was treated for pneumonia.  After fluid  resuscitation, the patient became fluid overloaded.  She was started on IV Lasix.  She developed atrial fibrillation with RVR.  She was initially started on diltiazem drip.  This was transitioned to amiodarone secondary to soft blood pressure.   As her sepsis physiology resolved, her atrial fibrillation improved.  Assessment and Plan: Acute respiratory failure with hypoxia -Secondary to pulmonary edema and pneumonia -Initially on BiPAP -Wean oxygen as tolerated -now weaned to 5L>>1.5L>>now on RA   Lobar pneumonia -Personally reviewed chest x-ray--increased interstitial prominence, right lower lobe opacity, left retrocardiac opacity -PCT 69.47 -Continue ceftriaxone during hospitalization   Severe sepsis -Secondary to pneumonia and bacteremia -Present on admission -Meeting severe criteria with tachycardia heart rates up to 150s, ranging from 180-158, tachypnea respirate rate 18-33, leukocytosis of 17.9.  -Lactic acid peaked 2.0 -sepsis physiology resolved   Atrial fibrillation with RVR, type unspecified -d/c diltiazem drip -amiodarone drip started>>rate now controlled -07/06/21--transitioned to po amio -Continue metoprolol -Continue apixaban -continue amiodarone 200 mg through 07/20/22, then decrease to amiodarone 200 mg once daily starting 07/21/22   Acute HFmrEF -remains clinically fluid overloaded -continue lasix IV>>increased to bid>>switch to po lasix -4/3 echo EF 45-50%, mildly enlarged RV -daily weight  150 lb>>144.4 lb -ReDS vest 24>>18 (07/09/22) D/c with lasix 40 mg po once daily Check BMP and mag in one week after d/c   AKI -Secondary to sepsis and hypotension -Baseline creatinine 0.8-1.0 -Presented with serum creatinine 1.40 -improved -serum creatinine 0.84 on day of dc  E. coli bacteremia -Discontinue vancomycin and cefepime -Continue ceftriaxone during hospitalization -d/c with cefdinir x 3 more days after d/c to complete 10 days tx   E. coli UTI -UA 11-20  WBC -Continue ceftriaxone during hospitalization -d/c with cefdinir x 3 more days after d/c   Acute metabolic Encephalopathy -due to infectious process -improving with treating infection -now back to baseline   Uncontrolled diabetes mellitus type 2 with hyperglycemia -04/26/2022 hemoglobin A1c 7.8 -NovoLog sliding scale -resume metformin and Rybelsus after d/c -CBGs well controlled during hospitalization   Left breast cancer -Patient follows Dr. Ellin Saba -stage IIIb poorly differentiated left breast cancer, status post left modified radical mastectomy, chemotherapy. -Resume anastrozole   Hypertension -initially on diltiazem drip>>amio due to soft BPs -Continue metoprolol -BP's now well controlled       Consultants: cardiology Procedures performed: none  Disposition: Skilled nursing facility Diet recommendation:  Carb modified diet DISCHARGE MEDICATION: Allergies as of 07/10/2022   No Known Allergies      Medication List     STOP taking these medications    amLODipine 5 MG tablet Commonly known as: NORVASC   hydrochlorothiazide 25 MG tablet Commonly known as: HYDRODIURIL   ondansetron 4 MG tablet Commonly known as: Zofran   sulfamethoxazole-trimethoprim 800-160 MG tablet Commonly known as: BACTRIM DS       TAKE these medications    Accu-Chek Guide test strip Generic drug: glucose blood USE 1 STRIP TO CHECK GLUCOSE THREE TIMES DAILY IN THE MORNING, NOON, AND AT BEDTIME AS DIRECTED   Accu-Chek Softclix Lancets lancets USE 1  TO CHECK GLUCOSE UP TO 4 TIMES DAILY   amiodarone 200 MG tablet Commonly known as: PACERONE Take 1 tablet (200 mg total) by mouth 2 (two) times daily. Through 07/20/22.  Then 200 mg once daily starting 07/21/22   anastrozole 1 MG tablet Commonly known as: ARIMIDEX Take 1 tablet by mouth once daily   apixaban 5 MG Tabs tablet Commonly known as: Eliquis Take 1 tablet (5 mg total) by mouth 2 (two) times daily.   blood  glucose meter kit and supplies Dispense based on patient and insurance preference. Use up to four times daily as directed. (FOR ICD-10 E10.9, E11.9).   calcium carbonate 600 MG Tabs tablet Commonly known as: OS-CAL Take 600 mg by mouth daily with breakfast.   cefdinir 300 MG capsule Commonly known as: OMNICEF Take 1 capsule (300 mg total) by mouth every 12 (twelve) hours. X 3 more days   cholecalciferol 25 MCG (1000 UNIT) tablet Commonly known as: VITAMIN D3 Take 1,000 Units by mouth 2 (two) times daily.   CORICIDIN HBP COLD/COUGH/FLU PO Take 1 tablet by mouth daily as needed (cough).   Fish Oil 1000 MG Caps Take 1 capsule by mouth 2 (two) times daily.   furosemide 40 MG tablet Commonly known as: LASIX Take 1 tablet (40 mg total) by mouth daily. Start taking on: July 11, 2022   magnesium oxide 400 (240 Mg) MG tablet Commonly known as: MAG-OX Take 1 tablet (400 mg total) by mouth daily.   metFORMIN 1000 MG tablet Commonly known as: GLUCOPHAGE TAKE 1 TABLET BY MOUTH BEFORE BREAKFAST What changed: See the new instructions.   metoprolol tartrate 50 MG tablet Commonly known as: LOPRESSOR Take 1 tablet by mouth twice daily   rosuvastatin 5 MG tablet Commonly known as: CRESTOR Take 1 tablet (5 mg total) by mouth daily.   Rybelsus 7 MG Tabs Generic drug: Semaglutide Take 1 tablet (7 mg total) by  mouth daily.        Contact information for after-discharge care     Destination     [facility] .   Service: Skilled Nursing Contact information: [address] [place] [address] [phone]                    Discharge Exam: Filed Weights   07/03/22 2206 07/07/22 0559 07/08/22 0500  Weight: 67.1 kg 68.1 kg 65.5 kg   HEENT:  [place]/AT, No thrush, no icterus CV:  RRR, no rub, no S3, no S4 Lung:  fine bibasilar crackles.  No wheeze Abd:  soft/+BS, NT Ext:  Non pitting LE edema, no lymphangitis, no synovitis, no  rash   Condition at discharge: stable  The results of significant diagnostics from this hospitalization (including imaging, microbiology, ancillary and laboratory) are listed below for reference.   Imaging Studies: DG Pelvis 1-2 Views  Result Date: 07/06/2022 CLINICAL DATA:  Pain EXAM: PELVIS - 1 VIEW COMPARISON:  None Available. FINDINGS: There is no evidence of pelvic fracture or diastasis. No pelvic bone lesions are seen. IMPRESSION: Negative. Electronically Signed   By: Layla Maw M.D.   On: 07/06/2022 10:27   DG FEMUR PORT, MIN 2 VIEWS RIGHT  Result Date: 07/06/2022 CLINICAL DATA:  Fall EXAM: RIGHT FEMUR PORTABLE 2 VIEW COMPARISON:  None Available. FINDINGS: There is no evidence of fracture or other focal bone lesions. Soft tissues are unremarkable. IMPRESSION: Negative. Electronically Signed   By: Layla Maw M.D.   On: 07/06/2022 10:17   [place] EKG SITE RITE  Result Date: 07/05/2022 If Site Rite image not attached, placement could not be confirmed due to current cardiac rhythm.  ECHOCARDIOGRAM COMPLETE  Result Date: 07/05/2022    ECHOCARDIOGRAM REPORT   Patient Name:   BRIHANNA DEVENPORT Date of Exam: 07/05/2022 Medical Rec #:  [id number]    Height:       64.0 in Accession #:    [id number]   Weight:       147.9 lb Date of Birth:  [birth year]   BSA:          1.721 m Patient Age:    66 years     BP:           124/53 mmHg Patient Gender: F            HR:           117 bpm. [facility] Procedure: 2D Echo, Cardiac Doppler and Color Doppler Indications:    CHF-Acute Diastolic I50.31  History:        Patient has prior history of Echocardiogram examinations, most                 recent 01/13/2021. Sepsis, Arrythmias:Atrial Fibrillation,                 Signs/Symptoms:Chest Pain; Risk Factors:Hypertension and                 Diabetes.  Sonographer:    Aron Baba Referring Phys: [phone] Roxanne Panek  Sonographer Comments: Image acquisition challenging due to patient body habitus and Image  acquisition challenging due to respiratory motion. IMPRESSIONS  1. Challenging study for LVEF assessment due to tachycardia. Left ventricular ejection fraction, by estimation, is 45 to 50%. The left ventricle has mildly decreased function. Left ventricular endocardial border not optimally defined to evaluate regional wall motion. Left ventricular diastolic function could not be evaluated due to  atrial fibrillation.  2. Right ventricular systolic function was not well visualized. The right ventricular size is mildly enlarged.  3. Left atrial size was moderately dilated.  4. The mitral valve is grossly normal. Trivial mitral valve regurgitation. No evidence of mitral stenosis.  5. The aortic valve is tricuspid. There is mild calcification of the aortic valve. Aortic valve regurgitation is not visualized. No aortic stenosis is present. Comparison(s): Changes from prior study are noted. LVEF worsened from 55% in 2020 to 45-50% now. FINDINGS  Left Ventricle: Left ventricular ejection fraction, by estimation, is 45 to 50%. The left ventricle has mildly decreased function. Left ventricular endocardial border not optimally defined to evaluate regional wall motion. The left ventricular internal cavity size was normal in size. There is no left ventricular hypertrophy. Left ventricular diastolic function could not be evaluated due to atrial fibrillation. Left ventricular diastolic function could not be evaluated. Right Ventricle: The right ventricular size is mildly enlarged. No increase in right ventricular wall thickness. Right ventricular systolic function was not well visualized. Left Atrium: Left atrial size was moderately dilated. Right Atrium: Right atrial size was normal in size. Pericardium: There is no evidence of pericardial effusion. Mitral Valve: The mitral valve is grossly normal. Trivial mitral valve regurgitation. No evidence of mitral valve stenosis. Tricuspid Valve: The tricuspid valve is grossly normal.  Tricuspid valve regurgitation is mild . No evidence of tricuspid stenosis. Aortic Valve: The aortic valve is tricuspid. There is mild calcification of the aortic valve. Aortic valve regurgitation is not visualized. No aortic stenosis is present. Pulmonic Valve: The pulmonic valve was not well visualized. Pulmonic valve regurgitation is not visualized. No evidence of pulmonic stenosis. Aorta: The aortic root and ascending aorta are structurally normal, with no evidence of dilitation. Venous: The inferior vena cava was not well visualized. IAS/Shunts: The interatrial septum was not well visualized.  LEFT VENTRICLE PLAX 2D LVIDd:         4.20 cm   Diastology LVIDs:         3.20 cm   LV e' medial:    9.25 cm/s LV PW:         1.10 cm   LV E/e' medial:  14.6 LV IVS:        0.70 cm   LV e' lateral:   10.10 cm/s LVOT diam:     1.80 cm   LV E/e' lateral: 13.4 LV SV:         43 LV SV Index:   25 LVOT Area:     2.54 cm  RIGHT VENTRICLE RV S prime:     11.30 cm/s TAPSE (M-mode): 1.7 cm LEFT ATRIUM           Index        RIGHT ATRIUM           Index LA diam:      3.30 cm 1.92 cm/m   RA Area:     16.70 cm LA Vol (A2C): 25.5 ml 14.82 ml/m  RA Volume:   46.90 ml  27.25 ml/m LA Vol (A4C): 76.6 ml 44.51 ml/m  AORTIC VALVE LVOT Vmax:   82.50 cm/s LVOT Vmean:  53.300 cm/s LVOT VTI:    0.170 m  AORTA Ao Root diam: 2.80 cm Ao Asc diam:  3.10 cm MITRAL VALVE                TRICUSPID VALVE MV Area (PHT): 5.54 cm     TR Peak grad:   27.2  mmHg MV Decel Time: 137 msec     TR Vmax:        261.00 cm/s MR Peak grad: 53.4 mmHg MR Vmax:      365.50 cm/s   SHUNTS MV E velocity: 135.00 cm/s  Systemic VTI:  0.17 m                             Systemic Diam: 1.80 cm Vishnu Priya Mallipeddi Electronically signed by Winfield Rast Mallipeddi Signature Date/Time: 07/05/2022/3:45:55 PM    Final    DG CHEST PORT 1 VIEW  Result Date: 07/05/2022 CLINICAL DATA:  Shortness of breath EXAM: PORTABLE CHEST 1 VIEW COMPARISON:  Yesterday FINDINGS:  Cardiomegaly. Interstitial opacity with streaky density in the lower lungs. Small right pleural effusion. No pneumothorax. Porta catheter with tip at the SVC. IMPRESSION: Interstitial and airspace opacity at the bases that could be infection and/or edema. Aeration is worsened from yesterday. Electronically Signed   By: Tiburcio Pea M.D.   On: 07/05/2022 07:56   MM 3D SCREEN BREAST UNI RIGHT  Result Date: 07/04/2022 CLINICAL DATA:  Screening. History left mastectomy 2019. EXAM: DIGITAL SCREENING UNILATERAL RIGHT MAMMOGRAM WITH CAD AND TOMOSYNTHESIS TECHNIQUE: Right screening digital craniocaudal and mediolateral oblique mammograms were obtained. Right screening digital breast tomosynthesis was performed. The images were evaluated with computer-aided detection. COMPARISON:  Previous exam(s). ACR Breast Density Category b: There are scattered areas of fibroglandular density. FINDINGS: There are no findings suspicious for malignancy. Note that a request was initially made to repeat the right CC to obtain more posterior tissue however the patient is currently hospitalized and located in the intensive care unit and therefore would not be able to obtain any additional mammography any time soon. IMPRESSION: No mammographic evidence of malignancy. A result letter of this screening mammogram will be mailed directly to the patient. RECOMMENDATION: Screening mammogram in one year. (Code:[id number]-[id number]) BI-RADS CATEGORY  1: Negative. Electronically Signed   By: Edwin Cap M.D.   On: 07/04/2022 15:57   DG Chest Port 1 View  Result Date: 07/04/2022 CLINICAL DATA:  67 year old female with history of dyspnea. EXAM: PORTABLE CHEST 1 VIEW COMPARISON:  Chest x-ray 07/03/2022. FINDINGS: Right subclavian single-lumen power porta cath with tip terminating in the distal superior vena cava. Areas of interstitial prominence and peribronchial cuffing, most evident in the mid to lower lungs bilaterally. Focal opacity in the  retrocardiac region on the left likely reflects atelectasis, although developing airspace consolidation is not excluded. No pleural effusions. No pneumothorax. No evidence of pulmonary edema. Heart size is normal. Upper mediastinal contours are within normal limits. IMPRESSION: 1. The appearance of the chest may suggest an acute bronchitis. 2. There is also a retrocardiac opacity in the medial left base which may reflect an area of atelectasis or developing airspace consolidation. Followup PA and lateral chest X-ray is recommended in 3-4 weeks following trial of antibiotic therapy to ensure resolution and exclude underlying malignancy. Electronically Signed   By: Trudie Reed M.D.   On: 07/04/2022 05:45   CT Head Wo Contrast  Result Date: 07/03/2022 CLINICAL DATA:  Nausea and vomiting for 3 days, altered level of consciousness EXAM: CT HEAD WITHOUT CONTRAST TECHNIQUE: Contiguous axial images were obtained from the base of the skull through the vertex without intravenous contrast. RADIATION DOSE REDUCTION: This exam was performed according to the departmental dose-optimization program which includes automated exposure control, adjustment of the mA and/or kV according to patient size  and/or use of iterative reconstruction technique. COMPARISON:  None Available. FINDINGS: Brain: Hypodensity in the left occipital periventricular and subcortical white matter consistent with age indeterminate ischemic change. No other signs of acute infarct or hemorrhage. Lateral ventricles and midline structures are unremarkable. There are no acute extra-axial fluid collections. No mass effect. Vascular: Atherosclerosis of the internal carotid arteries. No hyperdense vessel. Skull: Normal. Negative for fracture or focal lesion. Sinuses/Orbits: Prominent mucoperiosteal thickening of the bilateral maxillary sinuses, with a small superimposed gas fluid level on the left. Minimal retained secretions within the sphenoid sinus. Other:  None. IMPRESSION: 1. Age indeterminate ischemic changes within the left occipital periventricular and subcortical white matter, favor chronic. 2. Otherwise no acute intracranial process. 3. Bilateral maxillary sinus disease, with evidence of superimposed acute left maxillary sinusitis. Electronically Signed   By: Sharlet Salina M.D.   On: 07/03/2022 19:43   DG Chest 2 View  Result Date: 07/03/2022 CLINICAL DATA:  AFib EXAM: CHEST - 2 VIEW COMPARISON:  CXR 06/27/17 FINDINGS: Right-sided chest port in place with the tip in the upper SVC, unchanged from prior exam. No pleural effusion. No pneumothorax. No focal airspace opacity. Normal cardiac and mediastinal contours. No radiographically apparent displaced rib fractures. Visualized upper abdomen is unremarkable. Vertebral body heights are maintained. IMPRESSION: No focal airspace opacity. Electronically Signed   By: Lorenza Cambridge M.D.   On: 07/03/2022 14:44   DG Bone Density  Result Date: 06/26/2022 EXAM: DUAL X-RAY ABSORPTIOMETRY (DXA) FOR BONE MINERAL DENSITY IMPRESSION: Your patient Laasya Almazan completed a BMD test on 06/26/2022 using the [employer] DXA System (software version: 14.10) manufactured by [employer]. The following summarizes the results of our evaluation. Technologist: [facility] PATIENT BIOGRAPHICAL: Name: Jakyra, Offner Patient ID: [id number] Birth Date: Aug 21, 1955 Height: 64.0 in. Gender: Female Exam Date: 06/26/2022 Weight: 151.0 lbs. Indications: Hx Breast Ca, Caucasian, Secondary Osteoporosis, Follow up Osteopenia, Post Menopausal Fractures: Treatments: Calcium, Anastrozole, Vitamin D DENSITOMETRY RESULTS: Site         Region     Measured Date Measured Age WHO Classification Young Adult T-score BMD         %Change vs. Previous Significant Change (*) DualFemur Neck Left 06/26/2020 68.3 Osteopenia -1.9 0.773 g/cm2 -5.3% Yes DualFemur Neck Left 06/18/2020 66.2 Osteopenia -1.6 0.816 g/cm2 2.6% - DualFemur Neck Left 03/07/2018 64.0  Osteopenia -1.8 0.795 g/cm2 - - DualFemur Total Mean 06/26/2022 68.3 Osteopenia -1.3 0.838 g/cm2 0.0% - DualFemur Total Mean 06/18/2020 66.2 Osteopenia -1.3 0.838 g/cm2 -2.9% Yes DualFemur Total Mean 03/07/2018 64.0 - - 0.863 g/cm2 - - Left Forearm Radius 33% 06/26/2022 68.3 Osteopenia -1.4 0.616 g/cm2 -7.8% Yes Left Forearm Radius 33% 06/18/2020 66.2 Normal -0.6 0.668 g/cm2 -9.6% Yes Left Forearm Radius 33% 03/07/2018 64.0 Normal 0.4 0.739 g/cm2 - - ASSESSMENT: The BMD measured at Femur Neck Left is 0.773 g/cm2 with a T-score of -1.9. This patient is considered osteopenic according to World Health Organization [facility]) criteria. The scan quality is good. Compared with the prior study on 06/18/20, the BMD of the total mean shows no statistically significant change, however, the lt. forearm shows a significant decrease. Lumbar spine was excluded due to advanced degenerative changes. World Science writer [facility]) criteria for post-menopausal, Caucasian Women: Normal:       T-score at or above -1 SD Osteopenia:   T-score between -1 and -2.5 SD Osteoporosis: T-score at or below -2.5 SD RECOMMENDATIONS: 1. All patients should optimize calcium and vitamin D intake. 2. Consider FDA-approved medical therapies in  postmenopausal women and med aged 33 years and older, based on the following: a. A hip or vertebral (clinical or morphometric) fracture b. T-score< -2.5 at the femoral neck or spine after appropriate evaluation to exclude secondary causes c. Low bone mass (T-score between -1.0 and -2.5 at the femoral neck or spine) and a 10-year probability of a hip fracture > 3% or a 10-year probability of a major osteoporosis-related fracture > 20% based on the [place]-adapted WHO algorithm d. Clinician judgment and/or patient preferences may indicate treatment for people with 10-year fracture probabilities above or below these levels FOLLOW-UP: Patients with diagnosis of osteoporsis or at high risk for fracture should have regular bone  mineral density tests. For patients eligible for Medicare, routine testing is allowed once every 2 years. The testing frequency can be increased to one year for patients who have rapidly progressing disease, those who are receiving or discontinuing medical therapy to restore bone mass, or have additional risk factors. I have reviewed this report, and agree with the above findings. [facility] Your patient KHALA TARTE completed a FRAX assessment on 06/26/2022 using the [employer] DXA System (analysis version: 14.10) manufactured by [employer]. The following summarizes the results of our evaluation. PATIENT BIOGRAPHICAL: Name: Maryln, Eastham Patient ID: [id number] Birth Date: [birth year] Height:    64.0 in. Gender:     [age]       Weight:    151.0 lbs. Ethnicity:  White                            Exam Date: 06/26/2022 FRAX* RESULTS:  (version: 3.5) 10-year Probability of Fracture1 Major Osteoporotic Fracture2 Hip Fracture 11.3% 1.9% Population: [place] (Caucasian) Risk Factors: Secondary Osteoporosis Based on DualFemur (Left) Neck BMD 1 -The 10-year probability of fracture may be lower than reported if the patient has received treatment. 2 -Major Osteoporotic Fracture: Clinical Spine, Forearm, Hip or Shoulder *FRAX is a Armed forces logistics/support/administrative officer of the [employer] of [employer] for Metabolic Bone Disease, a World Science writer (WHO) [employer]. ASSESSMENT: The probability of a major osteoporotic fracture is 11.3% within the next ten years. The probability of a hip fracture is 1.9% within the next ten years. Electronically Signed   By: Gerome Sam III M.D.   On: 06/26/2022 09:39    Microbiology: Results for orders placed or performed during the hospital encounter of 07/03/22  Resp panel by RT-PCR (RSV, Flu A&B, Covid) Anterior Nasal Swab     Status: None   Collection Time: 07/03/22  6:56 PM   Specimen: Anterior Nasal Swab  Result Value Ref Range  Status   SARS Coronavirus 2 by RT PCR NEGATIVE NEGATIVE Final    Comment: (NOTE) SARS-CoV-2 target nucleic acids are NOT DETECTED.  The SARS-CoV-2 RNA is generally detectable in upper respiratory specimens during the acute phase of infection. The lowest concentration of SARS-CoV-2 viral copies this assay can detect is 138 copies/mL. A negative result does not preclude SARS-Cov-2 infection and should not be used as the sole basis for treatment or other patient management decisions. A negative result may occur with  improper specimen collection/handling, submission of specimen other than nasopharyngeal swab, presence of viral mutation(s) within the areas targeted by this assay, and inadequate number of viral copies(<138 copies/mL). A negative result must be combined with clinical observations, patient history, and epidemiological information. The expected result  is Negative.  Fact Sheet for Patients:  BloggerCourse.com  Fact Sheet for Healthcare Providers:  SeriousBroker.it  This test is no t yet approved or cleared by the [place] FDA and  has been authorized for detection and/or diagnosis of SARS-CoV-2 by FDA under an Emergency Use Authorization (EUA). This EUA will remain  in effect (meaning this test can be used) for the duration of the COVID-19 declaration under Section 564(b)(1) of the Act, 21 U.S.C.section 360bbb-3(b)(1), unless the authorization is terminated  or revoked sooner.       Influenza A by PCR NEGATIVE NEGATIVE Final   Influenza B by PCR NEGATIVE NEGATIVE Final    Comment: (NOTE) The Xpert Xpress SARS-CoV-2/FLU/RSV plus assay is intended as an aid in the diagnosis of influenza from Nasopharyngeal swab specimens and should not be used as a sole basis for treatment. Nasal washings and aspirates are unacceptable for Xpert Xpress SARS-CoV-2/FLU/RSV testing.  Fact Sheet for  Patients: BloggerCourse.com  Fact Sheet for Healthcare Providers: SeriousBroker.it  This test is not yet approved or cleared by the [place] FDA and has been authorized for detection and/or diagnosis of SARS-CoV-2 by FDA under an Emergency Use Authorization (EUA). This EUA will remain in effect (meaning this test can be used) for the duration of the COVID-19 declaration under Section 564(b)(1) of the Act, 21 U.S.C. section 360bbb-3(b)(1), unless the authorization is terminated or revoked.     Resp Syncytial Virus by PCR NEGATIVE NEGATIVE Final    Comment: (NOTE) Fact Sheet for Patients: BloggerCourse.com  Fact Sheet for Healthcare Providers: SeriousBroker.it  This test is not yet approved or cleared by the [place] FDA and has been authorized for detection and/or diagnosis of SARS-CoV-2 by FDA under an Emergency Use Authorization (EUA). This EUA will remain in effect (meaning this test can be used) for the duration of the COVID-19 declaration under Section 564(b)(1) of the Act, 21 U.S.C. section 360bbb-3(b)(1), unless the authorization is terminated or revoked.  Performed at [facility], [address], [place], [place] [address]   Culture, blood (Routine X 2) w Reflex to ID Panel     Status: Abnormal   Collection Time: 07/03/22  9:11 PM   Specimen: BLOOD RIGHT HAND  Result Value Ref Range Status   Specimen Description   Final    BLOOD RIGHT HAND Performed at [facility], [address], [place], [place] [address]    Special Requests   Final    BOTTLES DRAWN AEROBIC AND ANAEROBIC Blood Culture adequate volume Performed at [facility], [address] [place], [place] [address]    Culture  Setup Time   Final    GRAM NEGATIVE RODS Gram Stain Report Called to,Read Back By and Verified With: SHELTON, A. @ [phone] 07/04/2022 BY FRATTO, A. IN BOTH AEROBIC AND  ANAEROBIC BOTTLES CRITICAL RESULT CALLED TO, READ BACK BY AND VERIFIED WITH: PHARMD Drusilla Kanner [id number] BY JRS Performed at [facility], [address] [place], [place] [address]    Culture ESCHERICHIA COLI (A)  Final   Report Status 07/06/2022 FINAL  Final   Organism ID, Bacteria ESCHERICHIA COLI  Final      Susceptibility   Escherichia coli - MIC*    AMPICILLIN 4 SENSITIVE Sensitive     CEFEPIME <=0.12 SENSITIVE Sensitive     CEFTAZIDIME <=1 SENSITIVE Sensitive     CEFTRIAXONE <=0.25 SENSITIVE Sensitive     CIPROFLOXACIN <=0.25 SENSITIVE Sensitive     GENTAMICIN <=1 SENSITIVE Sensitive  IMIPENEM <=0.25 SENSITIVE Sensitive     TRIMETH/SULFA <=20 SENSITIVE Sensitive     AMPICILLIN/SULBACTAM <=2 SENSITIVE Sensitive     PIP/TAZO <=4 SENSITIVE Sensitive     * ESCHERICHIA COLI  Blood Culture ID Panel (Reflexed)     Status: Abnormal   Collection Time: 07/03/22  9:11 PM  Result Value Ref Range Status   Enterococcus faecalis NOT DETECTED NOT DETECTED Final   Enterococcus Faecium NOT DETECTED NOT DETECTED Final   Listeria monocytogenes NOT DETECTED NOT DETECTED Final   Staphylococcus species NOT DETECTED NOT DETECTED Final   Staphylococcus aureus (BCID) NOT DETECTED NOT DETECTED Final   Staphylococcus epidermidis NOT DETECTED NOT DETECTED Final   Staphylococcus lugdunensis NOT DETECTED NOT DETECTED Final   Streptococcus species NOT DETECTED NOT DETECTED Final   Streptococcus agalactiae NOT DETECTED NOT DETECTED Final   Streptococcus pneumoniae NOT DETECTED NOT DETECTED Final   Streptococcus pyogenes NOT DETECTED NOT DETECTED Final   A.calcoaceticus-baumannii NOT DETECTED NOT DETECTED Final   Bacteroides fragilis NOT DETECTED NOT DETECTED Final   Enterobacterales DETECTED (A) NOT DETECTED Final    Comment: Enterobacterales represent a large order of gram negative bacteria, not a single organism. CRITICAL RESULT CALLED TO, READ BACK BY AND VERIFIED WITH: PHARMD DEVAN  MITCHELL [phone] [id number] BY JRS    Enterobacter cloacae complex NOT DETECTED NOT DETECTED Final   Escherichia coli DETECTED (A) NOT DETECTED Final    Comment: CRITICAL RESULT CALLED TO, READ BACK BY AND VERIFIED WITH: PHARMD DEVAN MITCHELL [id number] 1542 BY JRS    Klebsiella aerogenes NOT DETECTED NOT DETECTED Final   Klebsiella oxytoca NOT DETECTED NOT DETECTED Final   Klebsiella pneumoniae NOT DETECTED NOT DETECTED Final   Proteus species NOT DETECTED NOT DETECTED Final   Salmonella species NOT DETECTED NOT DETECTED Final   Serratia marcescens NOT DETECTED NOT DETECTED Final   Haemophilus influenzae NOT DETECTED NOT DETECTED Final   Neisseria meningitidis NOT DETECTED NOT DETECTED Final   Pseudomonas aeruginosa NOT DETECTED NOT DETECTED Final   Stenotrophomonas maltophilia NOT DETECTED NOT DETECTED Final   Candida albicans NOT DETECTED NOT DETECTED Final   Candida auris NOT DETECTED NOT DETECTED Final   Candida glabrata NOT DETECTED NOT DETECTED Final   Candida krusei NOT DETECTED NOT DETECTED Final   Candida parapsilosis NOT DETECTED NOT DETECTED Final   Candida tropicalis NOT DETECTED NOT DETECTED Final   Cryptococcus neoformans/gattii NOT DETECTED NOT DETECTED Final   CTX-M ESBL NOT DETECTED NOT DETECTED Final   Carbapenem resistance IMP NOT DETECTED NOT DETECTED Final   Carbapenem resistance KPC NOT DETECTED NOT DETECTED Final   Carbapenem resistance NDM NOT DETECTED NOT DETECTED Final   Carbapenem resist OXA 48 LIKE NOT DETECTED NOT DETECTED Final   Carbapenem resistance VIM NOT DETECTED NOT DETECTED Final    Comment: Performed at [facility], [address], [place], [place] [address]  Culture, blood (Routine X 2) w Reflex to ID Panel     Status: Abnormal   Collection Time: 07/03/22  9:13 PM   Specimen: BLOOD LEFT ARM  Result Value Ref Range Status   Specimen Description   Final    BLOOD LEFT ARM Performed at [facility], [address], [place], [place]  [address]    Special Requests   Final    BOTTLES DRAWN AEROBIC AND ANAEROBIC Blood Culture adequate volume Performed at [facility], [address], [place], [place] [address]    Culture  Setup Time   Final  GRAM NEGATIVE RODS Gram Stain Report Called to,Read Back By and Verified With: SHELTON, A. @[phone]  07/04/2022 BY FRATTO, A. IN BOTH AEROBIC AND ANAEROBIC BOTTLES CRITICAL VALUE NOTED.  VALUE IS CONSISTENT WITH PREVIOUSLY REPORTED AND CALLED VALUE.    Culture (A)  Final    ESCHERICHIA COLI SUSCEPTIBILITIES PERFORMED ON PREVIOUS CULTURE WITHIN THE LAST 5 DAYS. Performed at [facility], [address] [place], [place] [address]    Report Status 07/06/2022 FINAL  Final  MRSA Next Gen by PCR, Nasal     Status: None   Collection Time: 07/03/22  9:18 PM   Specimen: Nasal Mucosa; Nasal Swab  Result Value Ref Range Status   MRSA by PCR Next Gen NOT DETECTED NOT DETECTED Final    Comment: (NOTE) The GeneXpert MRSA Assay (FDA approved for NASAL specimens only), is one component of a comprehensive MRSA colonization surveillance program. It is not intended to diagnose MRSA infection nor to guide or monitor treatment for MRSA infections. Test performance is not FDA approved in patients less than 70 years old. Performed at [facility], [address] [place], [place] [address]   Urine Culture     Status: Abnormal   Collection Time: 07/03/22  9:55 PM   Specimen: Urine, Clean Catch  Result Value Ref Range Status   Specimen Description   Final    URINE, CLEAN CATCH Performed at [facility], [address], [place], [place] [address]    Special Requests   Final    NONE Performed at [facility], [address] [place], [place] [address]    Culture >=100,000 COLONIES/mL ESCHERICHIA COLI (A)  Final   Report Status 07/06/2022 FINAL  Final   Organism ID, Bacteria ESCHERICHIA COLI (A)  Final      Susceptibility   Escherichia coli - MIC*    AMPICILLIN 4 SENSITIVE Sensitive      CEFAZOLIN <=4 SENSITIVE Sensitive     CEFEPIME <=0.12 SENSITIVE Sensitive     CEFTRIAXONE <=0.25 SENSITIVE Sensitive     CIPROFLOXACIN <=0.25 SENSITIVE Sensitive     GENTAMICIN <=1 SENSITIVE Sensitive     IMIPENEM <=0.25 SENSITIVE Sensitive     NITROFURANTOIN <=16 SENSITIVE Sensitive     TRIMETH/SULFA <=20 SENSITIVE Sensitive     AMPICILLIN/SULBACTAM <=2 SENSITIVE Sensitive     PIP/TAZO <=4 SENSITIVE Sensitive     * >=100,000 COLONIES/mL ESCHERICHIA COLI    Labs: CBC: Recent Labs  Lab 07/04/22 0320 07/05/22 0508 07/06/22 0546  WBC 11.4* 9.8 11.1*  NEUTROABS  --  7.6 7.8*  HGB 9.2* 10.1* 9.9*  HCT 27.5* 30.2* 28.7*  MCV 92.3 92.1 89.1  PLT 144* 148* 167   Basic Metabolic Panel: Recent Labs  Lab 07/06/22 0546 07/07/22 0454 07/08/22 0449 07/09/22 0411 07/10/22 0458  NA 133* 132* 133* 134* 133*  K 3.5 3.1* 2.9* 3.7 4.5  CL 100 98 95* 98 98  CO2 24 23 28 26 29   GLUCOSE 113* 113* 127* 172* 139*  BUN 20 23 22  25* 21  CREATININE 0.93 0.95 0.96 0.91 0.84  CALCIUM 8.7* 8.3* 8.5* 8.7* 8.8*  MG 1.4* 1.8 1.5* 1.6* 1.8  PHOS 3.4  --   --   --   --    Liver Function Tests: Recent Labs  Lab 07/05/22 0508 07/06/22 0546  AST 32 26  ALT 16 16  ALKPHOS 59 62  BILITOT 1.3* 0.7  PROT 7.3 6.9  ALBUMIN 2.4* 2.1*   CBG: Recent Labs  Lab 07/09/22 1707  07/09/22 2139 07/10/22 0733 07/10/22 1158 07/10/22 1549  GLUCAP 107* 133* 125* 135* 152*    Discharge time spent: greater than 30 minutes.  Signed: Catarina Hartshorn, MD [facility] 07/10/2022

## 2022-07-10 NOTE — Progress Notes (Signed)
Patient discharged to [facility], transported by EMS. Family requested patient be transported via EMS due to patients condition and weakness. Discharge paperwork placed in discharge packet to give to facility. Belongings sent with patient.

## 2022-07-10 NOTE — Progress Notes (Signed)
Physical Therapy Treatment Patient Details Name: Lisa Thornton MRN: [id number] DOB: [birth year] Today's Date: 07/10/2022   History of Present Illness Lisa Thornton is a 67 y.o. female with medical history significant for   Atria fibrillation, hypertension, diabetes mellitus, breast cancer.  Patient was brought to the ED reports of nausea vomiting over the past 3 days, and generalized weakness.  Went to the urgent care and was referred to the ED.  At the time of my evaluation, patient is a bit confused and denies symptoms previously reported, answers a few questions.  Patient's grand niece-Lisa is at bedside and provides most of the history.  She reports that over the past 3 days, patient has had 1-2 episodes of vomiting daily.  No diarrhea.  No abdominal pain.  Blood sugars were in the 400s yesterday.  Patient denies chest pain or urinary symptoms.  It is unknown if she has been taking her medications over the past few days.  Patient's nurse reported today for in the ED, patient's speech has become a bit confused, like talking about her mother who died in 07/30/2015.    PT Comments    Patient agreeable for therapy.  Patient demonstrates slow labored movement for sitting up at bedside with difficulty propping up on elbows to hands due to weakness, once seated able to completing BLE exercises but has to support self with BUE for maintaining sitting balance.  Patient demonstrates slightly increased endurance/distance for taking side steps, steps forward/backwards before having to sit due to c/o fatigue.  Patient tolerated sitting up in chair after therapy.  Patient will benefit from continued skilled physical therapy in hospital and recommended venue below to increase strength, balance, endurance for safe ADLs and gait.     Recommendations for follow up therapy are one component of a multi-disciplinary discharge planning process, led by the attending physician.  Recommendations may be updated based on patient  status, additional functional criteria and insurance authorization.  Follow Up Recommendations  Can patient physically be transported by private vehicle: Yes    Assistance Recommended at Discharge Set up Supervision/Assistance  Patient can return home with the following A lot of help with bathing/dressing/bathroom;A lot of help with walking and/or transfers;Help with stairs or ramp for entrance;Assistance with cooking/housework   Equipment Recommendations  Rolling walker (2 wheels)    Recommendations for Other Services       Precautions / Restrictions Precautions Precautions: Fall Restrictions Weight Bearing Restrictions: No     Mobility  Bed Mobility Overal bed mobility: Needs Assistance Bed Mobility: Supine to Sit     Supine to sit: Total assist     General bed mobility comments: slow labored movement with diffiuclty completing supine to sitting due to BLE weakness    Transfers Overall transfer level: Needs assistance Equipment used: Rolling walker (2 wheels) Transfers: Sit to/from Stand, Bed to chair/wheelchair/BSC Sit to Stand: Min assist, Mod assist   Step pivot transfers: Min assist, Mod assist       General transfer comment: unsteady labored movement    Ambulation/Gait   Gait Distance (Feet): 12 Feet Assistive device: Rolling walker (2 wheels) Gait Pattern/deviations: Decreased step length - right, Decreased step length - left, Decreased stride length Gait velocity: decreased     General Gait Details: slightly increased endurance for taking side steps and steps forward/backwards before having to sit due to c/o fatigue, BLE weakness   Optometrist  Modified Rankin (Stroke Patients Only)       Balance Overall balance assessment: Needs assistance Sitting-balance support: Feet supported, No upper extremity supported Sitting balance-Leahy Scale: Fair Sitting balance - Comments: fair/good seated at EOB    Standing balance support: During functional activity, Bilateral upper extremity supported Standing balance-Leahy Scale: Fair Standing balance comment: using RW                            Cognition Arousal/Alertness: Awake/alert Behavior During Therapy: WFL for tasks assessed/performed Overall Cognitive Status: Within Functional Limits for tasks assessed                                          Exercises General Exercises - Lower Extremity Long Arc Quad: Seated, AROM, Strengthening, Both, 10 reps Hip Flexion/Marching: Seated, AROM, Strengthening, Both, 10 reps Toe Raises: Seated, AROM, Strengthening, Both, 10 reps Heel Raises: Seated, AROM, Strengthening, Both, 10 reps    General Comments        Pertinent Vitals/Pain Pain Assessment Pain Assessment: No/denies pain    Home Living                          Prior Function            PT Goals (current goals can now be found in the care plan section) Acute Rehab PT Goals Patient Stated Goal: return home with family to assist PT Goal Formulation: With patient Time For Goal Achievement: 07/20/22 Potential to Achieve Goals: Good Progress towards PT goals: Progressing toward goals    Frequency    Min 3X/week      PT Plan Current plan remains appropriate    Co-evaluation              AM-PAC PT "6 Clicks" Mobility   Outcome Measure  Help needed turning from your back to your side while in a flat bed without using bedrails?: A Little Help needed moving from lying on your back to sitting on the side of a flat bed without using bedrails?: A Lot Help needed moving to and from a bed to a chair (including a wheelchair)?: A Lot Help needed standing up from a chair using your arms (e.g., wheelchair or bedside chair)?: A Lot Help needed to walk in hospital room?: A Lot Help needed climbing 3-5 steps with a railing? : A Lot 6 Click Score: 13    End of Session   Activity  Tolerance: Patient tolerated treatment well;Patient limited by fatigue Patient left: in chair;with call bell/phone within reach Nurse Communication: Mobility status PT Visit Diagnosis: Unsteadiness on feet (R26.81);Other abnormalities of gait and mobility (R26.89);Muscle weakness (generalized) (M62.81)     Time: 1110-1130 PT Time Calculation (min) (ACUTE ONLY): 20 min  Charges:  $Therapeutic Exercise: 8-22 mins $Therapeutic Activity: 8-22 mins                     12:11 PM, 07/10/22 Ocie BobJames Elizah Lydon, MPT Physical Therapist with [facility] 336 [phone] office [phone] mobile phone

## 2022-07-10 NOTE — Progress Notes (Signed)
PROGRESS NOTE  Lisa Thornton ZOX:[id number] DOB: 03-20-1956 DOA: 07/03/2022 PCP: Anabel Halon, MD  Brief History:  67 y.o. female with medical history significant for   Atria fibrillation, hypertension, diabetes mellitus, breast cancer. Patient was brought to the ED reports of nausea vomiting over the past 3 days, and generalized weakness.  Went to the urgent care and was referred to the ED. At the time of my evaluation, patient is a bit confused and denies symptoms previously reported, answers a few questions.  Patient's grand niece-Lisa Thornton is at bedside and provides most of the history.  She reports that over the past 3 days, patient has had 1-2 episodes of vomiting daily.  No diarrhea.  No abdominal pain.  Blood sugars were in the 400s yesterday.  Patient denies chest pain or urinary symptoms.  It is unknown if she has been taking her medications over the past few days.  Patient's nurse reported today for in the ED, patient's speech has become a bit confused, like talking about her mother who died in 09/08/15.   ED Course: Tmax 99.9.  Heart rate up to 150s.  Respiratory rate 18- 33.  Blood pressure systolic 102 -135.  Potassium 3.1.  Magnesium 1.1.  Chest x-ray clear.  WBC 17.9.  Creatinine 1.4.  Cardizem drip started with improvement in heart rate.  1 L bolus given.  Magnesium and potassium supplementation started.  Hospitalist to admit for atrial fibrillation with RVR, possible encephalopathy.  Patient was initially started on vancomycin, cefepime, metronidazole.  Blood cultures and urine cultures grew E. coli.  Her antibiotics were de-escalated to ceftriaxone.  Chest x-ray showed right lower lobe opacity.  She was treated for pneumonia.  After fluid resuscitation, the patient became fluid overloaded.  She was started on IV Lasix.  She developed atrial fibrillation with RVR.  She was initially started on diltiazem drip.  This was transitioned to amiodarone secondary to soft blood pressure.    As her sepsis physiology resolved, her atrial fibrillation improved.   Assessment/Plan: Acute respiratory failure with hypoxia -Secondary to pulmonary edema and pneumonia -Initially on BiPAP -Wean oxygen as tolerated -now weaned to 5L>>1.5L>>now on RA   Lobar pneumonia -Personally reviewed chest x-ray--increased interstitial prominence, right lower lobe opacity, left retrocardiac opacity -PCT 69.47 -Continue ceftriaxone   Severe sepsis -Secondary to pneumonia and bacteremia -Present on admission -Meeting severe criteria with tachycardia heart rates up to 150s, ranging from 180-158, tachypnea respirate rate 18-33, leukocytosis of 17.9.  -Lactic acid peaked 2.0 -sepsis physiology resolved   Atrial fibrillation with RVR, type unspecified -d/c diltiazem drip -amiodarone drip started>>rate now controlled -07/06/21--transitioned to po amio -Continue metoprolol -Continue apixaban   Acute HFmrEF -remains clinically fluid overloaded -continue lasix IV>>increased to bid>>switch to po lasix -4/3 echo EF 45-50%, mildly enlarged RV -daily weight  150 lb>>144.4 lb -ReDS vest 24>>18 (07/09/22)   AKI -Secondary to sepsis and hypotension -Baseline creatinine 0.8-1.0 -Presented with serum creatinine 1.40 -improving   E. coli bacteremia -Discontinue vancomycin and cefepime -Continue ceftriaxone   E. coli UTI -UA 11-20 WBC -Continue ceftriaxone   Acute metabolic Encephalopathy -due to infectious process -improving with treating infection -now back to baaseline   Uncontrolled diabetes mellitus type 2 with hyperglycemia -04/26/2022 hemoglobin A1c 7.8 -NovoLog sliding scale   Left breast cancer -Patient follows Dr. Ellin Saba -stage IIIb poorly differentiated left breast cancer, status post left modified radical mastectomy, chemotherapy. -Resume anastrozole   Hypertension -initially on diltiazem  drip>>amio due to soft BPs -Continue metoprolol    Family Communication:  niece  updated 4/6   Consultants:  none   Code Status:  FULL    DVT Prophylaxis: apixaban     Procedures: As Listed in Progress Note Above   Antibiotics: Cefepime 4/1>>4/3 Vanc 4/1 Ceftriaxone 4/3>>          Subjective: Patient denies fevers, chills, headache, chest pain, dyspnea, nausea, vomiting, diarrhea, abdominal pain, dysuria, hematuria, hematochezia, and melena.   Objective: Vitals:   07/09/22 2000 07/10/22 0503 07/10/22 0904 07/10/22 1356  BP: 123/67 119/62 117/62 104/62  Pulse: 89 90 71 84  Resp: Temp: 98.3 F (36.8 C) 97.8 F (36.6 C)  97.7 F (36.5 C)  TempSrc: Oral   Oral  SpO2: 98% 97%  99%  Weight:      Height:        Intake/Output Summary (Last 24 hours) at 07/10/2022 1439 Last data filed at 07/10/2022 0900 Gross per 24 hour  Intake 509.96 ml  Output 850 ml  Net -340.04 ml   Weight change:  Exam:  General:  Pt is alert, follows commands appropriately, not in acute distress HEENT: No icterus, No thrush, No neck mass, [place]/AT Cardiovascular: IRRR, S1/S2, no rubs, no gallops Respiratory: bibasilar rales. No wheeze Abdomen: Soft/+BS, non tender, non distended, no guarding Extremities: Nonpitting LE edema, No lymphangitis, No petechiae, No rashes, no synovitis   Data Reviewed: I have personally reviewed following labs and imaging studies Basic Metabolic Panel: Recent Labs  Lab 07/06/22 0546 07/07/22 0454 07/08/22 0449 07/09/22 0411 07/10/22 0458  NA 133* 132* 133* 134* 133*  K 3.5 3.1* 2.9* 3.7 4.5  CL 100 98 95* 98 98  CO2 GLUCOSE 113* 113* 127* 172* 139*  BUN 25* 21  CREATININE 0.93 0.95 0.96 0.91 0.84  CALCIUM 8.7* 8.3* 8.5* 8.7* 8.8*  MG 1.4* 1.8 1.5* 1.6* 1.8  PHOS 3.4  --   --   --   --    Liver Function Tests: Recent Labs  Lab 07/05/22 0508 07/06/22 0546  AST 32 26  ALT 16 16  ALKPHOS 59 62  BILITOT 1.3* 0.7  PROT 7.3 6.9  ALBUMIN 2.4* 2.1*   No results for input(s): "LIPASE",  "AMYLASE" in the last 168 hours. No results for input(s): "AMMONIA" in the last 168 hours. Coagulation Profile: No results for input(s): "INR", "PROTIME" in the last 168 hours. CBC: Recent Labs  Lab 07/04/22 0320 07/05/22 0508 07/06/22 0546  WBC 11.4* 9.8 11.1*  NEUTROABS  --  7.6 7.8*  HGB 9.2* 10.1* 9.9*  HCT 27.5* 30.2* 28.7*  MCV 92.3 92.1 89.1  PLT 144* 148* 167   Cardiac Enzymes: No results for input(s): "CKTOTAL", "CKMB", "CKMBINDEX", "TROPONINI" in the last 168 hours. BNP: Invalid input(s): "POCBNP" CBG: Recent Labs  Lab 07/09/22 1143 07/09/22 1707 07/09/22 2139 07/10/22 0733 07/10/22 1158  GLUCAP 181* 107* 133* 125* 135*   HbA1C: No results for input(s): "HGBA1C" in the last 72 hours. Urine analysis:    Component Value Date/Time   COLORURINE YELLOW 07/03/2022 2057   APPEARANCEUR HAZY (A) 07/03/2022 2057   APPEARANCEUR Turbid (A) 04/26/2022 1413   LABSPEC 1.016 07/03/2022 2057   PHURINE 5.0 07/03/2022 2057   GLUCOSEU NEGATIVE 07/03/2022 2057   HGBUR MODERATE (A) 07/03/2022 2057   BILIRUBINUR NEGATIVE 07/03/2022 2057   BILIRUBINUR CANCELED 04/26/2022 1413   KETONESUR NEGATIVE 07/03/2022 2057  PROTEINUR 30 (A) 07/03/2022 2057   UROBILINOGEN 0.2 11/14/2021 1443   NITRITE POSITIVE (A) 07/03/2022 2057   LEUKOCYTESUR SMALL (A) 07/03/2022 2057   Sepsis Labs: @LABRCNTIP (procalcitonin:4,lacticidven:4) ) Recent Results (from the past 240 hour(s))  Resp panel by RT-PCR (RSV, Flu A&B, Covid) Anterior Nasal Swab     Status: None   Collection Time: 07/03/22  6:56 PM   Specimen: Anterior Nasal Swab  Result Value Ref Range Status   SARS Coronavirus 2 by RT PCR NEGATIVE NEGATIVE Final    Comment: (NOTE) SARS-CoV-2 target nucleic acids are NOT DETECTED.  The SARS-CoV-2 RNA is generally detectable in upper respiratory specimens during the acute phase of infection. The lowest concentration of SARS-CoV-2 viral copies this assay can detect is 138 copies/mL. A  negative result does not preclude SARS-Cov-2 infection and should not be used as the sole basis for treatment or other patient management decisions. A negative result may occur with  improper specimen collection/handling, submission of specimen other than nasopharyngeal swab, presence of viral mutation(s) within the areas targeted by this assay, and inadequate number of viral copies(<138 copies/mL). A negative result must be combined with clinical observations, patient history, and epidemiological information. The expected result is Negative.  Fact Sheet for Patients:  BloggerCourse.com  Fact Sheet for Healthcare Providers:  SeriousBroker.it  This test is no t yet approved or cleared by the [place] FDA and  has been authorized for detection and/or diagnosis of SARS-CoV-2 by FDA under an Emergency Use Authorization (EUA). This EUA will remain  in effect (meaning this test can be used) for the duration of the COVID-19 declaration under Section 564(b)(1) of the Act, 21 U.S.C.section 360bbb-3(b)(1), unless the authorization is terminated  or revoked sooner.       Influenza A by PCR NEGATIVE NEGATIVE Final   Influenza B by PCR NEGATIVE NEGATIVE Final    Comment: (NOTE) The Xpert Xpress SARS-CoV-2/FLU/RSV plus assay is intended as an aid in the diagnosis of influenza from Nasopharyngeal swab specimens and should not be used as a sole basis for treatment. Nasal washings and aspirates are unacceptable for Xpert Xpress SARS-CoV-2/FLU/RSV testing.  Fact Sheet for Patients: BloggerCourse.com  Fact Sheet for Healthcare Providers: SeriousBroker.it  This test is not yet approved or cleared by the [place] FDA and has been authorized for detection and/or diagnosis of SARS-CoV-2 by FDA under an Emergency Use Authorization (EUA). This EUA will remain in effect (meaning this test can  be used) for the duration of the COVID-19 declaration under Section 564(b)(1) of the Act, 21 U.S.C. section 360bbb-3(b)(1), unless the authorization is terminated or revoked.     Resp Syncytial Virus by PCR NEGATIVE NEGATIVE Final    Comment: (NOTE) Fact Sheet for Patients: BloggerCourse.com  Fact Sheet for Healthcare Providers: SeriousBroker.it  This test is not yet approved or cleared by the [place] FDA and has been authorized for detection and/or diagnosis of SARS-CoV-2 by FDA under an Emergency Use Authorization (EUA). This EUA will remain in effect (meaning this test can be used) for the duration of the COVID-19 declaration under Section 564(b)(1) of the Act, 21 U.S.C. section 360bbb-3(b)(1), unless the authorization is terminated or revoked.  Performed at [facility], [address], [place], [place] [address]   Culture, blood (Routine X 2) w Reflex to ID Panel     Status: Abnormal   Collection Time: 07/03/22  9:11 PM   Specimen: BLOOD RIGHT HAND  Result Value Ref Range Status   Specimen Description  Final    BLOOD RIGHT HAND Performed at [facility], [address], [place], [place] [address]    Special Requests   Final    BOTTLES DRAWN AEROBIC AND ANAEROBIC Blood Culture adequate volume Performed at [facility], [address] [place], [place] [address]    Culture  Setup Time   Final    GRAM NEGATIVE RODS Gram Stain Report Called to,Read Back By and Verified With: SHELTON, A. @ [phone] 07/04/2022 BY FRATTO, A. IN BOTH AEROBIC AND ANAEROBIC BOTTLES CRITICAL RESULT CALLED TO, READ BACK BY AND VERIFIED WITH: PHARMD Drusilla Kanner [id number] BY JRS Performed at [facility], [address], [place], [place] [address]    Culture ESCHERICHIA COLI (A)  Final   Report Status 07/06/2022 FINAL  Final   Organism ID, Bacteria ESCHERICHIA COLI  Final      Susceptibility   Escherichia coli - MIC*     AMPICILLIN 4 SENSITIVE Sensitive     CEFEPIME <=0.12 SENSITIVE Sensitive     CEFTAZIDIME <=1 SENSITIVE Sensitive     CEFTRIAXONE <=0.25 SENSITIVE Sensitive     CIPROFLOXACIN <=0.25 SENSITIVE Sensitive     GENTAMICIN <=1 SENSITIVE Sensitive     IMIPENEM <=0.25 SENSITIVE Sensitive     TRIMETH/SULFA <=20 SENSITIVE Sensitive     AMPICILLIN/SULBACTAM <=2 SENSITIVE Sensitive     PIP/TAZO <=4 SENSITIVE Sensitive     * ESCHERICHIA COLI  Blood Culture ID Panel (Reflexed)     Status: Abnormal   Collection Time: 07/03/22  9:11 PM  Result Value Ref Range Status   Enterococcus faecalis NOT DETECTED NOT DETECTED Final   Enterococcus Faecium NOT DETECTED NOT DETECTED Final   Listeria monocytogenes NOT DETECTED NOT DETECTED Final   Staphylococcus species NOT DETECTED NOT DETECTED Final   Staphylococcus aureus (BCID) NOT DETECTED NOT DETECTED Final   Staphylococcus epidermidis NOT DETECTED NOT DETECTED Final   Staphylococcus lugdunensis NOT DETECTED NOT DETECTED Final   Streptococcus species NOT DETECTED NOT DETECTED Final   Streptococcus agalactiae NOT DETECTED NOT DETECTED Final   Streptococcus pneumoniae NOT DETECTED NOT DETECTED Final   Streptococcus pyogenes NOT DETECTED NOT DETECTED Final   A.calcoaceticus-baumannii NOT DETECTED NOT DETECTED Final   Bacteroides fragilis NOT DETECTED NOT DETECTED Final   Enterobacterales DETECTED (A) NOT DETECTED Final    Comment: Enterobacterales represent a large order of gram negative bacteria, not a single organism. CRITICAL RESULT CALLED TO, READ BACK BY AND VERIFIED WITH: PHARMD DEVAN MITCHELL [phone] [id number] BY JRS    Enterobacter cloacae complex NOT DETECTED NOT DETECTED Final   Escherichia coli DETECTED (A) NOT DETECTED Final    Comment: CRITICAL RESULT CALLED TO, READ BACK BY AND VERIFIED WITH: PHARMD DEVAN MITCHELL [phone] [id number] BY JRS    Klebsiella aerogenes NOT DETECTED NOT DETECTED Final   Klebsiella oxytoca NOT DETECTED NOT DETECTED Final    Klebsiella pneumoniae NOT DETECTED NOT DETECTED Final   Proteus species NOT DETECTED NOT DETECTED Final   Salmonella species NOT DETECTED NOT DETECTED Final   Serratia marcescens NOT DETECTED NOT DETECTED Final   Haemophilus influenzae NOT DETECTED NOT DETECTED Final   Neisseria meningitidis NOT DETECTED NOT DETECTED Final   Pseudomonas aeruginosa NOT DETECTED NOT DETECTED Final   Stenotrophomonas maltophilia NOT DETECTED NOT DETECTED Final   Candida albicans NOT DETECTED NOT DETECTED Final   Candida auris NOT DETECTED NOT DETECTED Final   Candida glabrata NOT DETECTED NOT DETECTED Final   Candida krusei NOT DETECTED NOT DETECTED Final  Candida parapsilosis NOT DETECTED NOT DETECTED Final   Candida tropicalis NOT DETECTED NOT DETECTED Final   Cryptococcus neoformans/gattii NOT DETECTED NOT DETECTED Final   CTX-M ESBL NOT DETECTED NOT DETECTED Final   Carbapenem resistance IMP NOT DETECTED NOT DETECTED Final   Carbapenem resistance KPC NOT DETECTED NOT DETECTED Final   Carbapenem resistance NDM NOT DETECTED NOT DETECTED Final   Carbapenem resist OXA 48 LIKE NOT DETECTED NOT DETECTED Final   Carbapenem resistance VIM NOT DETECTED NOT DETECTED Final    Comment: Performed at [facility], [address] [place], [place] [address]  Culture, blood (Routine X 2) w Reflex to ID Panel     Status: Abnormal   Collection Time: 07/03/22  9:13 PM   Specimen: BLOOD LEFT ARM  Result Value Ref Range Status   Specimen Description   Final    BLOOD LEFT ARM Performed at [facility], [address], [place], [place] [address]    Special Requests   Final    BOTTLES DRAWN AEROBIC AND ANAEROBIC Blood Culture adequate volume Performed at [facility], [address], [place], [place] [address]    Culture  Setup Time   Final    GRAM NEGATIVE RODS Gram Stain Report Called to,Read Back By and Verified With: SHELTON, A. @[phone]  07/04/2022 BY FRATTO, A. IN BOTH AEROBIC AND ANAEROBIC  BOTTLES CRITICAL VALUE NOTED.  VALUE IS CONSISTENT WITH PREVIOUSLY REPORTED AND CALLED VALUE.    Culture (A)  Final    ESCHERICHIA COLI SUSCEPTIBILITIES PERFORMED ON PREVIOUS CULTURE WITHIN THE LAST 5 DAYS. Performed at [facility], [address], [place], [place] [address]    Report Status 07/06/2022 FINAL  Final  MRSA Next Gen by PCR, Nasal     Status: None   Collection Time: 07/03/22  9:18 PM   Specimen: Nasal Mucosa; Nasal Swab  Result Value Ref Range Status   MRSA by PCR Next Gen NOT DETECTED NOT DETECTED Final    Comment: (NOTE) The GeneXpert MRSA Assay (FDA approved for NASAL specimens only), is one component of a comprehensive MRSA colonization surveillance program. It is not intended to diagnose MRSA infection nor to guide or monitor treatment for MRSA infections. Test performance is not FDA approved in patients less than 66 years old. Performed at [facility], [address] [place], [place] [address]   Urine Culture     Status: Abnormal   Collection Time: 07/03/22  9:55 PM   Specimen: Urine, Clean Catch  Result Value Ref Range Status   Specimen Description   Final    URINE, CLEAN CATCH Performed at [facility], [address] [place], [place] [address]    Special Requests   Final    NONE Performed at [facility], [address] [place], [place] [address]    Culture >=100,000 COLONIES/mL ESCHERICHIA COLI (A)  Final   Report Status 07/06/2022 FINAL  Final   Organism ID, Bacteria ESCHERICHIA COLI (A)  Final      Susceptibility   Escherichia coli - MIC*    AMPICILLIN 4 SENSITIVE Sensitive     CEFAZOLIN <=4 SENSITIVE Sensitive     CEFEPIME <=0.12 SENSITIVE Sensitive     CEFTRIAXONE <=0.25 SENSITIVE Sensitive     CIPROFLOXACIN <=0.25 SENSITIVE Sensitive     GENTAMICIN <=1 SENSITIVE Sensitive     IMIPENEM <=0.25 SENSITIVE Sensitive     NITROFURANTOIN <=16 SENSITIVE Sensitive     TRIMETH/SULFA <=20 SENSITIVE Sensitive     AMPICILLIN/SULBACTAM <=  2  SENSITIVE Sensitive     PIP/TAZO <=4 SENSITIVE Sensitive     * >=100,000 COLONIES/mL ESCHERICHIA COLI     Scheduled Meds:  amiodarone  200 mg Oral BID   anastrozole  1 mg Oral Daily   apixaban  5 mg Oral BID   Chlorhexidine Gluconate Cloth  6 each Topical Daily   furosemide  40 mg Oral Daily   insulin aspart  0-5 Units Subcutaneous QHS   insulin aspart  0-9 Units Subcutaneous TID WC   magnesium oxide  400 mg Oral BID   metoprolol tartrate  50 mg Oral BID   mouth rinse  15 mL Mouth Rinse 4 times per day   Continuous Infusions:  cefTRIAXone (ROCEPHIN)  IV 2 g (07/10/22 0837)    Procedures/Studies: DG Pelvis 1-2 Views  Result Date: 07/06/2022 CLINICAL DATA:  Pain EXAM: PELVIS - 1 VIEW COMPARISON:  None Available. FINDINGS: There is no evidence of pelvic fracture or diastasis. No pelvic bone lesions are seen. IMPRESSION: Negative. Electronically Signed   By: Layla Maw M.D.   On: 07/06/2022 10:27   DG FEMUR PORT, MIN 2 VIEWS RIGHT  Result Date: 07/06/2022 CLINICAL DATA:  Fall EXAM: RIGHT FEMUR PORTABLE 2 VIEW COMPARISON:  None Available. FINDINGS: There is no evidence of fracture or other focal bone lesions. Soft tissues are unremarkable. IMPRESSION: Negative. Electronically Signed   By: Layla Maw M.D.   On: 07/06/2022 10:17   [place] EKG SITE RITE  Result Date: 07/05/2022 If Site Rite image not attached, placement could not be confirmed due to current cardiac rhythm.  ECHOCARDIOGRAM COMPLETE  Result Date: 07/05/2022    ECHOCARDIOGRAM REPORT   Patient Name:   KRYSTIANNA SOTH Date of Exam: 07/05/2022 Medical Rec #:  [id number]    Height:       64.0 in Accession #:    [id number]   Weight:       147.9 lb Date of Birth:  Nov 30, 1955   BSA:          1.721 m Patient Age:    66 years     BP:           124/53 mmHg Patient Gender: F            HR:           117 bpm. [facility] Procedure: 2D Echo, Cardiac Doppler and Color Doppler Indications:    CHF-Acute Diastolic I50.31   History:        Patient has prior history of Echocardiogram examinations, most                 recent 01/13/2021. Sepsis, Arrythmias:Atrial Fibrillation,                 Signs/Symptoms:Chest Pain; Risk Factors:Hypertension and                 Diabetes.  Sonographer:    Aron Baba Referring Phys: [phone] Jc Veron  Sonographer Comments: Image acquisition challenging due to patient body habitus and Image acquisition challenging due to respiratory motion. IMPRESSIONS  1. Challenging study for LVEF assessment due to tachycardia. Left ventricular ejection fraction, by estimation, is 45 to 50%. The left ventricle has mildly decreased function. Left ventricular endocardial border not optimally defined to evaluate regional wall motion. Left ventricular diastolic function could not be evaluated due to atrial fibrillation.  2. Right ventricular systolic function was not well visualized. The right ventricular size is mildly enlarged.  3. Left atrial size was moderately dilated.  4. The mitral valve is grossly normal. Trivial mitral valve regurgitation. No evidence of mitral stenosis.  5. The aortic valve is tricuspid. There is mild calcification of the aortic valve. Aortic valve regurgitation is not visualized. No aortic stenosis is present. Comparison(s): Changes from prior study are noted. LVEF worsened from 55% in 2020 to 45-50% now. FINDINGS  Left Ventricle: Left ventricular ejection fraction, by estimation, is 45 to 50%. The left ventricle has mildly decreased function. Left ventricular endocardial border not optimally defined to evaluate regional wall motion. The left ventricular internal cavity size was normal in size. There is no left ventricular hypertrophy. Left ventricular diastolic function could not be evaluated due to atrial fibrillation. Left ventricular diastolic function could not be evaluated. Right Ventricle: The right ventricular size is mildly enlarged. No increase in right ventricular wall thickness. Right  ventricular systolic function was not well visualized. Left Atrium: Left atrial size was moderately dilated. Right Atrium: Right atrial size was normal in size. Pericardium: There is no evidence of pericardial effusion. Mitral Valve: The mitral valve is grossly normal. Trivial mitral valve regurgitation. No evidence of mitral valve stenosis. Tricuspid Valve: The tricuspid valve is grossly normal. Tricuspid valve regurgitation is mild . No evidence of tricuspid stenosis. Aortic Valve: The aortic valve is tricuspid. There is mild calcification of the aortic valve. Aortic valve regurgitation is not visualized. No aortic stenosis is present. Pulmonic Valve: The pulmonic valve was not well visualized. Pulmonic valve regurgitation is not visualized. No evidence of pulmonic stenosis. Aorta: The aortic root and ascending aorta are structurally normal, with no evidence of dilitation. Venous: The inferior vena cava was not well visualized. IAS/Shunts: The interatrial septum was not well visualized.  LEFT VENTRICLE PLAX 2D LVIDd:         4.20 cm   Diastology LVIDs:         3.20 cm   LV e' medial:    9.25 cm/s LV PW:         1.10 cm   LV E/e' medial:  14.6 LV IVS:        0.70 cm   LV e' lateral:   10.10 cm/s LVOT diam:     1.80 cm   LV E/e' lateral: 13.4 LV SV:         43 LV SV Index:   25 LVOT Area:     2.54 cm  RIGHT VENTRICLE RV S prime:     11.30 cm/s TAPSE (M-mode): 1.7 cm LEFT ATRIUM           Index        RIGHT ATRIUM           Index LA diam:      3.30 cm 1.92 cm/m   RA Area:     16.70 cm LA Vol (A2C): 25.5 ml 14.82 ml/m  RA Volume:   46.90 ml  27.25 ml/m LA Vol (A4C): 76.6 ml 44.51 ml/m  AORTIC VALVE LVOT Vmax:   82.50 cm/s LVOT Vmean:  53.300 cm/s LVOT VTI:    0.170 m  AORTA Ao Root diam: 2.80 cm Ao Asc diam:  3.10 cm MITRAL VALVE                TRICUSPID VALVE MV Area (PHT): 5.54 cm     TR Peak grad:   27.2 mmHg MV Decel Time: 137 msec     TR Vmax:        261.00  cm/s MR Peak grad: 53.4 mmHg MR Vmax:      365.50  cm/s   SHUNTS MV E velocity: 135.00 cm/s  Systemic VTI:  0.17 m                             Systemic Diam: 1.80 cm Vishnu Priya Mallipeddi Electronically signed by Winfield Rast Mallipeddi Signature Date/Time: 07/05/2022/3:45:55 PM    Final    DG CHEST PORT 1 VIEW  Result Date: 07/05/2022 CLINICAL DATA:  Shortness of breath EXAM: PORTABLE CHEST 1 VIEW COMPARISON:  Yesterday FINDINGS: Cardiomegaly. Interstitial opacity with streaky density in the lower lungs. Small right pleural effusion. No pneumothorax. Porta catheter with tip at the SVC. IMPRESSION: Interstitial and airspace opacity at the bases that could be infection and/or edema. Aeration is worsened from yesterday. Electronically Signed   By: Tiburcio Pea M.D.   On: 07/05/2022 07:56   MM 3D SCREEN BREAST UNI RIGHT  Result Date: 07/04/2022 CLINICAL DATA:  Screening. History left mastectomy 2019. EXAM: DIGITAL SCREENING UNILATERAL RIGHT MAMMOGRAM WITH CAD AND TOMOSYNTHESIS TECHNIQUE: Right screening digital craniocaudal and mediolateral oblique mammograms were obtained. Right screening digital breast tomosynthesis was performed. The images were evaluated with computer-aided detection. COMPARISON:  Previous exam(s). ACR Breast Density Category b: There are scattered areas of fibroglandular density. FINDINGS: There are no findings suspicious for malignancy. Note that a request was initially made to repeat the right CC to obtain more posterior tissue however the patient is currently hospitalized and located in the intensive care unit and therefore would not be able to obtain any additional mammography any time soon. IMPRESSION: No mammographic evidence of malignancy. A result letter of this screening mammogram will be mailed directly to the patient. RECOMMENDATION: Screening mammogram in one year. (Code:[id number]) BI-RADS CATEGORY  1: Negative. Electronically Signed   By: Edwin Cap M.D.   On: 07/04/2022 15:57   DG Chest Port 1 View  Result Date:  07/04/2022 CLINICAL DATA:  67 year old female with history of dyspnea. EXAM: PORTABLE CHEST 1 VIEW COMPARISON:  Chest x-ray 07/03/2022. FINDINGS: Right subclavian single-lumen power porta cath with tip terminating in the distal superior vena cava. Areas of interstitial prominence and peribronchial cuffing, most evident in the mid to lower lungs bilaterally. Focal opacity in the retrocardiac region on the left likely reflects atelectasis, although developing airspace consolidation is not excluded. No pleural effusions. No pneumothorax. No evidence of pulmonary edema. Heart size is normal. Upper mediastinal contours are within normal limits. IMPRESSION: 1. The appearance of the chest may suggest an acute bronchitis. 2. There is also a retrocardiac opacity in the medial left base which may reflect an area of atelectasis or developing airspace consolidation. Followup PA and lateral chest X-ray is recommended in 3-4 weeks following trial of antibiotic therapy to ensure resolution and exclude underlying malignancy. Electronically Signed   By: Trudie Reed M.D.   On: 07/04/2022 05:45   CT Head Wo Contrast  Result Date: 07/03/2022 CLINICAL DATA:  Nausea and vomiting for 3 days, altered level of consciousness EXAM: CT HEAD WITHOUT CONTRAST TECHNIQUE: Contiguous axial images were obtained from the base of the skull through the vertex without intravenous contrast. RADIATION DOSE REDUCTION: This exam was performed according to the departmental dose-optimization program which includes automated exposure control, adjustment of the mA and/or kV according to patient size and/or use of iterative reconstruction technique. COMPARISON:  None Available. FINDINGS: Brain: Hypodensity in the left occipital periventricular and subcortical  white matter consistent with age indeterminate ischemic change. No other signs of acute infarct or hemorrhage. Lateral ventricles and midline structures are unremarkable. There are no acute  extra-axial fluid collections. No mass effect. Vascular: Atherosclerosis of the internal carotid arteries. No hyperdense vessel. Skull: Normal. Negative for fracture or focal lesion. Sinuses/Orbits: Prominent mucoperiosteal thickening of the bilateral maxillary sinuses, with a small superimposed gas fluid level on the left. Minimal retained secretions within the sphenoid sinus. Other: None. IMPRESSION: 1. Age indeterminate ischemic changes within the left occipital periventricular and subcortical white matter, favor chronic. 2. Otherwise no acute intracranial process. 3. Bilateral maxillary sinus disease, with evidence of superimposed acute left maxillary sinusitis. Electronically Signed   By: Sharlet SalinaMichael  Brown M.D.   On: 07/03/2022 19:43   DG Chest 2 View  Result Date: 07/03/2022 CLINICAL DATA:  AFib EXAM: CHEST - 2 VIEW COMPARISON:  CXR 06/27/17 FINDINGS: Right-sided chest port in place with the tip in the upper SVC, unchanged from prior exam. No pleural effusion. No pneumothorax. No focal airspace opacity. Normal cardiac and mediastinal contours. No radiographically apparent displaced rib fractures. Visualized upper abdomen is unremarkable. Vertebral body heights are maintained. IMPRESSION: No focal airspace opacity. Electronically Signed   By: Lorenza CambridgeHemant  Desai M.D.   On: 07/03/2022 14:44   DG Bone Density  Result Date: 06/26/2022 EXAM: DUAL X-RAY ABSORPTIOMETRY (DXA) FOR BONE MINERAL DENSITY IMPRESSION: Your patient Bishop Dublineresa Kissel completed a BMD test on 06/26/2022 using the [employer] DXA System (software version: 14.10) manufactured by [employer] LUNAR. The following summarizes the results of our evaluation. Technologist: [facility] PATIENT BIOGRAPHICAL: Name: Tobin ChadGay, Destynee D Patient ID: [id number] Birth Date: 1955-06-21 Height: 64.0 in. Gender: Female Exam Date: 06/26/2022 Weight: 151.0 lbs. Indications: Hx Breast Ca, Caucasian, Secondary Osteoporosis, Follow up Osteopenia, Post Menopausal Fractures: Treatments:  Calcium, Anastrozole, Vitamin D DENSITOMETRY RESULTS: Site         Region     Measured Date Measured Age WHO Classification Young Adult T-score BMD         %Change vs. Previous Significant Change (*) DualFemur Neck Left 06/26/2022 68.3 Osteopenia -1.9 0.773 g/cm2 -5.3% Yes DualFemur Neck Left 06/18/2020 66.2 Osteopenia -1.6 0.816 g/cm2 2.6% - DualFemur Neck Left 03/07/2018 64.0 Osteopenia -1.8 0.795 g/cm2 - - DualFemur Total Mean 06/26/2022 68.3 Osteopenia -1.3 0.838 g/cm2 0.0% - DualFemur Total Mean 06/18/2020 66.2 Osteopenia -1.3 0.838 g/cm2 -2.9% Yes DualFemur Total Mean 03/07/2018 64.0 - - 0.863 g/cm2 - - Left Forearm Radius 33% 06/26/2022 68.3 Osteopenia -1.4 0.616 g/cm2 -7.8% Yes Left Forearm Radius 33% 06/18/2020 66.2 Normal -0.6 0.668 g/cm2 -9.6% Yes Left Forearm Radius 33% 03/07/2018 64.0 Normal 0.4 0.739 g/cm2 - - ASSESSMENT: The BMD measured at Femur Neck Left is 0.773 g/cm2 with a T-score of -1.9. This patient is considered osteopenic according to World Health Organization [facility](WHO) criteria. The scan quality is good. Compared with the prior study on 06/18/20, the BMD of the total mean shows no statistically significant change, however, the lt. forearm shows a significant decrease. Lumbar spine was excluded due to advanced degenerative changes. World Science writerHealth Organization [facility](WHO) criteria for post-menopausal, Caucasian Women: Normal:       T-score at or above -1 SD Osteopenia:   T-score between -1 and -2.5 SD Osteoporosis: T-score at or below -2.5 SD RECOMMENDATIONS: 1. All patients should optimize calcium and vitamin D intake. 2. Consider FDA-approved medical therapies in postmenopausal women and med aged 950 years and older, based on the following: a. A hip or vertebral (clinical or  morphometric) fracture b. T-score< -2.5 at the femoral neck or spine after appropriate evaluation to exclude secondary causes c. Low bone mass (T-score between -1.0 and -2.5 at the femoral neck or spine) and a 10-year probability of  a hip fracture > 3% or a 10-year probability of a major osteoporosis-related fracture > 20% based on the [place]-adapted WHO algorithm d. Clinician judgment and/or patient preferences may indicate treatment for people with 10-year fracture probabilities above or below these levels FOLLOW-UP: Patients with diagnosis of osteoporsis or at high risk for fracture should have regular bone mineral density tests. For patients eligible for Medicare, routine testing is allowed once every 2 years. The testing frequency can be increased to one year for patients who have rapidly progressing disease, those who are receiving or discontinuing medical therapy to restore bone mass, or have additional risk factors. I have reviewed this report, and agree with the above findings. [facility] Your patient KIYONO HOYE completed a FRAX assessment on 06/26/2022 using the [employer] DXA System (analysis version: 14.10) manufactured by [employer]. The following summarizes the results of our evaluation. PATIENT BIOGRAPHICAL: Name: Jaycelynn, Deichmann Patient ID: [id number] Birth Date: 28-Feb-1956 Height:    64.0 in. Gender:     [age]    [age]       Weight:    151.0 lbs. Ethnicity:  White                            Exam Date: 06/26/2022 FRAX* RESULTS:  (version: 3.5) 10-year Probability of Fracture1 Major Osteoporotic Fracture2 Hip Fracture 11.3% 1.9% Population: [place] (Caucasian) Risk Factors: Secondary Osteoporosis Based on DualFemur (Left) Neck BMD 1 -The 10-year probability of fracture may be lower than reported if the patient has received treatment. 2 -Major Osteoporotic Fracture: Clinical Spine, Forearm, Hip or Shoulder *FRAX is a Armed forces logistics/support/administrative officer of the [employer] of [employer] for Metabolic Bone Disease, a World Science writer (WHO) [employer]. ASSESSMENT: The probability of a major osteoporotic fracture is 11.3% within the next ten years. The probability of a hip fracture is 1.9% within  the next ten years. Electronically Signed   By: Gerome Sam III M.D.   On: 06/26/2022 09:39    Catarina Hartshorn, DO  [facility]  If 7PM-7AM, please contact night-coverage [email or web address] Password [facility] 07/10/2022, 2:39 PM   LOS: 7 days

## 2022-07-10 NOTE — Plan of Care (Signed)
Patient AOX4, VSS throughout shift.  All meds given on time as ordered.  Denied pain and SOB.  Purewick in place.  Diminished lungs, IS encouraged.  POC maintained, will continue to monitor.  Problem: Education: Goal: Knowledge of General Education information will improve Description: Including pain rating scale, medication(s)/side effects and non-pharmacologic comfort measures Outcome: Progressing   Problem: Health Behavior/Discharge Planning: Goal: Ability to manage health-related needs will improve Outcome: Progressing   Problem: Clinical Measurements: Goal: Ability to maintain clinical measurements within normal limits will improve Outcome: Progressing Goal: Will remain free from infection Outcome: Progressing Goal: Diagnostic test results will improve Outcome: Progressing Goal: Respiratory complications will improve Outcome: Progressing Goal: Cardiovascular complication will be avoided Outcome: Progressing   Problem: Activity: Goal: Risk for activity intolerance will decrease Outcome: Progressing   Problem: Nutrition: Goal: Adequate nutrition will be maintained Outcome: Progressing   Problem: Coping: Goal: Level of anxiety will decrease Outcome: Progressing   Problem: Elimination: Goal: Will not experience complications related to bowel motility Outcome: Progressing Goal: Will not experience complications related to urinary retention Outcome: Progressing   Problem: Pain Managment: Goal: General experience of comfort will improve Outcome: Progressing   Problem: Safety: Goal: Ability to remain free from injury will improve Outcome: Progressing   Problem: Skin Integrity: Goal: Risk for impaired skin integrity will decrease Outcome: Progressing   Problem: Education: Goal: Ability to describe self-care measures that may prevent or decrease complications (Diabetes Survival Skills Education) will improve Outcome: Progressing Goal: Individualized Educational  Video(s) Outcome: Progressing   Problem: Coping: Goal: Ability to adjust to condition or change in health will improve Outcome: Progressing   Problem: Fluid Volume: Goal: Ability to maintain a balanced intake and output will improve Outcome: Progressing   Problem: Health Behavior/Discharge Planning: Goal: Ability to identify and utilize available resources and services will improve Outcome: Progressing Goal: Ability to manage health-related needs will improve Outcome: Progressing   Problem: Metabolic: Goal: Ability to maintain appropriate glucose levels will improve Outcome: Progressing   Problem: Nutritional: Goal: Maintenance of adequate nutrition will improve Outcome: Progressing Goal: Progress toward achieving an optimal weight will improve Outcome: Progressing   Problem: Skin Integrity: Goal: Risk for impaired skin integrity will decrease Outcome: Progressing   Problem: Tissue Perfusion: Goal: Adequacy of tissue perfusion will improve Outcome: Progressing

## 2022-07-10 NOTE — Progress Notes (Signed)
Rounding Note    Patient Name: Lisa Thornton Date of Encounter: 07/10/2022  [facility] Cardiologist: Marjo Bicker, MD   Subjective   No complaints  Inpatient Medications    Scheduled Meds:  amiodarone  200 mg Oral BID   anastrozole  1 mg Oral Daily   apixaban  5 mg Oral BID   Chlorhexidine Gluconate Cloth  6 each Topical Daily   furosemide  40 mg Oral Daily   insulin aspart  0-5 Units Subcutaneous QHS   insulin aspart  0-9 Units Subcutaneous TID WC   magnesium oxide  400 mg Oral BID   metoprolol tartrate  50 mg Oral BID   mouth rinse  15 mL Mouth Rinse 4 times per day   potassium chloride  40 mEq Oral BID   Continuous Infusions:  cefTRIAXone (ROCEPHIN)  IV Stopped (07/09/22 0840)   PRN Meds: acetaminophen **OR** acetaminophen, ondansetron **OR** ondansetron (ZOFRAN) IV, mouth rinse, oxyCODONE-acetaminophen, polyethylene glycol   Vital Signs    Vitals:   07/09/22 1500 07/09/22 1537 07/09/22 2000 07/10/22 0503  BP: (!) 115/52  123/67 119/62  Pulse: 76 78 89 90  Resp: 14 15 16 18   Temp:   98.3 F (36.8 C) 97.8 F (36.6 C)  TempSrc:   Oral   SpO2: 97% 98% 98% 97%  Weight:      Height:        Intake/Output Summary (Last 24 hours) at 07/10/2022 0813 Last data filed at 07/10/2022 0524 Gross per 24 hour  Intake 482.5 ml  Output 1525 ml  Net -1042.5 ml      07/08/2022    5:00 AM 07/07/2022    5:59 AM 07/03/2022   10:06 PM  Last 3 Weights  Weight (lbs) 144 lb 6.4 oz 150 lb 2.1 oz 147 lb 14.9 oz  Weight (kg) 65.5 kg 68.1 kg 67.1 kg      Telemetry    Rate controlled afib - Personally Reviewed  ECG     - Personally Reviewed  Physical Exam   GEN: No acute distress.   Neck: No JVD Cardiac: irreg, no m/r/g no jvd Respiratory: Clear to auscultation bilaterally. GI: Soft, nontender, non-distended  MS: No edema; No deformity. Neuro:  Nonfocal  Psych: Normal affect   Labs    High Sensitivity Troponin:   Recent Labs  Lab 07/03/22 1410  07/03/22 1600  TROPONINIHS 11 11     Chemistry Recent Labs  Lab 07/03/22 1410 07/04/22 0320 07/05/22 0508 07/06/22 0546 07/07/22 0454 07/08/22 0449 07/09/22 0411 07/10/22 0458  NA 131*   < > 135 133*   < > 133* 134* 133*  K 3.1*   < > 3.8 3.5   < > 2.9* 3.7 4.5  CL 93*   < > 106 100   < > 95* 98 98  CO2 24   < > 19* 24   < > 28 26 29   GLUCOSE 193*   < > 133* 113*   < > 127* 172* 139*  BUN 35*   < > 24* 20   < > 22 25* 21  CREATININE 1.40*   < > 1.03* 0.93   < > 0.96 0.91 0.84  CALCIUM 8.9   < > 8.4* 8.7*   < > 8.5* 8.7* 8.8*  MG 1.1*   < >  --  1.4*   < > 1.5* 1.6* 1.8  PROT 8.1  --  7.3 6.9  --   --   --   --  ALBUMIN 2.9*  --  2.4* 2.1*  --   --   --   --   AST 45*  --  32 26  --   --   --   --   ALT 22  --  16 16  --   --   --   --   ALKPHOS 51  --  59 62  --   --   --   --   BILITOT 0.9  --  1.3* 0.7  --   --   --   --   GFRNONAA 41*   < > 59* >60   < > >60 >60 >60  ANIONGAP 14   < > 10 9   < > 10 10 6    < > = values in this interval not displayed.    Lipids No results for input(s): "CHOL", "TRIG", "HDL", "LABVLDL", "LDLCALC", "CHOLHDL" in the last 168 hours.  Hematology Recent Labs  Lab 07/04/22 0320 07/05/22 0508 07/06/22 0546  WBC 11.4* 9.8 11.1*  RBC 2.98* 3.28* 3.22*  HGB 9.2* 10.1* 9.9*  HCT 27.5* 30.2* 28.7*  MCV 92.3 92.1 89.1  MCH 30.9 30.8 30.7  MCHC 33.5 33.4 34.5  RDW 13.8 13.9 13.9  PLT 144* 148* 167   Thyroid No results for input(s): "TSH", "FREET4" in the last 168 hours.  BNPNo results for input(s): "BNP", "PROBNP" in the last 168 hours.  DDimer No results for input(s): "DDIMER" in the last 168 hours.   Radiology    No results found.  Cardiac Studies    Patient Profile     NERA GOLIK is a 67 y.o. female with a hx of paroxysmal atrial fibrillation, HTN, Type 2 DM, anemia and history of breast cancer who is being seen 07/07/2022 for the evaluation of atrial fibrillation and acute HFmrEF at the request of Dr. Arbutus Leas.   Assessment & Plan     1.Afib - history of PAF, admitted with a fib with RVR - initially on IV cardizem, issues with low bp's. Transitoned to IV amio transitoned to oral - likely plan for short course of amio as RVR may have been due to sepsis/pneumonia, likely d/c amio at f/u - she is on eliquis for stroke prevention  - currently on amio 200mg  bid, metoprolol 50mg  bid, eliquis 5mg  bid  2. Acute HFmrEF -03/2019 echo LVEF 55-60% - 07/2022 echo LVEF 45-50%, tachycardic at the time of study - plans for limited echo in outpatient setting before initiating extensive HF regimen  - negative 1L yesterday, neg since admission according to charting. Received IV lasix 40mg  x 1 yesterday. Down trend in Cr consistent with venous congestion and HF. Rest vest down to 18% - transitioned to oral lasix - appears euvolemic   No additional cardiology recs at this time, we will arrange outpatient f/u. Will need a f/u echo as outpatient to reasses LV function.     For questions or updates, please contact [facility] Please consult [email or web address] for contact info under        Signed, Dina Rich, MD  07/10/2022, 8:13 AM

## 2022-07-10 NOTE — Care Management Important Message (Signed)
Important Message  Patient Details  Name: Lisa Thornton MRN: [id number] Date of Birth: [birth year]   Medicare Important Message Given:  Yes     Corey Harold 07/10/2022, 10:32 AM

## 2022-07-11 NOTE — Consult Note (Signed)
   [facility] Inpatient Consult   07/11/2022  ASHAYLA LATVALA Oct 31, 1955 [id number]    Location: [facility] RN Hospital Liaison screened remotely([facility].   [facility] Customer service manager [facility]) Accountable Care Organization [ACO] Patient: Insurance [facility])    Primary Care Provider:  Anabel Halon, MD with [facility]   Patient screened for readmission hospitalization with noted medium high risk score for unplanned readmission risk with 1 IP in 6 months. [facility]/Population Health RN liaison will assess for potential [facility]) Care Management service needs for post hospital transition for care coordination. Pt went to a facility that [facility] PAC-RN does not follow.    Plan: Pt discharged to [facility] does not follow this facility.   [facility] Care Management/Population Health does not replace or interfere with any arrangements made by the Inpatient Transition of Care team.   For questions contact:   Elliot Cousin, RN, BSN [facility] Liaison [place]   [facility]  Population Health Office Hours MTWF 7:30 am to 6 pm [phone] mobile [phone] [Office toll free line][facility] Office Hours are M-F 8:30 - 5 pm 24 hour nurse advise line [phone] Conceirge  [email or web address]

## 2022-07-12 DIAGNOSIS — J9600 Acute respiratory failure, unspecified whether with hypoxia or hypercapnia: Secondary | ICD-10-CM | POA: Diagnosis not present

## 2022-07-12 DIAGNOSIS — D649 Anemia, unspecified: Secondary | ICD-10-CM | POA: Diagnosis not present

## 2022-07-12 DIAGNOSIS — R5381 Other malaise: Secondary | ICD-10-CM | POA: Diagnosis not present

## 2022-07-12 DIAGNOSIS — I509 Heart failure, unspecified: Secondary | ICD-10-CM | POA: Diagnosis not present

## 2022-07-12 DIAGNOSIS — N179 Acute kidney failure, unspecified: Secondary | ICD-10-CM | POA: Diagnosis not present

## 2022-07-12 DIAGNOSIS — I10 Essential (primary) hypertension: Secondary | ICD-10-CM | POA: Diagnosis not present

## 2022-07-12 DIAGNOSIS — I482 Chronic atrial fibrillation, unspecified: Secondary | ICD-10-CM | POA: Diagnosis not present

## 2022-07-12 DIAGNOSIS — C50912 Malignant neoplasm of unspecified site of left female breast: Secondary | ICD-10-CM | POA: Diagnosis not present

## 2022-07-12 DIAGNOSIS — J811 Chronic pulmonary edema: Secondary | ICD-10-CM | POA: Diagnosis not present

## 2022-07-12 DIAGNOSIS — G9341 Metabolic encephalopathy: Secondary | ICD-10-CM | POA: Diagnosis not present

## 2022-07-18 DIAGNOSIS — J9600 Acute respiratory failure, unspecified whether with hypoxia or hypercapnia: Secondary | ICD-10-CM | POA: Diagnosis not present

## 2022-07-18 DIAGNOSIS — J811 Chronic pulmonary edema: Secondary | ICD-10-CM | POA: Diagnosis not present

## 2022-07-24 DIAGNOSIS — E119 Type 2 diabetes mellitus without complications: Secondary | ICD-10-CM | POA: Diagnosis not present

## 2022-07-24 DIAGNOSIS — I509 Heart failure, unspecified: Secondary | ICD-10-CM | POA: Diagnosis not present

## 2022-07-24 DIAGNOSIS — I482 Chronic atrial fibrillation, unspecified: Secondary | ICD-10-CM | POA: Diagnosis not present

## 2022-07-25 ENCOUNTER — Telehealth: Payer: Self-pay | Admitting: Internal Medicine

## 2022-07-25 NOTE — Telephone Encounter (Signed)
TOC patient discharge from [facility] 04.23.2024.  appt has been scheduled with dr Allena Katz.

## 2022-07-26 ENCOUNTER — Telehealth: Payer: Self-pay | Admitting: Internal Medicine

## 2022-07-26 ENCOUNTER — Telehealth: Payer: Self-pay

## 2022-07-26 NOTE — Telephone Encounter (Signed)
[facility] for MRN [id number] Lisa Thornton d/c from [facility] 04.23 scheduled appt with patel 05.02.2024 for toc

## 2022-07-26 NOTE — Telephone Encounter (Signed)
Transition Care Management Unsuccessful Follow-up Telephone Call  Date of discharge and from where:  07/25/2022 from [facility]  Attempts:  1st Attempt  Reason for unsuccessful TCM follow-up call:  Unable to leave message

## 2022-07-26 NOTE — Telephone Encounter (Signed)
Unable to reach pt

## 2022-07-27 ENCOUNTER — Telehealth: Payer: Self-pay

## 2022-07-27 NOTE — Telephone Encounter (Signed)
Unable to reach pt

## 2022-07-27 NOTE — Telephone Encounter (Signed)
Transition Care Management Unsuccessful Follow-up Telephone Call  Date of discharge and from where:  [facility]   Attempts:  2nd Attempt  Reason for unsuccessful TCM follow-up call:  Unable to reach patient

## 2022-07-28 DIAGNOSIS — I509 Heart failure, unspecified: Secondary | ICD-10-CM | POA: Diagnosis not present

## 2022-07-28 DIAGNOSIS — C50112 Malignant neoplasm of central portion of left female breast: Secondary | ICD-10-CM | POA: Diagnosis not present

## 2022-07-28 DIAGNOSIS — E1165 Type 2 diabetes mellitus with hyperglycemia: Secondary | ICD-10-CM | POA: Diagnosis not present

## 2022-07-28 DIAGNOSIS — E876 Hypokalemia: Secondary | ICD-10-CM | POA: Diagnosis not present

## 2022-07-28 DIAGNOSIS — J9601 Acute respiratory failure with hypoxia: Secondary | ICD-10-CM | POA: Diagnosis not present

## 2022-07-28 DIAGNOSIS — N390 Urinary tract infection, site not specified: Secondary | ICD-10-CM | POA: Diagnosis not present

## 2022-07-28 DIAGNOSIS — M6281 Muscle weakness (generalized): Secondary | ICD-10-CM | POA: Diagnosis not present

## 2022-07-28 DIAGNOSIS — Z7984 Long term (current) use of oral hypoglycemic drugs: Secondary | ICD-10-CM | POA: Diagnosis not present

## 2022-07-28 DIAGNOSIS — Z7901 Long term (current) use of anticoagulants: Secondary | ICD-10-CM | POA: Diagnosis not present

## 2022-07-28 DIAGNOSIS — I482 Chronic atrial fibrillation, unspecified: Secondary | ICD-10-CM | POA: Diagnosis not present

## 2022-07-28 DIAGNOSIS — G9341 Metabolic encephalopathy: Secondary | ICD-10-CM | POA: Diagnosis not present

## 2022-07-28 DIAGNOSIS — I110 Hypertensive heart disease with heart failure: Secondary | ICD-10-CM | POA: Diagnosis not present

## 2022-07-28 DIAGNOSIS — D649 Anemia, unspecified: Secondary | ICD-10-CM | POA: Diagnosis not present

## 2022-07-28 DIAGNOSIS — E1143 Type 2 diabetes mellitus with diabetic autonomic (poly)neuropathy: Secondary | ICD-10-CM | POA: Diagnosis not present

## 2022-08-01 ENCOUNTER — Ambulatory Visit: Payer: 59 | Attending: Internal Medicine | Admitting: Internal Medicine

## 2022-08-01 ENCOUNTER — Encounter: Payer: Self-pay | Admitting: Internal Medicine

## 2022-08-01 VITALS — BP 122/78 | HR 74 | Ht 64.0 in | Wt 138.0 lb

## 2022-08-01 DIAGNOSIS — I429 Cardiomyopathy, unspecified: Secondary | ICD-10-CM | POA: Diagnosis not present

## 2022-08-01 DIAGNOSIS — I480 Paroxysmal atrial fibrillation: Secondary | ICD-10-CM

## 2022-08-01 DIAGNOSIS — I5032 Chronic diastolic (congestive) heart failure: Secondary | ICD-10-CM | POA: Diagnosis not present

## 2022-08-01 DIAGNOSIS — E785 Hyperlipidemia, unspecified: Secondary | ICD-10-CM | POA: Insufficient documentation

## 2022-08-01 DIAGNOSIS — E7849 Other hyperlipidemia: Secondary | ICD-10-CM

## 2022-08-01 DIAGNOSIS — I4819 Other persistent atrial fibrillation: Secondary | ICD-10-CM

## 2022-08-01 NOTE — Addendum Note (Signed)
Addended by: Leonides Schanz C on: 08/01/2022 01:44 PM   Modules accepted: Orders

## 2022-08-01 NOTE — Patient Instructions (Signed)
Medication Instructions:  Your physician has recommended you make the following change in your medication:   -Discontinue Amiodarone   *If you need a refill on your cardiac medications before your next appointment, please call your pharmacy*   Lab Work: None If you have labs (blood work) drawn today and your tests are completely normal, you will receive your results only by: MyChart Message (if you have MyChart) OR A paper copy in the mail If you have any lab test that is abnormal or we need to change your treatment, we will call you to review the results.   Testing/Procedures: Your physician has requested that you have an echocardiogram. Echocardiography is a painless test that uses sound waves to create images of your heart. It provides your doctor with information about the size and shape of your heart and how well your heart's chambers and valves are working. This procedure takes approximately one hour. There are no restrictions for this procedure. Please do NOT wear cologne, perfume, aftershave, or lotions (deodorant is allowed). Please arrive 15 minutes prior to your appointment time.    Follow-Up: At [facility], you and your health needs are our priority.  As part of our continuing mission to provide you with exceptional heart care, we have created designated Provider Care Teams.  These Care Teams include your primary Cardiologist (physician) and Advanced Practice Providers (APPs -  Physician Assistants and Nurse Practitioners) who all work together to provide you with the care you need, when you need it.  We recommend signing up for the patient portal called "MyChart".  Sign up information is provided on this After Visit Summary.  MyChart is used to connect with patients for Virtual Visits (Telemedicine).  Patients are able to view lab/test results, encounter notes, upcoming appointments, etc.  Non-urgent messages can be sent to your provider as well.   To learn more  about what you can do with MyChart, go to [email or web address].    Your next appointment:   6 month(s)  Provider:   Luane School, MD    Other Instructions

## 2022-08-01 NOTE — Progress Notes (Signed)
Sleep Apnea Evaluation  [facility]  Today's Date: 08/01/2022   Patient Name: Lisa Thornton        DOB: 1955/05/28       Height:        Weight: 138 lb (62.6 kg)  BMI: Body mass index is 23.69 kg/m.    Referring Provider:  Luane School, MD   STOP-BANG RISK ASSESSMENT         If STOP-BANG Score ?3 OR two clinical symptoms - patient qualifies for WatchPAT (CPT 95800)      Sleep study ordered due to two (2) of the following clinical symptoms/diagnoses:  Excessive daytime sleepiness G47.10  Gastroesophageal reflux K21.9  Nocturia R35.1  Morning Headaches G44.221  Difficulty concentrating R41.840  Memory problems or poor judgment G31.84  Personality changes or irritability R45.4  Loud snoring R06.83  Depression F32.9  Unrefreshed by sleep G47.8  Impotence N52.9  History of high blood pressure R03.0  Insomnia G47.00  Sleep Disordered Breathing or Sleep Apnea ICD G47.33

## 2022-08-01 NOTE — Addendum Note (Signed)
Addended by: Herbert Deaner on: 08/01/2022 01:57 PM   Modules accepted: Orders

## 2022-08-01 NOTE — Progress Notes (Signed)
Cardiology Office Note  Date: 08/01/2022   ID: Lisa Thornton 1955-12-27, MRN [id number]  PCP:  Anabel Halon, MD  Cardiologist:  Marjo Bicker, MD Electrophysiologist:  None   Reason for Office Visit: Posthospitalization follow-up visit.   History of Present Illness: Lisa Thornton is a 67 y.o. female known to have persistent A-fib, HTN, DM 2 is here for posthospitalization follow-up visit.  Patient was recently admitted to [facility] in 07/2022 for the management of acute hypoxic respiratory failure secondary to heart failure exacerbation on BiPAP, A-fib with RVR initially on Cardizem which was switched to IV amiodarone drip due to hypotension with Cardizem and E. coli bacteremia on antibiotics. Echo showed LVEF 45 to 50% in setting of tachycardia, 120s.  Her prior echo showed normal LVEF.  She was discharged on amiodarone 200 mg once daily, metoprolol tartrate 50 mg twice daily and Eliquis 5 mg twice daily. Patient is here for follow-up visit.  No symptoms, overall improving.  Denies any angina or DOE.  She went into rehab and now discharged to home.  Walking with a walker.  Denies any palpitations, angina, DOE, syncope.  Past Medical History:  Diagnosis Date   Anemia 12/31/2017   Cancer (HCC)    left breast cancer   Chest pain    Diabetes mellitus without complication (HCC)    Hypertension     Past Surgical History:  Procedure Laterality Date   COLONOSCOPY WITH PROPOFOL N/A 09/27/2021   Procedure: COLONOSCOPY WITH PROPOFOL;  Surgeon: Dolores Frame, MD;  [facility];  Service: Gastroenterology;  Laterality: N/A;  945   MASTECTOMY MODIFIED RADICAL Left 12/31/2017   Procedure: LEFT MODIFIED RADICAL MASTECTOMY;  Surgeon: Franky Macho, MD;  Location: [facility];  Service: General;  Laterality: Left;   POLYPECTOMY  09/27/2021   Procedure: POLYPECTOMY;  Surgeon: Dolores Frame, MD;  [facility];  Service: Gastroenterology;;    PORTACATH PLACEMENT Right 06/27/2017   Procedure: INSERTION PORT-A-CATH;  Surgeon: Franky Macho, MD;  Location: [facility];  Service: General;  Laterality: Right;    Current Outpatient Medications  Medication Sig Dispense Refill   Accu-Chek Softclix Lancets lancets USE 1  TO CHECK GLUCOSE UP TO 4 TIMES DAILY 100 each 0   Acetaminophen-DM (CORICIDIN HBP COLD/COUGH/FLU PO) Take 1 tablet by mouth daily as needed (cough).     anastrozole (ARIMIDEX) 1 MG tablet Take 1 tablet by mouth once daily 90 tablet 0   apixaban (ELIQUIS) 5 MG TABS tablet Take 1 tablet (5 mg total) by mouth 2 (two) times daily. 180 tablet 3   blood glucose meter kit and supplies Dispense based on patient and insurance preference. Use up to four times daily as directed. (FOR ICD-10 E10.9, E11.9). 1 each 0   calcium carbonate (OS-CAL) 600 MG TABS tablet Take 600 mg by mouth daily with breakfast.     cefdinir (OMNICEF) 300 MG capsule Take 1 capsule (300 mg total) by mouth every 12 (twelve) hours. X 3 more days     cholecalciferol (VITAMIN D3) 25 MCG (1000 UT) tablet Take 1,000 Units by mouth 2 (two) times daily.      furosemide (LASIX) 40 MG tablet Take 1 tablet (40 mg total) by mouth daily. 30 tablet    glucose blood (ACCU-CHEK GUIDE) test strip USE 1 STRIP TO CHECK GLUCOSE THREE TIMES DAILY IN THE MORNING, NOON, AND AT BEDTIME AS DIRECTED 100 each 0   magnesium oxide (MAG-OX) 400 (240 Mg)  MG tablet Take 1 tablet (400 mg total) by mouth daily.     metFORMIN (GLUCOPHAGE) 1000 MG tablet TAKE 1 TABLET BY MOUTH BEFORE BREAKFAST (Patient taking differently: Take 1,000 mg by mouth daily with breakfast.) 90 tablet 0   metoprolol tartrate (LOPRESSOR) 50 MG tablet Take 1 tablet by mouth twice daily 180 tablet 0   Omega-3 Fatty Acids (FISH OIL) 1000 MG CAPS Take 1 capsule by mouth 2 (two) times daily.     rosuvastatin (CRESTOR) 5 MG tablet Take 1 tablet (5 mg total) by mouth daily. 90 tablet 3   Semaglutide (RYBELSUS) 7 MG TABS Take 1 tablet  (7 mg total) by mouth daily. 90 tablet 1   No current facility-administered medications for this visit.   Allergies:  Patient has no known allergies.   Social History: The patient  reports that she has never smoked. She has never used smokeless tobacco. She reports that she does not drink alcohol and does not use drugs.   Family History: The patient's family history includes Alzheimer's disease in her maternal aunt; Breast cancer in her mother; Cancer in her sister; Heart attack in her brother, father, and sister; Heart disease in her mother; Hypertension in her sister; Stroke in her brother; Thyroid disease in her mother.   ROS:  Please see the history of present illness. Otherwise, complete review of systems is positive for none.  All other systems are reviewed and negative.   Physical Exam: VS:  BP 122/78   Pulse 74   Wt 138 lb (62.6 kg)   SpO2 97%   BMI 23.69 kg/m , BMI Body mass index is 23.69 kg/m.  Wt Readings from Last 3 Encounters:  08/01/22 138 lb (62.6 kg)  07/08/22 144 lb 6.4 oz (65.5 kg)  04/26/22 151 lb (68.5 kg)    General: Patient appears comfortable at rest. HEENT: Conjunctiva and lids normal, oropharynx clear with moist mucosa. Neck: Supple, no elevated JVP or carotid bruits, no thyromegaly. Lungs: Clear to auscultation, nonlabored breathing at rest. Cardiac: Irregular rate and rhythm, no S3 or significant systolic murmur, no pericardial rub. Abdomen: Soft, nontender, no hepatomegaly, bowel sounds present, no guarding or rebound. Extremities: No pitting edema, distal pulses 2+. Skin: Warm and dry. Musculoskeletal: No kyphosis. Neuropsychiatric: Alert and oriented x3, affect grossly appropriate.  Recent Labwork: 12/12/2021: TSH 1.480 07/06/2022: ALT 16; AST 26; Hemoglobin 9.9; Platelets 167 07/10/2022: BUN 21; Creatinine, Ser 0.84; Magnesium 1.8; Potassium 4.5; Sodium 133     Component Value Date/Time   CHOL 212 (H) 12/12/2021 1704   TRIG 203 (H) 12/12/2021  1704   HDL 39 (L) 12/12/2021 1704   CHOLHDL 5.4 (H) 12/12/2021 1704   LDLCALC 136 (H) 12/12/2021 1704    Other Studies Reviewed Today:   Assessment and Plan: Patient is a 67 year old F known to have persistent A-fib, HTN, DM 2 is here for posthospitalization follow-up visit.  # Persistent A-fib # Cardiomyopathy LVEF 45 to 50% (previously was normal) -Continue metoprolol tartrate 50 mg twice daily, and Eliquis 5 mg twice daily. Amiodarone was started during 07/2022 hospitalization for A-fib with RVR. I will discontinue amiodarone today. She also had new onset cardiomyopathy LVEF 45 to 50% during 07/2022 hospitalization. Will repeat limited 2D echocardiogram for LVEF assessment. If her LVEF continues to be low, she will benefit from EP referral and GDMT initiation.  # Chronic diastolic heart failure -Continue p.o. Lasix 40 mg once daily  # HLD -Continue rosuvastatin 5 mg nightly   I have spent  a total of 30 minutes with patient reviewing chart, EKGs, labs and examining patient as well as establishing an assessment and plan that was discussed with the patient.  > 50% of time was spent in direct patient care.    Medication Adjustments/Labs and Tests Ordered: Current medicines are reviewed at length with the patient today.  Concerns regarding medicines are outlined above.   Tests Ordered: No orders of the defined types were placed in this encounter.   Medication Changes: No orders of the defined types were placed in this encounter.   Disposition:  Follow up  6 months  Signed, Amy Belloso Verne Spurr, MD, 08/01/2022 1:18 PM    [facility] at [facility], [place] [address] [place], [place] [address]

## 2022-08-01 NOTE — Addendum Note (Signed)
Addended by: Marlyn Corporal A on: 08/01/2022 02:18 PM   Modules accepted: Orders

## 2022-08-03 ENCOUNTER — Inpatient Hospital Stay: Payer: 59 | Admitting: Internal Medicine

## 2022-08-03 DIAGNOSIS — D649 Anemia, unspecified: Secondary | ICD-10-CM | POA: Diagnosis not present

## 2022-08-03 DIAGNOSIS — I482 Chronic atrial fibrillation, unspecified: Secondary | ICD-10-CM | POA: Diagnosis not present

## 2022-08-03 DIAGNOSIS — G9341 Metabolic encephalopathy: Secondary | ICD-10-CM | POA: Diagnosis not present

## 2022-08-03 DIAGNOSIS — I110 Hypertensive heart disease with heart failure: Secondary | ICD-10-CM | POA: Diagnosis not present

## 2022-08-03 DIAGNOSIS — E1165 Type 2 diabetes mellitus with hyperglycemia: Secondary | ICD-10-CM | POA: Diagnosis not present

## 2022-08-03 DIAGNOSIS — E1143 Type 2 diabetes mellitus with diabetic autonomic (poly)neuropathy: Secondary | ICD-10-CM | POA: Diagnosis not present

## 2022-08-03 DIAGNOSIS — N390 Urinary tract infection, site not specified: Secondary | ICD-10-CM | POA: Diagnosis not present

## 2022-08-03 DIAGNOSIS — Z7984 Long term (current) use of oral hypoglycemic drugs: Secondary | ICD-10-CM | POA: Diagnosis not present

## 2022-08-03 DIAGNOSIS — J9601 Acute respiratory failure with hypoxia: Secondary | ICD-10-CM | POA: Diagnosis not present

## 2022-08-03 DIAGNOSIS — I509 Heart failure, unspecified: Secondary | ICD-10-CM | POA: Diagnosis not present

## 2022-08-03 DIAGNOSIS — M6281 Muscle weakness (generalized): Secondary | ICD-10-CM | POA: Diagnosis not present

## 2022-08-03 DIAGNOSIS — C50112 Malignant neoplasm of central portion of left female breast: Secondary | ICD-10-CM | POA: Diagnosis not present

## 2022-08-03 DIAGNOSIS — Z7901 Long term (current) use of anticoagulants: Secondary | ICD-10-CM | POA: Diagnosis not present

## 2022-08-03 DIAGNOSIS — E876 Hypokalemia: Secondary | ICD-10-CM | POA: Diagnosis not present

## 2022-08-04 ENCOUNTER — Telehealth: Payer: Self-pay | Admitting: Internal Medicine

## 2022-08-04 DIAGNOSIS — M6281 Muscle weakness (generalized): Secondary | ICD-10-CM | POA: Diagnosis not present

## 2022-08-04 DIAGNOSIS — I110 Hypertensive heart disease with heart failure: Secondary | ICD-10-CM | POA: Diagnosis not present

## 2022-08-04 DIAGNOSIS — E876 Hypokalemia: Secondary | ICD-10-CM | POA: Diagnosis not present

## 2022-08-04 DIAGNOSIS — I482 Chronic atrial fibrillation, unspecified: Secondary | ICD-10-CM | POA: Diagnosis not present

## 2022-08-04 DIAGNOSIS — Z7984 Long term (current) use of oral hypoglycemic drugs: Secondary | ICD-10-CM | POA: Diagnosis not present

## 2022-08-04 DIAGNOSIS — N390 Urinary tract infection, site not specified: Secondary | ICD-10-CM | POA: Diagnosis not present

## 2022-08-04 DIAGNOSIS — G9341 Metabolic encephalopathy: Secondary | ICD-10-CM | POA: Diagnosis not present

## 2022-08-04 DIAGNOSIS — C50112 Malignant neoplasm of central portion of left female breast: Secondary | ICD-10-CM | POA: Diagnosis not present

## 2022-08-04 DIAGNOSIS — I509 Heart failure, unspecified: Secondary | ICD-10-CM | POA: Diagnosis not present

## 2022-08-04 DIAGNOSIS — D649 Anemia, unspecified: Secondary | ICD-10-CM | POA: Diagnosis not present

## 2022-08-04 DIAGNOSIS — E1143 Type 2 diabetes mellitus with diabetic autonomic (poly)neuropathy: Secondary | ICD-10-CM | POA: Diagnosis not present

## 2022-08-04 DIAGNOSIS — Z7901 Long term (current) use of anticoagulants: Secondary | ICD-10-CM | POA: Diagnosis not present

## 2022-08-04 DIAGNOSIS — J9601 Acute respiratory failure with hypoxia: Secondary | ICD-10-CM | POA: Diagnosis not present

## 2022-08-04 DIAGNOSIS — E1165 Type 2 diabetes mellitus with hyperglycemia: Secondary | ICD-10-CM | POA: Diagnosis not present

## 2022-08-04 NOTE — Telephone Encounter (Signed)
Iona Coach advised with ok to verbal orders

## 2022-08-04 NOTE — Telephone Encounter (Signed)
Zacharia called from [facility] home health needs verbal orders. Call back # [phone]

## 2022-08-04 NOTE — Telephone Encounter (Signed)
Occupational therapy frequency 1 x week for 7 weeks for ADL and IDL exercises and activity

## 2022-08-07 ENCOUNTER — Encounter: Payer: Self-pay | Admitting: Internal Medicine

## 2022-08-07 ENCOUNTER — Ambulatory Visit (INDEPENDENT_AMBULATORY_CARE_PROVIDER_SITE_OTHER): Payer: 59 | Admitting: Internal Medicine

## 2022-08-07 ENCOUNTER — Telehealth: Payer: Self-pay | Admitting: *Deleted

## 2022-08-07 VITALS — BP 109/74 | HR 45 | Ht 64.0 in | Wt 140.4 lb

## 2022-08-07 DIAGNOSIS — I10 Essential (primary) hypertension: Secondary | ICD-10-CM

## 2022-08-07 DIAGNOSIS — G9341 Metabolic encephalopathy: Secondary | ICD-10-CM

## 2022-08-07 DIAGNOSIS — J181 Lobar pneumonia, unspecified organism: Secondary | ICD-10-CM | POA: Diagnosis not present

## 2022-08-07 DIAGNOSIS — I429 Cardiomyopathy, unspecified: Secondary | ICD-10-CM | POA: Diagnosis not present

## 2022-08-07 DIAGNOSIS — Z09 Encounter for follow-up examination after completed treatment for conditions other than malignant neoplasm: Secondary | ICD-10-CM | POA: Diagnosis not present

## 2022-08-07 DIAGNOSIS — I4891 Unspecified atrial fibrillation: Secondary | ICD-10-CM | POA: Diagnosis not present

## 2022-08-07 DIAGNOSIS — E1140 Type 2 diabetes mellitus with diabetic neuropathy, unspecified: Secondary | ICD-10-CM | POA: Diagnosis not present

## 2022-08-07 DIAGNOSIS — D509 Iron deficiency anemia, unspecified: Secondary | ICD-10-CM | POA: Insufficient documentation

## 2022-08-07 NOTE — Assessment & Plan Note (Signed)
Likely due to poor nutrition and chronic anticoagulation Check CBC Advised to take iron supplement

## 2022-08-07 NOTE — Assessment & Plan Note (Signed)
Due to lobar pneumonia and E. coli bacteremia Now resolved

## 2022-08-07 NOTE — Assessment & Plan Note (Signed)
Hospital chart reviewed, including discharge summary Medications reconciled and reviewed with the patient in detail 

## 2022-08-07 NOTE — Progress Notes (Signed)
Established Patient Office Visit  Subjective:  Patient ID: Lisa Thornton, female    DOB: [birth year]  Age: 67 y.o. MRN: [id number]  CC:  Chief Complaint  Patient presents with   Hospitalization Follow-up    [facility] follow up    HPI Lisa Thornton is a 67 y.o. female with past medical history of HTN, A. Fib., type 2 DM and breast ca s/p mastectomy and chemotherapy who presents for follow-up after recent hospitalization.  She was admitted at [facility] from 07/03/22-07/10/22 for acute metabolic encephalopathy secondary to lobar pneumonia /bacteremia and A-fib with RVR. She was discharged to [facility] rehab facility, and was discharged to home on 07/25/22.  She was given ceftriaxone and later transition to oral cefdinir for E. coli bacteremia.  She had acute respiratory failure with hypoxia due to pulmonary edema and was given IV diuresis during hospital course as well.  She was placed on BiPAP initially, and was later transitioned to O2 via nasal cannula and later to room air.  She was placed on amiodarone drip, and was later transitioned to oral amiodarone for A-fib with RVR.  Her amiodarone has been discontinued by cardiology during outpatient follow-up recently.  She still takes metoprolol and Eliquis.    Past Medical History:  Diagnosis Date   Anemia 12/31/2017   Cancer (HCC)    left breast cancer   Chest pain    Diabetes mellitus without complication (HCC)    Hypertension     Past Surgical History:  Procedure Laterality Date   COLONOSCOPY WITH PROPOFOL N/A 09/27/2021   Procedure: COLONOSCOPY WITH PROPOFOL;  Surgeon: Dolores Frame, MD;  [facility];  Service: Gastroenterology;  Laterality: N/A;  945   MASTECTOMY MODIFIED RADICAL Left 12/31/2017   Procedure: LEFT MODIFIED RADICAL MASTECTOMY;  Surgeon: Franky Macho, MD;  Location: [facility];  Service: General;  Laterality: Left;   POLYPECTOMY  09/27/2021   Procedure:  POLYPECTOMY;  Surgeon: Dolores Frame, MD;  [facility];  Service: Gastroenterology;;   PORTACATH PLACEMENT Right 06/27/2017   Procedure: INSERTION PORT-A-CATH;  Surgeon: Franky Macho, MD;  Location: [facility];  Service: General;  Laterality: Right;    Family History  Problem Relation Age of Onset   Breast cancer Mother    Thyroid disease Mother    Heart disease Mother    Heart attack Father    Heart attack Sister    Hypertension Sister    Heart attack Brother    Cancer Sister    Stroke Brother    Alzheimer's disease Maternal Aunt     Social History   Socioeconomic History   Marital status: Divorced    Spouse name: Not on file   Number of children: 2   Years of education: 10   Highest education level: 10th grade  Occupational History   Occupation: Psychologist, 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  Tobacco Use   Smoking status: Never   Smokeless tobacco: Never  Vaping Use   Vaping Use: Never used  Substance and Sexual Activity   Alcohol use: No   Drug use: No   Sexual activity: Not Currently  Other Topics Concern   Not on file  Social History Narrative   Lives with sister   2 children   Dog: Cozie      Enjoys: puzzles, sewing, and walk      Diet: eats all food groups outside: leafy greens    Caffeine: limited, sweet tea   Water: 6-8 cups daily  No car; does have license, wears seat belt   Smoke detector at home   [place] area    Social Determinants of Health   Financial Resource Strain: Low Risk  (03/29/2021)   Overall Financial Resource Strain (CARDIA)    Difficulty of Paying Living Expenses: Not hard at all  Food Insecurity: No Food Insecurity (07/03/2022)   Hunger Vital Sign    Worried About Running Out of Food in the Last Year: Never true    Ran Out of Food in the Last Year: Never true  Transportation Needs: No Transportation Needs (07/03/2022)   PRAPARE - Administrator, Civil Service (Medical): No    Lack of Transportation (Non-Medical): No   Physical Activity: Insufficiently Active (03/29/2021)   Exercise Vital Sign    Days of Exercise per Week: 7 days    Minutes of Exercise per Session: 20 min  Stress: No Stress Concern Present (03/29/2021)   [employer] of Occupational Health - Occupational Stress Questionnaire    Feeling of Stress : Not at all  Social Connections: Socially Isolated (03/29/2021)   Social Connection and Isolation Panel [NHANES]    Frequency of Communication with Friends and Family: More than three times a week    Frequency of Social Gatherings with Friends and Family: Once a week    Attends Religious Services: Never    Database administrator or Organizations: No    Attends Banker Meetings: Never    Marital Status: Divorced  Catering manager Violence: Patient Unable To Answer (07/03/2022)   Humiliation, Afraid, Rape, and Kick questionnaire    Fear of Current or Ex-Partner: Patient unable to answer    Emotionally Abused: Patient unable to answer    Physically Abused: Patient unable to answer    Sexually Abused: Patient unable to answer    Outpatient Medications Prior to Visit  Medication Sig Dispense Refill   Accu-Chek Softclix Lancets lancets USE 1  TO CHECK GLUCOSE UP TO 4 TIMES DAILY 100 each 0   Acetaminophen-DM (CORICIDIN HBP COLD/COUGH/FLU PO) Take 1 tablet by mouth daily as needed (cough).     anastrozole (ARIMIDEX) 1 MG tablet Take 1 tablet by mouth once daily 90 tablet 0   apixaban (ELIQUIS) 5 MG TABS tablet Take 1 tablet (5 mg total) by mouth 2 (two) times daily. 180 tablet 3   blood glucose meter kit and supplies Dispense based on patient and insurance preference. Use up to four times daily as directed. (FOR ICD-10 E10.9, E11.9). 1 each 0   calcium carbonate (OS-CAL) 600 MG TABS tablet Take 600 mg by mouth daily with breakfast.     cholecalciferol (VITAMIN D3) 25 MCG (1000 UT) tablet Take 1,000 Units by mouth 2 (two) times daily.      furosemide (LASIX) 40 MG tablet Take 1  tablet (40 mg total) by mouth daily. 30 tablet    glucose blood (ACCU-CHEK GUIDE) test strip USE 1 STRIP TO CHECK GLUCOSE THREE TIMES DAILY IN THE MORNING, NOON, AND AT BEDTIME AS DIRECTED 100 each 0   magnesium oxide (MAG-OX) 400 (240 Mg) MG tablet Take 1 tablet (400 mg total) by mouth daily.     metFORMIN (GLUCOPHAGE) 1000 MG tablet TAKE 1 TABLET BY MOUTH BEFORE BREAKFAST (Patient taking differently: Take 1,000 mg by mouth daily with breakfast.) 90 tablet 0   metoprolol tartrate (LOPRESSOR) 50 MG tablet Take 1 tablet by mouth twice daily 180 tablet 0   Omega-3 Fatty Acids (FISH OIL) 1000 MG  CAPS Take 1 capsule by mouth 2 (two) times daily.     rosuvastatin (CRESTOR) 5 MG tablet Take 1 tablet (5 mg total) by mouth daily. 90 tablet 3   Semaglutide (RYBELSUS) 7 MG TABS Take 1 tablet (7 mg total) by mouth daily. 90 tablet 1   amiodarone (PACERONE) 200 MG tablet Take 200 mg by mouth daily.     cefdinir (OMNICEF) 300 MG capsule Take 1 capsule (300 mg total) by mouth every 12 (twelve) hours. X 3 more days     No facility-administered medications prior to visit.    No Known Allergies  ROS Review of Systems  Constitutional:  Positive for fatigue. Negative for chills and fever.  HENT:  Negative for congestion, sinus pressure, sinus pain and sore throat.   Eyes:  Negative for pain and discharge.  Respiratory:  Negative for cough and shortness of breath.   Cardiovascular:  Negative for chest pain and palpitations.  Gastrointestinal:  Negative for abdominal pain, diarrhea, nausea and vomiting.  Endocrine: Negative for polydipsia and polyuria.  Genitourinary:  Negative for dysuria, frequency and hematuria.  Musculoskeletal:  Negative for neck pain and neck stiffness.  Skin:  Negative for rash.  Neurological:  Negative for dizziness and weakness.  Psychiatric/Behavioral:  Negative for agitation and behavioral problems.       Objective:    Physical Exam Vitals reviewed.  Constitutional:       General: She is not in acute distress.    Appearance: She is not diaphoretic.  HENT:     Head: Normocephalic and atraumatic.     Nose: Nose normal. No congestion.     Mouth/Throat:     Mouth: Mucous membranes are moist.     Pharynx: No posterior oropharyngeal erythema.  Eyes:     General: No scleral icterus.    Extraocular Movements: Extraocular movements intact.  Neck:     Vascular: No carotid bruit.  Cardiovascular:     Rate and Rhythm: Normal rate. Rhythm irregular.     Pulses: Normal pulses.     Heart sounds: Normal heart sounds. No murmur heard. Pulmonary:     Breath sounds: Normal breath sounds. No wheezing or rales.  Abdominal:     Palpations: Abdomen is soft.     Tenderness: There is no abdominal tenderness.  Musculoskeletal:     Cervical back: Neck supple. No tenderness.     Right lower leg: Edema (1+) present.     Left lower leg: Edema (1+) present.  Skin:    General: Skin is warm.     Findings: No rash.  Neurological:     General: No focal deficit present.     Mental Status: She is alert and oriented to person, place, and time.     Motor: Weakness (B/l LE - 4/5) present.  Psychiatric:        Mood and Affect: Mood normal.        Behavior: Behavior normal.     BP 109/74 (BP Location: Right Arm, Patient Position: Sitting, Cuff Size: Normal)   Pulse (!) 45   Ht 5\' 4"  (1.626 m)   Wt 140 lb 6.4 oz (63.7 kg)   SpO2 98%   BMI 24.10 kg/m  Wt Readings from Last 3 Encounters:  08/07/22 140 lb 6.4 oz (63.7 kg)  08/01/22 138 lb (62.6 kg)  07/08/22 144 lb 6.4 oz (65.5 kg)    Lab Results  Component Value Date   TSH 1.480 12/12/2021   Lab Results  Component  Value Date   WBC 11.1 (H) 07/06/2022   HGB 9.9 (L) 07/06/2022   HCT 28.7 (L) 07/06/2022   MCV 89.1 07/06/2022   PLT 167 07/06/2022   Lab Results  Component Value Date   NA 133 (L) 07/10/2022   K 4.5 07/10/2022   CO2 29 07/10/2022   GLUCOSE 139 (H) 07/10/2022   BUN 21 07/10/2022   CREATININE 0.84  07/10/2022   BILITOT 0.7 07/06/2022   ALKPHOS 62 07/06/2022   AST 26 07/06/2022   ALT 16 07/06/2022   PROT 6.9 07/06/2022   ALBUMIN 2.1 (L) 07/06/2022   CALCIUM 8.8 (L) 07/10/2022   ANIONGAP 6 07/10/2022   EGFR 64 04/26/2022   Lab Results  Component Value Date   CHOL 212 (H) 12/12/2021   Lab Results  Component Value Date   HDL 39 (L) 12/12/2021   Lab Results  Component Value Date   LDLCALC 136 (H) 12/12/2021   Lab Results  Component Value Date   TRIG 203 (H) 12/12/2021   Lab Results  Component Value Date   CHOLHDL 5.4 (H) 12/12/2021   Lab Results  Component Value Date   HGBA1C 7.8 (H) 04/26/2022      Assessment & Plan:   Problem List Items Addressed This Visit       Cardiovascular and Mediastinum   Cardiomyopathy (HCC) (Chronic)    Echocardiogram during hospital course showed LVEF of 45 to 50% Followed by cardiology - planned to get a repeat Echo Continue Lasix Appears euvolemic today      Essential hypertension    BP Readings from Last 1 Encounters:  08/07/22 109/74  Well-controlled with metoprolol Recently discontinued amlodipine, HCTZ due to hypotension and AKI Counseled for compliance with the medications Advised DASH diet and moderate exercise/walking as tolerated      Atrial fibrillation with rapid ventricular response (HCC) - Primary    During the recent hospital course RVR likely due to dehydration and acute metabolic encephalopathy Was placed on amiodarone, recently discontinued by cardiology Continue metoprolol and Eliquis      Relevant Orders   Basic Metabolic Panel (BMET)     Respiratory   Lobar pneumonia (HCC)    Was treated with ceftriaxone and later cefdinir Check repeat CXR      Relevant Orders   DG Chest 2 View     Endocrine   Type 2 diabetes mellitus with diabetic neuropathy, unspecified (HCC) (Chronic)   Relevant Orders   CBC with Differential/Platelet   Hemoglobin A1c     Nervous and Auditory   Acute metabolic  encephalopathy    Due to lobar pneumonia and E. coli bacteremia Now resolved        Other   Iron deficiency anemia    Likely due to poor nutrition and chronic anticoagulation Check CBC Advised to take iron supplement      Relevant Orders   CBC with Differential/Platelet   Hospital discharge follow-up    Hospital chart reviewed, including discharge summary Medications reconciled and reviewed with the patient in detail      Relevant Orders   CBC with Differential/Platelet   Basic Metabolic Panel (BMET)    No orders of the defined types were placed in this encounter.   Follow-up: Return if symptoms worsen or fail to improve.    Anabel Halon, MD

## 2022-08-07 NOTE — Assessment & Plan Note (Signed)
During the recent hospital course RVR likely due to dehydration and acute metabolic encephalopathy Was placed on amiodarone, recently discontinued by cardiology Continue metoprolol and Eliquis

## 2022-08-07 NOTE — Patient Instructions (Signed)
Please continue taking medications as prescribed.  Please eat at regular intervals. Please follow low salt diet and perform leg elevation for leg swelling.  Please get X-ray of chest done at [facility].

## 2022-08-07 NOTE — Assessment & Plan Note (Signed)
Was treated with ceftriaxone and later cefdinir Check repeat CXR

## 2022-08-07 NOTE — Assessment & Plan Note (Signed)
BP Readings from Last 1 Encounters:  08/07/22 109/74   Well-controlled with metoprolol Recently discontinued amlodipine, HCTZ due to hypotension and AKI Counseled for compliance with the medications Advised DASH diet and moderate exercise/walking as tolerated

## 2022-08-07 NOTE — Telephone Encounter (Signed)
Prior Authorization for ITAMAR sent to [employer] via web portal. Tracking Number . READY- NO PA REQ 

## 2022-08-07 NOTE — Telephone Encounter (Signed)
Itamar pin given to [employer]. Ok per [employer].

## 2022-08-07 NOTE — Assessment & Plan Note (Signed)
Echocardiogram during hospital course showed LVEF of 45 to 50% Followed by cardiology - planned to get a repeat Echo Continue Lasix Appears euvolemic today

## 2022-08-08 DIAGNOSIS — E876 Hypokalemia: Secondary | ICD-10-CM | POA: Diagnosis not present

## 2022-08-08 DIAGNOSIS — E1143 Type 2 diabetes mellitus with diabetic autonomic (poly)neuropathy: Secondary | ICD-10-CM | POA: Diagnosis not present

## 2022-08-08 DIAGNOSIS — D649 Anemia, unspecified: Secondary | ICD-10-CM | POA: Diagnosis not present

## 2022-08-08 DIAGNOSIS — I110 Hypertensive heart disease with heart failure: Secondary | ICD-10-CM | POA: Diagnosis not present

## 2022-08-08 DIAGNOSIS — C50112 Malignant neoplasm of central portion of left female breast: Secondary | ICD-10-CM | POA: Diagnosis not present

## 2022-08-08 DIAGNOSIS — N390 Urinary tract infection, site not specified: Secondary | ICD-10-CM | POA: Diagnosis not present

## 2022-08-08 DIAGNOSIS — I482 Chronic atrial fibrillation, unspecified: Secondary | ICD-10-CM | POA: Diagnosis not present

## 2022-08-08 DIAGNOSIS — I509 Heart failure, unspecified: Secondary | ICD-10-CM | POA: Diagnosis not present

## 2022-08-08 DIAGNOSIS — M6281 Muscle weakness (generalized): Secondary | ICD-10-CM | POA: Diagnosis not present

## 2022-08-08 DIAGNOSIS — J9601 Acute respiratory failure with hypoxia: Secondary | ICD-10-CM | POA: Diagnosis not present

## 2022-08-08 DIAGNOSIS — Z7984 Long term (current) use of oral hypoglycemic drugs: Secondary | ICD-10-CM | POA: Diagnosis not present

## 2022-08-08 DIAGNOSIS — E1165 Type 2 diabetes mellitus with hyperglycemia: Secondary | ICD-10-CM | POA: Diagnosis not present

## 2022-08-08 DIAGNOSIS — Z7901 Long term (current) use of anticoagulants: Secondary | ICD-10-CM | POA: Diagnosis not present

## 2022-08-08 DIAGNOSIS — G9341 Metabolic encephalopathy: Secondary | ICD-10-CM | POA: Diagnosis not present

## 2022-08-08 LAB — CBC WITH DIFFERENTIAL/PLATELET
Basophils Absolute: 0.1 10*3/uL (ref 0.0–0.2)
Basos: 1 %
EOS (ABSOLUTE): 0.2 10*3/uL (ref 0.0–0.4)
Eos: 2 %
Hematocrit: 36 % (ref 34.0–46.6)
Hemoglobin: 11.5 g/dL (ref 11.1–15.9)
Immature Grans (Abs): 0 10*3/uL (ref 0.0–0.1)
Immature Granulocytes: 0 %
Lymphocytes Absolute: 3.6 10*3/uL — ABNORMAL HIGH (ref 0.7–3.1)
Lymphs: 33 %
MCH: 29.8 pg (ref 26.6–33.0)
MCHC: 31.9 g/dL (ref 31.5–35.7)
MCV: 93 fL (ref 79–97)
Monocytes Absolute: 0.5 10*3/uL (ref 0.1–0.9)
Monocytes: 4 %
Neutrophils Absolute: 6.5 10*3/uL (ref 1.4–7.0)
Neutrophils: 60 %
Platelets: 202 10*3/uL (ref 150–450)
RBC: 3.86 x10E6/uL (ref 3.77–5.28)
RDW: 14.6 % (ref 11.7–15.4)
WBC: 10.9 10*3/uL — ABNORMAL HIGH (ref 3.4–10.8)

## 2022-08-08 LAB — BASIC METABOLIC PANEL
BUN/Creatinine Ratio: 16 (ref 12–28)
BUN: 23 mg/dL (ref 8–27)
CO2: 26 mmol/L (ref 20–29)
Calcium: 10 mg/dL (ref 8.7–10.3)
Chloride: 96 mmol/L (ref 96–106)
Creatinine, Ser: 1.41 mg/dL — ABNORMAL HIGH (ref 0.57–1.00)
Glucose: 133 mg/dL — ABNORMAL HIGH (ref 70–99)
Potassium: 3.8 mmol/L (ref 3.5–5.2)
Sodium: 141 mmol/L (ref 134–144)
eGFR: 41 mL/min/{1.73_m2} — ABNORMAL LOW (ref >59)

## 2022-08-08 LAB — HEMOGLOBIN A1C
Est. average glucose Bld gHb Est-mCnc: 137 mg/dL
Hgb A1c MFr Bld: 6.4 % — ABNORMAL HIGH (ref 4.8–5.6)

## 2022-08-09 DIAGNOSIS — E1165 Type 2 diabetes mellitus with hyperglycemia: Secondary | ICD-10-CM | POA: Diagnosis not present

## 2022-08-09 DIAGNOSIS — I482 Chronic atrial fibrillation, unspecified: Secondary | ICD-10-CM | POA: Diagnosis not present

## 2022-08-09 DIAGNOSIS — D649 Anemia, unspecified: Secondary | ICD-10-CM | POA: Diagnosis not present

## 2022-08-09 DIAGNOSIS — Z7984 Long term (current) use of oral hypoglycemic drugs: Secondary | ICD-10-CM | POA: Diagnosis not present

## 2022-08-09 DIAGNOSIS — E1143 Type 2 diabetes mellitus with diabetic autonomic (poly)neuropathy: Secondary | ICD-10-CM | POA: Diagnosis not present

## 2022-08-09 DIAGNOSIS — Z7901 Long term (current) use of anticoagulants: Secondary | ICD-10-CM | POA: Diagnosis not present

## 2022-08-09 DIAGNOSIS — J9601 Acute respiratory failure with hypoxia: Secondary | ICD-10-CM | POA: Diagnosis not present

## 2022-08-09 DIAGNOSIS — M6281 Muscle weakness (generalized): Secondary | ICD-10-CM | POA: Diagnosis not present

## 2022-08-09 DIAGNOSIS — I110 Hypertensive heart disease with heart failure: Secondary | ICD-10-CM | POA: Diagnosis not present

## 2022-08-09 DIAGNOSIS — N390 Urinary tract infection, site not specified: Secondary | ICD-10-CM | POA: Diagnosis not present

## 2022-08-09 DIAGNOSIS — E876 Hypokalemia: Secondary | ICD-10-CM | POA: Diagnosis not present

## 2022-08-09 DIAGNOSIS — G9341 Metabolic encephalopathy: Secondary | ICD-10-CM | POA: Diagnosis not present

## 2022-08-09 DIAGNOSIS — I509 Heart failure, unspecified: Secondary | ICD-10-CM | POA: Diagnosis not present

## 2022-08-09 DIAGNOSIS — C50112 Malignant neoplasm of central portion of left female breast: Secondary | ICD-10-CM | POA: Diagnosis not present

## 2022-08-10 ENCOUNTER — Telehealth: Payer: Self-pay | Admitting: Internal Medicine

## 2022-08-10 ENCOUNTER — Other Ambulatory Visit: Payer: Self-pay

## 2022-08-10 ENCOUNTER — Ambulatory Visit (HOSPITAL_COMMUNITY)
Admission: RE | Admit: 2022-08-10 | Discharge: 2022-08-10 | Disposition: A | Discharge status: Home / Self Care | Payer: 59 | Source: Ambulatory Visit | Attending: Internal Medicine | Admitting: Internal Medicine

## 2022-08-10 DIAGNOSIS — J181 Lobar pneumonia, unspecified organism: Secondary | ICD-10-CM | POA: Diagnosis present

## 2022-08-10 MED ORDER — UNABLE TO FIND: 1.0000 | Freq: Every day | 0 refills | Status: DC

## 2022-08-10 NOTE — Telephone Encounter (Signed)
Faxed to [facility]

## 2022-08-10 NOTE — Telephone Encounter (Signed)
Patient niece Lurena Joiner called said forgot to mention to the provider to send in Shower chair or bench seat and a bedside commode to fix over the toilet.   Pharmacy: [facility]

## 2022-08-15 DIAGNOSIS — Z7984 Long term (current) use of oral hypoglycemic drugs: Secondary | ICD-10-CM | POA: Diagnosis not present

## 2022-08-15 DIAGNOSIS — I110 Hypertensive heart disease with heart failure: Secondary | ICD-10-CM | POA: Diagnosis not present

## 2022-08-15 DIAGNOSIS — M6281 Muscle weakness (generalized): Secondary | ICD-10-CM | POA: Diagnosis not present

## 2022-08-15 DIAGNOSIS — J9601 Acute respiratory failure with hypoxia: Secondary | ICD-10-CM | POA: Diagnosis not present

## 2022-08-15 DIAGNOSIS — D649 Anemia, unspecified: Secondary | ICD-10-CM | POA: Diagnosis not present

## 2022-08-15 DIAGNOSIS — E1165 Type 2 diabetes mellitus with hyperglycemia: Secondary | ICD-10-CM | POA: Diagnosis not present

## 2022-08-15 DIAGNOSIS — C50112 Malignant neoplasm of central portion of left female breast: Secondary | ICD-10-CM | POA: Diagnosis not present

## 2022-08-15 DIAGNOSIS — G9341 Metabolic encephalopathy: Secondary | ICD-10-CM | POA: Diagnosis not present

## 2022-08-15 DIAGNOSIS — E876 Hypokalemia: Secondary | ICD-10-CM | POA: Diagnosis not present

## 2022-08-15 DIAGNOSIS — Z7901 Long term (current) use of anticoagulants: Secondary | ICD-10-CM | POA: Diagnosis not present

## 2022-08-15 DIAGNOSIS — N390 Urinary tract infection, site not specified: Secondary | ICD-10-CM | POA: Diagnosis not present

## 2022-08-15 DIAGNOSIS — E1143 Type 2 diabetes mellitus with diabetic autonomic (poly)neuropathy: Secondary | ICD-10-CM | POA: Diagnosis not present

## 2022-08-15 DIAGNOSIS — I509 Heart failure, unspecified: Secondary | ICD-10-CM | POA: Diagnosis not present

## 2022-08-15 DIAGNOSIS — I482 Chronic atrial fibrillation, unspecified: Secondary | ICD-10-CM | POA: Diagnosis not present

## 2022-08-16 ENCOUNTER — Other Ambulatory Visit: Payer: Self-pay

## 2022-08-16 ENCOUNTER — Telehealth: Payer: Self-pay | Admitting: Internal Medicine

## 2022-08-16 DIAGNOSIS — D649 Anemia, unspecified: Secondary | ICD-10-CM | POA: Diagnosis not present

## 2022-08-16 DIAGNOSIS — G9341 Metabolic encephalopathy: Secondary | ICD-10-CM | POA: Diagnosis not present

## 2022-08-16 DIAGNOSIS — C50112 Malignant neoplasm of central portion of left female breast: Secondary | ICD-10-CM | POA: Diagnosis not present

## 2022-08-16 DIAGNOSIS — N390 Urinary tract infection, site not specified: Secondary | ICD-10-CM | POA: Diagnosis not present

## 2022-08-16 DIAGNOSIS — M6281 Muscle weakness (generalized): Secondary | ICD-10-CM | POA: Diagnosis not present

## 2022-08-16 DIAGNOSIS — I110 Hypertensive heart disease with heart failure: Secondary | ICD-10-CM | POA: Diagnosis not present

## 2022-08-16 DIAGNOSIS — Z7901 Long term (current) use of anticoagulants: Secondary | ICD-10-CM | POA: Diagnosis not present

## 2022-08-16 DIAGNOSIS — Z7984 Long term (current) use of oral hypoglycemic drugs: Secondary | ICD-10-CM | POA: Diagnosis not present

## 2022-08-16 DIAGNOSIS — I509 Heart failure, unspecified: Secondary | ICD-10-CM | POA: Diagnosis not present

## 2022-08-16 DIAGNOSIS — E1143 Type 2 diabetes mellitus with diabetic autonomic (poly)neuropathy: Secondary | ICD-10-CM | POA: Diagnosis not present

## 2022-08-16 DIAGNOSIS — E1165 Type 2 diabetes mellitus with hyperglycemia: Secondary | ICD-10-CM | POA: Diagnosis not present

## 2022-08-16 DIAGNOSIS — E876 Hypokalemia: Secondary | ICD-10-CM | POA: Diagnosis not present

## 2022-08-16 DIAGNOSIS — J9601 Acute respiratory failure with hypoxia: Secondary | ICD-10-CM | POA: Diagnosis not present

## 2022-08-16 DIAGNOSIS — I482 Chronic atrial fibrillation, unspecified: Secondary | ICD-10-CM | POA: Diagnosis not present

## 2022-08-16 MED ORDER — UNABLE TO FIND: 1.0000 | Freq: Every day | 0 refills | Status: DC

## 2022-08-16 NOTE — Telephone Encounter (Signed)
Patient needs orders for shower chair and commode sent to [facility] instead of [address]

## 2022-08-16 NOTE — Telephone Encounter (Signed)
Faxed to [facility] 

## 2022-08-22 ENCOUNTER — Telehealth: Payer: Self-pay | Admitting: Internal Medicine

## 2022-08-22 ENCOUNTER — Other Ambulatory Visit: Payer: Self-pay

## 2022-08-22 MED ORDER — UNABLE TO FIND: 1.0000 | Freq: Every day | 0 refills | Status: DC

## 2022-08-22 NOTE — Telephone Encounter (Signed)
Called in regard to previous tele messages.  [facility] has received orders for shower chair.  Still needing orders for Commode Seat for over toilet.

## 2022-08-22 NOTE — Telephone Encounter (Signed)
Faxed to [facility] 

## 2022-08-22 NOTE — Telephone Encounter (Signed)
Faxed to [facility] 

## 2022-08-22 NOTE — Telephone Encounter (Signed)
Tammy called from DME at [facility] needs a height / weight / dx code for shower chair fax to [phone] [facility].

## 2022-08-23 ENCOUNTER — Telehealth: Payer: Self-pay | Admitting: Internal Medicine

## 2022-08-23 ENCOUNTER — Encounter: Payer: Self-pay | Admitting: Family Medicine

## 2022-08-23 ENCOUNTER — Telehealth (INDEPENDENT_AMBULATORY_CARE_PROVIDER_SITE_OTHER): Payer: 59 | Admitting: Family Medicine

## 2022-08-23 VITALS — Ht 64.0 in | Wt 140.0 lb

## 2022-08-23 DIAGNOSIS — I110 Hypertensive heart disease with heart failure: Secondary | ICD-10-CM | POA: Diagnosis not present

## 2022-08-23 DIAGNOSIS — G9341 Metabolic encephalopathy: Secondary | ICD-10-CM | POA: Diagnosis not present

## 2022-08-23 DIAGNOSIS — Z7901 Long term (current) use of anticoagulants: Secondary | ICD-10-CM | POA: Diagnosis not present

## 2022-08-23 DIAGNOSIS — D649 Anemia, unspecified: Secondary | ICD-10-CM | POA: Diagnosis not present

## 2022-08-23 DIAGNOSIS — E1143 Type 2 diabetes mellitus with diabetic autonomic (poly)neuropathy: Secondary | ICD-10-CM | POA: Diagnosis not present

## 2022-08-23 DIAGNOSIS — Z7984 Long term (current) use of oral hypoglycemic drugs: Secondary | ICD-10-CM | POA: Diagnosis not present

## 2022-08-23 DIAGNOSIS — M6281 Muscle weakness (generalized): Secondary | ICD-10-CM | POA: Diagnosis not present

## 2022-08-23 DIAGNOSIS — I482 Chronic atrial fibrillation, unspecified: Secondary | ICD-10-CM | POA: Diagnosis not present

## 2022-08-23 DIAGNOSIS — I509 Heart failure, unspecified: Secondary | ICD-10-CM | POA: Diagnosis not present

## 2022-08-23 DIAGNOSIS — E1165 Type 2 diabetes mellitus with hyperglycemia: Secondary | ICD-10-CM | POA: Diagnosis not present

## 2022-08-23 DIAGNOSIS — C50112 Malignant neoplasm of central portion of left female breast: Secondary | ICD-10-CM | POA: Diagnosis not present

## 2022-08-23 DIAGNOSIS — N390 Urinary tract infection, site not specified: Secondary | ICD-10-CM | POA: Diagnosis not present

## 2022-08-23 DIAGNOSIS — J9601 Acute respiratory failure with hypoxia: Secondary | ICD-10-CM | POA: Diagnosis not present

## 2022-08-23 DIAGNOSIS — E876 Hypokalemia: Secondary | ICD-10-CM | POA: Diagnosis not present

## 2022-08-23 MED ORDER — CEPHALEXIN 500 MG PO CAPS: 500.0000 mg | ORAL_CAPSULE | Freq: Three times a day (TID) | ORAL | 0 refills | Status: AC

## 2022-08-23 NOTE — Assessment & Plan Note (Signed)
Keflex 500 mg x 5 days Urinalysis, and urine culture ordered- Awaiting results will follow up. May take OTC AZO for urinary pain relief. Explained to increase oral fluid intake. Drink 8 glasses of water daily.  Follow up for worsening or persistent symptoms. Patient verbalizes understanding regarding plan of care and all questions answered.

## 2022-08-23 NOTE — Progress Notes (Signed)
Virtual Visit via Video Note  I connected with Lisa Thornton on 08/23/22 at  9:20 AM EDT by a video enabled telemedicine application and verified that I am speaking with the correct person using two identifiers.  Patient Location: Home Provider Location: Office/Clinic  I discussed the limitations, risks, security, and privacy concerns of performing an evaluation and management service by video and the availability of in person appointments. I also discussed with the patient that there may be a patient responsible charge related to this service. The patient expressed understanding and agreed to proceed.  Subjective: PCP: Anabel Halon, MD  Chief Complaint  Patient presents with   Dysuria    Patient complains of flank pain, dysuria and fever for 6 days.    Urinary Tract Infection  This is a new problem symptoms started last Thursday  Pain occurs every urination and has been gradually worsening. The quality of the pain is described as burning. The pain is at a severity of 6/10. The pain is moderate. The maximum temperature recorded prior to her arrival was 100 - 100.9 F. She is Not sexually active. There is No history of pyelonephritis. Associated symptoms include flank pain, frequency and urgency. Pertinent negatives include no hematuria or vomiting. She has tried increased fluids for the symptoms.       ROS: Per HPI  Current Outpatient Medications:    Accu-Chek Softclix Lancets lancets, USE 1  TO CHECK GLUCOSE UP TO 4 TIMES DAILY, Disp: 100 each, Rfl: 0   Acetaminophen-DM (CORICIDIN HBP COLD/COUGH/FLU PO), Take 1 tablet by mouth daily as needed (cough)., Disp: , Rfl:    anastrozole (ARIMIDEX) 1 MG tablet, Take 1 tablet by mouth once daily, Disp: 90 tablet, Rfl: 0   apixaban (ELIQUIS) 5 MG TABS tablet, Take 1 tablet (5 mg total) by mouth 2 (two) times daily., Disp: 180 tablet, Rfl: 3   blood glucose meter kit and supplies, Dispense based on patient and insurance preference. Use up  to four times daily as directed. (FOR ICD-10 E10.9, E11.9)., Disp: 1 each, Rfl: 0   calcium carbonate (OS-CAL) 600 MG TABS tablet, Take 600 mg by mouth daily with breakfast., Disp: , Rfl:    cephALEXin (KEFLEX) 500 MG capsule, Take 1 capsule (500 mg total) by mouth 3 (three) times daily for 5 days., Disp: 15 capsule, Rfl: 0   cholecalciferol (VITAMIN D3) 25 MCG (1000 UT) tablet, Take 1,000 Units by mouth 2 (two) times daily. , Disp: , Rfl:    furosemide (LASIX) 40 MG tablet, Take 1 tablet (40 mg total) by mouth daily., Disp: 30 tablet, Rfl:    glucose blood (ACCU-CHEK GUIDE) test strip, USE 1 STRIP TO CHECK GLUCOSE THREE TIMES DAILY IN THE MORNING, NOON, AND AT BEDTIME AS DIRECTED, Disp: 100 each, Rfl: 0   magnesium oxide (MAG-OX) 400 (240 Mg) MG tablet, Take 1 tablet (400 mg total) by mouth daily., Disp: , Rfl:    metFORMIN (GLUCOPHAGE) 1000 MG tablet, TAKE 1 TABLET BY MOUTH BEFORE BREAKFAST (Patient taking differently: Take 1,000 mg by mouth daily with breakfast.), Disp: 90 tablet, Rfl: 0   metoprolol tartrate (LOPRESSOR) 50 MG tablet, Take 1 tablet by mouth twice daily, Disp: 180 tablet, Rfl: 0   Omega-3 Fatty Acids (FISH OIL) 1000 MG CAPS, Take 1 capsule by mouth 2 (two) times daily., Disp: , Rfl:    rosuvastatin (CRESTOR) 5 MG tablet, Take 1 tablet (5 mg total) by mouth daily., Disp: 90 tablet, Rfl: 3   Semaglutide (  RYBELSUS) 7 MG TABS, Take 1 tablet (7 mg total) by mouth daily., Disp: 90 tablet, Rfl: 1   UNABLE TO FIND, 1 each by Does not apply route daily. Med Name: Shower Chair, Disp: 1 each, Rfl: 0   UNABLE TO FIND, 1 each by Does not apply route daily. Med Name: Bed side Commode to fix over toilet seat, Disp: 1 each, Rfl: 0  Observations/Objective: Today's Vitals   08/23/22 0922  Weight: 140 lb (63.5 kg)  Height: 5\' 4"  (1.626 m)   Physical Exam Telehealth visit, Patient is alert and no acute distress noted.   Assessment and Plan: Urinary tract infection without hematuria, site  unspecified Assessment & Plan: Keflex 500 mg x 5 days Urinalysis, and urine culture ordered- Awaiting results will follow up. May take OTC AZO for urinary pain relief. Explained to increase oral fluid intake. Drink 8 glasses of water daily.  Follow up for worsening or persistent symptoms. Patient verbalizes understanding regarding plan of care and all questions answered.   Orders: -     POCT urinalysis dipstick -     Urine Culture  Other orders -     Cephalexin; Take 1 capsule (500 mg total) by mouth 3 (three) times daily for 5 days.  Dispense: 15 capsule; Refill: 0    Follow Up Instructions: No follow-ups on file.   I discussed the assessment and treatment plan with the patient. The patient was provided an opportunity to ask questions, and all were answered. The patient agreed with the plan and demonstrated an understanding of the instructions.   The patient was advised to call back or seek an in-person evaluation if the symptoms worsen or if the condition fails to improve as anticipated.  The above assessment and management plan was discussed with the patient. The patient verbalized understanding of and has agreed to the management plan.   Cruzita Lederer Newman Nip, FNP

## 2022-08-23 NOTE — Telephone Encounter (Signed)
Pt called and requested a refill on the following medication. Please follow up with Nurse,[phone] .She said pt also needed Atorvastatin but I do not see that on her med list.   furosemide (LASIX) 40 MG tablet [[id number]]

## 2022-08-24 ENCOUNTER — Other Ambulatory Visit: Payer: Self-pay

## 2022-08-24 ENCOUNTER — Encounter (INDEPENDENT_AMBULATORY_CARE_PROVIDER_SITE_OTHER): Payer: 59 | Admitting: Cardiology

## 2022-08-24 DIAGNOSIS — G4733 Obstructive sleep apnea (adult) (pediatric): Secondary | ICD-10-CM

## 2022-08-24 MED ORDER — ATORVASTATIN CALCIUM 10 MG PO TABS: 10.0000 mg | ORAL_TABLET | Freq: Every day | ORAL | 0 refills | Status: DC

## 2022-08-24 MED ORDER — UNABLE TO FIND: 1.0000 | Freq: Every day | 0 refills | Status: DC

## 2022-08-24 MED ORDER — FUROSEMIDE 40 MG PO TABS: 40.0000 mg | ORAL_TABLET | Freq: Every day | ORAL | Status: DC

## 2022-08-24 NOTE — Telephone Encounter (Signed)
Refills sent to pharmacy, patient advised. 

## 2022-08-24 NOTE — Telephone Encounter (Signed)
Called in regards to previous encounter. Said the Commode seat has not been received. Please resend.

## 2022-08-24 NOTE — Telephone Encounter (Signed)
FAXED FOR THE THIRD TIME!

## 2022-08-25 ENCOUNTER — Other Ambulatory Visit: Payer: Self-pay

## 2022-08-25 ENCOUNTER — Telehealth: Payer: Self-pay | Admitting: Family Medicine

## 2022-08-25 ENCOUNTER — Telehealth: Payer: Self-pay | Admitting: Internal Medicine

## 2022-08-25 DIAGNOSIS — R269 Unspecified abnormalities of gait and mobility: Secondary | ICD-10-CM | POA: Diagnosis not present

## 2022-08-25 DIAGNOSIS — N390 Urinary tract infection, site not specified: Secondary | ICD-10-CM | POA: Diagnosis not present

## 2022-08-25 DIAGNOSIS — B351 Tinea unguium: Secondary | ICD-10-CM | POA: Diagnosis not present

## 2022-08-25 MED ORDER — FUROSEMIDE 40 MG PO TABS: 40.0000 mg | ORAL_TABLET | Freq: Every day | ORAL | 0 refills | Status: DC

## 2022-08-25 NOTE — Telephone Encounter (Signed)
[facility] for all Prescriptions.

## 2022-08-25 NOTE — Telephone Encounter (Signed)
Patient just dropped off urine in lab

## 2022-08-25 NOTE — Telephone Encounter (Signed)
Order given to the lab.

## 2022-08-25 NOTE — Telephone Encounter (Signed)
Called patient to verify which pharmacy they need it to go to. Left vm.

## 2022-08-25 NOTE — Telephone Encounter (Signed)
Pt called and requested a refill. She stated it was sent to the wrong phar   furosemide (LASIX) 40 MG tablet [[id number]]

## 2022-08-25 NOTE — Telephone Encounter (Signed)
Refill sent to [facility] in   °

## 2022-08-28 LAB — URINE CULTURE

## 2022-08-30 ENCOUNTER — Ambulatory Visit (HOSPITAL_COMMUNITY)
Admission: RE | Admit: 2022-08-30 | Discharge: 2022-08-30 | Disposition: A | Discharge status: Home / Self Care | Payer: 59 | Source: Ambulatory Visit | Attending: Internal Medicine | Admitting: Internal Medicine

## 2022-08-30 DIAGNOSIS — I480 Paroxysmal atrial fibrillation: Secondary | ICD-10-CM | POA: Insufficient documentation

## 2022-08-30 LAB — ECHOCARDIOGRAM LIMITED
Calc EF: 39.6 %
S' Lateral: 3.3 cm
Single Plane A2C EF: 42.2 %
Single Plane A4C EF: 35.7 %

## 2022-08-30 NOTE — Progress Notes (Signed)
  Echocardiogram 2D Echocardiogram has been performed.  Lisa Thornton 08/30/2022, 1:46 PM

## 2022-08-31 NOTE — Telephone Encounter (Signed)
error 

## 2022-09-01 DIAGNOSIS — E1143 Type 2 diabetes mellitus with diabetic autonomic (poly)neuropathy: Secondary | ICD-10-CM | POA: Diagnosis not present

## 2022-09-01 DIAGNOSIS — G9341 Metabolic encephalopathy: Secondary | ICD-10-CM | POA: Diagnosis not present

## 2022-09-01 DIAGNOSIS — D649 Anemia, unspecified: Secondary | ICD-10-CM | POA: Diagnosis not present

## 2022-09-01 DIAGNOSIS — Z7901 Long term (current) use of anticoagulants: Secondary | ICD-10-CM | POA: Diagnosis not present

## 2022-09-01 DIAGNOSIS — Z7984 Long term (current) use of oral hypoglycemic drugs: Secondary | ICD-10-CM | POA: Diagnosis not present

## 2022-09-01 DIAGNOSIS — E876 Hypokalemia: Secondary | ICD-10-CM | POA: Diagnosis not present

## 2022-09-01 DIAGNOSIS — I509 Heart failure, unspecified: Secondary | ICD-10-CM | POA: Diagnosis not present

## 2022-09-01 DIAGNOSIS — J9601 Acute respiratory failure with hypoxia: Secondary | ICD-10-CM | POA: Diagnosis not present

## 2022-09-01 DIAGNOSIS — N390 Urinary tract infection, site not specified: Secondary | ICD-10-CM | POA: Diagnosis not present

## 2022-09-01 DIAGNOSIS — C50112 Malignant neoplasm of central portion of left female breast: Secondary | ICD-10-CM | POA: Diagnosis not present

## 2022-09-01 DIAGNOSIS — M6281 Muscle weakness (generalized): Secondary | ICD-10-CM | POA: Diagnosis not present

## 2022-09-01 DIAGNOSIS — I482 Chronic atrial fibrillation, unspecified: Secondary | ICD-10-CM | POA: Diagnosis not present

## 2022-09-01 DIAGNOSIS — I110 Hypertensive heart disease with heart failure: Secondary | ICD-10-CM | POA: Diagnosis not present

## 2022-09-01 DIAGNOSIS — E1165 Type 2 diabetes mellitus with hyperglycemia: Secondary | ICD-10-CM | POA: Diagnosis not present

## 2022-09-04 NOTE — Procedures (Signed)
SLEEP STUDY REPORT Patient Information Study Date: 08/24/2022 Patient Name: Lisa Thornton Patient ID: [id number] Birth Date: 20-May-1955 Age: 67 Gender: Female BMI: 23.7 (W=139 lb, H=5' 4'') Stopbang: 4 Referring Physician: Luane School, MD  TEST DESCRIPTION: Home sleep apnea testing was completed using the WatchPat, a Type 1 device, utilizing peripheral arterial tonometry (PAT), chest movement, actigraphy, pulse oximetry, pulse rate, body position and snore. AHI was calculated with apnea and hypopnea using valid sleep time as the denominator. RDI includes apneas, hypopneas, and RERAs. The data acquired and the scoring of sleep and all associated events were performed in accordance with the recommended standards and specifications as outlined in the AASM Manual for the Scoring of Sleep and Associated Events 2.2.0 (2015).   FINDINGS:   1. Mild Obstructive Sleep Apnea with AHI 8/hr.   2. No Central Sleep Apnea with pAHIc 1.8/hr.   3. Oxygen desaturations as low as 84%.   4. Mild snoring was present. O2 sats were < 88% for 0 min.   5. Total sleep time was 6 hrs and 9 min.   6. 13.4% of total sleep time was spent in REM sleep.   7.  sleep onset latency at 6 min.   8.  REM sleep onset latency at 196 min.   9. Total awakenings were 2.  10. Arrhythmia detection:  Suggestive of possible brief atrial fibrillation lasting 5 hrs 44 min and 22 seconds.  This is not diagnostic and further testing with outpatient telemetry monitoring is recommended.  DIAGNOSIS: Mild Obstructive Sleep Apnea (G47.33) Possible Atrial Fibrillation  RECOMMENDATIONS:   1.  Clinical correlation of these findings is necessary.  The decision to treat obstructive sleep apnea (OSA) is usually based on the presence of apnea symptoms or the presence of associated medical conditions such as Hypertension, Congestive Heart Failure, Atrial Fibrillation or Obesity.  The most common symptoms of OSA are snoring, gasping for  breath while sleeping, daytime sleepiness and fatigue.   2.  Initiating apnea therapy is recommended given the presence of symptoms and/or associated conditions. Recommend proceeding with one of the following:     a.  Auto-CPAP therapy with a pressure range of 5-20cm H2O.     b.  An oral appliance (OA) that can be obtained from certain dentists with expertise in sleep medicine.  These are primarily of use in non-obese patients with mild and moderate disease.     c.  An ENT consultation which may be useful to look for specific causes of obstruction and possible treatment options.     d.  If patient is intolerant to PAP therapy, consider referral to ENT for evaluation for hypoglossal nerve stimulator.   3.  Close follow-up is necessary to ensure success with CPAP or oral appliance therapy for maximum benefit.  4.  A follow-up oximetry study on CPAP is recommended to assess the adequacy of therapy and determine the need for supplemental oxygen or the potential need for Bi-level therapy.  An arterial blood gas to determine the adequacy of baseline ventilation and oxygenation should also be considered.  5.  Healthy sleep recommendations include:  adequate nightly sleep (normal 7-9 hrs/night), avoidance of caffeine after noon and alcohol near bedtime, and maintaining a sleep environment that is cool, dark and quiet.  6.  Weight loss for overweight patients is recommended.  Even modest amounts of weight loss can significantly improve the severity of sleep apnea.  7.  Snoring recommendations include:  weight loss where appropriate, side sleeping, and  avoidance of alcohol before bed.  8.  Operation of motor vehicle should be avoided when sleepy.  Signature: Armanda Magic, MD; [facility]; Diplomat, American Board of Sleep Medicine Electronically Signed: 09/04/2022 5:24:19 PM

## 2022-09-05 DIAGNOSIS — Z7901 Long term (current) use of anticoagulants: Secondary | ICD-10-CM | POA: Diagnosis not present

## 2022-09-05 DIAGNOSIS — I509 Heart failure, unspecified: Secondary | ICD-10-CM | POA: Diagnosis not present

## 2022-09-05 DIAGNOSIS — G9341 Metabolic encephalopathy: Secondary | ICD-10-CM | POA: Diagnosis not present

## 2022-09-05 DIAGNOSIS — I110 Hypertensive heart disease with heart failure: Secondary | ICD-10-CM | POA: Diagnosis not present

## 2022-09-05 DIAGNOSIS — D649 Anemia, unspecified: Secondary | ICD-10-CM | POA: Diagnosis not present

## 2022-09-05 DIAGNOSIS — E876 Hypokalemia: Secondary | ICD-10-CM | POA: Diagnosis not present

## 2022-09-05 DIAGNOSIS — Z7984 Long term (current) use of oral hypoglycemic drugs: Secondary | ICD-10-CM | POA: Diagnosis not present

## 2022-09-05 DIAGNOSIS — J9601 Acute respiratory failure with hypoxia: Secondary | ICD-10-CM | POA: Diagnosis not present

## 2022-09-05 DIAGNOSIS — M6281 Muscle weakness (generalized): Secondary | ICD-10-CM | POA: Diagnosis not present

## 2022-09-05 DIAGNOSIS — E1165 Type 2 diabetes mellitus with hyperglycemia: Secondary | ICD-10-CM | POA: Diagnosis not present

## 2022-09-05 DIAGNOSIS — I482 Chronic atrial fibrillation, unspecified: Secondary | ICD-10-CM | POA: Diagnosis not present

## 2022-09-05 DIAGNOSIS — E1143 Type 2 diabetes mellitus with diabetic autonomic (poly)neuropathy: Secondary | ICD-10-CM | POA: Diagnosis not present

## 2022-09-05 DIAGNOSIS — N390 Urinary tract infection, site not specified: Secondary | ICD-10-CM | POA: Diagnosis not present

## 2022-09-05 DIAGNOSIS — C50112 Malignant neoplasm of central portion of left female breast: Secondary | ICD-10-CM | POA: Diagnosis not present

## 2022-09-06 ENCOUNTER — Ambulatory Visit (INDEPENDENT_AMBULATORY_CARE_PROVIDER_SITE_OTHER): Payer: 59 | Admitting: Internal Medicine

## 2022-09-06 ENCOUNTER — Encounter: Payer: Self-pay | Admitting: Internal Medicine

## 2022-09-06 VITALS — BP 106/64 | HR 78 | Resp 16 | Ht 64.0 in | Wt 143.0 lb

## 2022-09-06 DIAGNOSIS — Z7984 Long term (current) use of oral hypoglycemic drugs: Secondary | ICD-10-CM

## 2022-09-06 DIAGNOSIS — I429 Cardiomyopathy, unspecified: Secondary | ICD-10-CM

## 2022-09-06 DIAGNOSIS — I480 Paroxysmal atrial fibrillation: Secondary | ICD-10-CM | POA: Diagnosis not present

## 2022-09-06 DIAGNOSIS — I10 Essential (primary) hypertension: Secondary | ICD-10-CM

## 2022-09-06 DIAGNOSIS — N179 Acute kidney failure, unspecified: Secondary | ICD-10-CM

## 2022-09-06 DIAGNOSIS — G4733 Obstructive sleep apnea (adult) (pediatric): Secondary | ICD-10-CM

## 2022-09-06 DIAGNOSIS — E1140 Type 2 diabetes mellitus with diabetic neuropathy, unspecified: Secondary | ICD-10-CM | POA: Diagnosis not present

## 2022-09-06 MED ORDER — METOPROLOL TARTRATE 50 MG PO TABS: 50.0000 mg | ORAL_TABLET | Freq: Two times a day (BID) | ORAL | 1 refills | Status: DC

## 2022-09-06 MED ORDER — METFORMIN HCL 1000 MG PO TABS: 1000.0000 mg | ORAL_TABLET | Freq: Every day | ORAL | 1 refills | Status: DC

## 2022-09-06 NOTE — Progress Notes (Signed)
Established Patient Office Visit  Subjective:  Patient ID: Lisa Thornton, female    DOB: 1955/04/13  Age: 67 y.o. MRN: [id number]  CC:  Chief Complaint  Patient presents with   Diabetes    Follow up visit    Hypertension    HPI Lisa Thornton is a 67 y.o. female with past medical history of HTN, type 2 DM and breast ca s/p mastectomy and chemotherapy who presents for f/u of her chronic medical conditions.   Type II DM: Her last HbA1c was 6.4. Her blood glucose has been around 120-140 most of the time. Denies any polyuria or polyphagia. Denies any dysuria or hematuria.  HTN: BP is well-controlled. Takes medications regularly. Patient denies headache, dizziness, chest pain, dyspnea or palpitations.   Atrial fibrillation: She was found to have atrial fibrillation in the last year.  She was placed on Eliquis and was referred to cardiology. She denies any dizziness or palpitations currently.  Denies any active bleeding.    Past Medical History:  Diagnosis Date   Anemia 12/31/2017   Cancer (HCC)    left breast cancer   Chest pain    Diabetes mellitus without complication (HCC)    Hypertension     Past Surgical History:  Procedure Laterality Date   COLONOSCOPY WITH PROPOFOL N/A 09/27/2021   Procedure: COLONOSCOPY WITH PROPOFOL;  Surgeon: Dolores Frame, MD;  Location: [facility];  Service: Gastroenterology;  Laterality: N/A;  945   MASTECTOMY MODIFIED RADICAL Left 12/31/2017   Procedure: LEFT MODIFIED RADICAL MASTECTOMY;  Surgeon: Franky Macho, MD;  Location: [facility];  Service: General;  Laterality: Left;   POLYPECTOMY  09/27/2021   Procedure: POLYPECTOMY;  Surgeon: Dolores Frame, MD;  Location: [facility];  Service: Gastroenterology;;   PORTACATH PLACEMENT Right 06/27/2017   Procedure: INSERTION PORT-A-CATH;  Surgeon: Franky Macho, MD;  Location: [facility];  Service: General;  Laterality: Right;    Family History  Problem Relation Age of Onset   Breast  cancer Mother    Thyroid disease Mother    Heart disease Mother    Heart attack Father    Heart attack Sister    Hypertension Sister    Heart attack Brother    Cancer Sister    Stroke Brother    Alzheimer's disease Maternal Aunt     Social History   Socioeconomic History   Marital status: Divorced    Spouse name: Not on file   Number of children: 2   Years of education: 10   Highest education level: 10th grade  Occupational History   Occupation: Psychologist, 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  Tobacco Use   Smoking status: Never   Smokeless tobacco: Never  Vaping Use   Vaping Use: Never used  Substance and Sexual Activity   Alcohol use: No   Drug use: No   Sexual activity: Not Currently  Other Topics Concern   Not on file  Social History Narrative   Lives with sister   2 children   Dog: Cozie      Enjoys: puzzles, sewing, and walk      Diet: eats all food groups outside: leafy greens    Caffeine: limited, sweet tea   Water: 6-8 cups daily       No car; does have license, wears seat belt   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   [place] area    Social Determinants of Health   Financial Resource Strain: Low Risk  (03/29/2021)   Overall Physicist, medical Strain (  CARDIA)    Difficulty of Paying Living Expenses: Not hard at all  Food Insecurity: No Food Insecurity (07/03/2022)   Hunger Vital Sign    Worried About Running Out of Food in the Last Year: Never true    Ran Out of Food in the Last Year: Never true  Transportation Needs: No Transportation Needs (07/03/2022)   PRAPARE - Administrator, Civil Service (Medical): No    Lack of Transportation (Non-Medical): No  Physical Activity: Insufficiently Active (03/29/2021)   Exercise Vital Sign    Days of Exercise per Week: 7 days    Minutes of Exercise per Session: 20 min  Stress: No Stress Concern Present (03/29/2021)   [employer] of Occupational Health - Occupational Stress Questionnaire    Feeling of Stress : Not at all  Social  Connections: Socially Isolated (03/29/2021)   Social Connection and Isolation Panel [NHANES]    Frequency of Communication with Friends and Family: More than three times a week    Frequency of Social Gatherings with Friends and Family: Once a week    Attends Religious Services: Never    Database administrator or Organizations: No    Attends Banker Meetings: Never    Marital Status: Divorced  Catering manager Violence: Patient Unable To Answer (07/03/2022)   Humiliation, Afraid, Rape, and Kick questionnaire    Fear of Current or Ex-Partner: Patient unable to answer    Emotionally Abused: Patient unable to answer    Physically Abused: Patient unable to answer    Sexually Abused: Patient unable to answer    Outpatient Medications Prior to Visit  Medication Sig Dispense Refill   Accu-Chek Softclix Lancets lancets USE 1  TO CHECK GLUCOSE UP TO 4 TIMES DAILY 100 each 0   Acetaminophen-DM (CORICIDIN HBP COLD/COUGH/FLU PO) Take 1 tablet by mouth daily as needed (cough).     anastrozole (ARIMIDEX) 1 MG tablet Take 1 tablet by mouth once daily 90 tablet 0   apixaban (ELIQUIS) 5 MG TABS tablet Take 1 tablet (5 mg total) by mouth 2 (two) times daily. 180 tablet 3   atorvastatin (LIPITOR) 10 MG tablet Take 1 tablet (10 mg total) by mouth at bedtime. 90 tablet 0   blood glucose meter kit and supplies Dispense based on patient and insurance preference. Use up to four times daily as directed. (FOR ICD-10 E10.9, E11.9). 1 each 0   calcium carbonate (OS-CAL) 600 MG TABS tablet Take 600 mg by mouth daily with breakfast.     cholecalciferol (VITAMIN D3) 25 MCG (1000 UT) tablet Take 1,000 Units by mouth 2 (two) times daily.      glucose blood (ACCU-CHEK GUIDE) test strip USE 1 STRIP TO CHECK GLUCOSE THREE TIMES DAILY IN THE MORNING, NOON, AND AT BEDTIME AS DIRECTED 100 each 0   magnesium oxide (MAG-OX) 400 (240 Mg) MG tablet Take 1 tablet (400 mg total) by mouth daily.     Omega-3 Fatty Acids  (FISH OIL) 1000 MG CAPS Take 1 capsule by mouth 2 (two) times daily.     rosuvastatin (CRESTOR) 5 MG tablet Take 1 tablet (5 mg total) by mouth daily. 90 tablet 3   Semaglutide (RYBELSUS) 7 MG TABS Take 1 tablet (7 mg total) by mouth daily. 90 tablet 1   UNABLE TO FIND 1 each by Does not apply route daily. Med Name: Shower Chair 1 each 0   UNABLE TO FIND 1 each by Does not apply route daily. Med Name:  Bed side Commode to fix over toilet seat 1 each 0   furosemide (LASIX) 40 MG tablet Take 1 tablet (40 mg total) by mouth daily. 30 tablet 0   metFORMIN (GLUCOPHAGE) 1000 MG tablet TAKE 1 TABLET BY MOUTH BEFORE BREAKFAST (Patient taking differently: Take 1,000 mg by mouth daily with breakfast.) 90 tablet 0   metoprolol tartrate (LOPRESSOR) 50 MG tablet Take 1 tablet by mouth twice daily 180 tablet 0   No facility-administered medications prior to visit.    No Known Allergies  ROS Review of Systems  Constitutional:  Positive for fatigue. Negative for chills and fever.  HENT:  Negative for congestion, sinus pressure, sinus pain and sore throat.   Eyes:  Negative for pain and discharge.  Respiratory:  Negative for cough and shortness of breath.   Cardiovascular:  Negative for chest pain and palpitations.  Gastrointestinal:  Negative for abdominal pain, diarrhea, nausea and vomiting.  Endocrine: Negative for polydipsia and polyuria.  Genitourinary:  Negative for dysuria, frequency and hematuria.  Musculoskeletal:  Negative for neck pain and neck stiffness.  Skin:  Negative for rash.  Neurological:  Negative for dizziness and weakness.  Psychiatric/Behavioral:  Negative for agitation and behavioral problems.       Objective:    Physical Exam Vitals reviewed.  Constitutional:      General: She is not in acute distress.    Appearance: She is not diaphoretic.  HENT:     Head: Normocephalic and atraumatic.     Nose: Nose normal. No congestion.     Mouth/Throat:     Mouth: Mucous  membranes are moist.     Pharynx: No posterior oropharyngeal erythema.  Eyes:     General: No scleral icterus.    Extraocular Movements: Extraocular movements intact.  Neck:     Vascular: No carotid bruit.  Cardiovascular:     Rate and Rhythm: Normal rate. Rhythm irregular.     Pulses: Normal pulses.     Heart sounds: Normal heart sounds. No murmur heard. Pulmonary:     Breath sounds: Normal breath sounds. No wheezing or rales.  Musculoskeletal:     Cervical back: Neck supple. No tenderness.     Right lower leg: No edema.     Left lower leg: No edema.  Skin:    General: Skin is warm.     Findings: No rash.  Neurological:     General: No focal deficit present.     Mental Status: She is alert and oriented to person, place, and time.  Psychiatric:        Mood and Affect: Mood normal.        Behavior: Behavior normal.     BP 106/64   Pulse 78   Resp 16   Ht 5\' 4"  (1.626 m)   Wt 143 lb (64.9 kg)   SpO2 96%   BMI 24.55 kg/m  Wt Readings from Last 3 Encounters:  09/06/22 143 lb (64.9 kg)  08/23/22 140 lb (63.5 kg)  08/07/22 140 lb 6.4 oz (63.7 kg)    Lab Results  Component Value Date   TSH 1.480 12/12/2021   Lab Results  Component Value Date   WBC 10.9 (H) 08/07/2022   HGB 11.5 08/07/2022   HCT 36.0 08/07/2022   MCV 93 08/07/2022   PLT 202 08/07/2022   Lab Results  Component Value Date   NA 143 09/06/2022   K 4.0 09/06/2022   CO2 28 09/06/2022   GLUCOSE 139 (H) 09/06/2022  BUN 27 09/06/2022   CREATININE 1.53 (H) 09/06/2022   BILITOT 0.7 07/06/2022   ALKPHOS 62 07/06/2022   AST 26 07/06/2022   ALT 16 07/06/2022   PROT 6.9 07/06/2022   ALBUMIN 2.1 (L) 07/06/2022   CALCIUM 10.7 (H) 09/06/2022   ANIONGAP 6 07/10/2022   EGFR 37 (L) 09/06/2022   Lab Results  Component Value Date   CHOL 212 (H) 12/12/2021   Lab Results  Component Value Date   HDL 39 (L) 12/12/2021   Lab Results  Component Value Date   LDLCALC 136 (H) 12/12/2021   Lab Results   Component Value Date   TRIG 203 (H) 12/12/2021   Lab Results  Component Value Date   CHOLHDL 5.4 (H) 12/12/2021   Lab Results  Component Value Date   HGBA1C 6.4 (H) 08/07/2022      Assessment & Plan:   Problem List Items Addressed This Visit       Cardiovascular and Mediastinum   Atrial fibrillation (HCC) (Chronic)    CHADS-VASc - 4.  On Eliquis for [facility] Continue metoprolol for rate control Followed by cardiology      Relevant Medications   metoprolol tartrate (LOPRESSOR) 50 MG tablet   Cardiomyopathy (HCC) (Chronic)    Echocardiogram during hospital course showed LVEF of 45 to 50% Followed by cardiology Continue Lasix, but due to recent AKI, advised to decrease dose of Lasix to 20 mg QD Appears euvolemic today      Relevant Medications   metoprolol tartrate (LOPRESSOR) 50 MG tablet   Essential hypertension    BP Readings from Last 1 Encounters:  09/06/22 106/64  Well-controlled with metoprolol Recently discontinued amlodipine, HCTZ due to hypotension and AKI Counseled for compliance with the medications Advised DASH diet and moderate exercise/walking as tolerated      Relevant Medications   metoprolol tartrate (LOPRESSOR) 50 MG tablet     Respiratory   OSA (obstructive sleep apnea)    Recent sleep study showed mild OSA Awaiting CPAP device - followed by cardiology        Endocrine   Type 2 diabetes mellitus with diabetic neuropathy, unspecified (HCC) - Primary (Chronic)    Lab Results  Component Value Date   HGBA1C 6.4 (H) 08/07/2022  Well-controlled Associated with HTN and HLD On Metformin 1000 mg QD On Rybelsus 7 mg QD Advised to follow diabetic diet On statin F/u CMP and lipid panel Diabetic eye exam: Advised to follow up with Ophthalmology for diabetic eye exam  Mild numbness of LE likely due to diabetic neuropathy, avoid adding gabapentin due to sedative effect for now      Relevant Medications   metFORMIN (GLUCOPHAGE) 1000 MG tablet      Genitourinary   AKI (acute kidney injury) (HCC)    Recent decrease GFR likely due to poor p.o. intake of fluids Decreased dose of Lasix to 20 mg daily Needs to maintain adequate hydration      Relevant Orders   Basic Metabolic Panel (BMET) (Completed)    Meds ordered this encounter  Medications   metFORMIN (GLUCOPHAGE) 1000 MG tablet    Sig: Take 1 tablet (1,000 mg total) by mouth daily with breakfast.    Dispense:  90 tablet    Refill:  1   metoprolol tartrate (LOPRESSOR) 50 MG tablet    Sig: Take 1 tablet (50 mg total) by mouth 2 (two) times daily.    Dispense:  180 tablet    Refill:  1    Follow-up: Return  in about 4 months (around 01/06/2023) for Annual physical.    Anabel Halon, MD

## 2022-09-06 NOTE — Assessment & Plan Note (Signed)
BP Readings from Last 1 Encounters:  09/06/22 106/64   Well-controlled with metoprolol Recently discontinued amlodipine, HCTZ due to hypotension and AKI Counseled for compliance with the medications Advised DASH diet and moderate exercise/walking as tolerated

## 2022-09-06 NOTE — Patient Instructions (Signed)
Please continue to take medications as prescribed.  Please continue to follow low carb diet and perform moderate exercise/walking at least 150 mins/week.  Please maintain at least 64 ounces of fluid intake in a day.

## 2022-09-06 NOTE — Assessment & Plan Note (Addendum)
Lab Results  Component Value Date   HGBA1C 6.4 (H) 08/07/2022   Well-controlled Associated with HTN and HLD On Metformin 1000 mg QD On Rybelsus 7 mg QD Advised to follow diabetic diet On statin F/u CMP and lipid panel Diabetic eye exam: Advised to follow up with Ophthalmology for diabetic eye exam  Mild numbness of LE likely due to diabetic neuropathy, avoid adding gabapentin due to sedative effect for now

## 2022-09-06 NOTE — Assessment & Plan Note (Signed)
CHADS-VASc - 4.  On Eliquis for AC Continue metoprolol for rate control Followed by cardiology 

## 2022-09-07 ENCOUNTER — Other Ambulatory Visit: Payer: Self-pay | Admitting: Internal Medicine

## 2022-09-07 DIAGNOSIS — M7989 Other specified soft tissue disorders: Secondary | ICD-10-CM

## 2022-09-07 DIAGNOSIS — I429 Cardiomyopathy, unspecified: Secondary | ICD-10-CM

## 2022-09-07 DIAGNOSIS — I5032 Chronic diastolic (congestive) heart failure: Secondary | ICD-10-CM

## 2022-09-07 LAB — BASIC METABOLIC PANEL
BUN/Creatinine Ratio: 18 (ref 12–28)
BUN: 27 mg/dL (ref 8–27)
CO2: 28 mmol/L (ref 20–29)
Calcium: 10.7 mg/dL — ABNORMAL HIGH (ref 8.7–10.3)
Chloride: 98 mmol/L (ref 96–106)
Creatinine, Ser: 1.53 mg/dL — ABNORMAL HIGH (ref 0.57–1.00)
Glucose: 139 mg/dL — ABNORMAL HIGH (ref 70–99)
Potassium: 4 mmol/L (ref 3.5–5.2)
Sodium: 143 mmol/L (ref 134–144)
eGFR: 37 mL/min/{1.73_m2} — ABNORMAL LOW (ref >59)

## 2022-09-07 MED ORDER — FUROSEMIDE 40 MG PO TABS
20.0000 mg | ORAL_TABLET | Freq: Every day | ORAL | 2 refills | Status: DC
Start: 2022-09-07 — End: 2022-11-16

## 2022-09-08 ENCOUNTER — Ambulatory Visit: Payer: 59 | Attending: Internal Medicine

## 2022-09-08 DIAGNOSIS — I4819 Other persistent atrial fibrillation: Secondary | ICD-10-CM

## 2022-09-08 NOTE — Assessment & Plan Note (Addendum)
Recent sleep study showed mild OSA Awaiting CPAP device - followed by cardiology

## 2022-09-08 NOTE — Assessment & Plan Note (Signed)
Echocardiogram during hospital course showed LVEF of 45 to 50% Followed by cardiology Continue Lasix, but due to recent AKI, advised to decrease dose of Lasix to 20 mg QD Appears euvolemic today

## 2022-09-08 NOTE — Assessment & Plan Note (Signed)
Recent decrease GFR likely due to poor p.o. intake of fluids Decreased dose of Lasix to 20 mg daily Needs to maintain adequate hydration

## 2022-09-12 DIAGNOSIS — Z7984 Long term (current) use of oral hypoglycemic drugs: Secondary | ICD-10-CM | POA: Diagnosis not present

## 2022-09-12 DIAGNOSIS — I482 Chronic atrial fibrillation, unspecified: Secondary | ICD-10-CM | POA: Diagnosis not present

## 2022-09-12 DIAGNOSIS — Z7901 Long term (current) use of anticoagulants: Secondary | ICD-10-CM | POA: Diagnosis not present

## 2022-09-12 DIAGNOSIS — E1143 Type 2 diabetes mellitus with diabetic autonomic (poly)neuropathy: Secondary | ICD-10-CM | POA: Diagnosis not present

## 2022-09-12 DIAGNOSIS — E876 Hypokalemia: Secondary | ICD-10-CM | POA: Diagnosis not present

## 2022-09-12 DIAGNOSIS — N390 Urinary tract infection, site not specified: Secondary | ICD-10-CM | POA: Diagnosis not present

## 2022-09-12 DIAGNOSIS — I509 Heart failure, unspecified: Secondary | ICD-10-CM | POA: Diagnosis not present

## 2022-09-12 DIAGNOSIS — I110 Hypertensive heart disease with heart failure: Secondary | ICD-10-CM | POA: Diagnosis not present

## 2022-09-12 DIAGNOSIS — C50112 Malignant neoplasm of central portion of left female breast: Secondary | ICD-10-CM | POA: Diagnosis not present

## 2022-09-12 DIAGNOSIS — G9341 Metabolic encephalopathy: Secondary | ICD-10-CM | POA: Diagnosis not present

## 2022-09-12 DIAGNOSIS — D649 Anemia, unspecified: Secondary | ICD-10-CM | POA: Diagnosis not present

## 2022-09-12 DIAGNOSIS — E1165 Type 2 diabetes mellitus with hyperglycemia: Secondary | ICD-10-CM | POA: Diagnosis not present

## 2022-09-12 DIAGNOSIS — M6281 Muscle weakness (generalized): Secondary | ICD-10-CM | POA: Diagnosis not present

## 2022-09-12 DIAGNOSIS — J9601 Acute respiratory failure with hypoxia: Secondary | ICD-10-CM | POA: Diagnosis not present

## 2022-09-13 DIAGNOSIS — I110 Hypertensive heart disease with heart failure: Secondary | ICD-10-CM | POA: Diagnosis not present

## 2022-09-13 DIAGNOSIS — E876 Hypokalemia: Secondary | ICD-10-CM | POA: Diagnosis not present

## 2022-09-13 DIAGNOSIS — D649 Anemia, unspecified: Secondary | ICD-10-CM | POA: Diagnosis not present

## 2022-09-13 DIAGNOSIS — Z7984 Long term (current) use of oral hypoglycemic drugs: Secondary | ICD-10-CM | POA: Diagnosis not present

## 2022-09-13 DIAGNOSIS — I482 Chronic atrial fibrillation, unspecified: Secondary | ICD-10-CM | POA: Diagnosis not present

## 2022-09-13 DIAGNOSIS — I509 Heart failure, unspecified: Secondary | ICD-10-CM | POA: Diagnosis not present

## 2022-09-13 DIAGNOSIS — E1165 Type 2 diabetes mellitus with hyperglycemia: Secondary | ICD-10-CM | POA: Diagnosis not present

## 2022-09-13 DIAGNOSIS — J9601 Acute respiratory failure with hypoxia: Secondary | ICD-10-CM | POA: Diagnosis not present

## 2022-09-13 DIAGNOSIS — N390 Urinary tract infection, site not specified: Secondary | ICD-10-CM | POA: Diagnosis not present

## 2022-09-13 DIAGNOSIS — G9341 Metabolic encephalopathy: Secondary | ICD-10-CM | POA: Diagnosis not present

## 2022-09-13 DIAGNOSIS — M6281 Muscle weakness (generalized): Secondary | ICD-10-CM | POA: Diagnosis not present

## 2022-09-13 DIAGNOSIS — C50112 Malignant neoplasm of central portion of left female breast: Secondary | ICD-10-CM | POA: Diagnosis not present

## 2022-09-13 DIAGNOSIS — Z7901 Long term (current) use of anticoagulants: Secondary | ICD-10-CM | POA: Diagnosis not present

## 2022-09-13 DIAGNOSIS — E1143 Type 2 diabetes mellitus with diabetic autonomic (poly)neuropathy: Secondary | ICD-10-CM | POA: Diagnosis not present

## 2022-09-14 ENCOUNTER — Telehealth: Payer: Self-pay | Admitting: *Deleted

## 2022-09-14 DIAGNOSIS — I10 Essential (primary) hypertension: Secondary | ICD-10-CM

## 2022-09-14 DIAGNOSIS — G4733 Obstructive sleep apnea (adult) (pediatric): Secondary | ICD-10-CM

## 2022-09-14 DIAGNOSIS — I5032 Chronic diastolic (congestive) heart failure: Secondary | ICD-10-CM

## 2022-09-14 NOTE — Telephone Encounter (Signed)
-----   Message from Quintella Reichert, MD sent at 09/04/2022  5:26 PM EDT ----- Please let patient know that they have sleep apnea and recommend treating with CPAP.  Please order an auto CPAP from 4-15cm H2O with heated humidity and mask of choice.  Order overnight pulse ox on CPAP.  Followup with me in 6 weeks.

## 2022-09-14 NOTE — Telephone Encounter (Signed)
The patient has been notified of the result and verbalized understanding.  All questions (if any) were answered. Latrelle Dodrill, CMA 09/14/2022 5:18 PM    Upon patient request DME selection is [facility] Patient understands he will be contacted by [facility] to set up his cpap. Patient understands to call if [facility] does not contact him with new setup in a timely manner. Patient understands they will be called once confirmation has been received from [facility] that they have received their new machine to schedule 10 week follow up appointment.   [facility] notified of new cpap order  Please add to airview Patient was grateful for the call and thanked me

## 2022-09-20 DIAGNOSIS — E1143 Type 2 diabetes mellitus with diabetic autonomic (poly)neuropathy: Secondary | ICD-10-CM | POA: Diagnosis not present

## 2022-09-20 DIAGNOSIS — N390 Urinary tract infection, site not specified: Secondary | ICD-10-CM | POA: Diagnosis not present

## 2022-09-20 DIAGNOSIS — C50112 Malignant neoplasm of central portion of left female breast: Secondary | ICD-10-CM | POA: Diagnosis not present

## 2022-09-20 DIAGNOSIS — I110 Hypertensive heart disease with heart failure: Secondary | ICD-10-CM | POA: Diagnosis not present

## 2022-09-20 DIAGNOSIS — E1165 Type 2 diabetes mellitus with hyperglycemia: Secondary | ICD-10-CM | POA: Diagnosis not present

## 2022-09-20 DIAGNOSIS — I482 Chronic atrial fibrillation, unspecified: Secondary | ICD-10-CM | POA: Diagnosis not present

## 2022-09-20 DIAGNOSIS — J9601 Acute respiratory failure with hypoxia: Secondary | ICD-10-CM | POA: Diagnosis not present

## 2022-09-20 DIAGNOSIS — E876 Hypokalemia: Secondary | ICD-10-CM | POA: Diagnosis not present

## 2022-09-20 DIAGNOSIS — M6281 Muscle weakness (generalized): Secondary | ICD-10-CM | POA: Diagnosis not present

## 2022-09-20 DIAGNOSIS — G9341 Metabolic encephalopathy: Secondary | ICD-10-CM | POA: Diagnosis not present

## 2022-09-20 DIAGNOSIS — Z7984 Long term (current) use of oral hypoglycemic drugs: Secondary | ICD-10-CM | POA: Diagnosis not present

## 2022-09-20 DIAGNOSIS — D649 Anemia, unspecified: Secondary | ICD-10-CM | POA: Diagnosis not present

## 2022-09-20 DIAGNOSIS — Z7901 Long term (current) use of anticoagulants: Secondary | ICD-10-CM | POA: Diagnosis not present

## 2022-09-20 DIAGNOSIS — I509 Heart failure, unspecified: Secondary | ICD-10-CM | POA: Diagnosis not present

## 2022-09-21 DIAGNOSIS — Z7901 Long term (current) use of anticoagulants: Secondary | ICD-10-CM | POA: Diagnosis not present

## 2022-09-21 DIAGNOSIS — I509 Heart failure, unspecified: Secondary | ICD-10-CM | POA: Diagnosis not present

## 2022-09-21 DIAGNOSIS — C50112 Malignant neoplasm of central portion of left female breast: Secondary | ICD-10-CM | POA: Diagnosis not present

## 2022-09-21 DIAGNOSIS — G9341 Metabolic encephalopathy: Secondary | ICD-10-CM | POA: Diagnosis not present

## 2022-09-21 DIAGNOSIS — E1165 Type 2 diabetes mellitus with hyperglycemia: Secondary | ICD-10-CM | POA: Diagnosis not present

## 2022-09-21 DIAGNOSIS — M6281 Muscle weakness (generalized): Secondary | ICD-10-CM | POA: Diagnosis not present

## 2022-09-21 DIAGNOSIS — I110 Hypertensive heart disease with heart failure: Secondary | ICD-10-CM | POA: Diagnosis not present

## 2022-09-21 DIAGNOSIS — D649 Anemia, unspecified: Secondary | ICD-10-CM | POA: Diagnosis not present

## 2022-09-21 DIAGNOSIS — N390 Urinary tract infection, site not specified: Secondary | ICD-10-CM | POA: Diagnosis not present

## 2022-09-21 DIAGNOSIS — E1143 Type 2 diabetes mellitus with diabetic autonomic (poly)neuropathy: Secondary | ICD-10-CM | POA: Diagnosis not present

## 2022-09-21 DIAGNOSIS — J9601 Acute respiratory failure with hypoxia: Secondary | ICD-10-CM | POA: Diagnosis not present

## 2022-09-21 DIAGNOSIS — E876 Hypokalemia: Secondary | ICD-10-CM | POA: Diagnosis not present

## 2022-09-21 DIAGNOSIS — Z7984 Long term (current) use of oral hypoglycemic drugs: Secondary | ICD-10-CM | POA: Diagnosis not present

## 2022-09-21 DIAGNOSIS — I482 Chronic atrial fibrillation, unspecified: Secondary | ICD-10-CM | POA: Diagnosis not present

## 2022-09-25 ENCOUNTER — Other Ambulatory Visit: Payer: Self-pay | Admitting: Internal Medicine

## 2022-09-25 DIAGNOSIS — E1165 Type 2 diabetes mellitus with hyperglycemia: Secondary | ICD-10-CM

## 2022-09-27 DIAGNOSIS — G4733 Obstructive sleep apnea (adult) (pediatric): Secondary | ICD-10-CM | POA: Diagnosis not present

## 2022-09-28 DIAGNOSIS — G4733 Obstructive sleep apnea (adult) (pediatric): Secondary | ICD-10-CM | POA: Diagnosis not present

## 2022-10-09 ENCOUNTER — Other Ambulatory Visit: Payer: Self-pay

## 2022-10-09 ENCOUNTER — Telehealth: Payer: Self-pay | Admitting: Internal Medicine

## 2022-10-09 DIAGNOSIS — C50412 Malignant neoplasm of upper-outer quadrant of left female breast: Secondary | ICD-10-CM

## 2022-10-09 MED ORDER — ANASTROZOLE 1 MG PO TABS
1.0000 mg | ORAL_TABLET | Freq: Every day | ORAL | 0 refills | Status: DC
Start: 2022-10-09 — End: 2023-01-11

## 2022-10-09 NOTE — Telephone Encounter (Signed)
Prescription Request  10/09/2022  LOV: 09/06/2022  What is the name of the medication or equipment? anastrozole (ARIMIDEX) 1 MG tablet   Have you contacted your pharmacy to request a refill? Yes   Which pharmacy would you like this sent to?   [facility] [address] [place] - [address] [place] [address] Phone: [phone] Fax: [phone]    Patient notified that their request is being sent to the clinical staff for review and that they should receive a response within 2 business days.   Please advise at [facility] [phone]

## 2022-10-09 NOTE — Telephone Encounter (Signed)
Refills sent to pharmacy. 

## 2022-10-27 DIAGNOSIS — G4733 Obstructive sleep apnea (adult) (pediatric): Secondary | ICD-10-CM | POA: Diagnosis not present

## 2022-11-13 ENCOUNTER — Emergency Department (HOSPITAL_COMMUNITY): Payer: 59

## 2022-11-13 ENCOUNTER — Other Ambulatory Visit: Payer: Self-pay

## 2022-11-13 ENCOUNTER — Inpatient Hospital Stay (HOSPITAL_COMMUNITY)
Admission: EM | Admit: 2022-11-13 | Discharge: 2022-11-16 | DRG: 872 | Disposition: A | Discharge status: Home / Self Care | Payer: 59 | Attending: Family Medicine | Admitting: Family Medicine

## 2022-11-13 DIAGNOSIS — R6889 Other general symptoms and signs: Secondary | ICD-10-CM | POA: Diagnosis not present

## 2022-11-13 DIAGNOSIS — H1032 Unspecified acute conjunctivitis, left eye: Secondary | ICD-10-CM | POA: Diagnosis not present

## 2022-11-13 DIAGNOSIS — A4159 Other Gram-negative sepsis: Principal | ICD-10-CM | POA: Diagnosis present

## 2022-11-13 DIAGNOSIS — E7849 Other hyperlipidemia: Secondary | ICD-10-CM | POA: Diagnosis not present

## 2022-11-13 DIAGNOSIS — R0689 Other abnormalities of breathing: Secondary | ICD-10-CM | POA: Diagnosis not present

## 2022-11-13 DIAGNOSIS — Z8249 Family history of ischemic heart disease and other diseases of the circulatory system: Secondary | ICD-10-CM | POA: Diagnosis not present

## 2022-11-13 DIAGNOSIS — E876 Hypokalemia: Secondary | ICD-10-CM | POA: Diagnosis not present

## 2022-11-13 DIAGNOSIS — Z803 Family history of malignant neoplasm of breast: Secondary | ICD-10-CM

## 2022-11-13 DIAGNOSIS — Z853 Personal history of malignant neoplasm of breast: Secondary | ICD-10-CM | POA: Diagnosis not present

## 2022-11-13 DIAGNOSIS — E1140 Type 2 diabetes mellitus with diabetic neuropathy, unspecified: Secondary | ICD-10-CM

## 2022-11-13 DIAGNOSIS — I4891 Unspecified atrial fibrillation: Secondary | ICD-10-CM

## 2022-11-13 DIAGNOSIS — I429 Cardiomyopathy, unspecified: Secondary | ICD-10-CM

## 2022-11-13 DIAGNOSIS — I499 Cardiac arrhythmia, unspecified: Secondary | ICD-10-CM | POA: Diagnosis not present

## 2022-11-13 DIAGNOSIS — D509 Iron deficiency anemia, unspecified: Secondary | ICD-10-CM | POA: Diagnosis present

## 2022-11-13 DIAGNOSIS — I509 Heart failure, unspecified: Secondary | ICD-10-CM | POA: Diagnosis not present

## 2022-11-13 DIAGNOSIS — G4733 Obstructive sleep apnea (adult) (pediatric): Secondary | ICD-10-CM | POA: Diagnosis not present

## 2022-11-13 DIAGNOSIS — A419 Sepsis, unspecified organism: Secondary | ICD-10-CM | POA: Diagnosis not present

## 2022-11-13 DIAGNOSIS — Z820 Family history of epilepsy and other diseases of the nervous system: Secondary | ICD-10-CM

## 2022-11-13 DIAGNOSIS — I480 Paroxysmal atrial fibrillation: Secondary | ICD-10-CM | POA: Diagnosis not present

## 2022-11-13 DIAGNOSIS — B961 Klebsiella pneumoniae [K. pneumoniae] as the cause of diseases classified elsewhere: Secondary | ICD-10-CM | POA: Diagnosis not present

## 2022-11-13 DIAGNOSIS — Z743 Need for continuous supervision: Secondary | ICD-10-CM | POA: Diagnosis not present

## 2022-11-13 DIAGNOSIS — Z7984 Long term (current) use of oral hypoglycemic drugs: Secondary | ICD-10-CM | POA: Diagnosis not present

## 2022-11-13 DIAGNOSIS — I10 Essential (primary) hypertension: Secondary | ICD-10-CM | POA: Diagnosis not present

## 2022-11-13 DIAGNOSIS — Z79811 Long term (current) use of aromatase inhibitors: Secondary | ICD-10-CM

## 2022-11-13 DIAGNOSIS — E861 Hypovolemia: Secondary | ICD-10-CM | POA: Diagnosis present

## 2022-11-13 DIAGNOSIS — Z7901 Long term (current) use of anticoagulants: Secondary | ICD-10-CM | POA: Diagnosis not present

## 2022-11-13 DIAGNOSIS — N390 Urinary tract infection, site not specified: Secondary | ICD-10-CM | POA: Diagnosis not present

## 2022-11-13 DIAGNOSIS — Z79899 Other long term (current) drug therapy: Secondary | ICD-10-CM

## 2022-11-13 DIAGNOSIS — Z9012 Acquired absence of left breast and nipple: Secondary | ICD-10-CM

## 2022-11-13 DIAGNOSIS — R531 Weakness: Principal | ICD-10-CM

## 2022-11-13 DIAGNOSIS — E785 Hyperlipidemia, unspecified: Secondary | ICD-10-CM | POA: Diagnosis present

## 2022-11-13 DIAGNOSIS — E1122 Type 2 diabetes mellitus with diabetic chronic kidney disease: Secondary | ICD-10-CM | POA: Diagnosis present

## 2022-11-13 DIAGNOSIS — I5022 Chronic systolic (congestive) heart failure: Secondary | ICD-10-CM | POA: Diagnosis present

## 2022-11-13 DIAGNOSIS — Z8349 Family history of other endocrine, nutritional and metabolic diseases: Secondary | ICD-10-CM

## 2022-11-13 DIAGNOSIS — I5032 Chronic diastolic (congestive) heart failure: Secondary | ICD-10-CM

## 2022-11-13 DIAGNOSIS — Z823 Family history of stroke: Secondary | ICD-10-CM

## 2022-11-13 DIAGNOSIS — R6510 Systemic inflammatory response syndrome (SIRS) of non-infectious origin without acute organ dysfunction: Secondary | ICD-10-CM | POA: Diagnosis not present

## 2022-11-13 DIAGNOSIS — M7989 Other specified soft tissue disorders: Secondary | ICD-10-CM

## 2022-11-13 DIAGNOSIS — I110 Hypertensive heart disease with heart failure: Secondary | ICD-10-CM | POA: Diagnosis present

## 2022-11-13 DIAGNOSIS — Z555 Less than a high school diploma: Secondary | ICD-10-CM

## 2022-11-13 LAB — CBC WITH DIFFERENTIAL/PLATELET
Abs Immature Granulocytes: 0.03 10*3/uL (ref 0.00–0.07)
Basophils Absolute: 0 10*3/uL (ref 0.0–0.1)
Basophils Relative: 0 %
Eosinophils Absolute: 0 10*3/uL (ref 0.0–0.5)
Eosinophils Relative: 0 %
HCT: 32.8 % — ABNORMAL LOW (ref 36.0–46.0)
Hemoglobin: 11.2 g/dL — ABNORMAL LOW (ref 12.0–15.0)
Immature Granulocytes: 0 %
Lymphocytes Relative: 22 %
Lymphs Abs: 2.3 10*3/uL (ref 0.7–4.0)
MCH: 30.7 pg (ref 26.0–34.0)
MCHC: 34.1 g/dL (ref 30.0–36.0)
MCV: 89.9 fL (ref 80.0–100.0)
Monocytes Absolute: 0.4 10*3/uL (ref 0.1–1.0)
Monocytes Relative: 4 %
Neutro Abs: 7.9 10*3/uL — ABNORMAL HIGH (ref 1.7–7.7)
Neutrophils Relative %: 74 %
Platelets: 182 10*3/uL (ref 150–400)
RBC: 3.65 MIL/uL — ABNORMAL LOW (ref 3.87–5.11)
RDW: 13.1 % (ref 11.5–15.5)
WBC: 10.6 10*3/uL — ABNORMAL HIGH (ref 4.0–10.5)
nRBC: 0 % (ref 0.0–0.2)

## 2022-11-13 LAB — LACTIC ACID, PLASMA
Lactic Acid, Venous: 1.4 mmol/L (ref 0.5–1.9)
Lactic Acid, Venous: 1.4 mmol/L (ref 0.5–1.9)

## 2022-11-13 LAB — TROPONIN I (HIGH SENSITIVITY)
Troponin I (High Sensitivity): 15 ng/L (ref <18)
Troponin I (High Sensitivity): 14 ng/L (ref <18)

## 2022-11-13 LAB — CULTURE, BLOOD (ROUTINE X 2)
Special Requests: ADEQUATE
Special Requests: ADEQUATE

## 2022-11-13 LAB — URINALYSIS, W/ REFLEX TO CULTURE (INFECTION SUSPECTED)
Bilirubin Urine: NEGATIVE
Glucose, UA: NEGATIVE mg/dL
Ketones, ur: NEGATIVE mg/dL
Nitrite: POSITIVE — AB
Protein, ur: 30 mg/dL — AB
Specific Gravity, Urine: 1.02 (ref 1.005–1.030)
pH: 5 (ref 5.0–8.0)

## 2022-11-13 LAB — BASIC METABOLIC PANEL
Anion gap: 13 (ref 5–15)
BUN: 40 mg/dL — ABNORMAL HIGH (ref 8–23)
CO2: 21 mmol/L — ABNORMAL LOW (ref 22–32)
Calcium: 8.9 mg/dL (ref 8.9–10.3)
Chloride: 98 mmol/L (ref 98–111)
Creatinine, Ser: 1.48 mg/dL — ABNORMAL HIGH (ref 0.44–1.00)
GFR, Estimated: 38 mL/min — ABNORMAL LOW (ref >60)
Glucose, Bld: 192 mg/dL — ABNORMAL HIGH (ref 70–99)
Potassium: 3.2 mmol/L — ABNORMAL LOW (ref 3.5–5.1)
Sodium: 132 mmol/L — ABNORMAL LOW (ref 135–145)

## 2022-11-13 LAB — BRAIN NATRIURETIC PEPTIDE: B Natriuretic Peptide: 82 pg/mL (ref 0.0–100.0)

## 2022-11-13 LAB — HEPATIC FUNCTION PANEL
ALT: 25 U/L (ref 0–44)
AST: 69 U/L — ABNORMAL HIGH (ref 15–41)
Albumin: 3.3 g/dL — ABNORMAL LOW (ref 3.5–5.0)
Alkaline Phosphatase: 55 U/L (ref 38–126)
Bilirubin, Direct: 0.1 mg/dL (ref 0.0–0.2)
Indirect Bilirubin: 0.6 mg/dL (ref 0.3–0.9)
Total Bilirubin: 0.7 mg/dL (ref 0.3–1.2)
Total Protein: 7.7 g/dL (ref 6.5–8.1)

## 2022-11-13 LAB — GLUCOSE, CAPILLARY: Glucose-Capillary: 113 mg/dL — ABNORMAL HIGH (ref 70–99)

## 2022-11-13 LAB — MAGNESIUM: Magnesium: 1.8 mg/dL (ref 1.7–2.4)

## 2022-11-13 MED ORDER — APIXABAN 5 MG PO TABS
5.0000 mg | ORAL_TABLET | Freq: Once | ORAL | Status: AC
  Administered 2022-11-13: 5 mg via ORAL
  Filled 2022-11-13: qty 1

## 2022-11-13 MED ORDER — DILTIAZEM HCL-DEXTROSE 125-5 MG/125ML-% IV SOLN (PREMIX)
5.0000 mg/h | INTRAVENOUS | Status: DC
  Administered 2022-11-13 – 2022-11-14 (×2): 5 mg/h via INTRAVENOUS
  Filled 2022-11-13 (×2): qty 125

## 2022-11-13 MED ORDER — ERYTHROMYCIN 5 MG/GM OP OINT
TOPICAL_OINTMENT | Freq: Once | OPHTHALMIC | Status: AC
  Filled 2022-11-13: qty 3.5

## 2022-11-13 MED ORDER — LACTATED RINGERS IV BOLUS
500.0000 mL | Freq: Once | INTRAVENOUS | Status: AC
  Administered 2022-11-13: 500 mL via INTRAVENOUS

## 2022-11-13 MED ORDER — POTASSIUM CHLORIDE CRYS ER 20 MEQ PO TBCR
40.0000 meq | EXTENDED_RELEASE_TABLET | Freq: Once | ORAL | Status: AC
  Administered 2022-11-13: 40 meq via ORAL
  Filled 2022-11-13: qty 2

## 2022-11-13 MED ORDER — MAGNESIUM SULFATE 2 GM/50ML IV SOLN
2.0000 g | Freq: Once | INTRAVENOUS | Status: AC
  Administered 2022-11-13: 2 g via INTRAVENOUS
  Filled 2022-11-13: qty 50

## 2022-11-13 MED ORDER — CHLORHEXIDINE GLUCONATE CLOTH 2 % EX PADS
6.0000 | MEDICATED_PAD | Freq: Every day | CUTANEOUS | Status: DC
  Administered 2022-11-13 – 2022-11-15 (×3): 6 via TOPICAL

## 2022-11-13 NOTE — Progress Notes (Signed)
Went in and set up patient's home CPAP unit.  Placed at bedside for patient when she is ready to go on.

## 2022-11-13 NOTE — Assessment & Plan Note (Signed)
Continue crestor 5mg 

## 2022-11-13 NOTE — Assessment & Plan Note (Signed)
Replete K. Keep K > 4.0

## 2022-11-13 NOTE — ED Notes (Signed)
Both sets of blood cultures obtained before any antibiotic administration  

## 2022-11-13 NOTE — Assessment & Plan Note (Signed)
-   CPAP at night °

## 2022-11-13 NOTE — Assessment & Plan Note (Signed)
Hold metformin, rybelsus. Add SSI. Check A1c.

## 2022-11-13 NOTE — Assessment & Plan Note (Signed)
LVEF 45-50% on echo 08-2022. Updated with limited echo. Has not yet had ischemic evaluation for afib evaluation.

## 2022-11-13 NOTE — H&P (Signed)
History and Physical    Lisa Thornton ZOX:[id number] DOB: 07/31/1955 DOA: 11/13/2022  DOS: the patient was seen and examined on 11/13/2022  PCP: Anabel Halon, MD   Patient coming from: Home  I have personally briefly reviewed patient's old medical records in [facility]  CC: fatigue HPI: 67 year old female history of breast cancer status post left mastectomy, history of A-fib, cardiomyopathy EF of 45-50%, type 2 diabetes with neuropathy, hypertension, OSA, hyperlipidemia presents to the ER today with a 2-day history of feeling fatigued.  No palpitations.  Patient has missed 2 days worth of Eliquis.  She decided not to take it.  No reason given.  Denies any chest pain.  Shortness of breath.  Came to the ER for evaluation.  On arrival temp 99.5 heart rate 121 blood pressure 102/63 satting 97% on room air.  EKG my interpretation shows rapid atrial fibrillation.  White count 10.6, hemoglobin 0.2, platelets of 182  Lactic acid 1.4  Sodium 132, potassium 3.2, chloride 98, bicarb 21, BUN of 40, creatinine 1.48  BNP of 82  Chest x-ray shows no acute cardiopulmonary disease.  Patient started on IV Cardizem.  [facility] hospitalist consulted.  In looking through the chart, appears that the patient developed atrial fibrillation while she was admitted to hospital in April 2024.  She was loaded with IV amiodarone.  She was discharged home.  She followed up with cardiology at the end of April 2024.  At that time she was still in atrial fibrillation and the decision was made by cardiology to stop her amiodarone.  Repeat echocardiogram was done at the end of May which showed similar left ventricular ejection fraction of 45 to 50%.  She is also underwent sleep study which shows mild sleep apnea.       ED Course: EKG shows rapid afib. K 3.2  Review of Systems:  Review of Systems  Constitutional:  Positive for malaise/fatigue. Negative for chills and fever.  HENT: Negative.    Eyes:   Positive for discharge.  Respiratory: Negative.  Negative for shortness of breath.   Cardiovascular:  Negative for chest pain.  Gastrointestinal: Negative.   Musculoskeletal: Negative.   Skin: Negative.   Neurological: Negative.   Endo/Heme/Allergies: Negative.   Psychiatric/Behavioral: Negative.    All other systems reviewed and are negative.   Past Medical History:  Diagnosis Date   Anemia 12/31/2017   Cancer (HCC)    left breast cancer   Chest pain    Diabetes mellitus without complication (HCC)    Hypertension     Past Surgical History:  Procedure Laterality Date   COLONOSCOPY WITH PROPOFOL N/A 09/27/2021   Procedure: COLONOSCOPY WITH PROPOFOL;  Surgeon: Dolores Frame, MD;  Location: [facility];  Service: Gastroenterology;  Laterality: N/A;  945   MASTECTOMY MODIFIED RADICAL Left 12/31/2017   Procedure: LEFT MODIFIED RADICAL MASTECTOMY;  Surgeon: Franky Macho, MD;  Location: [facility];  Service: General;  Laterality: Left;   POLYPECTOMY  09/27/2021   Procedure: POLYPECTOMY;  Surgeon: Dolores Frame, MD;  Location: [facility];  Service: Gastroenterology;;   PORTACATH PLACEMENT Right 06/27/2017   Procedure: INSERTION PORT-A-CATH;  Surgeon: Franky Macho, MD;  Location: [facility];  Service: General;  Laterality: Right;     reports that she has never smoked. She has never used smokeless tobacco. She reports that she does not drink alcohol and does not use drugs.  No Known Allergies  Family History  Problem Relation Age of Onset  Breast cancer Mother    Thyroid disease Mother    Heart disease Mother    Heart attack Father    Heart attack Sister    Hypertension Sister    Heart attack Brother    Cancer Sister    Stroke Brother    Alzheimer's disease Maternal Aunt     Prior to Admission medications   Medication Sig Start Date End Date Taking? Authorizing Provider  amLODipine (NORVASC) 5 MG tablet Take 5 mg by mouth daily. 08/15/22  Yes [provider]  anastrozole (ARIMIDEX) 1 MG tablet Take 1 tablet (1 mg total) by mouth daily. 10/09/22  Yes Anabel Halon, MD  apixaban (ELIQUIS) 5 MG TABS tablet Take 1 tablet (5 mg total) by mouth 2 (two) times daily. 04/26/22  Yes Anabel Halon, MD  atorvastatin (LIPITOR) 10 MG tablet Take 1 tablet (10 mg total) by mouth at bedtime. 08/24/22  Yes Anabel Halon, MD  calcium carbonate (OS-CAL) 600 MG TABS tablet Take 600 mg by mouth daily with breakfast.   Yes [provider]  cholecalciferol (VITAMIN D3) 25 MCG (1000 UT) tablet Take 1,000 Units by mouth daily.   Yes [provider]  furosemide (LASIX) 40 MG tablet Take 0.5 tablets (20 mg total) by mouth daily. 09/07/22  Yes Anabel Halon, MD  magnesium oxide (MAG-OX) 400 (240 Mg) MG tablet Take 1 tablet (400 mg total) by mouth daily. 07/10/22  Yes Tat, Onalee Hua, MD  metFORMIN (GLUCOPHAGE) 1000 MG tablet Take 1 tablet (1,000 mg total) by mouth daily with breakfast. 09/06/22  Yes Anabel Halon, MD  metoprolol tartrate (LOPRESSOR) 50 MG tablet Take 1 tablet (50 mg total) by mouth 2 (two) times daily. 09/06/22  Yes Patel, Earlie Lou, MD  Semaglutide (RYBELSUS) 7 MG TABS Take 1 tablet (7 mg total) by mouth daily. 04/26/22  Yes Anabel Halon, MD  Accu-Chek Softclix Lancets lancets USE 1  TO CHECK GLUCOSE UP TO 4 TIMES DAILY 08/16/20   Heather Roberts, NP  blood glucose meter kit and supplies Dispense based on patient and insurance preference. Use up to four times daily as directed. (FOR ICD-10 E10.9, E11.9). 10/29/19   Freddy Finner, NP  glucose blood (ACCU-CHEK GUIDE) test strip USE 1 STRIP TO CHECK GLUCOSE THREE TIMES DAILY IN THE MORNING, AT NOON, AND AT BEDTIME AS DIRECTED 09/26/22   Anabel Halon, MD  Omega-3 Fatty Acids (FISH OIL) 1000 MG CAPS Take 1 capsule by mouth 2 (two) times daily. Patient not taking: Reported on 11/13/2022    [provider]  rosuvastatin (CRESTOR) 5 MG tablet Take 1 tablet (5 mg total) by mouth  daily. Patient not taking: Reported on 11/13/2022 12/08/21   Anabel Halon, MD  UNABLE TO FIND 1 each by Does not apply route daily. Med Name: Shower Chair 08/16/22   Anabel Halon, MD  UNABLE TO FIND 1 each by Does not apply route daily. Med Name: Bed side Commode to fix over toilet seat 08/24/22   Anabel Halon, MD    Physical Exam: Vitals:   11/13/22 1615 11/13/22 1620 11/13/22 1629 11/13/22 1630  BP: (!) 83/69 111/61  105/62  Pulse: 98 100 (!) 106 (!) 104  Resp: 16 20 18 17   Temp:      TempSrc:      SpO2: 96% 95% 96% 96%    Physical Exam Vitals and nursing note reviewed.  Constitutional:      Appearance: She is  not toxic-appearing or diaphoretic.     Comments: Appears chronically ill  HENT:     Head: Normocephalic and atraumatic.     Nose: Nose normal.  Eyes:     General:        Left eye: Discharge present. Cardiovascular:     Rate and Rhythm: Tachycardia present. Rhythm irregular.     Pulses: Normal pulses.  Pulmonary:     Effort: Pulmonary effort is normal. No respiratory distress.     Breath sounds: Normal breath sounds.  Abdominal:     General: Abdomen is flat. Bowel sounds are normal. There is no distension.     Palpations: Abdomen is soft.     Tenderness: There is no abdominal tenderness.  Musculoskeletal:     Right lower leg: No edema.     Left lower leg: No edema.  Skin:    General: Skin is warm and dry.     Capillary Refill: Capillary refill takes less than 2 seconds.  Neurological:     General: No focal deficit present.     Mental Status: She is alert and oriented to person, place, and time.      Labs on Admission: I have personally reviewed following labs and imaging studies  CBC: Recent Labs  Lab 11/13/22 1440  WBC 10.6*  NEUTROABS 7.9*  HGB 11.2*  HCT 32.8*  MCV 89.9  PLT 182   Basic Metabolic Panel: Recent Labs  Lab 11/13/22 1440  NA 132*  K 3.2*  CL 98  CO2 21*  GLUCOSE 192*  BUN 40*  CREATININE 1.48*  CALCIUM 8.9  MG 1.8    GFR: CrCl cannot be calculated (Unknown ideal weight.). Liver Function Tests: Recent Labs  Lab 11/13/22 1440  AST 69*  ALT 25  ALKPHOS 55  BILITOT 0.7  PROT 7.7  ALBUMIN 3.3*   Cardiac Enzymes: Recent Labs  Lab 11/13/22 1440  TROPONINIHS 15   BNP (last 3 results) Recent Labs    11/13/22 1440  BNP 82.0   Radiological Exams on Admission: I have personally reviewed images DG Chest Port 1 View  Result Date: 11/13/2022 CLINICAL DATA:  Sepsis. EXAM: PORTABLE CHEST 1 VIEW COMPARISON:  X-ray 08/10/2022 and older FINDINGS: Right subclavian chest port with tip along the SVC brachiocephalic junction region, unchanged from prior. Underinflation. No consolidation, pneumothorax or effusion. No edema. Normal cardiopericardial silhouette. Overlapping cardiac leads surgical clips in the left axillary region. Old rib fractures. IMPRESSION: Underinflation.  No acute cardiopulmonary disease.  Chest port Electronically Signed   By: Karen Kays M.D.   On: 11/13/2022 15:31    EKG: My personal interpretation of EKG shows: rapid afib    Assessment/Plan Principal Problem:   Atrial fibrillation with rapid ventricular response (HCC) Active Problems:   Hypokalemia   Essential hypertension   Type 2 diabetes mellitus with diabetic neuropathy, unspecified (HCC)   HLD (hyperlipidemia)   Cardiomyopathy (HCC)   OSA (obstructive sleep apnea)    Assessment and Plan: * Atrial fibrillation with rapid ventricular response (HCC) Admit to telemetry bed. Continue with IV cardizem gtts. Restart po eliquis(only missed 2 days). Pt was taken off amiodarone by cardiology during April 2024 office visit. Office EKG showed pt was still in afib.  Pt has not yet had ischemic evaluation yet. Limited echo to evaluate LVEF. Pt just had complete echo in 08-2022. Restart lopressor but at reduced dose due to borderline BP. Replete K and Mg. Keep K >4.0 and Mg >2.0  Hypokalemia Replete K. Keep K >  4.0  OSA (obstructive  sleep apnea) CPAP at night.  Cardiomyopathy (HCC) LVEF 45-50% on echo 08-2022. Updated with limited echo. Has not yet had ischemic evaluation for afib evaluation.  HLD (hyperlipidemia) Continue crestor 5 mg  Type 2 diabetes mellitus with diabetic neuropathy, unspecified (HCC) Hold metformin, rybelsus. Add SSI. Check A1c.  Essential hypertension Stable. Hold norvasc.   DVT prophylaxis: Eliquis Code Status: Full Code Family Communication: discussed with pt and niece [name] Disposition Plan: return home  Consults called: none  Admission status: Observation, Step Down Unit   Carollee Herter, DO [facility] 11/13/2022, 5:43 PM

## 2022-11-13 NOTE — ED Provider Notes (Signed)
[facility] EMERGENCY DEPARTMENT AT [facility] Provider Note   CSN: [id number] Arrival date & time: 11/13/22  1409     History  Chief Complaint  Patient presents with   Near Syncope    Lisa Thornton is a 67 y.o. female.   Near Syncope  Patient presents for generalized weakness.  Medical history includes atrial fibrillation, T2DM, HLD, CHF, HTN, OSA, anemia, breast cancer.  Onset of new weakness was yesterday.  She has had near syncopal symptoms with standing.  Symptoms were worse today prompting EMS to be called.  When EMS arrived on scene, patient had rapid heart rate in the range of 170 and hypotension.  Twelve-lead showed irregularly irregular narrow complex tachycardia.  She was given 16 mg of diltiazem and 500 cc of IVF.  Heart rate improved and with rate control, patient had improved blood pressures as well.  Patient has had left eye pain and discharge.  She denies any other current areas of discomfort.     Home Medications Prior to Admission medications   Medication Sig Start Date End Date Taking? Authorizing Provider  Accu-Chek Softclix Lancets lancets USE 1  TO CHECK GLUCOSE UP TO 4 TIMES DAILY 08/16/20   Heather Roberts, NP  Acetaminophen-DM (CORICIDIN HBP COLD/COUGH/FLU PO) Take 1 tablet by mouth daily as needed (cough).    [provider]  anastrozole (ARIMIDEX) 1 MG tablet Take 1 tablet (1 mg total) by mouth daily. 10/09/22   Anabel Halon, MD  apixaban (ELIQUIS) 5 MG TABS tablet Take 1 tablet (5 mg total) by mouth 2 (two) times daily. 04/26/22   Anabel Halon, MD  atorvastatin (LIPITOR) 10 MG tablet Take 1 tablet (10 mg total) by mouth at bedtime. 08/24/22   Anabel Halon, MD  blood glucose meter kit and supplies Dispense based on patient and insurance preference. Use up to four times daily as directed. (FOR ICD-10 E10.9, E11.9). 10/29/19   Freddy Finner, NP  calcium carbonate (OS-CAL) 600 MG TABS tablet Take 600 mg by mouth daily with breakfast.     [provider]  cholecalciferol (VITAMIN D3) 25 MCG (1000 UT) tablet Take 1,000 Units by mouth 2 (two) times daily.     [provider]  furosemide (LASIX) 40 MG tablet Take 0.5 tablets (20 mg total) by mouth daily. 09/07/22   Anabel Halon, MD  glucose blood (ACCU-CHEK GUIDE) test strip USE 1 STRIP TO CHECK GLUCOSE THREE TIMES DAILY IN THE MORNING, AT NOON, AND AT BEDTIME AS DIRECTED 09/26/22   Anabel Halon, MD  magnesium oxide (MAG-OX) 400 (240 Mg) MG tablet Take 1 tablet (400 mg total) by mouth daily. 07/10/22   Catarina Hartshorn, MD  metFORMIN (GLUCOPHAGE) 1000 MG tablet Take 1 tablet (1,000 mg total) by mouth daily with breakfast. 09/06/22   Anabel Halon, MD  metoprolol tartrate (LOPRESSOR) 50 MG tablet Take 1 tablet (50 mg total) by mouth 2 (two) times daily. 09/06/22   Anabel Halon, MD  Omega-3 Fatty Acids (FISH OIL) 1000 MG CAPS Take 1 capsule by mouth 2 (two) times daily.    [provider]  rosuvastatin (CRESTOR) 5 MG tablet Take 1 tablet (5 mg total) by mouth daily. 12/08/21   Anabel Halon, MD  Semaglutide (RYBELSUS) 7 MG TABS Take 1 tablet (7 mg total) by mouth daily. 04/26/22   Anabel Halon, MD  UNABLE TO FIND 1 each by Does not apply route daily. Med Name: Shower Chair  08/16/22   Anabel Halon, MD  UNABLE TO FIND 1 each by Does not apply route daily. Med Name: Bed side Commode to fix over toilet seat 08/24/22   Anabel Halon, MD      Allergies    Patient has no known allergies.    Review of Systems   Review of Systems  Constitutional:  Positive for fatigue.  Eyes:  Positive for pain and discharge.  Cardiovascular:  Positive for near-syncope.  Neurological:  Positive for dizziness, weakness (Generalized) and light-headedness.  All other systems reviewed and are negative.   Physical Exam Updated Vital Signs BP 111/61   Pulse 100   Temp 99.5 F (37.5 C) (Oral)   Resp 20   SpO2 95%  Physical Exam Vitals and nursing note reviewed.   Constitutional:      General: She is not in acute distress.    Appearance: Normal appearance. She is well-developed. She is not ill-appearing, toxic-appearing or diaphoretic.  HENT:     Head: Normocephalic and atraumatic.     Right Ear: External ear normal.     Left Ear: External ear normal.     Nose: Nose normal.     Mouth/Throat:     Mouth: Mucous membranes are moist.  Eyes:     Extraocular Movements: Extraocular movements intact.     Comments: Clear-white discharge from left eye, mild erythematous change to lower eyelid without swelling.  Cardiovascular:     Rate and Rhythm: Tachycardia present. Rhythm irregular.     Heart sounds: No murmur heard. Pulmonary:     Effort: Pulmonary effort is normal. No respiratory distress.     Breath sounds: Normal breath sounds. No wheezing or rales.  Chest:     Chest wall: No tenderness.  Abdominal:     Palpations: Abdomen is soft.     Tenderness: There is no abdominal tenderness.  Musculoskeletal:        General: No swelling. Normal range of motion.     Cervical back: Normal range of motion and neck supple.     Right lower leg: Edema present.     Left lower leg: Edema present.  Skin:    General: Skin is warm and dry.     Capillary Refill: Capillary refill takes less than 2 seconds.     Coloration: Skin is pale. Skin is not jaundiced.  Neurological:     General: No focal deficit present.     Mental Status: She is alert and oriented to person, place, and time.     Cranial Nerves: No cranial nerve deficit.     Sensory: No sensory deficit.     Motor: No weakness.     Coordination: Coordination normal.  Psychiatric:        Mood and Affect: Mood normal.        Behavior: Behavior normal.        Thought Content: Thought content normal.        Judgment: Judgment normal.     ED Results / Procedures / Treatments   Labs (all labs ordered are listed, but only abnormal results are displayed) Labs Reviewed  CBC WITH DIFFERENTIAL/PLATELET -  Abnormal; Notable for the following components:      Result Value   WBC 10.6 (*)    RBC 3.65 (*)    Hemoglobin 11.2 (*)    HCT 32.8 (*)    Neutro Abs 7.9 (*)    All other components within normal limits  CULTURE, BLOOD (ROUTINE X  2)  CULTURE, BLOOD (ROUTINE X 2)  LACTIC ACID, PLASMA  LACTIC ACID, PLASMA  BRAIN NATRIURETIC PEPTIDE  URINALYSIS, W/ REFLEX TO CULTURE (INFECTION SUSPECTED)  MAGNESIUM  BASIC METABOLIC PANEL  HEPATIC FUNCTION PANEL  I-STAT CHEM 8, ED  TROPONIN I (HIGH SENSITIVITY)  TROPONIN I (HIGH SENSITIVITY)    EKG EKG Interpretation Date/Time:  Monday November 13 2022 14:42:06 EDT Ventricular Rate:  134 PR Interval:    QRS Duration:  81 QT Interval:  304 QTC Calculation: 441 R Axis:   38  Text Interpretation: Atrial fibrillation Nonspecific repol abnormality, diffuse leads Confirmed by Gloris Manchester [phone]) on 11/13/2022 2:46:27 PM  Radiology DG Chest Port 1 View  Result Date: 11/13/2022 CLINICAL DATA:  Sepsis. EXAM: PORTABLE CHEST 1 VIEW COMPARISON:  X-ray 08/10/2022 and older FINDINGS: Right subclavian chest port with tip along the SVC brachiocephalic junction region, unchanged from prior. Underinflation. No consolidation, pneumothorax or effusion. No edema. Normal cardiopericardial silhouette. Overlapping cardiac leads surgical clips in the left axillary region. Old rib fractures. IMPRESSION: Underinflation.  No acute cardiopulmonary disease.  Chest port Electronically Signed   By: Karen Kays M.D.   On: 11/13/2022 15:31    Procedures Procedures    Medications Ordered in ED Medications  diltiazem (CARDIZEM) 125 mg in dextrose 5% 125 mL (1 mg/mL) infusion (5 mg/hr Intravenous Infusion Verify 11/13/22 1623)  lactated ringers bolus 500 mL (has no administration in time range)  erythromycin ophthalmic ointment ( Left Eye Given 11/13/22 1539)    ED Course/ Medical Decision Making/ A&P                                 Medical Decision Making Amount and/or  Complexity of Data Reviewed Labs: ordered. Radiology: ordered. ECG/medicine tests: ordered.  Risk Prescription drug management.   This patient presents to the ED for concern of generalized weakness, this involves an extensive number of treatment options, and is a complaint that carries with it a high risk of complications and morbidity.  The differential diagnosis includes arrhythmia, polypharmacy, infection, metabolic derangements, CHF   Co morbidities that complicate the patient evaluation  atrial fibrillation, T2DM, HLD, CHF, HTN, OSA, anemia, breast cancer   Additional history obtained:  Additional history obtained from EMS External records from outside source obtained and reviewed including EMR   Lab Tests:  I Ordered, and personally interpreted labs.  The pertinent results include: Mild leukocytosis, anemia is baseline, remaining lab work pending at time of signout.   Imaging Studies ordered:  I ordered imaging studies including chest x-ray I independently visualized and interpreted imaging which showed no acute findings I agree with the radiologist interpretation   Cardiac Monitoring: / EKG:  The patient was maintained on a cardiac monitor.  I personally viewed and interpreted the cardiac monitored which showed an underlying rhythm of: Atrial fibrillation   Problem List / ED Course / Critical interventions / Medication management  Patient presenting for generalized weakness over the past 24 hours.  On scene, EMS noted atrial fibrillation with RVR and hypotension.  She was treated with 16 mg of diltiazem and 500 cc IVF prior to arrival with subsequent improved vital signs.  On arrival, patient is awake, alert, and nondistressed.  She endorses discomfort to her left eye.  SI does show some mucopurulent discharge.  Erythromycin ointment ordered.  Patient denies any other areas of discomfort.  She does have lower extremity edema.  She is not  aware of a previous atrial  fibrillation diagnosis.  Per chart review, she does have a history of this.  Per chart review, medications include Eliquis, metoprolol, and Lasix.  Given patient's improved symptoms and vital signs following bolus of diltiazem, will initiate gtt.  Workup was initiated.  Patient remained on 5 mg/h of diltiazem.  She had continued improvement in her heart rate.  She remained normotensive.  Initial lab work is notable for mild leukocytosis.  Remaining lab work is pending at time of signout.  Care of patient was signed out to oncoming ED provider. I ordered medication including diltiazem for rate control; IV fluids for hydration; erythromycin ointment for conjunctivitis Reevaluation of the patient after these medicines showed that the patient improved I have reviewed the patients home medicines and have made adjustments as needed   Social Determinants of Health:  Lives at home with family  CRITICAL CARE Performed by: Gloris Manchester   Total critical care time: 32 minutes  Critical care time was exclusive of separately billable procedures and treating other patients.  Critical care was necessary to treat or prevent imminent or life-threatening deterioration.  Critical care was time spent personally by me on the following activities: development of treatment plan with patient and/or surrogate as well as nursing, discussions with consultants, evaluation of patient's response to treatment, examination of patient, obtaining history from patient or surrogate, ordering and performing treatments and interventions, ordering and review of laboratory studies, ordering and review of radiographic studies, pulse oximetry and re-evaluation of patient's condition.         Final Clinical Impression(s) / ED Diagnoses Final diagnoses:  Generalized weakness  Atrial fibrillation with RVR (HCC)  Acute conjunctivitis of left eye, unspecified acute conjunctivitis type    Rx / DC Orders ED Discharge Orders      None         Gloris Manchester, MD 11/13/22 1626

## 2022-11-13 NOTE — ED Notes (Signed)
L arm restriction

## 2022-11-13 NOTE — Subjective & Objective (Signed)
CC: fatigue HPI: 67 year old female history of breast cancer status post left mastectomy, history of A-fib, cardiomyopathy EF of 45-50%, type 2 diabetes with neuropathy, hypertension, OSA, hyperlipidemia presents to the ER today with a 2-day history of feeling fatigued.  No palpitations.  Patient has missed 2 days worth of Eliquis.  She decided not to take it.  No reason given.  Denies any chest pain.  Shortness of breath.  Came to the ER for evaluation.  On arrival temp 99.5 heart rate 121 blood pressure 102/63 satting 97% on room air.  EKG my interpretation shows rapid atrial fibrillation.  White count 10.6, hemoglobin 0.2, platelets of 182  Lactic acid 1.4  Sodium 132, potassium 3.2, chloride 98, bicarb 21, BUN of 40, creatinine 1.48  BNP of 82  Chest x-ray shows no acute cardiopulmonary disease.  Patient started on IV Cardizem.  [facility] hospitalist consulted.  In looking through the chart, appears that the patient developed atrial fibrillation while she was admitted to hospital in April 2024.  She was loaded with IV amiodarone.  She was discharged home.  She followed up with cardiology at the end of April 2024.  At that time she was still in atrial fibrillation and the decision was made by cardiology to stop her amiodarone.  Repeat echocardiogram was done at the end of May which showed similar left ventricular ejection fraction of 45 to 50%.  She is also underwent sleep study which shows mild sleep apnea.

## 2022-11-13 NOTE — ED Notes (Signed)
MD at bedside. 

## 2022-11-13 NOTE — Assessment & Plan Note (Signed)
Admit to telemetry bed. Continue with IV cardizem gtts. Restart po eliquis(only missed 2 days). Pt was taken off amiodarone by cardiology during April 2024 office visit. Office EKG showed pt was still in afib.  Pt has not yet had ischemic evaluation yet. Limited echo to evaluate LVEF. Pt just had complete echo in 08-2022. Restart lopressor but at reduced dose due to borderline BP. Replete K and Mg. Keep K >4.0 and Mg >2.0

## 2022-11-13 NOTE — ED Triage Notes (Signed)
Pt arrived via [facility] c/o a fib RVR with, per EMS, an initial HR of 177. EMS gave 16mg  of cardizem bolus and HR came down to 114. BP also initially low at 80/60 but came up prior to EMS arrival 140/50. C/o generalized weakness since this morning, near syncope, dizziness and cough. L sided pink eye that she would like checked out as well.

## 2022-11-13 NOTE — Assessment & Plan Note (Signed)
Stable. Hold norvasc.

## 2022-11-13 NOTE — ED Provider Notes (Signed)
Signed out that when labs back will need admission for weakness, afib/rvr, etc.  Pt/family indicates patient not compliant w eliquis therapy in past few days, therefore not candidate for ED cardioversion.   No current chest pain or sob. Hr 110.   Will consult hospitalists for admission.      Cathren Laine, MD 11/13/22 [phone]

## 2022-11-14 DIAGNOSIS — I110 Hypertensive heart disease with heart failure: Secondary | ICD-10-CM | POA: Diagnosis present

## 2022-11-14 DIAGNOSIS — N390 Urinary tract infection, site not specified: Secondary | ICD-10-CM | POA: Diagnosis present

## 2022-11-14 DIAGNOSIS — E7849 Other hyperlipidemia: Secondary | ICD-10-CM | POA: Diagnosis not present

## 2022-11-14 DIAGNOSIS — Z79899 Other long term (current) drug therapy: Secondary | ICD-10-CM | POA: Diagnosis not present

## 2022-11-14 DIAGNOSIS — Z853 Personal history of malignant neoplasm of breast: Secondary | ICD-10-CM | POA: Diagnosis not present

## 2022-11-14 DIAGNOSIS — E876 Hypokalemia: Secondary | ICD-10-CM | POA: Diagnosis present

## 2022-11-14 DIAGNOSIS — A4159 Other Gram-negative sepsis: Secondary | ICD-10-CM | POA: Diagnosis present

## 2022-11-14 DIAGNOSIS — E861 Hypovolemia: Secondary | ICD-10-CM | POA: Diagnosis present

## 2022-11-14 DIAGNOSIS — D509 Iron deficiency anemia, unspecified: Secondary | ICD-10-CM | POA: Diagnosis present

## 2022-11-14 DIAGNOSIS — Z9012 Acquired absence of left breast and nipple: Secondary | ICD-10-CM | POA: Diagnosis not present

## 2022-11-14 DIAGNOSIS — Z79811 Long term (current) use of aromatase inhibitors: Secondary | ICD-10-CM | POA: Diagnosis not present

## 2022-11-14 DIAGNOSIS — I4891 Unspecified atrial fibrillation: Secondary | ICD-10-CM | POA: Diagnosis not present

## 2022-11-14 DIAGNOSIS — Z7984 Long term (current) use of oral hypoglycemic drugs: Secondary | ICD-10-CM | POA: Diagnosis not present

## 2022-11-14 DIAGNOSIS — G4733 Obstructive sleep apnea (adult) (pediatric): Secondary | ICD-10-CM | POA: Diagnosis present

## 2022-11-14 DIAGNOSIS — Z7901 Long term (current) use of anticoagulants: Secondary | ICD-10-CM | POA: Diagnosis not present

## 2022-11-14 DIAGNOSIS — E1122 Type 2 diabetes mellitus with diabetic chronic kidney disease: Secondary | ICD-10-CM | POA: Diagnosis present

## 2022-11-14 DIAGNOSIS — I480 Paroxysmal atrial fibrillation: Secondary | ICD-10-CM | POA: Diagnosis present

## 2022-11-14 DIAGNOSIS — E785 Hyperlipidemia, unspecified: Secondary | ICD-10-CM | POA: Diagnosis present

## 2022-11-14 DIAGNOSIS — B961 Klebsiella pneumoniae [K. pneumoniae] as the cause of diseases classified elsewhere: Secondary | ICD-10-CM | POA: Diagnosis present

## 2022-11-14 DIAGNOSIS — Z8249 Family history of ischemic heart disease and other diseases of the circulatory system: Secondary | ICD-10-CM | POA: Diagnosis not present

## 2022-11-14 DIAGNOSIS — I5022 Chronic systolic (congestive) heart failure: Secondary | ICD-10-CM | POA: Diagnosis present

## 2022-11-14 DIAGNOSIS — E1140 Type 2 diabetes mellitus with diabetic neuropathy, unspecified: Secondary | ICD-10-CM | POA: Diagnosis present

## 2022-11-14 DIAGNOSIS — Z803 Family history of malignant neoplasm of breast: Secondary | ICD-10-CM | POA: Diagnosis not present

## 2022-11-14 DIAGNOSIS — H1032 Unspecified acute conjunctivitis, left eye: Secondary | ICD-10-CM | POA: Diagnosis present

## 2022-11-14 DIAGNOSIS — R531 Weakness: Secondary | ICD-10-CM | POA: Diagnosis not present

## 2022-11-14 DIAGNOSIS — I429 Cardiomyopathy, unspecified: Secondary | ICD-10-CM | POA: Diagnosis present

## 2022-11-14 DIAGNOSIS — I509 Heart failure, unspecified: Secondary | ICD-10-CM | POA: Diagnosis not present

## 2022-11-14 DIAGNOSIS — R6510 Systemic inflammatory response syndrome (SIRS) of non-infectious origin without acute organ dysfunction: Secondary | ICD-10-CM | POA: Diagnosis not present

## 2022-11-14 LAB — CBC WITH DIFFERENTIAL/PLATELET
Abs Immature Granulocytes: 0.04 10*3/uL (ref 0.00–0.07)
Basophils Absolute: 0 10*3/uL (ref 0.0–0.1)
Basophils Relative: 0 %
Eosinophils Absolute: 0 10*3/uL (ref 0.0–0.5)
Eosinophils Relative: 0 %
HCT: 29.2 % — ABNORMAL LOW (ref 36.0–46.0)
Hemoglobin: 9.8 g/dL — ABNORMAL LOW (ref 12.0–15.0)
Immature Granulocytes: 0 %
Lymphocytes Relative: 18 %
Lymphs Abs: 1.9 10*3/uL (ref 0.7–4.0)
MCH: 30.3 pg (ref 26.0–34.0)
MCHC: 33.6 g/dL (ref 30.0–36.0)
MCV: 90.4 fL (ref 80.0–100.0)
Monocytes Absolute: 0.5 10*3/uL (ref 0.1–1.0)
Monocytes Relative: 5 %
Neutro Abs: 8.2 10*3/uL — ABNORMAL HIGH (ref 1.7–7.7)
Neutrophils Relative %: 77 %
Platelets: 155 10*3/uL (ref 150–400)
RBC: 3.23 MIL/uL — ABNORMAL LOW (ref 3.87–5.11)
RDW: 13.2 % (ref 11.5–15.5)
WBC: 10.7 10*3/uL — ABNORMAL HIGH (ref 4.0–10.5)
nRBC: 0 % (ref 0.0–0.2)

## 2022-11-14 LAB — MRSA NEXT GEN BY PCR, NASAL: MRSA by PCR Next Gen: NOT DETECTED

## 2022-11-14 MED ORDER — SODIUM CHLORIDE 0.9 % IV SOLN
1.0000 g | INTRAVENOUS | Status: AC
  Administered 2022-11-14 – 2022-11-16 (×3): 1 g via INTRAVENOUS
  Filled 2022-11-14 (×3): qty 10

## 2022-11-14 MED ORDER — ACETAMINOPHEN 325 MG PO TABS
650.0000 mg | ORAL_TABLET | Freq: Four times a day (QID) | ORAL | Status: DC | PRN
  Administered 2022-11-14 – 2022-11-15 (×2): 650 mg via ORAL
  Filled 2022-11-14 (×2): qty 2

## 2022-11-14 MED ORDER — SODIUM CHLORIDE 0.9 % IV BOLUS: 1000.0000 mL | Freq: Once | INTRAVENOUS | Status: DC

## 2022-11-14 MED ORDER — LACTATED RINGERS IV BOLUS
500.0000 mL | Freq: Once | INTRAVENOUS | Status: AC
  Administered 2022-11-14: 500 mL via INTRAVENOUS

## 2022-11-14 MED ORDER — SODIUM CHLORIDE 0.9 % IV BOLUS
1000.0000 mL | Freq: Once | INTRAVENOUS | Status: AC
  Administered 2022-11-14: 1000 mL via INTRAVENOUS

## 2022-11-14 MED ORDER — METOPROLOL TARTRATE 25 MG PO TABS
25.0000 mg | ORAL_TABLET | Freq: Four times a day (QID) | ORAL | Status: DC
  Administered 2022-11-14 – 2022-11-15 (×4): 25 mg via ORAL
  Filled 2022-11-14 (×4): qty 1

## 2022-11-14 NOTE — Progress Notes (Signed)
PROGRESS NOTE    Patient: Lisa Thornton                            PCP: Anabel Halon, MD                    DOB: 1955-05-19            DOA: 11/13/2022 ZOX:[id number]             DOS: 11/14/2022, 2:22 PM   LOS: 0 days   Date of Service: The patient was seen and examined on 11/14/2022  Subjective:   The patient was seen and examined this morning. Overnight patient was febrile, met SIRS criteria, broad-spectrum antibiotics were initiated, blood and urine cultures were obtained.  Patient was found hemodynamically stable, awake alert oriented in no acute distress denies any chest pain shortness of breath  Brief Narrative:   Lisa Thornton 67 year old female history of breast cancer status post left mastectomy, history of A-fib, cardiomyopathy EF of 45-50%, type 2 diabetes with neuropathy, hypertension, OSA, hyperlipidemia presents to the ER today with a 2-day history of feeling fatigued.  No palpitations.  Patient has missed 2 days worth of Eliquis.  She decided not to take it.  No reason given.  Denies any chest pain.  Shortness of breath.  Came to the ER for evaluation.  In ED:   Temp 99.5 heart rate 121 blood pressure 102/63 satting 97% on room air.   White count 10.6, hemoglobin 0.2, platelets of 182  Lactic acid 1.4  Sodium 132, potassium 3.2, chloride 98, bicarb 21, BUN of 40, creatinine 1.48 BNP of 82   Chest x-ray shows no acute cardiopulmonary disease.    EKG my interpretation shows rapid atrial fibrillation. Patient started on IV Cardizem.    Assessment & Plan:   Principal Problem:   Atrial fibrillation with rapid ventricular response (HCC) Active Problems:   SIRS (systemic inflammatory response syndrome) (HCC)   Hypokalemia   Essential hypertension   Type 2 diabetes mellitus with diabetic neuropathy, unspecified (HCC)   HLD (hyperlipidemia)   Cardiomyopathy (HCC)   OSA (obstructive sleep apnea)     Assessment and Plan: * Atrial fibrillation with rapid  ventricular response (HCC) - Continue to monitor on stepdown unit -Continue with IV cardizem gtts. Restart po eliquis(only missed 2 days).  - Pt was taken off amiodarone by cardiology during April 2024  office visit. Office EKG showed pt was still in afib.   -Patient's home medication of metoprolol and amlodipine -was on hold due to soft BP -Cardiology added metoprolol back today  - Echo in 5/ 2024. EJF 45-50%, LV mildly decreased function, LV cavity normal, no LVH - Restart lopressor but at reduced dose due to borderline BP.  - Replete K and Mg. Keep K >4.0 and Mg >2.0 -Prescient cardiology following closely  SIRS (systemic inflammatory response syndrome) (HCC) -Overnight patient met SIRS criteria -Tmax of 101.1, pulse 134, -WBC 10.6, creatinine 1.48,   Current vitals blood pressure (!) 105/53, pulse 88, temperature 98 F (36.7 C), rate (!) 26,  SpO2 96%.  -Ruling out sepsis -UA revealed leukocyte esterase, positive nitrite many bacteria, chest x-ray clear  SIRS likely due to UTI  -Blood and urine cultures were obtained, patient started on spectrum antibiotics of Rocephin  Hypokalemia Replete K. Keep K > 4.0  OSA (obstructive sleep apnea) CPAP at night.  Cardiomyopathy (HCC) LVEF 45-50% on echo 08-2022.  Updated with limited echo No LV dysfunction or LVH, no signs of diastolic failure  HLD (hyperlipidemia) Continue crestor 5 mg  Type 2 diabetes mellitus with diabetic neuropathy, unspecified (HCC) Hold metformin, rybelsus. Add SSI. Check A1c.  Essential hypertension Stable. Hold norvasc.  ----------------------------------------------------------------------------------------------------------------------------------------- Nutritional status:  The patient's BMI is: Body mass index is 24.98 kg/m. I agree with the assessment and plan as  outlined  ----------------------------------------------------------------------------------------------------------------------------------------  DVT prophylaxis:     Code Status:   Code Status: Full Code  Family Communication: No family member present at bedside- attempt will be made to update daily The above findings and plan of care has been discussed with patient (and family)  in detail,  they expressed understanding and agreement of above. -Advance care planning has been discussed.   Admission status:   Status is: Observation The patient remains OBS appropriate and will d/c before 2 midnights.   Disposition: From  - home             Planning for discharge in 1-2 days: to   Procedures:   No admission procedures for hospital encounter.   Antimicrobials:  Anti-infectives (From admission, onward)    Start     Dose/Rate Route Frequency Ordered Stop   11/14/22 0615  cefTRIAXone (ROCEPHIN) 1 g in sodium chloride 0.9 % 100 mL IVPB        1 g 200 mL/hr over 30 Minutes Intravenous Every 24 hours 11/14/22 0528          Medication:   Chlorhexidine Gluconate Cloth  6 each Topical Daily   metoprolol tartrate  25 mg Oral Q6H    acetaminophen   Objective:   Vitals:   11/14/22 1215 11/14/22 1230 11/14/22 1300 11/14/22 1400  BP:   (!) 109/52 (!) 105/53  Pulse: (!) 104 87 82 88  Resp: 15 16 15  (!) 26  Temp:      TempSrc:      SpO2: 97% 98% 98% 96%  Weight:      Height:        Intake/Output Summary (Last 24 hours) at 11/14/2022 1422 Last data filed at 11/14/2022 1100 Gross per 24 hour  Intake 1229.37 ml  Output --  Net 1229.37 ml   Filed Weights   11/13/22 2100 11/14/22 0524  Weight: 67 kg 66 kg     Physical examination:   Constitution:  Alert, cooperative, no distress,  Appears calm and comfortable  Psychiatric:   Normal and stable mood and affect, cognition intact,   HEENT:        Normocephalic, PERRL, otherwise with in Normal limits  Chest:          Chest symmetric Cardio vascular:  S1/S2, irregular irregular no murmure, No Rubs or Gallops  pulmonary: Clear to auscultation bilaterally, respirations unlabored, negative wheezes / crackles Abdomen: Soft, non-tender, non-distended, bowel sounds,no masses, no organomegaly Muscular skeletal: Limited exam - in bed, able to move all 4 extremities,   Neuro: CNII-XII intact. , normal motor and sensation, reflexes intact  Extremities: No pitting edema lower extremities, +2 pulses  Skin: Dry, warm to touch, negative for any Rashes, No open wounds Wounds: per nursing documentation   ------------------------------------------------------------------------------------------------------------------------------------------    LABs:     Latest Ref Rng & Units 11/14/2022    7:28 AM 11/13/2022    2:40 PM 08/07/2022    9:30 AM  CBC  WBC 4.0 - 10.5 K/uL 10.7  10.6  10.9   Hemoglobin 12.0 - 15.0 g/dL 9.8  11.2  11.5   Hematocrit 36.0 - 46.0 % 29.2  32.8  36.0   Platelets 150 - 400 K/uL 155  182  202       Latest Ref Rng & Units 11/13/2022    2:40 PM 09/06/2022    2:12 PM 08/07/2022    9:30 AM  CMP  Glucose 70 - 99 mg/dL 324  401  027   BUN 8 - 23 mg/dL 40  27  23   Creatinine 0.44 - 1.00 mg/dL 2.53  6.64  4.03   Sodium 135 - 145 mmol/L 132  143  141   Potassium 3.5 - 5.1 mmol/L 3.2  4.0  3.8   Chloride 98 - 111 mmol/L 98  98  96   CO2 22 - 32 mmol/L 21  28  26    Calcium 8.9 - 10.3 mg/dL 8.9  47.4  25.9   Total Protein 6.5 - 8.1 g/dL 7.7     Total Bilirubin 0.3 - 1.2 mg/dL 0.7     Alkaline Phos 38 - 126 U/L 55     AST 15 - 41 U/L 69     ALT 0 - 44 U/L 25          Micro Results Recent Results (from the past 240 hour(s))  Blood Culture (routine x 2)     Status: None (Preliminary result)   Collection Time: 11/13/22  2:40 PM   Specimen: BLOOD RIGHT ARM  Result Value Ref Range Status   Specimen Description BLOOD RIGHT ARM  Final   Special Requests   Final    BOTTLES DRAWN AEROBIC AND  ANAEROBIC Blood Culture adequate volume Performed at [facility], [address], [place], [place] [address]    Culture PENDING  Incomplete   Report Status PENDING  Incomplete  Blood Culture (routine x 2)     Status: None (Preliminary result)   Collection Time: 11/13/22  2:55 PM   Specimen: BLOOD RIGHT ARM  Result Value Ref Range Status   Specimen Description BLOOD RIGHT ARM  Final   Special Requests   Final    BOTTLES DRAWN AEROBIC AND ANAEROBIC Blood Culture adequate volume Performed at [facility], [address], [place], [place] [address]    Culture PENDING  Incomplete   Report Status PENDING  Incomplete    Radiology Reports DG Chest Port 1 View  Result Date: 11/13/2022 CLINICAL DATA:  Sepsis. EXAM: PORTABLE CHEST 1 VIEW COMPARISON:  X-ray 08/10/2022 and older FINDINGS: Right subclavian chest port with tip along the SVC brachiocephalic junction region, unchanged from prior. Underinflation. No consolidation, pneumothorax or effusion. No edema. Normal cardiopericardial silhouette. Overlapping cardiac leads surgical clips in the left axillary region. Old rib fractures. IMPRESSION: Underinflation.  No acute cardiopulmonary disease.  Chest port Electronically Signed   By: Karen Kays M.D.   On: 11/13/2022 15:31    SIGNED: Kendell Bane, MD, FHM. FAAFP. [facility] - [facility] hospitalist Time spent - 55 min critical care time was spent   In seeing, evaluating and examining the patient. Reviewing medical records, labs, drawn plan of care. [facility],  Pager (please use [email or web address] to page/ text) Please use Epic Secure Chat for non-urgent communication (7AM-7PM)  If 7PM-7AM, please contact night-coverage [email or web address], 11/14/2022, 2:22 PM

## 2022-11-14 NOTE — Assessment & Plan Note (Signed)
-  Overnight patient met SIRS criteria -Tmax of 101.1, pulse 134, -WBC 10.6, creatinine 1.48,   Current vitals blood pressure (!) 105/53, pulse 88, temperature 98 F (36.7 C), rate (!) 26,  SpO2 96%.  -Ruling out sepsis -UA revealed leukocyte esterase, positive nitrite many bacteria, chest x-ray clear  SIRS likely due to UTI  -Blood and urine cultures were obtained, patient started on spectrum antibiotics of Rocephin

## 2022-11-14 NOTE — Consult Note (Addendum)
Cardiology Consultation   Patient ID: Lisa Thornton MRN: [id number]; DOB: 1955-10-05  Admit date: 11/13/2022 Date of Consult: 11/14/2022  PCP:  Anabel Halon, MD   [facility] Providers Cardiologist:  Marjo Bicker, MD        Patient Profile:   Lisa Thornton is a 67 y.o. female with a hx of PAF, CHF, CM,  who is being seen 11/14/2022 for the evaluation of Afib with RVR at the request of Dr. Flossie Dibble.  History of Present Illness:   Lisa Thornton is a 67 yr old with history of PAF(possibly persistent) hospitalized 07/2022 with RVR in the setting of sepsis pneumonia. She had low BP and was started on Amiodarone hopefully short term. EF 45-50% on echo. At office f/u. Amio was stopped and f/u limited echo 08/30/22 afib 120's and EF 45-50%.  Patient comes in with 2 day history of fatigue and SOB per chart review but she says she came in with pink eye and eyes hurting. She has missed 2 doses of eliquis because she was laying on the floor and too weak to get up. She thinks she is in NSR sometimes. Fever 99.5 BP 102/63, Afib RVR, K 3.2.     Past Medical History:  Diagnosis Date   Anemia 12/31/2017   Cancer (HCC)    left breast cancer   Chest pain    Diabetes mellitus without complication (HCC)    Hypertension     Past Surgical History:  Procedure Laterality Date   COLONOSCOPY WITH PROPOFOL N/A 09/27/2021   Procedure: COLONOSCOPY WITH PROPOFOL;  Surgeon: Dolores Frame, MD;  [facility];  Service: Gastroenterology;  Laterality: N/A;  945   MASTECTOMY MODIFIED RADICAL Left 12/31/2017   Procedure: LEFT MODIFIED RADICAL MASTECTOMY;  Surgeon: Franky Macho, MD;  Location: [facility];  Service: General;  Laterality: Left;   POLYPECTOMY  09/27/2021   Procedure: POLYPECTOMY;  Surgeon: Dolores Frame, MD;  Location: [facility];  Service: Gastroenterology;;   PORTACATH PLACEMENT Right 06/27/2017   Procedure: INSERTION PORT-A-CATH;  Surgeon: Franky Macho, MD;  Location: [facility];  Service: General;  Laterality: Right;     Home Medications:  Prior to Admission medications   Medication Sig Start Date End Date Taking? Authorizing Provider  amLODipine (NORVASC) 5 MG tablet Take 5 mg by mouth daily. 08/15/22  Yes [provider]  anastrozole (ARIMIDEX) 1 MG tablet Take 1 tablet (1 mg total) by mouth daily. 10/09/22  Yes Anabel Halon, MD  apixaban (ELIQUIS) 5 MG TABS tablet Take 1 tablet (5 mg total) by mouth 2 (two) times daily. 04/26/22  Yes Anabel Halon, MD  atorvastatin (LIPITOR) 10 MG tablet Take 1 tablet (10 mg total) by mouth at bedtime. 08/24/22  Yes Anabel Halon, MD  calcium carbonate (OS-CAL) 600 MG TABS tablet Take 600 mg by mouth daily with breakfast.   Yes [provider]  cholecalciferol (VITAMIN D3) 25 MCG (1000 UT) tablet Take 1,000 Units by mouth daily.   Yes [provider]  furosemide (LASIX) 40 MG tablet Take 0.5 tablets (20 mg total) by mouth daily. 09/07/22  Yes Anabel Halon, MD  magnesium oxide (MAG-OX) 400 (240 Mg) MG tablet Take 1 tablet (400 mg total) by mouth daily. 07/10/22  Yes Tat, Onalee Hua, MD  metFORMIN (GLUCOPHAGE) 1000 MG tablet Take 1 tablet (1,000 mg total) by mouth daily with breakfast. 09/06/22  Yes Anabel Halon, MD  metoprolol tartrate (LOPRESSOR) 50  MG tablet Take 1 tablet (50 mg total) by mouth 2 (two) times daily. 09/06/22  Yes Patel, Earlie Lou, MD  Semaglutide (RYBELSUS) 7 MG TABS Take 1 tablet (7 mg total) by mouth daily. 04/26/22  Yes Anabel Halon, MD  Accu-Chek Softclix Lancets lancets USE 1  TO CHECK GLUCOSE UP TO 4 TIMES DAILY 08/16/20   Heather Roberts, NP  blood glucose meter kit and supplies Dispense based on patient and insurance preference. Use up to four times daily as directed. (FOR ICD-10 E10.9, E11.9). 10/29/19   Freddy Finner, NP  glucose blood (ACCU-CHEK GUIDE) test strip USE 1 STRIP TO CHECK GLUCOSE THREE TIMES DAILY IN THE MORNING, AT NOON, AND AT BEDTIME AS  DIRECTED 09/26/22   Anabel Halon, MD  Omega-3 Fatty Acids (FISH OIL) 1000 MG CAPS Take 1 capsule by mouth 2 (two) times daily. Patient not taking: Reported on 11/13/2022    [provider]  UNABLE TO FIND 1 each by Does not apply route daily. Med Name: Shower Chair 08/16/22   Anabel Halon, MD  UNABLE TO FIND 1 each by Does not apply route daily. Med Name: Bed side Commode to fix over toilet seat 08/24/22   Anabel Halon, MD    Inpatient Medications: Scheduled Meds:  Chlorhexidine Gluconate Cloth  6 each Topical Daily   Continuous Infusions:  cefTRIAXone (ROCEPHIN)  IV 1 g (11/14/22 0544)   diltiazem (CARDIZEM) infusion 5 mg/hr (11/14/22 0344)   PRN Meds: acetaminophen  Allergies:   No Known Allergies  Social History:   Social History   Socioeconomic History   Marital status: Divorced    Spouse name: Not on file   Number of children: 2   Years of education: 10   Highest education level: 10th grade  Occupational History   Occupation: Psychologist, 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  Tobacco Use   Smoking status: Never   Smokeless tobacco: Never  Vaping Use   Vaping status: Never Used  Substance and Sexual Activity   Alcohol use: No   Drug use: No   Sexual activity: Not Currently  Other Topics Concern   Not on file  Social History Narrative   Lives with sister   2 children   Dog: Cozie      Enjoys: puzzles, sewing, and walk      Diet: eats all food groups outside: leafy greens    Caffeine: limited, sweet tea   Water: 6-8 cups daily       No car; does have license, wears seat belt   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   [place] area    Social Determinants of Health   Financial Resource Strain: Low Risk  (03/29/2021)   Overall Financial Resource Strain (CARDIA)    Difficulty of Paying Living Expenses: Not hard at all  Food Insecurity: No Food Insecurity (11/13/2022)   Hunger Vital Sign    Worried About Running Out of Food in the Last Year: Never true    Ran Out of Food in the Last Year:  Never true  Transportation Needs: No Transportation Needs (11/13/2022)   PRAPARE - Administrator, Civil Service (Medical): No    Lack of Transportation (Non-Medical): No  Physical Activity: Insufficiently Active (03/29/2021)   Exercise Vital Sign    Days of Exercise per Week: 7 days    Minutes of Exercise per Session: 20 min  Stress: No Stress Concern Present (03/29/2021)   [employer] of Occupational Health - Occupational Stress Questionnaire  Feeling of Stress : Not at all  Social Connections: Socially Isolated (03/29/2021)   Social Connection and Isolation Panel [NHANES]    Frequency of Communication with Friends and Family: More than three times a week    Frequency of Social Gatherings with Friends and Family: Once a week    Attends Religious Services: Never    Database administrator or Organizations: No    Attends Banker Meetings: Never    Marital Status: Divorced  Catering manager Violence: Not At Risk (11/13/2022)   Humiliation, Afraid, Rape, and Kick questionnaire    Fear of Current or Ex-Partner: No    Emotionally Abused: No    Physically Abused: No    Sexually Abused: No    Family History:     Family History  Problem Relation Age of Onset   Breast cancer Mother    Thyroid disease Mother    Heart disease Mother    Heart attack Father    Heart attack Sister    Hypertension Sister    Heart attack Brother    Cancer Sister    Stroke Brother    Alzheimer's disease Maternal Aunt      ROS:  Please see the history of present illness.  Review of Systems  Constitutional: Positive for fever and malaise/fatigue.  Eyes:  Positive for discharge, pain and redness.  Cardiovascular:  Positive for dyspnea on exertion, irregular heartbeat and leg swelling.  Neurological:  Positive for weakness.    All other ROS reviewed and negative.     Physical Exam/Data:   Vitals:   11/14/22 0700 11/14/22 0730 11/14/22 0734 11/14/22 0830  BP: (!)  102/34 (!) 112/50  (!) 100/36  Pulse: 93 (!) 106 99 96  Resp: 19 17 17 20   Temp:   98.8 F (37.1 C)   TempSrc:   Oral   SpO2: 96% 94% 94% 94%  Weight:      Height:        Intake/Output Summary (Last 24 hours) at 11/14/2022 0851 Last data filed at 11/14/2022 0344 Gross per 24 hour  Intake 62.61 ml  Output --  Net 62.61 ml      11/14/2022    5:24 AM 11/13/2022    9:00 PM 09/06/2022    1:32 PM  Last 3 Weights  Weight (lbs) 145 lb 8.1 oz 147 lb 11.3 oz 143 lb  Weight (kg) 66 kg 67 kg 64.864 kg     Body mass index is 24.98 kg/m.  General:  Well nourished, well developed, in no acute distress  HEENT: likely pink eye left eye, red, swollen, discharge Neck: no JVD Vascular: No carotid bruits; Distal pulses 2+ bilaterally Cardiac:  normal S1, S2; irreg irreg no murmur Lungs:  clear to auscultation bilaterally, no wheezing, rhonchi or rales  Abd: soft, nontender, no hepatomegaly  Ext: trace edema Musculoskeletal:  No deformities, BUE and BLE strength normal and equal Skin: warm and dry  Neuro:  CNs 2-12 intact, no focal abnormalities noted Psych:  Normal affect   EKG:  The EKG was personally reviewed and demonstrates:  Afib with RVR nonspecific ST changes Telemetry:  Telemetry was personally reviewed and demonstrates:  Afib 100-140/m  Relevant CV Studies:  Echo 08/30/22 IMPRESSIONS     1. Right ventricular systolic function is normal. The right ventricular  size is normal. There is normal pulmonary artery systolic pressure.   2. Limited echo to evaluate LV function. Patient in afib rates 120s  limiting LVEF interpretation. Consider  repeat study with improved rate  control for better assessment of LVEF   3. Left ventricular ejection fraction, by estimation, is 45 to 50%. The  left ventricle has mildly decreased function.  Echocardiogram: 07/05/2022 IMPRESSIONS     1. Challenging study for LVEF assessment due to tachycardia. Left  ventricular ejection fraction, by  estimation, is 45 to 50%. The left  ventricle has mildly decreased function. Left ventricular endocardial  border not optimally defined to evaluate  regional wall motion. Left ventricular diastolic function could not be  evaluated due to atrial fibrillation.   2. Right ventricular systolic function was not well visualized. The right  ventricular size is mildly enlarged.   3. Left atrial size was moderately dilated.   4. The mitral valve is grossly normal. Trivial mitral valve  regurgitation. No evidence of mitral stenosis.   5. The aortic valve is tricuspid. There is mild calcification of the  aortic valve. Aortic valve regurgitation is not visualized. No aortic  stenosis is present.   Comparison(s): Changes from prior study are noted. LVEF worsened from 55%  in 2020 to 45-50% now.   Laboratory Data:  High Sensitivity Troponin:   Recent Labs  Lab 11/13/22 1440 11/13/22 1709  TROPONINIHS 15 14     Chemistry Recent Labs  Lab 11/13/22 1440  NA 132*  K 3.2*  CL 98  CO2 21*  GLUCOSE 192*  BUN 40*  CREATININE 1.48*  CALCIUM 8.9  MG 1.8  GFRNONAA 38*  ANIONGAP 13    Recent Labs  Lab 11/13/22 1440  PROT 7.7  ALBUMIN 3.3*  AST 69*  ALT 25  ALKPHOS 55  BILITOT 0.7   Lipids No results for input(s): "CHOL", "TRIG", "HDL", "LABVLDL", "LDLCALC", "CHOLHDL" in the last 168 hours.  Hematology Recent Labs  Lab 11/13/22 1440 11/14/22 0728  WBC 10.6* 10.7*  RBC 3.65* 3.23*  HGB 11.2* 9.8*  HCT 32.8* 29.2*  MCV 89.9 90.4  MCH 30.7 30.3  MCHC 34.1 33.6  RDW 13.1 13.2  PLT 182 155   Thyroid No results for input(s): "TSH", "FREET4" in the last 168 hours.  BNP Recent Labs  Lab 11/13/22 1440  BNP 82.0    DDimer No results for input(s): "DDIMER" in the last 168 hours.   Radiology/Studies:  DG Chest Port 1 View  Result Date: 11/13/2022 CLINICAL DATA:  Sepsis. EXAM: PORTABLE CHEST 1 VIEW COMPARISON:  X-ray 08/10/2022 and older FINDINGS: Right subclavian chest port  with tip along the SVC brachiocephalic junction region, unchanged from prior. Underinflation. No consolidation, pneumothorax or effusion. No edema. Normal cardiopericardial silhouette. Overlapping cardiac leads surgical clips in the left axillary region. Old rib fractures. IMPRESSION: Underinflation.  No acute cardiopulmonary disease.  Chest port Electronically Signed   By: Karen Kays M.D.   On: 11/13/2022 15:31     Assessment and Plan:   Atrial fibrillation with RVR,has been on Amiodarone in past due to hypotension but stopped in April. She will probably need again as BP is low. Rate stable on IV diltiazem so could add metoprolol back. She missed 2 doses of Eliquis. Consider DCCV on amio as outpatient once loaded. PTA on metoprolol 50 mg bid and amlodipine 5 mg daily both on hold due to low BP.  New CMEF 45-50% on echo 07/2022, limited repeat echo 08/30/22 EF 45-50% but in afib 120's. Repeat limited echo once in NSR or rate controlled -trace ankle edema,    HTN-BP running low  DM2  Suspected pink eye-per primary team.  CKD Crt 1.48  Anemia-hgb 9.8  History of breast CA-finished treatment last year but still has a port.  UTI-on rocephin    Risk Assessment/Risk Scores:          CHA2DS2-VASc Score = 5   This indicates a 7.2% annual risk of stroke. The patient's score is based upon: CHF History: 1 HTN History: 1 Diabetes History: 1 Stroke History: 0 Vascular Disease History: 0 Age Score: 1 Gender Score: 1         For questions or updates, please contact [facility] Please consult [email or web address] for contact info under    Signed, Jacolyn Reedy, PA-C  11/14/2022 8:51 AM  Attending note  Patient seen and discussed with PA Geni Bers, I agree with her documentation. 67 yo female history of PAF, HTN,DM2, breast CA, chronic HFmrEF presented with fatigue.   Admit 07/2022 afib with RVR in setting of sepsis/pneumonia. Low bp's on dilt, changed to amio with plans for  short course. Amio was stopped 08/01/22 at outpatient appt, was in rate controlled afib at the time.    Lactic acid 1.4 WBC 10.6 Hgb 11.2 Plt 182 BNP 82 Mg 1.8 K 3.2 Cr 1.48 GFR 38 Trop 15--> CXR no acute process EKG afib 130  08/2022 echo: LVEF 45-50% though limited eval given afib 120s  1.Afib, probably persistent  - prior PAF, I don't see clearly that she has been in SR since 01/2018 by EKGs in epic.  - current RVR may be secondary to UTI - at home on lopressor 50mg  bid for rate control.  -from ER note mised few days of eliquis  -started on dilt gtt on admission, on 7.5 with rates low 100s and SBP low 100s - start lopressor 25mg  every 6 hours with hold parameters. Bolus additional 1L NS to help with bp's. If bp's intolerant to av nodal agents could start amio gtt similar to prior admission and would plan short course  - would plan cardiac monitor at outpatient f/u. Need to see how well afib is controlled in absence of infection and if any long term management changes are needed.     2. UTI - Tmax 101.1, UA dirty with culture pending - abx per primary team.    3.HFmrEF - echos have been limited as they have been done in setting of afib with RVR - she needs an echo when rates have been controlled, may do inpatient vs outpatient depending on rates this admission.  . Would plan as outpatient. If true dysfunction then commit to HF regimen at that point.     Dina Rich MD

## 2022-11-14 NOTE — Progress Notes (Signed)
Dr.Shahmehdi and Terrilyn Saver notified that pt.says she has pink eye. Staff unaware and have been in room.  Tanya notified to inquire on how we should proceed with precautions.

## 2022-11-14 NOTE — Progress Notes (Signed)
Made sure patient's CPAP machine was placed near bed for patient to place herself on.  Unit plugged into red outlet and ready for patient.

## 2022-11-14 NOTE — Progress Notes (Signed)
Notified that patient is now febrile.   She was hypotensive in ED and has multiple SIRS criteria. She reports dysuria. Blood and urine cultures had already been collected in ED. Plan to start Rocephin.

## 2022-11-14 NOTE — TOC Initial Note (Signed)
Transition of Care [facility]) - Initial/Assessment Note    Patient Details  Name: Lisa Thornton MRN: [id number] Date of Birth: 1956-02-25  [facility]) CM/SW Contact:    Villa Herb, LCSWA Phone Number: 11/14/2022, 11:17 AM  Clinical Narrative:                 [facility] consulted for HH/DME needs. CSW spoke with pt at bedside to complete assessment. Pt lives alone and is independent in completing her ADLs. Pt states that her niece gets her to appointments as needed. Pt has a walker to use when needed. Pt states that she has had HH in the past but is unsure what agency. TOC to follow.   Expected Discharge Plan: Home/Self Care Barriers to Discharge: Continued Medical Work up   Patient Goals and CMS Choice Patient states their goals for this hospitalization and ongoing recovery are:: return home CMS Medicare.gov Compare Post Acute Care list provided to:: Patient Choice offered to / list presented to : Patient      Expected Discharge Plan and Services In-house Referral: Clinical Social Work Discharge Planning Services: CM Consult   Living arrangements for the past 2 months: Single Family Home                                      Prior Living Arrangements/Services Living arrangements for the past 2 months: Single Family Home Lives with:: Self Patient language and need for interpreter reviewed:: Yes Do you feel safe going back to the place where you live?: Yes      Need for Family Participation in Patient Care: Yes (Comment) Care giver support system in place?: Yes (comment) Current home services: DME Criminal Activity/Legal Involvement Pertinent to Current Situation/Hospitalization: No - Comment as needed  Activities of Daily Living Home Assistive Devices/Equipment: Walker (specify type), Eyeglasses, Bedside commode/3-in-1, Shower chair with back, CBG Meter, CPAP ADL Screening (condition at time of admission) Patient's cognitive ability adequate to safely  complete daily activities?: Yes Is the patient deaf or have difficulty hearing?: No Does the patient have difficulty seeing, even when wearing glasses/contacts?: No Does the patient have difficulty concentrating, remembering, or making decisions?: No Patient able to express need for assistance with ADLs?: Yes Does the patient have difficulty dressing or bathing?: Yes Independently performs ADLs?: No Communication: Independent Dressing (OT): Needs assistance Is this a change from baseline?: Change from baseline, expected to last <3days Grooming: Independent Feeding: Independent Bathing: Needs assistance Is this a change from baseline?: Change from baseline, expected to last <3 days Toileting: Needs assistance Is this a change from baseline?: Change from baseline, expected to last <3 days In/Out Bed: Needs assistance Is this a change from baseline?: Change from baseline, expected to last <3 days Walks in Home: Independent Does the patient have difficulty walking or climbing stairs?: Yes Weakness of Legs: Both Weakness of Arms/Hands: None  Permission Sought/Granted                  Emotional Assessment Appearance:: Appears stated age Attitude/Demeanor/Rapport: Engaged Affect (typically observed): Accepting Orientation: : Oriented to Self, Oriented to Place, Oriented to Situation, Oriented to  Time Alcohol / Substance Use: Not Applicable Psych Involvement: No (comment)  Admission diagnosis:  Hypokalemia [E87.6] Atrial fibrillation with rapid ventricular response (HCC) [I48.91] Generalized weakness [R53.1] Atrial fibrillation with RVR (HCC) [I48.91] Acute conjunctivitis of left eye, unspecified acute conjunctivitis type [H10.32] Patient  Active Problem List   Diagnosis Date Noted   OSA (obstructive sleep apnea) 09/06/2022   Iron deficiency anemia 08/07/2022   HLD (hyperlipidemia) 08/01/2022   Cardiomyopathy (HCC) 08/01/2022   Chronic diastolic heart failure (HCC)  16/01/9603   Atrial fibrillation with rapid ventricular response (HCC) 07/03/2022   Hypokalemia 03/17/2021   Onychomycosis 03/17/2021   Encounter for general adult medical examination with abnormal findings 11/11/2020   Fatigue 11/11/2020   Type 2 diabetes mellitus with diabetic neuropathy, unspecified (HCC) 10/29/2019   S/P mastectomy, left 12/31/2017   Atrial fibrillation (HCC) 12/31/2017   Malignant neoplasm of central portion of left female breast [facility])    Essential hypertension 06/22/2017   Breast cancer of upper-outer quadrant of left female breast (HCC) 06/22/2017   PCP:  Anabel Halon, MD Pharmacy:   [facility] [address] [place] - [address] [place] [address] Phone: [phone] Fax: [phone]  [facility] - [place], [place] - [address]  [place] [address] Phone: [phone] Fax: [phone]     Social Determinants of Health (SDOH) Social History: SDOH Screenings   Food Insecurity: No Food Insecurity (11/13/2022)  Housing: Low Risk  (11/13/2022)  Transportation Needs: No Transportation Needs (11/13/2022)  Utilities: Not At Risk (11/13/2022)  Alcohol Screen: Low Risk  (03/25/2020)  Depression (PHQ2-9): Low Risk  (09/06/2022)  Financial Resource Strain: Low Risk  (03/29/2021)  Physical Activity: Insufficiently Active (03/29/2021)  Social Connections: Socially Isolated (03/29/2021)  Stress: No Stress Concern Present (03/29/2021)  Tobacco Use: Low Risk  (09/06/2022)   SDOH Interventions:     Readmission Risk Interventions     No data to display

## 2022-11-14 NOTE — Hospital Course (Signed)
Lisa Thornton 67 year old female history of breast cancer status post left mastectomy, history of A-fib, cardiomyopathy EF of 45-50%, type 2 diabetes with neuropathy, hypertension, OSA, hyperlipidemia presents to the ER today with a 2-day history of feeling fatigued.  No palpitations.  Patient has missed 2 days worth of Eliquis.  She decided not to take it.  No reason given.  Denies any chest pain.  Shortness of breath.  Came to the ER for evaluation.  In ED:   Temp 99.5 heart rate 121 blood pressure 102/63 satting 97% on room air.   White count 10.6, hemoglobin 0.2, platelets of 182  Lactic acid 1.4  Sodium 132, potassium 3.2, chloride 98, bicarb 21, BUN of 40, creatinine 1.48 BNP of 82   Chest x-ray shows no acute cardiopulmonary disease.    EKG my interpretation shows rapid atrial fibrillation. Patient started on IV Cardizem.

## 2022-11-15 ENCOUNTER — Inpatient Hospital Stay (HOSPITAL_COMMUNITY): Payer: 59

## 2022-11-15 ENCOUNTER — Encounter (HOSPITAL_COMMUNITY): Payer: Self-pay | Admitting: Family Medicine

## 2022-11-15 DIAGNOSIS — I4891 Unspecified atrial fibrillation: Secondary | ICD-10-CM | POA: Diagnosis not present

## 2022-11-15 DIAGNOSIS — I509 Heart failure, unspecified: Secondary | ICD-10-CM

## 2022-11-15 DIAGNOSIS — E876 Hypokalemia: Secondary | ICD-10-CM | POA: Diagnosis not present

## 2022-11-15 DIAGNOSIS — R531 Weakness: Secondary | ICD-10-CM | POA: Diagnosis not present

## 2022-11-15 DIAGNOSIS — I429 Cardiomyopathy, unspecified: Secondary | ICD-10-CM | POA: Diagnosis not present

## 2022-11-15 LAB — ECHOCARDIOGRAM LIMITED
Calc EF: 50.4 %
Est EF: 45
Height: 64 in
S' Lateral: 2.8 cm
Single Plane A2C EF: 48.7 %
Single Plane A4C EF: 50.6 %
Weight: 2373.91 oz

## 2022-11-15 LAB — GLUCOSE, CAPILLARY
Glucose-Capillary: 188 mg/dL — ABNORMAL HIGH (ref 70–99)
Glucose-Capillary: 109 mg/dL — ABNORMAL HIGH (ref 70–99)
Glucose-Capillary: 120 mg/dL — ABNORMAL HIGH (ref 70–99)

## 2022-11-15 MED ORDER — METOPROLOL TARTRATE 50 MG PO TABS
50.0000 mg | ORAL_TABLET | Freq: Two times a day (BID) | ORAL | Status: DC
  Administered 2022-11-15 – 2022-11-16 (×2): 50 mg via ORAL
  Filled 2022-11-15 (×2): qty 1

## 2022-11-15 MED ORDER — METOPROLOL TARTRATE 25 MG PO TABS
25.0000 mg | ORAL_TABLET | Freq: Once | ORAL | Status: AC
  Administered 2022-11-15: 25 mg via ORAL
  Filled 2022-11-15: qty 1

## 2022-11-15 MED ORDER — APIXABAN 5 MG PO TABS
5.0000 mg | ORAL_TABLET | Freq: Two times a day (BID) | ORAL | Status: DC
  Administered 2022-11-15 – 2022-11-16 (×3): 5 mg via ORAL
  Filled 2022-11-15 (×3): qty 1

## 2022-11-15 MED ORDER — INSULIN ASPART 100 UNIT/ML IJ SOLN
0.0000 [IU] | Freq: Three times a day (TID) | INTRAMUSCULAR | Status: DC
  Administered 2022-11-15 – 2022-11-16 (×2): 2 [IU] via SUBCUTANEOUS
  Administered 2022-11-16: 1 [IU] via SUBCUTANEOUS

## 2022-11-15 NOTE — Progress Notes (Signed)
*  PRELIMINARY RESULTS* Echocardiogram Limited 2-D Echocardiogram  has been performed.  Stacey Drain 11/15/2022, 2:07 PM

## 2022-11-15 NOTE — Progress Notes (Signed)
Rounding Note    Patient Name: Lisa Thornton Date of Encounter: 11/15/2022  [facility] Cardiologist: Marjo Bicker, MD   Subjective   No complaints  Inpatient Medications    Scheduled Meds:  Chlorhexidine Gluconate Cloth  6 each Topical Daily   metoprolol tartrate  25 mg Oral Q6H   Continuous Infusions:  cefTRIAXone (ROCEPHIN)  IV 1 g (11/15/22 0513)   diltiazem (CARDIZEM) infusion Stopped (11/14/22 1147)   PRN Meds: acetaminophen   Vital Signs    Vitals:   11/15/22 0500 11/15/22 0502 11/15/22 0600 11/15/22 0734  BP: 133/61  (!) 126/53   Pulse: 92  88 88  Resp: 12  18 (!) 27  Temp:  98.1 F (36.7 C)  98 F (36.7 C)  TempSrc:  Oral  Oral  SpO2: 98%  94% 98%  Weight:  67.3 kg    Height:        Intake/Output Summary (Last 24 hours) at 11/15/2022 0840 Last data filed at 11/15/2022 0506 Gross per 24 hour  Intake 1953.21 ml  Output --  Net 1953.21 ml      11/15/2022    5:02 AM 11/14/2022    5:24 AM 11/13/2022    9:00 PM  Last 3 Weights  Weight (lbs) 148 lb 5.9 oz 145 lb 8.1 oz 147 lb 11.3 oz  Weight (kg) 67.3 kg 66 kg 67 kg      Telemetry    Afib 70s to 80s - Personally Reviewed  ECG    N/a - Personally Reviewed  Physical Exam   GEN: No acute distress.   Neck: No JVD Cardiac: irreg Respiratory: Clear to auscultation bilaterally. GI: Soft, nontender, non-distended  MS: No edema; No deformity. Neuro:  Nonfocal  Psych: Normal affect   Labs    High Sensitivity Troponin:   Recent Labs  Lab 11/13/22 1440 11/13/22 1709  TROPONINIHS 15 14     Chemistry Recent Labs  Lab 11/13/22 1440 11/15/22 0458  NA 132* 137  K 3.2* 4.4  CL 98 106  CO2 21* 27  GLUCOSE 192* 108*  BUN 40* 22  CREATININE 1.48* 1.15*  CALCIUM 8.9 8.8*  MG 1.8  --   PROT 7.7  --   ALBUMIN 3.3*  --   AST 69*  --   ALT 25  --   ALKPHOS 55  --   BILITOT 0.7  --   GFRNONAA 38* 52*  ANIONGAP 13 4*    Lipids No results for input(s): "CHOL", "TRIG",  "HDL", "LABVLDL", "LDLCALC", "CHOLHDL" in the last 168 hours.  Hematology Recent Labs  Lab 11/13/22 1440 11/14/22 0728 11/15/22 0458  WBC 10.6* 10.7* 6.9  RBC 3.65* 3.23* 3.31*  HGB 11.2* 9.8* 10.1*  HCT 32.8* 29.2* 30.5*  MCV 89.9 90.4 92.1  MCH 30.7 30.3 30.5  MCHC 34.1 33.6 33.1  RDW 13.1 13.2 13.2  PLT 182 155 153   Thyroid No results for input(s): "TSH", "FREET4" in the last 168 hours.  BNP Recent Labs  Lab 11/13/22 1440  BNP 82.0    DDimer No results for input(s): "DDIMER" in the last 168 hours.   Radiology    DG Chest Port 1 View  Result Date: 11/13/2022 CLINICAL DATA:  Sepsis. EXAM: PORTABLE CHEST 1 VIEW COMPARISON:  X-ray 08/10/2022 and older FINDINGS: Right subclavian chest port with tip along the SVC brachiocephalic junction region, unchanged from prior. Underinflation. No consolidation, pneumothorax or effusion. No edema. Normal cardiopericardial silhouette. Overlapping cardiac leads surgical clips  in the left axillary region. Old rib fractures. IMPRESSION: Underinflation.  No acute cardiopulmonary disease.  Chest port Electronically Signed   By: Karen Kays M.D.   On: 11/13/2022 15:31    Cardiac Studies    Patient Profile     Lisa Thornton is a 67 y.o. female with a hx of PAF, CHF, CM,  who is being seen 11/14/2022 for the evaluation of Afib with RVR at the request of Dr. Flossie Dibble.   Assessment & Plan  1.Afib, probably persistent  - prior PAF, I don't see clearly that she has been in SR since 01/2018 by EKGs in epic.  - current RVR may be secondary to UTI, fever - at home on lopressor 50mg  bid for rate control.  -from ER note mised few days of eliquis   -started on dilt gtt on admission, weaned off. Started lopressor 25mg  tid. BP's are tolerating, rates are well controlled 70s to 80s - transition back to her home lopressor 50mg  bid.   - would consider cardiac monitor at outpatient f/u. Need to see how well afib is controlled in absence of  infection/systemic illness and if any long term management changes are needed.        2. UTI - abx per primary team.      3.HFmrEF - echos have been limited as they have been done in setting of afib with RVR - she needs an echo when rates have been controlled to better determine her EF and long term therapy indications, will obtain a limited echo     For questions or updates, please contact [facility] Please consult [email or web address] for contact info under        Signed, Dina Rich, MD  11/15/2022, 8:40 AM

## 2022-11-15 NOTE — Plan of Care (Signed)

## 2022-11-15 NOTE — Progress Notes (Signed)
PROGRESS NOTE   Lisa Thornton  ZDG:[id number] DOB: December 01, 1955 DOA: 11/13/2022 PCP: Anabel Halon, MD   Chief Complaint  Patient presents with   Near Syncope   Level of care: Telemetry  Brief Admission History:  Lisa Thornton 67 year old female history of breast cancer status post left mastectomy, history of A-fib, cardiomyopathy EF of 45-50%, type 2 diabetes with neuropathy, hypertension, OSA, hyperlipidemia presents to the ER today with a 2-day history of feeling fatigued.  No palpitations.  Patient has missed 2 days worth of Eliquis.  She decided not to take it.  No reason given.  Denies any chest pain.  Shortness of breath.  Came to the ER for evaluation and found to have Afib RVR and sepsis from UTI.    Assessment and Plan:  Atrial fibrillation with rapid ventricular response - exacerbated by UTI - initially treated with IV cardizem gtts then transitioned to oral metoprolol  - Pt was taken off amiodarone by cardiology during April 2024  office visit. Office EKG showed pt was still in afib.   -cardiology team resumed home metoprolol 50 mg BID 8/14 -remains on apixaban 5 mg BID for full anticoagulation  - Echo in 5/ 2024. EJF 45-50%, LV mildly decreased function, LV cavity normal, no LVH - Repleted K and Mg. Keep K >4.0 and Mg >2.0  Sepsis due to UTI  - sepsis physiology resolved after aggressive treatment   UTI  - complete ceftriaxone on 11/16/22   OSA (obstructive sleep apnea) CPAP at night.  Cardiomyopathy  LVEF 45-50% on echo 08-2022. Updated with limited echo No LV dysfunction or LVH, no signs of diastolic failure  HLD (hyperlipidemia) Continue crestor 5 mg  Hypokalemia Replete K. Keep K > 4.0  Type 2 diabetes mellitus with diabetic neuropathy, unspecified Hold metformin, rybelsus. continue SSI. A1c 6.4% 3 mos ago CBG (last 3)  Recent Labs    11/13/22 2114  GLUCAP 113*   Essential hypertension Well-controlled on metoprolol 50 mg BID.  DVT prophylaxis:  apixaban Code Status: Full  Family Communication:  Disposition: anticipate home    Consultants:  Cardiology  inpatient  Procedures:   Antimicrobials:  Ceftriaxone IV 8/12>>   Subjective: Pt denies having CP, SOB and palpitations.   Objective: Vitals:   11/15/22 0600 11/15/22 0734 11/15/22 0908 11/15/22 1000  BP: (!) 126/53  130/72 118/79  Pulse: 88 88 92 93  Resp: 18 (!) 27  14  Temp:  98 F (36.7 C)    TempSrc:  Oral    SpO2: 94% 98%  96%  Weight:      Height:        Intake/Output Summary (Last 24 hours) at 11/15/2022 1022 Last data filed at 11/15/2022 0506 Gross per 24 hour  Intake 1953.21 ml  Output --  Net 1953.21 ml   Filed Weights   11/13/22 2100 11/14/22 0524 11/15/22 0502  Weight: 67 kg 66 kg 67.3 kg   Examination:  General exam: Appears calm and comfortable  Respiratory system: Clear to auscultation. Respiratory effort normal. Cardiovascular system: irregularly irregular, normal S1 & S2 heard. No JVD, murmurs, rubs, gallops or clicks. No pedal edema. Gastrointestinal system: Abdomen is nondistended, soft and nontender. No organomegaly or masses felt. Normal bowel sounds heard. Central nervous system: Alert and oriented. No focal neurological deficits. Extremities: Symmetric 5 x 5 power. Skin: No rashes, lesions or ulcers. Psychiatry: Judgement and insight appear normal. Mood & affect appropriate.   Data Reviewed: I have personally reviewed following labs and  imaging studies  CBC: Recent Labs  Lab 11/13/22 1440 11/14/22 0728 11/15/22 0458  WBC 10.6* 10.7* 6.9  NEUTROABS 7.9* 8.2*  --   HGB 11.2* 9.8* 10.1*  HCT 32.8* 29.2* 30.5*  MCV 89.9 90.4 92.1  PLT 182 155 153    Basic Metabolic Panel: Recent Labs  Lab 11/13/22 1440 11/15/22 0458  NA 132* 137  K 3.2* 4.4  CL 98 106  CO2 21* 27  GLUCOSE 192* 108*  BUN 40* 22  CREATININE 1.48* 1.15*  CALCIUM 8.9 8.8*  MG 1.8  --     CBG: Recent Labs  Lab 11/13/22 2114  GLUCAP 113*     Recent Results (from the past 240 hour(s))  Blood Culture (routine x 2)     Status: None (Preliminary result)   Collection Time: 11/13/22  2:40 PM   Specimen: BLOOD RIGHT ARM  Result Value Ref Range Status   Specimen Description BLOOD RIGHT ARM  Final   Special Requests   Final    BOTTLES DRAWN AEROBIC AND ANAEROBIC Blood Culture adequate volume   Culture   Final    NO GROWTH 2 DAYS Performed at [facility], [address], [place], [place] [address]    Report Status PENDING  Incomplete  Blood Culture (routine x 2)     Status: None (Preliminary result)   Collection Time: 11/13/22  2:55 PM   Specimen: BLOOD RIGHT ARM  Result Value Ref Range Status   Specimen Description BLOOD RIGHT ARM  Final   Special Requests   Final    BOTTLES DRAWN AEROBIC AND ANAEROBIC Blood Culture adequate volume   Culture   Final    NO GROWTH 2 DAYS Performed at [facility], [address] [place], [place] [address]    Report Status PENDING  Incomplete  Urine Culture     Status: Abnormal (Preliminary result)   Collection Time: 11/13/22  8:11 PM   Specimen: Urine, Random  Result Value Ref Range Status   Specimen Description   Final    URINE, RANDOM Performed at [facility], [address], [place], [place] [address]    Special Requests   Final    NONE Reflexed from [id number] Performed at [facility], [address], [place], [place] [address]    Culture (A)  Final    >=100,000 COLONIES/mL GRAM NEGATIVE RODS SUSCEPTIBILITIES TO FOLLOW Performed at [facility], [address] [place], [place] [address]    Report Status PENDING  Incomplete  MRSA Next Gen by PCR, Nasal     Status: None   Collection Time: 11/13/22  8:28 PM   Specimen: Nasal Mucosa; Nasal Swab  Result Value Ref Range Status   MRSA by PCR Next Gen NOT DETECTED NOT DETECTED Final    Comment: (NOTE) The GeneXpert MRSA Assay (FDA approved for NASAL specimens only), is one component of a comprehensive MRSA colonization  surveillance program. It is not intended to diagnose MRSA infection nor to guide or monitor treatment for MRSA infections. Test performance is not FDA approved in patients less than 9 years old. Performed at [facility], [address], [place], [place] [address]      Radiology Studies: [facility] Chest [facility] 1 View  Result Date: 11/13/2022 CLINICAL DATA:  Sepsis. EXAM: PORTABLE CHEST 1 VIEW COMPARISON:  X-ray 08/10/2022 and older FINDINGS: Right subclavian chest port with tip along the SVC brachiocephalic junction region, unchanged from prior. Underinflation. No consolidation, pneumothorax or effusion. No edema. Normal cardiopericardial silhouette. Overlapping  cardiac leads surgical clips in the left axillary region. Old rib fractures. IMPRESSION: Underinflation.  No acute cardiopulmonary disease.  Chest port Electronically Signed   By: Karen Kays M.D.   On: 11/13/2022 15:31    Scheduled Meds:  apixaban  5 mg Oral BID   Chlorhexidine Gluconate Cloth  6 each Topical Daily   metoprolol tartrate  50 mg Oral BID   Continuous Infusions:  cefTRIAXone (ROCEPHIN)  IV 1 g (11/15/22 0513)     LOS: 1 day   Time spent: 38 mins   Laural Benes, MD How to contact the [facility] Attending or Consulting provider 7A - 7P or covering provider during after hours 7P -7A, for this patient?  Check the care team in [facility] and look for a) attending/consulting [facility] provider listed and b) the [facility] team listed Log into [email or web address] and use [place]'s universal password to access. If you do not have the password, please contact the hospital operator. Locate the [facility] provider you are looking for under [facility] and page to a number that you can be directly reached. If you still have difficulty reaching the provider, please page the [facility] (Director on Call) for the Hospitalists listed on amion for assistance.  11/15/2022, 10:22 AM

## 2022-11-16 DIAGNOSIS — I4891 Unspecified atrial fibrillation: Secondary | ICD-10-CM | POA: Diagnosis not present

## 2022-11-16 DIAGNOSIS — B961 Klebsiella pneumoniae [K. pneumoniae] as the cause of diseases classified elsewhere: Secondary | ICD-10-CM

## 2022-11-16 DIAGNOSIS — A4159 Other Gram-negative sepsis: Secondary | ICD-10-CM | POA: Diagnosis not present

## 2022-11-16 DIAGNOSIS — E876 Hypokalemia: Secondary | ICD-10-CM | POA: Diagnosis not present

## 2022-11-16 DIAGNOSIS — I429 Cardiomyopathy, unspecified: Secondary | ICD-10-CM | POA: Diagnosis not present

## 2022-11-16 LAB — HEMOGLOBIN A1C
Hgb A1c MFr Bld: 7.5 % — ABNORMAL HIGH (ref 4.8–5.6)
Mean Plasma Glucose: 169 mg/dL

## 2022-11-16 LAB — MAGNESIUM: Magnesium: 1.5 mg/dL — ABNORMAL LOW (ref 1.7–2.4)

## 2022-11-16 LAB — GLUCOSE, CAPILLARY
Glucose-Capillary: 139 mg/dL — ABNORMAL HIGH (ref 70–99)
Glucose-Capillary: 140 mg/dL — ABNORMAL HIGH (ref 70–99)
Glucose-Capillary: 172 mg/dL — ABNORMAL HIGH (ref 70–99)

## 2022-11-16 LAB — BASIC METABOLIC PANEL
Anion gap: 7 (ref 5–15)
BUN: 19 mg/dL (ref 8–23)
CO2: 24 mmol/L (ref 22–32)
Calcium: 8.8 mg/dL — ABNORMAL LOW (ref 8.9–10.3)
Chloride: 106 mmol/L (ref 98–111)
Creatinine, Ser: 0.99 mg/dL (ref 0.44–1.00)
GFR, Estimated: >60 mL/min (ref >60)
Glucose, Bld: 165 mg/dL — ABNORMAL HIGH (ref 70–99)
Potassium: 3.5 mmol/L (ref 3.5–5.1)
Sodium: 137 mmol/L (ref 135–145)

## 2022-11-16 LAB — CBC
HCT: 30.9 % — ABNORMAL LOW (ref 36.0–46.0)
Hemoglobin: 10.3 g/dL — ABNORMAL LOW (ref 12.0–15.0)
MCH: 30.5 pg (ref 26.0–34.0)
MCHC: 33.3 g/dL (ref 30.0–36.0)
MCV: 91.4 fL (ref 80.0–100.0)
Platelets: 162 10*3/uL (ref 150–400)
RBC: 3.38 MIL/uL — ABNORMAL LOW (ref 3.87–5.11)
RDW: 12.8 % (ref 11.5–15.5)
WBC: 6.2 10*3/uL (ref 4.0–10.5)
nRBC: 0 % (ref 0.0–0.2)

## 2022-11-16 MED ORDER — LOSARTAN POTASSIUM 25 MG PO TABS: 12.5000 mg | ORAL_TABLET | Freq: Every day | ORAL | 2 refills | Status: DC

## 2022-11-16 MED ORDER — FUROSEMIDE 40 MG PO TABS: 20.0000 mg | ORAL_TABLET | Freq: Every day | ORAL | Status: DC | PRN

## 2022-11-16 MED ORDER — MAGNESIUM SULFATE 4 GM/100ML IV SOLN
4.0000 g | Freq: Once | INTRAVENOUS | Status: AC
  Administered 2022-11-16: 4 g via INTRAVENOUS
  Filled 2022-11-16: qty 100

## 2022-11-16 MED ORDER — EMPAGLIFLOZIN 10 MG PO TABS: 10.0000 mg | ORAL_TABLET | Freq: Every day | ORAL | 2 refills | Status: DC

## 2022-11-16 MED ORDER — METOPROLOL SUCCINATE ER 50 MG PO TB24: 150.0000 mg | ORAL_TABLET | Freq: Every day | ORAL | Status: DC

## 2022-11-16 MED ORDER — POTASSIUM CHLORIDE CRYS ER 20 MEQ PO TBCR
40.0000 meq | EXTENDED_RELEASE_TABLET | Freq: Once | ORAL | Status: AC
  Administered 2022-11-16: 40 meq via ORAL
  Filled 2022-11-16: qty 2

## 2022-11-16 MED ORDER — CEFDINIR 300 MG PO CAPS: 300.0000 mg | ORAL_CAPSULE | Freq: Two times a day (BID) | ORAL | 0 refills | Status: AC

## 2022-11-16 MED ORDER — LOSARTAN POTASSIUM 25 MG PO TABS
12.5000 mg | ORAL_TABLET | Freq: Every day | ORAL | Status: DC
  Administered 2022-11-16: 12.5 mg via ORAL
  Filled 2022-11-16: qty 1

## 2022-11-16 MED ORDER — EMPAGLIFLOZIN 10 MG PO TABS
10.0000 mg | ORAL_TABLET | Freq: Every day | ORAL | Status: DC
  Administered 2022-11-16: 10 mg via ORAL
  Filled 2022-11-16 (×2): qty 1

## 2022-11-16 MED ORDER — METOPROLOL SUCCINATE ER 50 MG PO TB24: 150.0000 mg | ORAL_TABLET | Freq: Every day | ORAL | 2 refills | Status: DC

## 2022-11-16 NOTE — Discharge Summary (Signed)
Physician Discharge Summary  Lisa Thornton:[id number] DOB: 03/07/1956 DOA: 11/13/2022  PCP: Anabel Halon, MD Cardiology: [facility] Admit date: 11/13/2022 Discharge date: 11/16/2022  Admitted From:  Home  Disposition: Home   Recommendations for Outpatient Follow-up:  Follow up with PCP in 1 weeks Follow up with cardiology on 11/28/22 as scheduled Please obtain BMP/CBC in 1-2 weeks to follow up with med changes Please repeat Echocardiogram in 3-6 months to follow up   Discharge Condition: STABLE   CODE STATUS: FULL DIET: Heart Healthy/Carb Modified    Brief Hospitalization Summary: Please see all hospital notes, images, labs for full details of the hospitalization. ADMISSION PROVIDER HPI:  67 year old female history of breast cancer status post left mastectomy, history of A-fib, cardiomyopathy EF of 45-50%, type 2 diabetes with neuropathy, hypertension, OSA, hyperlipidemia presents to the ER today with a 2-day history of feeling fatigued.  No palpitations.  Patient has missed 2 days worth of Eliquis.  She decided not to take it.  No reason given.  Denies any chest pain.  Shortness of breath.  Came to the ER for evaluation and found to have Afib RVR and sepsis from UTI.     Hospital course by problem list  Atrial fibrillation with rapid ventricular response - exacerbated by Sepsis, bacteremia and UTI - initially treated with IV cardizem gtts then transitioned to oral metoprolol  - Pt was taken off amiodarone by cardiology during April 2024 office visit. Office EKG showed pt was still in afib.   -cardiology team resumed home metoprolol 50 mg BID 8/14 -cardiology team adjusted metoprolol to toprol XL 150 mg daily starting 11/17/22 -remains on apixaban 5 mg BID for full anticoagulation  - Echo in 5/ 2024. LVEF 45-50%, LV mildly decreased function, LV cavity normal, no LVH - Repleted K and Mg. Goals are to Keep K >4.0 and Mg >2.0   Sepsis due to Klebsiella UTI  Klebsiella  pneumonia Bacteremia - sepsis physiology resolved after aggressive treatment  - treated with IV ceftriaxone thru 11/16/22 - DC home on oral cefdinir to complete full course of therapy   Klebsiella UTI  - treated with IV ceftriaxone thru 11/16/22    OSA (obstructive sleep apnea) CPAP at night.   Cardiomyopathy  LVEF 45-50% on echo 08-2022. Updated with limited echo No LV dysfunction or LVH, no signs of diastolic failure Repeat ECHO in 3-6 months per Dr. Wyline Mood to follow up    HLD (hyperlipidemia) Resume home treatment   Hypokalemia Repleted K. Keep K > 4.0   Hypomagnesemia - IV Mg 4 gm given prior to discharge 8/15.   Uncontrolled Type 2 diabetes mellitus with diabetic neuropathy, unspecified Resume home metformin, rybelsus.  A1c 7.5 %  Cardiology service started patient on Jardiance 10 mg daily on 11/16/22.  CBG (last 3)  Recent Labs    11/16/22 0253 11/16/22 0714 11/16/22 1115  GLUCAP 139* 140* 172*    Essential hypertension Well-controlled on metoprolol DC amlodipine per cardiology Started on toprol XL 150 mg daily  Jardiance 10 mg daily Losartan 12.5 mg daily    Discharge Diagnoses:  Principal Problem:   Atrial fibrillation with rapid ventricular response (HCC) Active Problems:   Essential hypertension   Type 2 diabetes mellitus with diabetic neuropathy, unspecified (HCC)   Hypokalemia   HLD (hyperlipidemia)   Cardiomyopathy (HCC)   OSA (obstructive sleep apnea)   SIRS (systemic inflammatory response syndrome) (HCC)   Atrial fibrillation with RVR (HCC)   Generalized weakness   Discharge Instructions:  Allergies as of 11/16/2022   No Known Allergies      Medication List     STOP taking these medications    amLODipine 5 MG tablet Commonly known as: NORVASC   Fish Oil 1000 MG Caps   metoprolol tartrate 50 MG tablet Commonly known as: LOPRESSOR       TAKE these medications    Accu-Chek Guide test strip Generic drug: glucose blood USE 1  STRIP TO CHECK GLUCOSE THREE TIMES DAILY IN THE MORNING, AT NOON, AND AT BEDTIME AS DIRECTED   Accu-Chek Softclix Lancets lancets USE 1  TO CHECK GLUCOSE UP TO 4 TIMES DAILY   anastrozole 1 MG tablet Commonly known as: ARIMIDEX Take 1 tablet (1 mg total) by mouth daily.   apixaban 5 MG Tabs tablet Commonly known as: Eliquis Take 1 tablet (5 mg total) by mouth 2 (two) times daily.   atorvastatin 10 MG tablet Commonly known as: LIPITOR Take 1 tablet (10 mg total) by mouth at bedtime.   blood glucose meter kit and supplies Dispense based on patient and insurance preference. Use up to four times daily as directed. (FOR ICD-10 E10.9, E11.9).   calcium carbonate 600 MG Tabs tablet Commonly known as: OS-CAL Take 600 mg by mouth daily with breakfast.   cefdinir 300 MG capsule Commonly known as: OMNICEF Take 1 capsule (300 mg total) by mouth 2 (two) times daily for 7 days. Start taking on: November 17, 2022   cholecalciferol 25 MCG (1000 UNIT) tablet Commonly known as: VITAMIN D3 Take 1,000 Units by mouth daily.   empagliflozin 10 MG Tabs tablet Commonly known as: JARDIANCE Take 1 tablet (10 mg total) by mouth daily. Start taking on: November 17, 2022   furosemide 40 MG tablet Commonly known as: LASIX Take 0.5 tablets (20 mg total) by mouth daily as needed for fluid or edema. What changed:  when to take this reasons to take this   losartan 25 MG tablet Commonly known as: COZAAR Take 0.5 tablets (12.5 mg total) by mouth daily. Start taking on: November 17, 2022   magnesium oxide 400 (240 Mg) MG tablet Commonly known as: MAG-OX Take 1 tablet (400 mg total) by mouth daily.   metFORMIN 1000 MG tablet Commonly known as: GLUCOPHAGE Take 1 tablet (1,000 mg total) by mouth daily with breakfast.   metoprolol succinate 50 MG 24 hr tablet Commonly known as: TOPROL-XL Take 3 tablets (150 mg total) by mouth daily. Take with or immediately following a meal. Start taking on: November 17, 2022   Rybelsus 7 MG Tabs Generic drug: Semaglutide Take 1 tablet (7 mg total) by mouth daily.   UNABLE TO FIND 1 each by Does not apply route daily. Med Name: Shower Chair   UNABLE TO FIND 1 each by Does not apply route daily. Med Name: Bed side Commode to fix over toilet seat        Follow-up Information     Ellsworth Lennox, PA-C Follow up on 11/28/2022.   Specialties: Cardiology, Cardiology Why: Cardiology Hospital Follow-up on 11/28/2022 at 3:00 PM. Contact information: [address] [place] [address] [phone]         Anabel Halon, MD. Schedule an appointment as soon as possible for a visit in 1 week(s).   Specialty: Internal Medicine Why: Hospital Follow Up Contact information: [address] [place] [address] [phone]                No Known Allergies  Allergies as of 11/16/2022   No Known Allergies      Medication List     STOP taking these medications    amLODipine 5 MG tablet Commonly known as: NORVASC   Fish Oil 1000 MG Caps   metoprolol tartrate 50 MG tablet Commonly known as: LOPRESSOR       TAKE these medications    Accu-Chek Guide test strip Generic drug: glucose blood USE 1 STRIP TO CHECK GLUCOSE THREE TIMES DAILY IN THE MORNING, AT NOON, AND AT BEDTIME AS DIRECTED   Accu-Chek Softclix Lancets lancets USE 1  TO CHECK GLUCOSE UP TO 4 TIMES DAILY   anastrozole 1 MG tablet Commonly known as: ARIMIDEX Take 1 tablet (1 mg total) by mouth daily.   apixaban 5 MG Tabs tablet Commonly known as: Eliquis Take 1 tablet (5 mg total) by mouth 2 (two) times daily.   atorvastatin 10 MG tablet Commonly known as: LIPITOR Take 1 tablet (10 mg total) by mouth at bedtime.   blood glucose meter kit and supplies Dispense based on patient and insurance preference. Use up to four times daily as directed. (FOR ICD-10 E10.9, E11.9).   calcium carbonate 600 MG Tabs tablet Commonly known as: OS-CAL Take 600 mg by mouth  daily with breakfast.   cefdinir 300 MG capsule Commonly known as: OMNICEF Take 1 capsule (300 mg total) by mouth 2 (two) times daily for 7 days. Start taking on: November 17, 2022   cholecalciferol 25 MCG (1000 UNIT) tablet Commonly known as: VITAMIN D3 Take 1,000 Units by mouth daily.   empagliflozin 10 MG Tabs tablet Commonly known as: JARDIANCE Take 1 tablet (10 mg total) by mouth daily. Start taking on: November 17, 2022   furosemide 40 MG tablet Commonly known as: LASIX Take 0.5 tablets (20 mg total) by mouth daily as needed for fluid or edema. What changed:  when to take this reasons to take this   losartan 25 MG tablet Commonly known as: COZAAR Take 0.5 tablets (12.5 mg total) by mouth daily. Start taking on: November 17, 2022   magnesium oxide 400 (240 Mg) MG tablet Commonly known as: MAG-OX Take 1 tablet (400 mg total) by mouth daily.   metFORMIN 1000 MG tablet Commonly known as: GLUCOPHAGE Take 1 tablet (1,000 mg total) by mouth daily with breakfast.   metoprolol succinate 50 MG 24 hr tablet Commonly known as: TOPROL-XL Take 3 tablets (150 mg total) by mouth daily. Take with or immediately following a meal. Start taking on: November 17, 2022   Rybelsus 7 MG Tabs Generic drug: Semaglutide Take 1 tablet (7 mg total) by mouth daily.   UNABLE TO FIND 1 each by Does not apply route daily. Med Name: Shower Chair   UNABLE TO FIND 1 each by Does not apply route daily. Med Name: Bed side Commode to fix over toilet seat        Procedures/Studies: ECHOCARDIOGRAM LIMITED  Result Date: 11/15/2022    ECHOCARDIOGRAM LIMITED REPORT   Patient Name:   NAARA HAMMEN Date of Exam: 11/15/2022 Medical Rec #:  [id number]    Height:       64.0 in Accession #:    [id number]   Weight:       148.4 lb Date of Birth:  1955/07/14   BSA:          1.723 m Patient Age:    67 years     BP:  123/62 mmHg Patient Gender: F            HR:           92 bpm. [facility]  Procedure: Limited Echo Indications:    Congestive Heart Failure I50.9  History:        Patient has prior history of Echocardiogram examinations, most                 recent 08/30/2022. Cardiomyopathy, Arrythmias:Atrial                 Fibrillation; Risk Factors:Hypertension, Diabetes and                 Dyslipidemia. Hx of Breast cancer of upper-outer quadrant of                 left female breast, S/P mastectomy, left.  Sonographer:    Celesta Gentile RCS Referring Phys: [id number] Dorothe Pea BRANCH IMPRESSIONS  1. Limited Echo to evaluate LVEF.  2. Left ventricular ejection fraction, by estimation, is 45%. The left ventricle has mildly decreased function. The left ventricle has no regional wall motion abnormalities. Left ventricular diastolic function could not be evaluated.  3. Right ventricular systolic function is normal. The right ventricular size is normal. Tricuspid regurgitation signal is inadequate for assessing PA pressure.  4. The inferior vena cava is normal in size with greater than 50% respiratory variability, suggesting right atrial pressure of 3 mmHg. Comparison(s): No significant change from prior study. FINDINGS  Left Ventricle: Left ventricular ejection fraction, by estimation, is 45%. The left ventricle has mildly decreased function. The left ventricle has no regional wall motion abnormalities. The left ventricular internal cavity size was normal in size. There is no left ventricular hypertrophy. Left ventricular diastolic function could not be evaluated. Right Ventricle: The right ventricular size is normal. No increase in right ventricular wall thickness. Right ventricular systolic function is normal. Tricuspid regurgitation signal is inadequate for assessing PA pressure. Left Atrium: Left atrial size was not assessed. Right Atrium: Right atrial size was not assessed. Pericardium: There is no evidence of pericardial effusion. Mitral Valve: The mitral valve is normal in structure. Tricuspid Valve:  The tricuspid valve is normal in structure. Aortic Valve: The aortic valve was not assessed. Pulmonic Valve: The pulmonic valve was not assessed. Aorta: The aortic root is normal in size and structure. Venous: The inferior vena cava is normal in size with greater than 50% respiratory variability, suggesting right atrial pressure of 3 mmHg. IAS/Shunts: No atrial level shunt detected by color flow Doppler. LEFT VENTRICLE PLAX 2D LVIDd:         4.30 cm LVIDs:         2.80 cm LV PW:         1.00 cm LV IVS:        1.00 cm LVOT diam:     2.00 cm LVOT Area:     3.14 cm  LV Volumes (MOD) LV vol d, MOD A2C: 71.7 ml LV vol d, MOD A4C: 71.7 ml LV vol s, MOD A2C: 36.8 ml LV vol s, MOD A4C: 35.4 ml LV SV MOD A2C:     34.9 ml LV SV MOD A4C:     71.7 ml LV SV MOD BP:      36.8 ml LEFT ATRIUM         Index LA diam:    3.50 cm 2.03 cm/m   AORTA Ao Root diam: 2.80 cm  SHUNTS Systemic Diam: 2.00 cm Vishnu Priya Mallipeddi Electronically signed by Winfield Rast Mallipeddi Signature Date/Time: 11/15/2022/3:00:18 PM    Final    DG Chest Port 1 View  Result Date: 11/13/2022 CLINICAL DATA:  Sepsis. EXAM: PORTABLE CHEST 1 VIEW COMPARISON:  X-ray 08/10/2022 and older FINDINGS: Right subclavian chest port with tip along the SVC brachiocephalic junction region, unchanged from prior. Underinflation. No consolidation, pneumothorax or effusion. No edema. Normal cardiopericardial silhouette. Overlapping cardiac leads surgical clips in the left axillary region. Old rib fractures. IMPRESSION: Underinflation.  No acute cardiopulmonary disease.  Chest port Electronically Signed   By: Karen Kays M.D.   On: 11/13/2022 15:31     Subjective: Pt says she is eager to go home today.  She feels well and no complaints.   Discharge Exam: Vitals:   11/16/22 0432 11/16/22 0753  BP: 112/62 136/78  Pulse: 91 92  Resp: 19   Temp: 98.1 F (36.7 C)   SpO2: 96%    Vitals:   11/15/22 2149 11/16/22 0011 11/16/22 0432 11/16/22 0753  BP: 114/68 (!)  120/58 112/62 136/78  Pulse: 93 70 91 92  Resp: 20 19 19    Temp: 98.4 F (36.9 C) 98 F (36.7 C) 98.1 F (36.7 C)   TempSrc: Oral     SpO2: 99% 99% 96%   Weight:      Height:       General exam: Appears calm and comfortable  Respiratory system: Clear to auscultation. Respiratory effort normal. Cardiovascular system: irregularly irregular, normal S1 & S2 heard. No JVD, murmurs, rubs, gallops or clicks. No pedal edema. Gastrointestinal system: Abdomen is nondistended, soft and nontender. No organomegaly or masses felt. Normal bowel sounds heard. Central nervous system: Alert and oriented. No focal neurological deficits. Extremities: Symmetric 5 x 5 power. Skin: No rashes, lesions or ulcers. Psychiatry: Judgement and insight appear normal. Mood & affect appropriate   The results of significant diagnostics from this hospitalization (including imaging, microbiology, ancillary and laboratory) are listed below for reference.     Microbiology: Recent Results (from the past 240 hour(s))  Blood Culture (routine x 2)     Status: None (Preliminary result)   Collection Time: 11/13/22  2:40 PM   Specimen: BLOOD RIGHT ARM  Result Value Ref Range Status   Specimen Description BLOOD RIGHT ARM  Final   Special Requests   Final    BOTTLES DRAWN AEROBIC AND ANAEROBIC Blood Culture adequate volume   Culture   Final    NO GROWTH 3 DAYS Performed at [facility], [address], [place], [place] [address]    Report Status PENDING  Incomplete  Blood Culture (routine x 2)     Status: None (Preliminary result)   Collection Time: 11/13/22  2:55 PM   Specimen: BLOOD RIGHT ARM  Result Value Ref Range Status   Specimen Description BLOOD RIGHT ARM  Final   Special Requests   Final    BOTTLES DRAWN AEROBIC AND ANAEROBIC Blood Culture adequate volume   Culture   Final    NO GROWTH 3 DAYS Performed at [facility], [address] [place], [place] [address]    Report Status PENDING  Incomplete  Urine  Culture     Status: Abnormal (Preliminary result)   Collection Time: 11/13/22  8:11 PM   Specimen: Urine, Random  Result Value Ref Range Status   Specimen Description   Final    URINE, RANDOM Performed at [facility], [address], [place], [place] [address]  Special Requests   Final    NONE Reflexed from [id number] Performed at [facility], [address], [place], [place] [address]    Culture (A)  Final    >=100,000 COLONIES/mL KLEBSIELLA PNEUMONIAE CULTURE REINCUBATED FOR BETTER GROWTH SUSCEPTIBILITIES TO FOLLOW Performed at [facility], [address], [place], [place] [address]    Report Status PENDING  Incomplete   Organism ID, Bacteria KLEBSIELLA PNEUMONIAE (A)  Final      Susceptibility   Klebsiella pneumoniae - MIC*    AMPICILLIN >=32 RESISTANT Resistant     CEFAZOLIN <=4 SENSITIVE Sensitive     CEFEPIME <=0.12 SENSITIVE Sensitive     CEFTRIAXONE <=0.25 SENSITIVE Sensitive     CIPROFLOXACIN <=0.25 SENSITIVE Sensitive     GENTAMICIN <=1 SENSITIVE Sensitive     IMIPENEM <=0.25 SENSITIVE Sensitive     NITROFURANTOIN 32 SENSITIVE Sensitive     TRIMETH/SULFA <=20 SENSITIVE Sensitive     AMPICILLIN/SULBACTAM <=2 SENSITIVE Sensitive     PIP/TAZO <=4 SENSITIVE Sensitive     * >=100,000 COLONIES/mL KLEBSIELLA PNEUMONIAE  MRSA Next Gen by PCR, Nasal     Status: None   Collection Time: 11/13/22  8:28 PM   Specimen: Nasal Mucosa; Nasal Swab  Result Value Ref Range Status   MRSA by PCR Next Gen NOT DETECTED NOT DETECTED Final    Comment: (NOTE) The GeneXpert MRSA Assay (FDA approved for NASAL specimens only), is one component of a comprehensive MRSA colonization surveillance program. It is not intended to diagnose MRSA infection nor to guide or monitor treatment for MRSA infections. Test performance is not FDA approved in patients less than 21 years old. Performed at [facility], [address] [place], [place] [address]      Labs: BNP (last 3 results) Recent  Labs    11/13/22 1440  BNP 82.0   Basic Metabolic Panel: Recent Labs  Lab 11/13/22 1440 11/15/22 0458 11/16/22 0500  NA 132* 137 137  K 3.2* 4.4 3.5  CL 98 106 106  CO2 21* 27 24  GLUCOSE 192* 108* 165*  BUN 40* 22 19  CREATININE 1.48* 1.15* 0.99  CALCIUM 8.9 8.8* 8.8*  MG 1.8  --  1.5*   Liver Function Tests: Recent Labs  Lab 11/13/22 1440  AST 69*  ALT 25  ALKPHOS 55  BILITOT 0.7  PROT 7.7  ALBUMIN 3.3*   No results for input(s): "LIPASE", "AMYLASE" in the last 168 hours. No results for input(s): "AMMONIA" in the last 168 hours. CBC: Recent Labs  Lab 11/13/22 1440 11/14/22 0728 11/15/22 0458 11/16/22 0500  WBC 10.6* 10.7* 6.9 6.2  NEUTROABS 7.9* 8.2*  --   --   HGB 11.2* 9.8* 10.1* 10.3*  HCT 32.8* 29.2* 30.5* 30.9*  MCV 89.9 90.4 92.1 91.4  PLT 182 155 153 162   Cardiac Enzymes: No results for input(s): "CKTOTAL", "CKMB", "CKMBINDEX", "TROPONINI" in the last 168 hours. BNP: Invalid input(s): "POCBNP" CBG: Recent Labs  Lab 11/15/22 1638 11/15/22 2144 11/16/22 0253 11/16/22 0714 11/16/22 1115  GLUCAP 109* 120* 139* 140* 172*   D-Dimer No results for input(s): "DDIMER" in the last 72 hours. Hgb A1c Recent Labs    11/15/22 1052  HGBA1C 7.5*   Lipid Profile No results for input(s): "CHOL", "HDL", "LDLCALC", "TRIG", "CHOLHDL", "LDLDIRECT" in the last 72 hours. Thyroid function studies No results for input(s): "TSH", "T4TOTAL", "T3FREE", "THYROIDAB" in the last 72 hours.  Invalid input(s): "FREET3" Anemia work up No results for input(s): "VITAMINB12", "FOLATE", "  FERRITIN", "TIBC", "IRON", "RETICCTPCT" in the last 72 hours. Urinalysis    Component Value Date/Time   COLORURINE YELLOW 11/13/2022 2011   APPEARANCEUR HAZY (A) 11/13/2022 2011   APPEARANCEUR Turbid (A) 04/26/2022 1413   LABSPEC 1.020 11/13/2022 2011   PHURINE 5.0 11/13/2022 2011   GLUCOSEU NEGATIVE 11/13/2022 2011   HGBUR MODERATE (A) 11/13/2022 2011   BILIRUBINUR NEGATIVE  11/13/2022 2011   BILIRUBINUR CANCELED 04/26/2022 1413   KETONESUR NEGATIVE 11/13/2022 2011   PROTEINUR 30 (A) 11/13/2022 2011   UROBILINOGEN 0.2 11/14/2021 1443   NITRITE POSITIVE (A) 11/13/2022 2011   LEUKOCYTESUR MODERATE (A) 11/13/2022 2011   Sepsis Labs Recent Labs  Lab 11/13/22 1440 11/14/22 0728 11/15/22 0458 11/16/22 0500  WBC 10.6* 10.7* 6.9 6.2   Microbiology Recent Results (from the past 240 hour(s))  Blood Culture (routine x 2)     Status: None (Preliminary result)   Collection Time: 11/13/22  2:40 PM   Specimen: BLOOD RIGHT ARM  Result Value Ref Range Status   Specimen Description BLOOD RIGHT ARM  Final   Special Requests   Final    BOTTLES DRAWN AEROBIC AND ANAEROBIC Blood Culture adequate volume   Culture   Final    NO GROWTH 3 DAYS Performed at [facility], [address], [place], [place] [address]    Report Status PENDING  Incomplete  Blood Culture (routine x 2)     Status: None (Preliminary result)   Collection Time: 11/13/22  2:55 PM   Specimen: BLOOD RIGHT ARM  Result Value Ref Range Status   Specimen Description BLOOD RIGHT ARM  Final   Special Requests   Final    BOTTLES DRAWN AEROBIC AND ANAEROBIC Blood Culture adequate volume   Culture   Final    NO GROWTH 3 DAYS Performed at [facility], [address], [place], [place] [address]    Report Status PENDING  Incomplete  Urine Culture     Status: Abnormal (Preliminary result)   Collection Time: 11/13/22  8:11 PM   Specimen: Urine, Random  Result Value Ref Range Status   Specimen Description   Final    URINE, RANDOM Performed at [facility], [address] [place], [place] [address]    Special Requests   Final    NONE Reflexed from [id number] Performed at [facility], [address] [place], [place] [address]    Culture (A)  Final    >=100,000 COLONIES/mL KLEBSIELLA PNEUMONIAE CULTURE REINCUBATED FOR BETTER GROWTH SUSCEPTIBILITIES TO FOLLOW Performed at [facility], [address] [place], [place] [address]    Report Status PENDING  Incomplete   Organism ID, Bacteria KLEBSIELLA PNEUMONIAE (A)  Final      Susceptibility   Klebsiella pneumoniae - MIC*    AMPICILLIN >=32 RESISTANT Resistant     CEFAZOLIN <=4 SENSITIVE Sensitive     CEFEPIME <=0.12 SENSITIVE Sensitive     CEFTRIAXONE <=0.25 SENSITIVE Sensitive     CIPROFLOXACIN <=0.25 SENSITIVE Sensitive     GENTAMICIN <=1 SENSITIVE Sensitive     IMIPENEM <=0.25 SENSITIVE Sensitive     NITROFURANTOIN 32 SENSITIVE Sensitive     TRIMETH/SULFA <=20 SENSITIVE Sensitive     AMPICILLIN/SULBACTAM <=2 SENSITIVE Sensitive     PIP/TAZO <=4 SENSITIVE Sensitive     * >=100,000 COLONIES/mL KLEBSIELLA PNEUMONIAE  MRSA Next Gen by PCR, Nasal     Status: None   Collection Time: 11/13/22  8:28 PM   Specimen: Nasal Mucosa; Nasal Swab  Result Value Ref  Range Status   MRSA by PCR Next Gen NOT DETECTED NOT DETECTED Final    Comment: (NOTE) The GeneXpert MRSA Assay (FDA approved for NASAL specimens only), is one component of a comprehensive MRSA colonization surveillance program. It is not intended to diagnose MRSA infection nor to guide or monitor treatment for MRSA infections. Test performance is not FDA approved in patients less than 24 years old. Performed at [facility], [address], [place], [place] [address]    Time coordinating discharge: 50 mins  SIGNED:  Standley Dakins, MD  [facility] 11/16/2022, 11:45 AM How to contact the [facility] Attending or Consulting provider 7A - 7P or covering provider during after hours 7P -7A, for this patient?  Check the care team in [facility] and look for a) attending/consulting [facility] provider listed and b) the [facility] team listed Log into [email or web address] and use [place]'s universal password to access. If you do not have the password, please contact the hospital operator. Locate the [facility] provider you are looking for under [facility] and page to a number that you can be directly  reached. If you still have difficulty reaching the provider, please page the [facility] (Director on Call) for the Hospitalists listed on amion for assistance.

## 2022-11-16 NOTE — Progress Notes (Signed)
Rounding Note    Patient Name: Lisa Thornton Date of Encounter: 11/16/2022  [facility] Cardiologist: Marjo Bicker, MD   Subjective   No complaints  Inpatient Medications    Scheduled Meds:  apixaban  5 mg Oral BID   Chlorhexidine Gluconate Cloth  6 each Topical Daily   insulin aspart  0-9 Units Subcutaneous TID WC   metoprolol tartrate  50 mg Oral BID   Continuous Infusions:  PRN Meds: acetaminophen   Vital Signs    Vitals:   11/15/22 2149 11/16/22 0011 11/16/22 0432 11/16/22 0753  BP: 114/68 (!) 120/58 112/62 136/78  Pulse: 93 70 91 92  Resp: 20 19 19    Temp: 98.4 F (36.9 C) 98 F (36.7 C) 98.1 F (36.7 C)   TempSrc: Oral     SpO2: 99% 99% 96%   Weight:      Height:        Intake/Output Summary (Last 24 hours) at 11/16/2022 0848 Last data filed at 11/15/2022 1828 Gross per 24 hour  Intake 240 ml  Output --  Net 240 ml      11/15/2022    5:02 AM 11/14/2022    5:24 AM 11/13/2022    9:00 PM  Last 3 Weights  Weight (lbs) 148 lb 5.9 oz 145 lb 8.1 oz 147 lb 11.3 oz  Weight (kg) 67.3 kg 66 kg 67 kg      Telemetry    Afib primarily rate controlled, occasional high rates - Personally Reviewed  ECG    N/a - Personally Reviewed  Physical Exam   GEN: No acute distress.   Neck: No JVD Cardiac: irreg Respiratory: Clear to auscultation bilaterally. GI: Soft, nontender, non-distended  MS: No edema; No deformity. Neuro:  Nonfocal  Psych: Normal affect   Labs    High Sensitivity Troponin:   Recent Labs  Lab 11/13/22 1440 11/13/22 1709  TROPONINIHS 15 14     Chemistry Recent Labs  Lab 11/13/22 1440 11/15/22 0458 11/16/22 0500  NA 132* 137 137  K 3.2* 4.4 3.5  CL 98 106 106  CO2 21* 27 24  GLUCOSE 192* 108* 165*  BUN 40* 22 19  CREATININE 1.48* 1.15* 0.99  CALCIUM 8.9 8.8* 8.8*  MG 1.8  --  1.5*  PROT 7.7  --   --   ALBUMIN 3.3*  --   --   AST 69*  --   --   ALT 25  --   --   ALKPHOS 55  --   --   BILITOT 0.7  --    --   GFRNONAA 38* 52* >60  ANIONGAP 13 4* 7    Lipids No results for input(s): "CHOL", "TRIG", "HDL", "LABVLDL", "LDLCALC", "CHOLHDL" in the last 168 hours.  Hematology Recent Labs  Lab 11/14/22 0728 11/15/22 0458 11/16/22 0500  WBC 10.7* 6.9 6.2  RBC 3.23* 3.31* 3.38*  HGB 9.8* 10.1* 10.3*  HCT 29.2* 30.5* 30.9*  MCV 90.4 92.1 91.4  MCH 30.3 30.5 30.5  MCHC 33.6 33.1 33.3  RDW 13.2 13.2 12.8  PLT 155 153 162   Thyroid No results for input(s): "TSH", "FREET4" in the last 168 hours.  BNP Recent Labs  Lab 11/13/22 1440  BNP 82.0    DDimer No results for input(s): "DDIMER" in the last 168 hours.   Radiology    ECHOCARDIOGRAM LIMITED  Result Date: 11/15/2022    ECHOCARDIOGRAM LIMITED REPORT   Patient Name:   Lisa Thornton  Date of Exam: 11/15/2022 Medical Rec #:  [id number]    Height:       64.0 in Accession #:    [id number]   Weight:       148.4 lb Date of Birth:  [birth year]   BSA:          1.723 m Patient Age:    67 years     BP:           123/62 mmHg Patient Gender: F            HR:           92 bpm. [facility] Procedure: Limited Echo Indications:    Congestive Heart Failure I50.9  History:        Patient has prior history of Echocardiogram examinations, most                 recent 08/30/2022. Cardiomyopathy, Arrythmias:Atrial                 Fibrillation; Risk Factors:Hypertension, Diabetes and                 Dyslipidemia. Hx of Breast cancer of upper-outer quadrant of                 left female breast, S/P mastectomy, left.  Sonographer:    Celesta Gentile RCS Referring Phys: [id number] Dorothe Pea  IMPRESSIONS  1. Limited Echo to evaluate LVEF.  2. Left ventricular ejection fraction, by estimation, is 45%. The left ventricle has mildly decreased function. The left ventricle has no regional wall motion abnormalities. Left ventricular diastolic function could not be evaluated.  3. Right ventricular systolic function is normal. The right ventricular size is normal.  Tricuspid regurgitation signal is inadequate for assessing PA pressure.  4. The inferior vena cava is normal in size with greater than 50% respiratory variability, suggesting right atrial pressure of 3 mmHg. Comparison(s): No significant change from prior study. FINDINGS  Left Ventricle: Left ventricular ejection fraction, by estimation, is 45%. The left ventricle has mildly decreased function. The left ventricle has no regional wall motion abnormalities. The left ventricular internal cavity size was normal in size. There is no left ventricular hypertrophy. Left ventricular diastolic function could not be evaluated. Right Ventricle: The right ventricular size is normal. No increase in right ventricular wall thickness. Right ventricular systolic function is normal. Tricuspid regurgitation signal is inadequate for assessing PA pressure. Left Atrium: Left atrial size was not assessed. Right Atrium: Right atrial size was not assessed. Pericardium: There is no evidence of pericardial effusion. Mitral Valve: The mitral valve is normal in structure. Tricuspid Valve: The tricuspid valve is normal in structure. Aortic Valve: The aortic valve was not assessed. Pulmonic Valve: The pulmonic valve was not assessed. Aorta: The aortic root is normal in size and structure. Venous: The inferior vena cava is normal in size with greater than 50% respiratory variability, suggesting right atrial pressure of 3 mmHg. IAS/Shunts: No atrial level shunt detected by color flow Doppler. LEFT VENTRICLE PLAX 2D LVIDd:         4.30 cm LVIDs:         2.80 cm LV PW:         1.00 cm LV IVS:        1.00 cm LVOT diam:     2.00 cm LVOT Area:     3.14 cm  LV Volumes (MOD) LV vol d, MOD A2C: 71.7 ml LV vol d,  MOD A4C: 71.7 ml LV vol s, MOD A2C: 36.8 ml LV vol s, MOD A4C: 35.4 ml LV SV MOD A2C:     34.9 ml LV SV MOD A4C:     71.7 ml LV SV MOD BP:      36.8 ml LEFT ATRIUM         Index LA diam:    3.50 cm 2.03 cm/m   AORTA Ao Root diam: 2.80 cm  SHUNTS  Systemic Diam: 2.00 cm Vishnu Priya Mallipeddi Electronically signed by Winfield Rast Mallipeddi Signature Date/Time: 11/15/2022/3:00:18 PM    Final     Cardiac Studies     Assessment & Plan    1.Afib, probably persistent  - prior PAF, I don't see clearly that she has been in SR since 01/2018 by EKGs in epic.  - current RVR may be secondary to UTI, fever - at home on lopressor 50mg  bid for rate control.  -from ER note mised few days of eliquis   -started on dilt gtt on admission, weaned off. Now back on her home lopressor 50mg  bid dosing. Rates primarily controlled, occasional elevated rates. Change lopressor to toprol 150mg  daily at discharge. (Already received lopressor 50mg  this AM, continue lopressor today and start toprol tomorrow)   - would consider cardiac monitor at outpatient f/u. Need to see how well afib is controlled in absence of infection/systemic illness and if any long term management changes are needed especially since mild systolic dysfunction that may be rate related.        2. UTI - abx per primary team.      3.HFmrEF - echos have been limited as they have been done in setting of afib with RVR - echo this admit while rate controlled LVEF 45% -change lopressor to toprol 150mg  daily. SBP 110s and we have titrated her beta blocker, unclear if would tolerate entresto. Start losartan 12.5mg  daily. Consider transition to entesto as outpatient and consider addition of aldactone at f/u. Add jardiance 10mg  daily (note HgbA1c was also 7.5 this admit) - plan for medical therapy, monitor arranged at f/u to clarify afib control. Repeat echo 3-6 months, if persistent dysfunction consider ischemic testing at that time.  -presented hypovolemic with AKI, would have her home lasix just prn swelling  4. HTN - stop her home norvasc, toprol and losartan as listed above   Ok to discharge after Mg and K replacement today. We will arrange f/u, we will sign off inpatient care.   For  questions or updates, please contact [facility] Please consult [email or web address] for contact info under        Signed, Dina Rich, MD  11/16/2022, 8:48 AM

## 2022-11-16 NOTE — Plan of Care (Signed)
  Problem: Education: Goal: Knowledge of General Education information will improve Description: Including pain rating scale, medication(s)/side effects and non-pharmacologic comfort measures Outcome: Not Progressing   Problem: Health Behavior/Discharge Planning: Goal: Ability to manage health-related needs will improve Outcome: Not Progressing   

## 2022-11-16 NOTE — Progress Notes (Signed)
Placed patients home unit at her bedside. Unit is plugged into red outlet. She was not ready to go on but stated she would place herself on it when she was ready.

## 2022-11-16 NOTE — Care Management Important Message (Signed)
Important Message  Patient Details  Name: Lisa Thornton MRN: [id number] Date of Birth: 12/26/1955   Medicare Important Message Given:  N/A - LOS <3 / Initial given by admissions     Corey Harold 11/16/2022, 11:29 AM

## 2022-11-16 NOTE — Discharge Instructions (Signed)
IMPORTANT INFORMATION: PAY CLOSE ATTENTION   PHYSICIAN DISCHARGE INSTRUCTIONS  Follow with Primary care provider  Patel, Rutwik K, MD  and other consultants as instructed by your Hospitalist Physician  SEEK MEDICAL CARE OR RETURN TO EMERGENCY ROOM IF SYMPTOMS COME BACK, WORSEN OR NEW PROBLEM DEVELOPS   Please note: You were cared for by a hospitalist during your hospital stay. Every effort will be made to forward records to your primary care provider.  You can request that your primary care provider send for your hospital records if they have not received them.  Once you are discharged, your primary care physician will handle any further medical issues. Please note that NO REFILLS for any discharge medications will be authorized once you are discharged, as it is imperative that you return to your primary care physician (or establish a relationship with a primary care physician if you do not have one) for your post hospital discharge needs so that they can reassess your need for medications and monitor your lab values.  Please get a complete blood count and chemistry panel checked by your Primary MD at your next visit, and again as instructed by your Primary MD.  Get Medicines reviewed and adjusted: Please take all your medications with you for your next visit with your Primary MD  Laboratory/radiological data: Please request your Primary MD to go over all hospital tests and procedure/radiological results at the follow up, please ask your primary care provider to get all Hospital records sent to his/her office.  In some cases, they will be blood work, cultures and biopsy results pending at the time of your discharge. Please request that your primary care provider follow up on these results.  If you are diabetic, please bring your blood sugar readings with you to your follow up appointment with primary care.    Please call and make your follow up appointments as soon as possible.    Also Note  the following: If you experience worsening of your admission symptoms, develop shortness of breath, life threatening emergency, suicidal or homicidal thoughts you must seek medical attention immediately by calling 911 or calling your MD immediately  if symptoms less severe.  You must read complete instructions/literature along with all the possible adverse reactions/side effects for all the Medicines you take and that have been prescribed to you. Take any new Medicines after you have completely understood and accpet all the possible adverse reactions/side effects.   Do not drive when taking Pain medications or sleeping medications (Benzodiazepines)  Do not take more than prescribed Pain, Sleep and Anxiety Medications. It is not advisable to combine anxiety,sleep and pain medications without talking with your primary care practitioner  Special Instructions: If you have smoked or chewed Tobacco  in the last 2 yrs please stop smoking, stop any regular Alcohol  and or any Recreational drug use.  Wear Seat belts while driving.  Do not drive if taking any narcotic, mind altering or controlled substances or recreational drugs or alcohol.       

## 2022-11-17 ENCOUNTER — Telehealth: Payer: Self-pay

## 2022-11-17 NOTE — Transitions of Care (Post Inpatient/ED Visit) (Signed)
   11/17/2022  Name: Lisa Thornton MRN: [id number] DOB: 12/01/1955  Today's TOC FU Call Status: Today's TOC FU Call Status:: Unsuccessful Call (1st Attempt) Unsuccessful Call (1st Attempt) Date: 11/17/22  Attempted to reach the patient regarding the most recent Inpatient/ED visit.  Follow Up Plan: Additional outreach attempts will be made to reach the patient to complete the Transitions of Care (Post Inpatient/ED visit) call.   Jodelle Gross, RN, BSN, CCM Care Management Coordinator [facility]

## 2022-11-20 ENCOUNTER — Telehealth: Payer: Self-pay

## 2022-11-20 NOTE — Transitions of Care (Post Inpatient/ED Visit) (Signed)
11/20/2022  Name: Lisa Thornton MRN: [id number] DOB: 1955/12/24  Today's TOC FU Call Status: Today's TOC FU Call Status:: Successful TOC FU Call Completed TOC FU Call Complete Date: 11/20/22  Transition Care Management Follow-up Telephone Call Date of Discharge: 11/16/22 [facility] Type of Discharge: Inpatient Admission Primary Inpatient Discharge Diagnosis:: Atrial Fibrillation How have you been since you were released from the hospital?: Better (Patients niece stated her Aunt is doing better) Any questions or concerns?: No  Items Reviewed: Did you receive and understand the discharge instructions provided?: Yes Medications obtained,verified, and reconciled?: Yes (Medications Reviewed) Any new allergies since your discharge?: No Dietary orders reviewed?: Yes Type of Diet Ordered:: Controlled Carbohydrate Do you have support at home?: Yes People in Home: other relative(s) Name of Support/Comfort Primary Source: [name]  Medications Reviewed Today: Medications Reviewed Today     Reviewed by Jodelle Gross, RN (Case Manager) on 11/20/22 at 1415  Med List Status: <None>   Medication Order Taking? Sig Documenting Provider Last Dose Status Informant  Accu-Chek Softclix Lancets lancets [id number] Yes USE 1  TO CHECK GLUCOSE UP TO 4 TIMES DAILY Heather Roberts, NP Taking Active Pharmacy Records, Family Member  anastrozole (ARIMIDEX) 1 MG tablet [id number] Yes Take 1 tablet (1 mg total) by mouth daily. Anabel Halon, MD Taking Active   apixaban (ELIQUIS) 5 MG TABS tablet [id number] Yes Take 1 tablet (5 mg total) by mouth 2 (two) times daily. Anabel Halon, MD Taking Active Pharmacy Records, Family Member           Med Note [place], Laqueta Carina Nov 13, 2022  5:31 PM)    atorvastatin (LIPITOR) 10 MG tablet [id number] Yes Take 1 tablet (10 mg total) by mouth at bedtime. Anabel Halon, MD Taking Active   blood glucose meter kit and supplies [id number] Yes  Dispense based on patient and insurance preference. Use up to four times daily as directed. (FOR ICD-10 E10.9, E11.9). Freddy Finner, NP Taking Active Pharmacy Records, Family Member  calcium carbonate (OS-CAL) 600 MG TABS tablet [id number] Yes Take 600 mg by mouth daily with breakfast. [provider] Taking Active Pharmacy Records, Family Member  cefdinir (OMNICEF) 300 MG capsule [id number] Yes Take 1 capsule (300 mg total) by mouth 2 (two) times daily for 7 days. Johnson, Clanford L, MD Taking Active   cholecalciferol (VITAMIN D3) 25 MCG (1000 UT) tablet [id number] Yes Take 1,000 Units by mouth daily. [provider] Taking Active Pharmacy Records, Family Member  empagliflozin (JARDIANCE) 10 MG TABS tablet [id number] Yes Take 1 tablet (10 mg total) by mouth daily. Cleora Fleet, MD Taking Active   furosemide (LASIX) 40 MG tablet [id number] Yes Take 0.5 tablets (20 mg total) by mouth daily as needed for fluid or edema. Johnson, Clanford L, MD Taking Active   glucose blood (ACCU-CHEK GUIDE) test strip [id number] Yes USE 1 STRIP TO CHECK GLUCOSE THREE TIMES DAILY IN THE MORNING, AT NOON, AND AT BEDTIME AS DIRECTED Anabel Halon, MD Taking Active   losartan (COZAAR) 25 MG tablet [id number] Yes Take 0.5 tablets (12.5 mg total) by mouth daily. Cleora Fleet, MD Taking Active   magnesium oxide (MAG-OX) 400 (240 Mg) MG tablet [id number] Yes Take 1 tablet (400 mg total) by mouth daily. Catarina Hartshorn, MD Taking Active   metFORMIN (GLUCOPHAGE) 1000 MG tablet [id number] Yes Take 1 tablet (1,000 mg total) by mouth daily with breakfast. Anabel Halon, MD Taking Active  metoprolol succinate (TOPROL-XL) 50 MG 24 hr tablet [id number] Yes Take 3 tablets (150 mg total) by mouth daily. Take with or immediately following a meal. Johnson, Clanford L, MD Taking Active   Semaglutide (RYBELSUS) 7 MG TABS [id number] Yes Take 1 tablet (7 mg total) by mouth daily. Anabel Halon, MD Taking Active Pharmacy  Records, Family Member           Med Note [place], Laqueta Carina Nov 13, 2022  5:32 PM)    Ardelia Mems TO FIND [id number]  1 each by Does not apply route daily. Med Name: Shower Chair Anabel Halon, MD  Active   UNABLE TO FIND [id number]  1 each by Does not apply route daily. Med Name: Bed side Commode to fix over toilet seat Anabel Halon, MD  Active             Home Care and Equipment/Supplies: Were Home Health Services Ordered?: No Any new equipment or medical supplies ordered?: No  Functional Questionnaire: Do you need assistance with bathing/showering or dressing?: No Do you need assistance with meal preparation?: No Do you need assistance with eating?: No Do you have difficulty maintaining continence: No Do you need assistance with getting out of bed/getting out of a chair/moving?: No Do you have difficulty managing or taking your medications?: No  Follow up appointments reviewed: PCP Follow-up appointment confirmed?: Yes Date of PCP follow-up appointment?: 11/29/22 Follow-up Provider: Dr. Allena Katz Specialist [facility] Follow-up appointment confirmed?: Yes Date of Specialist follow-up appointment?: 11/28/22 Follow-Up Specialty Provider:: Dr. Iran Ouch Do you need transportation to your follow-up appointment?: No Do you understand care options if your condition(s) worsen?: Yes-patient verbalized understanding  SDOH Interventions Today    Flowsheet Row Most Recent Value  SDOH Interventions   Food Insecurity Interventions Intervention Not Indicated  Housing Interventions Intervention Not Indicated  Transportation Interventions Intervention Not Indicated     Jodelle Gross, RN, BSN, CCM Care Management Coordinator [facility] Phone: [phone]/Fax: [phone]

## 2022-11-20 NOTE — Transitions of Care (Post Inpatient/ED Visit) (Signed)
   11/20/2022  Name: KARMAH JEANGILLES MRN: [id number] DOB: 1955-05-04  Today's TOC FU Call Status: Today's TOC FU Call Status:: Unsuccessful Call (2nd Attempt) Unsuccessful Call (2nd Attempt) Date: 11/20/22  Attempted to reach the patient regarding the most recent Inpatient/ED visit.  Follow Up Plan: Additional outreach attempts will be made to reach the patient to complete the Transitions of Care (Post Inpatient/ED visit) call.   Jodelle Gross, RN, BSN, CCM Care Management Coordinator [place] Phone: [phone]/Fax: [phone]

## 2022-11-27 DIAGNOSIS — G4733 Obstructive sleep apnea (adult) (pediatric): Secondary | ICD-10-CM | POA: Diagnosis not present

## 2022-11-28 ENCOUNTER — Ambulatory Visit: Payer: 59

## 2022-11-28 ENCOUNTER — Encounter: Payer: Self-pay | Admitting: Student

## 2022-11-28 ENCOUNTER — Ambulatory Visit: Payer: 59 | Attending: Student | Admitting: Student

## 2022-11-28 VITALS — BP 116/68 | HR 96 | Ht 64.0 in | Wt 147.0 lb

## 2022-11-28 DIAGNOSIS — G4733 Obstructive sleep apnea (adult) (pediatric): Secondary | ICD-10-CM | POA: Diagnosis not present

## 2022-11-28 DIAGNOSIS — I4811 Longstanding persistent atrial fibrillation: Secondary | ICD-10-CM

## 2022-11-28 DIAGNOSIS — M7989 Other specified soft tissue disorders: Secondary | ICD-10-CM | POA: Diagnosis not present

## 2022-11-28 DIAGNOSIS — I5022 Chronic systolic (congestive) heart failure: Secondary | ICD-10-CM | POA: Diagnosis not present

## 2022-11-28 DIAGNOSIS — Z79899 Other long term (current) drug therapy: Secondary | ICD-10-CM | POA: Diagnosis not present

## 2022-11-28 DIAGNOSIS — I480 Paroxysmal atrial fibrillation: Secondary | ICD-10-CM | POA: Diagnosis not present

## 2022-11-28 DIAGNOSIS — E7849 Other hyperlipidemia: Secondary | ICD-10-CM

## 2022-11-28 MED ORDER — METOPROLOL SUCCINATE ER 50 MG PO TB24: 150.0000 mg | ORAL_TABLET | Freq: Every day | ORAL | 3 refills | Status: DC

## 2022-11-28 MED ORDER — FUROSEMIDE 20 MG PO TABS
20.0000 mg | ORAL_TABLET | Freq: Every day | ORAL | 1 refills | Status: DC | PRN
Start: 2022-11-28 — End: 2023-05-17

## 2022-11-28 MED ORDER — EMPAGLIFLOZIN 10 MG PO TABS: 10.0000 mg | ORAL_TABLET | Freq: Every day | ORAL | 3 refills | Status: DC

## 2022-11-28 MED ORDER — LOSARTAN POTASSIUM 25 MG PO TABS: 12.5000 mg | ORAL_TABLET | Freq: Every day | ORAL | 3 refills | Status: DC

## 2022-11-28 NOTE — Patient Instructions (Signed)
Medication Instructions:   Take Lasix 20mg  daily for the next 3 days then as needed for edema, weight gain > 3 pounds overnight or > 5 pounds in 1 week.   *If you need a refill on your cardiac medications before your next appointment, please call your pharmacy*   Lab Work:  BMET and Magnesium tomorrow.   If you have labs (blood work) drawn today and your tests are completely normal, you will receive your results only by: MyChart Message (if you have MyChart) OR A paper copy in the mail If you have any lab test that is abnormal or we need to change your treatment, we will call you to review the results.   Testing/Procedures:  2 week heart monitor   Follow-Up: At [facility], you and your health needs are our priority.  As part of our continuing mission to provide you with exceptional heart care, we have created designated Provider Care Teams.  These Care Teams include your primary Cardiologist (physician) and Advanced Practice Providers (APPs -  Physician Assistants and Nurse Practitioners) who all work together to provide you with the care you need, when you need it.  We recommend signing up for the patient portal called "MyChart".  Sign up information is provided on this After Visit Summary.  MyChart is used to connect with patients for Virtual Visits (Telemedicine).  Patients are able to view lab/test results, encounter notes, upcoming appointments, etc.  Non-urgent messages can be sent to your provider as well.   To learn more about what you can do with MyChart, go to [email or web address].    Your next appointment:   Keep follow-up on 02/01/2023

## 2022-11-28 NOTE — Progress Notes (Signed)
Cardiology Office Note    Date:  11/28/2022  ID:  Lisa Thornton, Lisa Thornton 11/30/1955, MRN [id number] Cardiologist: Marjo Bicker, MD    History of Present Illness:    Lisa Thornton is a 67 y.o. female with past medical history of persistent atrial fibrillation (hospitalized in 07/2022 with atrial fibrillation with RVR in the setting of sepsis and pneumonia but noted on EKG's dating back to 07/2021), HFmrEF (EF 45% by echo in 07/2022 and 11/2022), OSA and HLD who presents to the office today for hospital follow-up.  She was most recently admitted to [facility] from 8/12 - 11/16/2022 for evaluation of worsening fatigue. Was found to be septic in the setting of Klebsiella UTI and pneumonia. Cardiology was consulted due to atrial fibrillation with RVR which was felt to be secondary to her acute illness. By review of prior EKG's, it was felt that she had likely been in the arrhythmia since 2019. Rate-control was recommended in the setting of her systemic illness with plans for an outpatient monitor to assess her atrial fibrillation rate control/burden.  In talking with the patient and her niece today, she reports overall doing well since her recent hospitalization. Her heart rate was elevated upon returning home but this has improved into the 70's to 80's over the past few weeks. She denies any specific dyspnea on exertion, chest pain, orthopnea or PND. She does experience intermittent lower extremity edema and has a prescription for PRN Lasix but has not yet utilized this. Her niece does prepare her medications on a weekly basis. She has a CPAP at home but has not used this since hospital discharge as she had difficulty plugging it in. They are planning to set this up tonight.   Studies Reviewed:   EKG: EKG is not ordered today.  Limited Echo: 11/2022 IMPRESSIONS     1. Limited Echo to evaluate LVEF.   2. Left ventricular ejection fraction, by estimation, is 45%. The left  ventricle has mildly  decreased function. The left ventricle has no  regional wall motion abnormalities. Left ventricular diastolic function  could not be evaluated.   3. Right ventricular systolic function is normal. The right ventricular  size is normal. Tricuspid regurgitation signal is inadequate for assessing  PA pressure.   4. The inferior vena cava is normal in size with greater than 50%  respiratory variability, suggesting right atrial pressure of 3 mmHg.   Comparison(s): No significant change from prior study.    Risk Assessment/Calculations:    CHA2DS2-VASc Score = 5   This indicates a 7.2% annual risk of stroke. The patient's score is based upon: CHF History: 1 HTN History: 1 Diabetes History: 1 Stroke History: 0 Vascular Disease History: 0 Age Score: 1 Gender Score: 1        STOP-Bang Score:  4        Physical Exam:   VS:  BP 116/68   Pulse 96   Ht 5\' 4"  (1.626 m)   Wt 147 lb (66.7 kg)   SpO2 98%   BMI 25.23 kg/m    Wt Readings from Last 3 Encounters:  11/28/22 147 lb (66.7 kg)  11/15/22 148 lb 5.9 oz (67.3 kg)  09/06/22 143 lb (64.9 kg)     GEN: Well nourished, well developed female appearing in no acute distress NECK: No JVD; No carotid bruits CARDIAC: Irregularly irregular, no murmurs, rubs, gallops RESPIRATORY:  Clear to auscultation without rales, wheezing or rhonchi  ABDOMEN: Appears non-distended. No obvious  abdominal masses. EXTREMITIES: No clubbing or cyanosis. 1+ pitting edema up to mid-shins bilaterally.  Distal pedal pulses are 2+ bilaterally.   Assessment and Plan:   1. Persistent Atrial Fibrillation - Her heart rate is in the 90's during today's visit and has been in the 70's to 80's when checked at home. Will plan for a 2-week event monitor to assess rate control as discussed during her hospitalization. If rates remain poorly controlled, she may benefit from DCCV. However, it appears she has been in atrial fibrillation since at least 07/2021 and did have  moderate LA dilatation by echocardiogram in 07/2022 so rhythm control will likely be challenging. For now, will continue Toprol-XL 150 mg daily for rate control. - No reports of active bleeding. Continue Eliquis 5 mg twice daily for anticoagulation which is the appropriate dose at this time given her age, weight and kidney function.   2. Chronic HFmrEF - Her EF was reduced at 45% by most recent echocardiogram imaging and image quality has been limited in the setting of atrial fibrillation with RVR. She was started on Jardiance 10 mg daily during her recent admission along with Losartan 12.5 mg daily and being continued on Toprol-XL 150 mg daily. Will recheck a BMET and Mg. Pending results, can possibly switch Losartan to Entresto 24-26 mg twice daily. Given that she does have lower extremity edema on examination today, I did recommend that she take Lasix 20 mg for the next 3 days and then as needed for weight gain or worsening edema.  3. HLD - Followed by her PCP. She has been continued on Atorvastatin 10 mg daily. She is due for a repeat FLP with her next set of fasting labs and is anticipating repeat labs later this week.  4. OSA - She has been without her CPAP for the past few weeks but they plan to set this up again tonight. Compliance with CPAP encouraged.   Signed, Ellsworth Lennox, PA-C

## 2022-11-29 ENCOUNTER — Ambulatory Visit (INDEPENDENT_AMBULATORY_CARE_PROVIDER_SITE_OTHER): Payer: 59 | Admitting: Internal Medicine

## 2022-11-29 ENCOUNTER — Encounter: Payer: Self-pay | Admitting: Internal Medicine

## 2022-11-29 VITALS — BP 120/62 | HR 66 | Ht 64.0 in | Wt 143.0 lb

## 2022-11-29 DIAGNOSIS — I429 Cardiomyopathy, unspecified: Secondary | ICD-10-CM

## 2022-11-29 DIAGNOSIS — A419 Sepsis, unspecified organism: Secondary | ICD-10-CM | POA: Diagnosis not present

## 2022-11-29 DIAGNOSIS — D508 Other iron deficiency anemias: Secondary | ICD-10-CM

## 2022-11-29 DIAGNOSIS — I5032 Chronic diastolic (congestive) heart failure: Secondary | ICD-10-CM | POA: Diagnosis not present

## 2022-11-29 DIAGNOSIS — I4891 Unspecified atrial fibrillation: Secondary | ICD-10-CM | POA: Diagnosis not present

## 2022-11-29 DIAGNOSIS — Z7984 Long term (current) use of oral hypoglycemic drugs: Secondary | ICD-10-CM | POA: Diagnosis not present

## 2022-11-29 DIAGNOSIS — N390 Urinary tract infection, site not specified: Secondary | ICD-10-CM

## 2022-11-29 DIAGNOSIS — M7989 Other specified soft tissue disorders: Secondary | ICD-10-CM | POA: Diagnosis not present

## 2022-11-29 DIAGNOSIS — Z79899 Other long term (current) drug therapy: Secondary | ICD-10-CM | POA: Diagnosis not present

## 2022-11-29 DIAGNOSIS — R110 Nausea: Secondary | ICD-10-CM | POA: Diagnosis not present

## 2022-11-29 DIAGNOSIS — Z09 Encounter for follow-up examination after completed treatment for conditions other than malignant neoplasm: Secondary | ICD-10-CM

## 2022-11-29 DIAGNOSIS — E1140 Type 2 diabetes mellitus with diabetic neuropathy, unspecified: Secondary | ICD-10-CM

## 2022-11-29 MED ORDER — ONDANSETRON HCL 4 MG PO TABS
4.0000 mg | ORAL_TABLET | Freq: Three times a day (TID) | ORAL | 0 refills | Status: DC | PRN
Start: 2022-11-29 — End: 2023-08-16

## 2022-11-29 NOTE — Assessment & Plan Note (Addendum)
During the recent hospital course RVR likely due to dehydration and acute metabolic encephalopathy Was placed on Cardizem drip, later transitioned to oral Metoprolol - dose increased to 150 mg QD Continue metoprolol and Eliquis

## 2022-11-29 NOTE — Assessment & Plan Note (Signed)
Likely due to poor p.o. intake and from Rybelsus Needs to maintenance small, frequent meals Zofran as needed for nausea

## 2022-11-29 NOTE — Progress Notes (Signed)
Established Patient Office Visit  Subjective:  Patient ID: Lisa Thornton, female    DOB: [birth year]  Age: 67 y.o. MRN: [id number]  CC:  Chief Complaint  Patient presents with   Hospitalization Follow-up    Hospital Follow up     HPI Lisa Thornton is a 68 y.o. female with past medical history of HTN, A. Fib., type 2 DM and breast ca s/p mastectomy and chemotherapy who presents for follow-up after recent hospitalization from 11/13/22-11/16/22.  She presented to the ER today with a 2-day history of feeling fatigued. No palpitations.  She was feeling extremely weak and had an episode of fall at home a day prior to her ER visit.  She did not get any medical evaluation on the same day, but continued to have fatigue.  She was found to have sepsis from UTI and A-fib with RVR.  She was given IV ceftriaxone and later transitioned to oral cefdinir for Klebsiella UTI.  She currently denies any dysuria, hematuria, fever, chills or vomiting.  She has chronic nausea, likely from poor p.o. intake and Rybelsus.  She was placed on Cardizem drip for A-fib with RVR and later was transitioned to oral metoprolol.  Her dose of metoprolol was adjusted to Toprol XL 150 mg daily by cardiology.  She denies any dyspnea or palpitations currently.  She was also given Jardiance 10 mg daily by cardiology for cardiomyopathy.    Past Medical History:  Diagnosis Date   Anemia 12/31/2017   Cancer (HCC)    left breast cancer   Chest pain    Diabetes mellitus without complication (HCC)    Hypertension     Past Surgical History:  Procedure Laterality Date   COLONOSCOPY WITH PROPOFOL N/A 09/27/2021   Procedure: COLONOSCOPY WITH PROPOFOL;  Surgeon: Dolores Frame, MD;  Location: [facility];  Service: Gastroenterology;  Laterality: N/A;  945   MASTECTOMY MODIFIED RADICAL Left 12/31/2017   Procedure: LEFT MODIFIED RADICAL MASTECTOMY;  Surgeon: Franky Macho, MD;  Location: [facility];  Service: General;   Laterality: Left;   POLYPECTOMY  09/27/2021   Procedure: POLYPECTOMY;  Surgeon: Dolores Frame, MD;  Location: [facility];  Service: Gastroenterology;;   PORTACATH PLACEMENT Right 06/27/2017   Procedure: INSERTION PORT-A-CATH;  Surgeon: Franky Macho, MD;  Location: [facility];  Service: General;  Laterality: Right;    Family History  Problem Relation Age of Onset   Breast cancer Mother    Thyroid disease Mother    Heart disease Mother    Heart attack Father    Heart attack Sister    Hypertension Sister    Heart attack Brother    Cancer Sister    Stroke Brother    Alzheimer's disease Maternal Aunt     Social History   Socioeconomic History   Marital status: Divorced    Spouse name: Not on file   Number of children: 2   Years of education: 10   Highest education level: 10th grade  Occupational History   Occupation: Psychologist, 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  Tobacco Use   Smoking status: Never   Smokeless tobacco: Never  Vaping Use   Vaping status: Never Used  Substance and Sexual Activity   Alcohol use: No   Drug use: No   Sexual activity: Not Currently  Other Topics Concern   Not on file  Social History Narrative   Lives with sister   2 children   Dog: Cozie      Enjoys: puzzles, sewing, and  walk      Diet: eats all food groups outside: leafy greens    Caffeine: limited, sweet tea   Water: 6-8 cups daily       No car; does have license, wears seat belt   Smoke detector at home   [place] area    Social Determinants of Health   Financial Resource Strain: Low Risk  (03/29/2021)   Overall Financial Resource Strain (CARDIA)    Difficulty of Paying Living Expenses: Not hard at all  Food Insecurity: No Food Insecurity (11/20/2022)   Hunger Vital Sign    Worried About Running Out of Food in the Last Year: Never true    Ran Out of Food in the Last Year: Never true  Transportation Needs: No Transportation Needs (11/20/2022)   PRAPARE - Administrator, Civil Service  (Medical): No    Lack of Transportation (Non-Medical): No  Physical Activity: Insufficiently Active (03/29/2021)   Exercise Vital Sign    Days of Exercise per Week: 7 days    Minutes of Exercise per Session: 20 min  Stress: No Stress Concern Present (03/29/2021)   [employer] of Occupational Health - Occupational Stress Questionnaire    Feeling of Stress : Not at all  Social Connections: Socially Isolated (03/29/2021)   Social Connection and Isolation Panel [NHANES]    Frequency of Communication with Friends and Family: More than three times a week    Frequency of Social Gatherings with Friends and Family: Once a week    Attends Religious Services: Never    Database administrator or Organizations: No    Attends Banker Meetings: Never    Marital Status: Divorced  Catering manager Violence: Not At Risk (11/13/2022)   Humiliation, Afraid, Rape, and Kick questionnaire    Fear of Current or Ex-Partner: No    Emotionally Abused: No    Physically Abused: No    Sexually Abused: No    Outpatient Medications Prior to Visit  Medication Sig Dispense Refill   Accu-Chek Softclix Lancets lancets USE 1  TO CHECK GLUCOSE UP TO 4 TIMES DAILY 100 each 0   anastrozole (ARIMIDEX) 1 MG tablet Take 1 tablet (1 mg total) by mouth daily. 90 tablet 0   apixaban (ELIQUIS) 5 MG TABS tablet Take 1 tablet (5 mg total) by mouth 2 (two) times daily. 180 tablet 3   atorvastatin (LIPITOR) 10 MG tablet Take 1 tablet (10 mg total) by mouth at bedtime. 90 tablet 0   blood glucose meter kit and supplies Dispense based on patient and insurance preference. Use up to four times daily as directed. (FOR ICD-10 E10.9, E11.9). 1 each 0   calcium carbonate (OS-CAL) 600 MG TABS tablet Take 600 mg by mouth daily with breakfast.     cholecalciferol (VITAMIN D3) 25 MCG (1000 UT) tablet Take 1,000 Units by mouth daily.     empagliflozin (JARDIANCE) 10 MG TABS tablet Take 1 tablet (10 mg total) by mouth daily. 90  tablet 3   furosemide (LASIX) 20 MG tablet Take 1 tablet (20 mg total) by mouth daily as needed for fluid or edema. 90 tablet 1   glucose blood (ACCU-CHEK GUIDE) test strip USE 1 STRIP TO CHECK GLUCOSE THREE TIMES DAILY IN THE MORNING, AT NOON, AND AT BEDTIME AS DIRECTED 100 each 0   losartan (COZAAR) 25 MG tablet Take 0.5 tablets (12.5 mg total) by mouth daily. 45 tablet 3   magnesium oxide (MAG-OX) 400 (240 Mg) MG tablet  Take 1 tablet (400 mg total) by mouth daily.     metFORMIN (GLUCOPHAGE) 1000 MG tablet Take 1 tablet (1,000 mg total) by mouth daily with breakfast. 90 tablet 1   metoprolol succinate (TOPROL-XL) 50 MG 24 hr tablet Take 3 tablets (150 mg total) by mouth daily. Take with or immediately following a meal. 270 tablet 3   Semaglutide (RYBELSUS) 7 MG TABS Take 1 tablet (7 mg total) by mouth daily. 90 tablet 1   UNABLE TO FIND 1 each by Does not apply route daily. Med Name: Shower Chair 1 each 0   UNABLE TO FIND 1 each by Does not apply route daily. Med Name: Bed side Commode to fix over toilet seat 1 each 0   No facility-administered medications prior to visit.    No Known Allergies  ROS Review of Systems  Constitutional:  Positive for fatigue. Negative for chills and fever.  HENT:  Negative for congestion, sinus pressure, sinus pain and sore throat.   Eyes:  Negative for pain and discharge.  Respiratory:  Negative for cough and shortness of breath.   Cardiovascular:  Negative for chest pain and palpitations.  Gastrointestinal:  Positive for nausea. Negative for abdominal pain, diarrhea and vomiting.  Endocrine: Negative for polydipsia and polyuria.  Genitourinary:  Negative for dysuria, frequency and hematuria.  Musculoskeletal:  Negative for neck pain and neck stiffness.  Skin:  Negative for rash.  Neurological:  Negative for dizziness and weakness.  Psychiatric/Behavioral:  Negative for agitation and behavioral problems.       Objective:    Physical Exam Vitals  reviewed.  Constitutional:      General: She is not in acute distress.    Appearance: She is not diaphoretic.  HENT:     Head: Normocephalic and atraumatic.     Nose: Nose normal. No congestion.     Mouth/Throat:     Mouth: Mucous membranes are moist.     Pharynx: No posterior oropharyngeal erythema.  Eyes:     General: No scleral icterus.    Extraocular Movements: Extraocular movements intact.  Neck:     Vascular: No carotid bruit.  Cardiovascular:     Rate and Rhythm: Normal rate. Rhythm irregular.     Pulses: Normal pulses.     Heart sounds: Normal heart sounds. No murmur heard. Pulmonary:     Breath sounds: Normal breath sounds. No wheezing or rales.  Abdominal:     Palpations: Abdomen is soft.     Tenderness: There is no abdominal tenderness.  Musculoskeletal:     Cervical back: Neck supple. No tenderness.     Right lower leg: No edema.     Left lower leg: No edema.  Skin:    General: Skin is warm.     Findings: No rash.  Neurological:     General: No focal deficit present.     Mental Status: She is alert and oriented to person, place, and time.  Psychiatric:        Mood and Affect: Mood normal.        Behavior: Behavior normal.     BP 120/62 (BP Location: Right Arm, Patient Position: Sitting, Cuff Size: Normal)   Pulse 66   Ht 5\' 4"  (1.626 m)   Wt 143 lb (64.9 kg)   SpO2 94%   BMI 24.55 kg/m  Wt Readings from Last 3 Encounters:  11/29/22 143 lb (64.9 kg)  11/28/22 147 lb (66.7 kg)  11/15/22 148 lb 5.9 oz (67.3 kg)  Lab Results  Component Value Date   TSH 1.480 12/12/2021   Lab Results  Component Value Date   WBC 6.2 11/16/2022   HGB 10.3 (L) 11/16/2022   HCT 30.9 (L) 11/16/2022   MCV 91.4 11/16/2022   PLT 162 11/16/2022   Lab Results  Component Value Date   NA 137 11/16/2022   K 3.5 11/16/2022   CO2 24 11/16/2022   GLUCOSE 165 (H) 11/16/2022   BUN 19 11/16/2022   CREATININE 0.99 11/16/2022   BILITOT 0.7 11/13/2022   ALKPHOS 55  11/13/2022   AST 69 (H) 11/13/2022   ALT 25 11/13/2022   PROT 7.7 11/13/2022   ALBUMIN 3.3 (L) 11/13/2022   CALCIUM 8.8 (L) 11/16/2022   ANIONGAP 7 11/16/2022   EGFR 37 (L) 09/06/2022   Lab Results  Component Value Date   CHOL 212 (H) 12/12/2021   Lab Results  Component Value Date   HDL 39 (L) 12/12/2021   Lab Results  Component Value Date   LDLCALC 136 (H) 12/12/2021   Lab Results  Component Value Date   TRIG 203 (H) 12/12/2021   Lab Results  Component Value Date   CHOLHDL 5.4 (H) 12/12/2021   Lab Results  Component Value Date   HGBA1C 7.5 (H) 11/15/2022      Assessment & Plan:   Problem List Items Addressed This Visit       Cardiovascular and Mediastinum   Cardiomyopathy (HCC) (Chronic)    Echocardiogram during hospital course showed LVEF of 45 to 50% Followed by cardiology On Jardiance 10 mg QD now Continue Lasix, but due to recent AKI, advised to decrease dose of Lasix to 20 mg QD Appears euvolemic today      Atrial fibrillation with rapid ventricular response (HCC) - Primary    During the recent hospital course RVR likely due to dehydration and acute metabolic encephalopathy Was placed on Cardizem drip, later transitioned to oral Metoprolol - dose increased to 150 mg QD Continue metoprolol and Eliquis        Endocrine   Type 2 diabetes mellitus with diabetic neuropathy, unspecified (HCC) (Chronic)    Lab Results  Component Value Date   HGBA1C 7.5 (H) 11/15/2022   Uncontrolled Associated with HTN and HLD On Metformin 1000 mg QD and Rybelsus 7 mg QD Recently added Jardiance 10 mg QD for cardiomyopathy Advised to follow diabetic diet On statin F/u CMP and lipid panel Diabetic eye exam: Advised to follow up with Ophthalmology for diabetic eye exam  Mild numbness of LE likely due to diabetic neuropathy, avoid adding gabapentin due to sedative effect for now        Other   Sepsis secondary to UTI (HCC)    Sepsis resolved now, was likely due  to UTI-completed oral cefdinir      Iron deficiency anemia    Likely due to poor nutrition and chronic anticoagulation Check CBC Advised to take iron supplement      Hospital discharge follow-up    Hospital chart reviewed, including discharge summary Medications reconciled and reviewed with the patient in detail      Nausea    Likely due to poor p.o. intake and from Rybelsus Needs to maintenance small, frequent meals Zofran as needed for nausea      Relevant Medications   ondansetron (ZOFRAN) 4 MG tablet    Meds ordered this encounter  Medications   ondansetron (ZOFRAN) 4 MG tablet    Sig: Take 1 tablet (4 mg total) by mouth  every 8 (eight) hours as needed for nausea or vomiting.    Dispense:  20 tablet    Refill:  0    Follow-up: Return if symptoms worsen or fail to improve.    Anabel Halon, MD

## 2022-11-29 NOTE — Assessment & Plan Note (Addendum)
Lab Results  Component Value Date   HGBA1C 7.5 (H) 11/15/2022   Uncontrolled Associated with HTN and HLD On Metformin 1000 mg QD and Rybelsus 7 mg QD Recently added Jardiance 10 mg QD for cardiomyopathy Advised to follow diabetic diet On statin F/u CMP and lipid panel Diabetic eye exam: Advised to follow up with Ophthalmology for diabetic eye exam  Mild numbness of LE likely due to diabetic neuropathy, avoid adding gabapentin due to sedative effect for now

## 2022-11-29 NOTE — Assessment & Plan Note (Signed)
Sepsis resolved now, was likely due to UTI-completed oral cefdinir

## 2022-11-29 NOTE — Assessment & Plan Note (Signed)
Hospital chart reviewed, including discharge summary Medications reconciled and reviewed with the patient in detail 

## 2022-11-29 NOTE — Assessment & Plan Note (Signed)
Likely due to poor nutrition and chronic anticoagulation Check CBC Advised to take iron supplement

## 2022-11-29 NOTE — Patient Instructions (Addendum)
Please start taking ferrous sulphate 325 mg once daily.  Please maintain adequate hydration by taking at least 50 ounces of fluid intake.  Take Zofran as needed for nausea. Please have small, frequent meals. Please continue to take medications as prescribed.  Please continue to follow low carb diet and perform moderate exercise/walking at least 150 mins/week.  Please get fasting blood tests done before the next visit.

## 2022-11-29 NOTE — Assessment & Plan Note (Addendum)
Echocardiogram during hospital course showed LVEF of 45 to 50% Followed by cardiology On Jardiance 10 mg QD now Continue Lasix, but due to recent AKI, advised to decrease dose of Lasix to 20 mg QD Appears euvolemic today

## 2022-11-30 LAB — BASIC METABOLIC PANEL
BUN/Creatinine Ratio: 15 (ref 12–28)
BUN: 20 mg/dL (ref 8–27)
CO2: 22 mmol/L (ref 20–29)
Calcium: 9.8 mg/dL (ref 8.7–10.3)
Chloride: 104 mmol/L (ref 96–106)
Creatinine, Ser: 1.31 mg/dL — ABNORMAL HIGH (ref 0.57–1.00)
Glucose: 175 mg/dL — ABNORMAL HIGH (ref 70–99)
Potassium: 4.1 mmol/L (ref 3.5–5.2)
Sodium: 142 mmol/L (ref 134–144)
eGFR: 44 mL/min/{1.73_m2} — ABNORMAL LOW (ref >59)

## 2022-11-30 LAB — MAGNESIUM: Magnesium: 1.8 mg/dL (ref 1.6–2.3)

## 2022-12-01 ENCOUNTER — Telehealth: Payer: Self-pay

## 2022-12-01 DIAGNOSIS — Z79899 Other long term (current) drug therapy: Secondary | ICD-10-CM

## 2022-12-01 NOTE — Telephone Encounter (Signed)
Spoke to pt and niece per pts request. Pt verbalized understanding of results Pts niece stated that pt is currently taking Mag-Ox 400 mg tablets- OTC. Pt agreeable to repeat labs in 2-3w per providers request. Will enter labs for [facility].

## 2022-12-01 NOTE — Telephone Encounter (Signed)
-----   Message from [place] sent at 11/30/2022 11:49 AM EDT ----- Please let the patient know that her kidney function does suggest some dehydration as creatinine is at 1.31 but this is similar to prior values earlier this year. Sodium and potassium are within a normal range. Her magnesium is low at 1.8 and would recommend starting mag oxide 400 mg daily. Recheck BMET and Mg in 2-3 weeks.

## 2022-12-07 ENCOUNTER — Other Ambulatory Visit: Payer: Self-pay

## 2022-12-07 MED ORDER — ATORVASTATIN CALCIUM 10 MG PO TABS: 10.0000 mg | ORAL_TABLET | Freq: Every day | ORAL | 0 refills | Status: DC

## 2022-12-18 DIAGNOSIS — I480 Paroxysmal atrial fibrillation: Secondary | ICD-10-CM | POA: Diagnosis not present

## 2022-12-28 DIAGNOSIS — G4733 Obstructive sleep apnea (adult) (pediatric): Secondary | ICD-10-CM | POA: Diagnosis not present

## 2023-01-02 ENCOUNTER — Other Ambulatory Visit: Payer: Self-pay | Admitting: Internal Medicine

## 2023-01-02 DIAGNOSIS — E1165 Type 2 diabetes mellitus with hyperglycemia: Secondary | ICD-10-CM

## 2023-01-03 ENCOUNTER — Other Ambulatory Visit: Payer: Self-pay

## 2023-01-03 DIAGNOSIS — E1140 Type 2 diabetes mellitus with diabetic neuropathy, unspecified: Secondary | ICD-10-CM

## 2023-01-03 MED ORDER — RYBELSUS 7 MG PO TABS
7.0000 mg | ORAL_TABLET | Freq: Every day | ORAL | 1 refills | Status: DC
Start: 2023-01-03 — End: 2023-08-16

## 2023-01-08 ENCOUNTER — Encounter: Payer: 59 | Admitting: Internal Medicine

## 2023-01-11 ENCOUNTER — Other Ambulatory Visit: Payer: Self-pay | Admitting: Internal Medicine

## 2023-01-11 DIAGNOSIS — Z170 Estrogen receptor positive status [ER+]: Secondary | ICD-10-CM

## 2023-01-19 ENCOUNTER — Telehealth: Payer: Self-pay | Admitting: *Deleted

## 2023-01-19 MED ORDER — METOPROLOL SUCCINATE ER 100 MG PO TB24: 100.0000 mg | ORAL_TABLET | Freq: Two times a day (BID) | ORAL | 3 refills | Status: DC

## 2023-01-19 NOTE — Telephone Encounter (Signed)
Pt's niece notified and order placed.

## 2023-01-19 NOTE — Telephone Encounter (Signed)
-----   Message from Ellsworth Lennox sent at 01/19/2023 12:02 PM EDT ----- Please let the patient know that her event monitor showed she is in atrial fibrillation 100% of the time. Her heart rate ranged from 60 to 194 bpm with an average of 94 bpm. If blood pressure has been stable when checked at home, I would recommend that we attempt to titrate Toprol-XL from 150mg  daily to 200mg  daily. Can take 100mg  in AM/100mg  in PM. She currently has 50mg  tablets and can send in 100mg  tablets to reduce her pill burden. I would encourage them to follow BP with this change.

## 2023-01-22 ENCOUNTER — Ambulatory Visit: Payer: 59 | Admitting: Internal Medicine

## 2023-01-22 ENCOUNTER — Encounter: Payer: Self-pay | Admitting: Internal Medicine

## 2023-01-22 VITALS — BP 122/78 | HR 79 | Resp 16 | Ht 64.0 in | Wt 151.6 lb

## 2023-01-22 DIAGNOSIS — C50412 Malignant neoplasm of upper-outer quadrant of left female breast: Secondary | ICD-10-CM | POA: Diagnosis not present

## 2023-01-22 DIAGNOSIS — E1140 Type 2 diabetes mellitus with diabetic neuropathy, unspecified: Secondary | ICD-10-CM

## 2023-01-22 DIAGNOSIS — Z170 Estrogen receptor positive status [ER+]: Secondary | ICD-10-CM | POA: Diagnosis not present

## 2023-01-22 DIAGNOSIS — Z23 Encounter for immunization: Secondary | ICD-10-CM | POA: Diagnosis not present

## 2023-01-22 DIAGNOSIS — I480 Paroxysmal atrial fibrillation: Secondary | ICD-10-CM | POA: Diagnosis not present

## 2023-01-22 DIAGNOSIS — E782 Mixed hyperlipidemia: Secondary | ICD-10-CM | POA: Diagnosis not present

## 2023-01-22 DIAGNOSIS — Z7984 Long term (current) use of oral hypoglycemic drugs: Secondary | ICD-10-CM | POA: Diagnosis not present

## 2023-01-22 DIAGNOSIS — Z0001 Encounter for general adult medical examination with abnormal findings: Secondary | ICD-10-CM

## 2023-01-22 DIAGNOSIS — R5382 Chronic fatigue, unspecified: Secondary | ICD-10-CM

## 2023-01-22 DIAGNOSIS — I10 Essential (primary) hypertension: Secondary | ICD-10-CM

## 2023-01-22 NOTE — Assessment & Plan Note (Addendum)
Lab Results  Component Value Date   HGBA1C 7.5 (H) 11/15/2022   Uncontrolled, needs to improve diet Associated with HTN and HLD On Metformin 1000 mg QD and Rybelsus 7 mg QD Recently added Jardiance 10 mg QD for cardiomyopathy Advised to follow diabetic diet On statin F/u CMP, HbA1c and lipid panel Diabetic eye exam: Advised to follow up with Ophthalmology for diabetic eye exam  Mild numbness of LE likely due to diabetic neuropathy, avoid adding gabapentin due to sedative effect for now

## 2023-01-22 NOTE — Progress Notes (Signed)
Established Patient Office Visit  Subjective:  Patient ID: Lisa Thornton, female    DOB: 09-28-1955  Age: 67 y.o. MRN: [id number]  CC:  Chief Complaint  Patient presents with   Annual Exam    HPI Lisa Thornton is a 67 y.o. female with past medical history of HTN, A. Fib., type 2 DM and breast ca s/p mastectomy and chemotherapy who presents for annual physical.  Type II DM: Her last HbA1c was 7.5 in 08/24.  She  takes metformin 1000 mg QD, Rybelsus 7 mg QD and Jardiance 10 mg QD currently.  Her blood glucose has been above 150 most of the time.  She reports that she eats sweets, and admits that she needs to cut down.  Denies any polyuria or polyphagia. Denies any dysuria or hematuria.  HTN: BP is well-controlled. Takes medications regularly. Patient denies headache, dizziness, chest pain, dyspnea or palpitations.   Atrial fibrillation: She has had atrial fibrillation since 2023.  She is on Eliquis 5 mg BID and Metoprolol 100 mg BID, followed by cardiology. She had A fib. With RVR in 08/24 due to sepsis and was admitted at [facility]. She denies any dizziness or palpitations currently.  Denies any active bleeding.    Past Medical History:  Diagnosis Date   Anemia 12/31/2017   Cancer (HCC)    left breast cancer   Chest pain    Diabetes mellitus without complication (HCC)    Hypertension     Past Surgical History:  Procedure Laterality Date   COLONOSCOPY WITH PROPOFOL N/A 09/27/2021   Procedure: COLONOSCOPY WITH PROPOFOL;  Surgeon: Dolores Frame, MD;  Location: [facility];  Service: Gastroenterology;  Laterality: N/A;  945   MASTECTOMY MODIFIED RADICAL Left 12/31/2017   Procedure: LEFT MODIFIED RADICAL MASTECTOMY;  Surgeon: Franky Macho, MD;  Location: [facility];  Service: General;  Laterality: Left;   POLYPECTOMY  09/27/2021   Procedure: POLYPECTOMY;  Surgeon: Dolores Frame, MD;  Location: [facility];  Service: Gastroenterology;;   PORTACATH  PLACEMENT Right 06/27/2017   Procedure: INSERTION PORT-A-CATH;  Surgeon: Franky Macho, MD;  Location: [facility];  Service: General;  Laterality: Right;    Family History  Problem Relation Age of Onset   Breast cancer Mother    Thyroid disease Mother    Heart disease Mother    Heart attack Father    Heart attack Sister    Hypertension Sister    Heart attack Brother    Cancer Sister    Stroke Brother    Alzheimer's disease Maternal Aunt     Social History   Socioeconomic History   Marital status: Divorced    Spouse name: Not on file   Number of children: 2   Years of education: 10   Highest education level: 10th grade  Occupational History   Occupation: Psychologist, 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  Tobacco Use   Smoking status: Never   Smokeless tobacco: Never  Vaping Use   Vaping status: Never Used  Substance and Sexual Activity   Alcohol use: No   Drug use: No   Sexual activity: Not Currently  Other Topics Concern   Not on file  Social History Narrative   Lives with sister   2 children   Dog: Cozie      Enjoys: puzzles, sewing, and walk      Diet: eats all food groups outside: leafy greens    Caffeine: limited, sweet tea   Water: 6-8 cups daily  No car; does have license, wears seat belt   Smoke detector at home   [place] area    Social Determinants of Health   Financial Resource Strain: Low Risk  (03/29/2021)   Overall Financial Resource Strain (CARDIA)    Difficulty of Paying Living Expenses: Not hard at all  Food Insecurity: No Food Insecurity (11/20/2022)   Hunger Vital Sign    Worried About Running Out of Food in the Last Year: Never true    Ran Out of Food in the Last Year: Never true  Transportation Needs: No Transportation Needs (11/20/2022)   PRAPARE - Administrator, Civil Service (Medical): No    Lack of Transportation (Non-Medical): No  Physical Activity: Insufficiently Active (03/29/2021)   Exercise Vital Sign    Days of Exercise per Week: 7 days     Minutes of Exercise per Session: 20 min  Stress: No Stress Concern Present (03/29/2021)   [employer] of Occupational Health - Occupational Stress Questionnaire    Feeling of Stress : Not at all  Social Connections: Socially Isolated (03/29/2021)   Social Connection and Isolation Panel [NHANES]    Frequency of Communication with Friends and Family: More than three times a week    Frequency of Social Gatherings with Friends and Family: Once a week    Attends Religious Services: Never    Database administrator or Organizations: No    Attends Banker Meetings: Never    Marital Status: Divorced  Catering manager Violence: Not At Risk (11/13/2022)   Humiliation, Afraid, Rape, and Kick questionnaire    Fear of Current or Ex-Partner: No    Emotionally Abused: No    Physically Abused: No    Sexually Abused: No    Outpatient Medications Prior to Visit  Medication Sig Dispense Refill   Accu-Chek Softclix Lancets lancets USE 1  TO CHECK GLUCOSE UP TO 4 TIMES DAILY 100 each 0   anastrozole (ARIMIDEX) 1 MG tablet Take 1 tablet by mouth once daily 90 tablet 0   apixaban (ELIQUIS) 5 MG TABS tablet Take 1 tablet (5 mg total) by mouth 2 (two) times daily. 180 tablet 3   atorvastatin (LIPITOR) 10 MG tablet Take 1 tablet (10 mg total) by mouth at bedtime. 90 tablet 0   blood glucose meter kit and supplies Dispense based on patient and insurance preference. Use up to four times daily as directed. (FOR ICD-10 E10.9, E11.9). 1 each 0   calcium carbonate (OS-CAL) 600 MG TABS tablet Take 600 mg by mouth daily with breakfast.     cholecalciferol (VITAMIN D3) 25 MCG (1000 UT) tablet Take 1,000 Units by mouth daily.     empagliflozin (JARDIANCE) 10 MG TABS tablet Take 1 tablet (10 mg total) by mouth daily. 90 tablet 3   furosemide (LASIX) 20 MG tablet Take 1 tablet (20 mg total) by mouth daily as needed for fluid or edema. 90 tablet 1   glucose blood (ACCU-CHEK GUIDE) test strip USE 1 STRIP TO  CHECK GLUCOSE THREE TIMES DAILY MORNING,  NOON  AND  AT  BEDTIME  AS  DIRECTED 100 each 0   losartan (COZAAR) 25 MG tablet Take 0.5 tablets (12.5 mg total) by mouth daily. 45 tablet 3   magnesium oxide (MAG-OX) 400 (240 Mg) MG tablet Take 1 tablet (400 mg total) by mouth daily.     metFORMIN (GLUCOPHAGE) 1000 MG tablet Take 1 tablet (1,000 mg total) by mouth daily with breakfast. 90 tablet 1  metoprolol succinate (TOPROL-XL) 100 MG 24 hr tablet Take 1 tablet (100 mg total) by mouth 2 (two) times daily. Take with or immediately following a meal. 180 tablet 3   ondansetron (ZOFRAN) 4 MG tablet Take 1 tablet (4 mg total) by mouth every 8 (eight) hours as needed for nausea or vomiting. 20 tablet 0   Semaglutide (RYBELSUS) 7 MG TABS Take 1 tablet (7 mg total) by mouth daily. 90 tablet 1   UNABLE TO FIND 1 each by Does not apply route daily. Med Name: Shower Chair 1 each 0   UNABLE TO FIND 1 each by Does not apply route daily. Med Name: Bed side Commode to fix over toilet seat 1 each 0   No facility-administered medications prior to visit.    No Known Allergies  ROS Review of Systems  Constitutional:  Positive for fatigue. Negative for chills and fever.  HENT:  Negative for congestion, sinus pressure, sinus pain and sore throat.   Eyes:  Negative for pain and discharge.  Respiratory:  Negative for cough and shortness of breath.   Cardiovascular:  Negative for chest pain and palpitations.  Gastrointestinal:  Positive for nausea. Negative for abdominal pain, diarrhea and vomiting.  Endocrine: Negative for polydipsia and polyuria.  Genitourinary:  Negative for dysuria, frequency and hematuria.  Musculoskeletal:  Negative for neck pain and neck stiffness.  Skin:  Negative for rash.  Neurological:  Negative for dizziness and weakness.  Psychiatric/Behavioral:  Negative for agitation and behavioral problems.       Objective:    Physical Exam Vitals reviewed.  Constitutional:      General:  She is not in acute distress.    Appearance: She is not diaphoretic.  HENT:     Head: Normocephalic and atraumatic.     Nose: Nose normal. No congestion.     Mouth/Throat:     Mouth: Mucous membranes are moist.     Pharynx: No posterior oropharyngeal erythema.  Eyes:     General: No scleral icterus.    Extraocular Movements: Extraocular movements intact.  Neck:     Vascular: No carotid bruit.  Cardiovascular:     Rate and Rhythm: Normal rate. Rhythm irregular.     Pulses: Normal pulses.     Heart sounds: Normal heart sounds. No murmur heard. Pulmonary:     Breath sounds: Normal breath sounds. No wheezing or rales.  Abdominal:     Palpations: Abdomen is soft.     Tenderness: There is no abdominal tenderness.  Musculoskeletal:     Cervical back: Neck supple. No tenderness.     Right lower leg: No edema.     Left lower leg: No edema.  Skin:    General: Skin is warm.     Findings: No rash.  Neurological:     General: No focal deficit present.     Mental Status: She is alert and oriented to person, place, and time.     Cranial Nerves: No cranial nerve deficit.     Sensory: No sensory deficit.     Motor: No weakness.  Psychiatric:        Mood and Affect: Mood normal.        Behavior: Behavior normal.     BP 122/78   Pulse 79   Resp 16   Ht 5\' 4"  (1.626 m)   Wt 151 lb 9.6 oz (68.8 kg)   SpO2 97%   BMI 26.02 kg/m  Wt Readings from Last 3 Encounters:  01/22/23  151 lb 9.6 oz (68.8 kg)  11/29/22 143 lb (64.9 kg)  11/28/22 147 lb (66.7 kg)    Lab Results  Component Value Date   TSH 1.480 12/12/2021   Lab Results  Component Value Date   WBC 6.2 11/16/2022   HGB 10.3 (L) 11/16/2022   HCT 30.9 (L) 11/16/2022   MCV 91.4 11/16/2022   PLT 162 11/16/2022   Lab Results  Component Value Date   NA 142 11/29/2022   K 4.1 11/29/2022   CO2 22 11/29/2022   GLUCOSE 175 (H) 11/29/2022   BUN 20 11/29/2022   CREATININE 1.31 (H) 11/29/2022   BILITOT 0.7 11/13/2022    ALKPHOS 55 11/13/2022   AST 69 (H) 11/13/2022   ALT 25 11/13/2022   PROT 7.7 11/13/2022   ALBUMIN 3.3 (L) 11/13/2022   CALCIUM 9.8 11/29/2022   ANIONGAP 7 11/16/2022   EGFR 44 (L) 11/29/2022   Lab Results  Component Value Date   CHOL 212 (H) 12/12/2021   Lab Results  Component Value Date   HDL 39 (L) 12/12/2021   Lab Results  Component Value Date   LDLCALC 136 (H) 12/12/2021   Lab Results  Component Value Date   TRIG 203 (H) 12/12/2021   Lab Results  Component Value Date   CHOLHDL 5.4 (H) 12/12/2021   Lab Results  Component Value Date   HGBA1C 7.5 (H) 11/15/2022      Assessment & Plan:   Problem List Items Addressed This Visit       Cardiovascular and Mediastinum   Atrial fibrillation (HCC) (Chronic)    CHADS-VASc - 4.  On Eliquis for [facility] On metoprolol 100 mg BID for rate control Followed by cardiology      Relevant Orders   CBC with Differential/Platelet   CMP14+EGFR   TSH + free T4   Essential hypertension    BP Readings from Last 1 Encounters:  01/22/23 122/78   Well-controlled with metoprolol 100 mg BID Recently discontinued amlodipine, HCTZ due to hypotension and AKI Counseled for compliance with the medications Advised DASH diet and moderate exercise/walking as tolerated      Relevant Orders   CBC with Differential/Platelet   CMP14+EGFR     Endocrine   Type 2 diabetes mellitus with diabetic neuropathy, unspecified (HCC) (Chronic)    Lab Results  Component Value Date   HGBA1C 7.5 (H) 11/15/2022   Uncontrolled, needs to improve diet Associated with HTN and HLD On Metformin 1000 mg QD and Rybelsus 7 mg QD Recently added Jardiance 10 mg QD for cardiomyopathy Advised to follow diabetic diet On statin F/u CMP, HbA1c and lipid panel Diabetic eye exam: Advised to follow up with Ophthalmology for diabetic eye exam  Mild numbness of LE likely due to diabetic neuropathy, avoid adding gabapentin due to sedative effect for now      Relevant  Orders   CMP14+EGFR   Hemoglobin A1c   Urine Microalbumin w/creat. ratio     Other   HLD (hyperlipidemia) (Chronic)    On Lipitor 10 mg once daily Check lipid profile      Relevant Orders   Lipid Profile   Breast cancer of upper-outer quadrant of left female breast [facility])    Has lost follow up with Oncology, advised to schedule appointment S/p mastectomy and chemotherapy On Anastrazole since 11/19      Relevant Orders   CBC with Differential/Platelet   CMP14+EGFR   Encounter for general adult medical examination with abnormal findings - Primary  Physical exam as documented. Fasting blood tests ordered today. Flu vaccine today.      Fatigue    Chronic, likely due to B-blocker and chronic medical conditions Check CBC, CMP and TSH Age-appropriate cancer screening      Other Visit Diagnoses     Encounter for immunization       Relevant Orders   Flu Vaccine Trivalent High Dose (Fluad) (Completed)       No orders of the defined types were placed in this encounter.   Follow-up: Return in about 4 months (around 05/25/2023) for DM.    Anabel Halon, MD

## 2023-01-22 NOTE — Assessment & Plan Note (Signed)
Has lost follow up with Oncology, advised to schedule appointment S/p mastectomy and chemotherapy On Anastrazole since 11/19

## 2023-01-22 NOTE — Assessment & Plan Note (Signed)
On Lipitor 10 mg once daily Check lipid profile

## 2023-01-22 NOTE — Assessment & Plan Note (Addendum)
Physical exam as documented. Fasting blood tests ordered today. Flu vaccine today.

## 2023-01-22 NOTE — Assessment & Plan Note (Addendum)
CHADS-VASc - 4.  On Eliquis for [facility] On metoprolol 100 mg BID for rate control Followed by cardiology

## 2023-01-22 NOTE — Assessment & Plan Note (Signed)
Chronic, likely due to B-blocker and chronic medical conditions Check CBC, CMP and TSH Age-appropriate cancer screening

## 2023-01-22 NOTE — Assessment & Plan Note (Addendum)
BP Readings from Last 1 Encounters:  01/22/23 122/78   Well-controlled with metoprolol 100 mg BID Recently discontinued amlodipine, HCTZ due to hypotension and AKI Counseled for compliance with the medications Advised DASH diet and moderate exercise/walking as tolerated

## 2023-01-22 NOTE — Patient Instructions (Signed)
Please schedule follow up with Oncology clinic.  Please continue to take medications as prescribed.  Please continue to follow low carb diet and ambulate as tolerated.  Please get fasting blood tests done after 1 month.

## 2023-01-23 ENCOUNTER — Inpatient Hospital Stay: Payer: 59 | Attending: Hematology

## 2023-01-23 ENCOUNTER — Other Ambulatory Visit: Payer: Self-pay | Admitting: Physician Assistant

## 2023-01-23 DIAGNOSIS — M8580 Other specified disorders of bone density and structure, unspecified site: Secondary | ICD-10-CM

## 2023-01-23 DIAGNOSIS — C50412 Malignant neoplasm of upper-outer quadrant of left female breast: Secondary | ICD-10-CM | POA: Insufficient documentation

## 2023-01-23 DIAGNOSIS — Z9012 Acquired absence of left breast and nipple: Secondary | ICD-10-CM | POA: Diagnosis not present

## 2023-01-23 DIAGNOSIS — Z170 Estrogen receptor positive status [ER+]: Secondary | ICD-10-CM

## 2023-01-23 DIAGNOSIS — Z79811 Long term (current) use of aromatase inhibitors: Secondary | ICD-10-CM

## 2023-01-23 LAB — CBC WITH DIFFERENTIAL/PLATELET
Abs Immature Granulocytes: 0.02 10*3/uL (ref 0.00–0.07)
Basophils Absolute: 0 10*3/uL (ref 0.0–0.1)
Basophils Relative: 0 %
Eosinophils Absolute: 0.3 10*3/uL (ref 0.0–0.5)
Eosinophils Relative: 2 %
HCT: 36.2 % (ref 36.0–46.0)
Hemoglobin: 12.4 g/dL (ref 12.0–15.0)
Immature Granulocytes: 0 %
Lymphocytes Relative: 20 %
Lymphs Abs: 2.2 10*3/uL (ref 0.7–4.0)
MCH: 31.2 pg (ref 26.0–34.0)
MCHC: 34.3 g/dL (ref 30.0–36.0)
MCV: 91 fL (ref 80.0–100.0)
Monocytes Absolute: 0.6 10*3/uL (ref 0.1–1.0)
Monocytes Relative: 6 %
Neutro Abs: 7.5 10*3/uL (ref 1.7–7.7)
Neutrophils Relative %: 72 %
Platelets: 211 10*3/uL (ref 150–400)
RBC: 3.98 MIL/uL (ref 3.87–5.11)
RDW: 14 % (ref 11.5–15.5)
WBC: 10.6 10*3/uL — ABNORMAL HIGH (ref 4.0–10.5)
nRBC: 0 % (ref 0.0–0.2)

## 2023-01-23 LAB — COMPREHENSIVE METABOLIC PANEL
ALT: 24 U/L (ref 0–44)
AST: 36 U/L (ref 15–41)
Albumin: 3.5 g/dL (ref 3.5–5.0)
Alkaline Phosphatase: 65 U/L (ref 38–126)
Anion gap: 12 (ref 5–15)
BUN: 16 mg/dL (ref 8–23)
CO2: 24 mmol/L (ref 22–32)
Calcium: 9.6 mg/dL (ref 8.9–10.3)
Chloride: 102 mmol/L (ref 98–111)
Creatinine, Ser: 1.14 mg/dL — ABNORMAL HIGH (ref 0.44–1.00)
GFR, Estimated: 52 mL/min — ABNORMAL LOW (ref >60)
Glucose, Bld: 134 mg/dL — ABNORMAL HIGH (ref 70–99)
Potassium: 3.6 mmol/L (ref 3.5–5.1)
Sodium: 138 mmol/L (ref 135–145)
Total Bilirubin: 0.8 mg/dL (ref 0.3–1.2)
Total Protein: 7.6 g/dL (ref 6.5–8.1)

## 2023-01-23 LAB — VITAMIN D 25 HYDROXY (VIT D DEFICIENCY, FRACTURES): Vit D, 25-Hydroxy: 49.97 ng/mL (ref 30–100)

## 2023-01-25 LAB — PTH, INTACT AND CALCIUM
Calcium, Total (PTH): 9.9 mg/dL (ref 8.7–10.3)
PTH: 16 pg/mL (ref 15–65)

## 2023-01-27 DIAGNOSIS — G4733 Obstructive sleep apnea (adult) (pediatric): Secondary | ICD-10-CM | POA: Diagnosis not present

## 2023-01-28 NOTE — Progress Notes (Deleted)
[facility] [address], [place] [address]   CLINIC:  Medical Oncology/Hematology  PCP:  Lisa Halon, MD [address] / [place] [address] [phone]   REASON FOR VISIT:  Follow-up for history of stage IIIb poorly differentiated left breast cancer   PRIOR THERAPY: - Neoadjuvant dose dense AC x4 cycles from 07/18/2017 through 09/03/2017 - Paclitaxel weekly x12 cycles from 09/19/2017 through 12/06/2017 - Left modified radical mastectomy on 12/31/2017 - Herceptin x1 year, completed 03/04/2019   CURRENT THERAPY: Anastrozole (since 02/27/2018)  BRIEF ONCOLOGIC HISTORY:   Oncology History  Breast cancer of upper-outer quadrant of left female breast (HCC)  06/22/2017 Initial Diagnosis   Breast cancer of upper-outer quadrant of left female breast (HCC)   06/22/2017 Cancer Staging   Staging form: Breast, AJCC 8th Edition - Clinical stage from 06/22/2017: Stage IIIB (cT3, cN1, cM0, G3, ER+, PR-, HER2-) - Signed by Doreatha Massed, MD on 06/22/2017   06/29/2017 Imaging   CT chest showing left axillary adenopathy, 1.2 cm anterior carinal adenopathy, left subpectoral lymph node subcentimeter  06/25/2017 2D echocardiogram with ejection fraction of 55-60%   07/18/2017 - 12/06/2017 Chemotherapy   The patient had dexamethasone (DECADRON) 4 MG tablet, 1 of 1 cycle, Start date: --, End date: -- DOXOrubicin (ADRIAMYCIN) chemo injection 110 mg, 60 mg/m2 = 110 mg, Intravenous,  Once, 4 of 4 cycles Administration: 110 mg (07/18/2017), 110 mg (08/01/2017), 110 mg (08/20/2017), 110 mg (09/03/2017) palonosetron (ALOXI) injection 0.25 mg, 0.25 mg, Intravenous,  Once, 4 of 4 cycles Administration: 0.25 mg (07/18/2017), 0.25 mg (08/01/2017), 0.25 mg (08/20/2017), 0.25 mg (09/03/2017) pegfilgrastim-cbqv (UDENYCA) injection 6 mg, 6 mg, Subcutaneous, Once, 4 of 4 cycles Administration: 6 mg (07/19/2017), 6 mg (08/02/2017), 6 mg (08/22/2017), 6 mg (09/05/2017) cyclophosphamide (CYTOXAN) 1,100 mg  in sodium chloride 0.9 % 250 mL chemo infusion, 600 mg/m2 = 1,100 mg, Intravenous,  Once, 4 of 4 cycles Administration: 1,100 mg (07/18/2017), 1,100 mg (08/01/2017), 1,100 mg (08/20/2017), 1,100 mg (09/03/2017) PACLitaxel (TAXOL) 120 mg in sodium chloride 0.9 % 250 mL chemo infusion (</= 80mg /m2), 64 mg/m2 = 120 mg (80 % of original dose 80 mg/m2), Intravenous,  Once, 12 of 12 cycles Dose modification: 64 mg/m2 (80 % of original dose 80 mg/m2, Cycle 5, Reason: Provider Judgment), 60 mg/m2 (75 % of original dose 80 mg/m2, Cycle 15, Reason: Other (see comments)), 40 mg/m2 (50 % of original dose 80 mg/m2, Cycle 16, Reason: Other (see comments)) Administration: 120 mg (09/17/2017), 144 mg (09/24/2017), 144 mg (10/03/2017), 144 mg (10/11/2017), 144 mg (10/18/2017), 144 mg (10/25/2017), 144 mg (11/01/2017), 144 mg (11/08/2017), 144 mg (11/15/2017), 144 mg (11/22/2017), 108 mg (11/29/2017), 72 mg (12/06/2017) fosaprepitant (EMEND) 150 mg, dexamethasone (DECADRON) 12 mg in sodium chloride 0.9 % 145 mL IVPB, , Intravenous,  Once, 4 of 4 cycles Administration:  (07/18/2017),  (08/01/2017),  (08/20/2017),  (09/03/2017)  for chemotherapy treatment.    02/07/2018 - 03/04/2019 Chemotherapy   Patient is on Treatment Plan : BREAST Trastuzumab q21d      Radiation Therapy          CANCER STAGING: Cancer Staging  Breast cancer of upper-outer quadrant of left female breast [facility]) Staging form: Breast, AJCC 8th Edition - Clinical stage from 06/22/2017: Stage IIIB (cT3, cN1, cM0, G3, ER+, PR-, HER2-) - Signed by Doreatha Massed, MD on 06/22/2017   INTERVAL HISTORY:   Lisa Thornton, a 67 y.o. female, returns for routine follow-up of her left-sided breast cancer. [name]  was last seen on 12/27/2021 by Rojelio Brenner PA-C.  In the interim since her last visit, she has been hospitalized twice: 07/03/2022 through 07/10/2022: Hypoxic respiratory failure secondary to pulmonary edema and pneumonia with severe sepsis, bacteremia, UTI, heart  failure, and A-fib with RVR. 11/13/2022 through 11/16/2022: A-fib with RVR in the setting of sepsis due to Klebsiella UTI and bacteremia.  At today's visit, she  reports feeling ***.  She denies any recent hospitalizations, surgeries, or changes in her  baseline health status.  ***She denies any symptoms of recurrence such as new lumps, bone pain, chest pain, dyspnea, or abdominal pain.  She has no new headaches, seizures, or focal neurologic deficits.  No B symptoms such as fever, chills, night sweats, unintentional weight loss.   ***She continues to take Arimidex.  She reports mild hair loss and intermittent leg cramps at night.  *** ***No hot flashes or mood swings.   ***She takes 1000 units vitamin D twice daily and 600 mg calcium daily.    She reports ***% energy and ***% appetite.  She is maintaining stable weight at this time.   ASSESSMENT & PLAN:  1.  Poorly differentiated left-sided breast cancer, stage IIIb (s/p LEFT mastectomy) - On 06/12/2017 she had a left breast biopsy with sarcomatoid features, left axillary lymph node biopsy was negative. - Her 2D echo showed EF 55 to 60% - CT of the chest showed subcarinal adenopathy. - PET CT scan was negative for metastatic disease. -She had 4 cycles of neoadjuvant dose dense AC from 07/18/2017 through 09/03/2017. - She had 12 cycles of weekly paclitaxel from 09/17/2017 through 12/06/2017. -She underwent a left modified radical mastectomy on 12/31/2017. - The pathology revealed a 2.3 cm poorly differentiated invasive ductal carcinoma with sarcomatoid changes, resection margins negative, high-grade DCIS, no skin involvement, 0 out of 8 lymph nodes positive, negative for lymphovascular or perineural invasion.  ER positive, PR and HER-2 negative.  Pathological staging is [id number]. - Her receptors were rechecked on a mastectomy specimen and HER-2 was positive by IHC.  This was initially negative by FISH on biopsy specimen.  ER was negative on mastectomy  specimen.  It was 60% positive on biopsy specimen.  Ki 67 has decreased from 30% to 80% on biopsy.  This was reviewed by a second pathologist and confirmed. - It was recommended she have 1 year of Herceptin. Pertuzumab was not added secondary to lymph node negativity.  - 1 year of Herceptin completed on 03/04/2019. - Anastrozole started on 02/27/2018.*** Goal of treatment is 10 years.  *** - BCI testing (01/10/2022) showed statistically significant benefit from extended endocrine therapy.  (10.9% risk of late distant recurrence after 5 years of adjuvant endocrine therapy, compared to 3.6-4.6% risk of late distant recurrence after 10 years of adjuvant endocrine therapy). - Mammogram on 06/26/2022 of the right breast is BI-RADS Category 1, negative - *** Physical exam today: No discrete nodules or masses in right breast.  Left mastectomy site within normal limits.  No axillary, supraclavicular, pectoral, or epitrochlear lymphadenopathy - History/ROS ***. - Most recent labs (01/23/2023): Normal LFTs.  Kidney function at baseline (creatinine 1.14/GFR 52).  CBC unremarkable.  - No red flag symptoms of recurrence per ROS.*** - PLAN: Continue anastrozole daily.*** - Repeat labs and RTC in 6 months.  *** - Next mammogram due in March 2025***  2.  Osteopenia - DEXA (03/07/2018) showed T score -1.8 - DEXA (06/18/2020) showed T score -1.8, osteopenic - Most recent DEXA (06/26/2022): T-score -1.9,  osteopenic - She takes vitamin D 1000 units twice daily and calcium 600 mg daily  - Labs (01/23/2023) normal vitamin D 49.97, calcium 9.6 - PLAN:  Continue to recommend calcium, vitamin D, and weightbearing exercises for breast cancer therapy associated bone loss - Next DEXA due March 2026 ***   3.  Mild hypercalcemia - She had mildly elevated calcium level ranging from 10.4-11 since August 2022.  This could be secondary to vitamin D intake.  - Previous workup (12/19/2021): Calcium 10.0 per CMP.  Vitamin D 51.57.   Normal PTH 29, normal calcium 10.1 per intact PTH/calcium. - Most recent calcium 9.6 (NORMAL), with normal PTH (16) - PLAN: No further workup at this time, will continue periodic monitoring of calcium.  4.  Other history - Other PMH: Atrial fibrillation (Eliquis), hypertension, diabetes, sleep apnea, diabetes  PLAN SUMMARY: >> Mammogram due around 06/27/2023 *** >> Labs in 6 months = CBC/D, CMP, vitamin D *** >> OFFICE visit in 6 months (1 week after labs)    REVIEW OF SYSTEMS: ***  Review of Systems - Oncology  PHYSICAL EXAM:   Performance status (ECOG): {CHL ONC [id number]} *** There were no vitals filed for this visit. Wt Readings from Last 3 Encounters:  01/22/23 151 lb 9.6 oz (68.8 kg)  11/29/22 143 lb (64.9 kg)  11/28/22 147 lb (66.7 kg)   Physical Exam   PAST MEDICAL/SURGICAL HISTORY:  Past Medical History:  Diagnosis Date   Anemia 12/31/2017   Cancer (HCC)    left breast cancer   Chest pain    Diabetes mellitus without complication (HCC)    Hypertension    Past Surgical History:  Procedure Laterality Date   COLONOSCOPY WITH PROPOFOL N/A 09/27/2021   Procedure: COLONOSCOPY WITH PROPOFOL;  Surgeon: Dolores Frame, MD;  Location: [facility];  Service: Gastroenterology;  Laterality: N/A;  945   MASTECTOMY MODIFIED RADICAL Left 12/31/2017   Procedure: LEFT MODIFIED RADICAL MASTECTOMY;  Surgeon: Franky Macho, MD;  Location: [facility];  Service: General;  Laterality: Left;   POLYPECTOMY  09/27/2021   Procedure: POLYPECTOMY;  Surgeon: Dolores Frame, MD;  Location: [facility];  Service: Gastroenterology;;   PORTACATH PLACEMENT Right 06/27/2017   Procedure: INSERTION PORT-A-CATH;  Surgeon: Franky Macho, MD;  Location: [facility];  Service: General;  Laterality: Right;    SOCIAL HISTORY:  Social History   Socioeconomic History   Marital status: Divorced    Spouse name: Not on file   Number of children: 2   Years of education: 10   Highest  education level: 10th grade  Occupational History   Occupation: Psychologist, 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  Tobacco Use   Smoking status: Never   Smokeless tobacco: Never  Vaping Use   Vaping status: Never Used  Substance and Sexual Activity   Alcohol use: No   Drug use: No   Sexual activity: Not Currently  Other Topics Concern   Not on file  Social History Narrative   Lives with sister   2 children   Dog: Cozie      Enjoys: puzzles, sewing, and walk      Diet: eats all food groups outside: leafy greens    Caffeine: limited, sweet tea   Water: 6-8 cups daily       No car; does have license, wears seat belt   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   [place] area    Social Determinants of Health   Financial Resource Strain: Low Risk  (03/29/2021)   Overall Financial  Resource Strain (CARDIA)    Difficulty of Paying Living Expenses: Not hard at all  Food Insecurity: No Food Insecurity (11/20/2022)   Hunger Vital Sign    Worried About Running Out of Food in the Last Year: Never true    Ran Out of Food in the Last Year: Never true  Transportation Needs: No Transportation Needs (11/20/2022)   PRAPARE - Administrator, Civil Service (Medical): No    Lack of Transportation (Non-Medical): No  Physical Activity: Insufficiently Active (03/29/2021)   Exercise Vital Sign    Days of Exercise per Week: 7 days    Minutes of Exercise per Session: 20 min  Stress: No Stress Concern Present (03/29/2021)   [employer] of Occupational Health - Occupational Stress Questionnaire    Feeling of Stress : Not at all  Social Connections: Socially Isolated (03/29/2021)   Social Connection and Isolation Panel [NHANES]    Frequency of Communication with Friends and Family: More than three times a week    Frequency of Social Gatherings with Friends and Family: Once a week    Attends Religious Services: Never    Database administrator or Organizations: No    Attends Banker Meetings: Never    Marital Status:  Divorced  Catering manager Violence: Not At Risk (11/13/2022)   Humiliation, Afraid, Rape, and Kick questionnaire    Fear of Current or Ex-Partner: No    Emotionally Abused: No    Physically Abused: No    Sexually Abused: No    FAMILY HISTORY:  Family History  Problem Relation Age of Onset   Breast cancer Mother    Thyroid disease Mother    Heart disease Mother    Heart attack Father    Heart attack Sister    Hypertension Sister    Heart attack Brother    Cancer Sister    Stroke Brother    Alzheimer's disease Maternal Aunt     CURRENT MEDICATIONS:  Current Outpatient Medications  Medication Sig Dispense Refill   Accu-Chek Softclix Lancets lancets USE 1  TO CHECK GLUCOSE UP TO 4 TIMES DAILY 100 each 0   anastrozole (ARIMIDEX) 1 MG tablet Take 1 tablet by mouth once daily 90 tablet 0   apixaban (ELIQUIS) 5 MG TABS tablet Take 1 tablet (5 mg total) by mouth 2 (two) times daily. 180 tablet 3   atorvastatin (LIPITOR) 10 MG tablet Take 1 tablet (10 mg total) by mouth at bedtime. 90 tablet 0   blood glucose meter kit and supplies Dispense based on patient and insurance preference. Use up to four times daily as directed. (FOR ICD-10 E10.9, E11.9). 1 each 0   calcium carbonate (OS-CAL) 600 MG TABS tablet Take 600 mg by mouth daily with breakfast.     cholecalciferol (VITAMIN D3) 25 MCG (1000 UT) tablet Take 1,000 Units by mouth daily.     empagliflozin (JARDIANCE) 10 MG TABS tablet Take 1 tablet (10 mg total) by mouth daily. 90 tablet 3   furosemide (LASIX) 20 MG tablet Take 1 tablet (20 mg total) by mouth daily as needed for fluid or edema. 90 tablet 1   glucose blood (ACCU-CHEK GUIDE) test strip USE 1 STRIP TO CHECK GLUCOSE THREE TIMES DAILY MORNING,  NOON  AND  AT  BEDTIME  AS  DIRECTED 100 each 0   losartan (COZAAR) 25 MG tablet Take 0.5 tablets (12.5 mg total) by mouth daily. 45 tablet 3   magnesium oxide (MAG-OX) 400 (240 Mg)  MG tablet Take 1 tablet (400 mg total) by mouth daily.      metFORMIN (GLUCOPHAGE) 1000 MG tablet Take 1 tablet (1,000 mg total) by mouth daily with breakfast. 90 tablet 1   metoprolol succinate (TOPROL-XL) 100 MG 24 hr tablet Take 1 tablet (100 mg total) by mouth 2 (two) times daily. Take with or immediately following a meal. 180 tablet 3   ondansetron (ZOFRAN) 4 MG tablet Take 1 tablet (4 mg total) by mouth every 8 (eight) hours as needed for nausea or vomiting. 20 tablet 0   Semaglutide (RYBELSUS) 7 MG TABS Take 1 tablet (7 mg total) by mouth daily. 90 tablet 1   UNABLE TO FIND 1 each by Does not apply route daily. Med Name: Shower Chair 1 each 0   UNABLE TO FIND 1 each by Does not apply route daily. Med Name: Bed side Commode to fix over toilet seat 1 each 0   No current facility-administered medications for this visit.    ALLERGIES:  No Known Allergies  LABORATORY DATA:  I have reviewed the labs as listed.     Latest Ref Rng & Units 01/23/2023    3:21 PM 11/16/2022    5:00 AM 11/15/2022    4:58 AM  CBC  WBC 4.0 - 10.5 K/uL 10.6  6.2  6.9   Hemoglobin 12.0 - 15.0 g/dL 09.8  11.9  14.7   Hematocrit 36.0 - 46.0 % 36.2  30.9  30.5   Platelets 150 - 400 K/uL 211  162  153       Latest Ref Rng & Units 01/23/2023    3:21 PM 11/29/2022   10:01 AM 11/16/2022    5:00 AM  CMP  Glucose 70 - 99 mg/dL 829  562  130   BUN 8 - 23 mg/dL 16  20  19    Creatinine 0.44 - 1.00 mg/dL 8.65  7.84  6.96   Sodium 135 - 145 mmol/L 138  142  137   Potassium 3.5 - 5.1 mmol/L 3.6  4.1  3.5   Chloride 98 - 111 mmol/L 102  104  106   CO2 22 - 32 mmol/L 24  22  24    Calcium 8.7 - 10.3 mg/dL 8.9 - 29.5 mg/dL 9.9    9.6  9.8  8.8   Total Protein 6.5 - 8.1 g/dL 7.6     Total Bilirubin 0.3 - 1.2 mg/dL 0.8     Alkaline Phos 38 - 126 U/L 65     AST 15 - 41 U/L 36     ALT 0 - 44 U/L 24       DIAGNOSTIC IMAGING:  I have independently reviewed the scans and discussed with the patient. No results found.   WRAP UP:  All questions were answered. The patient knows  to call the clinic with any problems, questions or concerns.  Medical decision making: ***  Time spent on visit: I spent {CHL ONC TIME VISIT - [id number]} counseling the patient face to face. The total time spent in the appointment was {CHL ONC TIME VISIT - [id number]} and more than 50% was on counseling.  Carnella Guadalajara, PA-C  ***

## 2023-01-29 ENCOUNTER — Inpatient Hospital Stay: Payer: 59 | Admitting: Physician Assistant

## 2023-01-31 ENCOUNTER — Ambulatory Visit: Payer: 59 | Admitting: Internal Medicine

## 2023-02-01 ENCOUNTER — Ambulatory Visit: Payer: 59 | Admitting: Internal Medicine

## 2023-02-12 DIAGNOSIS — H2513 Age-related nuclear cataract, bilateral: Secondary | ICD-10-CM | POA: Diagnosis not present

## 2023-02-12 DIAGNOSIS — E119 Type 2 diabetes mellitus without complications: Secondary | ICD-10-CM | POA: Diagnosis not present

## 2023-02-12 LAB — HM DIABETES EYE EXAM

## 2023-02-14 DIAGNOSIS — M79671 Pain in right foot: Secondary | ICD-10-CM | POA: Diagnosis not present

## 2023-02-14 DIAGNOSIS — E1151 Type 2 diabetes mellitus with diabetic peripheral angiopathy without gangrene: Secondary | ICD-10-CM | POA: Diagnosis not present

## 2023-02-14 DIAGNOSIS — M79672 Pain in left foot: Secondary | ICD-10-CM | POA: Diagnosis not present

## 2023-02-14 DIAGNOSIS — M79675 Pain in left toe(s): Secondary | ICD-10-CM | POA: Diagnosis not present

## 2023-02-14 DIAGNOSIS — M79674 Pain in right toe(s): Secondary | ICD-10-CM | POA: Diagnosis not present

## 2023-02-14 DIAGNOSIS — E1140 Type 2 diabetes mellitus with diabetic neuropathy, unspecified: Secondary | ICD-10-CM | POA: Diagnosis not present

## 2023-02-17 NOTE — Progress Notes (Deleted)
[facility] [address], [place] [address]   CLINIC:  Medical Oncology/Hematology  PCP:  Anabel Halon, MD [address] / [place] [address] [phone]   REASON FOR VISIT:  Follow-up for history of stage IIIb poorly differentiated left breast cancer   PRIOR THERAPY: - Neoadjuvant dose dense AC x4 cycles from 07/18/2017 through 09/03/2017 - Paclitaxel weekly x12 cycles from 09/19/2017 through 12/06/2017 - Left modified radical mastectomy on 12/31/2017 - Herceptin x1 year, completed 03/04/2019   CURRENT THERAPY: Anastrozole (since 02/27/2018)  BRIEF ONCOLOGIC HISTORY:   Oncology History  Breast cancer of upper-outer quadrant of left female breast (HCC)  06/22/2017 Initial Diagnosis   Breast cancer of upper-outer quadrant of left female breast (HCC)   06/22/2017 Cancer Staging   Staging form: Breast, AJCC 8th Edition - Clinical stage from 06/22/2017: Stage IIIB (cT3, cN1, cM0, G3, ER+, PR-, HER2-) - Signed by Doreatha Massed, MD on 06/22/2017   06/29/2017 Imaging   CT chest showing left axillary adenopathy, 1.2 cm anterior carinal adenopathy, left subpectoral lymph node subcentimeter  06/25/2017 2D echocardiogram with ejection fraction of 55-60%   07/18/2017 - 12/06/2017 Chemotherapy   The patient had dexamethasone (DECADRON) 4 MG tablet, 1 of 1 cycle, Start date: --, End date: -- DOXOrubicin (ADRIAMYCIN) chemo injection 110 mg, 60 mg/m2 = 110 mg, Intravenous,  Once, 4 of 4 cycles Administration: 110 mg (07/18/2017), 110 mg (08/01/2017), 110 mg (08/20/2017), 110 mg (09/03/2017) palonosetron (ALOXI) injection 0.25 mg, 0.25 mg, Intravenous,  Once, 4 of 4 cycles Administration: 0.25 mg (07/18/2017), 0.25 mg (08/01/2017), 0.25 mg (08/20/2017), 0.25 mg (09/03/2017) pegfilgrastim-cbqv (UDENYCA) injection 6 mg, 6 mg, Subcutaneous, Once, 4 of 4 cycles Administration: 6 mg (07/19/2017), 6 mg (08/02/2017), 6 mg (08/22/2017), 6 mg (09/05/2017) cyclophosphamide (CYTOXAN) 1,100 mg  in sodium chloride 0.9 % 250 mL chemo infusion, 600 mg/m2 = 1,100 mg, Intravenous,  Once, 4 of 4 cycles Administration: 1,100 mg (07/18/2017), 1,100 mg (08/01/2017), 1,100 mg (08/20/2017), 1,100 mg (09/03/2017) PACLitaxel (TAXOL) 120 mg in sodium chloride 0.9 % 250 mL chemo infusion (</= 80mg /m2), 64 mg/m2 = 120 mg (80 % of original dose 80 mg/m2), Intravenous,  Once, 12 of 12 cycles Dose modification: 64 mg/m2 (80 % of original dose 80 mg/m2, Cycle 5, Reason: Provider Judgment), 60 mg/m2 (75 % of original dose 80 mg/m2, Cycle 15, Reason: Other (see comments)), 40 mg/m2 (50 % of original dose 80 mg/m2, Cycle 16, Reason: Other (see comments)) Administration: 120 mg (09/17/2017), 144 mg (09/24/2017), 144 mg (10/03/2017), 144 mg (10/11/2017), 144 mg (10/18/2017), 144 mg (10/25/2017), 144 mg (11/01/2017), 144 mg (11/08/2017), 144 mg (11/15/2017), 144 mg (11/22/2017), 108 mg (11/29/2017), 72 mg (12/06/2017) fosaprepitant (EMEND) 150 mg, dexamethasone (DECADRON) 12 mg in sodium chloride 0.9 % 145 mL IVPB, , Intravenous,  Once, 4 of 4 cycles Administration:  (07/18/2017),  (08/01/2017),  (08/20/2017),  (09/03/2017)  for chemotherapy treatment.    02/07/2018 - 03/04/2019 Chemotherapy   Patient is on Treatment Plan : BREAST Trastuzumab q21d      Radiation Therapy          CANCER STAGING:  Cancer Staging  Breast cancer of upper-outer quadrant of left female breast [facility]) Staging form: Breast, AJCC 8th Edition - Clinical stage from 06/22/2017: Stage IIIB (cT3, cN1, cM0, G3, ER+, PR-, HER2-) - Signed by Doreatha Massed, MD on 06/22/2017   INTERVAL HISTORY:   [name], a 67 y.o. female, returns for routine follow-up of her left-sided breast cancer.  Lisa Thornton was last seen on 12/27/2021 by Rojelio Brenner PA-C.  In the interim since her last visit, she has been hospitalized twice: 07/03/2022 through 07/10/2022: Hypoxic respiratory failure secondary to pulmonary edema and pneumonia with severe sepsis, bacteremia, UTI, heart  failure, and A-fib with RVR. 11/13/2022 through 11/16/2022: A-fib with RVR in the setting of sepsis due to Klebsiella UTI and bacteremia.  At today's visit, she  reports feeling ***.  She denies any recent hospitalizations, surgeries, or changes in her  baseline health status.  ***She denies any symptoms of recurrence such as new lumps, bone pain, chest pain, dyspnea, or abdominal pain.  She has no new headaches, seizures, or focal neurologic deficits.  No B symptoms such as fever, chills, night sweats, unintentional weight loss.   ***She continues to take Arimidex.  She reports mild hair loss and intermittent leg cramps at night.  *** ***No hot flashes or mood swings.   ***She takes 1000 units vitamin D twice daily and 600 mg calcium daily.    She reports ***% energy and ***% appetite.  She is maintaining stable weight at this time.   ASSESSMENT & PLAN:  1.  Poorly differentiated left-sided breast cancer, stage IIIb (s/p LEFT mastectomy) - On 06/12/2017 she had a left breast biopsy with sarcomatoid features, left axillary lymph node biopsy was negative. - Her 2D echo showed EF 55 to 60% - CT of the chest showed subcarinal adenopathy. - PET CT scan was negative for metastatic disease. -She had 4 cycles of neoadjuvant dose dense AC from 07/18/2017 through 09/03/2017. - She had 12 cycles of weekly paclitaxel from 09/17/2017 through 12/06/2017. -She underwent a left modified radical mastectomy on 12/31/2017. - The pathology revealed a 2.3 cm poorly differentiated invasive ductal carcinoma with sarcomatoid changes, resection margins negative, high-grade DCIS, no skin involvement, 0 out of 8 lymph nodes positive, negative for lymphovascular or perineural invasion.  ER positive, PR and HER-2 negative.  Pathological staging is [id number]. - Her receptors were rechecked on a mastectomy specimen and HER-2 was positive by IHC.  This was initially negative by FISH on biopsy specimen.  ER was negative on mastectomy  specimen.  It was 60% positive on biopsy specimen.  Ki 67 has decreased from 30% to 80% on biopsy.  This was reviewed by a second pathologist and confirmed. - It was recommended she have 1 year of Herceptin. Pertuzumab was not added secondary to lymph node negativity.  - 1 year of Herceptin completed on 03/04/2019. - Anastrozole started on 02/27/2018.*** Goal of treatment is 10 years.  *** - BCI testing (01/10/2022) showed statistically significant benefit from extended endocrine therapy.  (10.9% risk of late distant recurrence after 5 years of adjuvant endocrine therapy, compared to 3.6-4.6% risk of late distant recurrence after 10 years of adjuvant endocrine therapy). - Mammogram on 06/26/2022 of the right breast is BI-RADS Category 1, negative - *** Physical exam today: No discrete nodules or masses in right breast.  Left mastectomy site within normal limits.  No axillary, supraclavicular, pectoral, or epitrochlear lymphadenopathy - History/ROS ***. - Most recent labs (01/23/2023): Normal LFTs.  Kidney function at baseline (creatinine 1.14/GFR 52).  CBC unremarkable.  - No red flag symptoms of recurrence per ROS.*** - PLAN: Continue anastrozole daily.*** - Repeat labs and RTC in 6 months.  *** - Next mammogram due in March 2025***  2.  Osteopenia - DEXA (03/07/2018) showed T score -1.8 - DEXA (06/18/2020) showed T score -1.8, osteopenic - Most recent DEXA (06/26/2022): T-score -  1.9, osteopenic - She takes vitamin D 1000 units twice daily and calcium 600 mg daily  - Labs (01/23/2023) normal vitamin D 49.97, calcium 9.6 - PLAN:  Continue to recommend calcium, vitamin D, and weightbearing exercises for breast cancer therapy associated bone loss - Next DEXA due March 2026 ***   3.  Mild hypercalcemia - She had mildly elevated calcium level ranging from 10.4-11 since August 2022.  This could be secondary to vitamin D intake.  - Previous workup (12/19/2021): Calcium 10.0 per CMP.  Vitamin D 51.57.   Normal PTH 29, normal calcium 10.1 per intact PTH/calcium. - Most recent calcium 9.6 (NORMAL), with normal PTH (16) - PLAN: No further workup at this time, will continue periodic monitoring of calcium.  4.  Other history - Other PMH: Atrial fibrillation (Eliquis), hypertension, diabetes, sleep apnea, diabetes  PLAN SUMMARY: >> Mammogram due around 06/27/2023 *** >> Labs in 6 months = CBC/D, CMP, vitamin D *** >> OFFICE visit in 6 months (1 week after labs)    REVIEW OF SYSTEMS: ***  Review of Systems - Oncology  PHYSICAL EXAM:   Performance status (ECOG): {CHL ONC BJ:[id number]} *** There were no vitals filed for this visit. Wt Readings from Last 3 Encounters:  01/22/23 151 lb 9.6 oz (68.8 kg)  11/29/22 143 lb (64.9 kg)  11/28/22 147 lb (66.7 kg)   Physical Exam Constitutional:      Appearance: Normal appearance.  HENT:     Head: Normocephalic and atraumatic.     Mouth/Throat:     Mouth: Mucous membranes are moist.  Eyes:     Extraocular Movements: Extraocular movements intact.     Pupils: Pupils are equal, round, and reactive to light.  Cardiovascular:     Rate and Rhythm: Normal rate and regular rhythm.     Pulses: Normal pulses.     Heart sounds: Normal heart sounds.  Pulmonary:     Effort: Pulmonary effort is normal.     Breath sounds: Normal breath sounds.  Chest:       Comments: No discrete nodules or masses in right breast.  Left mastectomy site within normal limits.  No axillary, supraclavicular, pectoral, or epitrochlear lymphadenopathy. Abdominal:     General: Abdomen is protuberant. Bowel sounds are normal. There is distension.     Palpations: Abdomen is soft.     Tenderness: There is no abdominal tenderness.  Musculoskeletal:        General: No swelling.     Right lower leg: No edema.     Left lower leg: No edema.  Lymphadenopathy:     Cervical: No cervical adenopathy.  Skin:    General: Skin is warm and dry.  Neurological:     General: No focal  deficit present.     Mental Status: She is alert and oriented to person, place, and time.  Psychiatric:        Mood and Affect: Mood normal.        Behavior: Behavior normal.      PAST MEDICAL/SURGICAL HISTORY:  Past Medical History:  Diagnosis Date   Anemia 12/31/2017   Cancer (HCC)    left breast cancer   Chest pain    Diabetes mellitus without complication (HCC)    Hypertension    Past Surgical History:  Procedure Laterality Date   COLONOSCOPY WITH PROPOFOL N/A 09/27/2021   Procedure: COLONOSCOPY WITH PROPOFOL;  Surgeon: Dolores Frame, MD;  Location: [facility];  Service: Gastroenterology;  Laterality: N/A;  945  MASTECTOMY MODIFIED RADICAL Left 12/31/2017   Procedure: LEFT MODIFIED RADICAL MASTECTOMY;  Surgeon: Franky Macho, MD;  Location: [facility];  Service: General;  Laterality: Left;   POLYPECTOMY  09/27/2021   Procedure: POLYPECTOMY;  Surgeon: Dolores Frame, MD;  Location: [facility];  Service: Gastroenterology;;   PORTACATH PLACEMENT Right 06/27/2017   Procedure: INSERTION PORT-A-CATH;  Surgeon: Franky Macho, MD;  Location: [facility];  Service: General;  Laterality: Right;    SOCIAL HISTORY:  Social History   Socioeconomic History   Marital status: Divorced    Spouse name: Not on file   Number of children: 2   Years of education: 10   Highest education level: 10th grade  Occupational History   Occupation: Psychologist, 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  Tobacco Use   Smoking status: Never   Smokeless tobacco: Never  Vaping Use   Vaping status: Never Used  Substance and Sexual Activity   Alcohol use: No   Drug use: No   Sexual activity: Not Currently  Other Topics Concern   Not on file  Social History Narrative   Lives with sister   2 children   Dog: Cozie      Enjoys: puzzles, sewing, and walk      Diet: eats all food groups outside: leafy greens    Caffeine: limited, sweet tea   Water: 6-8 cups daily       No car; does have license, wears seat belt   Administrator, 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   [place]    Social Determinants of Health   Financial Resource Strain: Low Risk  (03/29/2021)   Overall Financial Resource Strain (CARDIA)    Difficulty of Paying Living Expenses: Not hard at all  Food Insecurity: No Food Insecurity (11/20/2022)   Hunger Vital Sign    Worried About Running Out of Food in the Last Year: Never true    Ran Out of Food in the Last Year: Never true  Transportation Needs: No Transportation Needs (11/20/2022)   PRAPARE - Administrator, Civil Service (Medical): No    Lack of Transportation (Non-Medical): No  Physical Activity: Insufficiently Active (03/29/2021)   Exercise Vital Sign    Days of Exercise per Week: 7 days    Minutes of Exercise per Session: 20 min  Stress: No Stress Concern Present (03/29/2021)   [employer] of Occupational Health - Occupational Stress Questionnaire    Feeling of Stress : Not at all  Social Connections: Socially Isolated (03/29/2021)   Social Connection and Isolation Panel [NHANES]    Frequency of Communication with Friends and Family: More than three times a week    Frequency of Social Gatherings with Friends and Family: Once a week    Attends Religious Services: Never    Database administrator or Organizations: No    Attends Banker Meetings: Never    Marital Status: Divorced  Catering manager Violence: Not At Risk (11/13/2022)   Humiliation, Afraid, Rape, and Kick questionnaire    Fear of Current or Ex-Partner: No    Emotionally Abused: No    Physically Abused: No    Sexually Abused: No    FAMILY HISTORY:  Family History  Problem Relation Age of Onset   Breast cancer Mother    Thyroid disease Mother    Heart disease Mother    Heart attack Father    Heart attack Sister    Hypertension Sister    Heart attack Brother    Cancer Sister  Stroke Brother    Alzheimer's disease Maternal Aunt     CURRENT MEDICATIONS:  Current Outpatient Medications   Medication Sig Dispense Refill   Accu-Chek Softclix Lancets lancets USE 1  TO CHECK GLUCOSE UP TO 4 TIMES DAILY 100 each 0   anastrozole (ARIMIDEX) 1 MG tablet Take 1 tablet by mouth once daily 90 tablet 0   apixaban (ELIQUIS) 5 MG TABS tablet Take 1 tablet (5 mg total) by mouth 2 (two) times daily. 180 tablet 3   atorvastatin (LIPITOR) 10 MG tablet Take 1 tablet (10 mg total) by mouth at bedtime. 90 tablet 0   blood glucose meter kit and supplies Dispense based on patient and insurance preference. Use up to four times daily as directed. (FOR ICD-10 E10.9, E11.9). 1 each 0   calcium carbonate (OS-CAL) 600 MG TABS tablet Take 600 mg by mouth daily with breakfast.     cholecalciferol (VITAMIN D3) 25 MCG (1000 UT) tablet Take 1,000 Units by mouth daily.     empagliflozin (JARDIANCE) 10 MG TABS tablet Take 1 tablet (10 mg total) by mouth daily. 90 tablet 3   furosemide (LASIX) 20 MG tablet Take 1 tablet (20 mg total) by mouth daily as needed for fluid or edema. 90 tablet 1   glucose blood (ACCU-CHEK GUIDE) test strip USE 1 STRIP TO CHECK GLUCOSE THREE TIMES DAILY MORNING,  NOON  AND  AT  BEDTIME  AS  DIRECTED 100 each 0   losartan (COZAAR) 25 MG tablet Take 0.5 tablets (12.5 mg total) by mouth daily. 45 tablet 3   magnesium oxide (MAG-OX) 400 (240 Mg) MG tablet Take 1 tablet (400 mg total) by mouth daily.     metFORMIN (GLUCOPHAGE) 1000 MG tablet Take 1 tablet (1,000 mg total) by mouth daily with breakfast. 90 tablet 1   metoprolol succinate (TOPROL-XL) 100 MG 24 hr tablet Take 1 tablet (100 mg total) by mouth 2 (two) times daily. Take with or immediately following a meal. 180 tablet 3   ondansetron (ZOFRAN) 4 MG tablet Take 1 tablet (4 mg total) by mouth every 8 (eight) hours as needed for nausea or vomiting. 20 tablet 0   Semaglutide (RYBELSUS) 7 MG TABS Take 1 tablet (7 mg total) by mouth daily. 90 tablet 1   UNABLE TO FIND 1 each by Does not apply route daily. Med Name: Shower Chair 1 each 0    UNABLE TO FIND 1 each by Does not apply route daily. Med Name: Bed side Commode to fix over toilet seat 1 each 0   No current facility-administered medications for this visit.    ALLERGIES:  No Known Allergies  LABORATORY DATA:  I have reviewed the labs as listed.     Latest Ref Rng & Units 01/23/2023    3:21 PM 11/16/2022    5:00 AM 11/15/2022    4:58 AM  CBC  WBC 4.0 - 10.5 K/uL 10.6  6.2  6.9   Hemoglobin 12.0 - 15.0 g/dL 40.9  81.1  91.4   Hematocrit 36.0 - 46.0 % 36.2  30.9  30.5   Platelets 150 - 400 K/uL 211  162  153       Latest Ref Rng & Units 01/23/2023    3:21 PM 11/29/2022   10:01 AM 11/16/2022    5:00 AM  CMP  Glucose 70 - 99 mg/dL 782  956  213   BUN 8 - 23 mg/dL 16  20  19    Creatinine 0.44 - 1.00  mg/dL 4.09  8.11  9.14   Sodium 135 - 145 mmol/L 138  142  137   Potassium 3.5 - 5.1 mmol/L 3.6  4.1  3.5   Chloride 98 - 111 mmol/L 102  104  106   CO2 22 - 32 mmol/L 24  22  24    Calcium 8.7 - 10.3 mg/dL 8.9 - 78.2 mg/dL 9.9    9.6  9.8  8.8   Total Protein 6.5 - 8.1 g/dL 7.6     Total Bilirubin 0.3 - 1.2 mg/dL 0.8     Alkaline Phos 38 - 126 U/L 65     AST 15 - 41 U/L 36     ALT 0 - 44 U/L 24       DIAGNOSTIC IMAGING:  I have independently reviewed the scans and discussed with the patient. No results found.   WRAP UP:  All questions were answered. The patient knows to call the clinic with any problems, questions or concerns.  Medical decision making: ***  Time spent on visit: I spent {CHL ONC TIME VISIT - NFAOZ:[id number]} counseling the patient face to face. The total time spent in the appointment was {CHL ONC TIME VISIT - GEXBM:[id number]} and more than 50% was on counseling.  Carnella Guadalajara, PA-C  ***

## 2023-02-19 ENCOUNTER — Inpatient Hospital Stay: Payer: 59 | Admitting: Physician Assistant

## 2023-02-26 ENCOUNTER — Ambulatory Visit: Payer: 59 | Admitting: Internal Medicine

## 2023-02-27 DIAGNOSIS — G4733 Obstructive sleep apnea (adult) (pediatric): Secondary | ICD-10-CM | POA: Diagnosis not present

## 2023-03-19 ENCOUNTER — Encounter: Payer: Self-pay | Admitting: Internal Medicine

## 2023-03-19 NOTE — Telephone Encounter (Signed)
 Care team updated and letter sent for eye exam notes.

## 2023-03-24 ENCOUNTER — Other Ambulatory Visit: Payer: Self-pay | Admitting: Internal Medicine

## 2023-03-24 DIAGNOSIS — Z170 Estrogen receptor positive status [ER+]: Secondary | ICD-10-CM

## 2023-03-26 ENCOUNTER — Inpatient Hospital Stay: Payer: 59 | Attending: Oncology | Admitting: Oncology

## 2023-03-26 VITALS — BP 138/80 | HR 82 | Temp 97.7°F | Resp 18 | Wt 144.6 lb

## 2023-03-26 DIAGNOSIS — Z170 Estrogen receptor positive status [ER+]: Secondary | ICD-10-CM | POA: Diagnosis not present

## 2023-03-26 DIAGNOSIS — Z7901 Long term (current) use of anticoagulants: Secondary | ICD-10-CM | POA: Diagnosis not present

## 2023-03-26 DIAGNOSIS — M8580 Other specified disorders of bone density and structure, unspecified site: Secondary | ICD-10-CM | POA: Insufficient documentation

## 2023-03-26 DIAGNOSIS — Z9012 Acquired absence of left breast and nipple: Secondary | ICD-10-CM | POA: Diagnosis not present

## 2023-03-26 DIAGNOSIS — Z79811 Long term (current) use of aromatase inhibitors: Secondary | ICD-10-CM | POA: Insufficient documentation

## 2023-03-26 DIAGNOSIS — I480 Paroxysmal atrial fibrillation: Secondary | ICD-10-CM | POA: Insufficient documentation

## 2023-03-26 DIAGNOSIS — C50412 Malignant neoplasm of upper-outer quadrant of left female breast: Secondary | ICD-10-CM | POA: Diagnosis not present

## 2023-03-26 NOTE — Progress Notes (Unsigned)
[facility] [address], [place] [address]   CLINIC:  Medical Oncology/Hematology  PCP:  Anabel Halon, MD [address] / [place] [address] [phone]   REASON FOR VISIT:  Follow-up for history of stage IIIb poorly differentiated left breast cancer  PRIOR THERAPY: - Neoadjuvant dose dense AC x4 cycles from 07/18/2017 through 09/03/2017 - Paclitaxel weekly x12 cycles from 09/19/2017 through 12/06/2017 - Left modified radical mastectomy on 12/31/2017 - Herceptin x1 year, completed 03/04/2019  CURRENT THERAPY: Anastrozole (since 02/27/2018)  BRIEF ONCOLOGIC HISTORY:  Oncology History  Breast cancer of upper-outer quadrant of left female breast (HCC)  06/22/2017 Initial Diagnosis   Breast cancer of upper-outer quadrant of left female breast (HCC)   06/22/2017 Cancer Staging   Staging form: Breast, AJCC 8th Edition - Clinical stage from 06/22/2017: Stage IIIB (cT3, cN1, cM0, G3, ER+, PR-, HER2-) - Signed by Doreatha Massed, MD on 06/22/2017   06/29/2017 Imaging   CT chest showing left axillary adenopathy, 1.2 cm anterior carinal adenopathy, left subpectoral lymph node subcentimeter  06/25/2017 2D echocardiogram with ejection fraction of 55-60%   07/18/2017 - 12/06/2017 Chemotherapy   The patient had dexamethasone (DECADRON) 4 MG tablet, 1 of 1 cycle, Start date: --, End date: -- DOXOrubicin (ADRIAMYCIN) chemo injection 110 mg, 60 mg/m2 = 110 mg, Intravenous,  Once, 4 of 4 cycles Administration: 110 mg (07/18/2017), 110 mg (08/01/2017), 110 mg (08/20/2017), 110 mg (09/03/2017) palonosetron (ALOXI) injection 0.25 mg, 0.25 mg, Intravenous,  Once, 4 of 4 cycles Administration: 0.25 mg (07/18/2017), 0.25 mg (08/01/2017), 0.25 mg (08/20/2017), 0.25 mg (09/03/2017) pegfilgrastim-cbqv (UDENYCA) injection 6 mg, 6 mg, Subcutaneous, Once, 4 of 4 cycles Administration: 6 mg (07/19/2017), 6 mg (08/02/2017), 6 mg (08/22/2017), 6 mg (09/05/2017) cyclophosphamide (CYTOXAN) 1,100 mg in  sodium chloride 0.9 % 250 mL chemo infusion, 600 mg/m2 = 1,100 mg, Intravenous,  Once, 4 of 4 cycles Administration: 1,100 mg (07/18/2017), 1,100 mg (08/01/2017), 1,100 mg (08/20/2017), 1,100 mg (09/03/2017) PACLitaxel (TAXOL) 120 mg in sodium chloride 0.9 % 250 mL chemo infusion (</= 80mg /m2), 64 mg/m2 = 120 mg (80 % of original dose 80 mg/m2), Intravenous,  Once, 12 of 12 cycles Dose modification: 64 mg/m2 (80 % of original dose 80 mg/m2, Cycle 5, Reason: Provider Judgment), 60 mg/m2 (75 % of original dose 80 mg/m2, Cycle 15, Reason: Other (see comments)), 40 mg/m2 (50 % of original dose 80 mg/m2, Cycle 16, Reason: Other (see comments)) Administration: 120 mg (09/17/2017), 144 mg (09/24/2017), 144 mg (10/03/2017), 144 mg (10/11/2017), 144 mg (10/18/2017), 144 mg (10/25/2017), 144 mg (11/01/2017), 144 mg (11/08/2017), 144 mg (11/15/2017), 144 mg (11/22/2017), 108 mg (11/29/2017), 72 mg (12/06/2017) fosaprepitant (EMEND) 150 mg, dexamethasone (DECADRON) 12 mg in sodium chloride 0.9 % 145 mL IVPB, , Intravenous,  Once, 4 of 4 cycles Administration:  (07/18/2017),  (08/01/2017),  (08/20/2017),  (09/03/2017)  for chemotherapy treatment.    02/07/2018 - 03/04/2019 Chemotherapy   Patient is on Treatment Plan : BREAST Trastuzumab q21d      Radiation Therapy       CANCER STAGING:  Cancer Staging  Breast cancer of upper-outer quadrant of left female breast [facility]) Staging form: Breast, AJCC 8th Edition - Clinical stage from 06/22/2017: Stage IIIB (cT3, cN1, cM0, G3, ER+, PR-, HER2-) - Signed by Doreatha Massed, MD on 06/22/2017   INTERVAL HISTORY:  [name], a 67 y.o. female, returns for routine follow-up of her history of left-sided breast cancer. Karlissa was last seen on  12/27/2021 by Rojelio Brenner, PA.  Patient was hospitalized on 07/03/2022-07/10/2022 and again on 11/13/2022 -11/16/2022 for atrial fibrillation with RVR.  She was treated with IV Cardizem drip then transition to oral metoprolol.  She continues Xarelto 5  mg twice daily for full anticoagulation.  At today's visit, she reports doing well.  Denies any bleeding, melena or hematochezia.  She denies any symptoms of recurrence such as new lumps, bone pain, chest pain, dyspnea, or abdominal pain.  She has no new headaches, seizures, or focal neurologic deficits.  No B symptoms such as fever, chills, night sweats, unintentional weight loss.  She continues to take Arimidex.  She reports mild hair loss and intermittent leg cramps at night.  No hot flashes or mood swings.  She takes 1000 units vitamin D twice daily and 600 mg calcium daily.    She reports 80% energy and 100% appetite.  She is maintaining stable weight at this time.   REVIEW OF SYSTEMS:  Review of Systems  Constitutional:  Negative for appetite change, chills, diaphoresis, fatigue, fever and unexpected weight change.  HENT:   Negative for lump/mass and nosebleeds.   Eyes:  Negative for eye problems.  Respiratory:  Negative for cough, hemoptysis and shortness of breath.   Cardiovascular:  Negative for chest pain, leg swelling and palpitations.  Gastrointestinal:  Positive for nausea. Negative for abdominal pain, blood in stool, constipation, diarrhea and vomiting.  Genitourinary:  Positive for dysuria. Negative for hematuria.   Skin: Negative.   Neurological:  Negative for dizziness, headaches and light-headedness.  Hematological:  Does not bruise/bleed easily.    PAST MEDICAL/SURGICAL HISTORY:  Past Medical History:  Diagnosis Date   Anemia 12/31/2017   Cancer (HCC)    left breast cancer   Chest pain    Diabetes mellitus without complication (HCC)    Hypertension    Past Surgical History:  Procedure Laterality Date   COLONOSCOPY WITH PROPOFOL N/A 09/27/2021   Procedure: COLONOSCOPY WITH PROPOFOL;  Surgeon: Dolores Frame, MD;  Location: [facility];  Service: Gastroenterology;  Laterality: N/A;  945   MASTECTOMY MODIFIED RADICAL Left 12/31/2017   Procedure: LEFT  MODIFIED RADICAL MASTECTOMY;  Surgeon: Franky Macho, MD;  Location: [facility];  Service: General;  Laterality: Left;   POLYPECTOMY  09/27/2021   Procedure: POLYPECTOMY;  Surgeon: Dolores Frame, MD;  Location: [facility];  Service: Gastroenterology;;   PORTACATH PLACEMENT Right 06/27/2017   Procedure: INSERTION PORT-A-CATH;  Surgeon: Franky Macho, MD;  Location: [facility];  Service: General;  Laterality: Right;    SOCIAL HISTORY:  Social History   Socioeconomic History   Marital status: Divorced    Spouse name: Not on file   Number of children: 2   Years of education: 10   Highest education level: 10th grade  Occupational History   Occupation: Psychologist, 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  Tobacco Use   Smoking status: Never   Smokeless tobacco: Never  Vaping Use   Vaping status: Never Used  Substance and Sexual Activity   Alcohol use: No   Drug use: No   Sexual activity: Not Currently  Other Topics Concern   Not on file  Social History Narrative   Lives with sister   2 children   Dog: Cozie      Enjoys: puzzles, sewing, and walk      Diet: eats all food groups outside: leafy greens    Caffeine: limited, sweet tea   Water: 6-8 cups daily  No car; does have license, wears seat belt   Smoke detector at home   [place] area    Social Drivers of Health   Financial Resource Strain: Low Risk  (03/29/2021)   Overall Financial Resource Strain (CARDIA)    Difficulty of Paying Living Expenses: Not hard at all  Food Insecurity: No Food Insecurity (11/20/2022)   Hunger Vital Sign    Worried About Running Out of Food in the Last Year: Never true    Ran Out of Food in the Last Year: Never true  Transportation Needs: No Transportation Needs (11/20/2022)   PRAPARE - Administrator, Civil Service (Medical): No    Lack of Transportation (Non-Medical): No  Physical Activity: Insufficiently Active (03/29/2021)   Exercise Vital Sign    Days of Exercise per Week: 7 days    Minutes of  Exercise per Session: 20 min  Stress: No Stress Concern Present (03/29/2021)   [employer] of Occupational Health - Occupational Stress Questionnaire    Feeling of Stress : Not at all  Social Connections: Socially Isolated (03/29/2021)   Social Connection and Isolation Panel [NHANES]    Frequency of Communication with Friends and Family: More than three times a week    Frequency of Social Gatherings with Friends and Family: Once a week    Attends Religious Services: Never    Database administrator or Organizations: No    Attends Banker Meetings: Never    Marital Status: Divorced  Catering manager Violence: Not At Risk (11/13/2022)   Humiliation, Afraid, Rape, and Kick questionnaire    Fear of Current or Ex-Partner: No    Emotionally Abused: No    Physically Abused: No    Sexually Abused: No    FAMILY HISTORY:  Family History  Problem Relation Age of Onset   Breast cancer Mother    Thyroid disease Mother    Heart disease Mother    Heart attack Father    Heart attack Sister    Hypertension Sister    Heart attack Brother    Cancer Sister    Stroke Brother    Alzheimer's disease Maternal Aunt     CURRENT MEDICATIONS:  Current Outpatient Medications  Medication Sig Dispense Refill   Accu-Chek Softclix Lancets lancets USE 1  TO CHECK GLUCOSE UP TO 4 TIMES DAILY 100 each 0   anastrozole (ARIMIDEX) 1 MG tablet Take 1 tablet by mouth once daily 90 tablet 0   apixaban (ELIQUIS) 5 MG TABS tablet Take 1 tablet (5 mg total) by mouth 2 (two) times daily. 180 tablet 3   atorvastatin (LIPITOR) 10 MG tablet Take 1 tablet (10 mg total) by mouth at bedtime. 90 tablet 0   blood glucose meter kit and supplies Dispense based on patient and insurance preference. Use up to four times daily as directed. (FOR ICD-10 E10.9, E11.9). 1 each 0   calcium carbonate (OS-CAL) 600 MG TABS tablet Take 600 mg by mouth daily with breakfast.     cholecalciferol (VITAMIN D3) 25 MCG (1000 UT)  tablet Take 1,000 Units by mouth daily.     empagliflozin (JARDIANCE) 10 MG TABS tablet Take 1 tablet (10 mg total) by mouth daily. 90 tablet 3   furosemide (LASIX) 20 MG tablet Take 1 tablet (20 mg total) by mouth daily as needed for fluid or edema. 90 tablet 1   glucose blood (ACCU-CHEK GUIDE) test strip USE 1 STRIP TO CHECK GLUCOSE THREE TIMES DAILY MORNING,  NOON  AND  AT  BEDTIME  AS  DIRECTED 100 each 0   losartan (COZAAR) 25 MG tablet Take 0.5 tablets (12.5 mg total) by mouth daily. 45 tablet 3   magnesium oxide (MAG-OX) 400 (240 Mg) MG tablet Take 1 tablet (400 mg total) by mouth daily.     metFORMIN (GLUCOPHAGE) 1000 MG tablet Take 1 tablet (1,000 mg total) by mouth daily with breakfast. 90 tablet 1   metoprolol succinate (TOPROL-XL) 100 MG 24 hr tablet Take 1 tablet (100 mg total) by mouth 2 (two) times daily. Take with or immediately following a meal. 180 tablet 3   ondansetron (ZOFRAN) 4 MG tablet Take 1 tablet (4 mg total) by mouth every 8 (eight) hours as needed for nausea or vomiting. 20 tablet 0   Semaglutide (RYBELSUS) 7 MG TABS Take 1 tablet (7 mg total) by mouth daily. 90 tablet 1   UNABLE TO FIND 1 each by Does not apply route daily. Med Name: Shower Chair 1 each 0   UNABLE TO FIND 1 each by Does not apply route daily. Med Name: Bed side Commode to fix over toilet seat 1 each 0   No current facility-administered medications for this visit.    ALLERGIES:  No Known Allergies  PHYSICAL EXAM:  Performance status (ECOG): 1 - Symptomatic but completely ambulatory  There were no vitals filed for this visit. Wt Readings from Last 3 Encounters:  01/22/23 151 lb 9.6 oz (68.8 kg)  11/29/22 143 lb (64.9 kg)  11/28/22 147 lb (66.7 kg)   Physical Exam Constitutional:      Appearance: Normal appearance.  HENT:     Head: Normocephalic and atraumatic.     Mouth/Throat:     Mouth: Mucous membranes are moist.  Eyes:     Extraocular Movements: Extraocular movements intact.      Pupils: Pupils are equal, round, and reactive to light.  Cardiovascular:     Rate and Rhythm: Normal rate and regular rhythm.     Pulses: Normal pulses.     Heart sounds: Normal heart sounds.  Pulmonary:     Effort: Pulmonary effort is normal.     Breath sounds: Normal breath sounds.  Chest:       Comments: No discrete nodules or masses in right breast.  Left mastectomy site within normal limits.  No axillary, supraclavicular, pectoral, or epitrochlear lymphadenopathy. Abdominal:     General: Abdomen is protuberant. Bowel sounds are normal. There is distension.     Palpations: Abdomen is soft.     Tenderness: There is no abdominal tenderness.  Musculoskeletal:        General: No swelling.     Right lower leg: No edema.     Left lower leg: No edema.  Lymphadenopathy:     Cervical: No cervical adenopathy.  Skin:    General: Skin is warm and dry.  Neurological:     General: No focal deficit present.     Mental Status: She is alert and oriented to person, place, and time.  Psychiatric:        Mood and Affect: Mood normal.        Behavior: Behavior normal.      LABORATORY DATA:  I have reviewed the labs as listed.     Latest Ref Rng & Units 01/23/2023    3:21 PM 11/16/2022    5:00 AM 11/15/2022    4:58 AM  CBC  WBC 4.0 - 10.5 K/uL 10.6  6.2  6.9  Hemoglobin 12.0 - 15.0 g/dL 74.2  59.5  63.8   Hematocrit 36.0 - 46.0 % 36.2  30.9  30.5   Platelets 150 - 400 K/uL 211  162  153       Latest Ref Rng & Units 01/23/2023    3:21 PM 11/29/2022   10:01 AM 11/16/2022    5:00 AM  CMP  Glucose 70 - 99 mg/dL 756  433  295   BUN 8 - 23 mg/dL 16  20  19    Creatinine 0.44 - 1.00 mg/dL 1.88  4.16  6.06   Sodium 135 - 145 mmol/L 138  142  137   Potassium 3.5 - 5.1 mmol/L 3.6  4.1  3.5   Chloride 98 - 111 mmol/L 102  104  106   CO2 22 - 32 mmol/L 24  22  24    Calcium 8.7 - 10.3 mg/dL 8.9 - 30.1 mg/dL 9.9    9.6  9.8  8.8   Total Protein 6.5 - 8.1 g/dL 7.6     Total Bilirubin 0.3 -  1.2 mg/dL 0.8     Alkaline Phos 38 - 126 U/L 65     AST 15 - 41 U/L 36     ALT 0 - 44 U/L 24       DIAGNOSTIC IMAGING:  I have independently reviewed the scans and discussed with the patient. No results found.   ASSESSMENT & PLAN: 1.  Poorly differentiated left-sided breast cancer, stage IIIb - On 06/12/2017 she had a left breast biopsy with sarcomatoid features, left axillary lymph node biopsy was negative. - Her 2D echo showed EF 55 to 60% - CT of the chest showed subcarinal adenopathy. - PET CT scan was negative for metastatic disease. -She had 4 cycles of neoadjuvant dose dense AC from 07/18/2017 through 09/03/2017. - She had 12 cycles of weekly paclitaxel from 09/17/2017 through 12/06/2017. -She underwent a left modified radical mastectomy on 12/31/2017. - The pathology revealed a 2.3 cm poorly differentiated invasive ductal carcinoma with sarcomatoid changes, resection margins negative, high-grade DCIS, no skin involvement, 0 out of 8 lymph nodes positive, negative for lymphovascular or perineural invasion.  ER positive, PR and HER-2 negative.  Pathological staging is [id number]. - Her receptors were rechecked on a mastectomy specimen and HER-2 was positive by IHC.  This was initially negative by FISH on biopsy specimen.  ER was negative on mastectomy specimen.  It was 60% positive on biopsy specimen.  Ki 67 has decreased from 30% to 80% on biopsy.  This was reviewed by a second pathologist and confirmed. - It was recommended she have 1 year of Herceptin. Pertuzumab was not added secondary to lymph node negativity.  - 1 year of Herceptin completed on 03/04/2019. - Anastrozole started on 02/27/2018.  2.  Osteopenia - DEXA (03/07/2018) showed T score -1.8 - DEXA (06/18/2020) showed T score -1.8, osteopenic  3.  Mild hypercalcemia - She had mildly elevated calcium level ranging from 10.4-11 since August 2022.  This could be secondary to vitamin D intake.  - Most recent labs (12/19/2021): Calcium  10.0 per CMP.  Vitamin D 51.57.  Normal PTH 29, normal calcium 10.1 per intact PTH/calcium.   PLAN: 1. Malignant neoplasm of upper-outer quadrant of left breast in female, estrogen receptor positive (HCC) (Primary) -Mammogram from 06/26/2022 was read as BI-RADS Category 1 negative.  Repeat in 1 year. -Physical exam today: No discrete nodules or masses in right breast.  Left mastectomy site within normal limits.  No axillary, supraclavicular, pectoral, or epitrochlear lymphadenopathy. - Most recent labs (01/23/2023):  LFTs normal.  CMP otherwise unremarkable.  CBC grossly unremarkable. -No red flag symptoms of recurrence per ROS. -Continue anastrozole daily.  -Discussed BCI testing which showed the benefit of 5 years of aromatase inhibitors and with extended therapy typically 10 years of treatment.  Risk of after 5 years was 10.9% and reduced down to 3.8% with extended endocrine therapy. -Repeat labs and RTC in 6 months.   -Next mammogram due around 06/16/2023. -We will obtain BCI testing to determine length of aromatase inhibitor treatment.  2. Osteopenia due to cancer therapy -She takes vitamin D 1000 units twice daily and calcium 600 mg daily  -Labs (01/23/2023) showed normal vitamin D 49.97, calcium 9.6 -Most recent bone density from 06/26/2022 reveals a T-score of -1.9. -Continue to recommend calcium, vitamin D, and weightbearing exercises for breast cancer therapy associated bone loss  3. Paroxysmal atrial fibrillation (HCC) ***   PLAN SUMMARY: >> *** >> *** >> ***      PLAN SUMMARY & DISPOSITION: Mammogram + DEXA/bone density around 06/20/2022 Labs in March 2024 Office visit 1 week after imaging studies and labs  All questions were answered. The patient knows to call the clinic with any problems, questions or concerns.  Medical decision making: Moderate  Time spent on visit: I spent 20 minutes counseling the patient face to face. The total time spent in the appointment was 30  minutes and more than 50% was on counseling.   Mauro Kaufmann, NP  03/26/23 7:16 AM

## 2023-03-29 DIAGNOSIS — G4733 Obstructive sleep apnea (adult) (pediatric): Secondary | ICD-10-CM | POA: Diagnosis not present

## 2023-04-27 ENCOUNTER — Encounter: Payer: Self-pay | Admitting: Student

## 2023-04-27 ENCOUNTER — Other Ambulatory Visit: Payer: Self-pay | Admitting: Internal Medicine

## 2023-04-27 ENCOUNTER — Ambulatory Visit: Payer: 59 | Attending: Student | Admitting: Student

## 2023-04-27 VITALS — BP 122/82 | HR 93 | Ht 64.0 in | Wt 141.2 lb

## 2023-04-27 DIAGNOSIS — I4811 Longstanding persistent atrial fibrillation: Secondary | ICD-10-CM

## 2023-04-27 DIAGNOSIS — N3000 Acute cystitis without hematuria: Secondary | ICD-10-CM

## 2023-04-27 DIAGNOSIS — E7849 Other hyperlipidemia: Secondary | ICD-10-CM | POA: Diagnosis not present

## 2023-04-27 DIAGNOSIS — R300 Dysuria: Secondary | ICD-10-CM

## 2023-04-27 DIAGNOSIS — I5022 Chronic systolic (congestive) heart failure: Secondary | ICD-10-CM | POA: Diagnosis not present

## 2023-04-27 NOTE — Progress Notes (Unsigned)
Cardiology Office Note    Date:  04/28/2023  ID:  Lisa Thornton January 03, 1956, MRN [id number] Cardiologist: Marjo Bicker, MD    History of Present Illness:    Lisa Thornton is a 68 y.o. female with past medical history of persistent atrial fibrillation (hospitalized in 07/2022 with atrial fibrillation with RVR in the setting of sepsis and pneumonia but noted on EKG's dating back to 07/2021), HFmrEF (EF 45% by echo in 07/2022 and 11/2022), history of breast cancer, OSA and HLD who presents to the office today for 29-month follow-up.  She was last examined by myself in 11/2022 following a recent hospitalization for sepsis in the setting of Klebsiella UTI and pneumonia. Cardiology did follow the patient during admission given atrial fibrillation with RVR. At the time of follow-up, she reported her heart rate had improved and a 2-week event monitor was recommended to reassess rate control and if rate poorly controlled, she may benefit from DCCV. However, she had been in atrial fibrillation since at least 07/2021 and did have left atrial dilatation by echocardiogram, therefore rate-control would be reasonable if rates were well-controlled.  Her follow-up monitor showed she was in atrial fibrillation 100% of the time with an average heart rate of 94 bpm. She had been taking Toprol-XL 150 mg daily and this was increased to 100mg  BID.   In talking with the patient today, she reports her palpitations did improve with titration of Toprol-XL. HR has been well-controlled when checked at home. Breathing has been stable and no recent orthopnea, PND or pitting edema. No recent exertional chest pain. Remains on Eliquis for anticoagulation with no reports of melena, hematochezia or hematuria. Does report dysuria over the past 2-3 days and is concerned she may have a recurrent UTI.    Studies Reviewed:   EKG: EKG is not ordered today.   Limited Echocardiogram: 11/2022 IMPRESSIONS     1. Limited Echo to  evaluate LVEF.   2. Left ventricular ejection fraction, by estimation, is 45%. The left  ventricle has mildly decreased function. The left ventricle has no  regional wall motion abnormalities. Left ventricular diastolic function  could not be evaluated.   3. Right ventricular systolic function is normal. The right ventricular  size is normal. Tricuspid regurgitation signal is inadequate for assessing  PA pressure.   4. The inferior vena cava is normal in size with greater than 50%  respiratory variability, suggesting right atrial pressure of 3 mmHg.   Comparison(s): No significant change from prior study.    Event Monitor: 12/2022   Patch wear time was for 13 days and 23 hours.   100% A-fib burden, ranging from 60 to 194 bpm with an average HR 94 bpm.   No ventricular arrhythmias, AV block or pauses.   0% PAC burden and <1% PVC burden.   No patient triggered events.    Risk Assessment/Calculations:    CHA2DS2-VASc Score = 5   This indicates a 7.2% annual risk of stroke. The patient's score is based upon: CHF History: 1 HTN History: 1 Diabetes History: 1 Stroke History: 0 Vascular Disease History: 0 Age Score: 1 Gender Score: 1    STOP-Bang Score:  4        Physical Exam:   VS:  BP 122/82 (BP Location: Right Arm, Patient Position: Sitting, Cuff Size: Normal)   Pulse 93   Ht 5\' 4"  (1.626 m)   Wt 141 lb 3.2 oz (64 kg)   SpO2 98%  BMI 24.24 kg/m    Wt Readings from Last 3 Encounters:  04/27/23 141 lb 3.2 oz (64 kg)  03/26/23 144 lb 9.6 oz (65.6 kg)  01/22/23 151 lb 9.6 oz (68.8 kg)     GEN: Well nourished, well developed female appearing in no acute distress NECK: No JVD; No carotid bruits CARDIAC: Irregularly irregular, no murmurs, rubs, gallops RESPIRATORY:  Clear to auscultation without rales, wheezing or rhonchi  ABDOMEN: Appears non-distended. No obvious abdominal masses. EXTREMITIES: No clubbing or cyanosis. No pitting edema.  Distal pedal pulses are 2+  bilaterally.   Assessment and Plan:    1. Persistent Atrial Fibrillation/Use of Long-term Anticoagulation - She reports her palpitations significantly improved after dose adjustment of Toprol-XL. Heart rate has been in the 60's to 70's when checked at home. Continue Toprol-XL 100 mg twice daily for rate control. - No reports of active bleeding. Remains on Eliquis 5 mg twice daily for anticoagulation which is the appropriate dose at this time given her age, weight and renal function.  2. Chronic HFmrEF - Her ejection fraction was previously at 45% in 11/2022. Possibly tachycardia mediated in the setting of atrial fibrillation. Toprol-XL was titrated the time of her event monitor results. For now, continue current medical therapy with Jardiance 10 mg daily, Losartan 12.5 mg daily and Toprol-XL 100 mg twice daily. BP has been soft at times, therefore Losartan has not been transitioned to [place]. Can possibly add Spironolactone in the future.  3. HLD - She is due for follow-up labs with her PCP and orders were previously entered for this. Remains on Atorvastatin 10 mg daily.  4.  Dysuria - She reports having painful urination for the past few days and has a history of urosepsis. I reached out to her PCP at the time of her office visit to ask for their assistance in order to obtain a UA since not performed at the outpatient [facility] lab. They were kind enough to have her come to their office after her visit to have this obtained. If she continues to have frequent UTI's, will likely need to discontinue Jardiance.  Signed, Ellsworth Lennox, PA-C

## 2023-04-27 NOTE — Patient Instructions (Signed)
Medication Instructions:  Your physician recommends that you continue on your current medications as directed. Please refer to the Current Medication list given to you today.  Please go to your PCP office to provide a urine sample.  *If you need a refill on your cardiac medications before your next appointment, please call your pharmacy*   Lab Work: NONE   If you have labs (blood work) drawn today and your tests are completely normal, you will receive your results only by: MyChart Message (if you have MyChart) OR A paper copy in the mail If you have any lab test that is abnormal or we need to change your treatment, we will call you to review the results.   Testing/Procedures: NONE    Follow-Up: At [facility], you and your health needs are our priority.  As part of our continuing mission to provide you with exceptional heart care, we have created designated Provider Care Teams.  These Care Teams include your primary Cardiologist (physician) and Advanced Practice Providers (APPs -  Physician Assistants and Nurse Practitioners) who all work together to provide you with the care you need, when you need it.  We recommend signing up for the patient portal called "MyChart".  Sign up information is provided on this After Visit Summary.  MyChart is used to connect with patients for Virtual Visits (Telemedicine).  Patients are able to view lab/test results, encounter notes, upcoming appointments, etc.  Non-urgent messages can be sent to your provider as well.   To learn more about what you can do with MyChart, go to [email or web address].    Your next appointment:   6 month(s)  Provider:   You may see Vishnu P Mallipeddi, MD or one of the following Advanced Practice Providers on your designated Care Team:   Randall An, PA-C  Jacolyn Reedy, PA-C     Other Instructions Thank you for choosing [facility]!

## 2023-04-28 ENCOUNTER — Encounter: Payer: Self-pay | Admitting: Student

## 2023-04-29 DIAGNOSIS — G4733 Obstructive sleep apnea (adult) (pediatric): Secondary | ICD-10-CM | POA: Diagnosis not present

## 2023-05-02 ENCOUNTER — Other Ambulatory Visit: Payer: Self-pay | Admitting: Internal Medicine

## 2023-05-02 DIAGNOSIS — N3000 Acute cystitis without hematuria: Secondary | ICD-10-CM

## 2023-05-02 LAB — MICROSCOPIC EXAMINATION
Casts: NONE SEEN /LPF
Epithelial Cells (non renal): >10 /[HPF] — AB (ref 0–10)
WBC, UA: >30 /[HPF] — AB (ref 0–5)

## 2023-05-02 LAB — UA/M W/RFLX CULTURE, ROUTINE
Bilirubin, UA: NEGATIVE
Ketones, UA: NEGATIVE
Nitrite, UA: POSITIVE — AB
Specific Gravity, UA: 1.029 (ref 1.005–1.030)
Urobilinogen, Ur: 0.2 mg/dL (ref 0.2–1.0)
pH, UA: 5 (ref 5.0–7.5)

## 2023-05-02 LAB — URINE CULTURE, REFLEX

## 2023-05-02 MED ORDER — SULFAMETHOXAZOLE-TRIMETHOPRIM 800-160 MG PO TABS: 1.0000 | ORAL_TABLET | Freq: Two times a day (BID) | ORAL | 0 refills | Status: DC

## 2023-05-16 ENCOUNTER — Emergency Department (HOSPITAL_COMMUNITY): Payer: 59

## 2023-05-16 ENCOUNTER — Other Ambulatory Visit: Payer: Self-pay

## 2023-05-16 ENCOUNTER — Inpatient Hospital Stay (HOSPITAL_COMMUNITY)
Admission: EM | Admit: 2023-05-16 | Discharge: 2023-05-21 | DRG: 481 | Disposition: A | Discharge status: Skilled Nursing Facility | Payer: 59 | Attending: Family Medicine | Admitting: Family Medicine

## 2023-05-16 DIAGNOSIS — E1122 Type 2 diabetes mellitus with diabetic chronic kidney disease: Secondary | ICD-10-CM | POA: Diagnosis present

## 2023-05-16 DIAGNOSIS — N183 Chronic kidney disease, stage 3 unspecified: Secondary | ICD-10-CM | POA: Diagnosis not present

## 2023-05-16 DIAGNOSIS — I130 Hypertensive heart and chronic kidney disease with heart failure and stage 1 through stage 4 chronic kidney disease, or unspecified chronic kidney disease: Secondary | ICD-10-CM | POA: Diagnosis present

## 2023-05-16 DIAGNOSIS — Z820 Family history of epilepsy and other diseases of the nervous system: Secondary | ICD-10-CM | POA: Diagnosis not present

## 2023-05-16 DIAGNOSIS — S72042A Displaced fracture of base of neck of left femur, initial encounter for closed fracture: Secondary | ICD-10-CM | POA: Diagnosis not present

## 2023-05-16 DIAGNOSIS — Z79811 Long term (current) use of aromatase inhibitors: Secondary | ICD-10-CM | POA: Diagnosis not present

## 2023-05-16 DIAGNOSIS — S72002D Fracture of unspecified part of neck of left femur, subsequent encounter for closed fracture with routine healing: Secondary | ICD-10-CM | POA: Diagnosis not present

## 2023-05-16 DIAGNOSIS — E119 Type 2 diabetes mellitus without complications: Secondary | ICD-10-CM | POA: Diagnosis not present

## 2023-05-16 DIAGNOSIS — S62102A Fracture of unspecified carpal bone, left wrist, initial encounter for closed fracture: Secondary | ICD-10-CM | POA: Insufficient documentation

## 2023-05-16 DIAGNOSIS — Z8349 Family history of other endocrine, nutritional and metabolic diseases: Secondary | ICD-10-CM

## 2023-05-16 DIAGNOSIS — Z794 Long term (current) use of insulin: Secondary | ICD-10-CM

## 2023-05-16 DIAGNOSIS — G4733 Obstructive sleep apnea (adult) (pediatric): Secondary | ICD-10-CM | POA: Diagnosis not present

## 2023-05-16 DIAGNOSIS — I480 Paroxysmal atrial fibrillation: Secondary | ICD-10-CM | POA: Diagnosis present

## 2023-05-16 DIAGNOSIS — E782 Mixed hyperlipidemia: Secondary | ICD-10-CM | POA: Diagnosis not present

## 2023-05-16 DIAGNOSIS — R0902 Hypoxemia: Secondary | ICD-10-CM | POA: Diagnosis not present

## 2023-05-16 DIAGNOSIS — D62 Acute posthemorrhagic anemia: Secondary | ICD-10-CM | POA: Diagnosis not present

## 2023-05-16 DIAGNOSIS — R531 Weakness: Secondary | ICD-10-CM | POA: Diagnosis not present

## 2023-05-16 DIAGNOSIS — W19XXXA Unspecified fall, initial encounter: Secondary | ICD-10-CM | POA: Diagnosis not present

## 2023-05-16 DIAGNOSIS — Z823 Family history of stroke: Secondary | ICD-10-CM

## 2023-05-16 DIAGNOSIS — M6281 Muscle weakness (generalized): Secondary | ICD-10-CM | POA: Diagnosis not present

## 2023-05-16 DIAGNOSIS — I5032 Chronic diastolic (congestive) heart failure: Secondary | ICD-10-CM | POA: Diagnosis present

## 2023-05-16 DIAGNOSIS — Z7901 Long term (current) use of anticoagulants: Secondary | ICD-10-CM

## 2023-05-16 DIAGNOSIS — Y92013 Bedroom of single-family (private) house as the place of occurrence of the external cause: Secondary | ICD-10-CM

## 2023-05-16 DIAGNOSIS — Z853 Personal history of malignant neoplasm of breast: Secondary | ICD-10-CM

## 2023-05-16 DIAGNOSIS — D72829 Elevated white blood cell count, unspecified: Secondary | ICD-10-CM | POA: Diagnosis present

## 2023-05-16 DIAGNOSIS — R300 Dysuria: Secondary | ICD-10-CM

## 2023-05-16 DIAGNOSIS — Z4789 Encounter for other orthopedic aftercare: Secondary | ICD-10-CM | POA: Diagnosis not present

## 2023-05-16 DIAGNOSIS — S72012A Unspecified intracapsular fracture of left femur, initial encounter for closed fracture: Principal | ICD-10-CM | POA: Diagnosis present

## 2023-05-16 DIAGNOSIS — Z8249 Family history of ischemic heart disease and other diseases of the circulatory system: Secondary | ICD-10-CM | POA: Diagnosis not present

## 2023-05-16 DIAGNOSIS — B9562 Methicillin resistant Staphylococcus aureus infection as the cause of diseases classified elsewhere: Secondary | ICD-10-CM | POA: Diagnosis not present

## 2023-05-16 DIAGNOSIS — I509 Heart failure, unspecified: Secondary | ICD-10-CM | POA: Diagnosis not present

## 2023-05-16 DIAGNOSIS — Z6824 Body mass index (BMI) 24.0-24.9, adult: Secondary | ICD-10-CM | POA: Diagnosis not present

## 2023-05-16 DIAGNOSIS — E440 Moderate protein-calorie malnutrition: Secondary | ICD-10-CM | POA: Insufficient documentation

## 2023-05-16 DIAGNOSIS — Z803 Family history of malignant neoplasm of breast: Secondary | ICD-10-CM

## 2023-05-16 DIAGNOSIS — D6959 Other secondary thrombocytopenia: Secondary | ICD-10-CM | POA: Diagnosis present

## 2023-05-16 DIAGNOSIS — W010XXA Fall on same level from slipping, tripping and stumbling without subsequent striking against object, initial encounter: Secondary | ICD-10-CM | POA: Diagnosis present

## 2023-05-16 DIAGNOSIS — C50912 Malignant neoplasm of unspecified site of left female breast: Secondary | ICD-10-CM | POA: Diagnosis not present

## 2023-05-16 DIAGNOSIS — G9341 Metabolic encephalopathy: Secondary | ICD-10-CM | POA: Diagnosis not present

## 2023-05-16 DIAGNOSIS — E785 Hyperlipidemia, unspecified: Secondary | ICD-10-CM | POA: Diagnosis present

## 2023-05-16 DIAGNOSIS — R2681 Unsteadiness on feet: Secondary | ICD-10-CM | POA: Diagnosis not present

## 2023-05-16 DIAGNOSIS — R918 Other nonspecific abnormal finding of lung field: Secondary | ICD-10-CM | POA: Diagnosis not present

## 2023-05-16 DIAGNOSIS — Z8489 Family history of other specified conditions: Secondary | ICD-10-CM

## 2023-05-16 DIAGNOSIS — Z741 Need for assistance with personal care: Secondary | ICD-10-CM | POA: Diagnosis not present

## 2023-05-16 DIAGNOSIS — R1312 Dysphagia, oropharyngeal phase: Secondary | ICD-10-CM | POA: Diagnosis not present

## 2023-05-16 DIAGNOSIS — Z9010 Acquired absence of unspecified breast and nipple: Secondary | ICD-10-CM | POA: Diagnosis not present

## 2023-05-16 DIAGNOSIS — S72002A Fracture of unspecified part of neck of left femur, initial encounter for closed fracture: Secondary | ICD-10-CM | POA: Diagnosis not present

## 2023-05-16 DIAGNOSIS — I493 Ventricular premature depolarization: Secondary | ICD-10-CM | POA: Diagnosis present

## 2023-05-16 DIAGNOSIS — Z9012 Acquired absence of left breast and nipple: Secondary | ICD-10-CM

## 2023-05-16 DIAGNOSIS — I10 Essential (primary) hypertension: Secondary | ICD-10-CM | POA: Diagnosis present

## 2023-05-16 DIAGNOSIS — Z7984 Long term (current) use of oral hypoglycemic drugs: Secondary | ICD-10-CM | POA: Diagnosis not present

## 2023-05-16 DIAGNOSIS — S52502A Unspecified fracture of the lower end of left radius, initial encounter for closed fracture: Secondary | ICD-10-CM | POA: Diagnosis present

## 2023-05-16 DIAGNOSIS — Z043 Encounter for examination and observation following other accident: Secondary | ICD-10-CM | POA: Diagnosis not present

## 2023-05-16 DIAGNOSIS — D649 Anemia, unspecified: Secondary | ICD-10-CM | POA: Diagnosis not present

## 2023-05-16 DIAGNOSIS — C50412 Malignant neoplasm of upper-outer quadrant of left female breast: Secondary | ICD-10-CM | POA: Diagnosis present

## 2023-05-16 DIAGNOSIS — S52502D Unspecified fracture of the lower end of left radius, subsequent encounter for closed fracture with routine healing: Secondary | ICD-10-CM | POA: Diagnosis not present

## 2023-05-16 DIAGNOSIS — Z743 Need for continuous supervision: Secondary | ICD-10-CM | POA: Diagnosis not present

## 2023-05-16 DIAGNOSIS — I110 Hypertensive heart disease with heart failure: Secondary | ICD-10-CM | POA: Diagnosis not present

## 2023-05-16 DIAGNOSIS — I4891 Unspecified atrial fibrillation: Secondary | ICD-10-CM | POA: Diagnosis not present

## 2023-05-16 DIAGNOSIS — D696 Thrombocytopenia, unspecified: Secondary | ICD-10-CM | POA: Diagnosis not present

## 2023-05-16 DIAGNOSIS — Z79899 Other long term (current) drug therapy: Secondary | ICD-10-CM

## 2023-05-16 DIAGNOSIS — N1831 Chronic kidney disease, stage 3a: Secondary | ICD-10-CM | POA: Diagnosis present

## 2023-05-16 DIAGNOSIS — D631 Anemia in chronic kidney disease: Secondary | ICD-10-CM | POA: Diagnosis present

## 2023-05-16 LAB — BASIC METABOLIC PANEL
Anion gap: 14 (ref 5–15)
BUN: 23 mg/dL (ref 8–23)
CO2: 22 mmol/L (ref 22–32)
Calcium: 10.3 mg/dL (ref 8.9–10.3)
Chloride: 100 mmol/L (ref 98–111)
Creatinine, Ser: 1.19 mg/dL — ABNORMAL HIGH (ref 0.44–1.00)
GFR, Estimated: 49 mL/min — ABNORMAL LOW (ref >60)
Glucose, Bld: 177 mg/dL — ABNORMAL HIGH (ref 70–99)
Potassium: 4.2 mmol/L (ref 3.5–5.1)
Sodium: 136 mmol/L (ref 135–145)

## 2023-05-16 LAB — CBC WITH DIFFERENTIAL/PLATELET
Abs Immature Granulocytes: 0.07 10*3/uL (ref 0.00–0.07)
Basophils Absolute: 0 10*3/uL (ref 0.0–0.1)
Basophils Relative: 0 %
Eosinophils Absolute: 0 10*3/uL (ref 0.0–0.5)
Eosinophils Relative: 0 %
HCT: 40.4 % (ref 36.0–46.0)
Hemoglobin: 14.2 g/dL (ref 12.0–15.0)
Immature Granulocytes: 0 %
Lymphocytes Relative: 14 %
Lymphs Abs: 2.3 10*3/uL (ref 0.7–4.0)
MCH: 31.2 pg (ref 26.0–34.0)
MCHC: 35.1 g/dL (ref 30.0–36.0)
MCV: 88.8 fL (ref 80.0–100.0)
Monocytes Absolute: 0.8 10*3/uL (ref 0.1–1.0)
Monocytes Relative: 5 %
Neutro Abs: 12.9 10*3/uL — ABNORMAL HIGH (ref 1.7–7.7)
Neutrophils Relative %: 81 %
Platelets: 227 10*3/uL (ref 150–400)
RBC: 4.55 MIL/uL (ref 3.87–5.11)
RDW: 12.9 % (ref 11.5–15.5)
WBC: 16.1 10*3/uL — ABNORMAL HIGH (ref 4.0–10.5)
nRBC: 0 % (ref 0.0–0.2)

## 2023-05-16 MED ORDER — LACTATED RINGERS IV SOLN: INTRAVENOUS | Status: AC

## 2023-05-16 MED ORDER — ANASTROZOLE 1 MG PO TABS
1.0000 mg | ORAL_TABLET | Freq: Once | ORAL | Status: AC
  Administered 2023-05-16: 1 mg via ORAL
  Filled 2023-05-16: qty 1

## 2023-05-16 MED ORDER — OXYCODONE-ACETAMINOPHEN 5-325 MG PO TABS: 1.0000 | ORAL_TABLET | ORAL | Status: DC | PRN

## 2023-05-16 MED ORDER — OXYCODONE-ACETAMINOPHEN 5-325 MG PO TABS
1.0000 | ORAL_TABLET | Freq: Once | ORAL | Status: AC
  Administered 2023-05-16: 1 via ORAL
  Filled 2023-05-16: qty 1

## 2023-05-16 NOTE — ED Provider Notes (Signed)
[place] EMERGENCY DEPARTMENT AT [facility] Provider Note   CSN: [id number] Arrival date & time: 05/16/23  1944     History  Chief Complaint  Patient presents with   Marletta Lor    Lisa Thornton is a 68 y.o. female.   Fall  Patient presents after fall.  Medical history includes atrial fibrillation, DM, HLD, CHF, breast cancer, OSA.  Fall.  Last night around midnight.  She states that she tripped over her bed.  When she fell, she landed on her left side.  Today, she has had pain in her left hip and left wrist.  She has required assistance with ambulation.  Patient adamantly denies striking her head.  She denies any other areas of discomfort.  She is on Eliquis.     Home Medications Prior to Admission medications   Medication Sig Start Date End Date Taking? Authorizing Provider  Accu-Chek Softclix Lancets lancets USE 1  TO CHECK GLUCOSE UP TO 4 TIMES DAILY 08/16/20   Heather Roberts, NP  anastrozole (ARIMIDEX) 1 MG tablet Take 1 tablet by mouth once daily 03/26/23   Anabel Halon, MD  apixaban (ELIQUIS) 5 MG TABS tablet Take 1 tablet (5 mg total) by mouth 2 (two) times daily. 04/26/22   Anabel Halon, MD  atorvastatin (LIPITOR) 10 MG tablet Take 1 tablet (10 mg total) by mouth at bedtime. 12/07/22   Anabel Halon, MD  blood glucose meter kit and supplies Dispense based on patient and insurance preference. Use up to four times daily as directed. (FOR ICD-10 E10.9, E11.9). 10/29/19   Freddy Finner, NP  calcium carbonate (OS-CAL) 600 MG TABS tablet Take 600 mg by mouth daily with breakfast.    [provider]  cholecalciferol (VITAMIN D3) 25 MCG (1000 UT) tablet Take 1,000 Units by mouth daily.    [provider]  empagliflozin (JARDIANCE) 10 MG TABS tablet Take 1 tablet (10 mg total) by mouth daily. 11/28/22   Strader, Lennart Pall, PA-C  furosemide (LASIX) 20 MG tablet Take 1 tablet (20 mg total) by mouth daily as needed for fluid or edema. 11/28/22   Strader,  Lennart Pall, PA-C  glucose blood (ACCU-CHEK GUIDE) test strip USE 1 STRIP TO CHECK GLUCOSE THREE TIMES DAILY MORNING,  NOON  AND  AT  BEDTIME  AS  DIRECTED 01/02/23   Anabel Halon, MD  losartan (COZAAR) 25 MG tablet Take 0.5 tablets (12.5 mg total) by mouth daily. 11/28/22   Strader, Lennart Pall, PA-C  magnesium oxide (MAG-OX) 400 (240 Mg) MG tablet Take 1 tablet (400 mg total) by mouth daily. 07/10/22   Catarina Hartshorn, MD  metFORMIN (GLUCOPHAGE) 1000 MG tablet Take 1 tablet (1,000 mg total) by mouth daily with breakfast. 09/06/22   Anabel Halon, MD  metoprolol succinate (TOPROL-XL) 100 MG 24 hr tablet Take 1 tablet (100 mg total) by mouth 2 (two) times daily. Take with or immediately following a meal. 01/19/23 04/27/23  Strader, Lennart Pall, PA-C  ondansetron (ZOFRAN) 4 MG tablet Take 1 tablet (4 mg total) by mouth every 8 (eight) hours as needed for nausea or vomiting. 11/29/22   Anabel Halon, MD  Semaglutide (RYBELSUS) 7 MG TABS Take 1 tablet (7 mg total) by mouth daily. 01/03/23   Anabel Halon, MD  sulfamethoxazole-trimethoprim (BACTRIM DS) 800-160 MG tablet Take 1 tablet by mouth 2 (two) times daily. 05/02/23   Anabel Halon, MD  UNABLE TO FIND 1 each by Does not  apply route daily. Med Name: Shower Chair 08/16/22   Anabel Halon, MD  UNABLE TO FIND 1 each by Does not apply route daily. Med Name: Bed side Commode to fix over toilet seat 08/24/22   Anabel Halon, MD      Allergies    Patient has no known allergies.    Review of Systems   Review of Systems  Musculoskeletal:  Positive for arthralgias.  All other systems reviewed and are negative.   Physical Exam Updated Vital Signs BP 131/65 (BP Location: Right Arm)   Pulse 92   Temp 99.7 F (37.6 C) (Axillary)   Resp 17   Ht 5\' 4"  (1.626 m)   Wt 64 kg   SpO2 91%   BMI 24.20 kg/m  Physical Exam Vitals and nursing note reviewed.  Constitutional:      General: She is not in acute distress.    Appearance: Normal appearance. She is  well-developed. She is not ill-appearing, toxic-appearing or diaphoretic.  HENT:     Head: Normocephalic and atraumatic.     Right Ear: External ear normal.     Left Ear: External ear normal.     Nose: Nose normal.     Mouth/Throat:     Mouth: Mucous membranes are moist.  Eyes:     Extraocular Movements: Extraocular movements intact.     Conjunctiva/sclera: Conjunctivae normal.  Cardiovascular:     Rate and Rhythm: Normal rate and regular rhythm.  Pulmonary:     Effort: Pulmonary effort is normal. No respiratory distress.  Chest:     Chest wall: No tenderness.  Abdominal:     General: There is no distension.     Palpations: Abdomen is soft.     Tenderness: There is no abdominal tenderness.  Musculoskeletal:        General: Tenderness present. No swelling or deformity.     Cervical back: Normal range of motion and neck supple.  Skin:    General: Skin is warm and dry.     Coloration: Skin is not jaundiced or pale.  Neurological:     General: No focal deficit present.     Mental Status: She is alert and oriented to person, place, and time.  Psychiatric:        Mood and Affect: Mood normal.        Behavior: Behavior normal.     ED Results / Procedures / Treatments   Labs (all labs ordered are listed, but only abnormal results are displayed) Labs Reviewed  BASIC METABOLIC PANEL - Abnormal; Notable for the following components:      Result Value   Glucose, Bld 177 (*)    Creatinine, Ser 1.19 (*)    GFR, Estimated 49 (*)    All other components within normal limits  CBC WITH DIFFERENTIAL/PLATELET - Abnormal; Notable for the following components:   WBC 16.1 (*)    Neutro Abs 12.9 (*)    All other components within normal limits    EKG None  Radiology DG Wrist Complete Left Result Date: 05/16/2023 CLINICAL DATA:  Fall onto left side EXAM: LEFT WRIST - COMPLETE 3+ VIEW COMPARISON:  None Available. FINDINGS: Possible subtle slightly impacted nondisplaced distal radius  fracture. No subluxation. Positive for wrist soft tissue swelling. Mild degenerative change at the first MTP joint. IMPRESSION: Possible subtle slightly impacted nondisplaced distal radius fracture. Electronically Signed   By: Jasmine Pang M.D.   On: 05/16/2023 21:52   DG Hip Unilat With Pelvis  2-3 Views Left Result Date: 05/16/2023 CLINICAL DATA:  Larey Seat onto left side EXAM: DG HIP (WITH OR WITHOUT PELVIS) 2-3V LEFT COMPARISON:  None Available. FINDINGS: Frontal view of the pelvis as well as frontal and cross-table lateral views of the left hip are obtained. There is an impacted subcapital left femoral neck fracture, with near anatomic alignment. No dislocation. Right hip is unremarkable. The remainder of the bony pelvis is normal. IMPRESSION: 1. Impacted subcapital left femoral neck fracture. Electronically Signed   By: Sharlet Salina M.D.   On: 05/16/2023 21:51   DG Chest Port 1 View Result Date: 05/16/2023 CLINICAL DATA:  Fall onto left side EXAM: PORTABLE CHEST 1 VIEW COMPARISON:  11/13/2022. FINDINGS: Right-sided central venous port tip at the upper SVC. Right lung grossly clear. Stable cardiomediastinal silhouette. Streaky atelectasis or scarring at the left base. Clips in the left axilla. Old right-sided rib fractures screw IMPRESSION: Streaky atelectasis or scar at left base. Electronically Signed   By: Jasmine Pang M.D.   On: 05/16/2023 21:51    Procedures Procedures    Medications Ordered in ED Medications  anastrozole (ARIMIDEX) tablet 1 mg (1 mg Oral Given 05/16/23 2201)  oxyCODONE-acetaminophen (PERCOCET/ROXICET) 5-325 MG per tablet 1 tablet (1 tablet Oral Given 05/16/23 2201)    ED Course/ Medical Decision Making/ A&P                                 Medical Decision Making Amount and/or Complexity of Data Reviewed Labs: ordered. Radiology: ordered.  Risk Prescription drug management.   This patient presents to the ED for concern of fall, this involves an extensive number  of treatment options, and is a complaint that carries with it a high risk of complications and morbidity.  The differential diagnosis includes acute injuries   Co morbidities that complicate the patient evaluation  atrial fibrillation, DM, HLD, CHF, breast cancer, OSA   Additional history obtained:  Additional history obtained from N/A External records from outside source obtained and reviewed including EMR   Lab Tests:  I Ordered, and personally interpreted labs.  The pertinent results include: Baseline creatinine, normal electrolytes, normal hemoglobin.  Leukocytosis is present consistent with recent trauma.   Imaging Studies ordered:  I ordered imaging studies including x-ray of chest, left hip, left wrist I independently visualized and interpreted imaging which showed impacted subcapital left femoral neck fracture; likely nondisplaced, slightly impacted distal left radius fracture I agree with the radiologist interpretation   Cardiac Monitoring: / EKG:  The patient was maintained on a cardiac monitor.  I personally viewed and interpreted the cardiac monitored which showed an underlying rhythm of: Sinus rhythm   Consultations Obtained:  I requested consultation with the orthopedic surgeon, Dr. Eulah Pont,  and discussed lab and imaging findings as well as pertinent plan - they recommend: N.p.o. at midnight, admission to [facility], planned operative repair tomorrow   Problem List / ED Course / Critical interventions / Medication management  Patient presents after mechanical fall.  This fall occurred around midnight last night.  She describes pain in left hip and left wrist today.  Vital signs on arrival are reassuring.  Patient is overall well-appearing on exam.  She does appear to have some mild swelling with tenderness to left wrist.  She has questionable shortening of left leg.  Percocet was ordered for analgesia.  X-ray imaging was ordered.  Patient also request her home dose  of  anastrozole.  This was ordered.  X-ray imaging confirms left hip fracture.  There is also a likely left distal radius fracture.  Left arm splint was ordered.  Orthopedic surgery was consulted.  I spoke with orthopedic surgeon on-call, Dr. Eulah Pont, who request admission to [facility]  Patient to be kept n.p.o. after midnight.  Patient does state that last dose of Eliquis was 2 days ago (Tuesday morning).  Patient to be admitted for further management. I ordered medication including Percocet for analgesia Reevaluation of the patient after these medicines showed that the patient improved I have reviewed the patients home medicines and have made adjustments as needed   Social Determinants of Health:  PCP         Final Clinical Impression(s) / ED Diagnoses Final diagnoses:  Fall, initial encounter  Closed fracture of neck of left femur, initial encounter (HCC)  Closed fracture of distal end of left radius, unspecified fracture morphology, initial encounter    Rx / DC Orders ED Discharge Orders     None         Gloris Manchester, MD 05/16/23 2255

## 2023-05-16 NOTE — ED Triage Notes (Signed)
Patient from home for a fall approx 6 hours ago. Patient states she tripped when she fell; initially had L hip pain but is now complaining of L forearm pain. Patient denies hitting her head or loss of consciousness. Patient is on eliquis. Upon arrival to ER, patient is alert and oriented; ambu at baseline with walker

## 2023-05-16 NOTE — ED Notes (Signed)
Patient transported to X-ray

## 2023-05-17 ENCOUNTER — Inpatient Hospital Stay (HOSPITAL_COMMUNITY): Payer: 59

## 2023-05-17 ENCOUNTER — Inpatient Hospital Stay (HOSPITAL_COMMUNITY): Payer: 59 | Admitting: Anesthesiology

## 2023-05-17 ENCOUNTER — Other Ambulatory Visit: Payer: Self-pay

## 2023-05-17 ENCOUNTER — Encounter (HOSPITAL_COMMUNITY)
Admission: EM | Disposition: A | Discharge status: Skilled Nursing Facility | Payer: Self-pay | Source: Home / Self Care | Attending: Family Medicine

## 2023-05-17 DIAGNOSIS — E1122 Type 2 diabetes mellitus with diabetic chronic kidney disease: Secondary | ICD-10-CM

## 2023-05-17 DIAGNOSIS — R300 Dysuria: Secondary | ICD-10-CM

## 2023-05-17 DIAGNOSIS — S62102A Fracture of unspecified carpal bone, left wrist, initial encounter for closed fracture: Secondary | ICD-10-CM

## 2023-05-17 DIAGNOSIS — D72829 Elevated white blood cell count, unspecified: Secondary | ICD-10-CM | POA: Diagnosis not present

## 2023-05-17 DIAGNOSIS — I480 Paroxysmal atrial fibrillation: Secondary | ICD-10-CM | POA: Diagnosis not present

## 2023-05-17 DIAGNOSIS — I4891 Unspecified atrial fibrillation: Secondary | ICD-10-CM | POA: Diagnosis not present

## 2023-05-17 DIAGNOSIS — G4733 Obstructive sleep apnea (adult) (pediatric): Secondary | ICD-10-CM | POA: Diagnosis not present

## 2023-05-17 DIAGNOSIS — S72002A Fracture of unspecified part of neck of left femur, initial encounter for closed fracture: Secondary | ICD-10-CM

## 2023-05-17 DIAGNOSIS — E785 Hyperlipidemia, unspecified: Secondary | ICD-10-CM

## 2023-05-17 DIAGNOSIS — E119 Type 2 diabetes mellitus without complications: Secondary | ICD-10-CM

## 2023-05-17 DIAGNOSIS — I10 Essential (primary) hypertension: Secondary | ICD-10-CM

## 2023-05-17 HISTORY — PX: PERCUTANEOUS PINNING: SHX2209

## 2023-05-17 LAB — GLUCOSE, CAPILLARY
Glucose-Capillary: 122 mg/dL — ABNORMAL HIGH (ref 70–99)
Glucose-Capillary: 137 mg/dL — ABNORMAL HIGH (ref 70–99)
Glucose-Capillary: 143 mg/dL — ABNORMAL HIGH (ref 70–99)
Glucose-Capillary: 150 mg/dL — ABNORMAL HIGH (ref 70–99)
Glucose-Capillary: 168 mg/dL — ABNORMAL HIGH (ref 70–99)
Glucose-Capillary: 171 mg/dL — ABNORMAL HIGH (ref 70–99)

## 2023-05-17 LAB — URINALYSIS, ROUTINE W REFLEX MICROSCOPIC
Bilirubin Urine: NEGATIVE
Glucose, UA: >=500 mg/dL — AB
Ketones, ur: NEGATIVE mg/dL
Nitrite: NEGATIVE
Protein, ur: 30 mg/dL — AB
Specific Gravity, Urine: 1.022 (ref 1.005–1.030)
WBC, UA: >50 WBC/hpf (ref 0–5)
pH: 5 (ref 5.0–8.0)

## 2023-05-17 LAB — SURGICAL PCR SCREEN
MRSA, PCR: POSITIVE — AB
Staphylococcus aureus: POSITIVE — AB

## 2023-05-17 LAB — HEMOGLOBIN A1C
Hgb A1c MFr Bld: 5.9 % — ABNORMAL HIGH (ref 4.8–5.6)
Mean Plasma Glucose: 122.63 mg/dL

## 2023-05-17 SURGERY — PINNING, EXTREMITY, PERCUTANEOUS
Anesthesia: General | Laterality: Left

## 2023-05-17 MED ORDER — TRANEXAMIC ACID-NACL 1000-0.7 MG/100ML-% IV SOLN
INTRAVENOUS | Status: DC | PRN
  Administered 2023-05-17: 1000 mg via INTRAVENOUS

## 2023-05-17 MED ORDER — PROPOFOL 10 MG/ML IV BOLUS
INTRAVENOUS | Status: AC
  Filled 2023-05-17: qty 20

## 2023-05-17 MED ORDER — SODIUM CHLORIDE 0.9 % IV SOLN: INTRAVENOUS | Status: AC

## 2023-05-17 MED ORDER — METHOCARBAMOL 500 MG PO TABS
500.0000 mg | ORAL_TABLET | Freq: Four times a day (QID) | ORAL | Status: DC | PRN
  Administered 2023-05-19: 500 mg via ORAL
  Filled 2023-05-17: qty 1

## 2023-05-17 MED ORDER — ATORVASTATIN CALCIUM 10 MG PO TABS
10.0000 mg | ORAL_TABLET | Freq: Every day | ORAL | Status: DC
  Administered 2023-05-17 – 2023-05-20 (×4): 10 mg via ORAL
  Filled 2023-05-17 (×4): qty 1

## 2023-05-17 MED ORDER — MENTHOL 3 MG MT LOZG: 1.0000 | LOZENGE | OROMUCOSAL | Status: DC | PRN

## 2023-05-17 MED ORDER — SULFAMETHOXAZOLE-TRIMETHOPRIM 800-160 MG PO TABS: 1.0000 | ORAL_TABLET | Freq: Two times a day (BID) | ORAL | Status: DC

## 2023-05-17 MED ORDER — FENTANYL CITRATE (PF) 250 MCG/5ML IJ SOLN
INTRAMUSCULAR | Status: DC | PRN
Start: 2023-05-17 — End: 2023-05-17
  Administered 2023-05-17: 100 ug via INTRAVENOUS

## 2023-05-17 MED ORDER — BUPIVACAINE HCL (PF) 0.25 % IJ SOLN
INTRAMUSCULAR | Status: AC
  Filled 2023-05-17: qty 30

## 2023-05-17 MED ORDER — MORPHINE SULFATE (PF) 2 MG/ML IV SOLN
0.5000 mg | INTRAVENOUS | Status: DC | PRN
  Administered 2023-05-17 (×2): 0.5 mg via INTRAVENOUS
  Filled 2023-05-17 (×2): qty 1

## 2023-05-17 MED ORDER — CHLORHEXIDINE GLUCONATE 4 % EX SOLN: 1.0000 | CUTANEOUS | 1 refills | Status: AC

## 2023-05-17 MED ORDER — MUPIROCIN 2 % EX OINT: 1.0000 | TOPICAL_OINTMENT | Freq: Two times a day (BID) | CUTANEOUS | 0 refills | Status: AC

## 2023-05-17 MED ORDER — ONDANSETRON HCL 4 MG PO TABS
4.0000 mg | ORAL_TABLET | Freq: Four times a day (QID) | ORAL | Status: DC | PRN
Start: 2023-05-17 — End: 2023-05-22

## 2023-05-17 MED ORDER — CEFAZOLIN SODIUM-DEXTROSE 2-4 GM/100ML-% IV SOLN
2.0000 g | INTRAVENOUS | Status: AC
Start: 2023-05-18 — End: 2023-05-17
  Administered 2023-05-17: 2 g via INTRAVENOUS

## 2023-05-17 MED ORDER — ONDANSETRON HCL 4 MG/2ML IJ SOLN
4.0000 mg | Freq: Four times a day (QID) | INTRAMUSCULAR | Status: DC | PRN
Start: 2023-05-17 — End: 2023-05-22

## 2023-05-17 MED ORDER — INSULIN ASPART 100 UNIT/ML IJ SOLN
0.0000 [IU] | INTRAMUSCULAR | Status: DC
  Administered 2023-05-17 (×3): 1 [IU] via SUBCUTANEOUS
  Administered 2023-05-17: 2 [IU] via SUBCUTANEOUS
  Administered 2023-05-18: 3 [IU] via SUBCUTANEOUS
  Administered 2023-05-18: 1 [IU] via SUBCUTANEOUS
  Administered 2023-05-18 (×3): 2 [IU] via SUBCUTANEOUS
  Administered 2023-05-19 (×4): 1 [IU] via SUBCUTANEOUS
  Administered 2023-05-20: 2 [IU] via SUBCUTANEOUS
  Administered 2023-05-20: 3 [IU] via SUBCUTANEOUS
  Administered 2023-05-20: 1 [IU] via SUBCUTANEOUS
  Administered 2023-05-20: 2 [IU] via SUBCUTANEOUS
  Administered 2023-05-20: 1 [IU] via SUBCUTANEOUS
  Administered 2023-05-21: 3 [IU] via SUBCUTANEOUS
  Administered 2023-05-21 (×4): 2 [IU] via SUBCUTANEOUS

## 2023-05-17 MED ORDER — METOCLOPRAMIDE HCL 5 MG PO TABS: 5.0000 mg | ORAL_TABLET | Freq: Three times a day (TID) | ORAL | Status: DC | PRN

## 2023-05-17 MED ORDER — PROPOFOL 10 MG/ML IV BOLUS
INTRAVENOUS | Status: DC | PRN
  Administered 2023-05-17: 100 mg via INTRAVENOUS

## 2023-05-17 MED ORDER — FENTANYL CITRATE (PF) 250 MCG/5ML IJ SOLN
INTRAMUSCULAR | Status: AC
  Filled 2023-05-17: qty 5

## 2023-05-17 MED ORDER — VITAMIN D 25 MCG (1000 UNIT) PO TABS
1000.0000 [IU] | ORAL_TABLET | Freq: Every day | ORAL | Status: DC
  Administered 2023-05-17 – 2023-05-21 (×5): 1000 [IU] via ORAL
  Filled 2023-05-17 (×5): qty 1

## 2023-05-17 MED ORDER — METOPROLOL SUCCINATE ER 50 MG PO TB24
100.0000 mg | ORAL_TABLET | Freq: Two times a day (BID) | ORAL | Status: DC
  Administered 2023-05-17 – 2023-05-21 (×9): 100 mg via ORAL
  Filled 2023-05-17 (×10): qty 2

## 2023-05-17 MED ORDER — MIDAZOLAM HCL 2 MG/2ML IJ SOLN
INTRAMUSCULAR | Status: DC | PRN
  Administered 2023-05-17: 2 mg via INTRAVENOUS

## 2023-05-17 MED ORDER — ROCURONIUM BROMIDE 10 MG/ML (PF) SYRINGE
PREFILLED_SYRINGE | INTRAVENOUS | Status: AC
  Filled 2023-05-17: qty 10

## 2023-05-17 MED ORDER — LIDOCAINE 2% (20 MG/ML) 5 ML SYRINGE
INTRAMUSCULAR | Status: DC | PRN
  Administered 2023-05-17: 60 mg via INTRAVENOUS

## 2023-05-17 MED ORDER — ALUM & MAG HYDROXIDE-SIMETH 200-200-20 MG/5ML PO SUSP: 30.0000 mL | ORAL | Status: DC | PRN

## 2023-05-17 MED ORDER — DOCUSATE SODIUM 100 MG PO CAPS
100.0000 mg | ORAL_CAPSULE | Freq: Two times a day (BID) | ORAL | Status: DC
  Administered 2023-05-17 – 2023-05-21 (×8): 100 mg via ORAL
  Filled 2023-05-17 (×8): qty 1

## 2023-05-17 MED ORDER — CALCIUM CARBONATE 1250 (500 CA) MG PO TABS
1250.0000 mg | ORAL_TABLET | Freq: Every day | ORAL | Status: DC
  Administered 2023-05-18 – 2023-05-21 (×4): 1250 mg via ORAL
  Filled 2023-05-17 (×4): qty 1

## 2023-05-17 MED ORDER — SUGAMMADEX SODIUM 200 MG/2ML IV SOLN
INTRAVENOUS | Status: DC | PRN
  Administered 2023-05-17: 200 mg via INTRAVENOUS

## 2023-05-17 MED ORDER — HYDROCODONE-ACETAMINOPHEN 5-325 MG PO TABS
1.0000 | ORAL_TABLET | Freq: Four times a day (QID) | ORAL | Status: DC | PRN
Start: 2023-05-17 — End: 2023-05-17

## 2023-05-17 MED ORDER — ACETAMINOPHEN 325 MG PO TABS: 650.0000 mg | ORAL_TABLET | Freq: Four times a day (QID) | ORAL | Status: DC | PRN

## 2023-05-17 MED ORDER — FUROSEMIDE 20 MG PO TABS: 20.0000 mg | ORAL_TABLET | Freq: Every day | ORAL | Status: DC | PRN

## 2023-05-17 MED ORDER — 0.9 % SODIUM CHLORIDE (POUR BTL) OPTIME
TOPICAL | Status: DC | PRN
  Administered 2023-05-17: 1000 mL

## 2023-05-17 MED ORDER — ORAL CARE MOUTH RINSE: 15.0000 mL | Freq: Once | OROMUCOSAL | Status: AC

## 2023-05-17 MED ORDER — TRANEXAMIC ACID-NACL 1000-0.7 MG/100ML-% IV SOLN
1000.0000 mg | Freq: Once | INTRAVENOUS | Status: AC
  Administered 2023-05-17: 1000 mg via INTRAVENOUS
  Filled 2023-05-17: qty 100

## 2023-05-17 MED ORDER — CEFAZOLIN SODIUM-DEXTROSE 2-4 GM/100ML-% IV SOLN
INTRAVENOUS | Status: AC
  Filled 2023-05-17: qty 100

## 2023-05-17 MED ORDER — GLYCOPYRROLATE PF 0.2 MG/ML IJ SOSY
PREFILLED_SYRINGE | INTRAMUSCULAR | Status: AC
  Filled 2023-05-17: qty 1

## 2023-05-17 MED ORDER — ONDANSETRON HCL 4 MG/2ML IJ SOLN
4.0000 mg | INTRAMUSCULAR | Status: DC | PRN
  Administered 2023-05-17: 4 mg via INTRAVENOUS
  Filled 2023-05-17: qty 2

## 2023-05-17 MED ORDER — ENOXAPARIN SODIUM 40 MG/0.4ML IJ SOSY
40.0000 mg | PREFILLED_SYRINGE | INTRAMUSCULAR | Status: DC
  Administered 2023-05-18: 40 mg via SUBCUTANEOUS
  Filled 2023-05-17: qty 0.4

## 2023-05-17 MED ORDER — MUPIROCIN 2 % EX OINT
TOPICAL_OINTMENT | Freq: Two times a day (BID) | CUTANEOUS | Status: DC
  Filled 2023-05-17 (×2): qty 22

## 2023-05-17 MED ORDER — CHLORHEXIDINE GLUCONATE 0.12 % MT SOLN: 15.0000 mL | Freq: Once | OROMUCOSAL | Status: AC

## 2023-05-17 MED ORDER — PHENYLEPHRINE 80 MCG/ML (10ML) SYRINGE FOR IV PUSH (FOR BLOOD PRESSURE SUPPORT)
PREFILLED_SYRINGE | INTRAVENOUS | Status: DC | PRN
  Administered 2023-05-17: 80 ug via INTRAVENOUS
  Administered 2023-05-17 (×3): 160 ug via INTRAVENOUS
  Administered 2023-05-17: 240 ug via INTRAVENOUS

## 2023-05-17 MED ORDER — LIDOCAINE 2% (20 MG/ML) 5 ML SYRINGE
INTRAMUSCULAR | Status: AC
  Filled 2023-05-17: qty 5

## 2023-05-17 MED ORDER — VANCOMYCIN HCL IN DEXTROSE 1-5 GM/200ML-% IV SOLN
1000.0000 mg | Freq: Two times a day (BID) | INTRAVENOUS | Status: AC
  Administered 2023-05-18: 1000 mg via INTRAVENOUS
  Filled 2023-05-17: qty 200

## 2023-05-17 MED ORDER — CHLORHEXIDINE GLUCONATE 0.12 % MT SOLN
OROMUCOSAL | Status: AC
  Administered 2023-05-17: 15 mL via OROMUCOSAL
  Filled 2023-05-17: qty 15

## 2023-05-17 MED ORDER — MORPHINE SULFATE (PF) 2 MG/ML IV SOLN
0.5000 mg | INTRAVENOUS | Status: DC | PRN
  Administered 2023-05-17 – 2023-05-21 (×2): 1 mg via INTRAVENOUS
  Filled 2023-05-17 (×2): qty 1

## 2023-05-17 MED ORDER — OXYCODONE-ACETAMINOPHEN 5-325 MG PO TABS
1.0000 | ORAL_TABLET | ORAL | Status: DC | PRN
  Administered 2023-05-18 – 2023-05-20 (×4): 1 via ORAL
  Filled 2023-05-17 (×5): qty 1

## 2023-05-17 MED ORDER — DEXAMETHASONE SODIUM PHOSPHATE 10 MG/ML IJ SOLN
INTRAMUSCULAR | Status: DC | PRN
  Administered 2023-05-17: 5 mg via INTRAVENOUS

## 2023-05-17 MED ORDER — PHENOL 1.4 % MT LIQD: 1.0000 | OROMUCOSAL | Status: DC | PRN

## 2023-05-17 MED ORDER — ROCURONIUM BROMIDE 10 MG/ML (PF) SYRINGE
PREFILLED_SYRINGE | INTRAVENOUS | Status: DC | PRN
  Administered 2023-05-17: 50 mg via INTRAVENOUS

## 2023-05-17 MED ORDER — DEXAMETHASONE SODIUM PHOSPHATE 10 MG/ML IJ SOLN
INTRAMUSCULAR | Status: AC
  Filled 2023-05-17: qty 1

## 2023-05-17 MED ORDER — ENSURE ENLIVE PO LIQD
237.0000 mL | Freq: Two times a day (BID) | ORAL | Status: DC
  Administered 2023-05-17 – 2023-05-21 (×8): 237 mL via ORAL

## 2023-05-17 MED ORDER — CHLORHEXIDINE GLUCONATE 4 % EX SOLN: 60.0000 mL | Freq: Once | CUTANEOUS | Status: DC

## 2023-05-17 MED ORDER — POVIDONE-IODINE 10 % EX SWAB: 2.0000 | Freq: Once | CUTANEOUS | Status: DC

## 2023-05-17 MED ORDER — ACETAMINOPHEN 325 MG PO TABS: 650.0000 mg | ORAL_TABLET | ORAL | Status: DC | PRN

## 2023-05-17 MED ORDER — TRANEXAMIC ACID-NACL 1000-0.7 MG/100ML-% IV SOLN
INTRAVENOUS | Status: AC
  Filled 2023-05-17: qty 100

## 2023-05-17 MED ORDER — EMPAGLIFLOZIN 10 MG PO TABS
10.0000 mg | ORAL_TABLET | Freq: Every day | ORAL | Status: DC
  Filled 2023-05-17 (×2): qty 1

## 2023-05-17 MED ORDER — LOSARTAN POTASSIUM 25 MG PO TABS
12.5000 mg | ORAL_TABLET | Freq: Every day | ORAL | Status: DC
  Administered 2023-05-17 – 2023-05-21 (×5): 12.5 mg via ORAL
  Filled 2023-05-17 (×5): qty 0.5

## 2023-05-17 MED ORDER — METOCLOPRAMIDE HCL 5 MG/ML IJ SOLN: 5.0000 mg | Freq: Three times a day (TID) | INTRAMUSCULAR | Status: DC | PRN

## 2023-05-17 MED ORDER — BISACODYL 10 MG RE SUPP: 10.0000 mg | Freq: Every day | RECTAL | Status: DC | PRN

## 2023-05-17 MED ORDER — MAGNESIUM HYDROXIDE 400 MG/5ML PO SUSP: 30.0000 mL | Freq: Every day | ORAL | Status: DC | PRN

## 2023-05-17 MED ORDER — METHOCARBAMOL 1000 MG/10ML IJ SOLN: 500.0000 mg | Freq: Four times a day (QID) | INTRAMUSCULAR | Status: DC | PRN

## 2023-05-17 MED ORDER — VANCOMYCIN HCL IN DEXTROSE 1-5 GM/200ML-% IV SOLN
INTRAVENOUS | Status: AC
  Filled 2023-05-17: qty 200

## 2023-05-17 MED ORDER — ANASTROZOLE 1 MG PO TABS
1.0000 mg | ORAL_TABLET | Freq: Every day | ORAL | Status: DC
  Administered 2023-05-17 – 2023-05-21 (×5): 1 mg via ORAL
  Filled 2023-05-17 (×6): qty 1

## 2023-05-17 MED ORDER — INSULIN ASPART 100 UNIT/ML IJ SOLN: 0.0000 [IU] | INTRAMUSCULAR | Status: DC | PRN

## 2023-05-17 MED ORDER — VANCOMYCIN HCL IN DEXTROSE 1-5 GM/200ML-% IV SOLN
1000.0000 mg | INTRAVENOUS | Status: AC
  Administered 2023-05-17: 1000 mg via INTRAVENOUS

## 2023-05-17 MED ORDER — ONDANSETRON HCL 4 MG/2ML IJ SOLN
INTRAMUSCULAR | Status: DC | PRN
  Administered 2023-05-17: 4 mg via INTRAVENOUS

## 2023-05-17 MED ORDER — TRAZODONE HCL 50 MG PO TABS
25.0000 mg | ORAL_TABLET | Freq: Every evening | ORAL | Status: DC | PRN
  Administered 2023-05-20: 25 mg via ORAL
  Filled 2023-05-17: qty 1

## 2023-05-17 MED ORDER — MIDAZOLAM HCL 2 MG/2ML IJ SOLN
INTRAMUSCULAR | Status: AC
  Filled 2023-05-17: qty 2

## 2023-05-17 MED ORDER — LACTATED RINGERS IV SOLN: INTRAVENOUS | Status: DC

## 2023-05-17 MED ORDER — HYDROCODONE-ACETAMINOPHEN 5-325 MG PO TABS
1.0000 | ORAL_TABLET | ORAL | Status: DC | PRN
  Administered 2023-05-19 – 2023-05-21 (×3): 2 via ORAL
  Filled 2023-05-17 (×3): qty 2

## 2023-05-17 MED ORDER — ONDANSETRON HCL 4 MG/2ML IJ SOLN
INTRAMUSCULAR | Status: AC
  Filled 2023-05-17: qty 2

## 2023-05-17 SURGICAL SUPPLY — 29 items
BAG COUNTER SPONGE SURGICOUNT (BAG) ×1 IMPLANT
BIT DRILL 4.9 CANNULATED (BIT) ×1
BIT DRILL CANN QC 4.9 LRG (BIT) IMPLANT
BNDG COHESIVE 4X5 TAN STRL (GAUZE/BANDAGES/DRESSINGS) ×1 IMPLANT
BNDG GAUZE DERMACEA FLUFF 4 (GAUZE/BANDAGES/DRESSINGS) ×1 IMPLANT
CLSR STERI-STRIP ANTIMIC 1/2X4 (GAUZE/BANDAGES/DRESSINGS) IMPLANT
COVER PERINEAL POST (MISCELLANEOUS) ×1 IMPLANT
COVER SURGICAL LIGHT HANDLE (MISCELLANEOUS) ×1 IMPLANT
DRAPE STERI IOBAN 125X83 (DRAPES) ×1 IMPLANT
DRESSING MEPILEX FLEX 4X4 (GAUZE/BANDAGES/DRESSINGS) ×1 IMPLANT
DRSG MEPILEX FLEX 4X4 (GAUZE/BANDAGES/DRESSINGS) ×1
DURAPREP 26ML APPLICATOR (WOUND CARE) ×1 IMPLANT
ELECT REM PT RETURN 15FT ADLT (MISCELLANEOUS) ×1 IMPLANT
GLOVE BIO SURGEON STRL SZ7.5 (GLOVE) ×2 IMPLANT
GLOVE BIOGEL PI IND STRL 7.5 (GLOVE) ×1 IMPLANT
GLOVE BIOGEL PI IND STRL 8 (GLOVE) ×1 IMPLANT
GOWN STRL REUS W/ TWL LRG LVL3 (GOWN DISPOSABLE) ×3 IMPLANT
GUIDEWIRE ASNIS 3.2 NONCAL (WIRE) IMPLANT
KIT BASIN OR (CUSTOM PROCEDURE TRAY) ×1 IMPLANT
KIT TURNOVER KIT A (KITS) ×1 IMPLANT
MANIFOLD NEPTUNE II (INSTRUMENTS) ×1 IMPLANT
NS IRRIG 1000ML POUR BTL (IV SOLUTION) ×1 IMPLANT
PACK GENERAL/GYN (CUSTOM PROCEDURE TRAY) ×1 IMPLANT
PAD ARMBOARD 7.5X6 YLW CONV (MISCELLANEOUS) ×2 IMPLANT
SCREW CANN 6.5X80 NONSTRL (Screw) IMPLANT
SCREW CANN P/T ASNIS 6.5X75 (Screw) IMPLANT
SUT MON AB 2-0 CT1 36 (SUTURE) ×1 IMPLANT
SUT VIC AB 0 CT1 27XBRD ANBCTR (SUTURE) IMPLANT
WATER STERILE IRR 1000ML POUR (IV SOLUTION) ×1 IMPLANT

## 2023-05-17 NOTE — Assessment & Plan Note (Signed)
-   We will continue statin therapy that should also provide perioperative cardiovascular risk reduction.

## 2023-05-17 NOTE — Progress Notes (Signed)
Initial Nutrition Assessment  DOCUMENTATION CODES:   Non-severe (moderate) malnutrition in context of chronic illness  INTERVENTION:  Ensure Enlive po BID, each supplement provides 350 kcal and 20 grams of protein. Provided brief education on importance of protein intake and adequate calorie intake post-op Recommend room service with assist  Documented food preferences  NUTRITION DIAGNOSIS:   Moderate Malnutrition related to chronic illness as evidenced by mild fat depletion, moderate fat depletion, moderate muscle depletion, mild muscle depletion.  GOAL:   Patient will meet greater than or equal to 90% of their needs  MONITOR:   PO intake, Diet advancement, Supplement acceptance, Weight trends  REASON FOR ASSESSMENT:   Consult Assessment of nutrition requirement/status  ASSESSMENT:   Pt admitted for closed left hip fracture after a fall at home. Pt with PMH of atrial fibrillation on Eliquis, DM2, HLD, CHF, breast cancer s/p modified radical mastectomy, OSA.   2/13: Scheduled left hip pinning  Pt NPO for surgery today. Pt reports appetite has been poor the past week due to nausea and vomiting. She reports only having small bites of food over this time period. She states she is now hungry and excited to start eating when cleared to do so. Pt reports her usual intake consists of 3 meals a day, which include B: eggs, sausage, toast. L: sandwich. D: some kind of packaged/ready to eat meal. She reports that she does not usually cook at home and currently lives by herself. She states that her niece will sometimes bring her dinner but she doesn't always like what she cooks. Pt reports that she thinks she would like chocolate ensure and would like to try some during admission.Discussed benefit of drinking protein shakes at home to ensure adequate protein intake post-surgery. She states she will be moving in with her niece in about a month which she is excited about. Pt reports she got  treatment for breast cancer back in 2019 (mastectomy and radiation) and has lost weight since that time due to a decrease in her appetite. Pt reports she is also still on an oral chemo medication. Pt reports she was happy that she lost weight. Pt reports she takes some vitamins at home such as iron and a bowel regimen PRN but is unsure of what other vitamins she takes. Per chart review she takes vitamin D3 and calcium. Pt does have poor dentition but does not have any issues chewing/swallowing except for hard nuts.  Pt reports last Wednesday was her last BM and described it as "little balls" and had diarrhea prior to that day. Per chart review she has a documented BM yesterday (2/12).  Pt reports she used to be over 200 lbs but since 2019 has since lost weight. Pt currently weighs 141 lbs and per chart review has been between 138-149 lbs over the last year. Weights have been consistent over the past few months.  Meds: calcium carbonate, vitamin D3, Jardiance, Novolog 0-9 units Q4H, Lasix PRN, Magnesium hydroxide PRN  Labs: CBGs range from 137-143 today, Creatinine 1.19, A1C 5.9  Admit weight: 64 kg  NUTRITION - FOCUSED PHYSICAL EXAM:  Flowsheet Row Most Recent Value  Orbital Region Mild depletion  Upper Arm Region No depletion  [left arm wrapped/bandaged]  Thoracic and Lumbar Region No depletion  Buccal Region Moderate depletion  Temple Region Moderate depletion  Clavicle Bone Region Mild depletion  Clavicle and Acromion Bone Region Mild depletion  Scapular Bone Region Mild depletion  Dorsal Hand Mild depletion  [left hand wrapped/bandaged]  Patellar Region Moderate depletion  Anterior Thigh Region Moderate depletion  Posterior Calf Region Unable to assess  Edema (RD Assessment) Mild  Hair Reviewed  Eyes Reviewed  Mouth Other (Comment)  [dry tongue,  few teeth]  Skin Reviewed  Nails Reviewed       Diet Order:   Diet Order             Diet NPO time specified Except for: Sips  with Meds, Ice Chips  Diet effective now                   EDUCATION NEEDS:   Education needs have been addressed  Skin:  Skin Assessment: Reviewed RN Assessment  Last BM:  2/12  Height:   Ht Readings from Last 1 Encounters:  05/17/23 5\' 4"  (1.626 m)    Weight:   Wt Readings from Last 1 Encounters:  05/17/23 64 kg    Ideal Body Weight:  54.5 kg  BMI:  Body mass index is 24.2 kg/m.  Estimated Nutritional Needs:   Kcal:  1600-1800  Protein:  75-90 g  Fluid:  1.6-1.8 L    Maceo Pro, MS Dietetic Intern

## 2023-05-17 NOTE — Progress Notes (Signed)
Orthopedic Tech Progress Note Patient Details:  KINSLY HILD 10-Jan-1956 [id number] Dropped of wrist splint to OR desk Ortho Devices Type of Ortho Device: Wrist splint Ortho Device/Splint Location: LUE Ortho Device/Splint Interventions: Ordered      Bella Kennedy A Latravis Grine 05/17/2023, 3:32 PM

## 2023-05-17 NOTE — H&P (Addendum)
[place]   PATIENT NAME: Lisa Thornton    MR#:  [id number]  DATE OF BIRTH:  03-04-1956  DATE OF ADMISSION:  05/16/2023  PRIMARY CARE PHYSICIAN: Anabel Halon, MD   Patient is coming from: Home  REQUESTING/REFERRING PHYSICIAN: Gloris Manchester, MD  CHIEF COMPLAINT:   Chief Complaint  Patient presents with   Fall    HISTORY OF PRESENT ILLNESS:  JOCI DRESS is a 68 y.o. Caucasian female with medical history significant for paroxysmal atrial fibrillation on Eliquis with last dose was 2 days ago, type 2 diabetes mellitus, dyslipidemia, CHF, left breast cancer status post modified radical mastectomy, OSA and essential hypertension, who presented to the emergency room with acute onset of left hip and wrist pain after having an accidental mechanical fall.  Patient tripped over her medications bag and was having mild lightheadedness and dizziness but denied any syncope before she fell.  She denied any fever or chills.  No nausea or vomiting or abdominal pain.  No bleeding diathesis.  She denied any head injuries however after falling on her left side she was having left upper extremity as well as left hip pain.  She admits to recent dysuria without hematuria, urgency or frequency or flank pain.  No paresthesias or focal muscle weakness.  No chest pain or palpitations.  No cough or wheezing or dyspnea.  ED Course: When she came to the ER, BP was 114/55 with otherwise normal vital signs.  Labs revealed a creatinine 1.19 compared to 1.14 in October 2024 and blood glucose of 177.  CBC showed leukocytosis of 16.1 with neutrophilia.   Imaging:  Portable chest x-ray showed streaky atelectasis or scar at the left base.   Left hip x-ray showed impacted subcapital left femoral neck fracture. Left wrist x-ray showed possible subtle slightly impacted nondisplaced distal radius fracture. EKG as reviewed by me : EKG showed atrial fibrillation with rapid ventricular sponsor 118 with PVCs.  The patient  was given 1 p.o. Percocet.  Dr. Eulah Pont was notified about the patient and recommended transfer to [facility].  She will be admitted to a medical monitored bed for further evaluation and management.  PAST MEDICAL HISTORY:   Past Medical History:  Diagnosis Date   Anemia 12/31/2017   Cancer (HCC)    left breast cancer   Chest pain    Diabetes mellitus without complication (HCC)    Hypertension     PAST SURGICAL HISTORY:   Past Surgical History:  Procedure Laterality Date   COLONOSCOPY WITH PROPOFOL N/A 09/27/2021   Procedure: COLONOSCOPY WITH PROPOFOL;  Surgeon: Dolores Frame, MD;  Location: [facility];  Service: Gastroenterology;  Laterality: N/A;  945   MASTECTOMY MODIFIED RADICAL Left 12/31/2017   Procedure: LEFT MODIFIED RADICAL MASTECTOMY;  Surgeon: Franky Macho, MD;  Location: [facility];  Service: General;  Laterality: Left;   POLYPECTOMY  09/27/2021   Procedure: POLYPECTOMY;  Surgeon: Dolores Frame, MD;  Location: [facility];  Service: Gastroenterology;;   PORTACATH PLACEMENT Right 06/27/2017   Procedure: INSERTION PORT-A-CATH;  Surgeon: Franky Macho, MD;  Location: [facility];  Service: General;  Laterality: Right;    SOCIAL HISTORY:   Social History   Tobacco Use   Smoking status: Never   Smokeless tobacco: Never  Substance Use Topics   Alcohol use: No    FAMILY HISTORY:   Family History  Problem Relation Age of Onset   Breast cancer Mother    Thyroid disease Mother  Heart disease Mother    Heart attack Father    Heart attack Sister    Hypertension Sister    Heart attack Brother    Cancer Sister    Stroke Brother    Alzheimer's disease Maternal Aunt     DRUG ALLERGIES:  No Known Allergies  REVIEW OF SYSTEMS:   ROS As per history of present illness. All pertinent systems were reviewed above. Constitutional, HEENT, cardiovascular, respiratory, GI, GU, musculoskeletal, neuro, psychiatric, endocrine, integumentary and hematologic systems  were reviewed and are otherwise negative/unremarkable except for positive findings mentioned above in the HPI.   MEDICATIONS AT HOME:   Prior to Admission medications   Medication Sig Start Date End Date Taking? Authorizing Provider  Accu-Chek Softclix Lancets lancets USE 1  TO CHECK GLUCOSE UP TO 4 TIMES DAILY 08/16/20   Heather Roberts, NP  anastrozole (ARIMIDEX) 1 MG tablet Take 1 tablet by mouth once daily 03/26/23   Anabel Halon, MD  apixaban (ELIQUIS) 5 MG TABS tablet Take 1 tablet (5 mg total) by mouth 2 (two) times daily. 04/26/22   Anabel Halon, MD  atorvastatin (LIPITOR) 10 MG tablet Take 1 tablet (10 mg total) by mouth at bedtime. 12/07/22   Anabel Halon, MD  blood glucose meter kit and supplies Dispense based on patient and insurance preference. Use up to four times daily as directed. (FOR ICD-10 E10.9, E11.9). 10/29/19   Freddy Finner, NP  calcium carbonate (OS-CAL) 600 MG TABS tablet Take 600 mg by mouth daily with breakfast.    [provider]  cholecalciferol (VITAMIN D3) 25 MCG (1000 UT) tablet Take 1,000 Units by mouth daily.    [provider]  empagliflozin (JARDIANCE) 10 MG TABS tablet Take 1 tablet (10 mg total) by mouth daily. 11/28/22   Strader, Lennart Pall, PA-C  furosemide (LASIX) 20 MG tablet Take 1 tablet (20 mg total) by mouth daily as needed for fluid or edema. 11/28/22   Strader, Lennart Pall, PA-C  glucose blood (ACCU-CHEK GUIDE) test strip USE 1 STRIP TO CHECK GLUCOSE THREE TIMES DAILY MORNING,  NOON  AND  AT  BEDTIME  AS  DIRECTED 01/02/23   Anabel Halon, MD  losartan (COZAAR) 25 MG tablet Take 0.5 tablets (12.5 mg total) by mouth daily. 11/28/22   Strader, Lennart Pall, PA-C  magnesium oxide (MAG-OX) 400 (240 Mg) MG tablet Take 1 tablet (400 mg total) by mouth daily. 07/10/22   Catarina Hartshorn, MD  metFORMIN (GLUCOPHAGE) 1000 MG tablet Take 1 tablet (1,000 mg total) by mouth daily with breakfast. 09/06/22   Anabel Halon, MD  metoprolol succinate  (TOPROL-XL) 100 MG 24 hr tablet Take 1 tablet (100 mg total) by mouth 2 (two) times daily. Take with or immediately following a meal. 01/19/23 04/27/23  Strader, Lennart Pall, PA-C  ondansetron (ZOFRAN) 4 MG tablet Take 1 tablet (4 mg total) by mouth every 8 (eight) hours as needed for nausea or vomiting. 11/29/22   Anabel Halon, MD  Semaglutide (RYBELSUS) 7 MG TABS Take 1 tablet (7 mg total) by mouth daily. 01/03/23   Anabel Halon, MD  sulfamethoxazole-trimethoprim (BACTRIM DS) 800-160 MG tablet Take 1 tablet by mouth 2 (two) times daily. 05/02/23   Anabel Halon, MD  UNABLE TO FIND 1 each by Does not apply route daily. Med Name: Shower Chair 08/16/22   Anabel Halon, MD  UNABLE TO FIND 1 each by Does not apply route daily. Med Name: Bed side  Commode to fix over toilet seat 08/24/22   Anabel Halon, MD      VITAL SIGNS:  Blood pressure 101/65, pulse 65, temperature 97.7 F (36.5 C), temperature source Oral, resp. rate 16, height 5\' 4"  (1.626 m), weight 64 kg, SpO2 97%.  PHYSICAL EXAMINATION:  Physical Exam  GENERAL:  68 y.o.-year-old Caucasian female patient lying in the bed with no acute distress.  EYES: Pupils equal, round, reactive to light and accommodation. No scleral icterus. Extraocular muscles intact.  HEENT: Head atraumatic, normocephalic. Oropharynx and nasopharynx clear.  NECK:  Supple, no jugular venous distention. No thyroid enlargement, no tenderness.  LUNGS: Normal breath sounds bilaterally, no wheezing, rales,rhonchi or crepitation. No use of accessory muscles of respiration.  CARDIOVASCULAR: Regular rate and rhythm, S1, S2 normal. No murmurs, rubs, or gallops.  ABDOMEN: Soft, nondistended, nontender. Bowel sounds present. No organomegaly or mass.  EXTREMITIES: No pedal edema, cyanosis, or clubbing.  NEUROLOGIC: Cranial nerves II through XII are intact. Muscle strength 5/5 in all extremities. Sensation intact. Gait not checked. Musculoskeletal: Left lateral hip  tenderness.  Left wrist is in a long-arm splint. PSYCHIATRIC: The patient is alert and oriented x 3.  Normal affect and good eye contact. SKIN: No obvious rash, lesion, or ulcer.   LABORATORY PANEL:   CBC Recent Labs  Lab 05/16/23 2138  WBC 16.1*  HGB 14.2  HCT 40.4  PLT 227   ------------------------------------------------------------------------------------------------------------------  Chemistries  Recent Labs  Lab 05/16/23 2138  NA 136  K 4.2  CL 100  CO2 22  GLUCOSE 177*  BUN 23  CREATININE 1.19*  CALCIUM 10.3   ------------------------------------------------------------------------------------------------------------------  Cardiac Enzymes No results for input(s): "TROPONINI" in the last 168 hours. ------------------------------------------------------------------------------------------------------------------  RADIOLOGY:  DG Wrist Complete Left Result Date: 05/16/2023 CLINICAL DATA:  Fall onto left side EXAM: LEFT WRIST - COMPLETE 3+ VIEW COMPARISON:  None Available. FINDINGS: Possible subtle slightly impacted nondisplaced distal radius fracture. No subluxation. Positive for wrist soft tissue swelling. Mild degenerative change at the first MTP joint. IMPRESSION: Possible subtle slightly impacted nondisplaced distal radius fracture. Electronically Signed   By: Jasmine Pang M.D.   On: 05/16/2023 21:52   DG Hip Unilat With Pelvis 2-3 Views Left Result Date: 05/16/2023 CLINICAL DATA:  Larey Seat onto left side EXAM: DG HIP (WITH OR WITHOUT PELVIS) 2-3V LEFT COMPARISON:  None Available. FINDINGS: Frontal view of the pelvis as well as frontal and cross-table lateral views of the left hip are obtained. There is an impacted subcapital left femoral neck fracture, with near anatomic alignment. No dislocation. Right hip is unremarkable. The remainder of the bony pelvis is normal. IMPRESSION: 1. Impacted subcapital left femoral neck fracture. Electronically Signed   By: Sharlet Salina M.D.   On: 05/16/2023 21:51   DG Chest Port 1 View Result Date: 05/16/2023 CLINICAL DATA:  Fall onto left side EXAM: PORTABLE CHEST 1 VIEW COMPARISON:  11/13/2022. FINDINGS: Right-sided central venous port tip at the upper SVC. Right lung grossly clear. Stable cardiomediastinal silhouette. Streaky atelectasis or scarring at the left base. Clips in the left axilla. Old right-sided rib fractures screw IMPRESSION: Streaky atelectasis or scar at left base. Electronically Signed   By: Jasmine Pang M.D.   On: 05/16/2023 21:51      IMPRESSION AND PLAN:  Assessment and Plan: * Closed left hip fracture (HCC) - This is secondary to mechanical fall. - The patient was admitted to a telemetry bed. - Pain management will be provided. - Orthopedic consult will be  obtained. - Dr. Eulah Pont was notified and is aware about the patient. - The patient has a history of diabetes mellitus but is not on insulin.  She has no history of CVA, coronary artery disease, CHF or renal failure with a creatinine more than 2.  She is considered at average risk for her age for perioperative cardiovascular risk events per the revised cardiac risk index. - Her Eliquis was not taken for 2 days. - She has no current pulmonary issues. - She has been kept n.p.o. after midnight.  Dysuria - She finished a course of Bactrim DS for recent UTI. - Will repeat urinalysis.  Dyslipidemia - We will continue statin therapy that should also provide perioperative cardiovascular risk reduction.  Essential hypertension - We will continue antihypertensive therapy.  That includes Toprol-XL that should provide perioperative cardiovascular risk reduction.  Type 2 diabetes mellitus with chronic kidney disease, with long-term current use of insulin (HCC) - She has stage IIIa chronic kidney disease. - She will be placed on supplement coverage with NovoLog. - We will hold off metformin. - We will continue Jardiance.  Breast cancer of  upper-outer quadrant of left female breast (HCC) - We will continue Arimidex.       DVT prophylaxis: SCDs.  Medical prophylaxis is postponed till postoperative period.  The patient's Eliquis was held off for couple of days as mentioned above. Advanced Care Planning:  Code Status: full code. Family Communication:  The plan of care was discussed in details with the patient (and family). I answered all questions. The patient agreed to proceed with the above mentioned plan. Further management will depend upon hospital course. Disposition Plan: Back to previous home environment Consults called: Orthopedic consult. All the records are reviewed and case discussed with ED provider.  Status is: Inpatient   At the time of the admission, it appears that the appropriate admission status for this patient is inpatient.  This is judged to be reasonable and necessary in order to provide the required intensity of service to ensure the patient's safety given the presenting symptoms, physical exam findings and initial radiographic and laboratory data in the context of comorbid conditions.  The patient requires inpatient status due to high intensity of service, high risk of further deterioration and high frequency of surveillance required.  I certify that at the time of admission, it is my clinical judgment that the patient will require inpatient hospital care extending more than 2 midnights.                            Dispo: The patient is from: Home              Anticipated d/c is to: Home              Patient currently is not medically stable to d/c.              Difficult to place patient: No  Hannah Beat M.D on 05/17/2023 at 4:48 AM  [facility]   From 7 PM-7 AM, contact night-coverage [email or web address]  CC: Primary care physician; Anabel Halon, MD

## 2023-05-17 NOTE — Interval H&P Note (Signed)
History and Physical Interval Note:  05/17/2023 2:18 PM  Lisa Thornton  has presented today for surgery, with the diagnosis of left hip fracture.  The various methods of treatment have been discussed with the patient and family. After consideration of risks, benefits and other options for treatment, the patient has consented to  Procedure(s): LEFT HIP PINNING (Left) as a surgical intervention.  The patient's history has been reviewed, patient examined, no change in status, stable for surgery.  I have reviewed the patient's chart and labs.  Questions were answered to the patient's satisfaction.     Sheral Apley

## 2023-05-17 NOTE — Anesthesia Postprocedure Evaluation (Signed)
Anesthesia Post Note  Patient: Lisa Thornton  Procedure(s) Performed: LEFT HIP PINNING (Left)     Patient location during evaluation: PACU Anesthesia Type: General Level of consciousness: awake and alert Pain management: pain level controlled Vital Signs Assessment: post-procedure vital signs reviewed and stable Respiratory status: spontaneous breathing, nonlabored ventilation, respiratory function stable and patient connected to nasal cannula oxygen Cardiovascular status: blood pressure returned to baseline and stable Postop Assessment: no apparent nausea or vomiting Anesthetic complications: no   No notable events documented.  Last Vitals:  Vitals:   05/17/23 1600 05/17/23 1633  BP: 107/68 123/74  Pulse: 92 97  Resp: 12 18  Temp:  37.1 C  SpO2: 93% 95%    Last Pain:  Vitals:   05/17/23 1600  TempSrc:   PainSc: 0-No pain                 [place] Nation

## 2023-05-17 NOTE — Anesthesia Procedure Notes (Signed)
Procedure Name: Intubation Date/Time: 05/17/2023 2:39 PM  Performed by: April Holding, CRNAPre-anesthesia Checklist: Patient identified, Emergency Drugs available, Suction available and Patient being monitored Patient Re-evaluated:Patient Re-evaluated prior to induction Oxygen Delivery Method: Circle System Utilized Preoxygenation: Pre-oxygenation with 100% oxygen Induction Type: IV induction Ventilation: Mask ventilation without difficulty Laryngoscope Size: Miller and 2 Grade View: Grade I Tube type: Oral Tube size: 7.0 mm Number of attempts: 1 Airway Equipment and Method: Stylet and Oral airway Placement Confirmation: ETT inserted through vocal cords under direct vision, positive ETCO2 and breath sounds checked- equal and bilateral Secured at: 21 cm Tube secured with: Tape Dental Injury: Teeth and Oropharynx as per pre-operative assessment

## 2023-05-17 NOTE — Progress Notes (Signed)
PROGRESS NOTE    Lisa Thornton  WGN:[id number] DOB: 17-Sep-1955 DOA: 05/16/2023 PCP: Anabel Halon, MD   Brief Narrative: LANEYA Thornton is a 68 y.o. female with a history of paroxysmal atrial fibrillation, breast cancer status post modified radical mastectomy, OSA, primary hypertension.  Patient presented secondary to fall and resultant acute left hip and wrist pain and was found to have evidence of a left hip fracture in addition to a left distal radius fracture.  Orthopedic surgery consulted for surgical management.   Assessment and Plan:  Left hip fracture Secondary to fall.  Unilateral hip and pelvis x-ray on admission significant for an impacted subcapital left femoral neck fracture.  Orthopedic surgery consulted with recommendations for operative management.  Left wrist fracture Secondary to fall.  Wrist x-ray on admission significant for a possible subtle slightly impacted nondisplaced distal radius fracture.  Orthopedic surgery recommendations for nonoperative management with splint.  Recommendation for nonweightbearing, however okay to weight-bear as tolerated through elbow.  Paroxysmal atrial fibrillation Patient is in sinus rhythm.  Patient is managed on Eliquis and Toprol-XL as an outpatient.  Eliquis held on admission. -Continue Toprol-XL -Resume Eliquis pending orthopedic surge recommendations postoperatively  Leukocytosis Likely reactive secondary to acute fracture.  Dysuria Patient recently treated with Bactrim DS.  Urinalysis ordered and is still pending.  Primary hypertension Patient is on losartan and metoprolol succinate as an outpatient. -Continue metoprolol succinate and losartan  Diabetes mellitus type 2 Well-controlled with hemoglobin A1c of 5.9%. Patient is on Jardiance, metformin, semaglutide as an outpatient.  Hold outpatient regimen. -SSI  Hyperlipidemia -Continue Lipitor   DVT prophylaxis: Lovenox Code Status:   Code Status: Full Code Family  Communication: None at bedside Disposition Plan: Discharge pending orthopedic surgery recommendations in addition to upcoming PT/OT recommendations   Consultants:  Orthopedic surgery  Procedures:  None  Antimicrobials: None   Subjective: Patient reports no specific concerns except she is thirsty.  Objective: BP 107/65 (BP Location: Right Arm)   Pulse 98   Temp 98.2 F (36.8 C) (Oral)   Resp 17   Ht 5\' 4"  (1.626 m)   Wt 64 kg   SpO2 96%   BMI 24.20 kg/m   Examination:  General exam: Appears calm and comfortable Respiratory system: Clear to auscultation. Respiratory effort normal. Cardiovascular system: S1 & S2 heard, RRR.  Gastrointestinal system: Abdomen is nondistended, soft and nontender. Normal bowel sounds heard. Central nervous system: Alert and oriented. No focal neurological deficits. Musculoskeletal: 1+ BLE pitting edema. No calf tenderness. Left forearm in splint. Psychiatry: Judgement and insight appear normal. Mood & affect appropriate.    Data Reviewed: I have personally reviewed following labs and imaging studies  CBC Lab Results  Component Value Date   WBC 16.1 (H) 05/16/2023   RBC 4.55 05/16/2023   HGB 14.2 05/16/2023   HCT 40.4 05/16/2023   MCV 88.8 05/16/2023   MCH 31.2 05/16/2023   PLT 227 05/16/2023   MCHC 35.1 05/16/2023   RDW 12.9 05/16/2023   LYMPHSABS 2.3 05/16/2023   MONOABS 0.8 05/16/2023   EOSABS 0.0 05/16/2023   BASOSABS 0.0 05/16/2023     Last metabolic panel Lab Results  Component Value Date   NA 136 05/16/2023   K 4.2 05/16/2023   CL 100 05/16/2023   CO2 22 05/16/2023   BUN 23 05/16/2023   CREATININE 1.19 (H) 05/16/2023   GLUCOSE 177 (H) 05/16/2023   GFRNONAA 49 (L) 05/16/2023   GFRAA >60 10/20/2019   CALCIUM 10.3  05/16/2023   PHOS 3.4 07/06/2022   PROT 7.6 01/23/2023   ALBUMIN 3.5 01/23/2023   LABGLOB 3.8 12/12/2021   AGRATIO 1.1 (L) 12/12/2021   BILITOT 0.8 01/23/2023   ALKPHOS 65 01/23/2023   AST 36  01/23/2023   ALT 24 01/23/2023   ANIONGAP 14 05/16/2023    GFR: Estimated Creatinine Clearance: 38.5 mL/min (A) (by C-G formula based on SCr of 1.19 mg/dL (H)).  Recent Results (from the past 240 hours)  Surgical pcr screen     Status: Abnormal   Collection Time: 05/17/23  2:34 AM   Specimen: Nasal Mucosa; Nasal Swab  Result Value Ref Range Status   MRSA, PCR POSITIVE (A) NEGATIVE Final    Comment: RESULT CALLED TO, READ BACK BY AND VERIFIED WITH: I [email or web address],RN@[id number]  05/17/23 MK    Staphylococcus aureus POSITIVE (A) NEGATIVE Final    Comment: (NOTE) The Xpert SA Assay (FDA approved for NASAL specimens in patients 58 years of age and older), is one component of a comprehensive surveillance program. It is not intended to diagnose infection nor to guide or monitor treatment. Performed at [facility], [address] [place], [place] [address]       Radiology Studies: DG Wrist Complete Left Result Date: 05/16/2023 CLINICAL DATA:  Fall onto left side EXAM: LEFT WRIST - COMPLETE 3+ VIEW COMPARISON:  None Available. FINDINGS: Possible subtle slightly impacted nondisplaced distal radius fracture. No subluxation. Positive for wrist soft tissue swelling. Mild degenerative change at the first MTP joint. IMPRESSION: Possible subtle slightly impacted nondisplaced distal radius fracture. Electronically Signed   By: Jasmine Pang M.D.   On: 05/16/2023 21:52   DG Hip Unilat With Pelvis 2-3 Views Left Result Date: 05/16/2023 CLINICAL DATA:  Larey Seat onto left side EXAM: DG HIP (WITH OR WITHOUT PELVIS) 2-3V LEFT COMPARISON:  None Available. FINDINGS: Frontal view of the pelvis as well as frontal and cross-table lateral views of the left hip are obtained. There is an impacted subcapital left femoral neck fracture, with near anatomic alignment. No dislocation. Right hip is unremarkable. The remainder of the bony pelvis is normal. IMPRESSION: 1. Impacted subcapital left femoral neck fracture.  Electronically Signed   By: Sharlet Salina M.D.   On: 05/16/2023 21:51   DG Chest Port 1 View Result Date: 05/16/2023 CLINICAL DATA:  Fall onto left side EXAM: PORTABLE CHEST 1 VIEW COMPARISON:  11/13/2022. FINDINGS: Right-sided central venous port tip at the upper SVC. Right lung grossly clear. Stable cardiomediastinal silhouette. Streaky atelectasis or scarring at the left base. Clips in the left axilla. Old right-sided rib fractures screw IMPRESSION: Streaky atelectasis or scar at left base. Electronically Signed   By: Jasmine Pang M.D.   On: 05/16/2023 21:51      LOS: 1 day    Jacquelin Hawking, MD [facility] 05/17/2023, 8:49 AM   If 7PM-7AM, please contact night-coverage [email or web address]

## 2023-05-17 NOTE — Discharge Instructions (Signed)

## 2023-05-17 NOTE — Consult Note (Signed)
Reason for Consult:Left hip fx Referring Physician: Jacquelin Hawking Time called: 0745 Time at bedside: 0858   Lisa Thornton is an 68 y.o. female.  HPI: Cardelia slipped and fell at home. She had immediate left hip pain and could not get up. She was brought to the ED where x-rays showed a left hip fx and orthopedic surgery was consulted. She lives at home alone and does not use any assistive devices to ambulate.  Past Medical History:  Diagnosis Date   Anemia 12/31/2017   Cancer (HCC)    left breast cancer   Chest pain    Diabetes mellitus without complication (HCC)    Hypertension     Past Surgical History:  Procedure Laterality Date   COLONOSCOPY WITH PROPOFOL N/A 09/27/2021   Procedure: COLONOSCOPY WITH PROPOFOL;  Surgeon: Dolores Frame, MD;  [facility];  Service: Gastroenterology;  Laterality: N/A;  945   MASTECTOMY MODIFIED RADICAL Left 12/31/2017   Procedure: LEFT MODIFIED RADICAL MASTECTOMY;  Surgeon: Franky Macho, MD;  Location: [facility];  Service: General;  Laterality: Left;   POLYPECTOMY  09/27/2021   Procedure: POLYPECTOMY;  Surgeon: Dolores Frame, MD;  [facility];  Service: Gastroenterology;;   PORTACATH PLACEMENT Right 06/27/2017   Procedure: INSERTION PORT-A-CATH;  Surgeon: Franky Macho, MD;  Location: [facility];  Service: General;  Laterality: Right;    Family History  Problem Relation Age of Onset   Breast cancer Mother    Thyroid disease Mother    Heart disease Mother    Heart attack Father    Heart attack Sister    Hypertension Sister    Heart attack Brother    Cancer Sister    Stroke Brother    Alzheimer's disease Maternal Aunt     Social History:  reports that she has never smoked. She has never used smokeless tobacco. She reports that she does not drink alcohol and does not use drugs.  Allergies: No Known Allergies  Medications: I have reviewed the patient's current medications.  Results for orders placed or  performed during the hospital encounter of 05/16/23 (from the past 48 hours)  Basic metabolic panel     Status: Abnormal   Collection Time: 05/16/23  9:38 PM  Result Value Ref Range   Sodium 136 135 - 145 mmol/L   Potassium 4.2 3.5 - 5.1 mmol/L   Chloride 100 98 - 111 mmol/L   CO2 22 22 - 32 mmol/L   Glucose, Bld 177 (H) 70 - 99 mg/dL    Comment: Glucose reference range applies only to samples taken after fasting for at least 8 hours.   BUN 23 8 - 23 mg/dL   Creatinine, Ser 4.09 (H) 0.44 - 1.00 mg/dL   Calcium 81.1 8.9 - 91.4 mg/dL   GFR, Estimated 49 (L) >60 mL/min    Comment: (NOTE) Calculated using the CKD-EPI Creatinine Equation (2021)    Anion gap 14 5 - 15    Comment: Performed at [facility], [address] [place], [place] [address]  CBC with Differential     Status: Abnormal   Collection Time: 05/16/23  9:38 PM  Result Value Ref Range   WBC 16.1 (H) 4.0 - 10.5 K/uL   RBC 4.55 3.87 - 5.11 MIL/uL   Hemoglobin 14.2 12.0 - 15.0 g/dL   HCT 62.1 30.8 - 65.7 %   MCV 88.8 80.0 - 100.0 fL   MCH 31.2 26.0 - 34.0 pg   MCHC 35.1 30.0 -  36.0 g/dL   RDW 16.1 09.6 - 04.5 %   Platelets 227 150 - 400 K/uL   nRBC 0.0 0.0 - 0.2 %   Neutrophils Relative % 81 %   Neutro Abs 12.9 (H) 1.7 - 7.7 K/uL   Lymphocytes Relative 14 %   Lymphs Abs 2.3 0.7 - 4.0 K/uL   Monocytes Relative 5 %   Monocytes Absolute 0.8 0.1 - 1.0 K/uL   Eosinophils Relative 0 %   Eosinophils Absolute 0.0 0.0 - 0.5 K/uL   Basophils Relative 0 %   Basophils Absolute 0.0 0.0 - 0.1 K/uL   Immature Granulocytes 0 %   Abs Immature Granulocytes 0.07 0.00 - 0.07 K/uL    Comment: Performed at [facility], [address], [place], [place] [address]  Surgical pcr screen     Status: Abnormal   Collection Time: 05/17/23  2:34 AM   Specimen: Nasal Mucosa; Nasal Swab  Result Value Ref Range   MRSA, PCR POSITIVE (A) NEGATIVE    Comment: RESULT CALLED TO, READ BACK BY AND VERIFIED WITH: I AKOETEY,RN@[id number]  05/17/23 MK     Staphylococcus aureus POSITIVE (A) NEGATIVE    Comment: (NOTE) The Xpert SA Assay (FDA approved for NASAL specimens in patients 53 years of age and older), is one component of a comprehensive surveillance program. It is not intended to diagnose infection nor to guide or monitor treatment. Performed at [facility], [address], [place], [place] [address]   Hemoglobin A1c     Status: Abnormal   Collection Time: 05/17/23  5:17 AM  Result Value Ref Range   Hgb A1c MFr Bld 5.9 (H) 4.8 - 5.6 %    Comment: (NOTE) Pre diabetes:          5.7%-6.4%  Diabetes:              >6.4%  Glycemic control for   <7.0% adults with diabetes    Mean Plasma Glucose 122.63 mg/dL    Comment: Performed at [facility], [address], [place], [place] [address]  Glucose, capillary     Status: Abnormal   Collection Time: 05/17/23  5:33 AM  Result Value Ref Range   Glucose-Capillary 137 (H) 70 - 99 mg/dL    Comment: Glucose reference range applies only to samples taken after fasting for at least 8 hours.  Glucose, capillary     Status: Abnormal   Collection Time: 05/17/23  5:54 AM  Result Value Ref Range   Glucose-Capillary 143 (H) 70 - 99 mg/dL    Comment: Glucose reference range applies only to samples taken after fasting for at least 8 hours.    DG Wrist Complete Left Result Date: 05/16/2023 CLINICAL DATA:  Fall onto left side EXAM: LEFT WRIST - COMPLETE 3+ VIEW COMPARISON:  None Available. FINDINGS: Possible subtle slightly impacted nondisplaced distal radius fracture. No subluxation. Positive for wrist soft tissue swelling. Mild degenerative change at the first MTP joint. IMPRESSION: Possible subtle slightly impacted nondisplaced distal radius fracture. Electronically Signed   By: Jasmine Pang M.D.   On: 05/16/2023 21:52   DG Hip Unilat With Pelvis 2-3 Views Left Result Date: 05/16/2023 CLINICAL DATA:  Larey Seat onto left side EXAM: DG HIP (WITH OR WITHOUT PELVIS) 2-3V LEFT COMPARISON:   None Available. FINDINGS: Frontal view of the pelvis as well as frontal and cross-table lateral views of the left hip are obtained. There is an impacted subcapital left femoral neck fracture, with near anatomic alignment. No  dislocation. Right hip is unremarkable. The remainder of the bony pelvis is normal. IMPRESSION: 1. Impacted subcapital left femoral neck fracture. Electronically Signed   By: Sharlet Salina M.D.   On: 05/16/2023 21:51   DG Chest Port 1 View Result Date: 05/16/2023 CLINICAL DATA:  Fall onto left side EXAM: PORTABLE CHEST 1 VIEW COMPARISON:  11/13/2022. FINDINGS: Right-sided central venous port tip at the upper SVC. Right lung grossly clear. Stable cardiomediastinal silhouette. Streaky atelectasis or scarring at the left base. Clips in the left axilla. Old right-sided rib fractures screw IMPRESSION: Streaky atelectasis or scar at left base. Electronically Signed   By: Jasmine Pang M.D.   On: 05/16/2023 21:51    Review of Systems  HENT:  Negative for ear discharge, ear pain, hearing loss and tinnitus.   Eyes:  Negative for photophobia and pain.  Respiratory:  Negative for cough and shortness of breath.   Cardiovascular:  Negative for chest pain.  Gastrointestinal:  Negative for abdominal pain, nausea and vomiting.  Genitourinary:  Negative for dysuria, flank pain, frequency and urgency.  Musculoskeletal:  Positive for arthralgias (Left hip and wrist). Negative for back pain, myalgias and neck pain.  Neurological:  Negative for dizziness and headaches.  Hematological:  Does not bruise/bleed easily.  Psychiatric/Behavioral:  The patient is not nervous/anxious.    Blood pressure 107/65, pulse 98, temperature 98.2 F (36.8 C), temperature source Oral, resp. rate 17, height 5\' 4"  (1.626 m), weight 64 kg, SpO2 96%. Physical Exam Constitutional:      General: She is not in acute distress.    Appearance: She is well-developed. She is not diaphoretic.  HENT:     Head: Normocephalic  and atraumatic.  Eyes:     General: No scleral icterus.       Right eye: No discharge.        Left eye: No discharge.     Conjunctiva/sclera: Conjunctivae normal.  Cardiovascular:     Rate and Rhythm: Normal rate and regular rhythm.  Pulmonary:     Effort: Pulmonary effort is normal. No respiratory distress.  Musculoskeletal:     Cervical back: Normal range of motion.     Comments: Left shoulder, elbow, wrist, digits- no skin wounds, sugar tong splint in place, no instability, no blocks to motion  Sens  Ax/R/M/U intact  Mot   Ax/ R/ PIN/ M/ AIN/ U intact  Fingers perfused  LLE No traumatic wounds, ecchymosis, or rash  Mild TTP hip  No knee or ankle effusion  Knee stable to varus/ valgus and anterior/posterior stress  Sens DPN, SPN, TN intact  Motor EHL, ext, flex, evers 5/5  DP 2+, PT 2+, No significant edema  Skin:    General: Skin is warm and dry.  Neurological:     Mental Status: She is alert.  Psychiatric:        Mood and Affect: Mood normal.        Behavior: Behavior normal.     Assessment/Plan: Left hip fx -- Plan cannulated hip pinning today with Dr. Eulah Pont. Please keep NPO. Left wrist fx -- Plan non-operative treatment in splint. NWB, ok to WBAT through elbow.    Freeman Caldron, PA-C Orthopedic Surgery [phone] 05/17/2023, 9:15 AM

## 2023-05-17 NOTE — Hospital Course (Addendum)
Lisa Thornton is a 68 y.o. female with a history of paroxysmal atrial fibrillation, breast cancer status post modified radical mastectomy, OSA, primary hypertension.  Patient presented secondary to fall and resultant acute left hip and wrist pain and was found to have evidence of a left hip fracture in addition to a left distal radius fracture.  Orthopedic surgery consulted for surgical management and performed a left hip pinning.

## 2023-05-17 NOTE — Assessment & Plan Note (Signed)
-   She has stage IIIa chronic kidney disease. - She will be placed on supplement coverage with NovoLog. - We will hold off metformin. - We will continue Jardiance.

## 2023-05-17 NOTE — Assessment & Plan Note (Signed)
-  We will continue Arimidex.

## 2023-05-17 NOTE — H&P (View-Only) (Signed)
Reason for Consult:Left hip fx Referring Physician: Jacquelin Hawking Time called: 0745 Time at bedside: 0858   Lisa Thornton is an 68 y.o. female.  HPI: Cardelia slipped and fell at home. She had immediate left hip pain and could not get up. She was brought to the ED where x-rays showed a left hip fx and orthopedic surgery was consulted. She lives at home alone and does not use any assistive devices to ambulate.  Past Medical History:  Diagnosis Date   Anemia 12/31/2017   Cancer (HCC)    left breast cancer   Chest pain    Diabetes mellitus without complication (HCC)    Hypertension     Past Surgical History:  Procedure Laterality Date   COLONOSCOPY WITH PROPOFOL N/A 09/27/2021   Procedure: COLONOSCOPY WITH PROPOFOL;  Surgeon: Dolores Frame, MD;  [facility];  Service: Gastroenterology;  Laterality: N/A;  945   MASTECTOMY MODIFIED RADICAL Left 12/31/2017   Procedure: LEFT MODIFIED RADICAL MASTECTOMY;  Surgeon: Franky Macho, MD;  Location: [facility];  Service: General;  Laterality: Left;   POLYPECTOMY  09/27/2021   Procedure: POLYPECTOMY;  Surgeon: Dolores Frame, MD;  [facility];  Service: Gastroenterology;;   PORTACATH PLACEMENT Right 06/27/2017   Procedure: INSERTION PORT-A-CATH;  Surgeon: Franky Macho, MD;  Location: [facility];  Service: General;  Laterality: Right;    Family History  Problem Relation Age of Onset   Breast cancer Mother    Thyroid disease Mother    Heart disease Mother    Heart attack Father    Heart attack Sister    Hypertension Sister    Heart attack Brother    Cancer Sister    Stroke Brother    Alzheimer's disease Maternal Aunt     Social History:  reports that she has never smoked. She has never used smokeless tobacco. She reports that she does not drink alcohol and does not use drugs.  Allergies: No Known Allergies  Medications: I have reviewed the patient's current medications.  Results for orders placed or  performed during the hospital encounter of 05/16/23 (from the past 48 hours)  Basic metabolic panel     Status: Abnormal   Collection Time: 05/16/23  9:38 PM  Result Value Ref Range   Sodium 136 135 - 145 mmol/L   Potassium 4.2 3.5 - 5.1 mmol/L   Chloride 100 98 - 111 mmol/L   CO2 22 22 - 32 mmol/L   Glucose, Bld 177 (H) 70 - 99 mg/dL    Comment: Glucose reference range applies only to samples taken after fasting for at least 8 hours.   BUN 23 8 - 23 mg/dL   Creatinine, Ser 4.09 (H) 0.44 - 1.00 mg/dL   Calcium 81.1 8.9 - 91.4 mg/dL   GFR, Estimated 49 (L) >60 mL/min    Comment: (NOTE) Calculated using the CKD-EPI Creatinine Equation (2021)    Anion gap 14 5 - 15    Comment: Performed at [facility], [address] [place], [place] [address]  CBC with Differential     Status: Abnormal   Collection Time: 05/16/23  9:38 PM  Result Value Ref Range   WBC 16.1 (H) 4.0 - 10.5 K/uL   RBC 4.55 3.87 - 5.11 MIL/uL   Hemoglobin 14.2 12.0 - 15.0 g/dL   HCT 62.1 30.8 - 65.7 %   MCV 88.8 80.0 - 100.0 fL   MCH 31.2 26.0 - 34.0 pg   MCHC 35.1 30.0 -  36.0 g/dL   RDW 16.1 09.6 - 04.5 %   Platelets 227 150 - 400 K/uL   nRBC 0.0 0.0 - 0.2 %   Neutrophils Relative % 81 %   Neutro Abs 12.9 (H) 1.7 - 7.7 K/uL   Lymphocytes Relative 14 %   Lymphs Abs 2.3 0.7 - 4.0 K/uL   Monocytes Relative 5 %   Monocytes Absolute 0.8 0.1 - 1.0 K/uL   Eosinophils Relative 0 %   Eosinophils Absolute 0.0 0.0 - 0.5 K/uL   Basophils Relative 0 %   Basophils Absolute 0.0 0.0 - 0.1 K/uL   Immature Granulocytes 0 %   Abs Immature Granulocytes 0.07 0.00 - 0.07 K/uL    Comment: Performed at [facility], [address], [place], [place] [address]  Surgical pcr screen     Status: Abnormal   Collection Time: 05/17/23  2:34 AM   Specimen: Nasal Mucosa; Nasal Swab  Result Value Ref Range   MRSA, PCR POSITIVE (A) NEGATIVE    Comment: RESULT CALLED TO, READ BACK BY AND VERIFIED WITH: I AKOETEY,RN@[id number]  05/17/23 MK     Staphylococcus aureus POSITIVE (A) NEGATIVE    Comment: (NOTE) The Xpert SA Assay (FDA approved for NASAL specimens in patients 53 years of age and older), is one component of a comprehensive surveillance program. It is not intended to diagnose infection nor to guide or monitor treatment. Performed at [facility], [address], [place], [place] [address]   Hemoglobin A1c     Status: Abnormal   Collection Time: 05/17/23  5:17 AM  Result Value Ref Range   Hgb A1c MFr Bld 5.9 (H) 4.8 - 5.6 %    Comment: (NOTE) Pre diabetes:          5.7%-6.4%  Diabetes:              >6.4%  Glycemic control for   <7.0% adults with diabetes    Mean Plasma Glucose 122.63 mg/dL    Comment: Performed at [facility], [address], [place], [place] [address]  Glucose, capillary     Status: Abnormal   Collection Time: 05/17/23  5:33 AM  Result Value Ref Range   Glucose-Capillary 137 (H) 70 - 99 mg/dL    Comment: Glucose reference range applies only to samples taken after fasting for at least 8 hours.  Glucose, capillary     Status: Abnormal   Collection Time: 05/17/23  5:54 AM  Result Value Ref Range   Glucose-Capillary 143 (H) 70 - 99 mg/dL    Comment: Glucose reference range applies only to samples taken after fasting for at least 8 hours.    DG Wrist Complete Left Result Date: 05/16/2023 CLINICAL DATA:  Fall onto left side EXAM: LEFT WRIST - COMPLETE 3+ VIEW COMPARISON:  None Available. FINDINGS: Possible subtle slightly impacted nondisplaced distal radius fracture. No subluxation. Positive for wrist soft tissue swelling. Mild degenerative change at the first MTP joint. IMPRESSION: Possible subtle slightly impacted nondisplaced distal radius fracture. Electronically Signed   By: Jasmine Pang M.D.   On: 05/16/2023 21:52   DG Hip Unilat With Pelvis 2-3 Views Left Result Date: 05/16/2023 CLINICAL DATA:  Larey Seat onto left side EXAM: DG HIP (WITH OR WITHOUT PELVIS) 2-3V LEFT COMPARISON:   None Available. FINDINGS: Frontal view of the pelvis as well as frontal and cross-table lateral views of the left hip are obtained. There is an impacted subcapital left femoral neck fracture, with near anatomic alignment. No  dislocation. Right hip is unremarkable. The remainder of the bony pelvis is normal. IMPRESSION: 1. Impacted subcapital left femoral neck fracture. Electronically Signed   By: Sharlet Salina M.D.   On: 05/16/2023 21:51   DG Chest Port 1 View Result Date: 05/16/2023 CLINICAL DATA:  Fall onto left side EXAM: PORTABLE CHEST 1 VIEW COMPARISON:  11/13/2022. FINDINGS: Right-sided central venous port tip at the upper SVC. Right lung grossly clear. Stable cardiomediastinal silhouette. Streaky atelectasis or scarring at the left base. Clips in the left axilla. Old right-sided rib fractures screw IMPRESSION: Streaky atelectasis or scar at left base. Electronically Signed   By: Jasmine Pang M.D.   On: 05/16/2023 21:51    Review of Systems  HENT:  Negative for ear discharge, ear pain, hearing loss and tinnitus.   Eyes:  Negative for photophobia and pain.  Respiratory:  Negative for cough and shortness of breath.   Cardiovascular:  Negative for chest pain.  Gastrointestinal:  Negative for abdominal pain, nausea and vomiting.  Genitourinary:  Negative for dysuria, flank pain, frequency and urgency.  Musculoskeletal:  Positive for arthralgias (Left hip and wrist). Negative for back pain, myalgias and neck pain.  Neurological:  Negative for dizziness and headaches.  Hematological:  Does not bruise/bleed easily.  Psychiatric/Behavioral:  The patient is not nervous/anxious.    Blood pressure 107/65, pulse 98, temperature 98.2 F (36.8 C), temperature source Oral, resp. rate 17, height 5\' 4"  (1.626 m), weight 64 kg, SpO2 96%. Physical Exam Constitutional:      General: She is not in acute distress.    Appearance: She is well-developed. She is not diaphoretic.  HENT:     Head: Normocephalic  and atraumatic.  Eyes:     General: No scleral icterus.       Right eye: No discharge.        Left eye: No discharge.     Conjunctiva/sclera: Conjunctivae normal.  Cardiovascular:     Rate and Rhythm: Normal rate and regular rhythm.  Pulmonary:     Effort: Pulmonary effort is normal. No respiratory distress.  Musculoskeletal:     Cervical back: Normal range of motion.     Comments: Left shoulder, elbow, wrist, digits- no skin wounds, sugar tong splint in place, no instability, no blocks to motion  Sens  Ax/R/M/U intact  Mot   Ax/ R/ PIN/ M/ AIN/ U intact  Fingers perfused  LLE No traumatic wounds, ecchymosis, or rash  Mild TTP hip  No knee or ankle effusion  Knee stable to varus/ valgus and anterior/posterior stress  Sens DPN, SPN, TN intact  Motor EHL, ext, flex, evers 5/5  DP 2+, PT 2+, No significant edema  Skin:    General: Skin is warm and dry.  Neurological:     Mental Status: She is alert.  Psychiatric:        Mood and Affect: Mood normal.        Behavior: Behavior normal.     Assessment/Plan: Left hip fx -- Plan cannulated hip pinning today with Dr. Eulah Pont. Please keep NPO. Left wrist fx -- Plan non-operative treatment in splint. NWB, ok to WBAT through elbow.    Freeman Caldron, PA-C Orthopedic Surgery [phone] 05/17/2023, 9:15 AM

## 2023-05-17 NOTE — Op Note (Signed)
05/16/2023 - 05/17/2023  3:13 PM  PATIENT:  Lisa Thornton    PRE-OPERATIVE DIAGNOSIS:  left hip fracture  POST-OPERATIVE DIAGNOSIS:  Same  PROCEDURE:  LEFT HIP PINNING  SURGEON:  Sheral Apley, MD  ASSISTANT: Levester Fresh, PA-C, he was present and scrubbed throughout the case, critical for completion in a timely fashion, and for retraction, instrumentation, and closure.   ANESTHESIA:   General  PREOPERATIVE INDICATIONS:  Lisa Thornton is a  68 y.o. female who fell and was found to have a diagnosis of left hip fracture who elected for surgical management.    The risks benefits and alternatives were discussed with the patient preoperatively including but not limited to the risks of infection, bleeding, nerve injury, cardiopulmonary complications, blood clots, malunion, nonunion, avascular necrosis, the need for revision surgery, the potential for conversion to hemiarthroplasty, among others, and the patient was willing to proceed.  OPERATIVE IMPLANTS: 6.5 mm cannulated screws x3  OPERATIVE FINDINGS: appropirate fem neck fracture for the below treatment.   OPERATIVE PROCEDURE: The patient was brought to the operating room and placed in supine position. IV antibiotics were given. General anesthesia administered. Foley was also given. The patient was placed on the fracture table. The operative extremity was positioned, without any significant reduction maneuver and was prepped and draped in usual sterile fashion.  Time out was performed.  Small incisions were made distal to the greater trochanter, and 3 guidewires were introduced Into an inverted triangle configuration. The lengths were measured. The reduction was slightly valgus, and near-anatomic. I opened the cortex with a cannulated drill, and then placed the screws into position. Satisfactory fixation was achieved. I sequentially tightened the screws by hand.  I performed a live fluoroscopic exam and no screw penetrance was noted. All  threads crossed the fracture site.   The wounds were irrigated copiously, and repaired with Vicryl with Steri-Strips and sterile gauze. There no complications and the patient tolerated the procedure well.  The patient will be weightbearing as tolerated, VTE prophylaxis will be: chemical and mobilization

## 2023-05-17 NOTE — Assessment & Plan Note (Signed)
-   She finished a course of Bactrim DS for recent UTI. - Will repeat urinalysis.

## 2023-05-17 NOTE — Transfer of Care (Signed)
Immediate Anesthesia Transfer of Care Note  Patient: Lisa Thornton  Procedure(s) Performed: LEFT HIP PINNING (Left)  Patient Location: PACU  Anesthesia Type:General  Level of Consciousness: drowsy  Airway & Oxygen Therapy: Patient Spontanous Breathing  Post-op Assessment: Report given to RN and Post -op Vital signs reviewed and stable  Post vital signs: Reviewed and stable  Last Vitals:  Vitals Value Taken Time  BP    Temp    Pulse 88 05/17/23 1534  Resp 11 05/17/23 1534  SpO2 96 % 05/17/23 1534  Vitals shown include unfiled device data.  Last Pain:  Vitals:   05/17/23 1243  TempSrc:   PainSc: 0-No pain      Patients Stated Pain Goal: 0 (05/17/23 1045)  Complications: No notable events documented.

## 2023-05-17 NOTE — Assessment & Plan Note (Addendum)
-   This is secondary to mechanical fall. - The patient was admitted to a telemetry bed. - Pain management will be provided. - Orthopedic consult will be obtained. - Dr. Eulah Pont was notified and is aware about the patient. - The patient has a history of diabetes mellitus but is not on insulin.  She has no history of CVA, coronary artery disease, CHF or renal failure with a creatinine more than 2.  She is considered at average risk for her age for perioperative cardiovascular risk events per the revised cardiac risk index. - Her Eliquis was not taken for 2 days. - She has no current pulmonary issues. - She has been kept n.p.o. after midnight.

## 2023-05-17 NOTE — Assessment & Plan Note (Signed)
-   We will continue antihypertensive therapy.  That includes Toprol-XL that should provide perioperative cardiovascular risk reduction.

## 2023-05-17 NOTE — Anesthesia Preprocedure Evaluation (Addendum)
Anesthesia Evaluation    Reviewed: Allergy & Precautions, Patient's Chart, lab work & pertinent test results  Airway Mallampati: II  TM Distance: >3 FB Neck ROM: Full    Dental  (+) Missing, Dental Advisory Given, Poor Dentition   Pulmonary sleep apnea    Pulmonary exam normal breath sounds clear to auscultation       Cardiovascular hypertension, Pt. on medications Normal cardiovascular exam+ dysrhythmias Atrial Fibrillation  Rhythm:Regular Rate:Normal   IMPRESSIONS     1. Limited Echo to evaluate LVEF.   2. Left ventricular ejection fraction, by estimation, is 45%. The left  ventricle has mildly decreased function. The left ventricle has no  regional wall motion abnormalities. Left ventricular diastolic function  could not be evaluated.   3. Right ventricular systolic function is normal. The right ventricular  size is normal. Tricuspid regurgitation signal is inadequate for assessing  PA pressure.   4. The inferior vena cava is normal in size with greater than 50%  respiratory variability, suggesting right atrial pressure of 3 mmHg.   Comparison(s): No significant change from prior study.     Neuro/Psych    GI/Hepatic   Endo/Other  diabetes, Type 2    Renal/GU Renal diseaseLab Results      Component                Value               Date                             K                        4.2                 05/16/2023                BUN                      23                  05/16/2023                CREATININE               1.19 (H)            05/16/2023                GFRNONAA                 49 (L)              05/16/2023                      GLUCOSE                  177 (H)             05/16/2023                Musculoskeletal   Abdominal   Peds  Hematology  (+) Blood dyscrasia, anemia Lab Results      Component                Value               Date  WBC                       16.1 (H)            05/16/2023                HGB                      14.2                05/16/2023                HCT                      40.4                05/16/2023                MCV                      88.8                05/16/2023                PLT                      227                 05/16/2023              Anesthesia Other Findings Breast CA  Reproductive/Obstetrics                              Anesthesia Physical Anesthesia Plan  ASA: 3  Anesthesia Plan: General   Post-op Pain Management:    Induction: Intravenous and Rapid sequence  PONV Risk Score and Plan:   Airway Management Planned: Oral ETT and Video Laryngoscope Planned  Additional Equipment:   Intra-op Plan:   Post-operative Plan: Extubation in OR  Informed Consent: I have reviewed the patients History and Physical, chart, labs and discussed the procedure including the risks, benefits and alternatives for the proposed anesthesia with the patient or authorized representative who has indicated his/her understanding and acceptance.     Dental advisory given  Plan Discussed with: CRNA  Anesthesia Plan Comments:          Anesthesia Quick Evaluation

## 2023-05-18 DIAGNOSIS — I480 Paroxysmal atrial fibrillation: Secondary | ICD-10-CM | POA: Diagnosis not present

## 2023-05-18 DIAGNOSIS — S62102A Fracture of unspecified carpal bone, left wrist, initial encounter for closed fracture: Secondary | ICD-10-CM | POA: Diagnosis not present

## 2023-05-18 DIAGNOSIS — E440 Moderate protein-calorie malnutrition: Secondary | ICD-10-CM | POA: Insufficient documentation

## 2023-05-18 DIAGNOSIS — D72829 Elevated white blood cell count, unspecified: Secondary | ICD-10-CM | POA: Diagnosis not present

## 2023-05-18 DIAGNOSIS — S72002A Fracture of unspecified part of neck of left femur, initial encounter for closed fracture: Secondary | ICD-10-CM | POA: Diagnosis not present

## 2023-05-18 LAB — BASIC METABOLIC PANEL
Anion gap: 5 (ref 5–15)
BUN: 23 mg/dL (ref 8–23)
CO2: 21 mmol/L — ABNORMAL LOW (ref 22–32)
Calcium: 7.2 mg/dL — ABNORMAL LOW (ref 8.9–10.3)
Chloride: 111 mmol/L (ref 98–111)
Creatinine, Ser: 0.92 mg/dL (ref 0.44–1.00)
GFR, Estimated: >60 mL/min (ref >60)
Glucose, Bld: 117 mg/dL — ABNORMAL HIGH (ref 70–99)
Potassium: 3.7 mmol/L (ref 3.5–5.1)
Sodium: 137 mmol/L (ref 135–145)

## 2023-05-18 LAB — CBC
HCT: 30.2 % — ABNORMAL LOW (ref 36.0–46.0)
Hemoglobin: 10.2 g/dL — ABNORMAL LOW (ref 12.0–15.0)
MCH: 31 pg (ref 26.0–34.0)
MCHC: 33.8 g/dL (ref 30.0–36.0)
MCV: 91.8 fL (ref 80.0–100.0)
Platelets: 146 10*3/uL — ABNORMAL LOW (ref 150–400)
RBC: 3.29 MIL/uL — ABNORMAL LOW (ref 3.87–5.11)
RDW: 13.2 % (ref 11.5–15.5)
WBC: 9.4 10*3/uL (ref 4.0–10.5)
nRBC: 0 % (ref 0.0–0.2)

## 2023-05-18 LAB — ALBUMIN: Albumin: 2.8 g/dL — ABNORMAL LOW (ref 3.5–5.0)

## 2023-05-18 LAB — GLUCOSE, CAPILLARY
Glucose-Capillary: 155 mg/dL — ABNORMAL HIGH (ref 70–99)
Glucose-Capillary: 150 mg/dL — ABNORMAL HIGH (ref 70–99)
Glucose-Capillary: 118 mg/dL — ABNORMAL HIGH (ref 70–99)
Glucose-Capillary: 204 mg/dL — ABNORMAL HIGH (ref 70–99)
Glucose-Capillary: 161 mg/dL — ABNORMAL HIGH (ref 70–99)
Glucose-Capillary: 174 mg/dL — ABNORMAL HIGH (ref 70–99)

## 2023-05-18 MED ORDER — APIXABAN 5 MG PO TABS
5.0000 mg | ORAL_TABLET | Freq: Two times a day (BID) | ORAL | Status: DC
  Administered 2023-05-18 – 2023-05-21 (×7): 5 mg via ORAL
  Filled 2023-05-18 (×7): qty 1

## 2023-05-18 NOTE — TOC Initial Note (Signed)
Transition of Care [facility]) - Initial/Assessment Note    Patient Details  Name: Lisa Thornton MRN: [id number] Date of Birth: 03-Nov-1955  Transition of [facility]) CM/SW Contact:    Kermit Balo, RN Phone Number: 05/18/2023, 1:24 PM  Clinical Narrative:                  Pt is from home alone. She is going to move into her nieces home after d/c. She will have support at that time.  She states her niece can provide needed transportation. Currently the recommendations are for SNF. LCSW consulted.  TOC following.  Expected Discharge Plan: Skilled Nursing Facility Barriers to Discharge: Continued Medical Work up   Patient Goals and CMS Choice            Expected Discharge Plan and Services   Discharge Planning Services: CM Consult   Living arrangements for the past 2 months: Single Family Home                                      Prior Living Arrangements/Services Living arrangements for the past 2 months: Single Family Home Lives with:: Self Patient language and need for interpreter reviewed:: Yes          Care giver support system in place?: Yes (comment) Current home services: DME (walker/ BSC/ shower seat) Criminal Activity/Legal Involvement Pertinent to Current Situation/Hospitalization: No - Comment as needed  Activities of Daily Living   ADL Screening (condition at time of admission) Independently performs ADLs?: Yes (appropriate for developmental age) Is the patient deaf or have difficulty hearing?: No Does the patient have difficulty seeing, even when wearing glasses/contacts?: No Does the patient have difficulty concentrating, remembering, or making decisions?: No  Permission Sought/Granted                  Emotional Assessment Appearance:: Appears stated age Attitude/Demeanor/Rapport: Engaged Affect (typically observed): Accepting Orientation: : Oriented to Self, Oriented to Place, Oriented to  Time, Oriented to Situation   Psych  Involvement: No (comment)  Admission diagnosis:  Closed left hip fracture (HCC) [S72.002A] Fall, initial encounter [W19.XXXA] Closed fracture of neck of left femur, initial encounter (HCC) [S72.002A] Closed fracture of distal end of left radius, unspecified fracture morphology, initial encounter [S52.502A] Patient Active Problem List   Diagnosis Date Noted   Type 2 diabetes mellitus with chronic kidney disease, with long-term current use of insulin (HCC) 05/17/2023   Dysuria 05/17/2023   Dyslipidemia 05/17/2023   Closed left hip fracture (HCC) 05/16/2023   Nausea 11/29/2022   Generalized weakness 11/15/2022   OSA (obstructive sleep apnea) 09/06/2022   Iron deficiency anemia 08/07/2022   Hospital discharge follow-up 08/07/2022   HLD (hyperlipidemia) 08/01/2022   Cardiomyopathy (HCC) 08/01/2022   Chronic diastolic heart failure (HCC) 08/01/2022   Atrial fibrillation with rapid ventricular response (HCC) 07/03/2022   Hypokalemia 03/17/2021   Onychomycosis 03/17/2021   Encounter for general adult medical examination with abnormal findings 11/11/2020   Fatigue 11/11/2020   Type 2 diabetes mellitus with diabetic neuropathy, unspecified (HCC) 10/29/2019   S/P mastectomy, left 12/31/2017   Atrial fibrillation (HCC) 12/31/2017   Malignant neoplasm of central portion of left female breast [facility])    Essential hypertension 06/22/2017   Breast cancer of upper-outer quadrant of left female breast (HCC) 06/22/2017   PCP:  Anabel Halon, MD Pharmacy:   [id number] - , [place] -  [address] [place] [address] Phone: [phone] Fax: [phone]  [facility] - [place], [place] - [address] [place] [address] Phone: [phone] Fax: [phone]     Social Drivers of Health (SDOH) Social History: SDOH Screenings   Food Insecurity: No Food Insecurity (05/17/2023)  Housing: Low Risk  (05/17/2023)   Transportation Needs: No Transportation Needs (05/17/2023)  Utilities: Not At Risk (05/17/2023)  Alcohol Screen: Low Risk  (03/25/2020)  Depression (PHQ2-9): Low Risk  (01/22/2023)  Financial Resource Strain: Low Risk  (03/29/2021)  Physical Activity: Insufficiently Active (03/29/2021)  Social Connections: Socially Isolated (05/17/2023)  Stress: No Stress Concern Present (03/29/2021)  Tobacco Use: Low Risk  (04/28/2023)   SDOH Interventions:     Readmission Risk Interventions     No data to display

## 2023-05-18 NOTE — Progress Notes (Signed)
PROGRESS NOTE    Lisa Thornton  JXB:[id number] DOB: 1956-03-09 DOA: 05/16/2023 PCP: Anabel Halon, MD   Brief Narrative: Lisa Thornton is a 68 y.o. female with a history of paroxysmal atrial fibrillation, breast cancer status post modified radical mastectomy, OSA, primary hypertension.  Patient presented secondary to fall and resultant acute left hip and wrist pain and was found to have evidence of a left hip fracture in addition to a left distal radius fracture.  Orthopedic surgery consulted for surgical management.   Assessment and Plan:  Left hip fracture Secondary to fall.  Unilateral hip and pelvis x-ray on admission significant for an impacted subcapital left femoral neck fracture.  Orthopedic surgery consulted with recommendations for operative management.  Left wrist fracture Secondary to fall.  Wrist x-ray on admission significant for a possible subtle slightly impacted nondisplaced distal radius fracture.  Orthopedic surgery recommendations for nonoperative management with splint.  Recommendation for nonweightbearing, however okay to weight-bear as tolerated through elbow.  Paroxysmal atrial fibrillation Patient is in atrial fibrillation. Patient is managed on Eliquis and Toprol-XL as an outpatient.  Eliquis held on admission and restarted post-op. -Continue Toprol-XL and Eliquis  Leukocytosis Likely reactive secondary to acute fracture.  Dysuria Patient recently treated with Bactrim DS.  Urinalysis ordered and is still pending.  Primary hypertension Patient is on losartan and metoprolol succinate as an outpatient. -Continue metoprolol succinate and losartan  Diabetes mellitus type 2 Well-controlled with hemoglobin A1c of 5.9%. Patient is on Jardiance, metformin, semaglutide as an outpatient.  Hold outpatient regimen. -SSI  Postoperative anemia Perioperative blood loss Hemoglobin of 14.2 g/dL on admission, down to 13.0 g/dL post operatively. -Trend  CBC  Hyperlipidemia -Continue Lipitor  Moderate malnutrition -Dietitian recommendations (2/13): Ensure Enlive po BID, each supplement provides 350 kcal and 20 grams of protein. Provided brief education on importance of protein intake and adequate calorie intake post-op Recommend room service with assist  Documented food preferences  Thrombocytopenia Mild. Likely secondary to blood loss. -CBC in AM   DVT prophylaxis: Eliquis Code Status:   Code Status: Full Code Family Communication: None at bedside Disposition Plan: Discharge to SNF pending stable hemoglobin, ongoing specialist recommendations   Consultants:  Orthopedic surgery  Procedures:  None  Antimicrobials: None   Subjective: No issues this morning. Pain is improved from yesterday.  Objective: BP 119/64 (BP Location: Right Arm)   Pulse 87   Temp 98.9 F (37.2 C) (Oral)   Resp 18   Ht 5\' 4"  (1.626 m)   Wt 64 kg   SpO2 99%   BMI 24.20 kg/m   Examination:  General exam: Appears calm and comfortable Respiratory system: Clear to auscultation. Respiratory effort normal. Cardiovascular system: S1 & S2 heard, irregular rhythm, normal rate. No murmurs. Gastrointestinal system: Abdomen is nondistended, soft and nontender. Normal bowel sounds heard. Central nervous system: Alert and oriented. Psychiatry: Judgement and insight appear normal. Mood & affect appropriate.    Data Reviewed: I have personally reviewed following labs and imaging studies  CBC Lab Results  Component Value Date   WBC 9.4 05/18/2023   RBC 3.29 (L) 05/18/2023   HGB 10.2 (L) 05/18/2023   HCT 30.2 (L) 05/18/2023   MCV 91.8 05/18/2023   MCH 31.0 05/18/2023   PLT 146 (L) 05/18/2023   MCHC 33.8 05/18/2023   RDW 13.2 05/18/2023   LYMPHSABS 2.3 05/16/2023   MONOABS 0.8 05/16/2023   EOSABS 0.0 05/16/2023   BASOSABS 0.0 05/16/2023     Last metabolic  panel Lab Results  Component Value Date   NA 137 05/18/2023   K 3.7 05/18/2023    CL 111 05/18/2023   CO2 21 (L) 05/18/2023   BUN 23 05/18/2023   CREATININE 0.92 05/18/2023   GLUCOSE 117 (H) 05/18/2023   GFRNONAA >60 05/18/2023   GFRAA >60 10/20/2019   CALCIUM 7.2 (L) 05/18/2023   PHOS 3.4 07/06/2022   PROT 7.6 01/23/2023   ALBUMIN 3.5 01/23/2023   LABGLOB 3.8 12/12/2021   AGRATIO 1.1 (L) 12/12/2021   BILITOT 0.8 01/23/2023   ALKPHOS 65 01/23/2023   AST 36 01/23/2023   ALT 24 01/23/2023   ANIONGAP 5 05/18/2023    GFR: Estimated Creatinine Clearance: 49.8 mL/min (by C-G formula based on SCr of 0.92 mg/dL).  Recent Results (from the past 240 hours)  Surgical pcr screen     Status: Abnormal   Collection Time: 05/17/23  2:34 AM   Specimen: Nasal Mucosa; Nasal Swab  Result Value Ref Range Status   MRSA, PCR POSITIVE (A) NEGATIVE Final    Comment: RESULT CALLED TO, READ BACK BY AND VERIFIED WITH: I AKOETEY,RN@[id number]  05/17/23 MK    Staphylococcus aureus POSITIVE (A) NEGATIVE Final    Comment: (NOTE) The Xpert SA Assay (FDA approved for NASAL specimens in patients 44 years of age and older), is one component of a comprehensive surveillance program. It is not intended to diagnose infection nor to guide or monitor treatment. Performed at [facility], [address] [place], [place] [address]       Radiology Studies: DG HIP UNILAT WITH PELVIS 2-3 VIEWS LEFT Result Date: 05/17/2023 CLINICAL DATA:  Elective surgery, pinning of hip fracture. EXAM: DG HIP (WITH OR WITHOUT PELVIS) 2-3V LEFT COMPARISON:  Preoperative imaging. FINDINGS: Two fluoroscopic spot views of the left hip submitted from the operating room. Three screws traverse left femoral neck fracture. Fluoroscopy time 51.9 seconds. Dose 8.1 mGy. IMPRESSION: Intraoperative fluoroscopy during left femoral neck fracture fixation. Electronically Signed   By: Narda Rutherford M.D.   On: 05/17/2023 15:36   DG C-Arm 1-60 Min-No Report Result Date: 05/17/2023 Fluoroscopy was utilized by the requesting  physician.  No radiographic interpretation.   DG Wrist Complete Left Result Date: 05/16/2023 CLINICAL DATA:  Fall onto left side EXAM: LEFT WRIST - COMPLETE 3+ VIEW COMPARISON:  None Available. FINDINGS: Possible subtle slightly impacted nondisplaced distal radius fracture. No subluxation. Positive for wrist soft tissue swelling. Mild degenerative change at the first MTP joint. IMPRESSION: Possible subtle slightly impacted nondisplaced distal radius fracture. Electronically Signed   By: Jasmine Pang M.D.   On: 05/16/2023 21:52   DG Hip Unilat With Pelvis 2-3 Views Left Result Date: 05/16/2023 CLINICAL DATA:  Larey Seat onto left side EXAM: DG HIP (WITH OR WITHOUT PELVIS) 2-3V LEFT COMPARISON:  None Available. FINDINGS: Frontal view of the pelvis as well as frontal and cross-table lateral views of the left hip are obtained. There is an impacted subcapital left femoral neck fracture, with near anatomic alignment. No dislocation. Right hip is unremarkable. The remainder of the bony pelvis is normal. IMPRESSION: 1. Impacted subcapital left femoral neck fracture. Electronically Signed   By: Sharlet Salina M.D.   On: 05/16/2023 21:51   DG Chest Port 1 View Result Date: 05/16/2023 CLINICAL DATA:  Fall onto left side EXAM: PORTABLE CHEST 1 VIEW COMPARISON:  11/13/2022. FINDINGS: Right-sided central venous port tip at the upper SVC. Right lung grossly clear. Stable cardiomediastinal silhouette. Streaky atelectasis or scarring at the left base.  Clips in the left axilla. Old right-sided rib fractures screw IMPRESSION: Streaky atelectasis or scar at left base. Electronically Signed   By: Jasmine Pang M.D.   On: 05/16/2023 21:51      LOS: 2 days    Jacquelin Hawking, MD [facility] 05/18/2023, 1:54 PM   If 7PM-7AM, please contact night-coverage [email or web address]

## 2023-05-18 NOTE — Progress Notes (Signed)
Subjective: Patient reports pain as moderate to severe in both hip and wrist.  Tolerating diet.  Urinating.   No CP, SOB.  Has mobilized some OOB with PT but was limited in what she could do because of nausea and urinary incontinence.  Objective:   VITALS:   Vitals:   05/17/23 2004 05/17/23 2146 05/18/23 0439 05/18/23 0919  BP: 111/66 105/63 113/61 119/64  Pulse: 95 98 85 87  Resp: 18  18 18   Temp: 97.6 F (36.4 C)  98 F (36.7 C) 98.9 F (37.2 C)  TempSrc: Oral  Oral Oral  SpO2: 98%  97% 99%  Weight:      Height:          Latest Ref Rng & Units 05/18/2023    6:20 AM 05/16/2023    9:38 PM 01/23/2023    3:21 PM  CBC  WBC 4.0 - 10.5 K/uL 9.4  16.1  10.6   Hemoglobin 12.0 - 15.0 g/dL 01.0  27.2  53.6   Hematocrit 36.0 - 46.0 % 30.2  40.4  36.2   Platelets 150 - 400 K/uL 146  227  211       Latest Ref Rng & Units 05/18/2023    6:20 AM 05/16/2023    9:38 PM 01/23/2023    3:21 PM  BMP  Glucose 70 - 99 mg/dL 644  034  742   BUN 8 - 23 mg/dL 23  23  16    Creatinine 0.44 - 1.00 mg/dL 5.95  6.38  7.56   Sodium 135 - 145 mmol/L 137  136  138   Potassium 3.5 - 5.1 mmol/L 3.7  4.2  3.6   Chloride 98 - 111 mmol/L 111  100  102   CO2 22 - 32 mmol/L 21  22  24    Calcium 8.9 - 10.3 mg/dL 7.2  43.3  9.9    9.6    Intake/Output      02/13 0701 02/14 0700 02/14 0701 02/15 0700   P.O. 60    I.V. (mL/kg) 1384.2 (21.6)    IV Piggyback 100    Total Intake(mL/kg) 1544.2 (24.1)    Urine (mL/kg/hr) 500 (0.3)    Blood 30    Total Output 530    Net +1014.2            Physical Exam: General: NAD.  Laying in bed, calm, comfortable Resp: No increased wob Cardio: regular rate and rhythm ABD soft Neurologically intact MSK Neurovascularly intact Sensation intact distally Intact pulses distally Dorsiflexion/Plantar flexion intact Incision: dressing C/D/I  L wrist TTP  Edema present  ROM to fingers intact Brace in place   Assessment: 1 Day Post-Op  S/P Procedure(s)  (LRB): LEFT HIP PINNING (Left) by Dr. Jewel Baize. Eulah Pont on 05/17/23  Principal Problem:   Closed left hip fracture [facility]) Active Problems:   Essential hypertension   Breast cancer of upper-outer quadrant of left female breast (HCC)   Type 2 diabetes mellitus with chronic kidney disease, with long-term current use of insulin (HCC)   Dysuria   Dyslipidemia   Malnutrition of moderate degree   Plan: Treating L wrist as presumptive fracture although xrays were not completely conclusive. For now will non-op it and stay in a wrist brace until office f/u in 2 weeks   Advance diet Up with therapy Incentive Spirometry Elevate and Apply ice  Weightbearing: WBAT LLE, NWB LUE Insicional and dressing care: Dressings left intact until follow-up and Reinforce dressings as needed  Orthopedic device(s):  left wrist brace Showering: Keep dressing dry VTE prophylaxis:  Eliquis 5mg  bid at baseline restarted today  , SCDs, ambulation Pain control: PRN Follow - up plan: 2 weeks after surgery Contact information:  Margarita Rana MD, Levester Fresh PA-C  Dispo:  PT recommending SNF. Patient wants to d/c to her niece's home and feels it would be safe.      Jenne Pane, PA-C Office [phone] 05/18/2023, 1:59 PM

## 2023-05-18 NOTE — Progress Notes (Signed)
Transition of Care [facility]) - CAGE-AID Screening   Patient Details  Name: Lisa Thornton MRN: [id number] Date of Birth: 04/03/1956   Hewitt Shorts, RN Trauma Response Nurse Phone Number: [phone] 05/18/2023, 1:42 PM    CAGE-AID Screening:    Have You Ever Felt You Ought to Cut Down on Your Drinking or Drug Use?: No Have People Annoyed You By Critizing Your Drinking Or Drug Use?: No Have You Felt Bad Or Guilty About Your Drinking Or Drug Use?: No Have You Ever Had a Drink or Used Drugs First Thing In The Morning to Steady Your Nerves or to Get Rid of a Hangover?: No CAGE-AID Score: 0  Substance Abuse Education Offered: (S) No (Denies alcohol/drug use, no services needed)

## 2023-05-18 NOTE — Care Management Important Message (Signed)
Important Message  Patient Details  Name: Lisa Thornton MRN: [id number] Date of Birth: 1955-08-28   Important Message Given:  Yes - Medicare IM     Dorena Bodo 05/18/2023, 3:39 PM

## 2023-05-18 NOTE — Evaluation (Signed)
Physical Therapy Evaluation Patient Details Name: Lisa Thornton MRN: [id number] DOB: [birth year] Today's Date: 05/18/2023  History of Present Illness  68 y/o female presents to [facility] on 2/13 after she slipped and fell at home. Xrays showed a left hip fx s/p left hip pinning and also has a left wrist fx (plan non-operative treatment). PMH includes anemia, hx of L breast cancer, chest pain, DM w/o complication, and HTN.  Clinical Impression  PTA, pt lives alone and is independent. Pt presents with decreased functional mobility secondary to pain, LLE weakness, decreased ROM, impaired sitting/standing balance, and cognitive impairment. Pt requiring two person moderate assist for transfers and ambulating a few feet with L PFRW. Further distance limited due to pt nausea and urinary incontinence. Patient will benefit from continued inpatient follow up therapy, <3 hours/day in order to address deficits and maximize functional mobility.         If plan is discharge home, recommend the following: A lot of help with walking and/or transfers;A lot of help with bathing/dressing/bathroom   Can travel by private vehicle   No    Equipment Recommendations BSC/3in1;Wheelchair (measurements PT);Wheelchair cushion (measurements PT);Other (comment) (L PFRW)  Recommendations for Other Services       Functional Status Assessment Patient has had a recent decline in their functional status and demonstrates the ability to make significant improvements in function in a reasonable and predictable amount of time.     Precautions / Restrictions Precautions Precautions: Fall Required Braces or Orthoses: Other Brace Other Brace: L wrist brace Restrictions Weight Bearing Restrictions Per Provider Order: Yes LUE Weight Bearing Per Provider Order: Weight bear through elbow only LLE Weight Bearing Per Provider Order: Weight bearing as tolerated      Mobility  Bed Mobility Overal bed mobility: Needs Assistance Bed  Mobility: Supine to Sit     Supine to sit: Mod assist     General bed mobility comments: Assist for LLE negotiation out of bed and use of bed pad to scoot hips out to edge    Transfers Overall transfer level: Needs assistance Equipment used: Left platform walker Transfers: Sit to/from Stand Sit to Stand: Mod assist, +2 physical assistance           General transfer comment: ModA + 2 to power up to standing position. Verbal cues for R hand placement    Ambulation/Gait Ambulation/Gait assistance: Min assist, +2 safety/equipment Gait Distance (Feet): 3 Feet Assistive device: Right platform walker Gait Pattern/deviations: Step-to pattern, Decreased stance time - left, Decreased weight shift to left, Trunk flexed Gait velocity: decreased Gait velocity interpretation: <1.31 ft/sec, indicative of household ambulator   General Gait Details: Verbal cues for sequencing/technique, walker use.  Stairs            Wheelchair Mobility     Tilt Bed    Modified Rankin (Stroke Patients Only)       Balance Overall balance assessment: Needs assistance Sitting-balance support: Feet supported Sitting balance-Leahy Scale: Poor Sitting balance - Comments: Posterior LOB with challenge   Standing balance support: Bilateral upper extremity supported Standing balance-Leahy Scale: Poor Standing balance comment: reliant on R PFRW                             Pertinent Vitals/Pain Pain Assessment Pain Descriptors / Indicators: Discomfort, Operative site guarding, Grimacing Pain Intervention(s): Limited activity within patient's tolerance, Monitored during session, Premedicated before session    Home Living Family/patient expects to  be discharged to:: Private residence Living Arrangements: Other relatives Available Help at Discharge: Family;Available 24 hours/day Type of Home: Mobile home Home Access: Stairs to enter       Home Layout: One level Home Equipment:  Agricultural consultant (2 wheels);Shower seat;BSC/3in1      Prior Function Prior Level of Function : Independent/Modified Independent               ADLs Comments: Independent     Extremity/Trunk Assessment   Upper Extremity Assessment Upper Extremity Assessment: Defer to OT evaluation    Lower Extremity Assessment Lower Extremity Assessment: LLE deficits/detail LLE Deficits / Details: Grossly weak post op, able to perform quad set. ankle dorsiflexion WFL    Cervical / Trunk Assessment Cervical / Trunk Assessment: Normal  Communication   Communication Communication: No apparent difficulties    Cognition Arousal: Alert Behavior During Therapy: WFL for tasks assessed/performed   PT - Cognitive impairments: No family/caregiver present to determine baseline                       PT - Cognition Comments: A&Ox4, however, demonstrates some decreased memory recall, difficulties with problem solving Following commands: Intact       Cueing       General Comments      Exercises     Assessment/Plan    PT Assessment Patient needs continued PT services  PT Problem List Decreased strength;Decreased activity tolerance;Decreased balance;Decreased mobility;Decreased cognition;Decreased safety awareness;Pain       PT Treatment Interventions DME instruction;Gait training;Functional mobility training;Therapeutic activities;Therapeutic exercise;Balance training;Stair training;Patient/family education    PT Goals (Current goals can be found in the Care Plan section)  Acute Rehab PT Goals Patient Stated Goal: return to baseline PT Goal Formulation: With patient Time For Goal Achievement: 06/01/23 Potential to Achieve Goals: Good    Frequency Min 1X/week     Co-evaluation               AM-PAC PT "6 Clicks" Mobility  Outcome Measure Help needed turning from your back to your side while in a flat bed without using bedrails?: A Little Help needed moving from  lying on your back to sitting on the side of a flat bed without using bedrails?: A Lot Help needed moving to and from a bed to a chair (including a wheelchair)?: A Little Help needed standing up from a chair using your arms (e.g., wheelchair or bedside chair)?: A Lot Help needed to walk in hospital room?: A Little Help needed climbing 3-5 steps with a railing? : Total 6 Click Score: 14    End of Session Equipment Utilized During Treatment: Gait belt Activity Tolerance: Patient tolerated treatment well Patient left: in chair;with call bell/phone within reach;with chair alarm set Nurse Communication: Mobility status PT Visit Diagnosis: Unsteadiness on feet (R26.81);History of falling (Z91.81);Difficulty in walking, not elsewhere classified (R26.2);Pain Pain - Right/Left: Left Pain - part of body: Hip    Time: 5784-6962 PT Time Calculation (min) (ACUTE ONLY): 36 min   Charges:   PT Evaluation $PT Eval Low Complexity: 1 Low PT Treatments $Therapeutic Activity: 8-22 mins PT General Charges $$ ACUTE PT VISIT: 1 Visit         Lillia Pauls, PT, DPT Acute Rehabilitation Services Office [phone]   Norval Morton 05/18/2023, 11:27 AM

## 2023-05-18 NOTE — NC FL2 (Signed)
[place] MEDICAID FL2 LEVEL OF CARE FORM     IDENTIFICATION  Patient Name: Lisa Thornton Birthdate: [birth year] Sex: female Admission Date (Current Location): 05/16/2023  [place] and [place] Number:  Haynes Bast [id number] K Facility and Address:  [facility], [address] [place], [place] [address]      Provider Number: [id number]  Attending Physician Name and Address:  Narda Bonds, MD  Relative Name and Phone Number:  Theresa Duty Niece   [phone]    Current Level of Care: Hospital Recommended Level of Care: Skilled Nursing Facility Prior Approval Number:    Date Approved/Denied:   PASRR Number: [id number]  Discharge Plan: SNF    Current Diagnoses: Patient Active Problem List   Diagnosis Date Noted   Malnutrition of moderate degree 05/18/2023   Type 2 diabetes mellitus with chronic kidney disease, with long-term current use of insulin (HCC) 05/17/2023   Dysuria 05/17/2023   Dyslipidemia 05/17/2023   Closed left hip fracture (HCC) 05/16/2023   Nausea 11/29/2022   Generalized weakness 11/15/2022   OSA (obstructive sleep apnea) 09/06/2022   Iron deficiency anemia 08/07/2022   Hospital discharge follow-up 08/07/2022   HLD (hyperlipidemia) 08/01/2022   Cardiomyopathy (HCC) 08/01/2022   Chronic diastolic heart failure (HCC) 08/01/2022   Atrial fibrillation with rapid ventricular response (HCC) 07/03/2022   Hypokalemia 03/17/2021   Onychomycosis 03/17/2021   Encounter for general adult medical examination with abnormal findings 11/11/2020   Fatigue 11/11/2020   Type 2 diabetes mellitus with diabetic neuropathy, unspecified (HCC) 10/29/2019   S/P mastectomy, left 12/31/2017   Atrial fibrillation (HCC) 12/31/2017   Malignant neoplasm of central portion of left female breast (HCC)    Essential hypertension 06/22/2017   Breast cancer of upper-outer quadrant of left female breast (HCC) 06/22/2017    Orientation RESPIRATION BLADDER  Height & Weight     Self, Time, Situation, Place  Normal Continent, External catheter Weight: 141 lb (64 kg) Height:  5\' 4"  (162.6 cm)  BEHAVIORAL SYMPTOMS/MOOD NEUROLOGICAL BOWEL NUTRITION STATUS      Continent Diet (see discharge summary)  AMBULATORY STATUS COMMUNICATION OF NEEDS Skin   Limited Assist Verbally Surgical wounds                       Personal Care Assistance Level of Assistance  Bathing, Feeding, Dressing Bathing Assistance: Limited assistance Feeding assistance: Limited assistance Dressing Assistance: Limited assistance     Functional Limitations Info  Sight, Hearing, Speech Sight Info: Adequate Hearing Info: Adequate Speech Info: Adequate    SPECIAL CARE FACTORS FREQUENCY  PT (By licensed PT), OT (By licensed OT)     PT Frequency: 5x week OT Frequency: 5x week            Contractures Contractures Info: Not present    Additional Factors Info  Code Status, Allergies, Insulin Sliding Scale Code Status Info: full Allergies Info: NKA   Insulin Sliding Scale Info: Novolog: see discharge summary       Current Medications (05/18/2023):  This is the current hospital active medication list Current Facility-Administered Medications  Medication Dose Route Frequency Provider Last Rate Last Admin   acetaminophen (TYLENOL) tablet 650 mg  650 mg Oral Q6H PRN Gawne, Meghan M, PA-C       alum & mag hydroxide-simeth (MAALOX/MYLANTA) 200-200-20 MG/5ML suspension 30 mL  30 mL Oral Q4H PRN Gawne, Meghan M, PA-C       anastrozole (ARIMIDEX) tablet 1 mg  1 mg Oral Daily Gawne,  Meghan M, PA-C   1 mg at 05/18/23 1238   apixaban (ELIQUIS) tablet 5 mg  5 mg Oral BID Calton Dach I, RPH   5 mg at 05/18/23 8119   atorvastatin (LIPITOR) tablet 10 mg  10 mg Oral QHS Levester Fresh M, PA-C   10 mg at 05/17/23 2146   bisacodyl (DULCOLAX) suppository 10 mg  10 mg Rectal Daily PRN Levester Fresh M, PA-C       calcium carbonate (OS-CAL - dosed in mg of elemental calcium)  tablet 1,250 mg  1,250 mg Oral Q breakfast Levester Fresh M, PA-C   1,250 mg at 05/18/23 1478   cholecalciferol (VITAMIN D3) 25 MCG (1000 UNIT) tablet 1,000 Units  1,000 Units Oral Daily Jenne Pane, PA-C   1,000 Units at 05/18/23 2956   docusate sodium (COLACE) capsule 100 mg  100 mg Oral BID Levester Fresh M, PA-C   100 mg at 05/18/23 2130   feeding supplement (ENSURE ENLIVE / ENSURE PLUS) liquid 237 mL  237 mL Oral BID BM Levester Fresh M, PA-C   237 mL at 05/18/23 1322   furosemide (LASIX) tablet 20 mg  20 mg Oral Daily PRN Gawne, Meghan M, PA-C       HYDROcodone-acetaminophen (NORCO/VICODIN) 5-325 MG per tablet 1-2 tablet  1-2 tablet Oral Q4H PRN Gawne, Meghan M, PA-C       insulin aspart (novoLOG) injection 0-9 Units  0-9 Units Subcutaneous Q4H Levester Fresh M, PA-C   3 Units at 05/18/23 1237   losartan (COZAAR) tablet 12.5 mg  12.5 mg Oral Daily Levester Fresh M, PA-C   12.5 mg at 05/18/23 8657   magnesium hydroxide (MILK OF MAGNESIA) suspension 30 mL  30 mL Oral Daily PRN Gawne, Meghan M, PA-C       menthol-cetylpyridinium (CEPACOL) lozenge 3 mg  1 lozenge Oral PRN Gawne, Meghan M, PA-C       Or   phenol (CHLORASEPTIC) mouth spray 1 spray  1 spray Mouth/Throat PRN Gawne, Meghan M, PA-C       methocarbamol (ROBAXIN) tablet 500 mg  500 mg Oral Q6H PRN Gawne, Meghan M, PA-C       Or   methocarbamol (ROBAXIN) injection 500 mg  500 mg Intravenous Q6H PRN Gawne, Meghan M, PA-C       metoCLOPramide (REGLAN) tablet 5-10 mg  5-10 mg Oral Q8H PRN Gawne, Meghan M, PA-C       Or   metoCLOPramide (REGLAN) injection 5-10 mg  5-10 mg Intravenous Q8H PRN Gawne, Meghan M, PA-C       metoprolol succinate (TOPROL-XL) 24 hr tablet 100 mg  100 mg Oral BID Levester Fresh M, PA-C   100 mg at 05/18/23 8469   morphine (PF) 2 MG/ML injection 0.5-1 mg  0.5-1 mg Intravenous Q2H PRN Levester Fresh M, PA-C   1 mg at 05/17/23 1622   mupirocin ointment (BACTROBAN) 2 %   Nasal BID Jenne Pane, PA-C   Given at 05/17/23  2146   ondansetron (ZOFRAN) tablet 4 mg  4 mg Oral Q6H PRN Levester Fresh M, PA-C       Or   ondansetron [facility]) injection 4 mg  4 mg Intravenous Q6H PRN Gawne, Meghan M, PA-C       oxyCODONE-acetaminophen (PERCOCET/ROXICET) 5-325 MG per tablet 1 tablet  1 tablet Oral Q4H PRN Jenne Pane, PA-C   1 tablet at 05/18/23 0949   traZODone (DESYREL) tablet 25 mg  25 mg Oral QHS PRN  Jenne Pane, PA-C         Discharge Medications: Please see discharge summary for a list of discharge medications.  Relevant Imaging Results:  Relevant Lab Results:   Additional Information SSN [id number]  Lorri Frederick, LCSW

## 2023-05-18 NOTE — Progress Notes (Signed)
Pharmacy consult to restart Eliquis if Hgb > 8 on POD1. Hgb this AM was 10.2 Eliquis 5mg  BID (as PTA for afib) reordered to start 2/14 AM.   Calton Dach, PharmD, BCCCP Clinical Pharmacist 05/18/2023 8:04 AM

## 2023-05-19 DIAGNOSIS — S72002A Fracture of unspecified part of neck of left femur, initial encounter for closed fracture: Secondary | ICD-10-CM | POA: Diagnosis not present

## 2023-05-19 DIAGNOSIS — S62102A Fracture of unspecified carpal bone, left wrist, initial encounter for closed fracture: Secondary | ICD-10-CM | POA: Diagnosis not present

## 2023-05-19 DIAGNOSIS — D72829 Elevated white blood cell count, unspecified: Secondary | ICD-10-CM | POA: Diagnosis not present

## 2023-05-19 DIAGNOSIS — I480 Paroxysmal atrial fibrillation: Secondary | ICD-10-CM | POA: Diagnosis not present

## 2023-05-19 LAB — CBC
HCT: 33 % — ABNORMAL LOW (ref 36.0–46.0)
Hemoglobin: 11.2 g/dL — ABNORMAL LOW (ref 12.0–15.0)
MCH: 31 pg (ref 26.0–34.0)
MCHC: 33.9 g/dL (ref 30.0–36.0)
MCV: 91.4 fL (ref 80.0–100.0)
Platelets: 174 10*3/uL (ref 150–400)
RBC: 3.61 MIL/uL — ABNORMAL LOW (ref 3.87–5.11)
RDW: 13.3 % (ref 11.5–15.5)
WBC: 9.8 10*3/uL (ref 4.0–10.5)
nRBC: 0 % (ref 0.0–0.2)

## 2023-05-19 LAB — GLUCOSE, CAPILLARY
Glucose-Capillary: 107 mg/dL — ABNORMAL HIGH (ref 70–99)
Glucose-Capillary: 120 mg/dL — ABNORMAL HIGH (ref 70–99)
Glucose-Capillary: 125 mg/dL — ABNORMAL HIGH (ref 70–99)
Glucose-Capillary: 132 mg/dL — ABNORMAL HIGH (ref 70–99)
Glucose-Capillary: 102 mg/dL — ABNORMAL HIGH (ref 70–99)
Glucose-Capillary: 142 mg/dL — ABNORMAL HIGH (ref 70–99)

## 2023-05-19 NOTE — Progress Notes (Signed)
ORTHO TRAUMA PROGRESS NOTE  Subjective: Patient doing okay this morning.  Resting comfortably in bed.  Notes some increased pain overnight but received pain medication somewhat recently and is doing better now.  Tolerating her wrist splint well.  Not having much pain in the wrist this morning.  Tolerating diet and fluids.  Urinating without issues.   No CP, SOB.  No other issues of note today.  Was able to mobilize out of bed with therapies yesterday but limited by nausea and urinary incontinence.  Objective:   VITALS:   Vitals:   05/18/23 0919 05/18/23 1452 05/18/23 2146 05/19/23 0400  BP: 119/64 92/62 98/66  117/64  Pulse: 87 90 81 94  Resp: 18 18 18 18   Temp: 98.9 F (37.2 C) 98.5 F (36.9 C) 98 F (36.7 C) 97.7 F (36.5 C)  TempSrc: Oral Oral Oral Oral  SpO2: 99% 99% 99% 98%  Weight:      Height:          Latest Ref Rng & Units 05/19/2023    5:36 AM 05/18/2023    6:20 AM 05/16/2023    9:38 PM  CBC  WBC 4.0 - 10.5 K/uL 9.8  9.4  16.1   Hemoglobin 12.0 - 15.0 g/dL 40.9  81.1  91.4   Hematocrit 36.0 - 46.0 % 33.0  30.2  40.4   Platelets 150 - 400 K/uL 174  146  227       Latest Ref Rng & Units 05/18/2023    6:20 AM 05/16/2023    9:38 PM 01/23/2023    3:21 PM  BMP  Glucose 70 - 99 mg/dL 782  956  213   BUN 8 - 23 mg/dL 23  23  16    Creatinine 0.44 - 1.00 mg/dL 0.86  5.78  4.69   Sodium 135 - 145 mmol/L 137  136  138   Potassium 3.5 - 5.1 mmol/L 3.7  4.2  3.6   Chloride 98 - 111 mmol/L 111  100  102   CO2 22 - 32 mmol/L 21  22  24    Calcium 8.9 - 10.3 mg/dL 7.2  62.9  9.9    9.6    Intake/Output      02/14 0701 02/15 0700 02/15 0701 02/16 0700   P.O. 480    I.V. (mL/kg)     IV Piggyback     Total Intake(mL/kg) 480 (7.5)    Urine (mL/kg/hr)     Blood     Total Output     Net +480            Physical Exam: General: NAD.  Laying in bed, calm, comfortable Resp: No increased wob ABD soft Neurologically intact MSK Left lower extremity: Dressings clean, dry,  intact.  Ankle DF/PF intact.  Sensation intact distally.  2+ DP pulse.   Left upper extremity: Removal of a splint in place.  Able to wiggle fingers.  Endorses sensation light touch over all aspects of the hand.  Some swelling and tenderness about the wrist.  Nontender through the forearm or elbow.  Neurovascularly intact.    Assessment: 2 Days Post-Op  S/P Procedure(s) (LRB): LEFT HIP PINNING (Left) by Dr. Jewel Baize. Eulah Pont on 05/17/23  Principal Problem:   Closed left hip fracture [facility]) Active Problems:   Essential hypertension   Breast cancer of upper-outer quadrant of left female breast (HCC)   Type 2 diabetes mellitus with chronic kidney disease, with long-term current use of insulin (HCC)   Dysuria  Dyslipidemia   Malnutrition of moderate degree   Plan: Treating L wrist as presumptive fracture although xrays were not completely conclusive. For now will non-op it and stay in a wrist brace until office f/u in 2 weeks   Advance diet Up with therapy Incentive Spirometry Elevate and Apply ice  Weightbearing: WBAT LLE, NWB LUE Insicional and dressing care: Dressings left intact until follow-up and Reinforce dressings as needed Orthopedic device(s):  left wrist brace Showering: Keep dressing dry VTE prophylaxis: Home dose Eliquis restarted, SCDs, ambulation Pain control: Continue multimodal pain control as needed Follow - up plan: 2 weeks after surgery with Dr. Adora Fridge information: Truitt Merle MD, Thyra Breed PA-C on 05/19/2023. After hours and holidays please check [email or web address] for group call information for [facility]   Dispo:  PT recommending SNF, patient agreeable.  TOC working on bed placement.    Thompson Caul PA-C [facility] [phone] (office) [email or web address]

## 2023-05-19 NOTE — Progress Notes (Signed)
PROGRESS NOTE    Lisa Thornton  ZOX:[id number] DOB: 06-May-1955 DOA: 05/16/2023 PCP: Anabel Halon, MD   Brief Narrative: Lisa Thornton is a 68 y.o. female with a history of paroxysmal atrial fibrillation, breast cancer status post modified radical mastectomy, OSA, primary hypertension.  Patient presented secondary to fall and resultant acute left hip and wrist pain and was found to have evidence of a left hip fracture in addition to a left distal radius fracture.  Orthopedic surgery consulted for surgical management.   Assessment and Plan:  Left hip fracture Secondary to fall.  Unilateral hip and pelvis x-ray on admission significant for an impacted subcapital left femoral neck fracture.  Orthopedic surgery consulted with recommendations for operative management.  Left wrist fracture Secondary to fall.  Wrist x-ray on admission significant for a possible subtle slightly impacted nondisplaced distal radius fracture.  Orthopedic surgery recommendations for nonoperative management with splint.  Recommendation for nonweightbearing, however okay to weight-bear as tolerated through elbow.  Paroxysmal atrial fibrillation Patient is in atrial fibrillation. Patient is managed on Eliquis and Toprol-XL as an outpatient.  Eliquis held on admission and restarted post-op. -Continue Toprol-XL and Eliquis  Leukocytosis Likely reactive secondary to acute fracture.  Dysuria Patient recently treated with Bactrim DS.  Urinalysis ordered and is still pending.  Primary hypertension Patient is on losartan and metoprolol succinate as an outpatient. -Continue metoprolol succinate and losartan  Diabetes mellitus type 2 Well-controlled with hemoglobin A1c of 5.9%. Patient is on Jardiance, metformin, semaglutide as an outpatient.  Hold outpatient regimen. -SSI  Postoperative anemia Perioperative blood loss Hemoglobin of 14.2 g/dL on admission, down to 81.1 g/dL post operatively.  Stable.  Hyperlipidemia -Continue Lipitor  Moderate malnutrition -Dietitian recommendations (2/13): Ensure Enlive po BID, each supplement provides 350 kcal and 20 grams of protein. Provided brief education on importance of protein intake and adequate calorie intake post-op Recommend room service with assist  Documented food preferences  Thrombocytopenia Mild. Likely secondary to blood loss. Resolved.   DVT prophylaxis: Eliquis Code Status:   Code Status: Full Code Family Communication: None at bedside Disposition Plan: Discharge to SNF pending stable hemoglobin, ongoing specialist recommendations   Consultants:  Orthopedic surgery  Procedures:  None  Antimicrobials: None   Subjective: Some leg pain. No other concerns.  Objective: BP 118/75 (BP Location: Right Arm)   Pulse 90   Temp 98.6 F (37 C) (Oral)   Resp 17   Ht 5\' 4"  (1.626 m)   Wt 64 kg   SpO2 97%   BMI 24.20 kg/m   Examination:  General exam: Appears calm and comfortable Respiratory system: Clear to auscultation. Respiratory effort normal. Cardiovascular system: S1 & S2 heard, irregular rhythm. Normal rate. Gastrointestinal system: Abdomen is nondistended, soft and nontender. Normal bowel sounds heard. Central nervous system: Alert and oriented. Musculoskeletal: No edema. No calf tenderness Skin: Left hip dressing intact and clean    Data Reviewed: I have personally reviewed following labs and imaging studies  CBC Lab Results  Component Value Date   WBC 9.8 05/19/2023   RBC 3.61 (L) 05/19/2023   HGB 11.2 (L) 05/19/2023   HCT 33.0 (L) 05/19/2023   MCV 91.4 05/19/2023   MCH 31.0 05/19/2023   PLT 174 05/19/2023   MCHC 33.9 05/19/2023   RDW 13.3 05/19/2023   LYMPHSABS 2.3 05/16/2023   MONOABS 0.8 05/16/2023   EOSABS 0.0 05/16/2023   BASOSABS 0.0 05/16/2023     Last metabolic panel Lab Results  Component Value  Date   NA 137 05/18/2023   K 3.7 05/18/2023   CL 111 05/18/2023    CO2 21 (L) 05/18/2023   BUN 23 05/18/2023   CREATININE 0.92 05/18/2023   GLUCOSE 117 (H) 05/18/2023   GFRNONAA >60 05/18/2023   GFRAA >60 10/20/2019   CALCIUM 7.2 (L) 05/18/2023   PHOS 3.4 07/06/2022   PROT 7.6 01/23/2023   ALBUMIN 2.8 (L) 05/18/2023   LABGLOB 3.8 12/12/2021   AGRATIO 1.1 (L) 12/12/2021   BILITOT 0.8 01/23/2023   ALKPHOS 65 01/23/2023   AST 36 01/23/2023   ALT 24 01/23/2023   ANIONGAP 5 05/18/2023    GFR: Estimated Creatinine Clearance: 49.8 mL/min (by C-G formula based on SCr of 0.92 mg/dL).  Recent Results (from the past 240 hours)  Surgical pcr screen     Status: Abnormal   Collection Time: 05/17/23  2:34 AM   Specimen: Nasal Mucosa; Nasal Swab  Result Value Ref Range Status   MRSA, PCR POSITIVE (A) NEGATIVE Final    Comment: RESULT CALLED TO, READ BACK BY AND VERIFIED WITH: I [email or web address],RN@[id number]  05/17/23 MK    Staphylococcus aureus POSITIVE (A) NEGATIVE Final    Comment: (NOTE) The Xpert SA Assay (FDA approved for NASAL specimens in patients 33 years of age and older), is one component of a comprehensive surveillance program. It is not intended to diagnose infection nor to guide or monitor treatment. Performed at [facility], [address] [place], [place] [address]       Radiology Studies: DG HIP UNILAT WITH PELVIS 2-3 VIEWS LEFT Result Date: 05/17/2023 CLINICAL DATA:  Elective surgery, pinning of hip fracture. EXAM: DG HIP (WITH OR WITHOUT PELVIS) 2-3V LEFT COMPARISON:  Preoperative imaging. FINDINGS: Two fluoroscopic spot views of the left hip submitted from the operating room. Three screws traverse left femoral neck fracture. Fluoroscopy time 51.9 seconds. Dose 8.1 mGy. IMPRESSION: Intraoperative fluoroscopy during left femoral neck fracture fixation. Electronically Signed   By: Narda Rutherford M.D.   On: 05/17/2023 15:36   DG C-Arm 1-60 Min-No Report Result Date: 05/17/2023 Fluoroscopy was utilized by the requesting physician.  No  radiographic interpretation.      LOS: 3 days    Jacquelin Hawking, MD [facility] 05/19/2023, 10:06 AM   If 7PM-7AM, please contact night-coverage [email or web address]

## 2023-05-19 NOTE — Evaluation (Signed)
Occupational Therapy Evaluation Patient Details Name: Lisa Thornton MRN: [id number] DOB: 1956-02-01 Today's Date: 05/19/2023   History of Present Illness   68 y/o female presents to [facility] on 2/13 after she slipped and fell at home. Xrays showed a left hip fx s/p left hip pinning and also has a left wrist fx (plan non-operative treatment). PMH includes anemia, hx of L breast cancer, chest pain, DM w/o complication, and HTN.     Clinical Impressions Pt states she couldn't sleep last night due to significant pain, pain meds helping today, now has mild pain. Pt lives alone, niece lives next door, has ramp to enter home. Niece can assist multiple times a day, takes care of 4 kids, doesn't work. Pt PLOF independent, Pt currently requires mod-max A for dressing/bathing due to R hand/wrist NWB precautions and unable to WB through LLE using L platform RW, mod A x2 for STS. Pt would benefit from postacute rehab <3hrs/day to maximize strength and functional mobility, will continue to see acutely.      If plan is discharge home, recommend the following:   A lot of help with walking and/or transfers;A lot of help with bathing/dressing/bathroom;Assistance with cooking/housework;Assist for transportation;Help with stairs or ramp for entrance     Functional Status Assessment   Patient has had a recent decline in their functional status and demonstrates the ability to make significant improvements in function in a reasonable and predictable amount of time.     Equipment Recommendations   Other (comment) (defer)     Recommendations for Other Services         Precautions/Restrictions   Precautions Precautions: Fall Restrictions Weight Bearing Restrictions Per Provider Order: Yes LUE Weight Bearing Per Provider Order: Weight bear through elbow only LLE Weight Bearing Per Provider Order: Weight bearing as tolerated     Mobility Bed Mobility Overal bed mobility: Needs Assistance Bed  Mobility: Supine to Sit     Supine to sit: Mod assist     General bed mobility comments: Assist for LLE negotiation out of bed and use of bed pad to scoot hips out to edge    Transfers Overall transfer level: Needs assistance Equipment used: Left platform walker Transfers: Sit to/from Stand Sit to Stand: Mod assist, +2 physical assistance           General transfer comment: ModA + 2 to power up to standing position. Verbal cues for R hand placement      Balance Overall balance assessment: Needs assistance Sitting-balance support: Single extremity supported, Feet supported Sitting balance-Leahy Scale: Fair Sitting balance - Comments: able to sit EOB once positioned performing ADLs   Standing balance support: Bilateral upper extremity supported Standing balance-Leahy Scale: Poor Standing balance comment: reliant on R PFRW                           ADL either performed or assessed with clinical judgement   ADL Overall ADL's : Needs assistance/impaired Eating/Feeding: Set up   Grooming: Set up;Sitting   Upper Body Bathing: Moderate assistance;Sitting   Lower Body Bathing: Moderate assistance;Sitting/lateral leans   Upper Body Dressing : Moderate assistance   Lower Body Dressing: Maximal assistance;Sit to/from stand   Toilet Transfer: Moderate assistance;+2 for physical assistance;BSC/3in1;Rolling walker (2 wheels);Stand-pivot   Toileting- Architect and Hygiene: Moderate assistance;Sitting/lateral lean         General ADL Comments: Pt NWB through L wrist/hand, limits BL ADLs. Pt has difficulty moving LLE  due to fx, limiting LB ADLs.     Vision Baseline Vision/History: 0 No visual deficits Ability to See in Adequate Light: 0 Adequate Patient Visual Report: No change from baseline       Perception         Praxis         Pertinent Vitals/Pain Pain Assessment Pain Assessment: 0-10 Pain Score: 2  Pain Location: L hip, L  wrist Pain Descriptors / Indicators: Discomfort, Operative site guarding, Grimacing Pain Intervention(s): Monitored during session     Extremity/Trunk Assessment Upper Extremity Assessment Upper Extremity Assessment: LUE deficits/detail LUE Deficits / Details: L distal radius fx, NWB through hand, okay to WB through elbow LUE Sensation: WNL LUE Coordination: decreased gross motor   Lower Extremity Assessment Lower Extremity Assessment: Defer to PT evaluation       Communication Communication Communication: No apparent difficulties   Cognition Arousal: Alert Behavior During Therapy: WFL for tasks assessed/performed Cognition: No apparent impairments                               Following commands: Intact       Cueing  General Comments          Exercises     Shoulder Instructions      Home Living Family/patient expects to be discharged to:: Private residence Living Arrangements: Alone Available Help at Discharge: Family;Available PRN/intermittently Type of Home: Mobile home Home Access: Ramped entrance     Home Layout: One level     Bathroom Shower/Tub: Tub only   Bathroom Toilet: Handicapped height     Home Equipment: Agricultural consultant (2 wheels);Shower seat;BSC/3in1   Additional Comments: Pt lives alone, niece lives next door who has 4 kids, can come assist throughout the day      Prior Functioning/Environment Prior Level of Function : Independent/Modified Independent                    OT Problem List: Decreased strength;Decreased range of motion;Decreased activity tolerance;Impaired balance (sitting and/or standing);Impaired UE functional use;Pain   OT Treatment/Interventions: Self-care/ADL training;Therapeutic exercise;Energy conservation;DME and/or AE instruction;Manual therapy;Therapeutic activities;Patient/family education;Balance training      OT Goals(Current goals can be found in the care plan section)   Acute Rehab  OT Goals Patient Stated Goal: to improve functional mobility OT Goal Formulation: With patient Time For Goal Achievement: 06/02/23 Potential to Achieve Goals: Good   OT Frequency:  Min 1X/week    Co-evaluation              AM-PAC OT "6 Clicks" Daily Activity     Outcome Measure Help from another person eating meals?: A Little Help from another person taking care of personal grooming?: A Little Help from another person toileting, which includes using toliet, bedpan, or urinal?: A Lot Help from another person bathing (including washing, rinsing, drying)?: A Lot Help from another person to put on and taking off regular upper body clothing?: A Lot Help from another person to put on and taking off regular lower body clothing?: A Lot 6 Click Score: 14   End of Session Equipment Utilized During Treatment: Gait belt;Other (comment) (L PFRW) Nurse Communication: Mobility status  Activity Tolerance: Patient tolerated treatment well Patient left: in bed;with call bell/phone within reach;with bed alarm set  OT Visit Diagnosis: Unsteadiness on feet (R26.81);Other abnormalities of gait and mobility (R26.89);Muscle weakness (generalized) (M62.81);Pain Pain - Right/Left: Left Pain - part of body:  Hand;Leg                Time: 1610-9604 OT Time Calculation (min): 27 min Charges:  OT General Charges $OT Visit: 1 Visit OT Evaluation $OT Eval Moderate Complexity: 1 Mod OT Treatments $Self Care/Home Management : 8-22 mins  [address], OTR/L   Alexis Goodell 05/19/2023, 1:26 PM

## 2023-05-19 NOTE — Plan of Care (Signed)
   Problem: Activity: Goal: Risk for activity intolerance will decrease Outcome: Progressing   Problem: Nutrition: Goal: Adequate nutrition will be maintained Outcome: Progressing   Problem: Coping: Goal: Level of anxiety will decrease Outcome: Progressing

## 2023-05-20 DIAGNOSIS — I480 Paroxysmal atrial fibrillation: Secondary | ICD-10-CM | POA: Diagnosis not present

## 2023-05-20 DIAGNOSIS — S62102A Fracture of unspecified carpal bone, left wrist, initial encounter for closed fracture: Secondary | ICD-10-CM | POA: Diagnosis not present

## 2023-05-20 DIAGNOSIS — S72002A Fracture of unspecified part of neck of left femur, initial encounter for closed fracture: Secondary | ICD-10-CM | POA: Diagnosis not present

## 2023-05-20 DIAGNOSIS — D72829 Elevated white blood cell count, unspecified: Secondary | ICD-10-CM | POA: Diagnosis not present

## 2023-05-20 LAB — GLUCOSE, CAPILLARY
Glucose-Capillary: 138 mg/dL — ABNORMAL HIGH (ref 70–99)
Glucose-Capillary: 148 mg/dL — ABNORMAL HIGH (ref 70–99)
Glucose-Capillary: 161 mg/dL — ABNORMAL HIGH (ref 70–99)
Glucose-Capillary: 170 mg/dL — ABNORMAL HIGH (ref 70–99)
Glucose-Capillary: 196 mg/dL — ABNORMAL HIGH (ref 70–99)
Glucose-Capillary: 217 mg/dL — ABNORMAL HIGH (ref 70–99)

## 2023-05-20 MED ORDER — SODIUM CHLORIDE 0.9% FLUSH
10.0000 mL | INTRAVENOUS | Status: DC | PRN
  Administered 2023-05-20 (×2): 10 mL

## 2023-05-20 MED ORDER — OXYCODONE-ACETAMINOPHEN 5-325 MG PO TABS: 1.0000 | ORAL_TABLET | Freq: Four times a day (QID) | ORAL | 0 refills | Status: DC | PRN

## 2023-05-20 MED ORDER — SODIUM CHLORIDE 0.9% FLUSH
10.0000 mL | Freq: Two times a day (BID) | INTRAVENOUS | Status: DC
Start: 2023-05-20 — End: 2023-05-22
  Administered 2023-05-20: 20 mL
  Administered 2023-05-21: 10 mL

## 2023-05-20 MED ORDER — CHLORHEXIDINE GLUCONATE CLOTH 2 % EX PADS
6.0000 | MEDICATED_PAD | Freq: Every day | CUTANEOUS | Status: DC
  Administered 2023-05-20 – 2023-05-21 (×2): 6 via TOPICAL

## 2023-05-20 NOTE — Progress Notes (Signed)
PROGRESS NOTE    Lisa Thornton  ZOX:[id number] DOB: 1955/06/11 DOA: 05/16/2023 PCP: Anabel Halon, MD   Brief Narrative: Lisa Thornton is a 68 y.o. female with a history of paroxysmal atrial fibrillation, breast cancer status post modified radical mastectomy, OSA, primary hypertension.  Patient presented secondary to fall and resultant acute left hip and wrist pain and was found to have evidence of a left hip fracture in addition to a left distal radius fracture.  Orthopedic surgery consulted for surgical management.   Assessment and Plan:  Left hip fracture Secondary to fall.  Unilateral hip and pelvis x-ray on admission significant for an impacted subcapital left femoral neck fracture.  Orthopedic surgery consulted with recommendations for operative management.  Left wrist fracture Secondary to fall.  Wrist x-ray on admission significant for a possible subtle slightly impacted nondisplaced distal radius fracture.  Orthopedic surgery recommendations for nonoperative management with splint.  Recommendation for nonweightbearing, however okay to weight-bear as tolerated through elbow.  Paroxysmal atrial fibrillation Patient is in atrial fibrillation. Patient is managed on Eliquis and Toprol-XL as an outpatient.  Eliquis held on admission and restarted post-op. -Continue Toprol-XL and Eliquis  Leukocytosis Likely reactive secondary to acute fracture.  Dysuria Patient recently treated with Bactrim DS.  Urinalysis ordered and is still pending.  Primary hypertension Patient is on losartan and metoprolol succinate as an outpatient. -Continue metoprolol succinate and losartan  Diabetes mellitus type 2 Well-controlled with hemoglobin A1c of 5.9%. Patient is on Jardiance, metformin, semaglutide as an outpatient.  Hold outpatient regimen. -SSI  Postoperative anemia Perioperative blood loss Hemoglobin of 14.2 g/dL on admission, down to 81.1 g/dL post operatively.  Stable.  Hyperlipidemia -Continue Lipitor  Moderate malnutrition -Dietitian recommendations (2/13): Ensure Enlive po BID, each supplement provides 350 kcal and 20 grams of protein. Provided brief education on importance of protein intake and adequate calorie intake post-op Recommend room service with assist  Documented food preferences  Thrombocytopenia Mild. Likely secondary to blood loss. Resolved.   DVT prophylaxis: Eliquis Code Status:   Code Status: Full Code Family Communication: None at bedside Disposition Plan: Discharge to SNF pending stable hemoglobin, ongoing specialist recommendations   Consultants:  Orthopedic surgery  Procedures:  None  Antimicrobials: None   Subjective: Pain is improved today.  Objective: BP (!) 150/87 (BP Location: Right Arm)   Pulse 87   Temp 98.4 F (36.9 C) (Oral)   Resp 14   Ht 5\' 4"  (1.626 m)   Wt 64 kg   SpO2 97%   BMI 24.20 kg/m   Examination:  General exam: Appears calm and comfortable   Data Reviewed: I have personally reviewed following labs and imaging studies  CBC Lab Results  Component Value Date   WBC 9.8 05/19/2023   RBC 3.61 (L) 05/19/2023   HGB 11.2 (L) 05/19/2023   HCT 33.0 (L) 05/19/2023   MCV 91.4 05/19/2023   MCH 31.0 05/19/2023   PLT 174 05/19/2023   MCHC 33.9 05/19/2023   RDW 13.3 05/19/2023   LYMPHSABS 2.3 05/16/2023   MONOABS 0.8 05/16/2023   EOSABS 0.0 05/16/2023   BASOSABS 0.0 05/16/2023     Last metabolic panel Lab Results  Component Value Date   NA 137 05/18/2023   K 3.7 05/18/2023   CL 111 05/18/2023   CO2 21 (L) 05/18/2023   BUN 23 05/18/2023   CREATININE 0.92 05/18/2023   GLUCOSE 117 (H) 05/18/2023   GFRNONAA >60 05/18/2023   GFRAA >60 10/20/2019   CALCIUM  7.2 (L) 05/18/2023   PHOS 3.4 07/06/2022   PROT 7.6 01/23/2023   ALBUMIN 2.8 (L) 05/18/2023   LABGLOB 3.8 12/12/2021   AGRATIO 1.1 (L) 12/12/2021   BILITOT 0.8 01/23/2023   ALKPHOS 65 01/23/2023   AST 36  01/23/2023   ALT 24 01/23/2023   ANIONGAP 5 05/18/2023    GFR: Estimated Creatinine Clearance: 49.8 mL/min (by C-G formula based on SCr of 0.92 mg/dL).  Recent Results (from the past 240 hours)  Surgical pcr screen     Status: Abnormal   Collection Time: 05/17/23  2:34 AM   Specimen: Nasal Mucosa; Nasal Swab  Result Value Ref Range Status   MRSA, PCR POSITIVE (A) NEGATIVE Final    Comment: RESULT CALLED TO, READ BACK BY AND VERIFIED WITH: I AKOETEY,RN@[id number]  05/17/23 MK    Staphylococcus aureus POSITIVE (A) NEGATIVE Final    Comment: (NOTE) The Xpert SA Assay (FDA approved for NASAL specimens in patients 25 years of age and older), is one component of a comprehensive surveillance program. It is not intended to diagnose infection nor to guide or monitor treatment. Performed at [facility], [address], [place], [place] [address]       Radiology Studies: No results found.     LOS: 4 days    Jacquelin Hawking, MD [facility] 05/20/2023, 11:42 AM   If 7PM-7AM, please contact night-coverage [email or web address]

## 2023-05-20 NOTE — Plan of Care (Signed)
   Problem: Education: Goal: Knowledge of General Education information will improve Description: Including pain rating scale, medication(s)/side effects and non-pharmacologic comfort measures Outcome: Progressing   Problem: Nutrition: Goal: Adequate nutrition will be maintained Outcome: Progressing

## 2023-05-20 NOTE — Progress Notes (Signed)
Subjective: Patient reports pain as mild to moderate in both hip and wrist.  Tolerating diet.  Urinating.   No CP, SOB.  Has continued trouble mobilizing OOB with PT/OT due to pain and weakness. She has deficits from injuries to the LUE and LLE.   Objective:   VITALS:   Vitals:   05/19/23 1533 05/19/23 2019 05/20/23 0316 05/20/23 0748  BP: 124/69 126/69 127/68 (!) 150/87  Pulse: 99 (!) 106 97 87  Resp: 16 16 17 14   Temp: 98.5 F (36.9 C) 99.3 F (37.4 C) 99.2 F (37.3 C) 98.4 F (36.9 C)  TempSrc: Oral Oral Oral Oral  SpO2: 98% 100% 96% 97%  Weight:      Height:          Latest Ref Rng & Units 05/19/2023    5:36 AM 05/18/2023    6:20 AM 05/16/2023    9:38 PM  CBC  WBC 4.0 - 10.5 K/uL 9.8  9.4  16.1   Hemoglobin 12.0 - 15.0 g/dL 13.0  86.5  78.4   Hematocrit 36.0 - 46.0 % 33.0  30.2  40.4   Platelets 150 - 400 K/uL 174  146  227       Latest Ref Rng & Units 05/18/2023    6:20 AM 05/16/2023    9:38 PM 01/23/2023    3:21 PM  BMP  Glucose 70 - 99 mg/dL 696  295  284   BUN 8 - 23 mg/dL 23  23  16    Creatinine 0.44 - 1.00 mg/dL 1.32  4.40  1.02   Sodium 135 - 145 mmol/L 137  136  138   Potassium 3.5 - 5.1 mmol/L 3.7  4.2  3.6   Chloride 98 - 111 mmol/L 111  100  102   CO2 22 - 32 mmol/L 21  22  24    Calcium 8.9 - 10.3 mg/dL 7.2  72.5  9.9    9.6    Intake/Output      02/15 0701 02/16 0700 02/16 0701 02/17 0700   P.O. 720 240   Total Intake(mL/kg) 720 (11.3) 240 (3.8)   Urine (mL/kg/hr) 1400 (0.9)    Total Output 1400    Net -680 +240           Physical Exam: General: NAD.  Laying in bed, sleeping comfortably, easily awoken Resp: No increased wob Cardio: regular rate and rhythm ABD soft Neurologically intact MSK Neurovascularly intact Sensation intact distally Intact pulses distally Dorsiflexion/Plantar flexion intact Incision: dressing C/D/I  L wrist TTP  Edema present  ROM to fingers intact Brace in place    Assessment: 3 Days Post-Op  S/P  Procedure(s) (LRB): LEFT HIP PINNING (Left) by Dr. Jewel Baize. Eulah Pont on 05/17/23  Principal Problem:   Closed left hip fracture [facility]) Active Problems:   Essential hypertension   Breast cancer of upper-outer quadrant of left female breast (HCC)   Type 2 diabetes mellitus with chronic kidney disease, with long-term current use of insulin (HCC)   Dysuria   Dyslipidemia   Malnutrition of moderate degree   Plan: Treating L wrist as presumptive fracture although xrays were not completely conclusive. For now will non-op it and stay in a wrist brace until office f/u in 2 weeks   Advance diet Up with therapy Incentive Spirometry Elevate and Apply ice  Weightbearing: WBAT LLE, NWB LUE Insicional and dressing care: Dressings left intact until follow-up and Reinforce dressings as needed Orthopedic device(s):  left wrist brace  Showering: Keep dressing dry VTE prophylaxis:  Eliquis 5mg  bid at baseline restarted today  , SCDs, ambulation Pain control: PRN Follow - up plan: 2 weeks after surgery Contact information:  Margarita Rana MD, Levester Fresh PA-C  Dispo:  PT recommending SNF.  Will print and sign script for pain medicine. Ok to d/c from ortho standpoint when medically ready.     Jenne Pane, PA-C Office [phone] 05/20/2023, 12:05 PM

## 2023-05-21 ENCOUNTER — Encounter (HOSPITAL_COMMUNITY): Payer: Self-pay | Admitting: Orthopedic Surgery

## 2023-05-21 DIAGNOSIS — S62102A Fracture of unspecified carpal bone, left wrist, initial encounter for closed fracture: Secondary | ICD-10-CM | POA: Insufficient documentation

## 2023-05-21 DIAGNOSIS — S52502D Unspecified fracture of the lower end of left radius, subsequent encounter for closed fracture with routine healing: Secondary | ICD-10-CM | POA: Diagnosis not present

## 2023-05-21 DIAGNOSIS — I5032 Chronic diastolic (congestive) heart failure: Secondary | ICD-10-CM | POA: Diagnosis not present

## 2023-05-21 DIAGNOSIS — S72002A Fracture of unspecified part of neck of left femur, initial encounter for closed fracture: Secondary | ICD-10-CM | POA: Diagnosis not present

## 2023-05-21 DIAGNOSIS — I429 Cardiomyopathy, unspecified: Secondary | ICD-10-CM | POA: Diagnosis not present

## 2023-05-21 DIAGNOSIS — Z7901 Long term (current) use of anticoagulants: Secondary | ICD-10-CM | POA: Diagnosis not present

## 2023-05-21 DIAGNOSIS — I480 Paroxysmal atrial fibrillation: Secondary | ICD-10-CM | POA: Diagnosis not present

## 2023-05-21 DIAGNOSIS — C50912 Malignant neoplasm of unspecified site of left female breast: Secondary | ICD-10-CM | POA: Diagnosis not present

## 2023-05-21 DIAGNOSIS — G9341 Metabolic encephalopathy: Secondary | ICD-10-CM | POA: Diagnosis not present

## 2023-05-21 DIAGNOSIS — E1143 Type 2 diabetes mellitus with diabetic autonomic (poly)neuropathy: Secondary | ICD-10-CM | POA: Diagnosis not present

## 2023-05-21 DIAGNOSIS — Z9010 Acquired absence of unspecified breast and nipple: Secondary | ICD-10-CM | POA: Diagnosis not present

## 2023-05-21 DIAGNOSIS — Z743 Need for continuous supervision: Secondary | ICD-10-CM | POA: Diagnosis not present

## 2023-05-21 DIAGNOSIS — M6281 Muscle weakness (generalized): Secondary | ICD-10-CM | POA: Diagnosis not present

## 2023-05-21 DIAGNOSIS — R2681 Unsteadiness on feet: Secondary | ICD-10-CM | POA: Diagnosis not present

## 2023-05-21 DIAGNOSIS — R300 Dysuria: Secondary | ICD-10-CM | POA: Diagnosis not present

## 2023-05-21 DIAGNOSIS — E782 Mixed hyperlipidemia: Secondary | ICD-10-CM | POA: Diagnosis not present

## 2023-05-21 DIAGNOSIS — I10 Essential (primary) hypertension: Secondary | ICD-10-CM | POA: Diagnosis not present

## 2023-05-21 DIAGNOSIS — E1122 Type 2 diabetes mellitus with diabetic chronic kidney disease: Secondary | ICD-10-CM | POA: Diagnosis not present

## 2023-05-21 DIAGNOSIS — E440 Moderate protein-calorie malnutrition: Secondary | ICD-10-CM | POA: Diagnosis not present

## 2023-05-21 DIAGNOSIS — Z4789 Encounter for other orthopedic aftercare: Secondary | ICD-10-CM | POA: Diagnosis not present

## 2023-05-21 DIAGNOSIS — G4733 Obstructive sleep apnea (adult) (pediatric): Secondary | ICD-10-CM | POA: Diagnosis not present

## 2023-05-21 DIAGNOSIS — E785 Hyperlipidemia, unspecified: Secondary | ICD-10-CM | POA: Diagnosis not present

## 2023-05-21 DIAGNOSIS — I4891 Unspecified atrial fibrillation: Secondary | ICD-10-CM | POA: Diagnosis not present

## 2023-05-21 DIAGNOSIS — R1312 Dysphagia, oropharyngeal phase: Secondary | ICD-10-CM | POA: Diagnosis not present

## 2023-05-21 DIAGNOSIS — W19XXXS Unspecified fall, sequela: Secondary | ICD-10-CM | POA: Diagnosis not present

## 2023-05-21 DIAGNOSIS — L89610 Pressure ulcer of right heel, unstageable: Secondary | ICD-10-CM | POA: Diagnosis not present

## 2023-05-21 DIAGNOSIS — S72002D Fracture of unspecified part of neck of left femur, subsequent encounter for closed fracture with routine healing: Secondary | ICD-10-CM | POA: Diagnosis not present

## 2023-05-21 DIAGNOSIS — R531 Weakness: Secondary | ICD-10-CM | POA: Diagnosis not present

## 2023-05-21 DIAGNOSIS — Z794 Long term (current) use of insulin: Secondary | ICD-10-CM | POA: Diagnosis not present

## 2023-05-21 DIAGNOSIS — D649 Anemia, unspecified: Secondary | ICD-10-CM | POA: Diagnosis not present

## 2023-05-21 DIAGNOSIS — Z741 Need for assistance with personal care: Secondary | ICD-10-CM | POA: Diagnosis not present

## 2023-05-21 DIAGNOSIS — L89616 Pressure-induced deep tissue damage of right heel: Secondary | ICD-10-CM | POA: Diagnosis not present

## 2023-05-21 DIAGNOSIS — D509 Iron deficiency anemia, unspecified: Secondary | ICD-10-CM | POA: Diagnosis not present

## 2023-05-21 DIAGNOSIS — D696 Thrombocytopenia, unspecified: Secondary | ICD-10-CM | POA: Diagnosis not present

## 2023-05-21 DIAGNOSIS — B9562 Methicillin resistant Staphylococcus aureus infection as the cause of diseases classified elsewhere: Secondary | ICD-10-CM | POA: Diagnosis not present

## 2023-05-21 DIAGNOSIS — I509 Heart failure, unspecified: Secondary | ICD-10-CM | POA: Diagnosis not present

## 2023-05-21 LAB — GLUCOSE, CAPILLARY
Glucose-Capillary: 185 mg/dL — ABNORMAL HIGH (ref 70–99)
Glucose-Capillary: 189 mg/dL — ABNORMAL HIGH (ref 70–99)
Glucose-Capillary: 160 mg/dL — ABNORMAL HIGH (ref 70–99)
Glucose-Capillary: 214 mg/dL — ABNORMAL HIGH (ref 70–99)

## 2023-05-21 LAB — CALCIUM: Calcium: 9.1 mg/dL (ref 8.9–10.3)

## 2023-05-21 LAB — ALBUMIN: Albumin: 2.6 g/dL — ABNORMAL LOW (ref 3.5–5.0)

## 2023-05-21 MED ORDER — HEPARIN SOD (PORK) LOCK FLUSH 100 UNIT/ML IV SOLN
500.0000 [IU] | INTRAVENOUS | Status: AC | PRN
  Administered 2023-05-21: 500 [IU]

## 2023-05-21 MED ORDER — ENSURE ENLIVE PO LIQD: 237.0000 mL | Freq: Two times a day (BID) | ORAL | Status: AC

## 2023-05-21 NOTE — Progress Notes (Signed)
AVS printed and with chart for PTAR

## 2023-05-21 NOTE — Plan of Care (Signed)
  Problem: Education: Goal: Knowledge of General Education information will improve Description: Including pain rating scale, medication(s)/side effects and non-pharmacologic comfort measures Outcome: Progressing   Problem: Health Behavior/Discharge Planning: Goal: Ability to manage health-related needs will improve Outcome: Progressing   Problem: Clinical Measurements: Goal: Ability to maintain clinical measurements within normal limits will improve Outcome: Progressing Goal: Will remain free from infection Outcome: Progressing Goal: Diagnostic test results will improve Outcome: Progressing Goal: Respiratory complications will improve Outcome: Progressing Goal: Cardiovascular complication will be avoided Outcome: Progressing   Problem: Activity: Goal: Risk for activity intolerance will decrease Outcome: Progressing   Problem: Nutrition: Goal: Adequate nutrition will be maintained Outcome: Progressing   Problem: Coping: Goal: Level of anxiety will decrease Outcome: Progressing   Problem: Elimination: Goal: Will not experience complications related to bowel motility Outcome: Progressing Goal: Will not experience complications related to urinary retention Outcome: Progressing   Problem: Pain Managment: Goal: General experience of comfort will improve and/or be controlled Outcome: Progressing   Problem: Safety: Goal: Ability to remain free from injury will improve Outcome: Progressing   Problem: Skin Integrity: Goal: Risk for impaired skin integrity will decrease Outcome: Progressing   Problem: Education: Goal: Ability to describe self-care measures that may prevent or decrease complications (Diabetes Survival Skills Education) will improve Outcome: Progressing Goal: Individualized Educational Video(s) Outcome: Progressing   Problem: Fluid Volume: Goal: Ability to maintain a balanced intake and output will improve Outcome: Progressing   Problem: Health  Behavior/Discharge Planning: Goal: Ability to identify and utilize available resources and services will improve Outcome: Progressing Goal: Ability to manage health-related needs will improve Outcome: Progressing   Problem: Metabolic: Goal: Ability to maintain appropriate glucose levels will improve Outcome: Progressing   Problem: Nutritional: Goal: Maintenance of adequate nutrition will improve Outcome: Progressing Goal: Progress toward achieving an optimal weight will improve Outcome: Progressing   Problem: Skin Integrity: Goal: Risk for impaired skin integrity will decrease Outcome: Progressing   Problem: Tissue Perfusion: Goal: Adequacy of tissue perfusion will improve Outcome: Progressing   Problem: Education: Goal: Verbalization of understanding the information provided (i.e., activity precautions, restrictions, etc) will improve Outcome: Progressing Goal: Individualized Educational Video(s) Outcome: Progressing   Problem: Activity: Goal: Ability to ambulate and perform ADLs will improve Outcome: Progressing   Problem: Clinical Measurements: Goal: Postoperative complications will be avoided or minimized Outcome: Progressing   Problem: Self-Concept: Goal: Ability to maintain and perform role responsibilities to the fullest extent possible will improve Outcome: Progressing   Problem: Pain Management: Goal: Pain level will decrease Outcome: Progressing

## 2023-05-21 NOTE — TOC Transition Note (Signed)
Transition of Care [facility]) - Discharge Note   Patient Details  Name: Lisa Thornton MRN: [id number] Date of Birth: 11/29/1955  [facility]) CM/SW Contact:  Lorri Frederick, LCSW Phone Number: 05/21/2023, 2:58 PM   Clinical Narrative:   Pt discharging to [facility], room 504-A. RN call report to [phone].      Final next level of care: Skilled Nursing Facility Barriers to Discharge: Barriers Resolved   Patient Goals and CMS Choice            Discharge Placement              Patient chooses bed at:  [facility] rehab) Patient to be transferred to facility by: ptar Name of family member notified: niece rebekah Patient and family notified of of transfer: 05/21/23  Discharge Plan and Services Additional resources added to the After Visit Summary for     Discharge Planning Services: CM Consult                                 Social Drivers of Health (SDOH) Interventions SDOH Screenings   Food Insecurity: No Food Insecurity (05/17/2023)  Housing: Low Risk  (05/17/2023)  Transportation Needs: No Transportation Needs (05/17/2023)  Utilities: Not At Risk (05/17/2023)  Alcohol Screen: Low Risk  (03/25/2020)  Depression (PHQ2-9): Low Risk  (01/22/2023)  Financial Resource Strain: Low Risk  (03/29/2021)  Physical Activity: Insufficiently Active (03/29/2021)  Social Connections: Socially Isolated (05/17/2023)  Stress: No Stress Concern Present (03/29/2021)  Tobacco Use: Low Risk  (04/28/2023)     Readmission Risk Interventions     No data to display

## 2023-05-21 NOTE — Progress Notes (Signed)
Physical Therapy Treatment Patient Details Name: Lisa Thornton MRN: [id number] DOB: Mar 04, 1956 Today's Date: 05/21/2023   History of Present Illness 68 y/o female presents to [facility] on 2/13 after she slipped and fell at home. Xrays showed a left hip fx s/p left hip pinning and also has a left wrist fx (plan non-operative treatment). PMH includes anemia, hx of L breast cancer, chest pain, DM w/o complication, and HTN.    PT Comments  Pt progressing towards her physical therapy goals. Session focused on reviewing weightbearing precautions, transfer and gait training. Pt requiring two person moderate assist to transition to standing position and ambulating 5 ft x 2 with a L platform RW and chair follow. Needs dense multimodal cueing for stepping advancement and initiation. Patient will benefit from continued inpatient follow up therapy, <3 hours/day to address deficits and maximize functional mobility.    If plan is discharge home, recommend the following: A lot of help with walking and/or transfers;A lot of help with bathing/dressing/bathroom   Can travel by private vehicle     No  Equipment Recommendations  BSC/3in1;Wheelchair (measurements PT);Wheelchair cushion (measurements PT);Other (comment) (PFRW)    Recommendations for Other Services       Precautions / Restrictions Precautions Precautions: Fall Restrictions Weight Bearing Restrictions Per Provider Order: Yes LUE Weight Bearing Per Provider Order: Weight bear through elbow only LLE Weight Bearing Per Provider Order: Weight bearing as tolerated     Mobility  Bed Mobility Overal bed mobility: Needs Assistance Bed Mobility: Supine to Sit     Supine to sit: Mod assist     General bed mobility comments: Assist for LLE negotiation out of bed, trunk assist to upright and use of bed pad to scoot hips forward    Transfers Overall transfer level: Needs assistance Equipment used: Left platform walker Transfers: Sit to/from  Stand Sit to Stand: Mod assist, +2 physical assistance           General transfer comment: ModA + 2 to power up to standing position. Verbal and hand over hand cues for R hand placement.    Ambulation/Gait Ambulation/Gait assistance: Min assist, +2 safety/equipment Gait Distance (Feet): 10 Feet (5", 5") Assistive device: Right platform walker Gait Pattern/deviations: Step-to pattern, Decreased stance time - left, Decreased weight shift to left, Trunk flexed Gait velocity: decreased Gait velocity interpretation: <1.31 ft/sec, indicative of household ambulator   General Gait Details: Verbal and tactile cues for upright posture, sequencing/technique. Intermittent manual assist for forward progression of RLE. Narrow BOS throughout. Chair follow utilized and minA for standing balance. One seated rest break in between walking bouts.   Stairs             Wheelchair Mobility     Tilt Bed    Modified Rankin (Stroke Patients Only)       Balance Overall balance assessment: Needs assistance Sitting-balance support: Single extremity supported, Feet supported Sitting balance-Leahy Scale: Fair     Standing balance support: Bilateral upper extremity supported Standing balance-Leahy Scale: Poor Standing balance comment: reliant on R PFRW                            Communication Communication Communication: No apparent difficulties  Cognition Arousal: Alert Behavior During Therapy: WFL for tasks assessed/performed   PT - Cognitive impairments: No family/caregiver present to determine baseline  PT - Cognition Comments: Poor recall and carryover with instructional cueing Following commands: Intact      Cueing    Exercises      General Comments        Pertinent Vitals/Pain Pain Assessment Pain Assessment: Faces Pain Location: L hip, L wrist Pain Descriptors / Indicators: Discomfort, Operative site guarding, Grimacing Pain  Intervention(s): Limited activity within patient's tolerance, Monitored during session    Home Living                          Prior Function            PT Goals (current goals can now be found in the care plan section) Acute Rehab PT Goals Patient Stated Goal: return to baseline Potential to Achieve Goals: Good Progress towards PT goals: Progressing toward goals    Frequency    Min 1X/week      PT Plan      Co-evaluation              AM-PAC PT "6 Clicks" Mobility   Outcome Measure  Help needed turning from your back to your side while in a flat bed without using bedrails?: A Little Help needed moving from lying on your back to sitting on the side of a flat bed without using bedrails?: A Lot Help needed moving to and from a bed to a chair (including a wheelchair)?: A Lot Help needed standing up from a chair using your arms (e.g., wheelchair or bedside chair)?: A Lot Help needed to walk in hospital room?: Total Help needed climbing 3-5 steps with a railing? : Total 6 Click Score: 11    End of Session Equipment Utilized During Treatment: Gait belt Activity Tolerance: Patient tolerated treatment well Patient left: in chair;with call bell/phone within reach;Other (comment) (chair alarm doesn't work Banker notified)) Nurse Communication: Mobility status PT Visit Diagnosis: Unsteadiness on feet (R26.81);History of falling (Z91.81);Difficulty in walking, not elsewhere classified (R26.2);Pain Pain - Right/Left: Left Pain - part of body: Hip     Time: 1126-1209 PT Time Calculation (min) (ACUTE ONLY): 43 min  Charges:    $Gait Training: 8-22 mins $Therapeutic Activity: 23-37 mins PT General Charges $$ ACUTE PT VISIT: 1 Visit                     Lillia Pauls, PT, DPT Acute Rehabilitation Services Office [phone]    Norval Morton 05/21/2023, 1:09 PM

## 2023-05-21 NOTE — Progress Notes (Signed)
PROGRESS NOTE    [name]  ZOX:[id number] DOB: December 12, 1955 DOA: 05/16/2023 PCP: Anabel Halon, MD   Brief Narrative: Lisa Thornton is a 68 y.o. female with a history of paroxysmal atrial fibrillation, breast cancer status post modified radical mastectomy, OSA, primary hypertension.  Patient presented secondary to fall and resultant acute left hip and wrist pain and was found to have evidence of a left hip fracture in addition to a left distal radius fracture.  Orthopedic surgery consulted for surgical management and performed a left hip pinning.   Assessment and Plan:  Left hip fracture Secondary to fall.  Unilateral hip and pelvis x-ray on admission significant for an impacted subcapital left femoral neck fracture.  Orthopedic surgery consulted with recommendations for operative management. Patient underwent left hip pinning on 2/13. Patient is weight bearing as tolerated LLE.   Left wrist fracture Secondary to fall.  Wrist x-ray on admission significant for a possible subtle slightly impacted nondisplaced distal radius fracture.  Orthopedic surgery recommendations for nonoperative management with splint.  Recommendation for nonweightbearing LUE.  Paroxysmal atrial fibrillation Patient is in atrial fibrillation. Patient is managed on Eliquis and Toprol-XL as an outpatient.  Eliquis held on admission and restarted post-op. -Continue Toprol-XL and Eliquis  Leukocytosis Likely reactive secondary to acute fracture.  Dysuria Patient recently treated with Bactrim DS.  Urinalysis ordered and is still pending.  Primary hypertension Patient is on losartan and metoprolol succinate as an outpatient. -Continue metoprolol succinate and losartan  Diabetes mellitus type 2 Well-controlled with hemoglobin A1c of 5.9%. Patient is on Jardiance, metformin, semaglutide as an outpatient.  Hold outpatient regimen. -SSI  Postoperative anemia Perioperative blood loss Hemoglobin of 14.2 g/dL on  admission, down to 10.2 g/dL post operatively. Stable.  Hyperlipidemia -Continue Lipitor  Moderate malnutrition -Dietitian recommendations (2/13): Ensure Enlive po BID, each supplement provides 350 kcal and 20 grams of protein. Provided brief education on importance of protein intake and adequate calorie intake post-op Recommend room service with assist  Documented food preferences  Thrombocytopenia Mild. Likely secondary to blood loss. Resolved.   DVT prophylaxis: Eliquis Code Status:   Code Status: Full Code Family Communication: None at bedside Disposition Plan: Discharge to SNF pending bed availability   Consultants:  Orthopedic surgery  Procedures:  2/13: Left hip pinning  Antimicrobials: None   Subjective: No issues noted from overnight.  Objective: BP 121/63   Pulse (!) 101   Temp 98.1 F (36.7 C)   Resp 17   Ht 5\' 4"  (1.626 m)   Wt 64 kg   SpO2 97%   BMI 24.20 kg/m   Examination:  General exam: Appears calm and comfortable    Data Reviewed: I have personally reviewed following labs and imaging studies  CBC Lab Results  Component Value Date   WBC 9.8 05/19/2023   RBC 3.61 (L) 05/19/2023   HGB 11.2 (L) 05/19/2023   HCT 33.0 (L) 05/19/2023   MCV 91.4 05/19/2023   MCH 31.0 05/19/2023   PLT 174 05/19/2023   MCHC 33.9 05/19/2023   RDW 13.3 05/19/2023   LYMPHSABS 2.3 05/16/2023   MONOABS 0.8 05/16/2023   EOSABS 0.0 05/16/2023   BASOSABS 0.0 05/16/2023     Last metabolic panel Lab Results  Component Value Date   NA 137 05/18/2023   K 3.7 05/18/2023   CL 111 05/18/2023   CO2 21 (L) 05/18/2023   BUN 23 05/18/2023   CREATININE 0.92 05/18/2023   GLUCOSE 117 (H) 05/18/2023  GFRNONAA >60 05/18/2023   GFRAA >60 10/20/2019   CALCIUM 9.1 05/21/2023   PHOS 3.4 07/06/2022   PROT 7.6 01/23/2023   ALBUMIN 2.6 (L) 05/21/2023   LABGLOB 3.8 12/12/2021   AGRATIO 1.1 (L) 12/12/2021   BILITOT 0.8 01/23/2023   ALKPHOS 65 01/23/2023   AST 36  01/23/2023   ALT 24 01/23/2023   ANIONGAP 5 05/18/2023    GFR: Estimated Creatinine Clearance: 49.8 mL/min (by C-G formula based on SCr of 0.92 mg/dL).  Recent Results (from the past 240 hours)  Surgical pcr screen     Status: Abnormal   Collection Time: 05/17/23  2:34 AM   Specimen: Nasal Mucosa; Nasal Swab  Result Value Ref Range Status   MRSA, PCR POSITIVE (A) NEGATIVE Final    Comment: RESULT CALLED TO, READ BACK BY AND VERIFIED WITH: I AKOETEY,RN@0505  05/17/23 MK    Staphylococcus aureus POSITIVE (A) NEGATIVE Final    Comment: (NOTE) The Xpert SA Assay (FDA approved for NASAL specimens in patients 69 years of age and older), is one component of a comprehensive surveillance program. It is not intended to diagnose infection nor to guide or monitor treatment. Performed at [facility], [address] [place], [place] [address]       Radiology Studies: No results found.     LOS: 5 days    Jacquelin Hawking, MD [facility] 05/21/2023, 1:26 PM   If 7PM-7AM, please contact night-coverage [email or web address]

## 2023-05-21 NOTE — Discharge Summary (Signed)
Physician Discharge Summary   Patient: Lisa Thornton MRN: [id number] DOB: 1956/03/09  Admit date:     05/16/2023  Discharge date: 05/21/23  Discharge Physician: Jacquelin Hawking, MD   PCP: Anabel Halon, MD   Recommendations at discharge:  PCP follow-up Orthopedic surgery follow-up  Discharge Diagnoses: Principal Problem:   Closed left hip fracture [facility]) Active Problems:   Dysuria   Essential hypertension   Dyslipidemia   Type 2 diabetes mellitus with chronic kidney disease, with long-term current use of insulin (HCC)   Breast cancer of upper-outer quadrant of left female breast (HCC)   Malnutrition of moderate degree   Left wrist fracture  Resolved Problems:   * No resolved hospital problems. *  Hospital Course: Lisa Thornton is a 68 y.o. female with a history of paroxysmal atrial fibrillation, breast cancer status post modified radical mastectomy, OSA, primary hypertension.  Patient presented secondary to fall and resultant acute left hip and wrist pain and was found to have evidence of a left hip fracture in addition to a left distal radius fracture.  Orthopedic surgery consulted for surgical management and performed a left hip pinning.  Assessment and Plan:  Left hip fracture Secondary to fall.  Unilateral hip and pelvis x-ray on admission significant for an impacted subcapital left femoral neck fracture.  Orthopedic surgery consulted with recommendations for operative management. Patient underwent left hip pinning on 2/13. Patient is weight bearing as tolerated LLE.    Left wrist fracture Secondary to fall.  Wrist x-ray on admission significant for a possible subtle slightly impacted nondisplaced distal radius fracture.  Orthopedic surgery recommendations for nonoperative management with splint.  Recommendation for nonweightbearing LUE.   Paroxysmal atrial fibrillation Patient is in atrial fibrillation. Patient is managed on Eliquis and Toprol-XL as an outpatient.  Eliquis  held on admission and restarted post-op. Continue Toprol-XL and Eliquis.   Leukocytosis Likely reactive secondary to acute fracture. Resolved.   Dysuria Patient recently treated with Bactrim DS.  Urinalysis ordered and is still pending.   Primary hypertension Patient is on losartan and metoprolol succinate as an outpatient. Continue metoprolol succinate and losartan.   Diabetes mellitus type 2 Well-controlled with hemoglobin A1c of 5.9%. Patient is on Jardiance, metformin, semaglutide as an outpatient.  Resume outpatient regimen on discharge.   Postoperative anemia Perioperative blood loss Hemoglobin of 14.2 g/dL on admission, down to 91.4 g/dL post operatively. Stable. Hemoglobin of 11.2 g/dL.   Hyperlipidemia Continue Lipitor   Moderate malnutrition Dietitian recommendations (2/13): Ensure Enlive po BID, each supplement provides 350 kcal and 20 grams of protein. Provided brief education on importance of protein intake and adequate calorie intake post-op Recommend room service with assist  Documented food preferences   Thrombocytopenia Mild. Likely secondary to blood loss. Resolved.   Consultants:  Orthopedic surgery   Procedures:  2/13: Left hip pinning  Disposition: Skilled nursing facility Diet recommendation: Carb modified diet   DISCHARGE MEDICATION: Allergies as of 05/21/2023   No Known Allergies      Medication List     STOP taking these medications    acetaminophen 500 MG tablet Commonly known as: TYLENOL   CALCIUM PO   sulfamethoxazole-trimethoprim 800-160 MG tablet Commonly known as: BACTRIM DS       TAKE these medications    Accu-Chek Guide test strip Generic drug: glucose blood USE 1 STRIP TO CHECK GLUCOSE THREE TIMES DAILY MORNING,  NOON  AND  AT  BEDTIME  AS  DIRECTED   Accu-Chek Softclix Lancets  lancets USE 1  TO CHECK GLUCOSE UP TO 4 TIMES DAILY   anastrozole 1 MG tablet Commonly known as: ARIMIDEX Take 1 tablet by mouth once  daily   apixaban 5 MG Tabs tablet Commonly known as: Eliquis Take 1 tablet (5 mg total) by mouth 2 (two) times daily.   atorvastatin 10 MG tablet Commonly known as: LIPITOR Take 1 tablet (10 mg total) by mouth at bedtime.   blood glucose meter kit and supplies Dispense based on patient and insurance preference. Use up to four times daily as directed. (FOR ICD-10 E10.9, E11.9).   cetirizine 10 MG tablet Commonly known as: ZYRTEC Take 10 mg by mouth daily.   chlorhexidine 4 % external liquid Commonly known as: HIBICLENS Apply 15 mLs (1 Application total) topically as directed for 30 doses. Use as directed daily for 5 days every other week for 6 weeks.   empagliflozin 10 MG Tabs tablet Commonly known as: JARDIANCE Take 1 tablet (10 mg total) by mouth daily.   feeding supplement Liqd Take 237 mLs by mouth 2 (two) times daily between meals.   furosemide 40 MG tablet Commonly known as: LASIX Take 20 mg by mouth daily as needed (swelling).   losartan 25 MG tablet Commonly known as: COZAAR Take 0.5 tablets (12.5 mg total) by mouth daily.   magnesium oxide 400 (240 Mg) MG tablet Commonly known as: MAG-OX Take 1 tablet (400 mg total) by mouth daily. What changed: when to take this   metFORMIN 1000 MG tablet Commonly known as: GLUCOPHAGE Take 1 tablet (1,000 mg total) by mouth daily with breakfast.   metoprolol succinate 100 MG 24 hr tablet Commonly known as: TOPROL-XL Take 1 tablet (100 mg total) by mouth 2 (two) times daily. Take with or immediately following a meal.   mupirocin ointment 2 % Commonly known as: BACTROBAN Place 1 Application into the nose 2 (two) times daily for 60 doses. Use as directed 2 times daily for 5 days every other week for 6 weeks.   ondansetron 4 MG tablet Commonly known as: ZOFRAN Take 1 tablet (4 mg total) by mouth every 8 (eight) hours as needed for nausea or vomiting.   OVER THE COUNTER MEDICATION Take 1 tablet by mouth at bedtime. OTC  "stomach pill"   oxyCODONE-acetaminophen 5-325 MG tablet Commonly known as: PERCOCET/ROXICET Take 1 tablet by mouth every 6 (six) hours as needed for severe pain (pain score 7-10).   Rybelsus 7 MG Tabs Generic drug: Semaglutide Take 1 tablet (7 mg total) by mouth daily.   VITAMIN D-3 PO Take 1 tablet by mouth daily.        Contact information for follow-up providers     Sheral Apley, MD. Schedule an appointment as soon as possible for a visit in 2 week(s).   Specialty: Orthopedic Surgery Why: You need an office appointment 10-14 days after surgery Contact information: [address] [place] [address] [phone]              Contact information for after-discharge care     Destination     [facility] .   Service: Skilled Nursing Contact information: [address] [place] [address] [phone]                    Discharge Exam: BP 121/63   Pulse (!) 101   Temp 98.1 F (36.7 C)   Resp 17   Ht 5\' 4"  (1.626 m)  Wt 64 kg   SpO2 97%   BMI 24.20 kg/m   General exam: Appears calm and comfortable Respiratory system: Respiratory effort normal.   Condition at discharge: stable  The results of significant diagnostics from this hospitalization (including imaging, microbiology, ancillary and laboratory) are listed below for reference.   Imaging Studies: DG HIP UNILAT WITH PELVIS 2-3 VIEWS LEFT Result Date: 05/17/2023 CLINICAL DATA:  Elective surgery, pinning of hip fracture. EXAM: DG HIP (WITH OR WITHOUT PELVIS) 2-3V LEFT COMPARISON:  Preoperative imaging. FINDINGS: Two fluoroscopic spot views of the left hip submitted from the operating room. Three screws traverse left femoral neck fracture. Fluoroscopy time 51.9 seconds. Dose 8.1 mGy. IMPRESSION: Intraoperative fluoroscopy during left femoral neck fracture fixation. Electronically Signed   By: Narda Rutherford M.D.   On:  05/17/2023 15:36   DG C-Arm 1-60 Min-No Report Result Date: 05/17/2023 Fluoroscopy was utilized by the requesting physician.  No radiographic interpretation.   DG Wrist Complete Left Result Date: 05/16/2023 CLINICAL DATA:  Fall onto left side EXAM: LEFT WRIST - COMPLETE 3+ VIEW COMPARISON:  None Available. FINDINGS: Possible subtle slightly impacted nondisplaced distal radius fracture. No subluxation. Positive for wrist soft tissue swelling. Mild degenerative change at the first MTP joint. IMPRESSION: Possible subtle slightly impacted nondisplaced distal radius fracture. Electronically Signed   By: Jasmine Pang M.D.   On: 05/16/2023 21:52   DG Hip Unilat With Pelvis 2-3 Views Left Result Date: 05/16/2023 CLINICAL DATA:  Larey Seat onto left side EXAM: DG HIP (WITH OR WITHOUT PELVIS) 2-3V LEFT COMPARISON:  None Available. FINDINGS: Frontal view of the pelvis as well as frontal and cross-table lateral views of the left hip are obtained. There is an impacted subcapital left femoral neck fracture, with near anatomic alignment. No dislocation. Right hip is unremarkable. The remainder of the bony pelvis is normal. IMPRESSION: 1. Impacted subcapital left femoral neck fracture. Electronically Signed   By: Sharlet Salina M.D.   On: 05/16/2023 21:51   DG Chest Port 1 View Result Date: 05/16/2023 CLINICAL DATA:  Fall onto left side EXAM: PORTABLE CHEST 1 VIEW COMPARISON:  11/13/2022. FINDINGS: Right-sided central venous port tip at the upper SVC. Right lung grossly clear. Stable cardiomediastinal silhouette. Streaky atelectasis or scarring at the left base. Clips in the left axilla. Old right-sided rib fractures screw IMPRESSION: Streaky atelectasis or scar at left base. Electronically Signed   By: Jasmine Pang M.D.   On: 05/16/2023 21:51    Microbiology: Results for orders placed or performed during the hospital encounter of 05/16/23  Surgical pcr screen     Status: Abnormal   Collection Time: 05/17/23  2:34 AM    Specimen: Nasal Mucosa; Nasal Swab  Result Value Ref Range Status   MRSA, PCR POSITIVE (A) NEGATIVE Final    Comment: RESULT CALLED TO, READ BACK BY AND VERIFIED WITH: I AKOETEY,RN@[id number]  05/17/23 MK    Staphylococcus aureus POSITIVE (A) NEGATIVE Final    Comment: (NOTE) The Xpert SA Assay (FDA approved for NASAL specimens in patients 53 years of age and older), is one component of a comprehensive surveillance program. It is not intended to diagnose infection nor to guide or monitor treatment. Performed at [facility], [address] [place], [place] [address]     Labs: CBC: Recent Labs  Lab 05/16/23 2138 05/18/23 0620 05/19/23 0536  WBC 16.1* 9.4 9.8  NEUTROABS 12.9*  --   --   HGB 14.2 10.2* 11.2*  HCT 40.4 30.2* 33.0*  MCV  88.8 91.8 91.4  PLT 227 146* 174   Basic Metabolic Panel: Recent Labs  Lab 05/16/23 2138 05/18/23 0620 05/21/23 0257  NA 136 137  --   K 4.2 3.7  --   CL 100 111  --   CO2 22 21*  --   GLUCOSE 177* 117*  --   BUN 23 23  --   CREATININE 1.19* 0.92  --   CALCIUM 10.3 7.2* 9.1   Liver Function Tests: Recent Labs  Lab 05/18/23 1624 05/21/23 0257  ALBUMIN 2.8* 2.6*   CBG: Recent Labs  Lab 05/20/23 1631 05/20/23 1959 05/20/23 2346 05/21/23 0531 05/21/23 0628  GLUCAP 170* 217* 196* 185* 189*    Discharge time spent: 35 minutes.  Signed: Jacquelin Hawking, MD [facility] 05/21/2023

## 2023-05-21 NOTE — TOC Progression Note (Addendum)
Transition of Care [facility]) - Progression Note    Patient Details  Name: Lisa Thornton MRN: [id number] Date of Birth: 10/04/1955  Transition of [facility] CM/SW Contact  Lorri Frederick, LCSW Phone Number: 05/21/2023, 9:50 AM  Clinical Narrative:   Bed offers provided to niece [name] over the phone.  She will discuss with pt and call back.   1040: TC [name].  They want to accept offer at [facility] rehab--CSW confirmed with [place] that they can receive today.  New PT note needed for insurance auth, PT aware.    1340: SNF auth request submitted in [employer] and approved: [id number], 3 days: 2/17-2/19. MD informed.   Expected Discharge Plan: Skilled Nursing Facility Barriers to Discharge: Continued Medical Work up  Expected Discharge Plan and Services   Discharge Planning Services: CM Consult   Living arrangements for the past 2 months: Single Family Home                                       Social Determinants of Health (SDOH) Interventions SDOH Screenings   Food Insecurity: No Food Insecurity (05/17/2023)  Housing: Low Risk  (05/17/2023)  Transportation Needs: No Transportation Needs (05/17/2023)  Utilities: Not At Risk (05/17/2023)  Alcohol Screen: Low Risk  (03/25/2020)  Depression (PHQ2-9): Low Risk  (01/22/2023)  Financial Resource Strain: Low Risk  (03/29/2021)  Physical Activity: Insufficiently Active (03/29/2021)  Social Connections: Socially Isolated (05/17/2023)  Stress: No Stress Concern Present (03/29/2021)  Tobacco Use: Low Risk  (04/28/2023)    Readmission Risk Interventions     No data to display

## 2023-05-21 NOTE — Progress Notes (Signed)
Patient discharging to sniff [facility], patient being discharged via ptar, discharge packet given to ptar, and report given to nurse coretta at the sniff, IV access removed and port de accessed.

## 2023-05-22 DIAGNOSIS — I10 Essential (primary) hypertension: Secondary | ICD-10-CM | POA: Diagnosis not present

## 2023-05-22 DIAGNOSIS — W19XXXS Unspecified fall, sequela: Secondary | ICD-10-CM | POA: Diagnosis not present

## 2023-05-22 DIAGNOSIS — G4733 Obstructive sleep apnea (adult) (pediatric): Secondary | ICD-10-CM | POA: Diagnosis not present

## 2023-05-22 DIAGNOSIS — E1143 Type 2 diabetes mellitus with diabetic autonomic (poly)neuropathy: Secondary | ICD-10-CM | POA: Diagnosis not present

## 2023-05-22 DIAGNOSIS — E1122 Type 2 diabetes mellitus with diabetic chronic kidney disease: Secondary | ICD-10-CM | POA: Diagnosis not present

## 2023-05-22 DIAGNOSIS — S52502D Unspecified fracture of the lower end of left radius, subsequent encounter for closed fracture with routine healing: Secondary | ICD-10-CM | POA: Diagnosis not present

## 2023-05-22 DIAGNOSIS — E440 Moderate protein-calorie malnutrition: Secondary | ICD-10-CM | POA: Diagnosis not present

## 2023-05-22 DIAGNOSIS — Z794 Long term (current) use of insulin: Secondary | ICD-10-CM | POA: Diagnosis not present

## 2023-05-22 DIAGNOSIS — D509 Iron deficiency anemia, unspecified: Secondary | ICD-10-CM | POA: Diagnosis not present

## 2023-05-22 DIAGNOSIS — C50912 Malignant neoplasm of unspecified site of left female breast: Secondary | ICD-10-CM | POA: Diagnosis not present

## 2023-05-22 DIAGNOSIS — E785 Hyperlipidemia, unspecified: Secondary | ICD-10-CM | POA: Diagnosis not present

## 2023-05-22 DIAGNOSIS — S72002D Fracture of unspecified part of neck of left femur, subsequent encounter for closed fracture with routine healing: Secondary | ICD-10-CM | POA: Diagnosis not present

## 2023-05-29 ENCOUNTER — Ambulatory Visit: Payer: 59 | Admitting: Internal Medicine

## 2023-05-30 DIAGNOSIS — I509 Heart failure, unspecified: Secondary | ICD-10-CM | POA: Diagnosis not present

## 2023-05-30 DIAGNOSIS — E1122 Type 2 diabetes mellitus with diabetic chronic kidney disease: Secondary | ICD-10-CM | POA: Diagnosis not present

## 2023-05-30 DIAGNOSIS — L89616 Pressure-induced deep tissue damage of right heel: Secondary | ICD-10-CM | POA: Diagnosis not present

## 2023-05-30 DIAGNOSIS — D649 Anemia, unspecified: Secondary | ICD-10-CM | POA: Diagnosis not present

## 2023-05-30 DIAGNOSIS — G4733 Obstructive sleep apnea (adult) (pediatric): Secondary | ICD-10-CM | POA: Diagnosis not present

## 2023-06-06 DIAGNOSIS — E1122 Type 2 diabetes mellitus with diabetic chronic kidney disease: Secondary | ICD-10-CM | POA: Diagnosis not present

## 2023-06-06 DIAGNOSIS — I509 Heart failure, unspecified: Secondary | ICD-10-CM | POA: Diagnosis not present

## 2023-06-06 DIAGNOSIS — D649 Anemia, unspecified: Secondary | ICD-10-CM | POA: Diagnosis not present

## 2023-06-06 DIAGNOSIS — L89616 Pressure-induced deep tissue damage of right heel: Secondary | ICD-10-CM | POA: Diagnosis not present

## 2023-06-13 DIAGNOSIS — I509 Heart failure, unspecified: Secondary | ICD-10-CM | POA: Diagnosis not present

## 2023-06-13 DIAGNOSIS — L89616 Pressure-induced deep tissue damage of right heel: Secondary | ICD-10-CM | POA: Diagnosis not present

## 2023-06-13 DIAGNOSIS — E1122 Type 2 diabetes mellitus with diabetic chronic kidney disease: Secondary | ICD-10-CM | POA: Diagnosis not present

## 2023-06-13 DIAGNOSIS — D649 Anemia, unspecified: Secondary | ICD-10-CM | POA: Diagnosis not present

## 2023-06-20 DIAGNOSIS — S72002D Fracture of unspecified part of neck of left femur, subsequent encounter for closed fracture with routine healing: Secondary | ICD-10-CM | POA: Diagnosis not present

## 2023-06-20 DIAGNOSIS — C50912 Malignant neoplasm of unspecified site of left female breast: Secondary | ICD-10-CM | POA: Diagnosis not present

## 2023-06-20 DIAGNOSIS — E785 Hyperlipidemia, unspecified: Secondary | ICD-10-CM | POA: Diagnosis not present

## 2023-06-20 DIAGNOSIS — I5032 Chronic diastolic (congestive) heart failure: Secondary | ICD-10-CM | POA: Diagnosis not present

## 2023-06-20 DIAGNOSIS — I429 Cardiomyopathy, unspecified: Secondary | ICD-10-CM | POA: Diagnosis not present

## 2023-06-20 DIAGNOSIS — D649 Anemia, unspecified: Secondary | ICD-10-CM | POA: Diagnosis not present

## 2023-06-20 DIAGNOSIS — E1143 Type 2 diabetes mellitus with diabetic autonomic (poly)neuropathy: Secondary | ICD-10-CM | POA: Diagnosis not present

## 2023-06-20 DIAGNOSIS — I509 Heart failure, unspecified: Secondary | ICD-10-CM | POA: Diagnosis not present

## 2023-06-20 DIAGNOSIS — I4891 Unspecified atrial fibrillation: Secondary | ICD-10-CM | POA: Diagnosis not present

## 2023-06-20 DIAGNOSIS — E1122 Type 2 diabetes mellitus with diabetic chronic kidney disease: Secondary | ICD-10-CM | POA: Diagnosis not present

## 2023-06-20 DIAGNOSIS — G4733 Obstructive sleep apnea (adult) (pediatric): Secondary | ICD-10-CM | POA: Diagnosis not present

## 2023-06-20 DIAGNOSIS — S52502D Unspecified fracture of the lower end of left radius, subsequent encounter for closed fracture with routine healing: Secondary | ICD-10-CM | POA: Diagnosis not present

## 2023-06-20 DIAGNOSIS — I10 Essential (primary) hypertension: Secondary | ICD-10-CM | POA: Diagnosis not present

## 2023-06-20 DIAGNOSIS — D509 Iron deficiency anemia, unspecified: Secondary | ICD-10-CM | POA: Diagnosis not present

## 2023-06-20 DIAGNOSIS — L89616 Pressure-induced deep tissue damage of right heel: Secondary | ICD-10-CM | POA: Diagnosis not present

## 2023-06-27 DIAGNOSIS — G4733 Obstructive sleep apnea (adult) (pediatric): Secondary | ICD-10-CM | POA: Diagnosis not present

## 2023-06-27 DIAGNOSIS — D649 Anemia, unspecified: Secondary | ICD-10-CM | POA: Diagnosis not present

## 2023-06-27 DIAGNOSIS — E1122 Type 2 diabetes mellitus with diabetic chronic kidney disease: Secondary | ICD-10-CM | POA: Diagnosis not present

## 2023-06-27 DIAGNOSIS — L89610 Pressure ulcer of right heel, unstageable: Secondary | ICD-10-CM | POA: Diagnosis not present

## 2023-06-27 DIAGNOSIS — I509 Heart failure, unspecified: Secondary | ICD-10-CM | POA: Diagnosis not present

## 2023-06-28 ENCOUNTER — Other Ambulatory Visit (HOSPITAL_COMMUNITY): Payer: 59

## 2023-06-28 ENCOUNTER — Ambulatory Visit (HOSPITAL_COMMUNITY): Payer: 59

## 2023-06-30 DIAGNOSIS — S52502D Unspecified fracture of the lower end of left radius, subsequent encounter for closed fracture with routine healing: Secondary | ICD-10-CM | POA: Diagnosis not present

## 2023-06-30 DIAGNOSIS — E1122 Type 2 diabetes mellitus with diabetic chronic kidney disease: Secondary | ICD-10-CM | POA: Diagnosis not present

## 2023-06-30 DIAGNOSIS — Z4789 Encounter for other orthopedic aftercare: Secondary | ICD-10-CM | POA: Diagnosis not present

## 2023-06-30 DIAGNOSIS — Z794 Long term (current) use of insulin: Secondary | ICD-10-CM | POA: Diagnosis not present

## 2023-06-30 DIAGNOSIS — S72002D Fracture of unspecified part of neck of left femur, subsequent encounter for closed fracture with routine healing: Secondary | ICD-10-CM | POA: Diagnosis not present

## 2023-07-02 ENCOUNTER — Telehealth: Payer: Self-pay

## 2023-07-02 DIAGNOSIS — S52502D Unspecified fracture of the lower end of left radius, subsequent encounter for closed fracture with routine healing: Secondary | ICD-10-CM | POA: Diagnosis not present

## 2023-07-02 DIAGNOSIS — S72042A Displaced fracture of base of neck of left femur, initial encounter for closed fracture: Secondary | ICD-10-CM | POA: Diagnosis not present

## 2023-07-02 NOTE — Transitions of Care (Post Inpatient/ED Visit) (Signed)
 07/02/2023  Name: Lisa Thornton MRN: [id number] DOB: January 02, 1956  Today's TOC FU Call Status: Today's TOC FU Call Status:: Successful TOC FU Call Completed TOC FU Call Complete Date: 07/02/23 Patient's Name and Date of Birth confirmed.  Transition Care Management Follow-up Telephone Call Date of Discharge: 06/30/23 Discharge Facility: Other [facility]) Name of Other (Non[facility]) Discharge Facility: [facility] Type of Discharge: Inpatient Admission Primary Inpatient Discharge Diagnosis:: thrombocytopenia How have you been since you were released from the hospital?: Better Any questions or concerns?: No  Items Reviewed: Did you receive and understand the discharge instructions provided?: Yes Medications obtained,verified, and reconciled?: Yes (Medications Reviewed) Any new allergies since your discharge?: No Dietary orders reviewed?: Yes Do you have support at home?: Yes People in Home: other relative(s)  Medications Reviewed Today: Medications Reviewed Today     Reviewed by Karena Addison, LPN (Licensed Practical Nurse) on 07/02/23 at 1238  Med List Status: <None>   Medication Order Taking? Sig Documenting Provider Last Dose Status Informant  Accu-Chek Softclix Lancets lancets [id number] No USE 1  TO CHECK GLUCOSE UP TO 4 TIMES DAILY Heather Roberts, NP Taking Active Pharmacy Records, Self  anastrozole (ARIMIDEX) 1 MG tablet [id number] No Take 1 tablet by mouth once daily Anabel Halon, MD Past Week Active Self, Pharmacy Records  apixaban (ELIQUIS) 5 MG TABS tablet [id number] No Take 1 tablet (5 mg total) by mouth 2 (two) times daily. Anabel Halon, MD 05/15/2023 Active Pharmacy Records, Self           Med Note (COFFELL, Marzella Schlein   Thu May 17, 2023 11:23 AM) Per dispense report, LF 01/18/2023 #180, 90 DS. Pt insists compliance.  atorvastatin (LIPITOR) 10 MG tablet [id number] No Take 1 tablet (10 mg total) by mouth at bedtime. Anabel Halon, MD Past Week Active Self,  Pharmacy Records           Med Note (COFFELL, Marzella Schlein   Thu May 17, 2023 11:24 AM) Per dispense report, LF 12/07/2022 #90, 90 DS. Pt insists compliance.  blood glucose meter kit and supplies [id number] No Dispense based on patient and insurance preference. Use up to four times daily as directed. (FOR ICD-10 E10.9, E11.9). Freddy Finner, NP Taking Active Pharmacy Records, Self  cetirizine (ZYRTEC) 10 MG tablet [id number] No Take 10 mg by mouth daily. [provider] Past Week Active Self, Pharmacy Records  chlorhexidine (HIBICLENS) 4 % external liquid [id number]  Apply 15 mLs (1 Application total) topically as directed for 30 doses. Use as directed daily for 5 days every other week for 6 weeks. Levester Fresh M, PA-C  Active   Cholecalciferol (VITAMIN D-3 PO) [id number] No Take 1 tablet by mouth daily. [provider] Past Week Active Self, Pharmacy Records  empagliflozin (JARDIANCE) 10 MG TABS tablet [id number] No Take 1 tablet (10 mg total) by mouth daily. [place] Ouch Lennart Pall, PA-C Past Week Active Self, Pharmacy Records  feeding supplement (ENSURE ENLIVE / ENSURE PLUS) LIQD [id number]  Take 237 mLs by mouth 2 (two) times daily between meals. Narda Bonds, MD  Active   furosemide (LASIX) 40 MG tablet [id number]  Take 20 mg by mouth daily as needed (swelling). [provider]  Active Self, Pharmacy Records           Med Note (COFFELL, Marzella Schlein   Thu May 17, 2023 11:31 AM) Has available at home, rarely used. Unknown last dose. Verified with pt and pharmacy - pt only has 40mg   tablets on hand - pharmacy received Rx for 20mg  tablets on 11/28/22, which was never sold.  glucose blood (ACCU-CHEK GUIDE) test strip [id number] No USE 1 STRIP TO CHECK GLUCOSE THREE TIMES DAILY MORNING,  NOON  AND  AT  BEDTIME  AS  DIRECTED Anabel Halon, MD Taking Active Self, Pharmacy Records  losartan (COZAAR) 25 MG tablet [id number] No Take 0.5 tablets (12.5 mg total) by mouth daily. Ellsworth Lennox,  PA-C Past Week Active Self, Pharmacy Records           Med Note (COFFELL, Marzella Schlein   Thu May 17, 2023 11:36 AM) Per pt, she ran out of medication on 05/14/23, but stated that she has a unmarked bottle of medication that "looks like losartan" but she is unsure what it actually is. Pt currently out of medication.  magnesium oxide (MAG-OX) 400 (240 Mg) MG tablet [id number] No Take 1 tablet (400 mg total) by mouth daily.  Patient taking differently: Take 400 mg by mouth at bedtime.   Catarina Hartshorn, MD Past Week Active Self, Pharmacy Records  metFORMIN (GLUCOPHAGE) 1000 MG tablet [id number] No Take 1 tablet (1,000 mg total) by mouth daily with breakfast. Anabel Halon, MD Past Week Active Self, Pharmacy Records           Med Note (COFFELL, Marzella Schlein   Thu May 17, 2023 11:32 AM) Per dispense report, LF 12/03/2022 #90, 90 DS. Pt insists compliance.  metoprolol succinate (TOPROL-XL) 100 MG 24 hr tablet [id number] No Take 1 tablet (100 mg total) by mouth 2 (two) times daily. Take with or immediately following a meal. Strader, Lennart Pall, PA-C Past Week Expired 05/17/23 2359 Self, Pharmacy Records  ondansetron (ZOFRAN) 4 MG tablet [id number] No Take 1 tablet (4 mg total) by mouth every 8 (eight) hours as needed for nausea or vomiting. Anabel Halon, MD Past Week Active Self, Pharmacy Records  OVER THE COUNTER MEDICATION [id number] No Take 1 tablet by mouth at bedtime. OTC "stomach pill" [provider] Past Week Active Self, Pharmacy Records  oxyCODONE-acetaminophen (PERCOCET/ROXICET) 5-325 MG tablet [id number]  Take 1 tablet by mouth every 6 (six) hours as needed for severe pain (pain score 7-10). Levester Fresh M, PA-C  Active   Semaglutide (RYBELSUS) 7 MG TABS [id number] No Take 1 tablet (7 mg total) by mouth daily. Anabel Halon, MD 05/16/2023 Active Self, Pharmacy Records            Home Care and Equipment/Supplies: Were Home Health Services Ordered?: Yes Name of Home Health Agency:: [facility] Has  Agency set up a time to come to your home?: No Any new equipment or medical supplies ordered?: Yes Name of Medical supply agency?: unknown Were you able to get the equipment/medical supplies?: Yes Do you have any questions related to the use of the equipment/supplies?: No  Functional Questionnaire: Do you need assistance with bathing/showering or dressing?: Yes Do you need assistance with meal preparation?: Yes Do you need assistance with eating?: No Do you have difficulty maintaining continence: Yes Do you need assistance with getting out of bed/getting out of a chair/moving?: Yes Do you have difficulty managing or taking your medications?: Yes  Follow up appointments reviewed: PCP Follow-up appointment confirmed?: Yes MD Provider Line Number:[phone] Given: No Date of PCP follow-up appointment?: 07/05/23 Follow-up Provider: [facility] Follow-up appointment confirmed?: Yes Date of Specialist follow-up appointment?: 07/02/23 Follow-Up Specialty Provider:: orhto Do you need transportation to your follow-up appointment?: No Do you understand care options if  your condition(s) worsen?: Yes-patient verbalized understanding    SIGNATURE Karena Addison, LPN [facility] Nurse Health Advisor Direct Dial [phone]

## 2023-07-03 DIAGNOSIS — D509 Iron deficiency anemia, unspecified: Secondary | ICD-10-CM | POA: Diagnosis not present

## 2023-07-03 DIAGNOSIS — I429 Cardiomyopathy, unspecified: Secondary | ICD-10-CM | POA: Diagnosis not present

## 2023-07-03 DIAGNOSIS — G4733 Obstructive sleep apnea (adult) (pediatric): Secondary | ICD-10-CM | POA: Diagnosis not present

## 2023-07-03 DIAGNOSIS — S52502D Unspecified fracture of the lower end of left radius, subsequent encounter for closed fracture with routine healing: Secondary | ICD-10-CM | POA: Diagnosis not present

## 2023-07-03 DIAGNOSIS — D631 Anemia in chronic kidney disease: Secondary | ICD-10-CM | POA: Diagnosis not present

## 2023-07-03 DIAGNOSIS — Z7984 Long term (current) use of oral hypoglycemic drugs: Secondary | ICD-10-CM | POA: Diagnosis not present

## 2023-07-03 DIAGNOSIS — E440 Moderate protein-calorie malnutrition: Secondary | ICD-10-CM | POA: Diagnosis not present

## 2023-07-03 DIAGNOSIS — E785 Hyperlipidemia, unspecified: Secondary | ICD-10-CM | POA: Diagnosis not present

## 2023-07-03 DIAGNOSIS — S72002D Fracture of unspecified part of neck of left femur, subsequent encounter for closed fracture with routine healing: Secondary | ICD-10-CM | POA: Diagnosis not present

## 2023-07-03 DIAGNOSIS — K3184 Gastroparesis: Secondary | ICD-10-CM | POA: Diagnosis not present

## 2023-07-03 DIAGNOSIS — Z7901 Long term (current) use of anticoagulants: Secondary | ICD-10-CM | POA: Diagnosis not present

## 2023-07-03 DIAGNOSIS — E1122 Type 2 diabetes mellitus with diabetic chronic kidney disease: Secondary | ICD-10-CM | POA: Diagnosis not present

## 2023-07-03 DIAGNOSIS — I480 Paroxysmal atrial fibrillation: Secondary | ICD-10-CM | POA: Diagnosis not present

## 2023-07-03 DIAGNOSIS — N189 Chronic kidney disease, unspecified: Secondary | ICD-10-CM | POA: Diagnosis not present

## 2023-07-03 DIAGNOSIS — E1143 Type 2 diabetes mellitus with diabetic autonomic (poly)neuropathy: Secondary | ICD-10-CM | POA: Diagnosis not present

## 2023-07-03 DIAGNOSIS — I130 Hypertensive heart and chronic kidney disease with heart failure and stage 1 through stage 4 chronic kidney disease, or unspecified chronic kidney disease: Secondary | ICD-10-CM | POA: Diagnosis not present

## 2023-07-03 DIAGNOSIS — Z9181 History of falling: Secondary | ICD-10-CM | POA: Diagnosis not present

## 2023-07-03 DIAGNOSIS — C50912 Malignant neoplasm of unspecified site of left female breast: Secondary | ICD-10-CM | POA: Diagnosis not present

## 2023-07-03 DIAGNOSIS — E1140 Type 2 diabetes mellitus with diabetic neuropathy, unspecified: Secondary | ICD-10-CM | POA: Diagnosis not present

## 2023-07-03 DIAGNOSIS — I5032 Chronic diastolic (congestive) heart failure: Secondary | ICD-10-CM | POA: Diagnosis not present

## 2023-07-05 ENCOUNTER — Encounter: Payer: Self-pay | Admitting: Internal Medicine

## 2023-07-05 ENCOUNTER — Telehealth: Payer: Self-pay | Admitting: Radiology

## 2023-07-05 ENCOUNTER — Ambulatory Visit: Admitting: Internal Medicine

## 2023-07-05 VITALS — BP 101/65 | HR 92 | Ht 64.0 in | Wt 128.0 lb

## 2023-07-05 DIAGNOSIS — Z09 Encounter for follow-up examination after completed treatment for conditions other than malignant neoplasm: Secondary | ICD-10-CM

## 2023-07-05 DIAGNOSIS — S62102D Fracture of unspecified carpal bone, left wrist, subsequent encounter for fracture with routine healing: Secondary | ICD-10-CM

## 2023-07-05 DIAGNOSIS — L97412 Non-pressure chronic ulcer of right heel and midfoot with fat layer exposed: Secondary | ICD-10-CM

## 2023-07-05 DIAGNOSIS — S72002D Fracture of unspecified part of neck of left femur, subsequent encounter for closed fracture with routine healing: Secondary | ICD-10-CM | POA: Diagnosis not present

## 2023-07-05 DIAGNOSIS — R5381 Other malaise: Secondary | ICD-10-CM | POA: Diagnosis not present

## 2023-07-05 DIAGNOSIS — E1140 Type 2 diabetes mellitus with diabetic neuropathy, unspecified: Secondary | ICD-10-CM | POA: Diagnosis not present

## 2023-07-05 DIAGNOSIS — Z7984 Long term (current) use of oral hypoglycemic drugs: Secondary | ICD-10-CM | POA: Diagnosis not present

## 2023-07-05 NOTE — Telephone Encounter (Signed)
 Voicemail on triage phone from Greta Doom requesting appointment for patient as soon as possible for heel wound. Patient is diabetic. Per [place], ok to work in with Dr. Lajoyce Corners at 3:45 on Monday 07/09/2023. It is a work in appt so patient will likely have to wait. I called to advise, no answer. LVM requesting return call.   Number left for [name]

## 2023-07-05 NOTE — Patient Instructions (Addendum)
 Please schedule medicare annual wellness visit.  You are being referred to Wound care clinic in [place]. [address] [place] [address] [place], [place], [place] [address] Phone: [phone]  Please take Metformin 500 mg (half tablet of 1000 mg) for now.  Please continue taking other medications as prescribed.

## 2023-07-05 NOTE — Progress Notes (Addendum)
 Established Patient Office Visit  Subjective:  Patient ID: Lisa Thornton, female    DOB: Aug 14, 1955  Age: 68 y.o. MRN: [id number]  CC:  Chief Complaint  Patient presents with   Follow-up    Follow up from was d/c on 06/30/23 from [facility], reports doing well, has a bed sore on her right heel.     HPI Lisa Thornton is a 68 y.o. female with past medical history of HTN, A. Fib., type 2 DM and breast ca s/p mastectomy and chemotherapy who presents for follow-up after recent hospitalization from 05/16/23-05/21/23 and then from [facility] till 06/30/23.  She presented secondary to fall and resultant acute left hip and wrist pain and was found to have evidence of a left hip fracture in addition to a left distal radius fracture.  Orthopedic surgery consulted for surgical management and performed a left hip pinning on 05/17/23. Patient is weight bearing as tolerated LLE. Orthopedic surgery recommendations for left wrist fracture was nonoperative management with splint. Recommendation for nonweightbearing LUE. She was discharged to [facility] rehab, where she did PT till 06/30/23. She has home PT now.  She had pressure ulcers of right heel and sacral area in rehab facility. She has soft boot in place for right heel ulcer, which has necrosed surface. She was supposed to be referred to wound clinic, but has not heard from them yet. Denies any fever, chills.  Her appetite has improved now compared to prior. She denies any episode of hypoglycemia recently. Her HbA1c was 5.9 in 02/25 during hospitalization.  Past Medical History:  Diagnosis Date   Anemia 12/31/2017   Cancer (HCC)    left breast cancer   Chest pain    Diabetes mellitus without complication (HCC)    Hypertension     Past Surgical History:  Procedure Laterality Date   COLONOSCOPY WITH PROPOFOL  N/A 09/27/2021   Procedure: COLONOSCOPY WITH PROPOFOL ;  Surgeon: Urban Garden, MD;  Location: [facility];  Service:  Gastroenterology;  Laterality: N/A;  945   MASTECTOMY MODIFIED RADICAL Left 12/31/2017   Procedure: LEFT MODIFIED RADICAL MASTECTOMY;  Surgeon: Alanda Allegra, MD;  Location: [facility] OR[facility];  Service: General;  Laterality: Left;   PERCUTANEOUS PINNING Left 05/17/2023   Procedure: LEFT HIP PINNING;  Surgeon: Saundra Curl, MD;  Location: [facility] OR;  Service: Orthopedics;  Laterality: Left;   POLYPECTOMY  09/27/2021   Procedure: POLYPECTOMY;  Surgeon: Urban Garden, MD;  Location: [facility];  Service: Gastroenterology;;   PORTACATH PLACEMENT Right 06/27/2017   Procedure: INSERTION PORT-A-CATH;  Surgeon: Alanda Allegra, MD;  Location: [facility];  Service: General;  Laterality: Right;    Family History  Problem Relation Age of Onset   Breast cancer Mother    Thyroid  disease Mother    Heart disease Mother    Heart attack Father    Heart attack Sister    Hypertension Sister    Heart attack Brother    Cancer Sister    Stroke Brother    Alzheimer's disease Maternal Aunt     Social History   Socioeconomic History   Marital status: Divorced    Spouse name: Not on file   Number of children: 2   Years of education: 10   Highest education level: 10th grade  Occupational History   Occupation: Psychologist, 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  Tobacco Use   Smoking status: Never   Smokeless tobacco: Never  Vaping Use   Vaping status: Never Used  Substance and Sexual Activity  Alcohol use: No   Drug use: No   Sexual activity: Not Currently  Other Topics Concern   Not on file  Social History Narrative   Lives with sister   2 children   Dog: Cozie      Enjoys: puzzles, sewing, and walk      Diet: eats all food groups outside: leafy greens    Caffeine: limited, sweet tea   Water : 6-8 cups daily       No car; does have license, wears seat belt   Smoke detector at home   [place] area    Social Drivers of Health   Financial Resource Strain: Low Risk  (03/29/2021)   Overall Financial Resource Strain (CARDIA)     Difficulty of Paying Living Expenses: Not hard at all  Food Insecurity: No Food Insecurity (05/17/2023)   Hunger Vital Sign    Worried About Running Out of Food in the Last Year: Never true    Ran Out of Food in the Last Year: Never true  Transportation Needs: No Transportation Needs (05/17/2023)   PRAPARE - Administrator, Civil Service (Medical): No    Lack of Transportation (Non-Medical): No  Physical Activity: Insufficiently Active (03/29/2021)   Exercise Vital Sign    Days of Exercise per Week: 7 days    Minutes of Exercise per Session: 20 min  Stress: No Stress Concern Present (03/29/2021)   [employer] of Occupational Health - Occupational Stress Questionnaire    Feeling of Stress : Not at all  Social Connections: Socially Isolated (05/17/2023)   Social Connection and Isolation Panel [NHANES]    Frequency of Communication with Friends and Family: More than three times a week    Frequency of Social Gatherings with Friends and Family: Twice a week    Attends Religious Services: Never    Database administrator or Organizations: No    Attends Banker Meetings: Never    Marital Status: Divorced  Catering manager Violence: Not At Risk (05/17/2023)   Humiliation, Afraid, Rape, and Kick questionnaire    Fear of Current or Ex-Partner: No    Emotionally Abused: No    Physically Abused: No    Sexually Abused: No    Outpatient Medications Prior to Visit  Medication Sig Dispense Refill   Accu-Chek Softclix Lancets lancets USE 1  TO CHECK GLUCOSE UP TO 4 TIMES DAILY 100 each 0   anastrozole  (ARIMIDEX ) 1 MG tablet Take 1 tablet by mouth once daily 90 tablet 0   apixaban  (ELIQUIS ) 5 MG TABS tablet Take 1 tablet (5 mg total) by mouth 2 (two) times daily. 180 tablet 3   atorvastatin  (LIPITOR) 10 MG tablet Take 1 tablet (10 mg total) by mouth at bedtime. 90 tablet 0   blood glucose meter kit and supplies Dispense based on patient and insurance preference. Use  up to four times daily as directed. (FOR ICD-10 E10.9, E11.9). 1 each 0   cetirizine (ZYRTEC) 10 MG tablet Take 10 mg by mouth daily.     chlorhexidine  (HIBICLENS ) 4 % external liquid Apply 15 mLs (1 Application total) topically as directed for 30 doses. Use as directed daily for 5 days every other week for 6 weeks. (Patient not taking: Reported on 07/26/2023) 946 mL 1   Cholecalciferol  (VITAMIN D -3 PO) Take 1 tablet by mouth daily.     empagliflozin  (JARDIANCE ) 10 MG TABS tablet Take 1 tablet (10 mg total) by mouth daily. 90 tablet 3   feeding  supplement (ENSURE ENLIVE / ENSURE PLUS) LIQD Take 237 mLs by mouth 2 (two) times daily between meals.     furosemide  (LASIX ) 40 MG tablet Take 20 mg by mouth daily as needed (swelling). (Patient not taking: Reported on 07/26/2023)     glucose blood (ACCU-CHEK GUIDE) test strip USE 1 STRIP TO CHECK GLUCOSE THREE TIMES DAILY MORNING,  NOON  AND  AT  BEDTIME  AS  DIRECTED 100 each 0   losartan  (COZAAR ) 25 MG tablet Take 0.5 tablets (12.5 mg total) by mouth daily. 45 tablet 3   magnesium  oxide (MAG-OX) 400 (240 Mg) MG tablet Take 1 tablet (400 mg total) by mouth daily. (Patient taking differently: Take 400 mg by mouth at bedtime.)     metFORMIN  (GLUCOPHAGE ) 1000 MG tablet Take 1 tablet (1,000 mg total) by mouth daily with breakfast. 90 tablet 1   metoprolol  succinate (TOPROL -XL) 100 MG 24 hr tablet Take 1 tablet (100 mg total) by mouth 2 (two) times daily. Take with or immediately following a meal. 180 tablet 3   ondansetron  (ZOFRAN ) 4 MG tablet Take 1 tablet (4 mg total) by mouth every 8 (eight) hours as needed for nausea or vomiting. 20 tablet 0   OVER THE COUNTER MEDICATION Take 1 tablet by mouth at bedtime. OTC "stomach pill"     oxyCODONE -acetaminophen  (PERCOCET/ROXICET) 5-325 MG tablet Take 1 tablet by mouth every 6 (six) hours as needed for severe pain (pain score 7-10). (Patient not taking: Reported on 07/26/2023) 28 tablet 0   Semaglutide  (RYBELSUS ) 7 MG  TABS Take 1 tablet (7 mg total) by mouth daily. 90 tablet 1   No facility-administered medications prior to visit.    No Known Allergies  ROS Review of Systems  Constitutional:  Positive for fatigue. Negative for chills and fever.  HENT:  Negative for congestion, sinus pressure, sinus pain and sore throat.   Eyes:  Negative for pain and discharge.  Respiratory:  Negative for cough and shortness of breath.   Cardiovascular:  Negative for chest pain and palpitations.  Gastrointestinal:  Positive for nausea. Negative for abdominal pain, diarrhea and vomiting.  Endocrine: Negative for polydipsia and polyuria.  Genitourinary:  Negative for dysuria, frequency and hematuria.  Musculoskeletal:  Positive for arthralgias (Left hip) and gait problem. Negative for neck pain and neck stiffness.  Skin:  Negative for rash.       Heel ulcer  Neurological:  Negative for dizziness and weakness.  Psychiatric/Behavioral:  Negative for agitation and behavioral problems.       Objective:    Physical Exam Vitals reviewed.  Constitutional:      General: She is not in acute distress.    Appearance: She is not diaphoretic.     Comments: In wheelchair  HENT:     Head: Normocephalic and atraumatic.     Nose: Nose normal. No congestion.     Mouth/Throat:     Mouth: Mucous membranes are moist.     Pharynx: No posterior oropharyngeal erythema.  Eyes:     General: No scleral icterus.    Extraocular Movements: Extraocular movements intact.  Cardiovascular:     Rate and Rhythm: Normal rate and regular rhythm.     Heart sounds: Normal heart sounds. No murmur heard. Pulmonary:     Breath sounds: Normal breath sounds. No wheezing or rales.  Musculoskeletal:     Cervical back: Neck supple. No tenderness.     Right lower leg: No edema.     Left lower leg:  No edema.  Skin:    General: Skin is warm.     Comments: Pressure ulcer over right heel area, dressing in place - image in A&P  Neurological:      General: No focal deficit present.     Mental Status: She is alert and oriented to person, place, and time.  Psychiatric:        Mood and Affect: Mood normal.        Behavior: Behavior normal.     BP 101/65   Pulse 92   Ht 5\' 4"  (1.626 m)   Wt 128 lb (58.1 kg) Comment: pt reported  SpO2 96%   BMI 21.97 kg/m  Wt Readings from Last 3 Encounters:  07/26/23 124 lb 6.4 oz (56.4 kg)  07/05/23 128 lb (58.1 kg)  05/17/23 141 lb (64 kg)    Lab Results  Component Value Date   TSH 1.480 12/12/2021   Lab Results  Component Value Date   WBC 9.8 05/19/2023   HGB 11.2 (L) 05/19/2023   HCT 33.0 (L) 05/19/2023   MCV 91.4 05/19/2023   PLT 174 05/19/2023   Lab Results  Component Value Date   NA 137 05/18/2023   K 3.7 05/18/2023   CO2 21 (L) 05/18/2023   GLUCOSE 117 (H) 05/18/2023   BUN 23 05/18/2023   CREATININE 0.92 05/18/2023   BILITOT 0.8 01/23/2023   ALKPHOS 65 01/23/2023   AST 36 01/23/2023   ALT 24 01/23/2023   PROT 7.6 01/23/2023   ALBUMIN 2.6 (L) 05/21/2023   CALCIUM  9.1 05/21/2023   ANIONGAP 5 05/18/2023   EGFR 44 (L) 11/29/2022   Lab Results  Component Value Date   CHOL 212 (H) 12/12/2021   Lab Results  Component Value Date   HDL 39 (L) 12/12/2021   Lab Results  Component Value Date   LDLCALC 136 (H) 12/12/2021   Lab Results  Component Value Date   TRIG 203 (H) 12/12/2021   Lab Results  Component Value Date   CHOLHDL 5.4 (H) 12/12/2021   Lab Results  Component Value Date   HGBA1C 5.9 (H) 05/17/2023      Assessment & Plan:   Problem List Items Addressed This Visit       Endocrine   Type 2 diabetes mellitus with diabetic neuropathy, unspecified (HCC) - Primary (Chronic)   Lab Results  Component Value Date   HGBA1C 5.9 (H) 05/17/2023   Well-controlled Associated with HTN and HLD On Metformin  1000 mg QD and Rybelsus  7 mg once daily, advised to decrease dose of Metformin  to 500 mg once daily now On Jardiance  10 mg QD for  cardiomyopathy Advised to follow diabetic diet On statin F/u CMP, HbA1c and lipid panel Diabetic eye exam: Advised to follow up with Ophthalmology for diabetic eye exam  Mild numbness of LE likely due to diabetic neuropathy, avoid adding gabapentin due to sedative effect for now        Musculoskeletal and Integument   Closed left hip fracture (HCC)   S/p left hip pinning by Dr Abigail Abler on 05/17/23 Followed by Orthopedic surgery, allowed weightbearing now Continue home PT      Left wrist fracture   Healing well Followed by Orthopedic surgery - Dr. Abigail Abler        [facility] discharge follow-up   Hospital chart reviewed, including discharge summary Medications reconciled and reviewed with the patient in detail      Skin ulcer of right heel with fat layer exposed (HCC)  Right heel pressure ulcer with necrosed surface Has dressing in place Urgent referral to [facility] wound clinic placed        Relevant Orders   AMB referral to wound care center   Physical deconditioning   Has generalized weakness and muscle atrophy Has DM neuropathy limiting her ambulation She would benefit from hospital bed to prevent falls Has chronic leg swelling, which would improve with bed positioning Due to pressure ulcers, patient requires frequent changes in body position and/or has an immediate need for change in body position  Patient has closed left hip fracture and chronic leg swelling due to HFpEF, which requires positioning of the body in ways not feasible with an ordinary bed - would benefit from hospital bed       No orders of the defined types were placed in this encounter.   Follow-up: Return in about 6 weeks (around 08/16/2023) for DM and wound.    Meldon Sport, MD

## 2023-07-05 NOTE — Assessment & Plan Note (Signed)
 Lab Results  Component Value Date   HGBA1C 5.9 (H) 05/17/2023   Well-controlled Associated with HTN and HLD On Metformin 1000 mg QD and Rybelsus 7 mg once daily, advised to decrease dose of Metformin to 500 mg once daily now On Jardiance 10 mg QD for cardiomyopathy Advised to follow diabetic diet On statin F/u CMP, HbA1c and lipid panel Diabetic eye exam: Advised to follow up with Ophthalmology for diabetic eye exam  Mild numbness of LE likely due to diabetic neuropathy, avoid adding gabapentin due to sedative effect for now

## 2023-07-06 DIAGNOSIS — K3184 Gastroparesis: Secondary | ICD-10-CM | POA: Diagnosis not present

## 2023-07-06 DIAGNOSIS — D631 Anemia in chronic kidney disease: Secondary | ICD-10-CM | POA: Diagnosis not present

## 2023-07-06 DIAGNOSIS — C50912 Malignant neoplasm of unspecified site of left female breast: Secondary | ICD-10-CM | POA: Diagnosis not present

## 2023-07-06 DIAGNOSIS — E1143 Type 2 diabetes mellitus with diabetic autonomic (poly)neuropathy: Secondary | ICD-10-CM | POA: Diagnosis not present

## 2023-07-06 DIAGNOSIS — E785 Hyperlipidemia, unspecified: Secondary | ICD-10-CM | POA: Diagnosis not present

## 2023-07-06 DIAGNOSIS — I429 Cardiomyopathy, unspecified: Secondary | ICD-10-CM | POA: Diagnosis not present

## 2023-07-06 DIAGNOSIS — N189 Chronic kidney disease, unspecified: Secondary | ICD-10-CM | POA: Diagnosis not present

## 2023-07-06 DIAGNOSIS — I130 Hypertensive heart and chronic kidney disease with heart failure and stage 1 through stage 4 chronic kidney disease, or unspecified chronic kidney disease: Secondary | ICD-10-CM | POA: Diagnosis not present

## 2023-07-06 DIAGNOSIS — S52502D Unspecified fracture of the lower end of left radius, subsequent encounter for closed fracture with routine healing: Secondary | ICD-10-CM | POA: Diagnosis not present

## 2023-07-06 DIAGNOSIS — G4733 Obstructive sleep apnea (adult) (pediatric): Secondary | ICD-10-CM | POA: Diagnosis not present

## 2023-07-06 DIAGNOSIS — Z7901 Long term (current) use of anticoagulants: Secondary | ICD-10-CM | POA: Diagnosis not present

## 2023-07-06 DIAGNOSIS — I5032 Chronic diastolic (congestive) heart failure: Secondary | ICD-10-CM | POA: Diagnosis not present

## 2023-07-06 DIAGNOSIS — E440 Moderate protein-calorie malnutrition: Secondary | ICD-10-CM | POA: Diagnosis not present

## 2023-07-06 DIAGNOSIS — S72002D Fracture of unspecified part of neck of left femur, subsequent encounter for closed fracture with routine healing: Secondary | ICD-10-CM | POA: Diagnosis not present

## 2023-07-06 DIAGNOSIS — D509 Iron deficiency anemia, unspecified: Secondary | ICD-10-CM | POA: Diagnosis not present

## 2023-07-06 DIAGNOSIS — Z7984 Long term (current) use of oral hypoglycemic drugs: Secondary | ICD-10-CM | POA: Diagnosis not present

## 2023-07-06 DIAGNOSIS — I480 Paroxysmal atrial fibrillation: Secondary | ICD-10-CM | POA: Diagnosis not present

## 2023-07-06 DIAGNOSIS — E1122 Type 2 diabetes mellitus with diabetic chronic kidney disease: Secondary | ICD-10-CM | POA: Diagnosis not present

## 2023-07-06 DIAGNOSIS — E1140 Type 2 diabetes mellitus with diabetic neuropathy, unspecified: Secondary | ICD-10-CM | POA: Diagnosis not present

## 2023-07-06 DIAGNOSIS — Z9181 History of falling: Secondary | ICD-10-CM | POA: Diagnosis not present

## 2023-07-06 NOTE — Assessment & Plan Note (Signed)
 Hospital chart reviewed, including discharge summary Medications reconciled and reviewed with the patient in detail

## 2023-07-06 NOTE — Assessment & Plan Note (Signed)
 Right heel pressure ulcer with necrosed surface Has dressing in place Urgent referral to [facility] wound clinic placed

## 2023-07-06 NOTE — Assessment & Plan Note (Signed)
 Healing well Followed by Orthopedic surgery - Dr. Eulah Pont

## 2023-07-06 NOTE — Telephone Encounter (Unsigned)
 Copied from CRM [id number]. Topic: Clinical - Medication Question >> Jul 05, 2023  4:37 PM Alessandra Bevels wrote: Reason for CRM: Patient's niece Lurena Joiner is calling to check if the patient needs to continue on medicaiton Mirtazapine? Please advise

## 2023-07-06 NOTE — Assessment & Plan Note (Signed)
 S/p left hip pinning by Dr Eulah Pont on 05/17/23 Followed by Orthopedic surgery, allowed weightbearing now Continue home PT

## 2023-07-09 ENCOUNTER — Ambulatory Visit: Admitting: Orthopedic Surgery

## 2023-07-10 ENCOUNTER — Other Ambulatory Visit: Payer: Self-pay | Admitting: Internal Medicine

## 2023-07-10 ENCOUNTER — Other Ambulatory Visit: Payer: Self-pay

## 2023-07-10 ENCOUNTER — Telehealth: Payer: Self-pay | Admitting: Internal Medicine

## 2023-07-10 DIAGNOSIS — S72002D Fracture of unspecified part of neck of left femur, subsequent encounter for closed fracture with routine healing: Secondary | ICD-10-CM | POA: Diagnosis not present

## 2023-07-10 DIAGNOSIS — F5101 Primary insomnia: Secondary | ICD-10-CM

## 2023-07-10 DIAGNOSIS — D631 Anemia in chronic kidney disease: Secondary | ICD-10-CM | POA: Diagnosis not present

## 2023-07-10 DIAGNOSIS — C50912 Malignant neoplasm of unspecified site of left female breast: Secondary | ICD-10-CM | POA: Diagnosis not present

## 2023-07-10 DIAGNOSIS — I429 Cardiomyopathy, unspecified: Secondary | ICD-10-CM | POA: Diagnosis not present

## 2023-07-10 DIAGNOSIS — G4733 Obstructive sleep apnea (adult) (pediatric): Secondary | ICD-10-CM | POA: Diagnosis not present

## 2023-07-10 DIAGNOSIS — E1122 Type 2 diabetes mellitus with diabetic chronic kidney disease: Secondary | ICD-10-CM | POA: Diagnosis not present

## 2023-07-10 DIAGNOSIS — E1143 Type 2 diabetes mellitus with diabetic autonomic (poly)neuropathy: Secondary | ICD-10-CM | POA: Diagnosis not present

## 2023-07-10 DIAGNOSIS — I480 Paroxysmal atrial fibrillation: Secondary | ICD-10-CM | POA: Diagnosis not present

## 2023-07-10 DIAGNOSIS — E440 Moderate protein-calorie malnutrition: Secondary | ICD-10-CM | POA: Diagnosis not present

## 2023-07-10 DIAGNOSIS — Z7901 Long term (current) use of anticoagulants: Secondary | ICD-10-CM | POA: Diagnosis not present

## 2023-07-10 DIAGNOSIS — E1140 Type 2 diabetes mellitus with diabetic neuropathy, unspecified: Secondary | ICD-10-CM | POA: Diagnosis not present

## 2023-07-10 DIAGNOSIS — I130 Hypertensive heart and chronic kidney disease with heart failure and stage 1 through stage 4 chronic kidney disease, or unspecified chronic kidney disease: Secondary | ICD-10-CM | POA: Diagnosis not present

## 2023-07-10 DIAGNOSIS — S52502D Unspecified fracture of the lower end of left radius, subsequent encounter for closed fracture with routine healing: Secondary | ICD-10-CM | POA: Diagnosis not present

## 2023-07-10 DIAGNOSIS — Z9181 History of falling: Secondary | ICD-10-CM | POA: Diagnosis not present

## 2023-07-10 DIAGNOSIS — I5032 Chronic diastolic (congestive) heart failure: Secondary | ICD-10-CM | POA: Diagnosis not present

## 2023-07-10 DIAGNOSIS — N189 Chronic kidney disease, unspecified: Secondary | ICD-10-CM | POA: Diagnosis not present

## 2023-07-10 DIAGNOSIS — D509 Iron deficiency anemia, unspecified: Secondary | ICD-10-CM | POA: Diagnosis not present

## 2023-07-10 DIAGNOSIS — E785 Hyperlipidemia, unspecified: Secondary | ICD-10-CM | POA: Diagnosis not present

## 2023-07-10 DIAGNOSIS — K3184 Gastroparesis: Secondary | ICD-10-CM | POA: Diagnosis not present

## 2023-07-10 DIAGNOSIS — Z7984 Long term (current) use of oral hypoglycemic drugs: Secondary | ICD-10-CM | POA: Diagnosis not present

## 2023-07-10 MED ORDER — MIRTAZAPINE 7.5 MG PO TABS: 7.5000 mg | ORAL_TABLET | Freq: Every day | ORAL | 3 refills | Status: DC

## 2023-07-10 MED ORDER — MIRTAZAPINE 7.5 MG PO TABS: 7.5000 mg | ORAL_TABLET | Freq: Every day | ORAL | 3 refills | Status: AC

## 2023-07-10 NOTE — Telephone Encounter (Signed)
 Copied from CRM [id number]. Topic: Clinical - Medication Question >> Jul 10, 2023 10:43 AM Georgeanna Harrison H wrote: Reason for CRM: Pt's niece states that their pharmacy doe not have the Mirtazepine on record and would need the office to send it over to be refilled.

## 2023-07-10 NOTE — Telephone Encounter (Unsigned)
 Copied from CRM [id number]. Topic: Clinical - Medication Question >> Jul 10, 2023 10:43 AM Georgeanna Harrison H wrote: Reason for CRM: Pt's niece states that their pharmacy doe not have the Mirtazepine on record and would need the office to send it over to be refilled.

## 2023-07-10 NOTE — Telephone Encounter (Signed)
Pt Niece informed.

## 2023-07-11 ENCOUNTER — Other Ambulatory Visit: Payer: Self-pay

## 2023-07-11 DIAGNOSIS — I429 Cardiomyopathy, unspecified: Secondary | ICD-10-CM | POA: Diagnosis not present

## 2023-07-11 DIAGNOSIS — E1122 Type 2 diabetes mellitus with diabetic chronic kidney disease: Secondary | ICD-10-CM | POA: Diagnosis not present

## 2023-07-11 DIAGNOSIS — G4733 Obstructive sleep apnea (adult) (pediatric): Secondary | ICD-10-CM | POA: Diagnosis not present

## 2023-07-11 DIAGNOSIS — N189 Chronic kidney disease, unspecified: Secondary | ICD-10-CM | POA: Diagnosis not present

## 2023-07-11 DIAGNOSIS — E440 Moderate protein-calorie malnutrition: Secondary | ICD-10-CM | POA: Diagnosis not present

## 2023-07-11 DIAGNOSIS — I480 Paroxysmal atrial fibrillation: Secondary | ICD-10-CM | POA: Diagnosis not present

## 2023-07-11 DIAGNOSIS — E1143 Type 2 diabetes mellitus with diabetic autonomic (poly)neuropathy: Secondary | ICD-10-CM | POA: Diagnosis not present

## 2023-07-11 DIAGNOSIS — K3184 Gastroparesis: Secondary | ICD-10-CM | POA: Diagnosis not present

## 2023-07-11 DIAGNOSIS — C50912 Malignant neoplasm of unspecified site of left female breast: Secondary | ICD-10-CM | POA: Diagnosis not present

## 2023-07-11 DIAGNOSIS — D509 Iron deficiency anemia, unspecified: Secondary | ICD-10-CM | POA: Diagnosis not present

## 2023-07-11 DIAGNOSIS — I130 Hypertensive heart and chronic kidney disease with heart failure and stage 1 through stage 4 chronic kidney disease, or unspecified chronic kidney disease: Secondary | ICD-10-CM | POA: Diagnosis not present

## 2023-07-11 DIAGNOSIS — E1140 Type 2 diabetes mellitus with diabetic neuropathy, unspecified: Secondary | ICD-10-CM

## 2023-07-11 DIAGNOSIS — Z7984 Long term (current) use of oral hypoglycemic drugs: Secondary | ICD-10-CM | POA: Diagnosis not present

## 2023-07-11 DIAGNOSIS — Z7901 Long term (current) use of anticoagulants: Secondary | ICD-10-CM | POA: Diagnosis not present

## 2023-07-11 DIAGNOSIS — E785 Hyperlipidemia, unspecified: Secondary | ICD-10-CM | POA: Diagnosis not present

## 2023-07-11 DIAGNOSIS — S52502D Unspecified fracture of the lower end of left radius, subsequent encounter for closed fracture with routine healing: Secondary | ICD-10-CM | POA: Diagnosis not present

## 2023-07-11 DIAGNOSIS — S72002D Fracture of unspecified part of neck of left femur, subsequent encounter for closed fracture with routine healing: Secondary | ICD-10-CM | POA: Diagnosis not present

## 2023-07-11 DIAGNOSIS — Z9181 History of falling: Secondary | ICD-10-CM | POA: Diagnosis not present

## 2023-07-11 DIAGNOSIS — D631 Anemia in chronic kidney disease: Secondary | ICD-10-CM | POA: Diagnosis not present

## 2023-07-11 DIAGNOSIS — I5032 Chronic diastolic (congestive) heart failure: Secondary | ICD-10-CM | POA: Diagnosis not present

## 2023-07-13 DIAGNOSIS — Z7984 Long term (current) use of oral hypoglycemic drugs: Secondary | ICD-10-CM | POA: Diagnosis not present

## 2023-07-13 DIAGNOSIS — K3184 Gastroparesis: Secondary | ICD-10-CM | POA: Diagnosis not present

## 2023-07-13 DIAGNOSIS — I429 Cardiomyopathy, unspecified: Secondary | ICD-10-CM | POA: Diagnosis not present

## 2023-07-13 DIAGNOSIS — E1143 Type 2 diabetes mellitus with diabetic autonomic (poly)neuropathy: Secondary | ICD-10-CM | POA: Diagnosis not present

## 2023-07-13 DIAGNOSIS — S72002D Fracture of unspecified part of neck of left femur, subsequent encounter for closed fracture with routine healing: Secondary | ICD-10-CM | POA: Diagnosis not present

## 2023-07-13 DIAGNOSIS — C50912 Malignant neoplasm of unspecified site of left female breast: Secondary | ICD-10-CM | POA: Diagnosis not present

## 2023-07-13 DIAGNOSIS — N189 Chronic kidney disease, unspecified: Secondary | ICD-10-CM | POA: Diagnosis not present

## 2023-07-13 DIAGNOSIS — E1140 Type 2 diabetes mellitus with diabetic neuropathy, unspecified: Secondary | ICD-10-CM | POA: Diagnosis not present

## 2023-07-13 DIAGNOSIS — E1122 Type 2 diabetes mellitus with diabetic chronic kidney disease: Secondary | ICD-10-CM | POA: Diagnosis not present

## 2023-07-13 DIAGNOSIS — D631 Anemia in chronic kidney disease: Secondary | ICD-10-CM | POA: Diagnosis not present

## 2023-07-13 DIAGNOSIS — Z7901 Long term (current) use of anticoagulants: Secondary | ICD-10-CM | POA: Diagnosis not present

## 2023-07-13 DIAGNOSIS — I480 Paroxysmal atrial fibrillation: Secondary | ICD-10-CM | POA: Diagnosis not present

## 2023-07-13 DIAGNOSIS — D509 Iron deficiency anemia, unspecified: Secondary | ICD-10-CM | POA: Diagnosis not present

## 2023-07-13 DIAGNOSIS — I130 Hypertensive heart and chronic kidney disease with heart failure and stage 1 through stage 4 chronic kidney disease, or unspecified chronic kidney disease: Secondary | ICD-10-CM | POA: Diagnosis not present

## 2023-07-13 DIAGNOSIS — E785 Hyperlipidemia, unspecified: Secondary | ICD-10-CM | POA: Diagnosis not present

## 2023-07-13 DIAGNOSIS — I5032 Chronic diastolic (congestive) heart failure: Secondary | ICD-10-CM | POA: Diagnosis not present

## 2023-07-13 DIAGNOSIS — Z9181 History of falling: Secondary | ICD-10-CM | POA: Diagnosis not present

## 2023-07-13 DIAGNOSIS — E440 Moderate protein-calorie malnutrition: Secondary | ICD-10-CM | POA: Diagnosis not present

## 2023-07-13 DIAGNOSIS — G4733 Obstructive sleep apnea (adult) (pediatric): Secondary | ICD-10-CM | POA: Diagnosis not present

## 2023-07-13 DIAGNOSIS — S52502D Unspecified fracture of the lower end of left radius, subsequent encounter for closed fracture with routine healing: Secondary | ICD-10-CM | POA: Diagnosis not present

## 2023-07-13 LAB — MICROALBUMIN / CREATININE URINE RATIO
Creatinine, Urine: 49.3 mg/dL
Microalb/Creat Ratio: 141 mg/g{creat} — ABNORMAL HIGH (ref 0–29)
Microalbumin, Urine: 69.7 ug/mL

## 2023-07-16 ENCOUNTER — Other Ambulatory Visit: Payer: Self-pay | Admitting: Internal Medicine

## 2023-07-16 DIAGNOSIS — Z7901 Long term (current) use of anticoagulants: Secondary | ICD-10-CM | POA: Diagnosis not present

## 2023-07-16 DIAGNOSIS — E1143 Type 2 diabetes mellitus with diabetic autonomic (poly)neuropathy: Secondary | ICD-10-CM | POA: Diagnosis not present

## 2023-07-16 DIAGNOSIS — D509 Iron deficiency anemia, unspecified: Secondary | ICD-10-CM | POA: Diagnosis not present

## 2023-07-16 DIAGNOSIS — D631 Anemia in chronic kidney disease: Secondary | ICD-10-CM | POA: Diagnosis not present

## 2023-07-16 DIAGNOSIS — E1140 Type 2 diabetes mellitus with diabetic neuropathy, unspecified: Secondary | ICD-10-CM | POA: Diagnosis not present

## 2023-07-16 DIAGNOSIS — Z7984 Long term (current) use of oral hypoglycemic drugs: Secondary | ICD-10-CM | POA: Diagnosis not present

## 2023-07-16 DIAGNOSIS — I480 Paroxysmal atrial fibrillation: Secondary | ICD-10-CM | POA: Diagnosis not present

## 2023-07-16 DIAGNOSIS — G4733 Obstructive sleep apnea (adult) (pediatric): Secondary | ICD-10-CM | POA: Diagnosis not present

## 2023-07-16 DIAGNOSIS — E785 Hyperlipidemia, unspecified: Secondary | ICD-10-CM | POA: Diagnosis not present

## 2023-07-16 DIAGNOSIS — E440 Moderate protein-calorie malnutrition: Secondary | ICD-10-CM | POA: Diagnosis not present

## 2023-07-16 DIAGNOSIS — I429 Cardiomyopathy, unspecified: Secondary | ICD-10-CM | POA: Diagnosis not present

## 2023-07-16 DIAGNOSIS — N189 Chronic kidney disease, unspecified: Secondary | ICD-10-CM | POA: Diagnosis not present

## 2023-07-16 DIAGNOSIS — E1122 Type 2 diabetes mellitus with diabetic chronic kidney disease: Secondary | ICD-10-CM | POA: Diagnosis not present

## 2023-07-16 DIAGNOSIS — K3184 Gastroparesis: Secondary | ICD-10-CM | POA: Diagnosis not present

## 2023-07-16 DIAGNOSIS — Z9181 History of falling: Secondary | ICD-10-CM | POA: Diagnosis not present

## 2023-07-16 DIAGNOSIS — S72002D Fracture of unspecified part of neck of left femur, subsequent encounter for closed fracture with routine healing: Secondary | ICD-10-CM | POA: Diagnosis not present

## 2023-07-16 DIAGNOSIS — I130 Hypertensive heart and chronic kidney disease with heart failure and stage 1 through stage 4 chronic kidney disease, or unspecified chronic kidney disease: Secondary | ICD-10-CM | POA: Diagnosis not present

## 2023-07-16 DIAGNOSIS — N3000 Acute cystitis without hematuria: Secondary | ICD-10-CM

## 2023-07-16 DIAGNOSIS — C50912 Malignant neoplasm of unspecified site of left female breast: Secondary | ICD-10-CM | POA: Diagnosis not present

## 2023-07-16 DIAGNOSIS — S52502D Unspecified fracture of the lower end of left radius, subsequent encounter for closed fracture with routine healing: Secondary | ICD-10-CM | POA: Diagnosis not present

## 2023-07-16 DIAGNOSIS — I5032 Chronic diastolic (congestive) heart failure: Secondary | ICD-10-CM | POA: Diagnosis not present

## 2023-07-16 MED ORDER — NITROFURANTOIN MONOHYD MACRO 100 MG PO CAPS: 100.0000 mg | ORAL_CAPSULE | Freq: Two times a day (BID) | ORAL | 0 refills | Status: DC

## 2023-07-16 NOTE — Telephone Encounter (Signed)
 Copied from CRM [id number]. Topic: Clinical - Lab/Test Results >> Jul 16, 2023  2:52 PM Antwanette L wrote: Reason for CRM: Patient niece([name]) is calling on behalf of the patient to get the status of the pt urine sample. [name] dropped the urine sample last Wednesday or Thursday to see if the pt has a UTI. Ivette Marks is requesting a callback at [phone].

## 2023-07-20 DIAGNOSIS — E440 Moderate protein-calorie malnutrition: Secondary | ICD-10-CM | POA: Diagnosis not present

## 2023-07-20 DIAGNOSIS — I480 Paroxysmal atrial fibrillation: Secondary | ICD-10-CM | POA: Diagnosis not present

## 2023-07-20 DIAGNOSIS — N189 Chronic kidney disease, unspecified: Secondary | ICD-10-CM | POA: Diagnosis not present

## 2023-07-20 DIAGNOSIS — I130 Hypertensive heart and chronic kidney disease with heart failure and stage 1 through stage 4 chronic kidney disease, or unspecified chronic kidney disease: Secondary | ICD-10-CM | POA: Diagnosis not present

## 2023-07-20 DIAGNOSIS — D631 Anemia in chronic kidney disease: Secondary | ICD-10-CM | POA: Diagnosis not present

## 2023-07-20 DIAGNOSIS — I429 Cardiomyopathy, unspecified: Secondary | ICD-10-CM | POA: Diagnosis not present

## 2023-07-20 DIAGNOSIS — Z7901 Long term (current) use of anticoagulants: Secondary | ICD-10-CM | POA: Diagnosis not present

## 2023-07-20 DIAGNOSIS — Z9181 History of falling: Secondary | ICD-10-CM | POA: Diagnosis not present

## 2023-07-20 DIAGNOSIS — C50912 Malignant neoplasm of unspecified site of left female breast: Secondary | ICD-10-CM | POA: Diagnosis not present

## 2023-07-20 DIAGNOSIS — E785 Hyperlipidemia, unspecified: Secondary | ICD-10-CM | POA: Diagnosis not present

## 2023-07-20 DIAGNOSIS — K3184 Gastroparesis: Secondary | ICD-10-CM | POA: Diagnosis not present

## 2023-07-20 DIAGNOSIS — Z7984 Long term (current) use of oral hypoglycemic drugs: Secondary | ICD-10-CM | POA: Diagnosis not present

## 2023-07-20 DIAGNOSIS — E1140 Type 2 diabetes mellitus with diabetic neuropathy, unspecified: Secondary | ICD-10-CM | POA: Diagnosis not present

## 2023-07-20 DIAGNOSIS — S52502D Unspecified fracture of the lower end of left radius, subsequent encounter for closed fracture with routine healing: Secondary | ICD-10-CM | POA: Diagnosis not present

## 2023-07-20 DIAGNOSIS — S72002D Fracture of unspecified part of neck of left femur, subsequent encounter for closed fracture with routine healing: Secondary | ICD-10-CM | POA: Diagnosis not present

## 2023-07-20 DIAGNOSIS — I5032 Chronic diastolic (congestive) heart failure: Secondary | ICD-10-CM | POA: Diagnosis not present

## 2023-07-20 DIAGNOSIS — E1122 Type 2 diabetes mellitus with diabetic chronic kidney disease: Secondary | ICD-10-CM | POA: Diagnosis not present

## 2023-07-20 DIAGNOSIS — G4733 Obstructive sleep apnea (adult) (pediatric): Secondary | ICD-10-CM | POA: Diagnosis not present

## 2023-07-20 DIAGNOSIS — E1143 Type 2 diabetes mellitus with diabetic autonomic (poly)neuropathy: Secondary | ICD-10-CM | POA: Diagnosis not present

## 2023-07-20 DIAGNOSIS — D509 Iron deficiency anemia, unspecified: Secondary | ICD-10-CM | POA: Diagnosis not present

## 2023-07-23 DIAGNOSIS — E1140 Type 2 diabetes mellitus with diabetic neuropathy, unspecified: Secondary | ICD-10-CM | POA: Diagnosis not present

## 2023-07-23 DIAGNOSIS — N189 Chronic kidney disease, unspecified: Secondary | ICD-10-CM | POA: Diagnosis not present

## 2023-07-23 DIAGNOSIS — E1143 Type 2 diabetes mellitus with diabetic autonomic (poly)neuropathy: Secondary | ICD-10-CM | POA: Diagnosis not present

## 2023-07-23 DIAGNOSIS — I480 Paroxysmal atrial fibrillation: Secondary | ICD-10-CM | POA: Diagnosis not present

## 2023-07-23 DIAGNOSIS — S72002D Fracture of unspecified part of neck of left femur, subsequent encounter for closed fracture with routine healing: Secondary | ICD-10-CM | POA: Diagnosis not present

## 2023-07-23 DIAGNOSIS — Z7984 Long term (current) use of oral hypoglycemic drugs: Secondary | ICD-10-CM | POA: Diagnosis not present

## 2023-07-23 DIAGNOSIS — Z7901 Long term (current) use of anticoagulants: Secondary | ICD-10-CM | POA: Diagnosis not present

## 2023-07-23 DIAGNOSIS — D631 Anemia in chronic kidney disease: Secondary | ICD-10-CM | POA: Diagnosis not present

## 2023-07-23 DIAGNOSIS — I130 Hypertensive heart and chronic kidney disease with heart failure and stage 1 through stage 4 chronic kidney disease, or unspecified chronic kidney disease: Secondary | ICD-10-CM | POA: Diagnosis not present

## 2023-07-23 DIAGNOSIS — I429 Cardiomyopathy, unspecified: Secondary | ICD-10-CM | POA: Diagnosis not present

## 2023-07-23 DIAGNOSIS — E440 Moderate protein-calorie malnutrition: Secondary | ICD-10-CM | POA: Diagnosis not present

## 2023-07-23 DIAGNOSIS — E785 Hyperlipidemia, unspecified: Secondary | ICD-10-CM | POA: Diagnosis not present

## 2023-07-23 DIAGNOSIS — I5032 Chronic diastolic (congestive) heart failure: Secondary | ICD-10-CM | POA: Diagnosis not present

## 2023-07-23 DIAGNOSIS — D509 Iron deficiency anemia, unspecified: Secondary | ICD-10-CM | POA: Diagnosis not present

## 2023-07-23 DIAGNOSIS — G4733 Obstructive sleep apnea (adult) (pediatric): Secondary | ICD-10-CM | POA: Diagnosis not present

## 2023-07-23 DIAGNOSIS — Z9181 History of falling: Secondary | ICD-10-CM | POA: Diagnosis not present

## 2023-07-23 DIAGNOSIS — K3184 Gastroparesis: Secondary | ICD-10-CM | POA: Diagnosis not present

## 2023-07-23 DIAGNOSIS — E1122 Type 2 diabetes mellitus with diabetic chronic kidney disease: Secondary | ICD-10-CM | POA: Diagnosis not present

## 2023-07-23 DIAGNOSIS — C50912 Malignant neoplasm of unspecified site of left female breast: Secondary | ICD-10-CM | POA: Diagnosis not present

## 2023-07-23 DIAGNOSIS — S52502D Unspecified fracture of the lower end of left radius, subsequent encounter for closed fracture with routine healing: Secondary | ICD-10-CM | POA: Diagnosis not present

## 2023-07-25 ENCOUNTER — Telehealth: Payer: Self-pay | Admitting: Internal Medicine

## 2023-07-25 ENCOUNTER — Ambulatory Visit: Payer: Self-pay

## 2023-07-25 DIAGNOSIS — Z7984 Long term (current) use of oral hypoglycemic drugs: Secondary | ICD-10-CM | POA: Diagnosis not present

## 2023-07-25 DIAGNOSIS — I4891 Unspecified atrial fibrillation: Secondary | ICD-10-CM | POA: Diagnosis not present

## 2023-07-25 DIAGNOSIS — L97412 Non-pressure chronic ulcer of right heel and midfoot with fat layer exposed: Secondary | ICD-10-CM | POA: Diagnosis not present

## 2023-07-25 DIAGNOSIS — L89619 Pressure ulcer of right heel, unspecified stage: Secondary | ICD-10-CM | POA: Diagnosis not present

## 2023-07-25 DIAGNOSIS — E11621 Type 2 diabetes mellitus with foot ulcer: Secondary | ICD-10-CM | POA: Diagnosis not present

## 2023-07-25 DIAGNOSIS — Z7901 Long term (current) use of anticoagulants: Secondary | ICD-10-CM | POA: Diagnosis not present

## 2023-07-25 DIAGNOSIS — I110 Hypertensive heart disease with heart failure: Secondary | ICD-10-CM | POA: Diagnosis not present

## 2023-07-25 DIAGNOSIS — R5381 Other malaise: Secondary | ICD-10-CM | POA: Insufficient documentation

## 2023-07-25 DIAGNOSIS — I509 Heart failure, unspecified: Secondary | ICD-10-CM | POA: Diagnosis not present

## 2023-07-25 NOTE — Assessment & Plan Note (Addendum)
 Has generalized weakness and muscle atrophy Has DM neuropathy limiting her ambulation She would benefit from hospital bed to prevent falls Has chronic leg swelling, which would improve with bed positioning Due to pressure ulcers, patient requires frequent changes in body position and/or has an immediate need for change in body position  Patient has closed left hip fracture and chronic leg swelling due to HFpEF, which requires positioning of the body in ways not feasible with an ordinary bed - would benefit from hospital bed

## 2023-07-25 NOTE — Telephone Encounter (Signed)
 Copied from CRM [id number]. Topic: General - Other >> Jul 25, 2023 11:57 AM Lizabeth Riggs wrote: Reason for CRM:  [facility] is calling to see if [place] can get an order for a hospital bed. She needs to sleep elevated to breath better and to get in and out of bed safely. Please order through [facility]. Please call niece, Anola Basques, at [phone] when this is completed. Thanks

## 2023-07-25 NOTE — Telephone Encounter (Signed)
   Chief Complaint: bilateral leg swelling Symptoms: tender to touch  Disposition: [] ED /[] Urgent Care (no appt availability in office) / [x] Appointment(In office/virtual)/ []  [facility]/ [] Home Care/ [] Refused Recommended Disposition /[] [facility]/ []  Follow-up with PCP Additional Notes: Pt's niece Anola Basques called with concerns of bilateral leg swelling after wound care appt today. Provider told Anola Basques to follow up with PCP on starting/restarting Lasix . Pt was in rehab but has been home since 3/28. Lasix  wasn't on med list and pt hasn't been taking it. Swelling has traveled to below knee. Pt has developed a cough. Pt did fall yesterday but "wasn't bad." No injuries. Pt denies any SOB. Pt has appt 4/24 @ 10. RN gave care advice and [name] verbalized understanding.            Reason for Disposition  [1] MODERATE leg swelling (e.g., swelling extends up to knees) AND [2] new-onset or worsening  Answer Assessment - Initial Assessment Questions 1. ONSET: "When did the swelling start?" (e.g., minutes, hours, days)     Ongoing  2. LOCATION: "What part of the leg is swollen?"  "Are both legs swollen or just one leg?"     Both feet 3. SEVERITY: "How bad is the swelling?" (e.g., localized; mild, moderate, severe)   - Localized: Small area of swelling localized to one leg.   - MILD pedal edema: Swelling limited to foot and ankle, pitting edema < 1/4 inch (6 mm) deep, rest and elevation eliminate most or all swelling.   - MODERATE edema: Swelling of lower leg to knee, pitting edema > 1/4 inch (6 mm) deep, rest and elevation only partially reduce swelling.   - SEVERE edema: Swelling extends above knee, facial or hand swelling present.      moderate 4. REDNESS: "Does the swelling look red or infected?"     denies 5. PAIN: "Is the swelling painful to touch?" If Yes, ask: "How painful is it?"   (Scale 1-10; mild, moderate or severe)     sore 6. FEVER: "Do you have a  fever?" If Yes, ask: "What is it, how was it measured, and when did it start?"      denies 7. CAUSE: "What do you think is causing the leg swelling?"     Not sure 8. MEDICAL HISTORY: "Do you have a history of blood clots (e.g., DVT), cancer, heart failure, kidney disease, or liver failure?"     A Fib 9. RECURRENT SYMPTOM: "Have you had leg swelling before?" If Yes, ask: "When was the last time?" "What happened that time?"     Not sure 10. OTHER SYMPTOMS: "Do you have any other symptoms?" (e.g., chest pain, difficulty breathing)       cough  Protocols used: Leg Swelling and Edema-A-AH

## 2023-07-26 ENCOUNTER — Other Ambulatory Visit: Payer: Self-pay

## 2023-07-26 ENCOUNTER — Encounter: Payer: Self-pay | Admitting: Family Medicine

## 2023-07-26 ENCOUNTER — Ambulatory Visit (INDEPENDENT_AMBULATORY_CARE_PROVIDER_SITE_OTHER): Admitting: Family Medicine

## 2023-07-26 VITALS — BP 114/71 | HR 98 | Ht 64.0 in | Wt 124.4 lb

## 2023-07-26 DIAGNOSIS — R051 Acute cough: Secondary | ICD-10-CM

## 2023-07-26 DIAGNOSIS — Z7984 Long term (current) use of oral hypoglycemic drugs: Secondary | ICD-10-CM | POA: Diagnosis not present

## 2023-07-26 DIAGNOSIS — N189 Chronic kidney disease, unspecified: Secondary | ICD-10-CM | POA: Diagnosis not present

## 2023-07-26 DIAGNOSIS — S62102D Fracture of unspecified carpal bone, left wrist, subsequent encounter for fracture with routine healing: Secondary | ICD-10-CM

## 2023-07-26 DIAGNOSIS — S52502D Unspecified fracture of the lower end of left radius, subsequent encounter for closed fracture with routine healing: Secondary | ICD-10-CM | POA: Diagnosis not present

## 2023-07-26 DIAGNOSIS — K3184 Gastroparesis: Secondary | ICD-10-CM | POA: Diagnosis not present

## 2023-07-26 DIAGNOSIS — S72002D Fracture of unspecified part of neck of left femur, subsequent encounter for closed fracture with routine healing: Secondary | ICD-10-CM

## 2023-07-26 DIAGNOSIS — G4733 Obstructive sleep apnea (adult) (pediatric): Secondary | ICD-10-CM | POA: Diagnosis not present

## 2023-07-26 DIAGNOSIS — E1143 Type 2 diabetes mellitus with diabetic autonomic (poly)neuropathy: Secondary | ICD-10-CM | POA: Diagnosis not present

## 2023-07-26 DIAGNOSIS — D509 Iron deficiency anemia, unspecified: Secondary | ICD-10-CM | POA: Diagnosis not present

## 2023-07-26 DIAGNOSIS — Z7901 Long term (current) use of anticoagulants: Secondary | ICD-10-CM | POA: Diagnosis not present

## 2023-07-26 DIAGNOSIS — I480 Paroxysmal atrial fibrillation: Secondary | ICD-10-CM

## 2023-07-26 DIAGNOSIS — I5032 Chronic diastolic (congestive) heart failure: Secondary | ICD-10-CM | POA: Diagnosis not present

## 2023-07-26 DIAGNOSIS — I130 Hypertensive heart and chronic kidney disease with heart failure and stage 1 through stage 4 chronic kidney disease, or unspecified chronic kidney disease: Secondary | ICD-10-CM | POA: Diagnosis not present

## 2023-07-26 DIAGNOSIS — R600 Localized edema: Secondary | ICD-10-CM

## 2023-07-26 DIAGNOSIS — E785 Hyperlipidemia, unspecified: Secondary | ICD-10-CM | POA: Diagnosis not present

## 2023-07-26 DIAGNOSIS — I429 Cardiomyopathy, unspecified: Secondary | ICD-10-CM | POA: Diagnosis not present

## 2023-07-26 DIAGNOSIS — E1122 Type 2 diabetes mellitus with diabetic chronic kidney disease: Secondary | ICD-10-CM | POA: Diagnosis not present

## 2023-07-26 DIAGNOSIS — D631 Anemia in chronic kidney disease: Secondary | ICD-10-CM | POA: Diagnosis not present

## 2023-07-26 DIAGNOSIS — E440 Moderate protein-calorie malnutrition: Secondary | ICD-10-CM | POA: Diagnosis not present

## 2023-07-26 DIAGNOSIS — C50912 Malignant neoplasm of unspecified site of left female breast: Secondary | ICD-10-CM | POA: Diagnosis not present

## 2023-07-26 DIAGNOSIS — Z9181 History of falling: Secondary | ICD-10-CM | POA: Diagnosis not present

## 2023-07-26 DIAGNOSIS — E1140 Type 2 diabetes mellitus with diabetic neuropathy, unspecified: Secondary | ICD-10-CM | POA: Diagnosis not present

## 2023-07-26 NOTE — Progress Notes (Signed)
 Established Patient Office Visit   Subjective  Patient ID: Lisa Thornton, female    DOB: Dec 05, 1955  Age: 68 y.o. MRN: [id number]  Chief Complaint  Patient presents with   Leg Swelling    Swelling for several months. She is out of lasix  and would like to restart or have a different medication for fluid retention prescribed.,     She  has a past medical history of Anemia (12/31/2017), Cancer [facility]), Chest pain, Diabetes mellitus without complication (HCC), and Hypertension.  Edema: Patient complains of bilateral leg edema.The edema has been diffuse.  Onset of symptoms was several days ago, gradually worsening since that time. The edema is present all day.  The swelling has been aggravated by recent hip surgery Feb 2025,patient has right side boot due ulcer,  support stockings, elevation of involved area, and been associated with risk for DVT and venous insufficiency. Cardiac risk factors include diabetes mellitus, dyslipidemia, and hypertension.      Review of Systems  Constitutional:  Negative for chills and fever.  Respiratory:  Positive for cough. Negative for shortness of breath.   Cardiovascular:  Positive for leg swelling. Negative for chest pain.  Gastrointestinal:  Negative for abdominal pain.  Genitourinary:  Negative for dysuria.  Neurological:  Negative for dizziness and headaches.      Objective:     BP 114/71   Pulse 98   Ht 5\' 4"  (1.626 m)   Wt 124 lb 6.4 oz (56.4 kg)   SpO2 100%   BMI 21.35 kg/m  BP Readings from Last 3 Encounters:  07/26/23 114/71  07/05/23 101/65  05/21/23 106/63      Physical Exam Vitals reviewed.  Constitutional:      General: She is not in acute distress.    Appearance: Normal appearance. She is not ill-appearing, toxic-appearing or diaphoretic.  HENT:     Head: Normocephalic.  Eyes:     General:        Right eye: No discharge.        Left eye: No discharge.     Conjunctiva/sclera: Conjunctivae normal.  Cardiovascular:      Rate and Rhythm: Normal rate.     Pulses: Normal pulses.     Heart sounds: Normal heart sounds.  Pulmonary:     Effort: Pulmonary effort is normal. No respiratory distress.     Breath sounds: Normal breath sounds.  Musculoskeletal:        General: Swelling and tenderness present. Normal range of motion.     Cervical back: Normal range of motion.     Right lower leg: Edema present.     Left lower leg: Edema present.  Skin:    General: Skin is warm and dry.     Capillary Refill: Capillary refill takes less than 2 seconds.  Neurological:     Mental Status: She is alert.  Psychiatric:        Mood and Affect: Mood normal.        Behavior: Behavior normal.      No results found for any visits on 07/26/23.  The 10-year ASCVD risk score (Arnett DK, et al., 2019) is: 18.5%    Assessment & Plan:  Bilateral leg edema -     VAS US  LOWER EXTREMITY VENOUS (DVT); Future  Acute cough -     COVID-19, Flu A+B and RSV  Bilateral edema of lower extremity Assessment & Plan: Start Lasix  20 mg PRN Ordered US  Venous Doppler Awaiting results will treat accordingly.  Advise patient to visit ED if notice any new alarming symptoms such as chest pain, shortness of breath, headaches, or dizziness. Discussed do not stand or sit in on spot for a long time, Avoid tight clothing that restricts blood flow in your legs. Increase fluid intake with water  to avoid dehydration .      Return if symptoms worsen or fail to improve.   Avelino Lek Amber Bail, FNP

## 2023-07-26 NOTE — Patient Instructions (Signed)

## 2023-07-26 NOTE — Assessment & Plan Note (Signed)
 Start Lasix  20 mg PRN Ordered US  Venous Doppler Awaiting results will treat accordingly.  Advise patient to visit ED if notice any new alarming symptoms such as chest pain, shortness of breath, headaches, or dizziness. Discussed do not stand or sit in on spot for a long time, Avoid tight clothing that restricts blood flow in your legs. Increase fluid intake with water  to avoid dehydration .

## 2023-07-26 NOTE — Telephone Encounter (Signed)
 Order placed , faxed to [phone], spoke to [name] let her know order was faxed.

## 2023-07-27 DIAGNOSIS — N189 Chronic kidney disease, unspecified: Secondary | ICD-10-CM | POA: Diagnosis not present

## 2023-07-27 DIAGNOSIS — E1122 Type 2 diabetes mellitus with diabetic chronic kidney disease: Secondary | ICD-10-CM | POA: Diagnosis not present

## 2023-07-27 DIAGNOSIS — S52502D Unspecified fracture of the lower end of left radius, subsequent encounter for closed fracture with routine healing: Secondary | ICD-10-CM | POA: Diagnosis not present

## 2023-07-27 DIAGNOSIS — Z7901 Long term (current) use of anticoagulants: Secondary | ICD-10-CM | POA: Diagnosis not present

## 2023-07-27 DIAGNOSIS — S72002D Fracture of unspecified part of neck of left femur, subsequent encounter for closed fracture with routine healing: Secondary | ICD-10-CM | POA: Diagnosis not present

## 2023-07-27 DIAGNOSIS — I480 Paroxysmal atrial fibrillation: Secondary | ICD-10-CM | POA: Diagnosis not present

## 2023-07-27 DIAGNOSIS — E440 Moderate protein-calorie malnutrition: Secondary | ICD-10-CM | POA: Diagnosis not present

## 2023-07-27 DIAGNOSIS — Z9181 History of falling: Secondary | ICD-10-CM | POA: Diagnosis not present

## 2023-07-27 DIAGNOSIS — C50912 Malignant neoplasm of unspecified site of left female breast: Secondary | ICD-10-CM | POA: Diagnosis not present

## 2023-07-27 DIAGNOSIS — G4733 Obstructive sleep apnea (adult) (pediatric): Secondary | ICD-10-CM | POA: Diagnosis not present

## 2023-07-27 DIAGNOSIS — I130 Hypertensive heart and chronic kidney disease with heart failure and stage 1 through stage 4 chronic kidney disease, or unspecified chronic kidney disease: Secondary | ICD-10-CM | POA: Diagnosis not present

## 2023-07-27 DIAGNOSIS — I429 Cardiomyopathy, unspecified: Secondary | ICD-10-CM | POA: Diagnosis not present

## 2023-07-27 DIAGNOSIS — E1143 Type 2 diabetes mellitus with diabetic autonomic (poly)neuropathy: Secondary | ICD-10-CM | POA: Diagnosis not present

## 2023-07-27 DIAGNOSIS — D631 Anemia in chronic kidney disease: Secondary | ICD-10-CM | POA: Diagnosis not present

## 2023-07-27 DIAGNOSIS — K3184 Gastroparesis: Secondary | ICD-10-CM | POA: Diagnosis not present

## 2023-07-27 DIAGNOSIS — D509 Iron deficiency anemia, unspecified: Secondary | ICD-10-CM | POA: Diagnosis not present

## 2023-07-27 DIAGNOSIS — E1140 Type 2 diabetes mellitus with diabetic neuropathy, unspecified: Secondary | ICD-10-CM | POA: Diagnosis not present

## 2023-07-27 DIAGNOSIS — E785 Hyperlipidemia, unspecified: Secondary | ICD-10-CM | POA: Diagnosis not present

## 2023-07-27 DIAGNOSIS — I5032 Chronic diastolic (congestive) heart failure: Secondary | ICD-10-CM | POA: Diagnosis not present

## 2023-07-27 DIAGNOSIS — Z7984 Long term (current) use of oral hypoglycemic drugs: Secondary | ICD-10-CM | POA: Diagnosis not present

## 2023-07-28 LAB — COVID-19, FLU A+B AND RSV
Influenza A, NAA: NOT DETECTED
Influenza B, NAA: NOT DETECTED
RSV, NAA: NOT DETECTED
SARS-CoV-2, NAA: NOT DETECTED

## 2023-07-30 ENCOUNTER — Other Ambulatory Visit (HOSPITAL_COMMUNITY)

## 2023-07-30 ENCOUNTER — Ambulatory Visit (HOSPITAL_COMMUNITY)

## 2023-07-30 DIAGNOSIS — C50912 Malignant neoplasm of unspecified site of left female breast: Secondary | ICD-10-CM | POA: Diagnosis not present

## 2023-07-30 DIAGNOSIS — I130 Hypertensive heart and chronic kidney disease with heart failure and stage 1 through stage 4 chronic kidney disease, or unspecified chronic kidney disease: Secondary | ICD-10-CM | POA: Diagnosis not present

## 2023-07-30 DIAGNOSIS — S52502D Unspecified fracture of the lower end of left radius, subsequent encounter for closed fracture with routine healing: Secondary | ICD-10-CM | POA: Diagnosis not present

## 2023-07-30 DIAGNOSIS — E1140 Type 2 diabetes mellitus with diabetic neuropathy, unspecified: Secondary | ICD-10-CM | POA: Diagnosis not present

## 2023-07-30 DIAGNOSIS — E1122 Type 2 diabetes mellitus with diabetic chronic kidney disease: Secondary | ICD-10-CM | POA: Diagnosis not present

## 2023-07-30 DIAGNOSIS — K3184 Gastroparesis: Secondary | ICD-10-CM | POA: Diagnosis not present

## 2023-07-30 DIAGNOSIS — I5032 Chronic diastolic (congestive) heart failure: Secondary | ICD-10-CM | POA: Diagnosis not present

## 2023-07-30 DIAGNOSIS — Z9181 History of falling: Secondary | ICD-10-CM | POA: Diagnosis not present

## 2023-07-30 DIAGNOSIS — S72002D Fracture of unspecified part of neck of left femur, subsequent encounter for closed fracture with routine healing: Secondary | ICD-10-CM | POA: Diagnosis not present

## 2023-07-30 DIAGNOSIS — Z7984 Long term (current) use of oral hypoglycemic drugs: Secondary | ICD-10-CM | POA: Diagnosis not present

## 2023-07-30 DIAGNOSIS — E1143 Type 2 diabetes mellitus with diabetic autonomic (poly)neuropathy: Secondary | ICD-10-CM | POA: Diagnosis not present

## 2023-07-30 DIAGNOSIS — I429 Cardiomyopathy, unspecified: Secondary | ICD-10-CM | POA: Diagnosis not present

## 2023-07-30 DIAGNOSIS — N189 Chronic kidney disease, unspecified: Secondary | ICD-10-CM | POA: Diagnosis not present

## 2023-07-30 DIAGNOSIS — E785 Hyperlipidemia, unspecified: Secondary | ICD-10-CM | POA: Diagnosis not present

## 2023-07-30 DIAGNOSIS — D509 Iron deficiency anemia, unspecified: Secondary | ICD-10-CM | POA: Diagnosis not present

## 2023-07-30 DIAGNOSIS — D631 Anemia in chronic kidney disease: Secondary | ICD-10-CM | POA: Diagnosis not present

## 2023-07-30 DIAGNOSIS — Z7901 Long term (current) use of anticoagulants: Secondary | ICD-10-CM | POA: Diagnosis not present

## 2023-07-30 DIAGNOSIS — I480 Paroxysmal atrial fibrillation: Secondary | ICD-10-CM | POA: Diagnosis not present

## 2023-07-30 DIAGNOSIS — G4733 Obstructive sleep apnea (adult) (pediatric): Secondary | ICD-10-CM | POA: Diagnosis not present

## 2023-07-30 DIAGNOSIS — E440 Moderate protein-calorie malnutrition: Secondary | ICD-10-CM | POA: Diagnosis not present

## 2023-07-31 DIAGNOSIS — I130 Hypertensive heart and chronic kidney disease with heart failure and stage 1 through stage 4 chronic kidney disease, or unspecified chronic kidney disease: Secondary | ICD-10-CM | POA: Diagnosis not present

## 2023-07-31 DIAGNOSIS — I480 Paroxysmal atrial fibrillation: Secondary | ICD-10-CM | POA: Diagnosis not present

## 2023-07-31 DIAGNOSIS — I429 Cardiomyopathy, unspecified: Secondary | ICD-10-CM | POA: Diagnosis not present

## 2023-07-31 DIAGNOSIS — Z9181 History of falling: Secondary | ICD-10-CM | POA: Diagnosis not present

## 2023-07-31 DIAGNOSIS — Z7984 Long term (current) use of oral hypoglycemic drugs: Secondary | ICD-10-CM | POA: Diagnosis not present

## 2023-07-31 DIAGNOSIS — E1143 Type 2 diabetes mellitus with diabetic autonomic (poly)neuropathy: Secondary | ICD-10-CM | POA: Diagnosis not present

## 2023-07-31 DIAGNOSIS — G4733 Obstructive sleep apnea (adult) (pediatric): Secondary | ICD-10-CM | POA: Diagnosis not present

## 2023-07-31 DIAGNOSIS — I5032 Chronic diastolic (congestive) heart failure: Secondary | ICD-10-CM | POA: Diagnosis not present

## 2023-07-31 DIAGNOSIS — S72002D Fracture of unspecified part of neck of left femur, subsequent encounter for closed fracture with routine healing: Secondary | ICD-10-CM | POA: Diagnosis not present

## 2023-07-31 DIAGNOSIS — K3184 Gastroparesis: Secondary | ICD-10-CM | POA: Diagnosis not present

## 2023-07-31 DIAGNOSIS — E440 Moderate protein-calorie malnutrition: Secondary | ICD-10-CM | POA: Diagnosis not present

## 2023-07-31 DIAGNOSIS — E1140 Type 2 diabetes mellitus with diabetic neuropathy, unspecified: Secondary | ICD-10-CM | POA: Diagnosis not present

## 2023-07-31 DIAGNOSIS — S52502D Unspecified fracture of the lower end of left radius, subsequent encounter for closed fracture with routine healing: Secondary | ICD-10-CM | POA: Diagnosis not present

## 2023-07-31 DIAGNOSIS — Z7901 Long term (current) use of anticoagulants: Secondary | ICD-10-CM | POA: Diagnosis not present

## 2023-07-31 DIAGNOSIS — N189 Chronic kidney disease, unspecified: Secondary | ICD-10-CM | POA: Diagnosis not present

## 2023-07-31 DIAGNOSIS — E785 Hyperlipidemia, unspecified: Secondary | ICD-10-CM | POA: Diagnosis not present

## 2023-07-31 DIAGNOSIS — E1122 Type 2 diabetes mellitus with diabetic chronic kidney disease: Secondary | ICD-10-CM | POA: Diagnosis not present

## 2023-07-31 DIAGNOSIS — D631 Anemia in chronic kidney disease: Secondary | ICD-10-CM | POA: Diagnosis not present

## 2023-07-31 DIAGNOSIS — D509 Iron deficiency anemia, unspecified: Secondary | ICD-10-CM | POA: Diagnosis not present

## 2023-07-31 DIAGNOSIS — C50912 Malignant neoplasm of unspecified site of left female breast: Secondary | ICD-10-CM | POA: Diagnosis not present

## 2023-08-02 DIAGNOSIS — S52502D Unspecified fracture of the lower end of left radius, subsequent encounter for closed fracture with routine healing: Secondary | ICD-10-CM | POA: Diagnosis not present

## 2023-08-02 DIAGNOSIS — S72002D Fracture of unspecified part of neck of left femur, subsequent encounter for closed fracture with routine healing: Secondary | ICD-10-CM | POA: Diagnosis not present

## 2023-08-02 DIAGNOSIS — Z794 Long term (current) use of insulin: Secondary | ICD-10-CM | POA: Diagnosis not present

## 2023-08-02 DIAGNOSIS — Z4789 Encounter for other orthopedic aftercare: Secondary | ICD-10-CM | POA: Diagnosis not present

## 2023-08-02 DIAGNOSIS — E1122 Type 2 diabetes mellitus with diabetic chronic kidney disease: Secondary | ICD-10-CM | POA: Diagnosis not present

## 2023-08-03 DIAGNOSIS — Z9181 History of falling: Secondary | ICD-10-CM | POA: Diagnosis not present

## 2023-08-03 DIAGNOSIS — C50912 Malignant neoplasm of unspecified site of left female breast: Secondary | ICD-10-CM | POA: Diagnosis not present

## 2023-08-03 DIAGNOSIS — S52502D Unspecified fracture of the lower end of left radius, subsequent encounter for closed fracture with routine healing: Secondary | ICD-10-CM | POA: Diagnosis not present

## 2023-08-03 DIAGNOSIS — E1143 Type 2 diabetes mellitus with diabetic autonomic (poly)neuropathy: Secondary | ICD-10-CM | POA: Diagnosis not present

## 2023-08-03 DIAGNOSIS — I429 Cardiomyopathy, unspecified: Secondary | ICD-10-CM | POA: Diagnosis not present

## 2023-08-03 DIAGNOSIS — E1140 Type 2 diabetes mellitus with diabetic neuropathy, unspecified: Secondary | ICD-10-CM | POA: Diagnosis not present

## 2023-08-03 DIAGNOSIS — Z7901 Long term (current) use of anticoagulants: Secondary | ICD-10-CM | POA: Diagnosis not present

## 2023-08-03 DIAGNOSIS — D631 Anemia in chronic kidney disease: Secondary | ICD-10-CM | POA: Diagnosis not present

## 2023-08-03 DIAGNOSIS — N189 Chronic kidney disease, unspecified: Secondary | ICD-10-CM | POA: Diagnosis not present

## 2023-08-03 DIAGNOSIS — Z7984 Long term (current) use of oral hypoglycemic drugs: Secondary | ICD-10-CM | POA: Diagnosis not present

## 2023-08-03 DIAGNOSIS — S72002D Fracture of unspecified part of neck of left femur, subsequent encounter for closed fracture with routine healing: Secondary | ICD-10-CM | POA: Diagnosis not present

## 2023-08-03 DIAGNOSIS — G4733 Obstructive sleep apnea (adult) (pediatric): Secondary | ICD-10-CM | POA: Diagnosis not present

## 2023-08-03 DIAGNOSIS — I5032 Chronic diastolic (congestive) heart failure: Secondary | ICD-10-CM | POA: Diagnosis not present

## 2023-08-03 DIAGNOSIS — K3184 Gastroparesis: Secondary | ICD-10-CM | POA: Diagnosis not present

## 2023-08-03 DIAGNOSIS — I480 Paroxysmal atrial fibrillation: Secondary | ICD-10-CM | POA: Diagnosis not present

## 2023-08-03 DIAGNOSIS — D509 Iron deficiency anemia, unspecified: Secondary | ICD-10-CM | POA: Diagnosis not present

## 2023-08-03 DIAGNOSIS — E1122 Type 2 diabetes mellitus with diabetic chronic kidney disease: Secondary | ICD-10-CM | POA: Diagnosis not present

## 2023-08-03 DIAGNOSIS — E440 Moderate protein-calorie malnutrition: Secondary | ICD-10-CM | POA: Diagnosis not present

## 2023-08-03 DIAGNOSIS — I130 Hypertensive heart and chronic kidney disease with heart failure and stage 1 through stage 4 chronic kidney disease, or unspecified chronic kidney disease: Secondary | ICD-10-CM | POA: Diagnosis not present

## 2023-08-03 DIAGNOSIS — E785 Hyperlipidemia, unspecified: Secondary | ICD-10-CM | POA: Diagnosis not present

## 2023-08-06 ENCOUNTER — Telehealth: Payer: Self-pay | Admitting: Internal Medicine

## 2023-08-06 ENCOUNTER — Ambulatory Visit (HOSPITAL_BASED_OUTPATIENT_CLINIC_OR_DEPARTMENT_OTHER)
Admission: RE | Admit: 2023-08-06 | Discharge: 2023-08-06 | Disposition: A | Discharge status: Home / Self Care | Source: Ambulatory Visit | Attending: *Deleted | Admitting: *Deleted

## 2023-08-06 DIAGNOSIS — Z7901 Long term (current) use of anticoagulants: Secondary | ICD-10-CM | POA: Diagnosis not present

## 2023-08-06 DIAGNOSIS — R600 Localized edema: Secondary | ICD-10-CM

## 2023-08-06 DIAGNOSIS — E1122 Type 2 diabetes mellitus with diabetic chronic kidney disease: Secondary | ICD-10-CM | POA: Diagnosis not present

## 2023-08-06 DIAGNOSIS — E440 Moderate protein-calorie malnutrition: Secondary | ICD-10-CM | POA: Diagnosis not present

## 2023-08-06 DIAGNOSIS — E1143 Type 2 diabetes mellitus with diabetic autonomic (poly)neuropathy: Secondary | ICD-10-CM | POA: Diagnosis not present

## 2023-08-06 DIAGNOSIS — E1140 Type 2 diabetes mellitus with diabetic neuropathy, unspecified: Secondary | ICD-10-CM | POA: Diagnosis not present

## 2023-08-06 DIAGNOSIS — G4733 Obstructive sleep apnea (adult) (pediatric): Secondary | ICD-10-CM | POA: Diagnosis not present

## 2023-08-06 DIAGNOSIS — D631 Anemia in chronic kidney disease: Secondary | ICD-10-CM | POA: Diagnosis not present

## 2023-08-06 DIAGNOSIS — I429 Cardiomyopathy, unspecified: Secondary | ICD-10-CM | POA: Diagnosis not present

## 2023-08-06 DIAGNOSIS — S72002D Fracture of unspecified part of neck of left femur, subsequent encounter for closed fracture with routine healing: Secondary | ICD-10-CM | POA: Diagnosis not present

## 2023-08-06 DIAGNOSIS — Z9181 History of falling: Secondary | ICD-10-CM | POA: Diagnosis not present

## 2023-08-06 DIAGNOSIS — S52502D Unspecified fracture of the lower end of left radius, subsequent encounter for closed fracture with routine healing: Secondary | ICD-10-CM | POA: Diagnosis not present

## 2023-08-06 DIAGNOSIS — C50912 Malignant neoplasm of unspecified site of left female breast: Secondary | ICD-10-CM | POA: Diagnosis not present

## 2023-08-06 DIAGNOSIS — I480 Paroxysmal atrial fibrillation: Secondary | ICD-10-CM | POA: Diagnosis not present

## 2023-08-06 DIAGNOSIS — D509 Iron deficiency anemia, unspecified: Secondary | ICD-10-CM | POA: Diagnosis not present

## 2023-08-06 DIAGNOSIS — I5032 Chronic diastolic (congestive) heart failure: Secondary | ICD-10-CM | POA: Diagnosis not present

## 2023-08-06 DIAGNOSIS — I130 Hypertensive heart and chronic kidney disease with heart failure and stage 1 through stage 4 chronic kidney disease, or unspecified chronic kidney disease: Secondary | ICD-10-CM | POA: Diagnosis not present

## 2023-08-06 DIAGNOSIS — E785 Hyperlipidemia, unspecified: Secondary | ICD-10-CM | POA: Diagnosis not present

## 2023-08-06 DIAGNOSIS — N189 Chronic kidney disease, unspecified: Secondary | ICD-10-CM | POA: Diagnosis not present

## 2023-08-06 DIAGNOSIS — Z7984 Long term (current) use of oral hypoglycemic drugs: Secondary | ICD-10-CM | POA: Diagnosis not present

## 2023-08-06 DIAGNOSIS — K3184 Gastroparesis: Secondary | ICD-10-CM | POA: Diagnosis not present

## 2023-08-06 NOTE — Telephone Encounter (Signed)
 CAP forms  Copied Noted Sleeved (put in provider box)  Fax [phone] and get a confirmation number when faxed. Call patient to pick up a copy as well.

## 2023-08-07 DIAGNOSIS — S91301A Unspecified open wound, right foot, initial encounter: Secondary | ICD-10-CM | POA: Diagnosis not present

## 2023-08-07 DIAGNOSIS — E1143 Type 2 diabetes mellitus with diabetic autonomic (poly)neuropathy: Secondary | ICD-10-CM | POA: Diagnosis not present

## 2023-08-07 NOTE — Telephone Encounter (Signed)
 Left detailed vm, with phone number to [facility]

## 2023-08-07 NOTE — Telephone Encounter (Signed)
 Copied from CRM [id number]. Topic: General - Other >> Aug 07, 2023  3:53 PM DeAngela L wrote: Reason for CRM: Patients niece calling about referral that was approved for a bed for her aunt and any additional info about delivered, it has been a little over 2 weeks and she was not sure who to contact to follow up about the delivery date or any additional information she was contacting the Dr office to ask if the office would know the name and number of the company  Patient niece Anola Basques [phone] (M)

## 2023-08-08 DIAGNOSIS — D509 Iron deficiency anemia, unspecified: Secondary | ICD-10-CM | POA: Diagnosis not present

## 2023-08-08 DIAGNOSIS — Z7901 Long term (current) use of anticoagulants: Secondary | ICD-10-CM | POA: Diagnosis not present

## 2023-08-08 DIAGNOSIS — C50912 Malignant neoplasm of unspecified site of left female breast: Secondary | ICD-10-CM | POA: Diagnosis not present

## 2023-08-08 DIAGNOSIS — S52502D Unspecified fracture of the lower end of left radius, subsequent encounter for closed fracture with routine healing: Secondary | ICD-10-CM | POA: Diagnosis not present

## 2023-08-08 DIAGNOSIS — L89613 Pressure ulcer of right heel, stage 3: Secondary | ICD-10-CM | POA: Diagnosis not present

## 2023-08-08 DIAGNOSIS — I4891 Unspecified atrial fibrillation: Secondary | ICD-10-CM | POA: Diagnosis not present

## 2023-08-08 DIAGNOSIS — D631 Anemia in chronic kidney disease: Secondary | ICD-10-CM | POA: Diagnosis not present

## 2023-08-08 DIAGNOSIS — N189 Chronic kidney disease, unspecified: Secondary | ICD-10-CM | POA: Diagnosis not present

## 2023-08-08 DIAGNOSIS — E1122 Type 2 diabetes mellitus with diabetic chronic kidney disease: Secondary | ICD-10-CM | POA: Diagnosis not present

## 2023-08-08 DIAGNOSIS — S72002D Fracture of unspecified part of neck of left femur, subsequent encounter for closed fracture with routine healing: Secondary | ICD-10-CM | POA: Diagnosis not present

## 2023-08-08 DIAGNOSIS — L97412 Non-pressure chronic ulcer of right heel and midfoot with fat layer exposed: Secondary | ICD-10-CM | POA: Diagnosis not present

## 2023-08-08 DIAGNOSIS — G4733 Obstructive sleep apnea (adult) (pediatric): Secondary | ICD-10-CM | POA: Diagnosis not present

## 2023-08-08 DIAGNOSIS — E11621 Type 2 diabetes mellitus with foot ulcer: Secondary | ICD-10-CM | POA: Diagnosis not present

## 2023-08-08 DIAGNOSIS — E440 Moderate protein-calorie malnutrition: Secondary | ICD-10-CM | POA: Diagnosis not present

## 2023-08-08 DIAGNOSIS — I509 Heart failure, unspecified: Secondary | ICD-10-CM | POA: Diagnosis not present

## 2023-08-08 DIAGNOSIS — K3184 Gastroparesis: Secondary | ICD-10-CM | POA: Diagnosis not present

## 2023-08-08 DIAGNOSIS — I480 Paroxysmal atrial fibrillation: Secondary | ICD-10-CM | POA: Diagnosis not present

## 2023-08-08 DIAGNOSIS — I110 Hypertensive heart disease with heart failure: Secondary | ICD-10-CM | POA: Diagnosis not present

## 2023-08-08 DIAGNOSIS — Z9181 History of falling: Secondary | ICD-10-CM | POA: Diagnosis not present

## 2023-08-08 DIAGNOSIS — E1140 Type 2 diabetes mellitus with diabetic neuropathy, unspecified: Secondary | ICD-10-CM | POA: Diagnosis not present

## 2023-08-08 DIAGNOSIS — I429 Cardiomyopathy, unspecified: Secondary | ICD-10-CM | POA: Diagnosis not present

## 2023-08-08 DIAGNOSIS — I130 Hypertensive heart and chronic kidney disease with heart failure and stage 1 through stage 4 chronic kidney disease, or unspecified chronic kidney disease: Secondary | ICD-10-CM | POA: Diagnosis not present

## 2023-08-08 DIAGNOSIS — Z7984 Long term (current) use of oral hypoglycemic drugs: Secondary | ICD-10-CM | POA: Diagnosis not present

## 2023-08-08 DIAGNOSIS — E1143 Type 2 diabetes mellitus with diabetic autonomic (poly)neuropathy: Secondary | ICD-10-CM | POA: Diagnosis not present

## 2023-08-08 DIAGNOSIS — E785 Hyperlipidemia, unspecified: Secondary | ICD-10-CM | POA: Diagnosis not present

## 2023-08-08 DIAGNOSIS — I5032 Chronic diastolic (congestive) heart failure: Secondary | ICD-10-CM | POA: Diagnosis not present

## 2023-08-09 ENCOUNTER — Encounter (HOSPITAL_COMMUNITY): Payer: Self-pay

## 2023-08-10 ENCOUNTER — Telehealth: Payer: Self-pay | Admitting: Internal Medicine

## 2023-08-10 NOTE — Telephone Encounter (Signed)
 Copied from CRM [id number]. Topic: General - Other >> Aug 07, 2023  3:53 PM DeAngela L wrote: Reason for CRM: Patients niece calling about referral that was approved for a bed for her aunt and any additional info about delivered, it has been a little over 2 weeks and she was not sure who to contact to follow up about the delivery date or any additional information she was contacting the Dr office to ask if the office would know the name and number of the company  Patient niece Anola Basques [phone] Melven Stable) >> Aug 10, 2023 12:12 PM Donald Frost wrote: The niece of the patient Anola Basques called to check on the status of a company that would deliver a hospital bed. She said [facility] doesn't do it themselves but they work with companies that do. Please assist further with more information like the company name because the patient needs this as soon as possible. Please call [name] at [phone]

## 2023-08-10 NOTE — Telephone Encounter (Signed)
 Called niece back no answer. I resubmitted the request for the hospital bed to [facility] and it should be delivered if there are no issues with insurance in a few days

## 2023-08-13 DIAGNOSIS — Z7984 Long term (current) use of oral hypoglycemic drugs: Secondary | ICD-10-CM | POA: Diagnosis not present

## 2023-08-13 DIAGNOSIS — E1140 Type 2 diabetes mellitus with diabetic neuropathy, unspecified: Secondary | ICD-10-CM | POA: Diagnosis not present

## 2023-08-13 DIAGNOSIS — I5032 Chronic diastolic (congestive) heart failure: Secondary | ICD-10-CM | POA: Diagnosis not present

## 2023-08-13 DIAGNOSIS — E785 Hyperlipidemia, unspecified: Secondary | ICD-10-CM | POA: Diagnosis not present

## 2023-08-13 DIAGNOSIS — K3184 Gastroparesis: Secondary | ICD-10-CM | POA: Diagnosis not present

## 2023-08-13 DIAGNOSIS — E1122 Type 2 diabetes mellitus with diabetic chronic kidney disease: Secondary | ICD-10-CM | POA: Diagnosis not present

## 2023-08-13 DIAGNOSIS — I480 Paroxysmal atrial fibrillation: Secondary | ICD-10-CM | POA: Diagnosis not present

## 2023-08-13 DIAGNOSIS — E440 Moderate protein-calorie malnutrition: Secondary | ICD-10-CM | POA: Diagnosis not present

## 2023-08-13 DIAGNOSIS — S72002D Fracture of unspecified part of neck of left femur, subsequent encounter for closed fracture with routine healing: Secondary | ICD-10-CM | POA: Diagnosis not present

## 2023-08-13 DIAGNOSIS — I429 Cardiomyopathy, unspecified: Secondary | ICD-10-CM | POA: Diagnosis not present

## 2023-08-13 DIAGNOSIS — S52502D Unspecified fracture of the lower end of left radius, subsequent encounter for closed fracture with routine healing: Secondary | ICD-10-CM | POA: Diagnosis not present

## 2023-08-13 DIAGNOSIS — D631 Anemia in chronic kidney disease: Secondary | ICD-10-CM | POA: Diagnosis not present

## 2023-08-13 DIAGNOSIS — Z9181 History of falling: Secondary | ICD-10-CM | POA: Diagnosis not present

## 2023-08-13 DIAGNOSIS — C50912 Malignant neoplasm of unspecified site of left female breast: Secondary | ICD-10-CM | POA: Diagnosis not present

## 2023-08-13 DIAGNOSIS — Z7901 Long term (current) use of anticoagulants: Secondary | ICD-10-CM | POA: Diagnosis not present

## 2023-08-13 DIAGNOSIS — D509 Iron deficiency anemia, unspecified: Secondary | ICD-10-CM | POA: Diagnosis not present

## 2023-08-13 DIAGNOSIS — N189 Chronic kidney disease, unspecified: Secondary | ICD-10-CM | POA: Diagnosis not present

## 2023-08-13 DIAGNOSIS — G4733 Obstructive sleep apnea (adult) (pediatric): Secondary | ICD-10-CM | POA: Diagnosis not present

## 2023-08-13 DIAGNOSIS — I130 Hypertensive heart and chronic kidney disease with heart failure and stage 1 through stage 4 chronic kidney disease, or unspecified chronic kidney disease: Secondary | ICD-10-CM | POA: Diagnosis not present

## 2023-08-13 DIAGNOSIS — E1143 Type 2 diabetes mellitus with diabetic autonomic (poly)neuropathy: Secondary | ICD-10-CM | POA: Diagnosis not present

## 2023-08-13 NOTE — Telephone Encounter (Signed)
 [facility] requested the face to face visit notes be sent to them before they could process the hospital bed order. Visit notes faxed to them and just wanting to see if anything further is needed or for [employer] to complete insurance authorization and process DME order

## 2023-08-14 DIAGNOSIS — R2689 Other abnormalities of gait and mobility: Secondary | ICD-10-CM | POA: Diagnosis not present

## 2023-08-14 DIAGNOSIS — R262 Difficulty in walking, not elsewhere classified: Secondary | ICD-10-CM | POA: Diagnosis not present

## 2023-08-14 DIAGNOSIS — I509 Heart failure, unspecified: Secondary | ICD-10-CM | POA: Diagnosis not present

## 2023-08-15 DIAGNOSIS — I130 Hypertensive heart and chronic kidney disease with heart failure and stage 1 through stage 4 chronic kidney disease, or unspecified chronic kidney disease: Secondary | ICD-10-CM | POA: Diagnosis not present

## 2023-08-15 DIAGNOSIS — D631 Anemia in chronic kidney disease: Secondary | ICD-10-CM | POA: Diagnosis not present

## 2023-08-15 DIAGNOSIS — I429 Cardiomyopathy, unspecified: Secondary | ICD-10-CM | POA: Diagnosis not present

## 2023-08-15 DIAGNOSIS — E1140 Type 2 diabetes mellitus with diabetic neuropathy, unspecified: Secondary | ICD-10-CM | POA: Diagnosis not present

## 2023-08-15 DIAGNOSIS — D509 Iron deficiency anemia, unspecified: Secondary | ICD-10-CM | POA: Diagnosis not present

## 2023-08-15 DIAGNOSIS — S52502D Unspecified fracture of the lower end of left radius, subsequent encounter for closed fracture with routine healing: Secondary | ICD-10-CM | POA: Diagnosis not present

## 2023-08-15 DIAGNOSIS — E785 Hyperlipidemia, unspecified: Secondary | ICD-10-CM | POA: Diagnosis not present

## 2023-08-15 DIAGNOSIS — G4733 Obstructive sleep apnea (adult) (pediatric): Secondary | ICD-10-CM | POA: Diagnosis not present

## 2023-08-15 DIAGNOSIS — C50912 Malignant neoplasm of unspecified site of left female breast: Secondary | ICD-10-CM | POA: Diagnosis not present

## 2023-08-15 DIAGNOSIS — N189 Chronic kidney disease, unspecified: Secondary | ICD-10-CM | POA: Diagnosis not present

## 2023-08-15 DIAGNOSIS — I5032 Chronic diastolic (congestive) heart failure: Secondary | ICD-10-CM | POA: Diagnosis not present

## 2023-08-15 DIAGNOSIS — E1122 Type 2 diabetes mellitus with diabetic chronic kidney disease: Secondary | ICD-10-CM | POA: Diagnosis not present

## 2023-08-15 DIAGNOSIS — Z7984 Long term (current) use of oral hypoglycemic drugs: Secondary | ICD-10-CM | POA: Diagnosis not present

## 2023-08-15 DIAGNOSIS — Z7901 Long term (current) use of anticoagulants: Secondary | ICD-10-CM | POA: Diagnosis not present

## 2023-08-15 DIAGNOSIS — K3184 Gastroparesis: Secondary | ICD-10-CM | POA: Diagnosis not present

## 2023-08-15 DIAGNOSIS — E1143 Type 2 diabetes mellitus with diabetic autonomic (poly)neuropathy: Secondary | ICD-10-CM | POA: Diagnosis not present

## 2023-08-15 DIAGNOSIS — I480 Paroxysmal atrial fibrillation: Secondary | ICD-10-CM | POA: Diagnosis not present

## 2023-08-15 DIAGNOSIS — E440 Moderate protein-calorie malnutrition: Secondary | ICD-10-CM | POA: Diagnosis not present

## 2023-08-15 DIAGNOSIS — S72002D Fracture of unspecified part of neck of left femur, subsequent encounter for closed fracture with routine healing: Secondary | ICD-10-CM | POA: Diagnosis not present

## 2023-08-15 DIAGNOSIS — Z9181 History of falling: Secondary | ICD-10-CM | POA: Diagnosis not present

## 2023-08-16 ENCOUNTER — Encounter: Payer: Self-pay | Admitting: Internal Medicine

## 2023-08-16 ENCOUNTER — Ambulatory Visit (INDEPENDENT_AMBULATORY_CARE_PROVIDER_SITE_OTHER): Payer: Self-pay | Admitting: Internal Medicine

## 2023-08-16 VITALS — BP 96/63 | HR 82 | Ht 64.0 in | Wt 124.0 lb

## 2023-08-16 DIAGNOSIS — Z7984 Long term (current) use of oral hypoglycemic drugs: Secondary | ICD-10-CM | POA: Diagnosis not present

## 2023-08-16 DIAGNOSIS — I4891 Unspecified atrial fibrillation: Secondary | ICD-10-CM

## 2023-08-16 DIAGNOSIS — Z170 Estrogen receptor positive status [ER+]: Secondary | ICD-10-CM

## 2023-08-16 DIAGNOSIS — I10 Essential (primary) hypertension: Secondary | ICD-10-CM

## 2023-08-16 DIAGNOSIS — C50412 Malignant neoplasm of upper-outer quadrant of left female breast: Secondary | ICD-10-CM | POA: Diagnosis not present

## 2023-08-16 DIAGNOSIS — I5032 Chronic diastolic (congestive) heart failure: Secondary | ICD-10-CM

## 2023-08-16 DIAGNOSIS — L97412 Non-pressure chronic ulcer of right heel and midfoot with fat layer exposed: Secondary | ICD-10-CM

## 2023-08-16 DIAGNOSIS — E1140 Type 2 diabetes mellitus with diabetic neuropathy, unspecified: Secondary | ICD-10-CM | POA: Diagnosis not present

## 2023-08-16 DIAGNOSIS — E782 Mixed hyperlipidemia: Secondary | ICD-10-CM | POA: Diagnosis not present

## 2023-08-16 DIAGNOSIS — R110 Nausea: Secondary | ICD-10-CM | POA: Diagnosis not present

## 2023-08-16 MED ORDER — METOPROLOL SUCCINATE ER 100 MG PO TB24: 100.0000 mg | ORAL_TABLET | Freq: Two times a day (BID) | ORAL | 3 refills | Status: AC

## 2023-08-16 MED ORDER — TRAMADOL HCL 50 MG PO TABS: 50.0000 mg | ORAL_TABLET | Freq: Two times a day (BID) | ORAL | 1 refills | Status: DC | PRN

## 2023-08-16 MED ORDER — FUROSEMIDE 40 MG PO TABS
20.0000 mg | ORAL_TABLET | Freq: Every day | ORAL | 3 refills | Status: AC | PRN
Start: 2023-08-16 — End: ?

## 2023-08-16 MED ORDER — ATORVASTATIN CALCIUM 10 MG PO TABS
10.0000 mg | ORAL_TABLET | Freq: Every day | ORAL | 3 refills | Status: AC
Start: 2023-08-16 — End: ?

## 2023-08-16 MED ORDER — ONDANSETRON HCL 4 MG PO TABS: 4.0000 mg | ORAL_TABLET | Freq: Three times a day (TID) | ORAL | 0 refills | Status: AC | PRN

## 2023-08-16 MED ORDER — RYBELSUS 7 MG PO TABS: 7.0000 mg | ORAL_TABLET | Freq: Every day | ORAL | 1 refills | Status: DC

## 2023-08-16 MED ORDER — METFORMIN HCL ER 500 MG PO TB24: 500.0000 mg | ORAL_TABLET | Freq: Every day | ORAL | 3 refills | Status: AC

## 2023-08-16 MED ORDER — EMPAGLIFLOZIN 10 MG PO TABS: 10.0000 mg | ORAL_TABLET | Freq: Every day | ORAL | 3 refills | Status: AC

## 2023-08-16 MED ORDER — APIXABAN 5 MG PO TABS: 5.0000 mg | ORAL_TABLET | Freq: Two times a day (BID) | ORAL | 3 refills | Status: AC

## 2023-08-16 MED ORDER — ANASTROZOLE 1 MG PO TABS: 1.0000 mg | ORAL_TABLET | Freq: Every day | ORAL | 1 refills | Status: AC

## 2023-08-16 MED ORDER — ACCU-CHEK SOFTCLIX LANCETS MISC: 0 refills | Status: DC

## 2023-08-16 NOTE — Assessment & Plan Note (Signed)
 BP Readings from Last 1 Encounters:  08/16/23 96/63   Well-controlled with metoprolol  100 mg BID DC Losartan  due to low normal BP and fatigue Recently discontinued amlodipine , HCTZ due to hypotension and AKI Counseled for compliance with the medications Advised DASH diet and moderate exercise/walking as tolerated

## 2023-08-16 NOTE — Assessment & Plan Note (Signed)
Has lost follow up with Oncology, advised to schedule appointment S/p mastectomy and chemotherapy On Anastrazole since 11/19

## 2023-08-16 NOTE — Assessment & Plan Note (Signed)
On Lipitor 10 mg once daily Check lipid profile

## 2023-08-16 NOTE — Assessment & Plan Note (Signed)
Likely due to poor p.o. intake and from Rybelsus Needs to maintenance small, frequent meals Zofran as needed for nausea

## 2023-08-16 NOTE — Assessment & Plan Note (Addendum)
 Right heel pressure ulcer healing well now Has dressing in place Followed by [facility] wound clinic - planned to get US  ABI Tylenol  for mild to moderate pain, tramadol for severe pain

## 2023-08-16 NOTE — Progress Notes (Signed)
 Established Patient Office Visit  Subjective:  Patient ID: Lisa Thornton, female    DOB: 1956-01-17  Age: 68 y.o. MRN: [id number]  CC:  Chief Complaint  Patient presents with   Medical Management of Chronic Issues    F/u for wound on leg, reports pain is a 10/10. Would like refills on all her meds.    HPI Lisa Thornton is a 68 y.o. female with past medical history of HTN, A. Fib., type 2 DM and breast ca s/p mastectomy and chemotherapy who presents for f/u of her chronic medical conditions.  Type II DM: Her last HbA1c was 5.9 in 02/25.  She takes metformin  500 mg QD, Rybelsus  7 mg QD and Jardiance  10 mg QD currently.  Her dose of metformin  was recently reduced from 1000 mg to 500 mg.  She has noticed mild improvement in her diarrhea.  Her blood glucose has been around 110-150 most of the time. Denies any polyuria or polyphagia. Denies any dysuria or hematuria.  HTN: BP is low normal today. Takes medications regularly. Patient denies headache, dizziness, chest pain, dyspnea or palpitations.   Atrial fibrillation: She has had atrial fibrillation since 2023.  She is on Eliquis  5 mg BID and Metoprolol  100 mg BID, followed by cardiology. She had A fib. With RVR in 08/24 due to sepsis and was admitted at [facility]. She denies any dizziness or palpitations currently.  Denies any active bleeding.  She has noticed mild improvement in sleep quality and appetite with mirtazapine .  Her weight has been overall steady for the last 4 weeks.  Right heel wound: She is followed by wound care clinic in [place] now.  Wound has been healing well overall.  She has had 2 sessions of debridement.  Denies any fever or chills.  She has severe pain of right heel at times, has tried taking Tylenol  without much relief.  Past Medical History:  Diagnosis Date   Anemia 12/31/2017   Cancer (HCC)    left breast cancer   Chest pain    Diabetes mellitus without complication (HCC)    Hypertension     Past  Surgical History:  Procedure Laterality Date   COLONOSCOPY WITH PROPOFOL  N/A 09/27/2021   Procedure: COLONOSCOPY WITH PROPOFOL ;  Surgeon: Urban Garden, MD;  Location: [facility];  Service: Gastroenterology;  Laterality: N/A;  945   MASTECTOMY MODIFIED RADICAL Left 12/31/2017   Procedure: LEFT MODIFIED RADICAL MASTECTOMY;  Surgeon: Alanda Allegra, MD;  Location: [facility] ORS;  Service: General;  Laterality: Left;   PERCUTANEOUS PINNING Left 05/17/2023   Procedure: LEFT HIP PINNING;  Surgeon: Saundra Curl, MD;  Location: [facility] OR;  Service: Orthopedics;  Laterality: Left;   POLYPECTOMY  09/27/2021   Procedure: POLYPECTOMY;  Surgeon: Urban Garden, MD;  Location: [facility];  Service: Gastroenterology;;   PORTACATH PLACEMENT Right 06/27/2017   Procedure: INSERTION PORT-A-CATH;  Surgeon: Alanda Allegra, MD;  Location: [facility] OR[facility];  Service: General;  Laterality: Right;    Family History  Problem Relation Age of Onset   Breast cancer Mother    Thyroid  disease Mother    Heart disease Mother    Heart attack Father    Heart attack Sister    Hypertension Sister    Heart attack Brother    Cancer Sister    Stroke Brother    Alzheimer's disease Maternal Aunt     Social History   Socioeconomic History   Marital status: Divorced    Spouse  name: Not on file   Number of children: 2   Years of education: 10   Highest education level: 10th grade  Occupational History   Occupation: House Wife  Tobacco Use   Smoking status: Never   Smokeless tobacco: Never  Vaping Use   Vaping status: Never Used  Substance and Sexual Activity   Alcohol use: No   Drug use: No   Sexual activity: Not Currently  Other Topics Concern   Not on file  Social History Narrative   Lives with sister   2 children   Dog: Cozie      Enjoys: puzzles, sewing, and walk      Diet: eats all food groups outside: leafy greens    Caffeine: limited, sweet tea   Water : 6-8 cups daily       No car; does  have license, wears seat belt   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   [place] area    Social Drivers of Health   Financial Resource Strain: Low Risk  (03/29/2021)   Overall Financial Resource Strain (CARDIA)    Difficulty of Paying Living Expenses: Not hard at all  Food Insecurity: No Food Insecurity (05/17/2023)   Hunger Vital Sign    Worried About Running Out of Food in the Last Year: Never true    Ran Out of Food in the Last Year: Never true  Transportation Needs: No Transportation Needs (05/17/2023)   PRAPARE - Administrator, Civil Service (Medical): No    Lack of Transportation (Non-Medical): No  Physical Activity: Insufficiently Active (03/29/2021)   Exercise Vital Sign    Days of Exercise per Week: 7 days    Minutes of Exercise per Session: 20 min  Stress: No Stress Concern Present (03/29/2021)   [employer] of Occupational Health - Occupational Stress Questionnaire    Feeling of Stress : Not at all  Social Connections: Socially Isolated (05/17/2023)   Social Connection and Isolation Panel [NHANES]    Frequency of Communication with Friends and Family: More than three times a week    Frequency of Social Gatherings with Friends and Family: Twice a week    Attends Religious Services: Never    Database administrator or Organizations: No    Attends Banker Meetings: Never    Marital Status: Divorced  Catering manager Violence: Not At Risk (05/17/2023)   Humiliation, Afraid, Rape, and Kick questionnaire    Fear of Current or Ex-Partner: No    Emotionally Abused: No    Physically Abused: No    Sexually Abused: No    Outpatient Medications Prior to Visit  Medication Sig Dispense Refill   blood glucose meter kit and supplies Dispense based on patient and insurance preference. Use up to four times daily as directed. (FOR ICD-10 E10.9, E11.9). 1 each 0   cetirizine (ZYRTEC) 10 MG tablet Take 10 mg by mouth daily.     chlorhexidine  (HIBICLENS ) 4 % external  liquid Apply 15 mLs (1 Application total) topically as directed for 30 doses. Use as directed daily for 5 days every other week for 6 weeks. 946 mL 1   Cholecalciferol  (VITAMIN D -3 PO) Take 1 tablet by mouth daily.     feeding supplement (ENSURE ENLIVE / ENSURE PLUS) LIQD Take 237 mLs by mouth 2 (two) times daily between meals.     glucose blood (ACCU-CHEK GUIDE) test strip USE 1 STRIP TO CHECK GLUCOSE THREE TIMES DAILY MORNING,  NOON  AND  AT  BEDTIME  AS  DIRECTED 100 each 0   lactobacillus (FLORANEX/LACTINEX) PACK Take 1 packet by mouth 3 (three) times daily with meals.     magnesium  oxide (MAG-OX) 400 (240 Mg) MG tablet Take 1 tablet (400 mg total) by mouth daily. (Patient taking differently: Take 400 mg by mouth at bedtime.)     mirtazapine  (REMERON ) 7.5 MG tablet Take 1 tablet (7.5 mg total) by mouth at bedtime. 30 tablet 3   mupirocin  ointment (BACTROBAN ) 2 % Apply 1 Application topically 2 (two) times daily.     OVER THE COUNTER MEDICATION Take 1 tablet by mouth at bedtime. OTC "stomach pill"     Accu-Chek Softclix Lancets lancets USE 1  TO CHECK GLUCOSE UP TO 4 TIMES DAILY 100 each 0   anastrozole  (ARIMIDEX ) 1 MG tablet Take 1 tablet by mouth once daily 90 tablet 0   apixaban  (ELIQUIS ) 5 MG TABS tablet Take 1 tablet (5 mg total) by mouth 2 (two) times daily. 180 tablet 3   atorvastatin  (LIPITOR) 10 MG tablet Take 1 tablet (10 mg total) by mouth at bedtime. 90 tablet 0   empagliflozin  (JARDIANCE ) 10 MG TABS tablet Take 1 tablet (10 mg total) by mouth daily. 90 tablet 3   furosemide  (LASIX ) 40 MG tablet Take 20 mg by mouth daily as needed (swelling).     losartan  (COZAAR ) 25 MG tablet Take 0.5 tablets (12.5 mg total) by mouth daily. 45 tablet 3   metFORMIN  (GLUCOPHAGE ) 1000 MG tablet Take 1 tablet (1,000 mg total) by mouth daily with breakfast. 90 tablet 1   metoprolol  tartrate (LOPRESSOR ) 100 MG tablet Take 100 mg by mouth 2 (two) times daily.     nitrofurantoin , macrocrystal-monohydrate,  (MACROBID ) 100 MG capsule Take 1 capsule (100 mg total) by mouth 2 (two) times daily. 10 capsule 0   ondansetron  (ZOFRAN ) 4 MG tablet Take 1 tablet (4 mg total) by mouth every 8 (eight) hours as needed for nausea or vomiting. 20 tablet 0   oxyCODONE  (OXY IR/ROXICODONE ) 5 MG immediate release tablet Take 5 mg by mouth 4 (four) times daily as needed.     oxyCODONE -acetaminophen  (PERCOCET/ROXICET) 5-325 MG tablet Take 1 tablet by mouth every 6 (six) hours as needed for severe pain (pain score 7-10). 28 tablet 0   Semaglutide  (RYBELSUS ) 7 MG TABS Take 1 tablet (7 mg total) by mouth daily. 90 tablet 1   metoprolol  succinate (TOPROL -XL) 100 MG 24 hr tablet Take 1 tablet (100 mg total) by mouth 2 (two) times daily. Take with or immediately following a meal. 180 tablet 3   No facility-administered medications prior to visit.    No Known Allergies  ROS Review of Systems  Constitutional:  Positive for fatigue. Negative for chills and fever.  HENT:  Negative for congestion, sinus pressure, sinus pain and sore throat.   Eyes:  Negative for pain and discharge.  Respiratory:  Negative for cough and shortness of breath.   Cardiovascular:  Negative for chest pain and palpitations.  Gastrointestinal:  Positive for nausea. Negative for abdominal pain, diarrhea and vomiting.  Endocrine: Negative for polydipsia and polyuria.  Genitourinary:  Negative for dysuria, frequency and hematuria.  Musculoskeletal:  Negative for neck pain and neck stiffness.  Skin:  Positive for wound. Negative for rash.  Neurological:  Negative for dizziness and weakness.  Psychiatric/Behavioral:  Negative for agitation and behavioral problems.       Objective:    Physical Exam Vitals reviewed.  Constitutional:      General: She  is not in acute distress.    Appearance: She is not diaphoretic.     Comments: In wheelchair  HENT:     Head: Normocephalic and atraumatic.     Nose: Nose normal. No congestion.     Mouth/Throat:      Mouth: Mucous membranes are moist.     Pharynx: No posterior oropharyngeal erythema.  Eyes:     General: No scleral icterus.    Extraocular Movements: Extraocular movements intact.  Cardiovascular:     Rate and Rhythm: Normal rate. Rhythm irregular.     Heart sounds: Normal heart sounds. No murmur heard. Pulmonary:     Breath sounds: Normal breath sounds. No wheezing or rales.  Musculoskeletal:     Cervical back: Neck supple. No tenderness.     Right lower leg: No edema.     Left lower leg: No edema.  Skin:    General: Skin is warm.     Findings: No rash.     Comments: Pressure ulcer over right heel area, dressing in place  Neurological:     General: No focal deficit present.     Mental Status: She is alert and oriented to person, place, and time.     Sensory: No sensory deficit.     Motor: Weakness (B/l LE - 3/5) present.  Psychiatric:        Mood and Affect: Mood normal.        Behavior: Behavior normal.     BP 96/63   Pulse 82   Ht 5\' 4"  (1.626 m)   Wt 124 lb (56.2 kg)   SpO2 97%   BMI 21.28 kg/m  Wt Readings from Last 3 Encounters:  08/16/23 124 lb (56.2 kg)  07/26/23 124 lb 6.4 oz (56.4 kg)  07/05/23 128 lb (58.1 kg)    Lab Results  Component Value Date   TSH 1.480 12/12/2021   Lab Results  Component Value Date   WBC 9.8 05/19/2023   HGB 11.2 (L) 05/19/2023   HCT 33.0 (L) 05/19/2023   MCV 91.4 05/19/2023   PLT 174 05/19/2023   Lab Results  Component Value Date   NA 137 05/18/2023   K 3.7 05/18/2023   CO2 21 (L) 05/18/2023   GLUCOSE 117 (H) 05/18/2023   BUN 23 05/18/2023   CREATININE 0.92 05/18/2023   BILITOT 0.8 01/23/2023   ALKPHOS 65 01/23/2023   AST 36 01/23/2023   ALT 24 01/23/2023   PROT 7.6 01/23/2023   ALBUMIN 2.6 (L) 05/21/2023   CALCIUM  9.1 05/21/2023   ANIONGAP 5 05/18/2023   EGFR 44 (L) 11/29/2022   Lab Results  Component Value Date   CHOL 212 (H) 12/12/2021   Lab Results  Component Value Date   HDL 39 (L) 12/12/2021    Lab Results  Component Value Date   LDLCALC 136 (H) 12/12/2021   Lab Results  Component Value Date   TRIG 203 (H) 12/12/2021   Lab Results  Component Value Date   CHOLHDL 5.4 (H) 12/12/2021   Lab Results  Component Value Date   HGBA1C 5.9 (H) 05/17/2023      Assessment & Plan:   Problem List Items Addressed This Visit       Cardiovascular and Mediastinum   Atrial fibrillation (HCC) (Chronic)   CHADS-VASc - 4.  On Eliquis  for [facility] On metoprolol  100 mg BID for rate control Followed by cardiology      Relevant Medications   apixaban  (ELIQUIS ) 5 MG TABS tablet  atorvastatin  (LIPITOR) 10 MG tablet   furosemide  (LASIX ) 40 MG tablet   metoprolol  succinate (TOPROL -XL) 100 MG 24 hr tablet   Other Relevant Orders   CBC with Differential/Platelet   TSH + free T4   Chronic diastolic heart failure (HCC) (Chronic)   Appears euvolemic currently Has chronic leg swelling usually, takes furosemide  20 mg every other day On Jardiance  and metoprolol  currently Has low normal BP today, advised to discontinue losartan       Relevant Medications   apixaban  (ELIQUIS ) 5 MG TABS tablet   atorvastatin  (LIPITOR) 10 MG tablet   empagliflozin  (JARDIANCE ) 10 MG TABS tablet   furosemide  (LASIX ) 40 MG tablet   metoprolol  succinate (TOPROL -XL) 100 MG 24 hr tablet   Essential hypertension   BP Readings from Last 1 Encounters:  08/16/23 96/63   Well-controlled with metoprolol  100 mg BID DC Losartan  due to low normal BP and fatigue Recently discontinued amlodipine , HCTZ due to hypotension and AKI Counseled for compliance with the medications Advised DASH diet and moderate exercise/walking as tolerated      Relevant Medications   apixaban  (ELIQUIS ) 5 MG TABS tablet   atorvastatin  (LIPITOR) 10 MG tablet   furosemide  (LASIX ) 40 MG tablet   metoprolol  succinate (TOPROL -XL) 100 MG 24 hr tablet   Other Relevant Orders   TSH + free T4     Endocrine   Type 2 diabetes mellitus with  diabetic neuropathy, unspecified (HCC) - Primary (Chronic)   Lab Results  Component Value Date   HGBA1C 5.9 (H) 05/17/2023   Well-controlled Associated with HTN and HLD On Metformin  500 mg QD and Rybelsus  7 mg once daily, changed to metformin  XR as she has chronic diarrhea, could be due to regular metformin  On Jardiance  10 mg QD for cardiomyopathy Advised to follow diabetic diet On statin F/u CMP, HbA1c and lipid panel Diabetic eye exam: Advised to follow up with Ophthalmology for diabetic eye exam  Mild numbness of LE likely due to diabetic neuropathy, avoid adding gabapentin due to sedative effect for now      Relevant Medications   Accu-Chek Softclix Lancets lancets   atorvastatin  (LIPITOR) 10 MG tablet   empagliflozin  (JARDIANCE ) 10 MG TABS tablet   Semaglutide  (RYBELSUS ) 7 MG TABS   metFORMIN  (GLUCOPHAGE -XR) 500 MG 24 hr tablet   Other Relevant Orders   CMP14+EGFR   Hemoglobin A1c     Other   HLD (hyperlipidemia) (Chronic)   On Lipitor 10 mg once daily Check lipid profile      Relevant Medications   apixaban  (ELIQUIS ) 5 MG TABS tablet   atorvastatin  (LIPITOR) 10 MG tablet   furosemide  (LASIX ) 40 MG tablet   metoprolol  succinate (TOPROL -XL) 100 MG 24 hr tablet   Breast cancer of upper-outer quadrant of left female breast (HCC)   Has lost follow up with Oncology, advised to schedule appointment S/p mastectomy and chemotherapy On Anastrazole since 11/19      Relevant Medications   anastrozole  (ARIMIDEX ) 1 MG tablet   ondansetron  (ZOFRAN ) 4 MG tablet   Nausea   Likely due to poor p.o. intake and from Rybelsus  Needs to maintenance small, frequent meals Zofran  as needed for nausea      Relevant Medications   ondansetron  (ZOFRAN ) 4 MG tablet   Skin ulcer of right heel with fat layer exposed (HCC)   Right heel pressure ulcer healing well now Has dressing in place Followed [facility] wound clinic - planned to get US  ABI Tylenol  for mild to moderate pain,  tramadol for severe pain      Relevant Medications   traMADol (ULTRAM) 50 MG tablet   Other Relevant Orders   CBC with Differential/Platelet    Meds ordered this encounter  Medications   Accu-Chek Softclix Lancets lancets    Sig: Use as instructed    Dispense:  100 each    Refill:  0   anastrozole  (ARIMIDEX ) 1 MG tablet    Sig: Take 1 tablet (1 mg total) by mouth daily.    Dispense:  90 tablet    Refill:  1   apixaban  (ELIQUIS ) 5 MG TABS tablet    Sig: Take 1 tablet (5 mg total) by mouth 2 (two) times daily.    Dispense:  180 tablet    Refill:  3   atorvastatin  (LIPITOR) 10 MG tablet    Sig: Take 1 tablet (10 mg total) by mouth at bedtime.    Dispense:  90 tablet    Refill:  3   empagliflozin  (JARDIANCE ) 10 MG TABS tablet    Sig: Take 1 tablet (10 mg total) by mouth daily.    Dispense:  90 tablet    Refill:  3   furosemide  (LASIX ) 40 MG tablet    Sig: Take 0.5 tablets (20 mg total) by mouth daily as needed (swelling).    Dispense:  30 tablet    Refill:  3   metoprolol  succinate (TOPROL -XL) 100 MG 24 hr tablet    Sig: Take 1 tablet (100 mg total) by mouth 2 (two) times daily. Take with or immediately following a meal.    Dispense:  180 tablet    Refill:  3   ondansetron  (ZOFRAN ) 4 MG tablet    Sig: Take 1 tablet (4 mg total) by mouth every 8 (eight) hours as needed for nausea or vomiting.    Dispense:  20 tablet    Refill:  0   Semaglutide  (RYBELSUS ) 7 MG TABS    Sig: Take 1 tablet (7 mg total) by mouth daily.    Dispense:  90 tablet    Refill:  1   metFORMIN  (GLUCOPHAGE -XR) 500 MG 24 hr tablet    Sig: Take 1 tablet (500 mg total) by mouth daily with breakfast.    Dispense:  90 tablet    Refill:  3   traMADol (ULTRAM) 50 MG tablet    Sig: Take 1 tablet (50 mg total) by mouth every 12 (twelve) hours as needed.    Dispense:  30 tablet    Refill:  1    Follow-up: Return in about 3 months (around 11/16/2023) for DM and HTN.    Meldon Sport, MD

## 2023-08-16 NOTE — Assessment & Plan Note (Signed)
 Lab Results  Component Value Date   HGBA1C 5.9 (H) 05/17/2023   Well-controlled Associated with HTN and HLD On Metformin  500 mg QD and Rybelsus  7 mg once daily, changed to metformin  XR as she has chronic diarrhea, could be due to regular metformin  On Jardiance  10 mg QD for cardiomyopathy Advised to follow diabetic diet On statin F/u CMP, HbA1c and lipid panel Diabetic eye exam: Advised to follow up with Ophthalmology for diabetic eye exam  Mild numbness of LE likely due to diabetic neuropathy, avoid adding gabapentin due to sedative effect for now

## 2023-08-16 NOTE — Patient Instructions (Addendum)
 Please schedule Medicare Annual Wellness.  Please stop Losartan  for now.  Please continue to take medications as prescribed.  Please continue to follow low carb diet and ambulate as tolerated.

## 2023-08-16 NOTE — Assessment & Plan Note (Signed)
 Appears euvolemic currently Has chronic leg swelling usually, takes furosemide  20 mg every other day On Jardiance  and metoprolol  currently Has low normal BP today, advised to discontinue losartan 

## 2023-08-16 NOTE — Assessment & Plan Note (Signed)
CHADS-VASc - 4.  On Eliquis for [facility] On metoprolol 100 mg BID for rate control Followed by cardiology

## 2023-08-17 ENCOUNTER — Ambulatory Visit: Payer: Self-pay | Admitting: Internal Medicine

## 2023-08-17 LAB — CBC WITH DIFFERENTIAL/PLATELET
Basophils Absolute: 0 10*3/uL (ref 0.0–0.2)
Basos: 1 %
EOS (ABSOLUTE): 0.3 10*3/uL (ref 0.0–0.4)
Eos: 3 %
Hematocrit: 36.2 % (ref 34.0–46.6)
Hemoglobin: 11.3 g/dL (ref 11.1–15.9)
Immature Grans (Abs): 0 10*3/uL (ref 0.0–0.1)
Immature Granulocytes: 0 %
Lymphocytes Absolute: 3 10*3/uL (ref 0.7–3.1)
Lymphs: 38 %
MCH: 30.5 pg (ref 26.6–33.0)
MCHC: 31.2 g/dL — ABNORMAL LOW (ref 31.5–35.7)
MCV: 98 fL — ABNORMAL HIGH (ref 79–97)
Monocytes Absolute: 0.3 10*3/uL (ref 0.1–0.9)
Monocytes: 4 %
Neutrophils Absolute: 4.2 10*3/uL (ref 1.4–7.0)
Neutrophils: 54 %
Platelets: 253 10*3/uL (ref 150–450)
RBC: 3.7 x10E6/uL — ABNORMAL LOW (ref 3.77–5.28)
RDW: 13.5 % (ref 11.7–15.4)
WBC: 7.9 10*3/uL (ref 3.4–10.8)

## 2023-08-17 LAB — CMP14+EGFR
ALT: 26 IU/L (ref 0–32)
AST: 42 IU/L — ABNORMAL HIGH (ref 0–40)
Albumin: 3.4 g/dL — ABNORMAL LOW (ref 3.9–4.9)
Alkaline Phosphatase: 122 IU/L — ABNORMAL HIGH (ref 44–121)
BUN/Creatinine Ratio: 13 (ref 12–28)
BUN: 15 mg/dL (ref 8–27)
Bilirubin Total: 0.3 mg/dL (ref 0.0–1.2)
CO2: 23 mmol/L (ref 20–29)
Calcium: 9.7 mg/dL (ref 8.7–10.3)
Chloride: 105 mmol/L (ref 96–106)
Creatinine, Ser: 1.13 mg/dL — ABNORMAL HIGH (ref 0.57–1.00)
Globulin, Total: 3.5 g/dL (ref 1.5–4.5)
Glucose: 118 mg/dL — ABNORMAL HIGH (ref 70–99)
Potassium: 4.4 mmol/L (ref 3.5–5.2)
Sodium: 142 mmol/L (ref 134–144)
Total Protein: 6.9 g/dL (ref 6.0–8.5)
eGFR: 53 mL/min/{1.73_m2} — ABNORMAL LOW (ref >59)

## 2023-08-17 LAB — TSH+FREE T4
Free T4: 0.92 ng/dL (ref 0.82–1.77)
TSH: 1.25 u[IU]/mL (ref 0.450–4.500)

## 2023-08-17 LAB — HEMOGLOBIN A1C
Est. average glucose Bld gHb Est-mCnc: 108 mg/dL
Hgb A1c MFr Bld: 5.4 % (ref 4.8–5.6)

## 2023-08-21 ENCOUNTER — Telehealth: Payer: Self-pay | Admitting: Internal Medicine

## 2023-08-21 DIAGNOSIS — E1140 Type 2 diabetes mellitus with diabetic neuropathy, unspecified: Secondary | ICD-10-CM | POA: Diagnosis not present

## 2023-08-21 DIAGNOSIS — S52502D Unspecified fracture of the lower end of left radius, subsequent encounter for closed fracture with routine healing: Secondary | ICD-10-CM | POA: Diagnosis not present

## 2023-08-21 DIAGNOSIS — E440 Moderate protein-calorie malnutrition: Secondary | ICD-10-CM | POA: Diagnosis not present

## 2023-08-21 DIAGNOSIS — Z9181 History of falling: Secondary | ICD-10-CM | POA: Diagnosis not present

## 2023-08-21 DIAGNOSIS — I5032 Chronic diastolic (congestive) heart failure: Secondary | ICD-10-CM | POA: Diagnosis not present

## 2023-08-21 DIAGNOSIS — N189 Chronic kidney disease, unspecified: Secondary | ICD-10-CM | POA: Diagnosis not present

## 2023-08-21 DIAGNOSIS — I130 Hypertensive heart and chronic kidney disease with heart failure and stage 1 through stage 4 chronic kidney disease, or unspecified chronic kidney disease: Secondary | ICD-10-CM | POA: Diagnosis not present

## 2023-08-21 DIAGNOSIS — K3184 Gastroparesis: Secondary | ICD-10-CM | POA: Diagnosis not present

## 2023-08-21 DIAGNOSIS — E1143 Type 2 diabetes mellitus with diabetic autonomic (poly)neuropathy: Secondary | ICD-10-CM | POA: Diagnosis not present

## 2023-08-21 DIAGNOSIS — E785 Hyperlipidemia, unspecified: Secondary | ICD-10-CM | POA: Diagnosis not present

## 2023-08-21 DIAGNOSIS — G4733 Obstructive sleep apnea (adult) (pediatric): Secondary | ICD-10-CM | POA: Diagnosis not present

## 2023-08-21 DIAGNOSIS — D509 Iron deficiency anemia, unspecified: Secondary | ICD-10-CM | POA: Diagnosis not present

## 2023-08-21 DIAGNOSIS — Z7901 Long term (current) use of anticoagulants: Secondary | ICD-10-CM | POA: Diagnosis not present

## 2023-08-21 DIAGNOSIS — D631 Anemia in chronic kidney disease: Secondary | ICD-10-CM | POA: Diagnosis not present

## 2023-08-21 DIAGNOSIS — E1122 Type 2 diabetes mellitus with diabetic chronic kidney disease: Secondary | ICD-10-CM | POA: Diagnosis not present

## 2023-08-21 DIAGNOSIS — I480 Paroxysmal atrial fibrillation: Secondary | ICD-10-CM | POA: Diagnosis not present

## 2023-08-21 DIAGNOSIS — C50912 Malignant neoplasm of unspecified site of left female breast: Secondary | ICD-10-CM | POA: Diagnosis not present

## 2023-08-21 DIAGNOSIS — S72002D Fracture of unspecified part of neck of left femur, subsequent encounter for closed fracture with routine healing: Secondary | ICD-10-CM | POA: Diagnosis not present

## 2023-08-21 DIAGNOSIS — I429 Cardiomyopathy, unspecified: Secondary | ICD-10-CM | POA: Diagnosis not present

## 2023-08-21 DIAGNOSIS — Z7984 Long term (current) use of oral hypoglycemic drugs: Secondary | ICD-10-CM | POA: Diagnosis not present

## 2023-08-21 NOTE — Telephone Encounter (Signed)
 FYI   Copied from CRM [id number]. Topic: General - Other >> Aug 21, 2023 10:21 AM Felizardo Hotter wrote: Reason for CRM: Received call from [facility] per Vivar Noelia ph: [phone] informing clinic that pt fell on Saturday Aug 18, 2023. Pt was taken to urgent care by family.

## 2023-08-22 ENCOUNTER — Encounter (HOSPITAL_COMMUNITY): Payer: Self-pay

## 2023-08-22 ENCOUNTER — Emergency Department (HOSPITAL_COMMUNITY)

## 2023-08-22 ENCOUNTER — Emergency Department (HOSPITAL_COMMUNITY)
Admission: EM | Admit: 2023-08-22 | Discharge: 2023-08-22 | Disposition: A | Discharge status: Home / Self Care | Attending: Emergency Medicine | Admitting: Emergency Medicine

## 2023-08-22 ENCOUNTER — Inpatient Hospital Stay
Admission: RE | Admit: 2023-08-22 | Discharge: 2023-08-22 | Disposition: A | Discharge status: Home / Self Care | Source: Ambulatory Visit | Attending: Internal Medicine | Admitting: Internal Medicine

## 2023-08-22 ENCOUNTER — Other Ambulatory Visit: Payer: Self-pay

## 2023-08-22 DIAGNOSIS — L97412 Non-pressure chronic ulcer of right heel and midfoot with fat layer exposed: Secondary | ICD-10-CM | POA: Diagnosis not present

## 2023-08-22 DIAGNOSIS — S0990XA Unspecified injury of head, initial encounter: Secondary | ICD-10-CM | POA: Diagnosis not present

## 2023-08-22 DIAGNOSIS — I6782 Cerebral ischemia: Secondary | ICD-10-CM | POA: Diagnosis not present

## 2023-08-22 DIAGNOSIS — W01198A Fall on same level from slipping, tripping and stumbling with subsequent striking against other object, initial encounter: Secondary | ICD-10-CM | POA: Diagnosis not present

## 2023-08-22 DIAGNOSIS — Z8673 Personal history of transient ischemic attack (TIA), and cerebral infarction without residual deficits: Secondary | ICD-10-CM | POA: Insufficient documentation

## 2023-08-22 DIAGNOSIS — Z7901 Long term (current) use of anticoagulants: Secondary | ICD-10-CM | POA: Insufficient documentation

## 2023-08-22 DIAGNOSIS — M79671 Pain in right foot: Secondary | ICD-10-CM | POA: Diagnosis not present

## 2023-08-22 DIAGNOSIS — E11621 Type 2 diabetes mellitus with foot ulcer: Secondary | ICD-10-CM | POA: Diagnosis not present

## 2023-08-22 DIAGNOSIS — M25552 Pain in left hip: Secondary | ICD-10-CM | POA: Insufficient documentation

## 2023-08-22 DIAGNOSIS — W19XXXA Unspecified fall, initial encounter: Secondary | ICD-10-CM

## 2023-08-22 DIAGNOSIS — S72002A Fracture of unspecified part of neck of left femur, initial encounter for closed fracture: Secondary | ICD-10-CM | POA: Diagnosis not present

## 2023-08-22 DIAGNOSIS — Z7984 Long term (current) use of oral hypoglycemic drugs: Secondary | ICD-10-CM | POA: Diagnosis not present

## 2023-08-22 DIAGNOSIS — I110 Hypertensive heart disease with heart failure: Secondary | ICD-10-CM | POA: Diagnosis not present

## 2023-08-22 DIAGNOSIS — I4891 Unspecified atrial fibrillation: Secondary | ICD-10-CM | POA: Diagnosis not present

## 2023-08-22 DIAGNOSIS — I509 Heart failure, unspecified: Secondary | ICD-10-CM | POA: Diagnosis not present

## 2023-08-22 NOTE — Discharge Instructions (Signed)
 Your x-rays look good, there is no new broken bones, no signs of bleeding on the brain, you are free to follow-up with your family doctor in the outpatient setting, ER for worsening symptoms

## 2023-08-22 NOTE — ED Triage Notes (Signed)
 Pt arrived via POV c/o a fall Saturday night. Pt presents with wound to right foot. Pt reports home health nurse advised the Pt be seen here for evaluation. Pt reports hitting her head and is on a blood thinner.

## 2023-08-22 NOTE — ED Provider Notes (Signed)
 [place] EMERGENCY DEPARTMENT AT [facility] Provider Note   CSN: [id number] Arrival date & time: 08/22/23  1102     History  Chief Complaint  Patient presents with   Lisa Thornton is a 68 y.o. female.   Fall  This patient is a 68 year old female, has a sore on her right foot which is actively being cared for by wound care team, she has been ambulating with a walker minimally and doing physical therapy at home, had a fall 2 nights ago where she fell and bumped her head, she was advised by her physical therapist and her medical team to be evaluated for the head injury.  She was redirected here from urgent care because she takes Eliquis .  The patient has no complaints including headache leg pain neck pain chest pain shortness of breath or any other complaints.     Home Medications Prior to Admission medications   Medication Sig Start Date End Date Taking? Authorizing Provider  Accu-Chek Softclix Lancets lancets Use as instructed 08/16/23   Meldon Sport, MD  anastrozole  (ARIMIDEX ) 1 MG tablet Take 1 tablet (1 mg total) by mouth daily. 08/16/23   Meldon Sport, MD  apixaban  (ELIQUIS ) 5 MG TABS tablet Take 1 tablet (5 mg total) by mouth 2 (two) times daily. 08/16/23   Meldon Sport, MD  atorvastatin  (LIPITOR) 10 MG tablet Take 1 tablet (10 mg total) by mouth at bedtime. 08/16/23   Meldon Sport, MD  blood glucose meter kit and supplies Dispense based on patient and insurance preference. Use up to four times daily as directed. (FOR ICD-10 E10.9, E11.9). 10/29/19   Lanetta Pion, NP  cetirizine (ZYRTEC) 10 MG tablet Take 10 mg by mouth daily.    [provider]  chlorhexidine  (HIBICLENS ) 4 % external liquid Apply 15 mLs (1 Application total) topically as directed for 30 doses. Use as directed daily for 5 days every other week for 6 weeks. 05/17/23   Gawne, Meghan M, PA-C  Cholecalciferol  (VITAMIN D -3 PO) Take 1 tablet by mouth daily.    [provider]  empagliflozin  (JARDIANCE ) 10 MG TABS tablet Take 1 tablet (10 mg total) by mouth daily. 08/16/23   Meldon Sport, MD  feeding supplement (ENSURE ENLIVE / ENSURE PLUS) LIQD Take 237 mLs by mouth 2 (two) times daily between meals. 05/21/23   Verlyn Goad, MD  furosemide  (LASIX ) 40 MG tablet Take 0.5 tablets (20 mg total) by mouth daily as needed (swelling). 08/16/23   Meldon Sport, MD  glucose blood (ACCU-CHEK GUIDE) test strip USE 1 STRIP TO CHECK GLUCOSE THREE TIMES DAILY MORNING,  NOON  AND  AT  BEDTIME  AS  DIRECTED 01/02/23   Meldon Sport, MD  lactobacillus (FLORANEX/LACTINEX) PACK Take 1 packet by mouth 3 (three) times daily with meals.    [provider]  magnesium  oxide (MAG-OX) 400 (240 Mg) MG tablet Take 1 tablet (400 mg total) by mouth daily. Patient taking differently: Take 400 mg by mouth at bedtime. 07/10/22   Demaris Fillers, MD  metFORMIN  (GLUCOPHAGE -XR) 500 MG 24 hr tablet Take 1 tablet (500 mg total) by mouth daily with breakfast. 08/16/23   Meldon Sport, MD  metoprolol  succinate (TOPROL -XL) 100 MG 24 hr tablet Take 1 tablet (100 mg total) by mouth 2 (two) times daily. Take with or immediately following a meal. 08/16/23 11/14/23  Meldon Sport, MD  mirtazapine  (REMERON ) 7.5 MG tablet  Take 1 tablet (7.5 mg total) by mouth at bedtime. 07/10/23   Meldon Sport, MD  mupirocin  ointment (BACTROBAN ) 2 % Apply 1 Application topically 2 (two) times daily. 06/28/23   [provider]  ondansetron  (ZOFRAN ) 4 MG tablet Take 1 tablet (4 mg total) by mouth every 8 (eight) hours as needed for nausea or vomiting. 08/16/23   Meldon Sport, MD  OVER THE COUNTER MEDICATION Take 1 tablet by mouth at bedtime. OTC "stomach pill"    [provider]  Semaglutide  (RYBELSUS ) 7 MG TABS Take 1 tablet (7 mg total) by mouth daily. 08/16/23   Meldon Sport, MD  traMADol  (ULTRAM ) 50 MG tablet Take 1 tablet (50 mg total) by mouth every 12 (twelve) hours as needed. 08/16/23    Meldon Sport, MD      Allergies    Patient has no known allergies.    Review of Systems   Review of Systems  All other systems reviewed and are negative.   Physical Exam Updated Vital Signs BP 110/64 (BP Location: Left Arm)   Pulse 88   Temp 98 F (36.7 C) (Temporal)   Resp 16   Ht 1.626 m (5\' 4" )   Wt 56.2 kg   SpO2 96%   BMI 21.28 kg/m  Physical Exam Vitals and nursing note reviewed.  Constitutional:      General: She is not in acute distress.    Appearance: She is well-developed.  HENT:     Head: Normocephalic and atraumatic.     Mouth/Throat:     Pharynx: No oropharyngeal exudate.  Eyes:     General: No scleral icterus.       Right eye: No discharge.        Left eye: No discharge.     Conjunctiva/sclera: Conjunctivae normal.     Pupils: Pupils are equal, round, and reactive to light.  Neck:     Thyroid : No thyromegaly.     Vascular: No JVD.  Cardiovascular:     Rate and Rhythm: Normal rate and regular rhythm.     Heart sounds: Normal heart sounds. No murmur heard.    No friction rub. No gallop.  Pulmonary:     Effort: Pulmonary effort is normal. No respiratory distress.     Breath sounds: Normal breath sounds. No wheezing or rales.  Abdominal:     General: Bowel sounds are normal. There is no distension.     Palpations: Abdomen is soft. There is no mass.     Tenderness: There is no abdominal tenderness.  Musculoskeletal:        General: No tenderness. Normal range of motion.     Cervical back: Normal range of motion and neck supple.     Right lower leg: No edema.     Left lower leg: No edema.     Comments: The patient moves her bilateral lower extremities to full range of motion except for the right ankle which is immobilized in a walking boot.  Left lower extremity is able to be flexed and manipulated about the hip the knee and the ankle without significant pain tenderness and there is no leg length discrepancies.  Bilateral upper extremities with good  range of motion.  Lymphadenopathy:     Cervical: No cervical adenopathy.  Skin:    General: Skin is warm and dry.     Findings: No erythema or rash.  Neurological:     Mental Status: She is alert.  Coordination: Coordination normal.  Psychiatric:        Behavior: Behavior normal.     ED Results / Procedures / Treatments   Labs (all labs ordered are listed, but only abnormal results are displayed) Labs Reviewed - No data to display  EKG None  Radiology DG Hip Unilat W or Wo Pelvis 2-3 Views Left Result Date: 08/22/2023 CLINICAL DATA:  trauma, fall, left sided pelvic / hip pain EXAM: DG HIP (WITH OR WITHOUT PELVIS) 2-3V LEFT COMPARISON:  May 16, 2023, May 17, 2023 FINDINGS: Redemonstrated, impacted, subcapital left femoral neck fracture status post cannulated lag screw fixation. No evidence of hardware fracture or loosening. No acute fracture or dislocation. There is no evidence of arthropathy or other focal bone abnormality. Soft tissues are unremarkable. IMPRESSION: Redemonstrated subcapital left femoral neck fracture status post lag screw fixation. No evidence of hardware fracture or loosening. No acute fracture or dislocation. Electronically Signed   By: Rance Burrows M.D.   On: 08/22/2023 16:57   CT Head Wo Contrast Result Date: 08/22/2023 CLINICAL DATA:  Head trauma, minor, normal mental status (Age 28-64y). Recent fall. On blood thinner. EXAM: CT HEAD WITHOUT CONTRAST TECHNIQUE: Contiguous axial images were obtained from the base of the skull through the vertex without intravenous contrast. RADIATION DOSE REDUCTION: This exam was performed according to the departmental dose-optimization program which includes automated exposure control, adjustment of the mA and/or kV according to patient size and/or use of iterative reconstruction technique. COMPARISON:  Head CT 07/03/2022 FINDINGS: Brain: There is no evidence of an acute infarct, intracranial hemorrhage, mass, midline  shift, or extra-axial fluid collection. A chronic left occipital infarct is unchanged. Patchy hypodensities elsewhere in the cerebral white matter bilaterally are also similar to the prior study and are nonspecific but compatible with mild chronic small vessel ischemic disease. There is mild cerebral atrophy. Vascular: Calcified atherosclerosis at the skull base. No hyperdense vessel. Skull: No acute fracture or suspicious lesion. Sinuses/Orbits: Partially visualized mucosal thickening and fluid/secretions in the maxillary sinuses. Clear mastoid air cells. Unremarkable orbits. Other: None. IMPRESSION: 1. No evidence of acute intracranial abnormality. 2. Chronic ischemia including an old left occipital infarct. Electronically Signed   By: Aundra Lee M.D.   On: 08/22/2023 14:27    Procedures Procedures    Medications Ordered in ED Medications - No data to display  ED Course/ Medical Decision Making/ A&P                                 Medical Decision Making Amount and/or Complexity of Data Reviewed Radiology: ordered.   Head inspected, no signs of hematoma or obvious trauma, vitals normal, left lower extremity unremarkable, she has had a prior fracture of her left hip prompting the evaluation since she fell on her left side.  The patient is comfortable with the x-rays, I suspect she will be able to be discharged, I discussed the plan with the patient and her family member and they are in total agreement.  Imaging: I personally viewed and interpreted the x-rays of the brain and the hip, there does not appear to be any signs of new fracture of the hip or pelvis, there is no bleeding on the brain, there are screws in the previous surgical site of the left femur which are intact, no signs of loosening  I have discussed with the patient at the bedside the results, and the meaning of these results.  They have had opportunity to ask questions,  expressed their understanding to the need for follow-up  with primary care physician        Final Clinical Impression(s) / ED Diagnoses Final diagnoses:  Fall, initial encounter    Rx / DC Orders ED Discharge Orders     None         Early Glisson, MD 08/22/23 1706

## 2023-08-22 NOTE — ED Notes (Signed)
 Pt reports she ambulates at home with a walker, and was clumsy and fell.

## 2023-08-23 ENCOUNTER — Telehealth: Payer: Self-pay | Admitting: Internal Medicine

## 2023-08-23 DIAGNOSIS — H524 Presbyopia: Secondary | ICD-10-CM | POA: Diagnosis not present

## 2023-08-23 NOTE — Telephone Encounter (Unsigned)
 Copied from CRM [id number]. Topic: General - Other >> Aug 23, 2023 10:13 AM Sophia H wrote: Reason for CRM: Pt niece is calling in regarding [employer] Forms she never heard back on. Just needs to know if they were filled and faxed over, please advise Anola Basques [phone]

## 2023-08-23 NOTE — Telephone Encounter (Signed)
 Spoke with niece confirmed fax was sent on 05/06, advised if additional information is needed to have them fax it over, verbalized understanding.

## 2023-08-24 ENCOUNTER — Telehealth: Payer: Self-pay | Admitting: Internal Medicine

## 2023-08-24 DIAGNOSIS — I5032 Chronic diastolic (congestive) heart failure: Secondary | ICD-10-CM | POA: Diagnosis not present

## 2023-08-24 DIAGNOSIS — I130 Hypertensive heart and chronic kidney disease with heart failure and stage 1 through stage 4 chronic kidney disease, or unspecified chronic kidney disease: Secondary | ICD-10-CM | POA: Diagnosis not present

## 2023-08-24 DIAGNOSIS — Z9181 History of falling: Secondary | ICD-10-CM | POA: Diagnosis not present

## 2023-08-24 DIAGNOSIS — E785 Hyperlipidemia, unspecified: Secondary | ICD-10-CM | POA: Diagnosis not present

## 2023-08-24 DIAGNOSIS — D631 Anemia in chronic kidney disease: Secondary | ICD-10-CM | POA: Diagnosis not present

## 2023-08-24 DIAGNOSIS — Z7984 Long term (current) use of oral hypoglycemic drugs: Secondary | ICD-10-CM | POA: Diagnosis not present

## 2023-08-24 DIAGNOSIS — E1122 Type 2 diabetes mellitus with diabetic chronic kidney disease: Secondary | ICD-10-CM | POA: Diagnosis not present

## 2023-08-24 DIAGNOSIS — S72002D Fracture of unspecified part of neck of left femur, subsequent encounter for closed fracture with routine healing: Secondary | ICD-10-CM | POA: Diagnosis not present

## 2023-08-24 DIAGNOSIS — E1143 Type 2 diabetes mellitus with diabetic autonomic (poly)neuropathy: Secondary | ICD-10-CM | POA: Diagnosis not present

## 2023-08-24 DIAGNOSIS — S52502D Unspecified fracture of the lower end of left radius, subsequent encounter for closed fracture with routine healing: Secondary | ICD-10-CM | POA: Diagnosis not present

## 2023-08-24 DIAGNOSIS — I480 Paroxysmal atrial fibrillation: Secondary | ICD-10-CM | POA: Diagnosis not present

## 2023-08-24 DIAGNOSIS — C50912 Malignant neoplasm of unspecified site of left female breast: Secondary | ICD-10-CM | POA: Diagnosis not present

## 2023-08-24 DIAGNOSIS — I429 Cardiomyopathy, unspecified: Secondary | ICD-10-CM | POA: Diagnosis not present

## 2023-08-24 DIAGNOSIS — E1140 Type 2 diabetes mellitus with diabetic neuropathy, unspecified: Secondary | ICD-10-CM | POA: Diagnosis not present

## 2023-08-24 DIAGNOSIS — D509 Iron deficiency anemia, unspecified: Secondary | ICD-10-CM | POA: Diagnosis not present

## 2023-08-24 DIAGNOSIS — K3184 Gastroparesis: Secondary | ICD-10-CM | POA: Diagnosis not present

## 2023-08-24 DIAGNOSIS — E440 Moderate protein-calorie malnutrition: Secondary | ICD-10-CM | POA: Diagnosis not present

## 2023-08-24 DIAGNOSIS — Z7901 Long term (current) use of anticoagulants: Secondary | ICD-10-CM | POA: Diagnosis not present

## 2023-08-24 DIAGNOSIS — N189 Chronic kidney disease, unspecified: Secondary | ICD-10-CM | POA: Diagnosis not present

## 2023-08-24 DIAGNOSIS — G4733 Obstructive sleep apnea (adult) (pediatric): Secondary | ICD-10-CM | POA: Diagnosis not present

## 2023-08-24 NOTE — Telephone Encounter (Signed)
 Copied from CRM [id number]. Topic: General - Other >> Aug 23, 2023 10:13 AM Sophia H wrote: Reason for CRM: Pt niece is calling in regarding [employer] Forms she never heard back on. Just needs to know if they were filled and faxed over, please advise Anola Basques [phone]

## 2023-08-24 NOTE — Telephone Encounter (Signed)
 Copied from CRM [id number]. Topic: General - Other >> Aug 23, 2023 10:13 AM Sophia H wrote: Reason for CRM: Pt niece is calling in regarding [employer] Forms she never heard back on. Just needs to know if they were filled and faxed over, please advise Anola Basques [phone] >> Aug 24, 2023 12:57 PM Emylou G wrote: Pls call the niece back.. if still has and faxed over.Ivette Marks [phone] >> Aug 24, 2023 12:55 PM Emylou G wrote: Pls call >> Aug 24, 2023 12:54 PM Emylou G wrote: Please refax paperwork.. doctors signature was missing?  See original message

## 2023-08-29 DIAGNOSIS — E1140 Type 2 diabetes mellitus with diabetic neuropathy, unspecified: Secondary | ICD-10-CM | POA: Diagnosis not present

## 2023-08-29 DIAGNOSIS — I429 Cardiomyopathy, unspecified: Secondary | ICD-10-CM | POA: Diagnosis not present

## 2023-08-29 DIAGNOSIS — E11621 Type 2 diabetes mellitus with foot ulcer: Secondary | ICD-10-CM | POA: Diagnosis not present

## 2023-08-29 DIAGNOSIS — C50912 Malignant neoplasm of unspecified site of left female breast: Secondary | ICD-10-CM | POA: Diagnosis not present

## 2023-08-29 DIAGNOSIS — K3184 Gastroparesis: Secondary | ICD-10-CM | POA: Diagnosis not present

## 2023-08-29 DIAGNOSIS — G4733 Obstructive sleep apnea (adult) (pediatric): Secondary | ICD-10-CM | POA: Diagnosis not present

## 2023-08-29 DIAGNOSIS — I130 Hypertensive heart and chronic kidney disease with heart failure and stage 1 through stage 4 chronic kidney disease, or unspecified chronic kidney disease: Secondary | ICD-10-CM | POA: Diagnosis not present

## 2023-08-29 DIAGNOSIS — E785 Hyperlipidemia, unspecified: Secondary | ICD-10-CM | POA: Diagnosis not present

## 2023-08-29 DIAGNOSIS — L97412 Non-pressure chronic ulcer of right heel and midfoot with fat layer exposed: Secondary | ICD-10-CM | POA: Diagnosis not present

## 2023-08-29 DIAGNOSIS — S52502D Unspecified fracture of the lower end of left radius, subsequent encounter for closed fracture with routine healing: Secondary | ICD-10-CM | POA: Diagnosis not present

## 2023-08-29 DIAGNOSIS — Z7984 Long term (current) use of oral hypoglycemic drugs: Secondary | ICD-10-CM | POA: Diagnosis not present

## 2023-08-29 DIAGNOSIS — Z79899 Other long term (current) drug therapy: Secondary | ICD-10-CM | POA: Diagnosis not present

## 2023-08-29 DIAGNOSIS — I5032 Chronic diastolic (congestive) heart failure: Secondary | ICD-10-CM | POA: Diagnosis not present

## 2023-08-29 DIAGNOSIS — S72002D Fracture of unspecified part of neck of left femur, subsequent encounter for closed fracture with routine healing: Secondary | ICD-10-CM | POA: Diagnosis not present

## 2023-08-29 DIAGNOSIS — D509 Iron deficiency anemia, unspecified: Secondary | ICD-10-CM | POA: Diagnosis not present

## 2023-08-29 DIAGNOSIS — Z9181 History of falling: Secondary | ICD-10-CM | POA: Diagnosis not present

## 2023-08-29 DIAGNOSIS — Z7901 Long term (current) use of anticoagulants: Secondary | ICD-10-CM | POA: Diagnosis not present

## 2023-08-29 DIAGNOSIS — E1122 Type 2 diabetes mellitus with diabetic chronic kidney disease: Secondary | ICD-10-CM | POA: Diagnosis not present

## 2023-08-29 DIAGNOSIS — I10 Essential (primary) hypertension: Secondary | ICD-10-CM | POA: Diagnosis not present

## 2023-08-29 DIAGNOSIS — Z9012 Acquired absence of left breast and nipple: Secondary | ICD-10-CM | POA: Diagnosis not present

## 2023-08-29 DIAGNOSIS — N189 Chronic kidney disease, unspecified: Secondary | ICD-10-CM | POA: Diagnosis not present

## 2023-08-29 DIAGNOSIS — E1143 Type 2 diabetes mellitus with diabetic autonomic (poly)neuropathy: Secondary | ICD-10-CM | POA: Diagnosis not present

## 2023-08-29 DIAGNOSIS — D631 Anemia in chronic kidney disease: Secondary | ICD-10-CM | POA: Diagnosis not present

## 2023-08-29 DIAGNOSIS — I480 Paroxysmal atrial fibrillation: Secondary | ICD-10-CM | POA: Diagnosis not present

## 2023-08-29 DIAGNOSIS — E440 Moderate protein-calorie malnutrition: Secondary | ICD-10-CM | POA: Diagnosis not present

## 2023-08-30 DIAGNOSIS — K3184 Gastroparesis: Secondary | ICD-10-CM | POA: Diagnosis not present

## 2023-08-30 DIAGNOSIS — E785 Hyperlipidemia, unspecified: Secondary | ICD-10-CM | POA: Diagnosis not present

## 2023-08-30 DIAGNOSIS — C50912 Malignant neoplasm of unspecified site of left female breast: Secondary | ICD-10-CM | POA: Diagnosis not present

## 2023-08-30 DIAGNOSIS — I5032 Chronic diastolic (congestive) heart failure: Secondary | ICD-10-CM | POA: Diagnosis not present

## 2023-08-30 DIAGNOSIS — I429 Cardiomyopathy, unspecified: Secondary | ICD-10-CM | POA: Diagnosis not present

## 2023-08-30 DIAGNOSIS — Z7901 Long term (current) use of anticoagulants: Secondary | ICD-10-CM | POA: Diagnosis not present

## 2023-08-30 DIAGNOSIS — N189 Chronic kidney disease, unspecified: Secondary | ICD-10-CM | POA: Diagnosis not present

## 2023-08-30 DIAGNOSIS — I130 Hypertensive heart and chronic kidney disease with heart failure and stage 1 through stage 4 chronic kidney disease, or unspecified chronic kidney disease: Secondary | ICD-10-CM | POA: Diagnosis not present

## 2023-08-30 DIAGNOSIS — E1140 Type 2 diabetes mellitus with diabetic neuropathy, unspecified: Secondary | ICD-10-CM | POA: Diagnosis not present

## 2023-08-30 DIAGNOSIS — G4733 Obstructive sleep apnea (adult) (pediatric): Secondary | ICD-10-CM | POA: Diagnosis not present

## 2023-08-30 DIAGNOSIS — S72002D Fracture of unspecified part of neck of left femur, subsequent encounter for closed fracture with routine healing: Secondary | ICD-10-CM | POA: Diagnosis not present

## 2023-08-30 DIAGNOSIS — Z7984 Long term (current) use of oral hypoglycemic drugs: Secondary | ICD-10-CM | POA: Diagnosis not present

## 2023-08-30 DIAGNOSIS — D631 Anemia in chronic kidney disease: Secondary | ICD-10-CM | POA: Diagnosis not present

## 2023-08-30 DIAGNOSIS — D509 Iron deficiency anemia, unspecified: Secondary | ICD-10-CM | POA: Diagnosis not present

## 2023-08-30 DIAGNOSIS — I480 Paroxysmal atrial fibrillation: Secondary | ICD-10-CM | POA: Diagnosis not present

## 2023-08-30 DIAGNOSIS — S52502D Unspecified fracture of the lower end of left radius, subsequent encounter for closed fracture with routine healing: Secondary | ICD-10-CM | POA: Diagnosis not present

## 2023-08-30 DIAGNOSIS — E440 Moderate protein-calorie malnutrition: Secondary | ICD-10-CM | POA: Diagnosis not present

## 2023-08-30 DIAGNOSIS — E1143 Type 2 diabetes mellitus with diabetic autonomic (poly)neuropathy: Secondary | ICD-10-CM | POA: Diagnosis not present

## 2023-08-30 DIAGNOSIS — E1122 Type 2 diabetes mellitus with diabetic chronic kidney disease: Secondary | ICD-10-CM | POA: Diagnosis not present

## 2023-08-30 DIAGNOSIS — Z9181 History of falling: Secondary | ICD-10-CM | POA: Diagnosis not present

## 2023-08-31 DIAGNOSIS — L97412 Non-pressure chronic ulcer of right heel and midfoot with fat layer exposed: Secondary | ICD-10-CM | POA: Diagnosis not present

## 2023-09-03 ENCOUNTER — Telehealth: Payer: Self-pay | Admitting: Internal Medicine

## 2023-09-03 DIAGNOSIS — E1122 Type 2 diabetes mellitus with diabetic chronic kidney disease: Secondary | ICD-10-CM | POA: Diagnosis not present

## 2023-09-03 DIAGNOSIS — L89613 Pressure ulcer of right heel, stage 3: Secondary | ICD-10-CM | POA: Diagnosis not present

## 2023-09-03 DIAGNOSIS — D631 Anemia in chronic kidney disease: Secondary | ICD-10-CM | POA: Diagnosis not present

## 2023-09-03 DIAGNOSIS — Z7984 Long term (current) use of oral hypoglycemic drugs: Secondary | ICD-10-CM | POA: Diagnosis not present

## 2023-09-03 DIAGNOSIS — S72002D Fracture of unspecified part of neck of left femur, subsequent encounter for closed fracture with routine healing: Secondary | ICD-10-CM | POA: Diagnosis not present

## 2023-09-03 DIAGNOSIS — I130 Hypertensive heart and chronic kidney disease with heart failure and stage 1 through stage 4 chronic kidney disease, or unspecified chronic kidney disease: Secondary | ICD-10-CM | POA: Diagnosis not present

## 2023-09-03 DIAGNOSIS — E785 Hyperlipidemia, unspecified: Secondary | ICD-10-CM | POA: Diagnosis not present

## 2023-09-03 DIAGNOSIS — Z7901 Long term (current) use of anticoagulants: Secondary | ICD-10-CM | POA: Diagnosis not present

## 2023-09-03 DIAGNOSIS — I5032 Chronic diastolic (congestive) heart failure: Secondary | ICD-10-CM | POA: Diagnosis not present

## 2023-09-03 DIAGNOSIS — K3184 Gastroparesis: Secondary | ICD-10-CM | POA: Diagnosis not present

## 2023-09-03 DIAGNOSIS — E1143 Type 2 diabetes mellitus with diabetic autonomic (poly)neuropathy: Secondary | ICD-10-CM | POA: Diagnosis not present

## 2023-09-03 DIAGNOSIS — E1140 Type 2 diabetes mellitus with diabetic neuropathy, unspecified: Secondary | ICD-10-CM | POA: Diagnosis not present

## 2023-09-03 DIAGNOSIS — G4733 Obstructive sleep apnea (adult) (pediatric): Secondary | ICD-10-CM | POA: Diagnosis not present

## 2023-09-03 DIAGNOSIS — I480 Paroxysmal atrial fibrillation: Secondary | ICD-10-CM | POA: Diagnosis not present

## 2023-09-03 DIAGNOSIS — D509 Iron deficiency anemia, unspecified: Secondary | ICD-10-CM | POA: Diagnosis not present

## 2023-09-03 DIAGNOSIS — N189 Chronic kidney disease, unspecified: Secondary | ICD-10-CM | POA: Diagnosis not present

## 2023-09-03 DIAGNOSIS — Z9181 History of falling: Secondary | ICD-10-CM | POA: Diagnosis not present

## 2023-09-03 DIAGNOSIS — I429 Cardiomyopathy, unspecified: Secondary | ICD-10-CM | POA: Diagnosis not present

## 2023-09-03 DIAGNOSIS — C50912 Malignant neoplasm of unspecified site of left female breast: Secondary | ICD-10-CM | POA: Diagnosis not present

## 2023-09-03 DIAGNOSIS — E440 Moderate protein-calorie malnutrition: Secondary | ICD-10-CM | POA: Diagnosis not present

## 2023-09-03 NOTE — Telephone Encounter (Unsigned)
 Copied from CRM [id number]. Topic: General - Other >> Sep 03, 2023  9:18 AM El Gravely T wrote: Reason for CRM: Patient niece, Marijo Shove (DPR verified), calling to check the status of [employer] forms, Per patient niece, states forms were received, but provider's signature was missing.   Requesting forms be re-faxed, with provider signature on All necessary forms. Please Fax forms back to indicated number listed on the forms. If forms needs to be resent please reach out to niece.  For any additional questions, Anola Basques can be reached at [phone]

## 2023-09-06 NOTE — Telephone Encounter (Signed)
 Printed and placed in Patels box to sign

## 2023-09-11 ENCOUNTER — Telehealth: Payer: Self-pay | Admitting: Pharmacist

## 2023-09-11 DIAGNOSIS — C50912 Malignant neoplasm of unspecified site of left female breast: Secondary | ICD-10-CM | POA: Diagnosis not present

## 2023-09-11 DIAGNOSIS — E1140 Type 2 diabetes mellitus with diabetic neuropathy, unspecified: Secondary | ICD-10-CM | POA: Diagnosis not present

## 2023-09-11 DIAGNOSIS — I5032 Chronic diastolic (congestive) heart failure: Secondary | ICD-10-CM | POA: Diagnosis not present

## 2023-09-11 DIAGNOSIS — N189 Chronic kidney disease, unspecified: Secondary | ICD-10-CM | POA: Diagnosis not present

## 2023-09-11 DIAGNOSIS — Z7901 Long term (current) use of anticoagulants: Secondary | ICD-10-CM | POA: Diagnosis not present

## 2023-09-11 DIAGNOSIS — Z9181 History of falling: Secondary | ICD-10-CM | POA: Diagnosis not present

## 2023-09-11 DIAGNOSIS — S72002D Fracture of unspecified part of neck of left femur, subsequent encounter for closed fracture with routine healing: Secondary | ICD-10-CM | POA: Diagnosis not present

## 2023-09-11 DIAGNOSIS — D509 Iron deficiency anemia, unspecified: Secondary | ICD-10-CM | POA: Diagnosis not present

## 2023-09-11 DIAGNOSIS — D631 Anemia in chronic kidney disease: Secondary | ICD-10-CM | POA: Diagnosis not present

## 2023-09-11 DIAGNOSIS — Z7984 Long term (current) use of oral hypoglycemic drugs: Secondary | ICD-10-CM | POA: Diagnosis not present

## 2023-09-11 DIAGNOSIS — I480 Paroxysmal atrial fibrillation: Secondary | ICD-10-CM | POA: Diagnosis not present

## 2023-09-11 DIAGNOSIS — E785 Hyperlipidemia, unspecified: Secondary | ICD-10-CM | POA: Diagnosis not present

## 2023-09-11 DIAGNOSIS — E1143 Type 2 diabetes mellitus with diabetic autonomic (poly)neuropathy: Secondary | ICD-10-CM | POA: Diagnosis not present

## 2023-09-11 DIAGNOSIS — I130 Hypertensive heart and chronic kidney disease with heart failure and stage 1 through stage 4 chronic kidney disease, or unspecified chronic kidney disease: Secondary | ICD-10-CM | POA: Diagnosis not present

## 2023-09-11 DIAGNOSIS — E440 Moderate protein-calorie malnutrition: Secondary | ICD-10-CM | POA: Diagnosis not present

## 2023-09-11 DIAGNOSIS — K3184 Gastroparesis: Secondary | ICD-10-CM | POA: Diagnosis not present

## 2023-09-11 DIAGNOSIS — I429 Cardiomyopathy, unspecified: Secondary | ICD-10-CM | POA: Diagnosis not present

## 2023-09-11 DIAGNOSIS — G4733 Obstructive sleep apnea (adult) (pediatric): Secondary | ICD-10-CM | POA: Diagnosis not present

## 2023-09-11 DIAGNOSIS — L89613 Pressure ulcer of right heel, stage 3: Secondary | ICD-10-CM | POA: Diagnosis not present

## 2023-09-11 DIAGNOSIS — E1122 Type 2 diabetes mellitus with diabetic chronic kidney disease: Secondary | ICD-10-CM | POA: Diagnosis not present

## 2023-09-11 NOTE — Telephone Encounter (Signed)
   This patient is appearing on a report for being at risk of failing the adherence measure for cholesterol (statin) and diabetes medications this calendar year.   Medication: Rybelsus , metformin , atorvastatin  Last fill date: 08/16/23 for 90 day supply  Insurance report was not up to date. No action needed at this time.    Marq Rebello Dattero Smera Guyette, PharmD, BCACP, CPP Clinical Pharmacist, [facility]

## 2023-09-13 ENCOUNTER — Other Ambulatory Visit: Payer: Self-pay

## 2023-09-13 DIAGNOSIS — M8580 Other specified disorders of bone density and structure, unspecified site: Secondary | ICD-10-CM

## 2023-09-13 DIAGNOSIS — Z170 Estrogen receptor positive status [ER+]: Secondary | ICD-10-CM

## 2023-09-14 ENCOUNTER — Inpatient Hospital Stay: Attending: Hematology

## 2023-09-14 DIAGNOSIS — Z79811 Long term (current) use of aromatase inhibitors: Secondary | ICD-10-CM | POA: Diagnosis not present

## 2023-09-14 DIAGNOSIS — C50412 Malignant neoplasm of upper-outer quadrant of left female breast: Secondary | ICD-10-CM | POA: Diagnosis not present

## 2023-09-14 DIAGNOSIS — Z9012 Acquired absence of left breast and nipple: Secondary | ICD-10-CM | POA: Insufficient documentation

## 2023-09-14 DIAGNOSIS — Z7901 Long term (current) use of anticoagulants: Secondary | ICD-10-CM | POA: Diagnosis not present

## 2023-09-14 DIAGNOSIS — Z170 Estrogen receptor positive status [ER+]: Secondary | ICD-10-CM | POA: Diagnosis not present

## 2023-09-14 DIAGNOSIS — M8580 Other specified disorders of bone density and structure, unspecified site: Secondary | ICD-10-CM | POA: Diagnosis not present

## 2023-09-14 DIAGNOSIS — R5383 Other fatigue: Secondary | ICD-10-CM | POA: Diagnosis not present

## 2023-09-14 DIAGNOSIS — I480 Paroxysmal atrial fibrillation: Secondary | ICD-10-CM | POA: Diagnosis not present

## 2023-09-14 LAB — CBC WITH DIFFERENTIAL/PLATELET
Abs Immature Granulocytes: 0.02 10*3/uL (ref 0.00–0.07)
Basophils Absolute: 0 10*3/uL (ref 0.0–0.1)
Basophils Relative: 1 %
Eosinophils Absolute: 0.2 10*3/uL (ref 0.0–0.5)
Eosinophils Relative: 3 %
HCT: 36.1 % (ref 36.0–46.0)
Hemoglobin: 11.6 g/dL — ABNORMAL LOW (ref 12.0–15.0)
Immature Granulocytes: 0 %
Lymphocytes Relative: 32 %
Lymphs Abs: 2.6 10*3/uL (ref 0.7–4.0)
MCH: 30.1 pg (ref 26.0–34.0)
MCHC: 32.1 g/dL (ref 30.0–36.0)
MCV: 93.5 fL (ref 80.0–100.0)
Monocytes Absolute: 0.4 10*3/uL (ref 0.1–1.0)
Monocytes Relative: 6 %
Neutro Abs: 4.7 10*3/uL (ref 1.7–7.7)
Neutrophils Relative %: 58 %
Platelets: 212 10*3/uL (ref 150–400)
RBC: 3.86 MIL/uL — ABNORMAL LOW (ref 3.87–5.11)
RDW: 12.7 % (ref 11.5–15.5)
WBC: 8 10*3/uL (ref 4.0–10.5)
nRBC: 0 % (ref 0.0–0.2)

## 2023-09-14 LAB — COMPREHENSIVE METABOLIC PANEL WITH GFR
ALT: 19 U/L (ref 0–44)
AST: 30 U/L (ref 15–41)
Albumin: 3 g/dL — ABNORMAL LOW (ref 3.5–5.0)
Alkaline Phosphatase: 89 U/L (ref 38–126)
Anion gap: 7 (ref 5–15)
BUN: 21 mg/dL (ref 8–23)
CO2: 27 mmol/L (ref 22–32)
Calcium: 9.6 mg/dL (ref 8.9–10.3)
Chloride: 105 mmol/L (ref 98–111)
Creatinine, Ser: 1.2 mg/dL — ABNORMAL HIGH (ref 0.44–1.00)
GFR, Estimated: 49 mL/min — ABNORMAL LOW (ref >60)
Glucose, Bld: 117 mg/dL — ABNORMAL HIGH (ref 70–99)
Potassium: 4.3 mmol/L (ref 3.5–5.1)
Sodium: 139 mmol/L (ref 135–145)
Total Bilirubin: 0.5 mg/dL (ref 0.0–1.2)
Total Protein: 7.4 g/dL (ref 6.5–8.1)

## 2023-09-14 LAB — VITAMIN D 25 HYDROXY (VIT D DEFICIENCY, FRACTURES): Vit D, 25-Hydroxy: 72.61 ng/mL (ref 30–100)

## 2023-09-17 ENCOUNTER — Inpatient Hospital Stay: Payer: 59

## 2023-09-19 DIAGNOSIS — K3184 Gastroparesis: Secondary | ICD-10-CM | POA: Diagnosis not present

## 2023-09-19 DIAGNOSIS — I480 Paroxysmal atrial fibrillation: Secondary | ICD-10-CM | POA: Diagnosis not present

## 2023-09-19 DIAGNOSIS — E1140 Type 2 diabetes mellitus with diabetic neuropathy, unspecified: Secondary | ICD-10-CM | POA: Diagnosis not present

## 2023-09-19 DIAGNOSIS — I130 Hypertensive heart and chronic kidney disease with heart failure and stage 1 through stage 4 chronic kidney disease, or unspecified chronic kidney disease: Secondary | ICD-10-CM | POA: Diagnosis not present

## 2023-09-19 DIAGNOSIS — I5032 Chronic diastolic (congestive) heart failure: Secondary | ICD-10-CM | POA: Diagnosis not present

## 2023-09-19 DIAGNOSIS — L89613 Pressure ulcer of right heel, stage 3: Secondary | ICD-10-CM | POA: Diagnosis not present

## 2023-09-19 DIAGNOSIS — E785 Hyperlipidemia, unspecified: Secondary | ICD-10-CM | POA: Diagnosis not present

## 2023-09-19 DIAGNOSIS — S72002D Fracture of unspecified part of neck of left femur, subsequent encounter for closed fracture with routine healing: Secondary | ICD-10-CM | POA: Diagnosis not present

## 2023-09-19 DIAGNOSIS — E440 Moderate protein-calorie malnutrition: Secondary | ICD-10-CM | POA: Diagnosis not present

## 2023-09-19 DIAGNOSIS — E1143 Type 2 diabetes mellitus with diabetic autonomic (poly)neuropathy: Secondary | ICD-10-CM | POA: Diagnosis not present

## 2023-09-19 DIAGNOSIS — D631 Anemia in chronic kidney disease: Secondary | ICD-10-CM | POA: Diagnosis not present

## 2023-09-19 DIAGNOSIS — C50912 Malignant neoplasm of unspecified site of left female breast: Secondary | ICD-10-CM | POA: Diagnosis not present

## 2023-09-19 DIAGNOSIS — I429 Cardiomyopathy, unspecified: Secondary | ICD-10-CM | POA: Diagnosis not present

## 2023-09-19 DIAGNOSIS — D509 Iron deficiency anemia, unspecified: Secondary | ICD-10-CM | POA: Diagnosis not present

## 2023-09-19 DIAGNOSIS — N189 Chronic kidney disease, unspecified: Secondary | ICD-10-CM | POA: Diagnosis not present

## 2023-09-19 DIAGNOSIS — Z7984 Long term (current) use of oral hypoglycemic drugs: Secondary | ICD-10-CM | POA: Diagnosis not present

## 2023-09-19 DIAGNOSIS — Z7901 Long term (current) use of anticoagulants: Secondary | ICD-10-CM | POA: Diagnosis not present

## 2023-09-19 DIAGNOSIS — E1122 Type 2 diabetes mellitus with diabetic chronic kidney disease: Secondary | ICD-10-CM | POA: Diagnosis not present

## 2023-09-19 DIAGNOSIS — Z9181 History of falling: Secondary | ICD-10-CM | POA: Diagnosis not present

## 2023-09-19 DIAGNOSIS — G4733 Obstructive sleep apnea (adult) (pediatric): Secondary | ICD-10-CM | POA: Diagnosis not present

## 2023-09-23 NOTE — Progress Notes (Unsigned)
 [facility] [address], [place] [address]   CLINIC:  Medical Oncology/Hematology  PCP:  Tobie Suzzane POUR, MD [address] / [place] [address] [phone]   REASON FOR VISIT:  Follow-up for history of stage IIIb poorly differentiated left breast cancer   PRIOR THERAPY: - Neoadjuvant dose dense AC x4 cycles from 07/18/2017 through 09/03/2017 - Paclitaxel  weekly x12 cycles from 09/19/2017 through 12/06/2017 - Left modified radical mastectomy on 12/31/2017 - Herceptin  x1 year, completed 03/04/2019   CURRENT THERAPY: Anastrozole  (since 02/27/2018)  BRIEF ONCOLOGIC HISTORY:   Oncology History  Breast cancer of upper-outer quadrant of left female breast (HCC)  06/22/2017 Initial Diagnosis   Breast cancer of upper-outer quadrant of left female breast (HCC)   06/22/2017 Cancer Staging   Staging form: Breast, AJCC 8th Edition - Clinical stage from 06/22/2017: Stage IIIB (cT3, cN1, cM0, G3, ER+, PR-, HER2-) - Signed by Rogers Hai, MD on 06/22/2017   06/29/2017 Imaging   CT chest showing left axillary adenopathy, 1.2 cm anterior carinal adenopathy, left subpectoral lymph node subcentimeter  06/25/2017 2D echocardiogram with ejection fraction of 55-60%   07/18/2017 - 12/06/2017 Chemotherapy   The patient had dexamethasone  (DECADRON ) 4 MG tablet, 1 of 1 cycle, Start date: --, End date: -- DOXOrubicin  (ADRIAMYCIN ) chemo injection 110 mg, 60 mg/m2 = 110 mg, Intravenous,  Once, 4 of 4 cycles Administration: 110 mg (07/18/2017), 110 mg (08/01/2017), 110 mg (08/20/2017), 110 mg (09/03/2017) palonosetron  (ALOXI ) injection 0.25 mg, 0.25 mg, Intravenous,  Once, 4 of 4 cycles Administration: 0.25 mg (07/18/2017), 0.25 mg (08/01/2017), 0.25 mg (08/20/2017), 0.25 mg (09/03/2017) pegfilgrastim -cbqv (UDENYCA ) injection 6 mg, 6 mg, Subcutaneous, Once, 4 of 4 cycles Administration: 6 mg (07/19/2017), 6 mg (08/02/2017), 6 mg (08/22/2017), 6 mg (09/05/2017) cyclophosphamide  (CYTOXAN ) 1,100 mg  in sodium chloride  0.9 % 250 mL chemo infusion, 600 mg/m2 = 1,100 mg, Intravenous,  Once, 4 of 4 cycles Administration: 1,100 mg (07/18/2017), 1,100 mg (08/01/2017), 1,100 mg (08/20/2017), 1,100 mg (09/03/2017) PACLitaxel  (TAXOL ) 120 mg in sodium chloride  0.9 % 250 mL chemo infusion (</= 80mg /m2), 64 mg/m2 = 120 mg (80 % of original dose 80 mg/m2), Intravenous,  Once, 12 of 12 cycles Dose modification: 64 mg/m2 (80 % of original dose 80 mg/m2, Cycle 5, Reason: Provider Judgment), 60 mg/m2 (75 % of original dose 80 mg/m2, Cycle 15, Reason: Other (see comments)), 40 mg/m2 (50 % of original dose 80 mg/m2, Cycle 16, Reason: Other (see comments)) Administration: 120 mg (09/17/2017), 144 mg (09/24/2017), 144 mg (10/03/2017), 144 mg (10/11/2017), 144 mg (10/18/2017), 144 mg (10/25/2017), 144 mg (11/01/2017), 144 mg (11/08/2017), 144 mg (11/15/2017), 144 mg (11/22/2017), 108 mg (11/29/2017), 72 mg (12/06/2017) fosaprepitant  (EMEND ) 150 mg, dexamethasone  (DECADRON ) 12 mg in sodium chloride  0.9 % 145 mL IVPB, , Intravenous,  Once, 4 of 4 cycles Administration:  (07/18/2017),  (08/01/2017),  (08/20/2017),  (09/03/2017)  for chemotherapy treatment.    02/07/2018 - 03/04/2019 Chemotherapy   Patient is on Treatment Plan : BREAST Trastuzumab  q21d      Radiation Therapy          CANCER STAGING: Cancer Staging  Breast cancer of upper-outer quadrant of left female breast [facility]) Staging form: Breast, AJCC 8th Edition - Clinical stage from 06/22/2017: Stage IIIB (cT3, cN1, cM0, G3, ER+, PR-, HER2-) - Signed by Rogers Hai, MD on 06/22/2017   INTERVAL HISTORY:   Lisa Thornton, a 68 y.o. female, returns for routine follow-up of her left-sided breast cancer. Lisa Thornton  was last seen on 03/26/2023 by NP Delon Hope.  At today's visit, she  reports feeling ***.  She denies any recent hospitalizations, surgeries, or changes in her  baseline health status.  ***She denies any breast lumps or axillary lymphadenopathy. ***No new onset  pain, chest pain, dyspnea, or abdominal pain. ***She has no new headaches, seizures, or focal neurologic deficits. ***No B symptoms such as fever, chills, night sweats, unintentional weight loss.   ***She continues to take Arimidex .  She reports mild hair loss and intermittent leg cramps at night.  *** ***No hot flashes or mood swings.   ***She takes 1,000 units vitamin D  twice daily and 600 mg calcium  daily.    She reports ***% energy and ***% appetite.  She is maintaining stable weight at this time.   ASSESSMENT & PLAN:  1.  Poorly differentiated left-sided breast cancer, stage IIIb (s/p LEFT mastectomy) - On 06/12/2017 she had a left breast biopsy with sarcomatoid features, left axillary lymph node biopsy was negative. - Her 2D echo showed EF 55 to 60% - CT of the chest showed subcarinal adenopathy. - PET CT scan was negative for metastatic disease. -She had 4 cycles of neoadjuvant dose dense AC from 07/18/2017 through 09/03/2017. - She had 12 cycles of weekly paclitaxel  from 09/17/2017 through 12/06/2017. -She underwent a left modified radical mastectomy on 12/31/2017. - The pathology revealed a 2.3 cm poorly differentiated invasive ductal carcinoma with sarcomatoid changes, resection margins negative, high-grade DCIS, no skin involvement, 0 out of 8 lymph nodes positive, negative for lymphovascular or perineural invasion.  ER positive, PR and HER-2 negative.  Pathological staging is [id number]. - Her receptors were rechecked on a mastectomy specimen and HER-2 was positive by IHC.  This was initially negative by FISH on biopsy specimen.  ER was negative on mastectomy specimen.  It was 60% positive on biopsy specimen.  Ki 67 has decreased from 30% to 80% on biopsy.  This was reviewed by a second pathologist and confirmed. - 1 year of Herceptin  completed on 03/04/2019.  Pertuzumab was not added secondary to lymph node negativity.  - Anastrozole  started on 02/27/2018.*** Goal of treatment is 10 years.   *** - BCI testing (01/10/2022) showed statistically significant benefit from extended endocrine therapy.  (10.9% risk of late distant recurrence after 5 years of adjuvant endocrine therapy, compared to 3.6-4.6% risk of late distant recurrence after 10 years of adjuvant endocrine therapy). - Mammogram on 06/26/2022 of the right breast is BI-RADS Category 1, negative.   - *** Physical exam today: No discrete nodules or masses in right breast.  Left mastectomy site within normal limits.  No axillary, supraclavicular, pectoral, or epitrochlear lymphadenopathy - History/ROS ***. - Most recent labs (09/14/2023): Normal LFTs.  Kidney function at baseline (creatinine 1.20/GFR 49).  CBC unremarkable.  - No red flag symptoms of recurrence per ROS.*** - PLAN: Continue anastrozole  daily.*** - Overdue for annual mammogram (no-show in April 2025) - Since she is >5 years since diagnosis, we will transition to annual visits.  ***  2.  Osteopenia - DEXA (03/07/2018) showed T score -1.8 - DEXA (06/18/2020) showed T score -1.8, osteopenic - Most recent DEXA (06/26/2022): T-score -1.9, osteopenic - She takes vitamin D  1000 units twice daily and calcium  600 mg daily *** - Labs (09/14/2023) normal vitamin D  72.61, calcium  9.6 - PLAN:  Continue to recommend calcium , vitamin D , and weightbearing exercises for breast cancer therapy associated bone loss - Next DEXA due March 2026 ***   3.  Mild hypercalcemia - She had mildly elevated calcium  level ranging from 10.4-11 since August 2022.  This could be secondary to vitamin D  intake.  - Previous workup (12/19/2021): Calcium  10.0 per CMP.  Vitamin D  51.57.  Normal PTH 29, normal calcium  10.1 per intact PTH/calcium . - Most recent calcium  9.6 (NORMAL), with normal PTH (16) - PLAN: No further workup at this time, will continue periodic monitoring of calcium .  4.  Other history - Other PMH: Atrial fibrillation (Eliquis ), hypertension, diabetes, sleep apnea, diabetes  PLAN  SUMMARY: >> Mammogram due overdue (no-show in April 2025) *** >> Labs in 1 year = CBC/D, CMP, vitamin D  *** >> OFFICE visit in 1 year (1 week after labs)    REVIEW OF SYSTEMS: ***  Review of Systems - Oncology  PHYSICAL EXAM:   Performance status (ECOG): {CHL ONC ED:[id number]} *** There were no vitals filed for this visit. Wt Readings from Last 3 Encounters:  08/22/23 124 lb (56.2 kg)  08/16/23 124 lb (56.2 kg)  07/26/23 124 lb 6.4 oz (56.4 kg)   Physical Exam   PAST MEDICAL/SURGICAL HISTORY:  Past Medical History:  Diagnosis Date   Anemia 12/31/2017   Cancer (HCC)    left breast cancer   Chest pain    Diabetes mellitus without complication (HCC)    Hypertension    Past Surgical History:  Procedure Laterality Date   COLONOSCOPY WITH PROPOFOL  N/A 09/27/2021   Procedure: COLONOSCOPY WITH PROPOFOL ;  Surgeon: Eartha Angelia Sieving, MD;  Location: [facility];  Service: Gastroenterology;  Laterality: N/A;  945   MASTECTOMY MODIFIED RADICAL Left 12/31/2017   Procedure: LEFT MODIFIED RADICAL MASTECTOMY;  Surgeon: Mavis Anes, MD;  Location: [facility];  Service: General;  Laterality: Left;   PERCUTANEOUS PINNING Left 05/17/2023   Procedure: LEFT HIP PINNING;  Surgeon: Beverley Evalene BIRCH, MD;  Location: [facility] OR;  Service: Orthopedics;  Laterality: Left;   POLYPECTOMY  09/27/2021   Procedure: POLYPECTOMY;  Surgeon: Eartha Angelia, Sieving, MD;  Location: [facility];  Service: Gastroenterology;;   PORTACATH PLACEMENT Right 06/27/2017   Procedure: INSERTION PORT-A-CATH;  Surgeon: Mavis Anes, MD;  Location: [facility];  Service: General;  Laterality: Right;    SOCIAL HISTORY:  Social History   Socioeconomic History   Marital status: Divorced    Spouse name: Not on file   Number of children: 2   Years of education: 10   Highest education level: 10th grade  Occupational History   Occupation: Psychologist, 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  Tobacco Use   Smoking status: Never   Smokeless tobacco: Never   Vaping Use   Vaping status: Never Used  Substance and Sexual Activity   Alcohol use: No   Drug use: No   Sexual activity: Not Currently  Other Topics Concern   Not on file  Social History Narrative   Lives with sister   2 children   Dog: Cozie      Enjoys: puzzles, sewing, and walk      Diet: eats all food groups outside: leafy greens    Caffeine: limited, sweet tea   Water : 6-8 cups daily       No car; does have license, wears seat belt   Smoke detector at home   [place] area    Social Drivers of Health   Financial Resource Strain: Low Risk  (03/29/2021)   Overall Financial Resource Strain (CARDIA)    Difficulty of Paying Living Expenses: Not hard at all  Food Insecurity: No Food Insecurity (05/17/2023)  Hunger Vital Sign    Worried About Running Out of Food in the Last Year: Never true    Ran Out of Food in the Last Year: Never true  Transportation Needs: No Transportation Needs (05/17/2023)   PRAPARE - Administrator, Civil Service (Medical): No    Lack of Transportation (Non-Medical): No  Physical Activity: Insufficiently Active (03/29/2021)   Exercise Vital Sign    Days of Exercise per Week: 7 days    Minutes of Exercise per Session: 20 min  Stress: No Stress Concern Present (03/29/2021)   [employer] of Occupational Health - Occupational Stress Questionnaire    Feeling of Stress : Not at all  Social Connections: Socially Isolated (05/17/2023)   Social Connection and Isolation Panel    Frequency of Communication with Friends and Family: More than three times a week    Frequency of Social Gatherings with Friends and Family: Twice a week    Attends Religious Services: Never    Database administrator or Organizations: No    Attends Banker Meetings: Never    Marital Status: Divorced  Catering manager Violence: Not At Risk (05/17/2023)   Humiliation, Afraid, Rape, and Kick questionnaire    Fear of Current or Ex-Partner: No     Emotionally Abused: No    Physically Abused: No    Sexually Abused: No    FAMILY HISTORY:  Family History  Problem Relation Age of Onset   Breast cancer Mother    Thyroid  disease Mother    Heart disease Mother    Heart attack Father    Heart attack Sister    Hypertension Sister    Heart attack Brother    Cancer Sister    Stroke Brother    Alzheimer's disease Maternal Aunt     CURRENT MEDICATIONS:  Current Outpatient Medications  Medication Sig Dispense Refill   Accu-Chek Softclix Lancets lancets Use as instructed 100 each 0   anastrozole  (ARIMIDEX ) 1 MG tablet Take 1 tablet (1 mg total) by mouth daily. 90 tablet 1   apixaban  (ELIQUIS ) 5 MG TABS tablet Take 1 tablet (5 mg total) by mouth 2 (two) times daily. 180 tablet 3   atorvastatin  (LIPITOR) 10 MG tablet Take 1 tablet (10 mg total) by mouth at bedtime. 90 tablet 3   blood glucose meter kit and supplies Dispense based on patient and insurance preference. Use up to four times daily as directed. (FOR ICD-10 E10.9, E11.9). 1 each 0   cetirizine (ZYRTEC) 10 MG tablet Take 10 mg by mouth daily.     chlorhexidine  (HIBICLENS ) 4 % external liquid Apply 15 mLs (1 Application total) topically as directed for 30 doses. Use as directed daily for 5 days every other week for 6 weeks. 946 mL 1   Cholecalciferol  (VITAMIN D -3 PO) Take 1 tablet by mouth daily.     empagliflozin  (JARDIANCE ) 10 MG TABS tablet Take 1 tablet (10 mg total) by mouth daily. 90 tablet 3   feeding supplement (ENSURE ENLIVE / ENSURE PLUS) LIQD Take 237 mLs by mouth 2 (two) times daily between meals.     furosemide  (LASIX ) 40 MG tablet Take 0.5 tablets (20 mg total) by mouth daily as needed (swelling). 30 tablet 3   glucose blood (ACCU-CHEK GUIDE) test strip USE 1 STRIP TO CHECK GLUCOSE THREE TIMES DAILY MORNING,  NOON  AND  AT  BEDTIME  AS  DIRECTED 100 each 0   lactobacillus (FLORANEX/LACTINEX) PACK Take 1 packet by mouth  3 (three) times daily with meals.     magnesium   oxide (MAG-OX) 400 (240 Mg) MG tablet Take 1 tablet (400 mg total) by mouth daily. (Patient taking differently: Take 400 mg by mouth at bedtime.)     metFORMIN  (GLUCOPHAGE -XR) 500 MG 24 hr tablet Take 1 tablet (500 mg total) by mouth daily with breakfast. 90 tablet 3   metoprolol  succinate (TOPROL -XL) 100 MG 24 hr tablet Take 1 tablet (100 mg total) by mouth 2 (two) times daily. Take with or immediately following a meal. 180 tablet 3   mirtazapine  (REMERON ) 7.5 MG tablet Take 1 tablet (7.5 mg total) by mouth at bedtime. 30 tablet 3   mupirocin  ointment (BACTROBAN ) 2 % Apply 1 Application topically 2 (two) times daily.     ondansetron  (ZOFRAN ) 4 MG tablet Take 1 tablet (4 mg total) by mouth every 8 (eight) hours as needed for nausea or vomiting. 20 tablet 0   OVER THE COUNTER MEDICATION Take 1 tablet by mouth at bedtime. OTC stomach pill     Semaglutide  (RYBELSUS ) 7 MG TABS Take 1 tablet (7 mg total) by mouth daily. 90 tablet 1   traMADol  (ULTRAM ) 50 MG tablet Take 1 tablet (50 mg total) by mouth every 12 (twelve) hours as needed. 30 tablet 1   No current facility-administered medications for this visit.    ALLERGIES:  No Known Allergies  LABORATORY DATA:  I have reviewed the labs as listed.     Latest Ref Rng & Units 09/14/2023   11:36 AM 08/16/2023    9:40 AM 05/19/2023    5:36 AM  CBC  WBC 4.0 - 10.5 K/uL 8.0  7.9  9.8   Hemoglobin 12.0 - 15.0 g/dL 88.3  88.6  88.7   Hematocrit 36.0 - 46.0 % 36.1  36.2  33.0   Platelets 150 - 400 K/uL 212  253  174       Latest Ref Rng & Units 09/14/2023   11:36 AM 08/16/2023    9:40 AM 05/21/2023    2:57 AM  CMP  Glucose 70 - 99 mg/dL 882  881    BUN 8 - 23 mg/dL 21  15    Creatinine 9.55 - 1.00 mg/dL 8.79  8.86    Sodium 864 - 145 mmol/L 139  142    Potassium 3.5 - 5.1 mmol/L 4.3  4.4    Chloride 98 - 111 mmol/L 105  105    CO2 22 - 32 mmol/L 27  23    Calcium  8.9 - 10.3 mg/dL 9.6  9.7  9.1   Total Protein 6.5 - 8.1 g/dL 7.4  6.9    Total  Bilirubin 0.0 - 1.2 mg/dL 0.5  0.3    Alkaline Phos 38 - 126 U/L 89  122    AST 15 - 41 U/L 30  42    ALT 0 - 44 U/L 19  26      DIAGNOSTIC IMAGING:  I have independently reviewed the scans and discussed with the patient. No results found.   WRAP UP:  All questions were answered. The patient knows to call the clinic with any problems, questions or concerns.  Medical decision making: ***  Time spent on visit: I spent {CHL ONC TIME VISIT - DTPQU:[id number]} counseling the patient face to face. The total time spent in the appointment was {CHL ONC TIME VISIT - DTPQU:[id number]} and more than 50% was on counseling.  Pleasant CHRISTELLA Barefoot, PA-C  ***

## 2023-09-24 ENCOUNTER — Inpatient Hospital Stay (HOSPITAL_BASED_OUTPATIENT_CLINIC_OR_DEPARTMENT_OTHER): Payer: 59 | Admitting: Physician Assistant

## 2023-09-24 ENCOUNTER — Other Ambulatory Visit: Payer: Self-pay | Admitting: Internal Medicine

## 2023-09-24 DIAGNOSIS — I429 Cardiomyopathy, unspecified: Secondary | ICD-10-CM | POA: Diagnosis not present

## 2023-09-24 DIAGNOSIS — I130 Hypertensive heart and chronic kidney disease with heart failure and stage 1 through stage 4 chronic kidney disease, or unspecified chronic kidney disease: Secondary | ICD-10-CM | POA: Diagnosis not present

## 2023-09-24 DIAGNOSIS — L89613 Pressure ulcer of right heel, stage 3: Secondary | ICD-10-CM | POA: Diagnosis not present

## 2023-09-24 DIAGNOSIS — R5383 Other fatigue: Secondary | ICD-10-CM | POA: Diagnosis not present

## 2023-09-24 DIAGNOSIS — C50912 Malignant neoplasm of unspecified site of left female breast: Secondary | ICD-10-CM | POA: Diagnosis not present

## 2023-09-24 DIAGNOSIS — Z7984 Long term (current) use of oral hypoglycemic drugs: Secondary | ICD-10-CM | POA: Diagnosis not present

## 2023-09-24 DIAGNOSIS — K3184 Gastroparesis: Secondary | ICD-10-CM | POA: Diagnosis not present

## 2023-09-24 DIAGNOSIS — Z7901 Long term (current) use of anticoagulants: Secondary | ICD-10-CM | POA: Diagnosis not present

## 2023-09-24 DIAGNOSIS — Z9181 History of falling: Secondary | ICD-10-CM | POA: Diagnosis not present

## 2023-09-24 DIAGNOSIS — Z79811 Long term (current) use of aromatase inhibitors: Secondary | ICD-10-CM | POA: Diagnosis not present

## 2023-09-24 DIAGNOSIS — N189 Chronic kidney disease, unspecified: Secondary | ICD-10-CM | POA: Diagnosis not present

## 2023-09-24 DIAGNOSIS — C50412 Malignant neoplasm of upper-outer quadrant of left female breast: Secondary | ICD-10-CM | POA: Diagnosis not present

## 2023-09-24 DIAGNOSIS — E440 Moderate protein-calorie malnutrition: Secondary | ICD-10-CM | POA: Diagnosis not present

## 2023-09-24 DIAGNOSIS — E1143 Type 2 diabetes mellitus with diabetic autonomic (poly)neuropathy: Secondary | ICD-10-CM | POA: Diagnosis not present

## 2023-09-24 DIAGNOSIS — Z170 Estrogen receptor positive status [ER+]: Secondary | ICD-10-CM | POA: Diagnosis not present

## 2023-09-24 DIAGNOSIS — E785 Hyperlipidemia, unspecified: Secondary | ICD-10-CM | POA: Diagnosis not present

## 2023-09-24 DIAGNOSIS — S72002D Fracture of unspecified part of neck of left femur, subsequent encounter for closed fracture with routine healing: Secondary | ICD-10-CM | POA: Diagnosis not present

## 2023-09-24 DIAGNOSIS — E1140 Type 2 diabetes mellitus with diabetic neuropathy, unspecified: Secondary | ICD-10-CM | POA: Diagnosis not present

## 2023-09-24 DIAGNOSIS — E1122 Type 2 diabetes mellitus with diabetic chronic kidney disease: Secondary | ICD-10-CM | POA: Diagnosis not present

## 2023-09-24 DIAGNOSIS — M8580 Other specified disorders of bone density and structure, unspecified site: Secondary | ICD-10-CM | POA: Diagnosis not present

## 2023-09-24 DIAGNOSIS — G4733 Obstructive sleep apnea (adult) (pediatric): Secondary | ICD-10-CM | POA: Diagnosis not present

## 2023-09-24 DIAGNOSIS — D509 Iron deficiency anemia, unspecified: Secondary | ICD-10-CM | POA: Diagnosis not present

## 2023-09-24 DIAGNOSIS — D631 Anemia in chronic kidney disease: Secondary | ICD-10-CM | POA: Diagnosis not present

## 2023-09-24 DIAGNOSIS — Z9012 Acquired absence of left breast and nipple: Secondary | ICD-10-CM | POA: Diagnosis not present

## 2023-09-24 DIAGNOSIS — E1165 Type 2 diabetes mellitus with hyperglycemia: Secondary | ICD-10-CM

## 2023-09-24 DIAGNOSIS — I480 Paroxysmal atrial fibrillation: Secondary | ICD-10-CM | POA: Diagnosis not present

## 2023-09-24 DIAGNOSIS — I5032 Chronic diastolic (congestive) heart failure: Secondary | ICD-10-CM | POA: Diagnosis not present

## 2023-09-24 NOTE — Patient Instructions (Signed)
 [facility] at [facility] **VISIT SUMMARY & IMPORTANT INSTRUCTIONS **   You were seen today by Pleasant Barefoot PA-C for your history of breast cancer.    HISTORY OF BREAST CANCER Your labs and physical exam did not show any signs of returning breast cancer at today's visit. You are due for repeat mammogram, Continue to take anastrozole  daily to help prevent recurrent breast cancer.  OSTEOPENIA You have mild weakness in your bones, which can be a side effect of your cancer treatment. We will check another bone density scan in March 2026. Continue to take vitamin D  1000 units twice daily.  You do not need any calcium  supplements.  FOLLOW-UP APPOINTMENT: 1 year  ** Thank you for trusting me with your healthcare!  I strive to provide all of my patients with quality care at each visit.  If you receive a survey for this visit, I would be so grateful to you for taking the time to provide feedback.  Thank you in advance!  ~ Aveyah Greenwood                   Dr. Alean Stands   &   Pleasant Barefoot, PA-C   - - - - - - - - - - - - - - - - - -    Thank you for choosing [facility] at [facility] to provide your oncology and hematology care.  To afford each patient quality time with our provider, please arrive at least 15 minutes before your scheduled appointment time.   If you have a lab appointment with the Cancer Center please come in thru the Main Entrance and check in at the main information desk.  You need to re-schedule your appointment should you arrive 10 or more minutes late.  We strive to give you quality time with our providers, and arriving late affects you and other patients whose appointments are after yours.  Also, if you no show three or more times for appointments you may be dismissed from the clinic at the providers discretion.     Again, thank you for choosing [facility].  Our hope is that these requests will decrease  the amount of time that you wait before being seen by our physicians.       _____________________________________________________________  Should you have questions after your visit to [facility], please contact our office at [phone] and follow the prompts.  Our office hours are 8:00 a.m. and 4:30 p.m. Monday - Friday.  Please note that voicemails left after 4:00 p.m. may not be returned until the following business day.  We are closed weekends and major holidays.  You do have access to a nurse 24-7, just call the main number to the clinic [phone] and do not press any options, hold on the line and a nurse will answer the phone.    For prescription refill requests, have your pharmacy contact our office and allow 72 hours.

## 2023-09-25 DIAGNOSIS — E785 Hyperlipidemia, unspecified: Secondary | ICD-10-CM | POA: Diagnosis not present

## 2023-09-25 DIAGNOSIS — I480 Paroxysmal atrial fibrillation: Secondary | ICD-10-CM | POA: Diagnosis not present

## 2023-09-25 DIAGNOSIS — N189 Chronic kidney disease, unspecified: Secondary | ICD-10-CM | POA: Diagnosis not present

## 2023-09-25 DIAGNOSIS — I5032 Chronic diastolic (congestive) heart failure: Secondary | ICD-10-CM | POA: Diagnosis not present

## 2023-09-25 DIAGNOSIS — S91301A Unspecified open wound, right foot, initial encounter: Secondary | ICD-10-CM | POA: Diagnosis not present

## 2023-09-25 DIAGNOSIS — E1122 Type 2 diabetes mellitus with diabetic chronic kidney disease: Secondary | ICD-10-CM | POA: Diagnosis not present

## 2023-09-25 DIAGNOSIS — I429 Cardiomyopathy, unspecified: Secondary | ICD-10-CM | POA: Diagnosis not present

## 2023-09-25 DIAGNOSIS — G4733 Obstructive sleep apnea (adult) (pediatric): Secondary | ICD-10-CM | POA: Diagnosis not present

## 2023-09-25 DIAGNOSIS — D509 Iron deficiency anemia, unspecified: Secondary | ICD-10-CM | POA: Diagnosis not present

## 2023-09-25 DIAGNOSIS — Z9181 History of falling: Secondary | ICD-10-CM | POA: Diagnosis not present

## 2023-09-25 DIAGNOSIS — Z7984 Long term (current) use of oral hypoglycemic drugs: Secondary | ICD-10-CM | POA: Diagnosis not present

## 2023-09-25 DIAGNOSIS — L89613 Pressure ulcer of right heel, stage 3: Secondary | ICD-10-CM | POA: Diagnosis not present

## 2023-09-25 DIAGNOSIS — Z7901 Long term (current) use of anticoagulants: Secondary | ICD-10-CM | POA: Diagnosis not present

## 2023-09-25 DIAGNOSIS — K3184 Gastroparesis: Secondary | ICD-10-CM | POA: Diagnosis not present

## 2023-09-25 DIAGNOSIS — E440 Moderate protein-calorie malnutrition: Secondary | ICD-10-CM | POA: Diagnosis not present

## 2023-09-25 DIAGNOSIS — S72002D Fracture of unspecified part of neck of left femur, subsequent encounter for closed fracture with routine healing: Secondary | ICD-10-CM | POA: Diagnosis not present

## 2023-09-25 DIAGNOSIS — C50912 Malignant neoplasm of unspecified site of left female breast: Secondary | ICD-10-CM | POA: Diagnosis not present

## 2023-09-25 DIAGNOSIS — E1143 Type 2 diabetes mellitus with diabetic autonomic (poly)neuropathy: Secondary | ICD-10-CM | POA: Diagnosis not present

## 2023-09-25 DIAGNOSIS — D631 Anemia in chronic kidney disease: Secondary | ICD-10-CM | POA: Diagnosis not present

## 2023-09-25 DIAGNOSIS — E1140 Type 2 diabetes mellitus with diabetic neuropathy, unspecified: Secondary | ICD-10-CM | POA: Diagnosis not present

## 2023-09-25 DIAGNOSIS — I130 Hypertensive heart and chronic kidney disease with heart failure and stage 1 through stage 4 chronic kidney disease, or unspecified chronic kidney disease: Secondary | ICD-10-CM | POA: Diagnosis not present

## 2023-09-26 ENCOUNTER — Ambulatory Visit (HOSPITAL_COMMUNITY)
Admission: RE | Admit: 2023-09-26 | Discharge: 2023-09-26 | Disposition: A | Discharge status: Home / Self Care | Source: Ambulatory Visit | Attending: Oncology | Admitting: Oncology

## 2023-09-26 DIAGNOSIS — C50412 Malignant neoplasm of upper-outer quadrant of left female breast: Secondary | ICD-10-CM

## 2023-09-26 DIAGNOSIS — Z1231 Encounter for screening mammogram for malignant neoplasm of breast: Secondary | ICD-10-CM | POA: Diagnosis not present

## 2023-09-26 DIAGNOSIS — Z7901 Long term (current) use of anticoagulants: Secondary | ICD-10-CM | POA: Diagnosis not present

## 2023-09-26 DIAGNOSIS — I10 Essential (primary) hypertension: Secondary | ICD-10-CM | POA: Diagnosis not present

## 2023-09-26 DIAGNOSIS — E11621 Type 2 diabetes mellitus with foot ulcer: Secondary | ICD-10-CM | POA: Diagnosis not present

## 2023-09-26 DIAGNOSIS — Z7984 Long term (current) use of oral hypoglycemic drugs: Secondary | ICD-10-CM | POA: Diagnosis not present

## 2023-09-26 DIAGNOSIS — L97412 Non-pressure chronic ulcer of right heel and midfoot with fat layer exposed: Secondary | ICD-10-CM | POA: Diagnosis not present

## 2023-09-26 DIAGNOSIS — E785 Hyperlipidemia, unspecified: Secondary | ICD-10-CM | POA: Diagnosis not present

## 2023-09-28 DIAGNOSIS — E1143 Type 2 diabetes mellitus with diabetic autonomic (poly)neuropathy: Secondary | ICD-10-CM | POA: Diagnosis not present

## 2023-09-30 DIAGNOSIS — S52502D Unspecified fracture of the lower end of left radius, subsequent encounter for closed fracture with routine healing: Secondary | ICD-10-CM | POA: Diagnosis not present

## 2023-09-30 DIAGNOSIS — Z4789 Encounter for other orthopedic aftercare: Secondary | ICD-10-CM | POA: Diagnosis not present

## 2023-09-30 DIAGNOSIS — E1122 Type 2 diabetes mellitus with diabetic chronic kidney disease: Secondary | ICD-10-CM | POA: Diagnosis not present

## 2023-09-30 DIAGNOSIS — S72002D Fracture of unspecified part of neck of left femur, subsequent encounter for closed fracture with routine healing: Secondary | ICD-10-CM | POA: Diagnosis not present

## 2023-09-30 DIAGNOSIS — Z794 Long term (current) use of insulin: Secondary | ICD-10-CM | POA: Diagnosis not present

## 2023-10-01 DIAGNOSIS — E1143 Type 2 diabetes mellitus with diabetic autonomic (poly)neuropathy: Secondary | ICD-10-CM | POA: Diagnosis not present

## 2023-10-01 DIAGNOSIS — E440 Moderate protein-calorie malnutrition: Secondary | ICD-10-CM | POA: Diagnosis not present

## 2023-10-01 DIAGNOSIS — S72002D Fracture of unspecified part of neck of left femur, subsequent encounter for closed fracture with routine healing: Secondary | ICD-10-CM | POA: Diagnosis not present

## 2023-10-01 DIAGNOSIS — N189 Chronic kidney disease, unspecified: Secondary | ICD-10-CM | POA: Diagnosis not present

## 2023-10-01 DIAGNOSIS — E1140 Type 2 diabetes mellitus with diabetic neuropathy, unspecified: Secondary | ICD-10-CM | POA: Diagnosis not present

## 2023-10-01 DIAGNOSIS — D631 Anemia in chronic kidney disease: Secondary | ICD-10-CM | POA: Diagnosis not present

## 2023-10-01 DIAGNOSIS — I480 Paroxysmal atrial fibrillation: Secondary | ICD-10-CM | POA: Diagnosis not present

## 2023-10-01 DIAGNOSIS — I5032 Chronic diastolic (congestive) heart failure: Secondary | ICD-10-CM | POA: Diagnosis not present

## 2023-10-01 DIAGNOSIS — E785 Hyperlipidemia, unspecified: Secondary | ICD-10-CM | POA: Diagnosis not present

## 2023-10-01 DIAGNOSIS — E1122 Type 2 diabetes mellitus with diabetic chronic kidney disease: Secondary | ICD-10-CM | POA: Diagnosis not present

## 2023-10-01 DIAGNOSIS — C50912 Malignant neoplasm of unspecified site of left female breast: Secondary | ICD-10-CM | POA: Diagnosis not present

## 2023-10-01 DIAGNOSIS — D509 Iron deficiency anemia, unspecified: Secondary | ICD-10-CM | POA: Diagnosis not present

## 2023-10-01 DIAGNOSIS — Z7984 Long term (current) use of oral hypoglycemic drugs: Secondary | ICD-10-CM | POA: Diagnosis not present

## 2023-10-01 DIAGNOSIS — G4733 Obstructive sleep apnea (adult) (pediatric): Secondary | ICD-10-CM | POA: Diagnosis not present

## 2023-10-01 DIAGNOSIS — L89613 Pressure ulcer of right heel, stage 3: Secondary | ICD-10-CM | POA: Diagnosis not present

## 2023-10-01 DIAGNOSIS — K3184 Gastroparesis: Secondary | ICD-10-CM | POA: Diagnosis not present

## 2023-10-01 DIAGNOSIS — Z9181 History of falling: Secondary | ICD-10-CM | POA: Diagnosis not present

## 2023-10-01 DIAGNOSIS — I429 Cardiomyopathy, unspecified: Secondary | ICD-10-CM | POA: Diagnosis not present

## 2023-10-01 DIAGNOSIS — I130 Hypertensive heart and chronic kidney disease with heart failure and stage 1 through stage 4 chronic kidney disease, or unspecified chronic kidney disease: Secondary | ICD-10-CM | POA: Diagnosis not present

## 2023-10-01 DIAGNOSIS — Z7901 Long term (current) use of anticoagulants: Secondary | ICD-10-CM | POA: Diagnosis not present

## 2023-10-02 DIAGNOSIS — I509 Heart failure, unspecified: Secondary | ICD-10-CM | POA: Diagnosis not present

## 2023-10-02 DIAGNOSIS — R2689 Other abnormalities of gait and mobility: Secondary | ICD-10-CM | POA: Diagnosis not present

## 2023-10-02 DIAGNOSIS — R262 Difficulty in walking, not elsewhere classified: Secondary | ICD-10-CM | POA: Diagnosis not present

## 2023-10-09 DIAGNOSIS — I480 Paroxysmal atrial fibrillation: Secondary | ICD-10-CM | POA: Diagnosis not present

## 2023-10-09 DIAGNOSIS — E1122 Type 2 diabetes mellitus with diabetic chronic kidney disease: Secondary | ICD-10-CM | POA: Diagnosis not present

## 2023-10-09 DIAGNOSIS — E1143 Type 2 diabetes mellitus with diabetic autonomic (poly)neuropathy: Secondary | ICD-10-CM | POA: Diagnosis not present

## 2023-10-09 DIAGNOSIS — I429 Cardiomyopathy, unspecified: Secondary | ICD-10-CM | POA: Diagnosis not present

## 2023-10-09 DIAGNOSIS — S72002D Fracture of unspecified part of neck of left femur, subsequent encounter for closed fracture with routine healing: Secondary | ICD-10-CM | POA: Diagnosis not present

## 2023-10-09 DIAGNOSIS — L89613 Pressure ulcer of right heel, stage 3: Secondary | ICD-10-CM | POA: Diagnosis not present

## 2023-10-09 DIAGNOSIS — D509 Iron deficiency anemia, unspecified: Secondary | ICD-10-CM | POA: Diagnosis not present

## 2023-10-09 DIAGNOSIS — E1140 Type 2 diabetes mellitus with diabetic neuropathy, unspecified: Secondary | ICD-10-CM | POA: Diagnosis not present

## 2023-10-09 DIAGNOSIS — Z9181 History of falling: Secondary | ICD-10-CM | POA: Diagnosis not present

## 2023-10-09 DIAGNOSIS — I5032 Chronic diastolic (congestive) heart failure: Secondary | ICD-10-CM | POA: Diagnosis not present

## 2023-10-09 DIAGNOSIS — E440 Moderate protein-calorie malnutrition: Secondary | ICD-10-CM | POA: Diagnosis not present

## 2023-10-09 DIAGNOSIS — E785 Hyperlipidemia, unspecified: Secondary | ICD-10-CM | POA: Diagnosis not present

## 2023-10-09 DIAGNOSIS — Z7984 Long term (current) use of oral hypoglycemic drugs: Secondary | ICD-10-CM | POA: Diagnosis not present

## 2023-10-09 DIAGNOSIS — D631 Anemia in chronic kidney disease: Secondary | ICD-10-CM | POA: Diagnosis not present

## 2023-10-09 DIAGNOSIS — I130 Hypertensive heart and chronic kidney disease with heart failure and stage 1 through stage 4 chronic kidney disease, or unspecified chronic kidney disease: Secondary | ICD-10-CM | POA: Diagnosis not present

## 2023-10-09 DIAGNOSIS — C50912 Malignant neoplasm of unspecified site of left female breast: Secondary | ICD-10-CM | POA: Diagnosis not present

## 2023-10-09 DIAGNOSIS — G4733 Obstructive sleep apnea (adult) (pediatric): Secondary | ICD-10-CM | POA: Diagnosis not present

## 2023-10-09 DIAGNOSIS — N189 Chronic kidney disease, unspecified: Secondary | ICD-10-CM | POA: Diagnosis not present

## 2023-10-09 DIAGNOSIS — K3184 Gastroparesis: Secondary | ICD-10-CM | POA: Diagnosis not present

## 2023-10-09 DIAGNOSIS — Z7901 Long term (current) use of anticoagulants: Secondary | ICD-10-CM | POA: Diagnosis not present

## 2023-10-16 DIAGNOSIS — N189 Chronic kidney disease, unspecified: Secondary | ICD-10-CM | POA: Diagnosis not present

## 2023-10-16 DIAGNOSIS — E1140 Type 2 diabetes mellitus with diabetic neuropathy, unspecified: Secondary | ICD-10-CM | POA: Diagnosis not present

## 2023-10-16 DIAGNOSIS — Z9181 History of falling: Secondary | ICD-10-CM | POA: Diagnosis not present

## 2023-10-16 DIAGNOSIS — E440 Moderate protein-calorie malnutrition: Secondary | ICD-10-CM | POA: Diagnosis not present

## 2023-10-16 DIAGNOSIS — I5032 Chronic diastolic (congestive) heart failure: Secondary | ICD-10-CM | POA: Diagnosis not present

## 2023-10-16 DIAGNOSIS — I429 Cardiomyopathy, unspecified: Secondary | ICD-10-CM | POA: Diagnosis not present

## 2023-10-16 DIAGNOSIS — Z7901 Long term (current) use of anticoagulants: Secondary | ICD-10-CM | POA: Diagnosis not present

## 2023-10-16 DIAGNOSIS — K3184 Gastroparesis: Secondary | ICD-10-CM | POA: Diagnosis not present

## 2023-10-16 DIAGNOSIS — E1122 Type 2 diabetes mellitus with diabetic chronic kidney disease: Secondary | ICD-10-CM | POA: Diagnosis not present

## 2023-10-16 DIAGNOSIS — D631 Anemia in chronic kidney disease: Secondary | ICD-10-CM | POA: Diagnosis not present

## 2023-10-16 DIAGNOSIS — G4733 Obstructive sleep apnea (adult) (pediatric): Secondary | ICD-10-CM | POA: Diagnosis not present

## 2023-10-16 DIAGNOSIS — L89613 Pressure ulcer of right heel, stage 3: Secondary | ICD-10-CM | POA: Diagnosis not present

## 2023-10-16 DIAGNOSIS — S72002D Fracture of unspecified part of neck of left femur, subsequent encounter for closed fracture with routine healing: Secondary | ICD-10-CM | POA: Diagnosis not present

## 2023-10-16 DIAGNOSIS — I480 Paroxysmal atrial fibrillation: Secondary | ICD-10-CM | POA: Diagnosis not present

## 2023-10-16 DIAGNOSIS — Z7984 Long term (current) use of oral hypoglycemic drugs: Secondary | ICD-10-CM | POA: Diagnosis not present

## 2023-10-16 DIAGNOSIS — I130 Hypertensive heart and chronic kidney disease with heart failure and stage 1 through stage 4 chronic kidney disease, or unspecified chronic kidney disease: Secondary | ICD-10-CM | POA: Diagnosis not present

## 2023-10-16 DIAGNOSIS — E785 Hyperlipidemia, unspecified: Secondary | ICD-10-CM | POA: Diagnosis not present

## 2023-10-16 DIAGNOSIS — D509 Iron deficiency anemia, unspecified: Secondary | ICD-10-CM | POA: Diagnosis not present

## 2023-10-16 DIAGNOSIS — E1143 Type 2 diabetes mellitus with diabetic autonomic (poly)neuropathy: Secondary | ICD-10-CM | POA: Diagnosis not present

## 2023-10-16 DIAGNOSIS — C50912 Malignant neoplasm of unspecified site of left female breast: Secondary | ICD-10-CM | POA: Diagnosis not present

## 2023-10-22 DIAGNOSIS — I480 Paroxysmal atrial fibrillation: Secondary | ICD-10-CM | POA: Diagnosis not present

## 2023-10-22 DIAGNOSIS — K3184 Gastroparesis: Secondary | ICD-10-CM | POA: Diagnosis not present

## 2023-10-22 DIAGNOSIS — D509 Iron deficiency anemia, unspecified: Secondary | ICD-10-CM | POA: Diagnosis not present

## 2023-10-22 DIAGNOSIS — E1122 Type 2 diabetes mellitus with diabetic chronic kidney disease: Secondary | ICD-10-CM | POA: Diagnosis not present

## 2023-10-22 DIAGNOSIS — L89613 Pressure ulcer of right heel, stage 3: Secondary | ICD-10-CM | POA: Diagnosis not present

## 2023-10-22 DIAGNOSIS — Z7901 Long term (current) use of anticoagulants: Secondary | ICD-10-CM | POA: Diagnosis not present

## 2023-10-22 DIAGNOSIS — N189 Chronic kidney disease, unspecified: Secondary | ICD-10-CM | POA: Diagnosis not present

## 2023-10-22 DIAGNOSIS — I5032 Chronic diastolic (congestive) heart failure: Secondary | ICD-10-CM | POA: Diagnosis not present

## 2023-10-22 DIAGNOSIS — Z9181 History of falling: Secondary | ICD-10-CM | POA: Diagnosis not present

## 2023-10-22 DIAGNOSIS — E1140 Type 2 diabetes mellitus with diabetic neuropathy, unspecified: Secondary | ICD-10-CM | POA: Diagnosis not present

## 2023-10-22 DIAGNOSIS — I130 Hypertensive heart and chronic kidney disease with heart failure and stage 1 through stage 4 chronic kidney disease, or unspecified chronic kidney disease: Secondary | ICD-10-CM | POA: Diagnosis not present

## 2023-10-22 DIAGNOSIS — G4733 Obstructive sleep apnea (adult) (pediatric): Secondary | ICD-10-CM | POA: Diagnosis not present

## 2023-10-22 DIAGNOSIS — C50912 Malignant neoplasm of unspecified site of left female breast: Secondary | ICD-10-CM | POA: Diagnosis not present

## 2023-10-22 DIAGNOSIS — I429 Cardiomyopathy, unspecified: Secondary | ICD-10-CM | POA: Diagnosis not present

## 2023-10-22 DIAGNOSIS — Z7984 Long term (current) use of oral hypoglycemic drugs: Secondary | ICD-10-CM | POA: Diagnosis not present

## 2023-10-22 DIAGNOSIS — E440 Moderate protein-calorie malnutrition: Secondary | ICD-10-CM | POA: Diagnosis not present

## 2023-10-22 DIAGNOSIS — E785 Hyperlipidemia, unspecified: Secondary | ICD-10-CM | POA: Diagnosis not present

## 2023-10-22 DIAGNOSIS — E1143 Type 2 diabetes mellitus with diabetic autonomic (poly)neuropathy: Secondary | ICD-10-CM | POA: Diagnosis not present

## 2023-10-22 DIAGNOSIS — S72002D Fracture of unspecified part of neck of left femur, subsequent encounter for closed fracture with routine healing: Secondary | ICD-10-CM | POA: Diagnosis not present

## 2023-10-22 DIAGNOSIS — D631 Anemia in chronic kidney disease: Secondary | ICD-10-CM | POA: Diagnosis not present

## 2023-10-23 DIAGNOSIS — E1143 Type 2 diabetes mellitus with diabetic autonomic (poly)neuropathy: Secondary | ICD-10-CM | POA: Diagnosis not present

## 2023-10-24 DIAGNOSIS — L89613 Pressure ulcer of right heel, stage 3: Secondary | ICD-10-CM | POA: Diagnosis not present

## 2023-10-24 DIAGNOSIS — L97412 Non-pressure chronic ulcer of right heel and midfoot with fat layer exposed: Secondary | ICD-10-CM | POA: Diagnosis not present

## 2023-10-25 DIAGNOSIS — S91301A Unspecified open wound, right foot, initial encounter: Secondary | ICD-10-CM | POA: Diagnosis not present

## 2023-10-30 DIAGNOSIS — S52502D Unspecified fracture of the lower end of left radius, subsequent encounter for closed fracture with routine healing: Secondary | ICD-10-CM | POA: Diagnosis not present

## 2023-10-30 DIAGNOSIS — Z794 Long term (current) use of insulin: Secondary | ICD-10-CM | POA: Diagnosis not present

## 2023-10-30 DIAGNOSIS — S72002D Fracture of unspecified part of neck of left femur, subsequent encounter for closed fracture with routine healing: Secondary | ICD-10-CM | POA: Diagnosis not present

## 2023-10-30 DIAGNOSIS — E1122 Type 2 diabetes mellitus with diabetic chronic kidney disease: Secondary | ICD-10-CM | POA: Diagnosis not present

## 2023-10-30 DIAGNOSIS — Z4789 Encounter for other orthopedic aftercare: Secondary | ICD-10-CM | POA: Diagnosis not present

## 2023-11-01 DIAGNOSIS — I5032 Chronic diastolic (congestive) heart failure: Secondary | ICD-10-CM | POA: Diagnosis not present

## 2023-11-01 DIAGNOSIS — E440 Moderate protein-calorie malnutrition: Secondary | ICD-10-CM | POA: Diagnosis not present

## 2023-11-01 DIAGNOSIS — I130 Hypertensive heart and chronic kidney disease with heart failure and stage 1 through stage 4 chronic kidney disease, or unspecified chronic kidney disease: Secondary | ICD-10-CM | POA: Diagnosis not present

## 2023-11-01 DIAGNOSIS — I480 Paroxysmal atrial fibrillation: Secondary | ICD-10-CM | POA: Diagnosis not present

## 2023-11-01 DIAGNOSIS — K3184 Gastroparesis: Secondary | ICD-10-CM | POA: Diagnosis not present

## 2023-11-01 DIAGNOSIS — C50912 Malignant neoplasm of unspecified site of left female breast: Secondary | ICD-10-CM | POA: Diagnosis not present

## 2023-11-01 DIAGNOSIS — S72002D Fracture of unspecified part of neck of left femur, subsequent encounter for closed fracture with routine healing: Secondary | ICD-10-CM | POA: Diagnosis not present

## 2023-11-01 DIAGNOSIS — D631 Anemia in chronic kidney disease: Secondary | ICD-10-CM | POA: Diagnosis not present

## 2023-11-01 DIAGNOSIS — L89613 Pressure ulcer of right heel, stage 3: Secondary | ICD-10-CM | POA: Diagnosis not present

## 2023-11-01 DIAGNOSIS — Z7984 Long term (current) use of oral hypoglycemic drugs: Secondary | ICD-10-CM | POA: Diagnosis not present

## 2023-11-01 DIAGNOSIS — E1140 Type 2 diabetes mellitus with diabetic neuropathy, unspecified: Secondary | ICD-10-CM | POA: Diagnosis not present

## 2023-11-01 DIAGNOSIS — E1143 Type 2 diabetes mellitus with diabetic autonomic (poly)neuropathy: Secondary | ICD-10-CM | POA: Diagnosis not present

## 2023-11-01 DIAGNOSIS — E1122 Type 2 diabetes mellitus with diabetic chronic kidney disease: Secondary | ICD-10-CM | POA: Diagnosis not present

## 2023-11-01 DIAGNOSIS — E785 Hyperlipidemia, unspecified: Secondary | ICD-10-CM | POA: Diagnosis not present

## 2023-11-01 DIAGNOSIS — Z7901 Long term (current) use of anticoagulants: Secondary | ICD-10-CM | POA: Diagnosis not present

## 2023-11-01 DIAGNOSIS — I429 Cardiomyopathy, unspecified: Secondary | ICD-10-CM | POA: Diagnosis not present

## 2023-11-01 DIAGNOSIS — G4733 Obstructive sleep apnea (adult) (pediatric): Secondary | ICD-10-CM | POA: Diagnosis not present

## 2023-11-01 DIAGNOSIS — N189 Chronic kidney disease, unspecified: Secondary | ICD-10-CM | POA: Diagnosis not present

## 2023-11-01 DIAGNOSIS — D509 Iron deficiency anemia, unspecified: Secondary | ICD-10-CM | POA: Diagnosis not present

## 2023-11-01 DIAGNOSIS — Z9181 History of falling: Secondary | ICD-10-CM | POA: Diagnosis not present

## 2023-11-05 DIAGNOSIS — I429 Cardiomyopathy, unspecified: Secondary | ICD-10-CM | POA: Diagnosis not present

## 2023-11-05 DIAGNOSIS — Z9181 History of falling: Secondary | ICD-10-CM | POA: Diagnosis not present

## 2023-11-05 DIAGNOSIS — K3184 Gastroparesis: Secondary | ICD-10-CM | POA: Diagnosis not present

## 2023-11-05 DIAGNOSIS — C50912 Malignant neoplasm of unspecified site of left female breast: Secondary | ICD-10-CM | POA: Diagnosis not present

## 2023-11-05 DIAGNOSIS — N189 Chronic kidney disease, unspecified: Secondary | ICD-10-CM | POA: Diagnosis not present

## 2023-11-05 DIAGNOSIS — Z7984 Long term (current) use of oral hypoglycemic drugs: Secondary | ICD-10-CM | POA: Diagnosis not present

## 2023-11-05 DIAGNOSIS — D631 Anemia in chronic kidney disease: Secondary | ICD-10-CM | POA: Diagnosis not present

## 2023-11-05 DIAGNOSIS — I5032 Chronic diastolic (congestive) heart failure: Secondary | ICD-10-CM | POA: Diagnosis not present

## 2023-11-05 DIAGNOSIS — I130 Hypertensive heart and chronic kidney disease with heart failure and stage 1 through stage 4 chronic kidney disease, or unspecified chronic kidney disease: Secondary | ICD-10-CM | POA: Diagnosis not present

## 2023-11-05 DIAGNOSIS — E1122 Type 2 diabetes mellitus with diabetic chronic kidney disease: Secondary | ICD-10-CM | POA: Diagnosis not present

## 2023-11-05 DIAGNOSIS — E1143 Type 2 diabetes mellitus with diabetic autonomic (poly)neuropathy: Secondary | ICD-10-CM | POA: Diagnosis not present

## 2023-11-05 DIAGNOSIS — E440 Moderate protein-calorie malnutrition: Secondary | ICD-10-CM | POA: Diagnosis not present

## 2023-11-05 DIAGNOSIS — L89613 Pressure ulcer of right heel, stage 3: Secondary | ICD-10-CM | POA: Diagnosis not present

## 2023-11-05 DIAGNOSIS — E785 Hyperlipidemia, unspecified: Secondary | ICD-10-CM | POA: Diagnosis not present

## 2023-11-05 DIAGNOSIS — E1140 Type 2 diabetes mellitus with diabetic neuropathy, unspecified: Secondary | ICD-10-CM | POA: Diagnosis not present

## 2023-11-05 DIAGNOSIS — G4733 Obstructive sleep apnea (adult) (pediatric): Secondary | ICD-10-CM | POA: Diagnosis not present

## 2023-11-05 DIAGNOSIS — D509 Iron deficiency anemia, unspecified: Secondary | ICD-10-CM | POA: Diagnosis not present

## 2023-11-05 DIAGNOSIS — Z7901 Long term (current) use of anticoagulants: Secondary | ICD-10-CM | POA: Diagnosis not present

## 2023-11-05 DIAGNOSIS — S72002D Fracture of unspecified part of neck of left femur, subsequent encounter for closed fracture with routine healing: Secondary | ICD-10-CM | POA: Diagnosis not present

## 2023-11-05 DIAGNOSIS — I480 Paroxysmal atrial fibrillation: Secondary | ICD-10-CM | POA: Diagnosis not present

## 2023-11-13 DIAGNOSIS — N189 Chronic kidney disease, unspecified: Secondary | ICD-10-CM | POA: Diagnosis not present

## 2023-11-13 DIAGNOSIS — E1140 Type 2 diabetes mellitus with diabetic neuropathy, unspecified: Secondary | ICD-10-CM | POA: Diagnosis not present

## 2023-11-13 DIAGNOSIS — Z9181 History of falling: Secondary | ICD-10-CM | POA: Diagnosis not present

## 2023-11-13 DIAGNOSIS — E1143 Type 2 diabetes mellitus with diabetic autonomic (poly)neuropathy: Secondary | ICD-10-CM | POA: Diagnosis not present

## 2023-11-13 DIAGNOSIS — Z7901 Long term (current) use of anticoagulants: Secondary | ICD-10-CM | POA: Diagnosis not present

## 2023-11-13 DIAGNOSIS — C50912 Malignant neoplasm of unspecified site of left female breast: Secondary | ICD-10-CM | POA: Diagnosis not present

## 2023-11-13 DIAGNOSIS — K3184 Gastroparesis: Secondary | ICD-10-CM | POA: Diagnosis not present

## 2023-11-13 DIAGNOSIS — I5032 Chronic diastolic (congestive) heart failure: Secondary | ICD-10-CM | POA: Diagnosis not present

## 2023-11-13 DIAGNOSIS — G4733 Obstructive sleep apnea (adult) (pediatric): Secondary | ICD-10-CM | POA: Diagnosis not present

## 2023-11-13 DIAGNOSIS — L89613 Pressure ulcer of right heel, stage 3: Secondary | ICD-10-CM | POA: Diagnosis not present

## 2023-11-13 DIAGNOSIS — I480 Paroxysmal atrial fibrillation: Secondary | ICD-10-CM | POA: Diagnosis not present

## 2023-11-13 DIAGNOSIS — D631 Anemia in chronic kidney disease: Secondary | ICD-10-CM | POA: Diagnosis not present

## 2023-11-13 DIAGNOSIS — Z7984 Long term (current) use of oral hypoglycemic drugs: Secondary | ICD-10-CM | POA: Diagnosis not present

## 2023-11-13 DIAGNOSIS — I130 Hypertensive heart and chronic kidney disease with heart failure and stage 1 through stage 4 chronic kidney disease, or unspecified chronic kidney disease: Secondary | ICD-10-CM | POA: Diagnosis not present

## 2023-11-13 DIAGNOSIS — S72002D Fracture of unspecified part of neck of left femur, subsequent encounter for closed fracture with routine healing: Secondary | ICD-10-CM | POA: Diagnosis not present

## 2023-11-13 DIAGNOSIS — D509 Iron deficiency anemia, unspecified: Secondary | ICD-10-CM | POA: Diagnosis not present

## 2023-11-13 DIAGNOSIS — I429 Cardiomyopathy, unspecified: Secondary | ICD-10-CM | POA: Diagnosis not present

## 2023-11-13 DIAGNOSIS — E1122 Type 2 diabetes mellitus with diabetic chronic kidney disease: Secondary | ICD-10-CM | POA: Diagnosis not present

## 2023-11-13 DIAGNOSIS — E785 Hyperlipidemia, unspecified: Secondary | ICD-10-CM | POA: Diagnosis not present

## 2023-11-13 DIAGNOSIS — E440 Moderate protein-calorie malnutrition: Secondary | ICD-10-CM | POA: Diagnosis not present

## 2023-11-14 DIAGNOSIS — L97412 Non-pressure chronic ulcer of right heel and midfoot with fat layer exposed: Secondary | ICD-10-CM | POA: Diagnosis not present

## 2023-11-14 DIAGNOSIS — R2689 Other abnormalities of gait and mobility: Secondary | ICD-10-CM | POA: Diagnosis not present

## 2023-11-14 DIAGNOSIS — I509 Heart failure, unspecified: Secondary | ICD-10-CM | POA: Diagnosis not present

## 2023-11-14 DIAGNOSIS — L89613 Pressure ulcer of right heel, stage 3: Secondary | ICD-10-CM | POA: Diagnosis not present

## 2023-11-14 DIAGNOSIS — R262 Difficulty in walking, not elsewhere classified: Secondary | ICD-10-CM | POA: Diagnosis not present

## 2023-11-20 ENCOUNTER — Ambulatory Visit (INDEPENDENT_AMBULATORY_CARE_PROVIDER_SITE_OTHER): Admitting: Internal Medicine

## 2023-11-20 ENCOUNTER — Encounter: Payer: Self-pay | Admitting: Internal Medicine

## 2023-11-20 VITALS — BP 100/71 | HR 86 | Ht 64.0 in | Wt 140.8 lb

## 2023-11-20 DIAGNOSIS — I480 Paroxysmal atrial fibrillation: Secondary | ICD-10-CM | POA: Diagnosis not present

## 2023-11-20 DIAGNOSIS — I10 Essential (primary) hypertension: Secondary | ICD-10-CM | POA: Diagnosis not present

## 2023-11-20 DIAGNOSIS — N1831 Chronic kidney disease, stage 3a: Secondary | ICD-10-CM

## 2023-11-20 DIAGNOSIS — E782 Mixed hyperlipidemia: Secondary | ICD-10-CM | POA: Diagnosis not present

## 2023-11-20 DIAGNOSIS — L97412 Non-pressure chronic ulcer of right heel and midfoot with fat layer exposed: Secondary | ICD-10-CM | POA: Diagnosis not present

## 2023-11-20 DIAGNOSIS — E1140 Type 2 diabetes mellitus with diabetic neuropathy, unspecified: Secondary | ICD-10-CM

## 2023-11-20 NOTE — Assessment & Plan Note (Signed)
 Last 2 BMPs reviewed, GFR around 50 On Jardiance  for CHF and DM Avoid nephrotoxic agents Maintain adequate hydration

## 2023-11-20 NOTE — Patient Instructions (Signed)
Please continue to take medications as prescribed.  Please continue to follow low carb diet and ambulate as tolerated. 

## 2023-11-20 NOTE — Assessment & Plan Note (Signed)
 Right heel pressure ulcer healing well now Has dressing in place Followed by [facility] wound clinic Tylenol  for mild to moderate pain, tramadol  for severe pain

## 2023-11-20 NOTE — Assessment & Plan Note (Addendum)
 Lab Results  Component Value Date   HGBA1C 5.4 08/16/2023   Well-controlled Associated with HTN and HLD On Metformin  500 mg QD  DCed Rybelsus  7 mg due to concern for weight loss and fatigue -considering rapid weight gain and hyperglycemia, may need to restart Rybelsus , will check HbA1c On Jardiance  10 mg QD for cardiomyopathy Advised to follow diabetic diet On statin F/u CMP, HbA1c and lipid panel Diabetic eye exam: Advised to follow up with Ophthalmology for diabetic eye exam  Mild numbness of LE likely due to diabetic neuropathy, avoid adding gabapentin due to sedative effect for now

## 2023-11-20 NOTE — Assessment & Plan Note (Addendum)
 BP Readings from Last 1 Encounters:  11/20/23 100/71   Well-controlled with metoprolol  100 mg BID DCed Losartan  due to low normal BP and fatigue Recently discontinued amlodipine , HCTZ due to hypotension and AKI Counseled for compliance with the medications Advised DASH diet and moderate exercise/walking as tolerated

## 2023-11-20 NOTE — Assessment & Plan Note (Signed)
CHADS-VASc - 4.  On Eliquis for [facility] On metoprolol 100 mg BID for rate control Followed by cardiology

## 2023-11-20 NOTE — Assessment & Plan Note (Signed)
On Lipitor 10 mg once daily Check lipid profile

## 2023-11-20 NOTE — Progress Notes (Signed)
 Established Patient Office Visit  Subjective:  Patient ID: Lisa Thornton, female    DOB: May 27, 1955  Age: 68 y.o. MRN: [id number]  CC:  Chief Complaint  Patient presents with   Diabetes   Hypertension    HPI Lisa Thornton is a 68 y.o. female with past medical history of HTN, A. Fib., type 2 DM and breast ca s/p mastectomy and chemotherapy who presents for f/u of her chronic medical conditions.  Type II DM: Her last HbA1c was 5.4 in 05/25.  She takes metformin  500 mg QD, and Jardiance  10 mg QD currently. She has stopped  Rybelsus  7 mg QD as advised in the last visit.  She has gained about 16 lbs weight since the last visit. She has noticed improvement in her diarrhea now since switching metformin  formulation.  Her blood glucose has been around 150-170 most of the time now. Denies any polyuria or polyphagia. Denies any dysuria or hematuria.  HTN: BP is low normal today. Takes medications regularly. Patient denies headache, dizziness, chest pain, dyspnea or palpitations.   Atrial fibrillation: She has had atrial fibrillation since 2023.  She is on Eliquis  5 mg BID and Metoprolol  100 mg BID, followed by cardiology. She denies any dizziness or palpitations currently.  Denies any active bleeding.  She has noticed improvement in sleep quality and appetite with mirtazapine .  Right heel wound: She is followed by wound care clinic in [place] now.  Wound has been healing well overall.  She has had wound debridement.  Denies any fever or chills.  She has severe pain of right heel at times, has tried taking Tylenol  without much relief.  She has tramadol  for severe pain.  Past Medical History:  Diagnosis Date   Anemia 12/31/2017   Cancer (HCC)    left breast cancer   Chest pain    Diabetes mellitus without complication (HCC)    Hypertension     Past Surgical History:  Procedure Laterality Date   COLONOSCOPY WITH PROPOFOL  N/A 09/27/2021   Procedure: COLONOSCOPY WITH PROPOFOL ;  Surgeon: Eartha Angelia Sieving, MD;  Location: [facility];  Service: Gastroenterology;  Laterality: N/A;  945   MASTECTOMY MODIFIED RADICAL Left 12/31/2017   Procedure: LEFT MODIFIED RADICAL MASTECTOMY;  Surgeon: Mavis Anes, MD;  Location: [facility];  Service: General;  Laterality: Left;   PERCUTANEOUS PINNING Left 05/17/2023   Procedure: LEFT HIP PINNING;  Surgeon: Beverley Evalene JONETTA, MD;  Location: [facility] OR;  Service: Orthopedics;  Laterality: Left;   POLYPECTOMY  09/27/2021   Procedure: POLYPECTOMY;  Surgeon: Eartha Angelia Sieving, MD;  Location: [facility];  Service: Gastroenterology;;   PORTACATH PLACEMENT Right 06/27/2017   Procedure: INSERTION PORT-A-CATH;  Surgeon: Mavis Anes, MD;  Location: [facility];  Service: General;  Laterality: Right;    Family History  Problem Relation Age of Onset   Breast cancer Mother    Thyroid  disease Mother    Heart disease Mother    Heart attack Father    Heart attack Sister    Hypertension Sister    Heart attack Brother    Cancer Sister    Stroke Brother    Alzheimer's disease Maternal Aunt     Social History   Socioeconomic History   Marital status: Divorced    Spouse name: Not on file   Number of children: 2   Years of education: 10   Highest education level: 10th grade  Occupational History   Occupation: Psychologist, 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  Tobacco Use  Smoking status: Never   Smokeless tobacco: Never  Vaping Use   Vaping status: Never Used  Substance and Sexual Activity   Alcohol use: No   Drug use: No   Sexual activity: Not Currently  Other Topics Concern   Not on file  Social History Narrative   Lives with sister   2 children   Dog: Cozie      Enjoys: puzzles, sewing, and walk      Diet: eats all food groups outside: leafy greens    Caffeine: limited, sweet tea   Water : 6-8 cups daily       No car; does have license, wears seat belt   Smoke detector at home   [name] area    Social Drivers of Health   Financial Resource Strain: Low Risk   (03/29/2021)   Overall Financial Resource Strain (CARDIA)    Difficulty of Paying Living Expenses: Not hard at all  Food Insecurity: No Food Insecurity (05/17/2023)   Hunger Vital Sign    Worried About Running Out of Food in the Last Year: Never true    Ran Out of Food in the Last Year: Never true  Transportation Needs: No Transportation Needs (05/17/2023)   PRAPARE - Administrator, Civil Service (Medical): No    Lack of Transportation (Non-Medical): No  Physical Activity: Insufficiently Active (03/29/2021)   Exercise Vital Sign    Days of Exercise per Week: 7 days    Minutes of Exercise per Session: 20 min  Stress: No Stress Concern Present (03/29/2021)   [employer] of Occupational Health - Occupational Stress Questionnaire    Feeling of Stress : Not at all  Social Connections: Socially Isolated (05/17/2023)   Social Connection and Isolation Panel    Frequency of Communication with Friends and Family: More than three times a week    Frequency of Social Gatherings with Friends and Family: Twice a week    Attends Religious Services: Never    Database administrator or Organizations: No    Attends Banker Meetings: Never    Marital Status: Divorced  Catering manager Violence: Not At Risk (05/17/2023)   Humiliation, Afraid, Rape, and Kick questionnaire    Fear of Current or Ex-Partner: No    Emotionally Abused: No    Physically Abused: No    Sexually Abused: No    Outpatient Medications Prior to Visit  Medication Sig Dispense Refill   ACCU-CHEK GUIDE TEST test strip USE 1 STRIP TO CHECK GLUCOSE THREE TIMES DAILY MORNING, NOON, AND AT BEDTIME AS DIRECTED 100 each 0   Accu-Chek Softclix Lancets lancets Use as instructed 100 each 0   anastrozole  (ARIMIDEX ) 1 MG tablet Take 1 tablet (1 mg total) by mouth daily. 90 tablet 1   apixaban  (ELIQUIS ) 5 MG TABS tablet Take 1 tablet (5 mg total) by mouth 2 (two) times daily. 180 tablet 3   atorvastatin  (LIPITOR) 10  MG tablet Take 1 tablet (10 mg total) by mouth at bedtime. 90 tablet 3   blood glucose meter kit and supplies Dispense based on patient and insurance preference. Use up to four times daily as directed. (FOR ICD-10 E10.9, E11.9). 1 each 0   cetirizine (ZYRTEC) 10 MG tablet Take 10 mg by mouth daily.     chlorhexidine  (HIBICLENS ) 4 % external liquid Apply 15 mLs (1 Application total) topically as directed for 30 doses. Use as directed daily for 5 days every other week for 6 weeks. 946 mL 1  Cholecalciferol  (VITAMIN D -3 PO) Take 1 tablet by mouth daily.     empagliflozin  (JARDIANCE ) 10 MG TABS tablet Take 1 tablet (10 mg total) by mouth daily. 90 tablet 3   feeding supplement (ENSURE ENLIVE / ENSURE PLUS) LIQD Take 237 mLs by mouth 2 (two) times daily between meals.     furosemide  (LASIX ) 40 MG tablet Take 0.5 tablets (20 mg total) by mouth daily as needed (swelling). 30 tablet 3   lactobacillus (FLORANEX/LACTINEX) PACK Take 1 packet by mouth 3 (three) times daily with meals.     magnesium  oxide (MAG-OX) 400 (240 Mg) MG tablet Take 1 tablet (400 mg total) by mouth daily. (Patient taking differently: Take 400 mg by mouth at bedtime.)     metFORMIN  (GLUCOPHAGE -XR) 500 MG 24 hr tablet Take 1 tablet (500 mg total) by mouth daily with breakfast. 90 tablet 3   metoprolol  succinate (TOPROL -XL) 100 MG 24 hr tablet Take 1 tablet (100 mg total) by mouth 2 (two) times daily. Take with or immediately following a meal. 180 tablet 3   mirtazapine  (REMERON ) 7.5 MG tablet Take 1 tablet (7.5 mg total) by mouth at bedtime. 30 tablet 3   mupirocin  ointment (BACTROBAN ) 2 % Apply 1 Application topically 2 (two) times daily.     ondansetron  (ZOFRAN ) 4 MG tablet Take 1 tablet (4 mg total) by mouth every 8 (eight) hours as needed for nausea or vomiting. 20 tablet 0   OVER THE COUNTER MEDICATION Take 1 tablet by mouth at bedtime. OTC stomach pill     traMADol  (ULTRAM ) 50 MG tablet Take 1 tablet (50 mg total) by mouth every  12 (twelve) hours as needed. 30 tablet 1   Semaglutide  (RYBELSUS ) 7 MG TABS Take 1 tablet (7 mg total) by mouth daily. 90 tablet 1   No facility-administered medications prior to visit.    No Known Allergies  ROS Review of Systems  Constitutional:  Positive for fatigue. Negative for chills and fever.  HENT:  Negative for congestion, sinus pressure, sinus pain and sore throat.   Eyes:  Negative for pain and discharge.  Respiratory:  Negative for cough and shortness of breath.   Cardiovascular:  Negative for chest pain and palpitations.  Gastrointestinal:  Positive for nausea. Negative for abdominal pain, diarrhea and vomiting.  Endocrine: Negative for polydipsia and polyuria.  Genitourinary:  Negative for dysuria, frequency and hematuria.  Musculoskeletal:  Negative for neck pain and neck stiffness.  Skin:  Positive for wound. Negative for rash.  Neurological:  Negative for dizziness and weakness.  Psychiatric/Behavioral:  Negative for agitation and behavioral problems.       Objective:    Physical Exam Vitals reviewed.  Constitutional:      General: She is not in acute distress.    Appearance: She is not diaphoretic.     Comments: In wheelchair  HENT:     Head: Normocephalic and atraumatic.     Nose: Nose normal. No congestion.     Mouth/Throat:     Mouth: Mucous membranes are moist.     Pharynx: No posterior oropharyngeal erythema.  Eyes:     General: No scleral icterus.    Extraocular Movements: Extraocular movements intact.  Cardiovascular:     Rate and Rhythm: Normal rate and regular rhythm.     Heart sounds: Normal heart sounds. No murmur heard. Pulmonary:     Breath sounds: Normal breath sounds. No wheezing or rales.  Musculoskeletal:     Cervical back: Neck supple. No tenderness.  Right lower leg: No edema.     Left lower leg: No edema.  Skin:    General: Skin is warm.     Findings: No rash.     Comments: Pressure ulcer over right heel area, dressing in  place  Neurological:     General: No focal deficit present.     Mental Status: She is alert and oriented to person, place, and time.     Sensory: No sensory deficit.     Motor: Weakness (B/l LE - 3/5) present.  Psychiatric:        Mood and Affect: Mood normal.        Behavior: Behavior normal.     BP 100/71   Pulse 86   Ht 5' 4 (1.626 m)   Wt 140 lb 12.8 oz (63.9 kg)   SpO2 93%   BMI 24.17 kg/m  Wt Readings from Last 3 Encounters:  11/20/23 140 lb 12.8 oz (63.9 kg)  09/24/23 135 lb 2.3 oz (61.3 kg)  08/22/23 124 lb (56.2 kg)    Lab Results  Component Value Date   TSH 1.250 08/16/2023   Lab Results  Component Value Date   WBC 8.0 09/14/2023   HGB 11.6 (L) 09/14/2023   HCT 36.1 09/14/2023   MCV 93.5 09/14/2023   PLT 212 09/14/2023   Lab Results  Component Value Date   NA 139 09/14/2023   K 4.3 09/14/2023   CO2 27 09/14/2023   GLUCOSE 117 (H) 09/14/2023   BUN 21 09/14/2023   CREATININE 1.20 (H) 09/14/2023   BILITOT 0.5 09/14/2023   ALKPHOS 89 09/14/2023   AST 30 09/14/2023   ALT 19 09/14/2023   PROT 7.4 09/14/2023   ALBUMIN 3.0 (L) 09/14/2023   CALCIUM  9.6 09/14/2023   ANIONGAP 7 09/14/2023   EGFR 53 (L) 08/16/2023   Lab Results  Component Value Date   CHOL 212 (H) 12/12/2021   Lab Results  Component Value Date   HDL 39 (L) 12/12/2021   Lab Results  Component Value Date   LDLCALC 136 (H) 12/12/2021   Lab Results  Component Value Date   TRIG 203 (H) 12/12/2021   Lab Results  Component Value Date   CHOLHDL 5.4 (H) 12/12/2021   Lab Results  Component Value Date   HGBA1C 5.4 08/16/2023      Assessment & Plan:   Problem List Items Addressed This Visit       Cardiovascular and Mediastinum   Atrial fibrillation (HCC) (Chronic)   CHADS-VASc - 4.  On Eliquis  for [facility] On metoprolol  100 mg BID for rate control Followed by cardiology      Essential hypertension - Primary   BP Readings from Last 1 Encounters:  11/20/23 100/71    Well-controlled with metoprolol  100 mg BID DCed Losartan  due to low normal BP and fatigue Recently discontinued amlodipine , HCTZ due to hypotension and AKI Counseled for compliance with the medications Advised DASH diet and moderate exercise/walking as tolerated      Relevant Orders   CMP14+EGFR   CBC with Differential/Platelet     Endocrine   Type 2 diabetes mellitus with diabetic neuropathy, unspecified (HCC) (Chronic)   Lab Results  Component Value Date   HGBA1C 5.4 08/16/2023   Well-controlled Associated with HTN and HLD On Metformin  500 mg QD  DCed Rybelsus  7 mg due to concern for weight loss and fatigue -considering rapid weight gain and hyperglycemia, may need to restart Rybelsus , will check HbA1c On Jardiance  10 mg QD  for cardiomyopathy Advised to follow diabetic diet On statin F/u CMP, HbA1c and lipid panel Diabetic eye exam: Advised to follow up with Ophthalmology for diabetic eye exam  Mild numbness of LE likely due to diabetic neuropathy, avoid adding gabapentin due to sedative effect for now      Relevant Orders   CMP14+EGFR   Hemoglobin A1c     Genitourinary   Stage 3a chronic kidney disease (HCC)   Last 2 BMPs reviewed, GFR around 50 On Jardiance  for CHF and DM Avoid nephrotoxic agents Maintain adequate hydration       Relevant Orders   Hemoglobin A1c   CBC with Differential/Platelet     Other   HLD (hyperlipidemia) (Chronic)   On Lipitor 10 mg once daily Check lipid profile      Relevant Orders   Lipid Profile   Skin ulcer of right heel with fat layer exposed (HCC)   Right heel pressure ulcer healing well now Has dressing in place [facility] wound clinic Tylenol  for mild to moderate pain, tramadol  for severe pain       No orders of the defined types were placed in this encounter.   Follow-up: Return in about 4 months (around 03/21/2024) for Annual physical.    Suzzane MARLA Blanch, MD

## 2023-11-21 ENCOUNTER — Ambulatory Visit: Payer: Self-pay | Admitting: Internal Medicine

## 2023-11-21 DIAGNOSIS — G4733 Obstructive sleep apnea (adult) (pediatric): Secondary | ICD-10-CM | POA: Diagnosis not present

## 2023-11-21 DIAGNOSIS — I5032 Chronic diastolic (congestive) heart failure: Secondary | ICD-10-CM | POA: Diagnosis not present

## 2023-11-21 DIAGNOSIS — I429 Cardiomyopathy, unspecified: Secondary | ICD-10-CM | POA: Diagnosis not present

## 2023-11-21 DIAGNOSIS — D509 Iron deficiency anemia, unspecified: Secondary | ICD-10-CM | POA: Diagnosis not present

## 2023-11-21 DIAGNOSIS — S72002D Fracture of unspecified part of neck of left femur, subsequent encounter for closed fracture with routine healing: Secondary | ICD-10-CM | POA: Diagnosis not present

## 2023-11-21 DIAGNOSIS — Z9181 History of falling: Secondary | ICD-10-CM | POA: Diagnosis not present

## 2023-11-21 DIAGNOSIS — D631 Anemia in chronic kidney disease: Secondary | ICD-10-CM | POA: Diagnosis not present

## 2023-11-21 DIAGNOSIS — L89613 Pressure ulcer of right heel, stage 3: Secondary | ICD-10-CM | POA: Diagnosis not present

## 2023-11-21 DIAGNOSIS — I480 Paroxysmal atrial fibrillation: Secondary | ICD-10-CM | POA: Diagnosis not present

## 2023-11-21 DIAGNOSIS — E1140 Type 2 diabetes mellitus with diabetic neuropathy, unspecified: Secondary | ICD-10-CM | POA: Diagnosis not present

## 2023-11-21 DIAGNOSIS — Z7984 Long term (current) use of oral hypoglycemic drugs: Secondary | ICD-10-CM | POA: Diagnosis not present

## 2023-11-21 DIAGNOSIS — E1143 Type 2 diabetes mellitus with diabetic autonomic (poly)neuropathy: Secondary | ICD-10-CM | POA: Diagnosis not present

## 2023-11-21 DIAGNOSIS — E440 Moderate protein-calorie malnutrition: Secondary | ICD-10-CM | POA: Diagnosis not present

## 2023-11-21 DIAGNOSIS — E785 Hyperlipidemia, unspecified: Secondary | ICD-10-CM | POA: Diagnosis not present

## 2023-11-21 DIAGNOSIS — Z7901 Long term (current) use of anticoagulants: Secondary | ICD-10-CM | POA: Diagnosis not present

## 2023-11-21 DIAGNOSIS — E1122 Type 2 diabetes mellitus with diabetic chronic kidney disease: Secondary | ICD-10-CM | POA: Diagnosis not present

## 2023-11-21 DIAGNOSIS — K3184 Gastroparesis: Secondary | ICD-10-CM | POA: Diagnosis not present

## 2023-11-21 DIAGNOSIS — N189 Chronic kidney disease, unspecified: Secondary | ICD-10-CM | POA: Diagnosis not present

## 2023-11-21 DIAGNOSIS — C50912 Malignant neoplasm of unspecified site of left female breast: Secondary | ICD-10-CM | POA: Diagnosis not present

## 2023-11-21 DIAGNOSIS — I130 Hypertensive heart and chronic kidney disease with heart failure and stage 1 through stage 4 chronic kidney disease, or unspecified chronic kidney disease: Secondary | ICD-10-CM | POA: Diagnosis not present

## 2023-11-21 LAB — CBC WITH DIFFERENTIAL/PLATELET
Basophils Absolute: 0 x10E3/uL (ref 0.0–0.2)
Basos: 1 %
EOS (ABSOLUTE): 0.3 x10E3/uL (ref 0.0–0.4)
Eos: 4 %
Hematocrit: 34.8 % (ref 34.0–46.6)
Hemoglobin: 11.4 g/dL (ref 11.1–15.9)
Immature Grans (Abs): 0 x10E3/uL (ref 0.0–0.1)
Immature Granulocytes: 0 %
Lymphocytes Absolute: 2.7 x10E3/uL (ref 0.7–3.1)
Lymphs: 32 %
MCH: 30.3 pg (ref 26.6–33.0)
MCHC: 32.8 g/dL (ref 31.5–35.7)
MCV: 93 fL (ref 79–97)
Monocytes Absolute: 0.4 x10E3/uL (ref 0.1–0.9)
Monocytes: 5 %
Neutrophils Absolute: 4.8 x10E3/uL (ref 1.4–7.0)
Neutrophils: 58 %
Platelets: 176 x10E3/uL (ref 150–450)
RBC: 3.76 x10E6/uL — ABNORMAL LOW (ref 3.77–5.28)
RDW: 13.2 % (ref 11.7–15.4)
WBC: 8.2 x10E3/uL (ref 3.4–10.8)

## 2023-11-21 LAB — CMP14+EGFR
ALT: 15 IU/L (ref 0–32)
AST: 24 IU/L (ref 0–40)
Albumin: 3.6 g/dL — ABNORMAL LOW (ref 3.9–4.9)
Alkaline Phosphatase: 87 IU/L (ref 44–121)
BUN/Creatinine Ratio: 15 (ref 12–28)
BUN: 17 mg/dL (ref 8–27)
Bilirubin Total: 0.3 mg/dL (ref 0.0–1.2)
CO2: 23 mmol/L (ref 20–29)
Calcium: 9.5 mg/dL (ref 8.7–10.3)
Chloride: 102 mmol/L (ref 96–106)
Creatinine, Ser: 1.15 mg/dL — ABNORMAL HIGH (ref 0.57–1.00)
Globulin, Total: 3.5 g/dL (ref 1.5–4.5)
Glucose: 113 mg/dL — ABNORMAL HIGH (ref 70–99)
Potassium: 4.1 mmol/L (ref 3.5–5.2)
Sodium: 139 mmol/L (ref 134–144)
Total Protein: 7.1 g/dL (ref 6.0–8.5)
eGFR: 52 mL/min/1.73 — ABNORMAL LOW (ref >59)

## 2023-11-21 LAB — HEMOGLOBIN A1C
Est. average glucose Bld gHb Est-mCnc: 131 mg/dL
Hgb A1c MFr Bld: 6.2 % — ABNORMAL HIGH (ref 4.8–5.6)

## 2023-11-21 LAB — LIPID PANEL
Chol/HDL Ratio: 2.7 ratio (ref 0.0–4.4)
Cholesterol, Total: 131 mg/dL (ref 100–199)
HDL: 49 mg/dL (ref >39)
LDL Chol Calc (NIH): 67 mg/dL (ref 0–99)
Triglycerides: 74 mg/dL (ref 0–149)
VLDL Cholesterol Cal: 15 mg/dL (ref 5–40)

## 2023-11-28 DIAGNOSIS — Z9181 History of falling: Secondary | ICD-10-CM | POA: Diagnosis not present

## 2023-11-28 DIAGNOSIS — D509 Iron deficiency anemia, unspecified: Secondary | ICD-10-CM | POA: Diagnosis not present

## 2023-11-28 DIAGNOSIS — N189 Chronic kidney disease, unspecified: Secondary | ICD-10-CM | POA: Diagnosis not present

## 2023-11-28 DIAGNOSIS — E1140 Type 2 diabetes mellitus with diabetic neuropathy, unspecified: Secondary | ICD-10-CM | POA: Diagnosis not present

## 2023-11-28 DIAGNOSIS — I429 Cardiomyopathy, unspecified: Secondary | ICD-10-CM | POA: Diagnosis not present

## 2023-11-28 DIAGNOSIS — K3184 Gastroparesis: Secondary | ICD-10-CM | POA: Diagnosis not present

## 2023-11-28 DIAGNOSIS — C50912 Malignant neoplasm of unspecified site of left female breast: Secondary | ICD-10-CM | POA: Diagnosis not present

## 2023-11-28 DIAGNOSIS — L89613 Pressure ulcer of right heel, stage 3: Secondary | ICD-10-CM | POA: Diagnosis not present

## 2023-11-28 DIAGNOSIS — S72002D Fracture of unspecified part of neck of left femur, subsequent encounter for closed fracture with routine healing: Secondary | ICD-10-CM | POA: Diagnosis not present

## 2023-11-28 DIAGNOSIS — E1122 Type 2 diabetes mellitus with diabetic chronic kidney disease: Secondary | ICD-10-CM | POA: Diagnosis not present

## 2023-11-28 DIAGNOSIS — I480 Paroxysmal atrial fibrillation: Secondary | ICD-10-CM | POA: Diagnosis not present

## 2023-11-28 DIAGNOSIS — E785 Hyperlipidemia, unspecified: Secondary | ICD-10-CM | POA: Diagnosis not present

## 2023-11-28 DIAGNOSIS — E440 Moderate protein-calorie malnutrition: Secondary | ICD-10-CM | POA: Diagnosis not present

## 2023-11-28 DIAGNOSIS — Z7984 Long term (current) use of oral hypoglycemic drugs: Secondary | ICD-10-CM | POA: Diagnosis not present

## 2023-11-28 DIAGNOSIS — I5032 Chronic diastolic (congestive) heart failure: Secondary | ICD-10-CM | POA: Diagnosis not present

## 2023-11-28 DIAGNOSIS — Z7901 Long term (current) use of anticoagulants: Secondary | ICD-10-CM | POA: Diagnosis not present

## 2023-11-28 DIAGNOSIS — I130 Hypertensive heart and chronic kidney disease with heart failure and stage 1 through stage 4 chronic kidney disease, or unspecified chronic kidney disease: Secondary | ICD-10-CM | POA: Diagnosis not present

## 2023-11-28 DIAGNOSIS — D631 Anemia in chronic kidney disease: Secondary | ICD-10-CM | POA: Diagnosis not present

## 2023-11-28 DIAGNOSIS — G4733 Obstructive sleep apnea (adult) (pediatric): Secondary | ICD-10-CM | POA: Diagnosis not present

## 2023-11-28 DIAGNOSIS — E1143 Type 2 diabetes mellitus with diabetic autonomic (poly)neuropathy: Secondary | ICD-10-CM | POA: Diagnosis not present

## 2023-11-30 DIAGNOSIS — E1122 Type 2 diabetes mellitus with diabetic chronic kidney disease: Secondary | ICD-10-CM | POA: Diagnosis not present

## 2023-11-30 DIAGNOSIS — Z4789 Encounter for other orthopedic aftercare: Secondary | ICD-10-CM | POA: Diagnosis not present

## 2023-11-30 DIAGNOSIS — S52502D Unspecified fracture of the lower end of left radius, subsequent encounter for closed fracture with routine healing: Secondary | ICD-10-CM | POA: Diagnosis not present

## 2023-11-30 DIAGNOSIS — Z794 Long term (current) use of insulin: Secondary | ICD-10-CM | POA: Diagnosis not present

## 2023-11-30 DIAGNOSIS — S72002D Fracture of unspecified part of neck of left femur, subsequent encounter for closed fracture with routine healing: Secondary | ICD-10-CM | POA: Diagnosis not present

## 2023-12-04 DIAGNOSIS — D631 Anemia in chronic kidney disease: Secondary | ICD-10-CM | POA: Diagnosis not present

## 2023-12-04 DIAGNOSIS — G4733 Obstructive sleep apnea (adult) (pediatric): Secondary | ICD-10-CM | POA: Diagnosis not present

## 2023-12-04 DIAGNOSIS — I429 Cardiomyopathy, unspecified: Secondary | ICD-10-CM | POA: Diagnosis not present

## 2023-12-04 DIAGNOSIS — I480 Paroxysmal atrial fibrillation: Secondary | ICD-10-CM | POA: Diagnosis not present

## 2023-12-04 DIAGNOSIS — C50912 Malignant neoplasm of unspecified site of left female breast: Secondary | ICD-10-CM | POA: Diagnosis not present

## 2023-12-04 DIAGNOSIS — S72002D Fracture of unspecified part of neck of left femur, subsequent encounter for closed fracture with routine healing: Secondary | ICD-10-CM | POA: Diagnosis not present

## 2023-12-04 DIAGNOSIS — E440 Moderate protein-calorie malnutrition: Secondary | ICD-10-CM | POA: Diagnosis not present

## 2023-12-04 DIAGNOSIS — D509 Iron deficiency anemia, unspecified: Secondary | ICD-10-CM | POA: Diagnosis not present

## 2023-12-04 DIAGNOSIS — I130 Hypertensive heart and chronic kidney disease with heart failure and stage 1 through stage 4 chronic kidney disease, or unspecified chronic kidney disease: Secondary | ICD-10-CM | POA: Diagnosis not present

## 2023-12-04 DIAGNOSIS — E1140 Type 2 diabetes mellitus with diabetic neuropathy, unspecified: Secondary | ICD-10-CM | POA: Diagnosis not present

## 2023-12-04 DIAGNOSIS — L89613 Pressure ulcer of right heel, stage 3: Secondary | ICD-10-CM | POA: Diagnosis not present

## 2023-12-04 DIAGNOSIS — Z7901 Long term (current) use of anticoagulants: Secondary | ICD-10-CM | POA: Diagnosis not present

## 2023-12-04 DIAGNOSIS — K3184 Gastroparesis: Secondary | ICD-10-CM | POA: Diagnosis not present

## 2023-12-04 DIAGNOSIS — Z7984 Long term (current) use of oral hypoglycemic drugs: Secondary | ICD-10-CM | POA: Diagnosis not present

## 2023-12-04 DIAGNOSIS — I5032 Chronic diastolic (congestive) heart failure: Secondary | ICD-10-CM | POA: Diagnosis not present

## 2023-12-04 DIAGNOSIS — E1143 Type 2 diabetes mellitus with diabetic autonomic (poly)neuropathy: Secondary | ICD-10-CM | POA: Diagnosis not present

## 2023-12-04 DIAGNOSIS — N189 Chronic kidney disease, unspecified: Secondary | ICD-10-CM | POA: Diagnosis not present

## 2023-12-04 DIAGNOSIS — E1122 Type 2 diabetes mellitus with diabetic chronic kidney disease: Secondary | ICD-10-CM | POA: Diagnosis not present

## 2023-12-04 DIAGNOSIS — E785 Hyperlipidemia, unspecified: Secondary | ICD-10-CM | POA: Diagnosis not present

## 2023-12-04 DIAGNOSIS — Z9181 History of falling: Secondary | ICD-10-CM | POA: Diagnosis not present

## 2023-12-10 DIAGNOSIS — C50912 Malignant neoplasm of unspecified site of left female breast: Secondary | ICD-10-CM | POA: Diagnosis not present

## 2023-12-10 DIAGNOSIS — I5032 Chronic diastolic (congestive) heart failure: Secondary | ICD-10-CM | POA: Diagnosis not present

## 2023-12-10 DIAGNOSIS — Z9181 History of falling: Secondary | ICD-10-CM | POA: Diagnosis not present

## 2023-12-10 DIAGNOSIS — G4733 Obstructive sleep apnea (adult) (pediatric): Secondary | ICD-10-CM | POA: Diagnosis not present

## 2023-12-10 DIAGNOSIS — E1143 Type 2 diabetes mellitus with diabetic autonomic (poly)neuropathy: Secondary | ICD-10-CM | POA: Diagnosis not present

## 2023-12-10 DIAGNOSIS — L89613 Pressure ulcer of right heel, stage 3: Secondary | ICD-10-CM | POA: Diagnosis not present

## 2023-12-10 DIAGNOSIS — E785 Hyperlipidemia, unspecified: Secondary | ICD-10-CM | POA: Diagnosis not present

## 2023-12-10 DIAGNOSIS — E1140 Type 2 diabetes mellitus with diabetic neuropathy, unspecified: Secondary | ICD-10-CM | POA: Diagnosis not present

## 2023-12-10 DIAGNOSIS — N189 Chronic kidney disease, unspecified: Secondary | ICD-10-CM | POA: Diagnosis not present

## 2023-12-10 DIAGNOSIS — E1122 Type 2 diabetes mellitus with diabetic chronic kidney disease: Secondary | ICD-10-CM | POA: Diagnosis not present

## 2023-12-10 DIAGNOSIS — D631 Anemia in chronic kidney disease: Secondary | ICD-10-CM | POA: Diagnosis not present

## 2023-12-10 DIAGNOSIS — Z7984 Long term (current) use of oral hypoglycemic drugs: Secondary | ICD-10-CM | POA: Diagnosis not present

## 2023-12-10 DIAGNOSIS — I429 Cardiomyopathy, unspecified: Secondary | ICD-10-CM | POA: Diagnosis not present

## 2023-12-10 DIAGNOSIS — S72002D Fracture of unspecified part of neck of left femur, subsequent encounter for closed fracture with routine healing: Secondary | ICD-10-CM | POA: Diagnosis not present

## 2023-12-10 DIAGNOSIS — E440 Moderate protein-calorie malnutrition: Secondary | ICD-10-CM | POA: Diagnosis not present

## 2023-12-10 DIAGNOSIS — I130 Hypertensive heart and chronic kidney disease with heart failure and stage 1 through stage 4 chronic kidney disease, or unspecified chronic kidney disease: Secondary | ICD-10-CM | POA: Diagnosis not present

## 2023-12-10 DIAGNOSIS — I480 Paroxysmal atrial fibrillation: Secondary | ICD-10-CM | POA: Diagnosis not present

## 2023-12-10 DIAGNOSIS — Z7901 Long term (current) use of anticoagulants: Secondary | ICD-10-CM | POA: Diagnosis not present

## 2023-12-10 DIAGNOSIS — D509 Iron deficiency anemia, unspecified: Secondary | ICD-10-CM | POA: Diagnosis not present

## 2023-12-10 DIAGNOSIS — K3184 Gastroparesis: Secondary | ICD-10-CM | POA: Diagnosis not present

## 2023-12-15 DIAGNOSIS — R2689 Other abnormalities of gait and mobility: Secondary | ICD-10-CM | POA: Diagnosis not present

## 2023-12-15 DIAGNOSIS — R262 Difficulty in walking, not elsewhere classified: Secondary | ICD-10-CM | POA: Diagnosis not present

## 2023-12-15 DIAGNOSIS — I509 Heart failure, unspecified: Secondary | ICD-10-CM | POA: Diagnosis not present

## 2023-12-19 DIAGNOSIS — L89613 Pressure ulcer of right heel, stage 3: Secondary | ICD-10-CM | POA: Diagnosis not present

## 2023-12-19 DIAGNOSIS — L97412 Non-pressure chronic ulcer of right heel and midfoot with fat layer exposed: Secondary | ICD-10-CM | POA: Diagnosis not present

## 2023-12-19 DIAGNOSIS — L84 Corns and callosities: Secondary | ICD-10-CM | POA: Diagnosis not present

## 2023-12-19 DIAGNOSIS — L89619 Pressure ulcer of right heel, unspecified stage: Secondary | ICD-10-CM | POA: Diagnosis not present

## 2023-12-21 DIAGNOSIS — N189 Chronic kidney disease, unspecified: Secondary | ICD-10-CM | POA: Diagnosis not present

## 2023-12-21 DIAGNOSIS — I429 Cardiomyopathy, unspecified: Secondary | ICD-10-CM | POA: Diagnosis not present

## 2023-12-21 DIAGNOSIS — E1140 Type 2 diabetes mellitus with diabetic neuropathy, unspecified: Secondary | ICD-10-CM | POA: Diagnosis not present

## 2023-12-21 DIAGNOSIS — E440 Moderate protein-calorie malnutrition: Secondary | ICD-10-CM | POA: Diagnosis not present

## 2023-12-21 DIAGNOSIS — D631 Anemia in chronic kidney disease: Secondary | ICD-10-CM | POA: Diagnosis not present

## 2023-12-21 DIAGNOSIS — L89613 Pressure ulcer of right heel, stage 3: Secondary | ICD-10-CM | POA: Diagnosis not present

## 2023-12-21 DIAGNOSIS — D509 Iron deficiency anemia, unspecified: Secondary | ICD-10-CM | POA: Diagnosis not present

## 2023-12-21 DIAGNOSIS — I5032 Chronic diastolic (congestive) heart failure: Secondary | ICD-10-CM | POA: Diagnosis not present

## 2023-12-21 DIAGNOSIS — I480 Paroxysmal atrial fibrillation: Secondary | ICD-10-CM | POA: Diagnosis not present

## 2023-12-21 DIAGNOSIS — E1143 Type 2 diabetes mellitus with diabetic autonomic (poly)neuropathy: Secondary | ICD-10-CM | POA: Diagnosis not present

## 2023-12-21 DIAGNOSIS — I130 Hypertensive heart and chronic kidney disease with heart failure and stage 1 through stage 4 chronic kidney disease, or unspecified chronic kidney disease: Secondary | ICD-10-CM | POA: Diagnosis not present

## 2023-12-21 DIAGNOSIS — Z7901 Long term (current) use of anticoagulants: Secondary | ICD-10-CM | POA: Diagnosis not present

## 2023-12-21 DIAGNOSIS — G4733 Obstructive sleep apnea (adult) (pediatric): Secondary | ICD-10-CM | POA: Diagnosis not present

## 2023-12-21 DIAGNOSIS — E785 Hyperlipidemia, unspecified: Secondary | ICD-10-CM | POA: Diagnosis not present

## 2023-12-21 DIAGNOSIS — Z7984 Long term (current) use of oral hypoglycemic drugs: Secondary | ICD-10-CM | POA: Diagnosis not present

## 2023-12-21 DIAGNOSIS — K3184 Gastroparesis: Secondary | ICD-10-CM | POA: Diagnosis not present

## 2023-12-21 DIAGNOSIS — E1122 Type 2 diabetes mellitus with diabetic chronic kidney disease: Secondary | ICD-10-CM | POA: Diagnosis not present

## 2023-12-21 DIAGNOSIS — Z9181 History of falling: Secondary | ICD-10-CM | POA: Diagnosis not present

## 2023-12-21 DIAGNOSIS — C50912 Malignant neoplasm of unspecified site of left female breast: Secondary | ICD-10-CM | POA: Diagnosis not present

## 2023-12-21 DIAGNOSIS — S72002D Fracture of unspecified part of neck of left femur, subsequent encounter for closed fracture with routine healing: Secondary | ICD-10-CM | POA: Diagnosis not present

## 2023-12-24 DIAGNOSIS — E1122 Type 2 diabetes mellitus with diabetic chronic kidney disease: Secondary | ICD-10-CM | POA: Diagnosis not present

## 2023-12-24 DIAGNOSIS — N189 Chronic kidney disease, unspecified: Secondary | ICD-10-CM | POA: Diagnosis not present

## 2023-12-24 DIAGNOSIS — E1143 Type 2 diabetes mellitus with diabetic autonomic (poly)neuropathy: Secondary | ICD-10-CM | POA: Diagnosis not present

## 2023-12-24 DIAGNOSIS — G4733 Obstructive sleep apnea (adult) (pediatric): Secondary | ICD-10-CM | POA: Diagnosis not present

## 2023-12-24 DIAGNOSIS — L89613 Pressure ulcer of right heel, stage 3: Secondary | ICD-10-CM | POA: Diagnosis not present

## 2023-12-24 DIAGNOSIS — I429 Cardiomyopathy, unspecified: Secondary | ICD-10-CM | POA: Diagnosis not present

## 2023-12-24 DIAGNOSIS — S72002D Fracture of unspecified part of neck of left femur, subsequent encounter for closed fracture with routine healing: Secondary | ICD-10-CM | POA: Diagnosis not present

## 2023-12-24 DIAGNOSIS — Z7901 Long term (current) use of anticoagulants: Secondary | ICD-10-CM | POA: Diagnosis not present

## 2023-12-24 DIAGNOSIS — I130 Hypertensive heart and chronic kidney disease with heart failure and stage 1 through stage 4 chronic kidney disease, or unspecified chronic kidney disease: Secondary | ICD-10-CM | POA: Diagnosis not present

## 2023-12-24 DIAGNOSIS — K3184 Gastroparesis: Secondary | ICD-10-CM | POA: Diagnosis not present

## 2023-12-24 DIAGNOSIS — C50912 Malignant neoplasm of unspecified site of left female breast: Secondary | ICD-10-CM | POA: Diagnosis not present

## 2023-12-24 DIAGNOSIS — I5032 Chronic diastolic (congestive) heart failure: Secondary | ICD-10-CM | POA: Diagnosis not present

## 2023-12-24 DIAGNOSIS — Z9181 History of falling: Secondary | ICD-10-CM | POA: Diagnosis not present

## 2023-12-24 DIAGNOSIS — I480 Paroxysmal atrial fibrillation: Secondary | ICD-10-CM | POA: Diagnosis not present

## 2023-12-24 DIAGNOSIS — Z7984 Long term (current) use of oral hypoglycemic drugs: Secondary | ICD-10-CM | POA: Diagnosis not present

## 2023-12-24 DIAGNOSIS — E440 Moderate protein-calorie malnutrition: Secondary | ICD-10-CM | POA: Diagnosis not present

## 2023-12-24 DIAGNOSIS — D509 Iron deficiency anemia, unspecified: Secondary | ICD-10-CM | POA: Diagnosis not present

## 2023-12-24 DIAGNOSIS — D631 Anemia in chronic kidney disease: Secondary | ICD-10-CM | POA: Diagnosis not present

## 2023-12-24 DIAGNOSIS — E1140 Type 2 diabetes mellitus with diabetic neuropathy, unspecified: Secondary | ICD-10-CM | POA: Diagnosis not present

## 2023-12-24 DIAGNOSIS — E785 Hyperlipidemia, unspecified: Secondary | ICD-10-CM | POA: Diagnosis not present

## 2023-12-31 DIAGNOSIS — Z4789 Encounter for other orthopedic aftercare: Secondary | ICD-10-CM | POA: Diagnosis not present

## 2023-12-31 DIAGNOSIS — S72002D Fracture of unspecified part of neck of left femur, subsequent encounter for closed fracture with routine healing: Secondary | ICD-10-CM | POA: Diagnosis not present

## 2023-12-31 DIAGNOSIS — S52502D Unspecified fracture of the lower end of left radius, subsequent encounter for closed fracture with routine healing: Secondary | ICD-10-CM | POA: Diagnosis not present

## 2023-12-31 DIAGNOSIS — E1122 Type 2 diabetes mellitus with diabetic chronic kidney disease: Secondary | ICD-10-CM | POA: Diagnosis not present

## 2023-12-31 DIAGNOSIS — Z794 Long term (current) use of insulin: Secondary | ICD-10-CM | POA: Diagnosis not present

## 2024-01-01 DIAGNOSIS — Z7901 Long term (current) use of anticoagulants: Secondary | ICD-10-CM | POA: Diagnosis not present

## 2024-01-01 DIAGNOSIS — E1140 Type 2 diabetes mellitus with diabetic neuropathy, unspecified: Secondary | ICD-10-CM | POA: Diagnosis not present

## 2024-01-01 DIAGNOSIS — E440 Moderate protein-calorie malnutrition: Secondary | ICD-10-CM | POA: Diagnosis not present

## 2024-01-01 DIAGNOSIS — K3184 Gastroparesis: Secondary | ICD-10-CM | POA: Diagnosis not present

## 2024-01-01 DIAGNOSIS — N189 Chronic kidney disease, unspecified: Secondary | ICD-10-CM | POA: Diagnosis not present

## 2024-01-01 DIAGNOSIS — E785 Hyperlipidemia, unspecified: Secondary | ICD-10-CM | POA: Diagnosis not present

## 2024-01-01 DIAGNOSIS — D631 Anemia in chronic kidney disease: Secondary | ICD-10-CM | POA: Diagnosis not present

## 2024-01-01 DIAGNOSIS — I480 Paroxysmal atrial fibrillation: Secondary | ICD-10-CM | POA: Diagnosis not present

## 2024-01-01 DIAGNOSIS — D509 Iron deficiency anemia, unspecified: Secondary | ICD-10-CM | POA: Diagnosis not present

## 2024-01-01 DIAGNOSIS — I429 Cardiomyopathy, unspecified: Secondary | ICD-10-CM | POA: Diagnosis not present

## 2024-01-01 DIAGNOSIS — I5032 Chronic diastolic (congestive) heart failure: Secondary | ICD-10-CM | POA: Diagnosis not present

## 2024-01-01 DIAGNOSIS — L89613 Pressure ulcer of right heel, stage 3: Secondary | ICD-10-CM | POA: Diagnosis not present

## 2024-01-01 DIAGNOSIS — G4733 Obstructive sleep apnea (adult) (pediatric): Secondary | ICD-10-CM | POA: Diagnosis not present

## 2024-01-01 DIAGNOSIS — S72002D Fracture of unspecified part of neck of left femur, subsequent encounter for closed fracture with routine healing: Secondary | ICD-10-CM | POA: Diagnosis not present

## 2024-01-01 DIAGNOSIS — E1122 Type 2 diabetes mellitus with diabetic chronic kidney disease: Secondary | ICD-10-CM | POA: Diagnosis not present

## 2024-01-01 DIAGNOSIS — I130 Hypertensive heart and chronic kidney disease with heart failure and stage 1 through stage 4 chronic kidney disease, or unspecified chronic kidney disease: Secondary | ICD-10-CM | POA: Diagnosis not present

## 2024-01-01 DIAGNOSIS — E1143 Type 2 diabetes mellitus with diabetic autonomic (poly)neuropathy: Secondary | ICD-10-CM | POA: Diagnosis not present

## 2024-01-01 DIAGNOSIS — C50912 Malignant neoplasm of unspecified site of left female breast: Secondary | ICD-10-CM | POA: Diagnosis not present

## 2024-01-01 DIAGNOSIS — Z7984 Long term (current) use of oral hypoglycemic drugs: Secondary | ICD-10-CM | POA: Diagnosis not present

## 2024-01-01 DIAGNOSIS — Z9181 History of falling: Secondary | ICD-10-CM | POA: Diagnosis not present

## 2024-01-03 DIAGNOSIS — E785 Hyperlipidemia, unspecified: Secondary | ICD-10-CM | POA: Diagnosis not present

## 2024-01-03 DIAGNOSIS — D631 Anemia in chronic kidney disease: Secondary | ICD-10-CM | POA: Diagnosis not present

## 2024-01-03 DIAGNOSIS — E1143 Type 2 diabetes mellitus with diabetic autonomic (poly)neuropathy: Secondary | ICD-10-CM | POA: Diagnosis not present

## 2024-01-03 DIAGNOSIS — I5032 Chronic diastolic (congestive) heart failure: Secondary | ICD-10-CM | POA: Diagnosis not present

## 2024-01-03 DIAGNOSIS — I480 Paroxysmal atrial fibrillation: Secondary | ICD-10-CM | POA: Diagnosis not present

## 2024-01-03 DIAGNOSIS — K3184 Gastroparesis: Secondary | ICD-10-CM | POA: Diagnosis not present

## 2024-01-03 DIAGNOSIS — E1140 Type 2 diabetes mellitus with diabetic neuropathy, unspecified: Secondary | ICD-10-CM | POA: Diagnosis not present

## 2024-01-03 DIAGNOSIS — I429 Cardiomyopathy, unspecified: Secondary | ICD-10-CM | POA: Diagnosis not present

## 2024-01-03 DIAGNOSIS — E1122 Type 2 diabetes mellitus with diabetic chronic kidney disease: Secondary | ICD-10-CM | POA: Diagnosis not present

## 2024-01-03 DIAGNOSIS — N189 Chronic kidney disease, unspecified: Secondary | ICD-10-CM | POA: Diagnosis not present

## 2024-01-03 DIAGNOSIS — C50912 Malignant neoplasm of unspecified site of left female breast: Secondary | ICD-10-CM | POA: Diagnosis not present

## 2024-01-03 DIAGNOSIS — I130 Hypertensive heart and chronic kidney disease with heart failure and stage 1 through stage 4 chronic kidney disease, or unspecified chronic kidney disease: Secondary | ICD-10-CM | POA: Diagnosis not present

## 2024-01-03 DIAGNOSIS — Z9181 History of falling: Secondary | ICD-10-CM | POA: Diagnosis not present

## 2024-01-03 DIAGNOSIS — G4733 Obstructive sleep apnea (adult) (pediatric): Secondary | ICD-10-CM | POA: Diagnosis not present

## 2024-01-03 DIAGNOSIS — D509 Iron deficiency anemia, unspecified: Secondary | ICD-10-CM | POA: Diagnosis not present

## 2024-01-03 DIAGNOSIS — S72002D Fracture of unspecified part of neck of left femur, subsequent encounter for closed fracture with routine healing: Secondary | ICD-10-CM | POA: Diagnosis not present

## 2024-01-03 DIAGNOSIS — Z7901 Long term (current) use of anticoagulants: Secondary | ICD-10-CM | POA: Diagnosis not present

## 2024-01-03 DIAGNOSIS — E440 Moderate protein-calorie malnutrition: Secondary | ICD-10-CM | POA: Diagnosis not present

## 2024-01-03 DIAGNOSIS — Z7984 Long term (current) use of oral hypoglycemic drugs: Secondary | ICD-10-CM | POA: Diagnosis not present

## 2024-01-07 DIAGNOSIS — Z7901 Long term (current) use of anticoagulants: Secondary | ICD-10-CM | POA: Diagnosis not present

## 2024-01-07 DIAGNOSIS — G4733 Obstructive sleep apnea (adult) (pediatric): Secondary | ICD-10-CM | POA: Diagnosis not present

## 2024-01-07 DIAGNOSIS — N189 Chronic kidney disease, unspecified: Secondary | ICD-10-CM | POA: Diagnosis not present

## 2024-01-07 DIAGNOSIS — D509 Iron deficiency anemia, unspecified: Secondary | ICD-10-CM | POA: Diagnosis not present

## 2024-01-07 DIAGNOSIS — K3184 Gastroparesis: Secondary | ICD-10-CM | POA: Diagnosis not present

## 2024-01-07 DIAGNOSIS — Z9181 History of falling: Secondary | ICD-10-CM | POA: Diagnosis not present

## 2024-01-07 DIAGNOSIS — E785 Hyperlipidemia, unspecified: Secondary | ICD-10-CM | POA: Diagnosis not present

## 2024-01-07 DIAGNOSIS — I480 Paroxysmal atrial fibrillation: Secondary | ICD-10-CM | POA: Diagnosis not present

## 2024-01-07 DIAGNOSIS — D631 Anemia in chronic kidney disease: Secondary | ICD-10-CM | POA: Diagnosis not present

## 2024-01-07 DIAGNOSIS — E1143 Type 2 diabetes mellitus with diabetic autonomic (poly)neuropathy: Secondary | ICD-10-CM | POA: Diagnosis not present

## 2024-01-07 DIAGNOSIS — E1122 Type 2 diabetes mellitus with diabetic chronic kidney disease: Secondary | ICD-10-CM | POA: Diagnosis not present

## 2024-01-07 DIAGNOSIS — Z7984 Long term (current) use of oral hypoglycemic drugs: Secondary | ICD-10-CM | POA: Diagnosis not present

## 2024-01-07 DIAGNOSIS — E1140 Type 2 diabetes mellitus with diabetic neuropathy, unspecified: Secondary | ICD-10-CM | POA: Diagnosis not present

## 2024-01-07 DIAGNOSIS — E440 Moderate protein-calorie malnutrition: Secondary | ICD-10-CM | POA: Diagnosis not present

## 2024-01-07 DIAGNOSIS — I5032 Chronic diastolic (congestive) heart failure: Secondary | ICD-10-CM | POA: Diagnosis not present

## 2024-01-07 DIAGNOSIS — S72002D Fracture of unspecified part of neck of left femur, subsequent encounter for closed fracture with routine healing: Secondary | ICD-10-CM | POA: Diagnosis not present

## 2024-01-07 DIAGNOSIS — C50912 Malignant neoplasm of unspecified site of left female breast: Secondary | ICD-10-CM | POA: Diagnosis not present

## 2024-01-07 DIAGNOSIS — I130 Hypertensive heart and chronic kidney disease with heart failure and stage 1 through stage 4 chronic kidney disease, or unspecified chronic kidney disease: Secondary | ICD-10-CM | POA: Diagnosis not present

## 2024-01-07 DIAGNOSIS — I429 Cardiomyopathy, unspecified: Secondary | ICD-10-CM | POA: Diagnosis not present

## 2024-01-09 DIAGNOSIS — Z7901 Long term (current) use of anticoagulants: Secondary | ICD-10-CM | POA: Diagnosis not present

## 2024-01-09 DIAGNOSIS — N189 Chronic kidney disease, unspecified: Secondary | ICD-10-CM | POA: Diagnosis not present

## 2024-01-09 DIAGNOSIS — E1122 Type 2 diabetes mellitus with diabetic chronic kidney disease: Secondary | ICD-10-CM | POA: Diagnosis not present

## 2024-01-09 DIAGNOSIS — E1143 Type 2 diabetes mellitus with diabetic autonomic (poly)neuropathy: Secondary | ICD-10-CM | POA: Diagnosis not present

## 2024-01-09 DIAGNOSIS — K3184 Gastroparesis: Secondary | ICD-10-CM | POA: Diagnosis not present

## 2024-01-09 DIAGNOSIS — S72002D Fracture of unspecified part of neck of left femur, subsequent encounter for closed fracture with routine healing: Secondary | ICD-10-CM | POA: Diagnosis not present

## 2024-01-09 DIAGNOSIS — G4733 Obstructive sleep apnea (adult) (pediatric): Secondary | ICD-10-CM | POA: Diagnosis not present

## 2024-01-09 DIAGNOSIS — E440 Moderate protein-calorie malnutrition: Secondary | ICD-10-CM | POA: Diagnosis not present

## 2024-01-09 DIAGNOSIS — Z7984 Long term (current) use of oral hypoglycemic drugs: Secondary | ICD-10-CM | POA: Diagnosis not present

## 2024-01-09 DIAGNOSIS — D631 Anemia in chronic kidney disease: Secondary | ICD-10-CM | POA: Diagnosis not present

## 2024-01-09 DIAGNOSIS — I429 Cardiomyopathy, unspecified: Secondary | ICD-10-CM | POA: Diagnosis not present

## 2024-01-09 DIAGNOSIS — C50912 Malignant neoplasm of unspecified site of left female breast: Secondary | ICD-10-CM | POA: Diagnosis not present

## 2024-01-09 DIAGNOSIS — I480 Paroxysmal atrial fibrillation: Secondary | ICD-10-CM | POA: Diagnosis not present

## 2024-01-09 DIAGNOSIS — D509 Iron deficiency anemia, unspecified: Secondary | ICD-10-CM | POA: Diagnosis not present

## 2024-01-09 DIAGNOSIS — I5032 Chronic diastolic (congestive) heart failure: Secondary | ICD-10-CM | POA: Diagnosis not present

## 2024-01-09 DIAGNOSIS — E1140 Type 2 diabetes mellitus with diabetic neuropathy, unspecified: Secondary | ICD-10-CM | POA: Diagnosis not present

## 2024-01-09 DIAGNOSIS — Z9181 History of falling: Secondary | ICD-10-CM | POA: Diagnosis not present

## 2024-01-09 DIAGNOSIS — I130 Hypertensive heart and chronic kidney disease with heart failure and stage 1 through stage 4 chronic kidney disease, or unspecified chronic kidney disease: Secondary | ICD-10-CM | POA: Diagnosis not present

## 2024-01-09 DIAGNOSIS — E785 Hyperlipidemia, unspecified: Secondary | ICD-10-CM | POA: Diagnosis not present

## 2024-01-14 DIAGNOSIS — I509 Heart failure, unspecified: Secondary | ICD-10-CM | POA: Diagnosis not present

## 2024-01-14 DIAGNOSIS — R262 Difficulty in walking, not elsewhere classified: Secondary | ICD-10-CM | POA: Diagnosis not present

## 2024-01-14 DIAGNOSIS — R2689 Other abnormalities of gait and mobility: Secondary | ICD-10-CM | POA: Diagnosis not present

## 2024-01-30 DIAGNOSIS — S72002D Fracture of unspecified part of neck of left femur, subsequent encounter for closed fracture with routine healing: Secondary | ICD-10-CM | POA: Diagnosis not present

## 2024-01-30 DIAGNOSIS — Z4789 Encounter for other orthopedic aftercare: Secondary | ICD-10-CM | POA: Diagnosis not present

## 2024-01-30 DIAGNOSIS — S52502D Unspecified fracture of the lower end of left radius, subsequent encounter for closed fracture with routine healing: Secondary | ICD-10-CM | POA: Diagnosis not present

## 2024-01-30 DIAGNOSIS — Z794 Long term (current) use of insulin: Secondary | ICD-10-CM | POA: Diagnosis not present

## 2024-01-30 DIAGNOSIS — E1122 Type 2 diabetes mellitus with diabetic chronic kidney disease: Secondary | ICD-10-CM | POA: Diagnosis not present

## 2024-02-04 ENCOUNTER — Encounter: Payer: Self-pay | Admitting: Radiology

## 2024-02-08 ENCOUNTER — Ambulatory Visit: Admitting: Student

## 2024-02-19 ENCOUNTER — Other Ambulatory Visit: Payer: Self-pay | Admitting: Internal Medicine

## 2024-02-19 DIAGNOSIS — E1165 Type 2 diabetes mellitus with hyperglycemia: Secondary | ICD-10-CM

## 2024-02-19 DIAGNOSIS — E1140 Type 2 diabetes mellitus with diabetic neuropathy, unspecified: Secondary | ICD-10-CM

## 2024-03-17 ENCOUNTER — Ambulatory Visit: Payer: Self-pay

## 2024-03-17 NOTE — Telephone Encounter (Signed)
 FYI Only or Action Required?: FYI only for provider: appointment scheduled on 03/18/24.  Patient was last seen in primary care on 11/20/2023 by Tobie Suzzane POUR, MD.  Called Nurse Triage reporting Fall.  Symptoms began today.  Interventions attempted: Nothing.  Symptoms are: stable.  Triage Disposition: See PCP When Office is Open (Within 3 Days)  Patient/caregiver understands and will follow disposition?: Yes   Summary: legs kinda gave out last night, lowered herself to the ground,   Reason for Triage: patient legs kinda gave out last night, lowered herself to the ground, -pain in left leg below knee calf area -painful when flexing foot -no swelling,bruising,redness -hurt to stand on it -hurts when sitting       Reason for Disposition  MILD weakness (e.g., does not interfere with ability to work, go to school, normal activities)  (Exception: Mild weakness is a chronic symptom.)  Answer Assessment - Initial Assessment Questions Patient's aunt Pleasant called and she says the patient was home along yesterday and reported her legs gave out. She eased herself onto the floor on her bottom. Pleasant says she checked the patient over when she got home and no complaints pain last night, no swelling, no injury. This morning patient was having trouble getting dressed, leg hurting on the left side near the calf area. Pleasant helped the patient put her pants on over the left foot to pull over heel, patient said ow and flexing her foot caused the pain. She says the patient did not want to go to the UC, so [name] called to the office for advice. Advised no availability in office with her PCP or any provider. Scheduled with Dr. Bluford at [facility] tomorrow.   1. MECHANISM: How did the fall happen?     Legs gave out  2. DOMESTIC VIOLENCE AND ELDER ABUSE SCREENING: Did you fall because someone pushed you or tried to hurt you? If Yes, ask: Are you safe now?     No  3. ONSET: When did the fall  happen? (e.g., minutes, hours, or days ago)     Last night  4. LOCATION: What part of the body hit the ground? (e.g., back, buttocks, head, hips, knees, hands, head, stomach)     Left calf from knee down  5. INJURY: Did you hurt (injure) yourself when you fell? If Yes, ask: What did you injure? Tell me more about this? (e.g., body area; type of injury; pain severity)     No apparent injury when fall happened yesterday  6. PAIN: Is there any pain? If Yes, ask: How bad is the pain? (e.g., Scale 0-10; or none, mild,      With movement wiggling foot, move the leg it's sore  7. SIZE: For cuts, bruises, or swelling, ask: How large is it? (e.g., inches or centimeters)      No bruises, no cuts  9. OTHER SYMPTOMS: Do you have any other symptoms? (e.g., dizziness, fever, weakness; new-onset or worsening).      No  10. CAUSE: What do you think caused the fall (or falling)? (e.g., dizzy spell, tripped)       Unsure, no dizziness  Protocols used: [facility]

## 2024-03-18 ENCOUNTER — Ambulatory Visit (HOSPITAL_COMMUNITY)
Admission: RE | Admit: 2024-03-18 | Discharge: 2024-03-18 | Disposition: A | Source: Ambulatory Visit | Attending: Internal Medicine

## 2024-03-18 ENCOUNTER — Ambulatory Visit: Admitting: Family Medicine

## 2024-03-18 ENCOUNTER — Encounter: Payer: Self-pay | Admitting: Internal Medicine

## 2024-03-18 VITALS — BP 114/75 | HR 89 | Ht 64.0 in

## 2024-03-18 DIAGNOSIS — M25472 Effusion, left ankle: Secondary | ICD-10-CM | POA: Insufficient documentation

## 2024-03-18 DIAGNOSIS — N1831 Chronic kidney disease, stage 3a: Secondary | ICD-10-CM | POA: Diagnosis not present

## 2024-03-18 DIAGNOSIS — Z7984 Long term (current) use of oral hypoglycemic drugs: Secondary | ICD-10-CM

## 2024-03-18 DIAGNOSIS — I10 Essential (primary) hypertension: Secondary | ICD-10-CM

## 2024-03-18 DIAGNOSIS — R300 Dysuria: Secondary | ICD-10-CM

## 2024-03-18 DIAGNOSIS — Z23 Encounter for immunization: Secondary | ICD-10-CM | POA: Diagnosis not present

## 2024-03-18 DIAGNOSIS — E1140 Type 2 diabetes mellitus with diabetic neuropathy, unspecified: Secondary | ICD-10-CM | POA: Diagnosis not present

## 2024-03-18 DIAGNOSIS — I480 Paroxysmal atrial fibrillation: Secondary | ICD-10-CM

## 2024-03-18 DIAGNOSIS — N3000 Acute cystitis without hematuria: Secondary | ICD-10-CM

## 2024-03-18 DIAGNOSIS — I5032 Chronic diastolic (congestive) heart failure: Secondary | ICD-10-CM

## 2024-03-18 DIAGNOSIS — R5381 Other malaise: Secondary | ICD-10-CM | POA: Diagnosis not present

## 2024-03-18 MED ORDER — BLOOD GLUCOSE MONITORING SUPPL DEVI: 1.0000 | 0 refills | Status: AC

## 2024-03-18 MED ORDER — BLOOD GLUCOSE TEST VI STRP: 1.0000 | ORAL_STRIP | 3 refills | Status: AC

## 2024-03-18 MED ORDER — LANCETS MISC: 1.0000 | 3 refills | Status: AC

## 2024-03-18 NOTE — Progress Notes (Signed)
 Established Patient Office Visit  Subjective:  Patient ID: Lisa Thornton, female    DOB: 17-Jan-1956  Age: 68 y.o. MRN: [id number]  CC:  Chief Complaint  Patient presents with   Fall    Pt reports fall, leg is sore.legs kinda gave out 2 days ago, lowered herself to the ground    patient legs kinda gave out last night, lowered herself to the ground, -pain in left leg below knee calf area -painful when flexing foot -no swelling,bruising,redness -hurt to stand on it -hurts when sitting    HPI Lisa Thornton is a 68 y.o. female with past medical history of HTN, A. Fib., type 2 DM and breast ca s/p mastectomy and chemotherapy who presents for evaluation after a fall on 03/16/24.  Her daughter reports that she was feeling weak for the last few days prior to the fall.  She went to her grandkid's birthday party a day prior to her fall, and had been feeling fatigued after it.  Due to weakness of bilateral legs, she lowered herself to the ground and reports twisting her ankle and hitting her shoes.  She has left ankle pain and swelling since the fall.  Her pain is constant, sharp, nonradiating and worse with standing.  She has tried taking Tylenol  without much relief.  Of note, she has history of bilateral leg swelling due to HFpEF, but had been recently better with Lasix  every other day dosing.   Type II DM: Her last HbA1c was 6.2 in 08/25.  She takes metformin  500 mg QD, and Jardiance  10 mg QD currently. She has stopped  Rybelsus  7 mg QD as advised in the previous visit.  She has gained about 16 lbs weight since stopping Rybelsus . She has noticed improvement in her diarrhea now since switching metformin  formulation.  Her blood glucose has been around 150-170 most of the time now. Denies any polyuria or polyphagia. Denies any dysuria or hematuria.  HTN: BP is wnl today. Takes medications regularly. Patient denies headache, dizziness, chest pain, dyspnea or palpitations.   Atrial fibrillation: She  has had atrial fibrillation since 2023.  She is on Eliquis  5 mg BID and Metoprolol  100 mg BID, followed by cardiology. She denies any dizziness or palpitations currently.  Denies any active bleeding.  She has noticed improvement in sleep quality and appetite with mirtazapine .  Right heel wound: She is followed by wound care clinic in [place] now.  Wound has been healing well overall.  She has had wound debridement.  Denies any fever or chills.  She has severe pain of right heel at times, has tried taking Tylenol  without much relief.  She has tramadol  for severe pain, but has not taken it recently.  Past Medical History:  Diagnosis Date   Anemia 12/31/2017   Cancer (HCC)    left breast cancer   Chest pain    Diabetes mellitus without complication (HCC)    Hypertension     Past Surgical History:  Procedure Laterality Date   COLONOSCOPY WITH PROPOFOL  N/A 09/27/2021   Procedure: COLONOSCOPY WITH PROPOFOL ;  Surgeon: Eartha Angelia Sieving, MD;  Location: [facility];  Service: Gastroenterology;  Laterality: N/A;  945   MASTECTOMY MODIFIED RADICAL Left 12/31/2017   Procedure: LEFT MODIFIED RADICAL MASTECTOMY;  Surgeon: Mavis Anes, MD;  Location: [facility];  Service: General;  Laterality: Left;   PERCUTANEOUS PINNING Left 05/17/2023   Procedure: LEFT HIP PINNING;  Surgeon: Beverley Evalene JONETTA, MD;  Location: [facility];  Service:  Orthopedics;  Laterality: Left;   POLYPECTOMY  09/27/2021   Procedure: POLYPECTOMY;  Surgeon: Eartha Flavors, Toribio, MD;  Location: [facility];  Service: Gastroenterology;;   PORTACATH PLACEMENT Right 06/27/2017   Procedure: INSERTION PORT-A-CATH;  Surgeon: Mavis Anes, MD;  Location: [facility];  Service: General;  Laterality: Right;    Family History  Problem Relation Age of Onset   Breast cancer Mother    Thyroid  disease Mother    Heart disease Mother    Heart attack Father    Heart attack Sister    Hypertension Sister    Heart attack Brother    Cancer Sister     Stroke Brother    Alzheimer's disease Maternal Aunt     Social History   Socioeconomic History   Marital status: Divorced    Spouse name: Not on file   Number of children: 2   Years of education: 10   Highest education level: 10th grade  Occupational History   Occupation: Psychologist, 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  Tobacco Use   Smoking status: Never   Smokeless tobacco: Never  Vaping Use   Vaping status: Never Used  Substance and Sexual Activity   Alcohol use: No   Drug use: No   Sexual activity: Not Currently  Other Topics Concern   Not on file  Social History Narrative   Lives with sister   2 children   Dog: Cozie      Enjoys: puzzles, sewing, and walk      Diet: eats all food groups outside: leafy greens    Caffeine: limited, sweet tea   Water : 6-8 cups daily       No car; does have license, wears seat belt   Smoke detector at home   [place] area    Social Drivers of Health   Tobacco Use: Low Risk (03/18/2024)   Patient History    Smoking Tobacco Use: Never    Smokeless Tobacco Use: Never    Passive Exposure: Not on file  Financial Resource Strain: Low Risk (03/29/2021)   Overall Financial Resource Strain (CARDIA)    Difficulty of Paying Living Expenses: Not hard at all  Food Insecurity: No Food Insecurity (05/17/2023)   Hunger Vital Sign    Worried About Running Out of Food in the Last Year: Never true    Ran Out of Food in the Last Year: Never true  Transportation Needs: No Transportation Needs (05/17/2023)   PRAPARE - Administrator, Civil Service (Medical): No    Lack of Transportation (Non-Medical): No  Physical Activity: Insufficiently Active (03/29/2021)   Exercise Vital Sign    Days of Exercise per Week: 7 days    Minutes of Exercise per Session: 20 min  Stress: No Stress Concern Present (03/29/2021)   [employer] of Occupational Health - Occupational Stress Questionnaire    Feeling of Stress : Not at all  Social Connections: Socially Isolated  (05/17/2023)   Social Connection and Isolation Panel    Frequency of Communication with Friends and Family: More than three times a week    Frequency of Social Gatherings with Friends and Family: Twice a week    Attends Religious Services: Never    Database Administrator or Organizations: No    Attends Banker Meetings: Never    Marital Status: Divorced  Catering Manager Violence: Not At Risk (05/17/2023)   Humiliation, Afraid, Rape, and Kick questionnaire    Fear of Current or Ex-Partner: No    Emotionally Abused:  No    Physically Abused: No    Sexually Abused: No  Depression (PHQ2-9): Low Risk (03/18/2024)   Depression (PHQ2-9)    PHQ-2 Score: 0  Alcohol Screen: Not on file  Housing: Low Risk (05/17/2023)   Housing Stability Vital Sign    Unable to Pay for Housing in the Last Year: No    Number of Times Moved in the Last Year: 0    Homeless in the Last Year: No  Utilities: Not At Risk (05/17/2023)   AHC Utilities    Threatened with loss of utilities: No  Health Literacy: Not on file    Outpatient Medications Prior to Visit  Medication Sig Dispense Refill   anastrozole  (ARIMIDEX ) 1 MG tablet Take 1 tablet (1 mg total) by mouth daily. 90 tablet 1   apixaban  (ELIQUIS ) 5 MG TABS tablet Take 1 tablet (5 mg total) by mouth 2 (two) times daily. 180 tablet 3   atorvastatin  (LIPITOR) 10 MG tablet Take 1 tablet (10 mg total) by mouth at bedtime. 90 tablet 3   blood glucose meter kit and supplies Dispense based on patient and insurance preference. Use up to four times daily as directed. (FOR ICD-10 E10.9, E11.9). 1 each 0   cetirizine (ZYRTEC) 10 MG tablet Take 10 mg by mouth daily.     chlorhexidine  (HIBICLENS ) 4 % external liquid Apply 15 mLs (1 Application total) topically as directed for 30 doses. Use as directed daily for 5 days every other week for 6 weeks. 946 mL 1   Cholecalciferol  (VITAMIN D -3 PO) Take 1 tablet by mouth daily.     empagliflozin  (JARDIANCE ) 10 MG TABS  tablet Take 1 tablet (10 mg total) by mouth daily. 90 tablet 3   feeding supplement (ENSURE ENLIVE / ENSURE PLUS) LIQD Take 237 mLs by mouth 2 (two) times daily between meals.     furosemide  (LASIX ) 40 MG tablet Take 0.5 tablets (20 mg total) by mouth daily as needed (swelling). 30 tablet 3   lactobacillus (FLORANEX/LACTINEX) PACK Take 1 packet by mouth 3 (three) times daily with meals.     magnesium  oxide (MAG-OX) 400 (240 Mg) MG tablet Take 1 tablet (400 mg total) by mouth daily. (Patient taking differently: Take 400 mg by mouth at bedtime.)     metFORMIN  (GLUCOPHAGE -XR) 500 MG 24 hr tablet Take 1 tablet (500 mg total) by mouth daily with breakfast. 90 tablet 3   metoprolol  succinate (TOPROL -XL) 100 MG 24 hr tablet Take 1 tablet (100 mg total) by mouth 2 (two) times daily. Take with or immediately following a meal. 180 tablet 3   mirtazapine  (REMERON ) 7.5 MG tablet Take 1 tablet (7.5 mg total) by mouth at bedtime. 30 tablet 3   mupirocin  ointment (BACTROBAN ) 2 % Apply 1 Application topically 2 (two) times daily.     ondansetron  (ZOFRAN ) 4 MG tablet Take 1 tablet (4 mg total) by mouth every 8 (eight) hours as needed for nausea or vomiting. 20 tablet 0   OVER THE COUNTER MEDICATION Take 1 tablet by mouth at bedtime. OTC stomach pill     traMADol  (ULTRAM ) 50 MG tablet Take 1 tablet (50 mg total) by mouth every 12 (twelve) hours as needed. 30 tablet 1   ACCU-CHEK GUIDE TEST test strip USE 1 STRIP TO CHECK GLUCOSE THREE TIMES DAILY IN THE MORNING ,AT NOON, AT BEDTIME AS DIRECTED 100 each 0   Accu-Chek Softclix Lancets lancets USE AS DIRECTED 100 each 0   No facility-administered medications prior to visit.  No Known Allergies  ROS Review of Systems  Constitutional:  Positive for fatigue. Negative for chills and fever.  HENT:  Negative for congestion, sinus pressure, sinus pain and sore throat.   Eyes:  Negative for pain and discharge.  Respiratory:  Negative for cough and shortness of  breath.   Cardiovascular:  Negative for chest pain and palpitations.  Gastrointestinal:  Positive for nausea. Negative for abdominal pain, diarrhea and vomiting.  Endocrine: Negative for polydipsia and polyuria.  Genitourinary:  Negative for dysuria, frequency and hematuria.  Musculoskeletal:  Positive for arthralgias (Left ankle). Negative for neck pain and neck stiffness.  Skin:  Negative for rash.  Neurological:  Negative for dizziness and weakness.  Psychiatric/Behavioral:  Negative for agitation and behavioral problems.       Objective:    Physical Exam Vitals reviewed.  Constitutional:      General: She is not in acute distress.    Appearance: She is not diaphoretic.     Comments: In wheelchair  HENT:     Head: Normocephalic and atraumatic.     Nose: Nose normal. No congestion.     Mouth/Throat:     Mouth: Mucous membranes are moist.     Pharynx: No posterior oropharyngeal erythema.  Eyes:     General: No scleral icterus.    Extraocular Movements: Extraocular movements intact.  Cardiovascular:     Rate and Rhythm: Normal rate and regular rhythm.     Heart sounds: Normal heart sounds. No murmur heard. Pulmonary:     Breath sounds: Normal breath sounds. No wheezing or rales.  Musculoskeletal:     Cervical back: Neck supple. No tenderness.     Right lower leg: No edema.     Left lower leg: No edema.  Skin:    General: Skin is warm.     Findings: No rash.  Neurological:     General: No focal deficit present.     Mental Status: She is alert and oriented to person, place, and time.     Sensory: No sensory deficit.     Motor: Weakness (B/l LE - 3/5) present.  Psychiatric:        Mood and Affect: Mood normal.        Behavior: Behavior normal.     BP 114/75 (BP Location: Left Arm)   Pulse 89   Ht 5' 4 (1.626 m)   SpO2 98%   BMI 24.17 kg/m  Wt Readings from Last 3 Encounters:  11/20/23 140 lb 12.8 oz (63.9 kg)  09/24/23 135 lb 2.3 oz (61.3 kg)  08/22/23 124  lb (56.2 kg)    Lab Results  Component Value Date   TSH 1.250 08/16/2023   Lab Results  Component Value Date   WBC 8.2 11/20/2023   HGB 11.4 11/20/2023   HCT 34.8 11/20/2023   MCV 93 11/20/2023   PLT 176 11/20/2023   Lab Results  Component Value Date   NA 139 11/20/2023   K 4.1 11/20/2023   CO2 23 11/20/2023   GLUCOSE 113 (H) 11/20/2023   BUN 17 11/20/2023   CREATININE 1.15 (H) 11/20/2023   BILITOT 0.3 11/20/2023   ALKPHOS 87 11/20/2023   AST 24 11/20/2023   ALT 15 11/20/2023   PROT 7.1 11/20/2023   ALBUMIN 3.6 (L) 11/20/2023   CALCIUM  9.5 11/20/2023   ANIONGAP 7 09/14/2023   EGFR 52 (L) 11/20/2023   Lab Results  Component Value Date   CHOL 131 11/20/2023   Lab Results  Component Value Date   HDL 49 11/20/2023   Lab Results  Component Value Date   LDLCALC 67 11/20/2023   Lab Results  Component Value Date   TRIG 74 11/20/2023   Lab Results  Component Value Date   CHOLHDL 2.7 11/20/2023   Lab Results  Component Value Date   HGBA1C 6.2 (H) 11/20/2023      Assessment & Plan:   Problem List Items Addressed This Visit       Cardiovascular and Mediastinum   Atrial fibrillation (HCC) (Chronic)   CHADS-VASc - 4.  On Eliquis  for [facility] On metoprolol  100 mg BID for rate control Followed by cardiology      Chronic diastolic heart failure (HCC) (Chronic)   Appears euvolemic currently Has chronic leg swelling usually, takes furosemide  20 mg every other day On Jardiance  and metoprolol  currently Had to discontinue losartan  due to hypotension      Relevant Orders   B Nat Peptide   Essential hypertension   BP Readings from Last 1 Encounters:  03/18/24 114/75   Well-controlled with metoprolol  100 mg BID DCed Losartan  due to low normal BP and fatigue Counseled for compliance with the medications Advised DASH diet and moderate exercise/walking as tolerated        Endocrine   Type 2 diabetes mellitus with diabetic neuropathy, unspecified (HCC)  (Chronic)   Lab Results  Component Value Date   HGBA1C 6.2 (H) 11/20/2023   Well-controlled Associated with HTN and HLD On Metformin  500 mg QD On Jardiance  10 mg QD for cardiomyopathy Advised to follow diabetic diet On statin F/u CMP, HbA1c and lipid panel Diabetic eye exam: Advised to follow up with Ophthalmology for diabetic eye exam  Mild numbness of LE likely due to diabetic neuropathy, avoid adding gabapentin due to sedative effect for now      Relevant Medications   Blood Glucose Monitoring Suppl DEVI   Glucose Blood (BLOOD GLUCOSE TEST STRIPS) STRP   Lancets MISC   Other Relevant Orders   CMP14+EGFR   Hemoglobin A1c     Genitourinary   Stage 3a chronic kidney disease (HCC)   Last 2 BMPs reviewed, GFR around 50 On Jardiance  for CHF and DM Avoid nephrotoxic agents Maintain adequate hydration      Relevant Orders   CMP14+EGFR   CBC     Other   Dysuria   Has intermittent dysuria Since she is on Jardiance , she is at high risk of UTI Check UA with reflex culture Advised to maintain adequate hydration      Relevant Orders   UA/M w/rflx Culture, Routine   Physical deconditioning   Has generalized weakness and muscle atrophy Has DM neuropathy limiting her ambulation Has had home PT previously Needs to continue light exercises and ambulation at home      Left ankle swelling - Primary   S/p fall on 03/16/24 Has baseline bilateral leg swelling, recently worse after a fall Advised to perform leg elevation as tolerated and apply cool compresses DVT unlikely as she is on Eliquis  currently Advised to take tramadol  as needed for severe pain Check x-ray of left ankle      Relevant Orders   DG Ankle Complete Left   Other Visit Diagnoses       Encounter for immunization       Relevant Orders   Flu vaccine HIGH DOSE PF(Fluzone Trivalent) (Completed)       Meds ordered this encounter  Medications   Blood Glucose Monitoring Suppl DEVI  Sig: 1 each by  Does not apply route as directed. Dispense based on patient and insurance preference. Use up to four times daily as directed. (FOR ICD-10 E10.9, E11.9).    Dispense:  1 each    Refill:  0   Glucose Blood (BLOOD GLUCOSE TEST STRIPS) STRP    Sig: 1 each by Does not apply route as directed. Dispense based on patient and insurance preference. Use up to four times daily as directed. (FOR ICD-10 E10.9, E11.9).    Dispense:  100 strip    Refill:  3   Lancets MISC    Sig: 1 each by Does not apply route as directed. Dispense based on patient and insurance preference. Use up to four times daily as directed. (FOR ICD-10 E10.9, E11.9).    Dispense:  100 each    Refill:  3    Follow-up: Return in about 4 months (around 07/17/2024).    Suzzane MARLA Blanch, MD

## 2024-03-18 NOTE — Patient Instructions (Addendum)
 Please take Tramadol  as needed for severe pain.  Please take Lasix  10 mg for 2 days. Please perform leg elevation as tolerated.

## 2024-03-18 NOTE — Assessment & Plan Note (Signed)
 Lab Results  Component Value Date   HGBA1C 6.2 (H) 11/20/2023   Well-controlled Associated with HTN and HLD On Metformin  500 mg QD On Jardiance  10 mg QD for cardiomyopathy Advised to follow diabetic diet On statin F/u CMP, HbA1c and lipid panel Diabetic eye exam: Advised to follow up with Ophthalmology for diabetic eye exam  Mild numbness of LE likely due to diabetic neuropathy, avoid adding gabapentin due to sedative effect for now

## 2024-03-18 NOTE — Assessment & Plan Note (Signed)
 Last 2 BMPs reviewed, GFR around 50 On Jardiance  for CHF and DM Avoid nephrotoxic agents Maintain adequate hydration

## 2024-03-18 NOTE — Assessment & Plan Note (Addendum)
 S/p fall on 03/16/24 Has baseline bilateral leg swelling, recently worse after a fall Advised to perform leg elevation as tolerated and apply cool compresses DVT unlikely as she is on Eliquis  currently Advised to take tramadol  as needed for severe pain Check x-ray of left ankle

## 2024-03-18 NOTE — Assessment & Plan Note (Signed)
 Has intermittent dysuria Since she is on Jardiance , she is at high risk of UTI Check UA with reflex culture Advised to maintain adequate hydration

## 2024-03-18 NOTE — Telephone Encounter (Signed)
 Appt changed to rpc with dr. Tobie.

## 2024-03-18 NOTE — Assessment & Plan Note (Addendum)
 BP Readings from Last 1 Encounters:  03/18/24 114/75   Well-controlled with metoprolol  100 mg BID DCed Losartan  due to low normal BP and fatigue Counseled for compliance with the medications Advised DASH diet and moderate exercise/walking as tolerated

## 2024-03-18 NOTE — Assessment & Plan Note (Signed)
 Has generalized weakness and muscle atrophy Has DM neuropathy limiting her ambulation Has had home PT previously Needs to continue light exercises and ambulation at home

## 2024-03-18 NOTE — Assessment & Plan Note (Signed)
 Appears euvolemic currently Has chronic leg swelling usually, takes furosemide  20 mg every other day On Jardiance  and metoprolol  currently Had to discontinue losartan  due to hypotension

## 2024-03-18 NOTE — Assessment & Plan Note (Signed)
CHADS-VASc - 4.  On Eliquis for [facility] On metoprolol 100 mg BID for rate control Followed by cardiology

## 2024-03-19 ENCOUNTER — Ambulatory Visit: Payer: Self-pay | Admitting: Internal Medicine

## 2024-03-19 ENCOUNTER — Telehealth: Payer: Self-pay

## 2024-03-19 DIAGNOSIS — L97412 Non-pressure chronic ulcer of right heel and midfoot with fat layer exposed: Secondary | ICD-10-CM

## 2024-03-19 NOTE — Telephone Encounter (Signed)
 Copied from CRM #[id number]. Topic: General - Other >> Mar 19, 2024  8:21 AM Rosaria E wrote: Reason for CRM: Pt was taken for her Xray yesterday however will not be able to come to the office for her urine sample today. Pt's niece called to report this. She states that she can bring the patient tomorrow morning for the urine sample if this would be allowed. Also is willing to take the supplies home and have the patient return it.   Please advise

## 2024-03-19 NOTE — Telephone Encounter (Signed)
 Will come by later this afternoon to pick up the cup.

## 2024-03-20 LAB — BRAIN NATRIURETIC PEPTIDE: BNP: 132.3 pg/mL — AB (ref 0.0–100.0)

## 2024-03-20 LAB — HEMOGLOBIN A1C
Est. average glucose Bld gHb Est-mCnc: 143 mg/dL
Hgb A1c MFr Bld: 6.6 % — ABNORMAL HIGH (ref 4.8–5.6)

## 2024-03-20 LAB — CMP14+EGFR
ALT: 17 IU/L (ref 0–32)
AST: 26 IU/L (ref 0–40)
Albumin: 3.9 g/dL (ref 3.9–4.9)
Alkaline Phosphatase: 91 IU/L (ref 49–135)
BUN/Creatinine Ratio: 19 (ref 12–28)
BUN: 24 mg/dL (ref 8–27)
Bilirubin Total: 0.5 mg/dL (ref 0.0–1.2)
CO2: 25 mmol/L (ref 20–29)
Calcium: 10.1 mg/dL (ref 8.7–10.3)
Chloride: 100 mmol/L (ref 96–106)
Creatinine, Ser: 1.26 mg/dL — ABNORMAL HIGH (ref 0.57–1.00)
Globulin, Total: 3.9 g/dL (ref 1.5–4.5)
Glucose: 137 mg/dL — ABNORMAL HIGH (ref 70–99)
Potassium: 4.1 mmol/L (ref 3.5–5.2)
Sodium: 141 mmol/L (ref 134–144)
Total Protein: 7.8 g/dL (ref 6.0–8.5)
eGFR: 46 mL/min/1.73 — ABNORMAL LOW (ref >59)

## 2024-03-20 LAB — CBC
Hematocrit: 36.4 % (ref 34.0–46.6)
Hemoglobin: 12.3 g/dL (ref 11.1–15.9)
MCH: 30 pg (ref 26.6–33.0)
MCHC: 33.8 g/dL (ref 31.5–35.7)
MCV: 89 fL (ref 79–97)
Platelets: 239 x10E3/uL (ref 150–450)
RBC: 4.1 x10E6/uL (ref 3.77–5.28)
RDW: 13.6 % (ref 11.7–15.4)
WBC: 9.8 x10E3/uL (ref 3.4–10.8)

## 2024-03-20 NOTE — Progress Notes (Deleted)
 Cardiology Office Note:  .   Date:  03/20/2024  Lisa Thornton, DOB 11/21/1955, MRN [id number] PCP: Lisa Suzzane POUR, MD  [facility] Providers Cardiologist:  Diannah SHAUNNA Maywood, MD { History of Present Illness: .   Lisa Thornton is a 68 y.o. female  with PMHx of persistent atrial fibrillation (hospitalized in 07/2022 with atrial fibrillation with RVR in the setting of sepsis and pneumonia but noted on EKG's dating back to 07/2021), HFmrEF (EF 45% by echo in 07/2022 and 11/2022), history of breast cancer, OSA and HLD  who reports to [facility] office for 6 month follow up.   Pertinent cardiac medical history:  Zio: atrial fibrillation 100% of the time with an average heart rate of 94 bpm. Increased Toprol  XL from 150 mg daily to 100 mg BID  Last seen in heartcare 04/27/2023 by Laymon Qua, PA-C for 6 month follow up.  Reported improvement in palpitations with titration of Toprol -XL.  Noted dysuria for the past few days and concern for recurrent UTI.  Referred to PCP office for UA.  Continue metoprolol  XL 100 mg twice daily, Eliquis  5 mg twice daily, Jardiance  10 mg daily, losartan  12.5 mg, Lipitor 10 mg daily.   Recent hospitalization   Today, reports ### and denies ###.  Denies chest pain, shortness of breath, palpitations, syncope, presyncope, dizziness, orthopnea, PND, swelling or significant weight changes, acute bleeding, or claudication.   Reports compliance with medications.  Dietary habitats:  Activity level:  Social: Denies tobacco use/alcohol/drug use  Denies any recent hospitalizations or visits to the emergency department.   Longstanding persistent atrial fibrillation (HCC) Reviewed EKG on or EKG today shows  On exam noted regular, rate and rhythm OR irregularly irregular.  Denies palpitations, dizziness, or chest discomfort. No recurrent dark stools or bleeding. On AC with CBC WNL on #  Continue on   Heart failure with mildly reduced ejection fraction  (HFmrEF) (HCC) ECHO: Denies SOB, edema, orthopnea, or PND. Appears Euvolemic on exam.  Continue on losartan  12.5 mg daily, Jardiance  10 mg daily and Toprol -XL 100 mg twice daily Previously noted soft BPs, therefore losartan  has not been transition to Entresto. Can consider adding spironolactone in the future. If frequent UTIs then we will need to discontinue Jardiance . GDMT limited due to low blood pressure of .SABRA or AKI/CKD Encouraged low sodium diet, fluid restriction <2L, and daily weights.  Educated to contact our office for weight gain of 2 lbs overnight or 5 lbs in one week. ED precautions discussed.    Hyperlipidemia LDL goal <100 Continue Lipitor 10 mg daily Lipid Panel     Component Value Date/Time   CHOL 131 11/20/2023 1329   TRIG 74 11/20/2023 1329   HDL 49 11/20/2023 1329   CHOLHDL 2.7 11/20/2023 1329   LDLCALC 67 11/20/2023 1329   LABVLDL 15 11/20/2023 1329     ROS: 10 point review of system has been reviewed and considered negative except ones been listed in the HPI.   Studies Reviewed: .   Limited Echocardiogram: 11/2022 IMPRESSIONS     1. Limited Echo to evaluate LVEF.   2. Left ventricular ejection fraction, by estimation, is 45%. The left  ventricle has mildly decreased function. The left ventricle has no  regional wall motion abnormalities. Left ventricular diastolic function  could not be evaluated.   3. Right ventricular systolic function is normal. The right ventricular  size is normal. Tricuspid regurgitation signal is inadequate for assessing  PA pressure.  4. The inferior vena cava is normal in size with greater than 50%  respiratory variability, suggesting right atrial pressure of 3 mmHg.   Comparison(s): No significant change from prior study.      Event Monitor: 12/2022   Patch wear time was for 13 days and 23 hours.   100% A-fib burden, ranging from 60 to 194 bpm with an average HR 94 bpm.   No ventricular arrhythmias, AV block or  pauses.   0% PAC burden and <1% PVC burden.   No patient triggered events. Risk Assessment/Calculations:   {Does this patient have ATRIAL FIBRILLATION?:[phone]} No BP recorded.  {Refresh Note OR Click here to enter BP  :1}***   STOP-Bang Score:     { Consider Dx Sleep Disordered Breathing or Sleep Apnea  ICD G47.33          :1}    Physical Exam:   VS:  There were no vitals taken for this visit.   Wt Readings from Last 3 Encounters:  11/20/23 140 lb 12.8 oz (63.9 kg)  09/24/23 135 lb 2.3 oz (61.3 kg)  08/22/23 124 lb (56.2 kg)    GEN: Well nourished, well developed in no acute distress while sitting in chair.  NECK: No JVD; No carotid bruits CARDIAC: ***RRR, no murmurs, rubs, gallops RESPIRATORY:  Clear to auscultation without rales, wheezing or rhonchi  ABDOMEN: Soft, non-tender, non-distended EXTREMITIES:  No edema; No deformity   ASSESSMENT AND PLAN: .   ***    {Are you ordering a CV Procedure (e.g. stress test, cath, DCCV, TEE, etc)?   Press F2        :[id number]}  Dispo: ***  Signed, Lorette CINDERELLA Kapur, PA-C

## 2024-03-21 ENCOUNTER — Telehealth: Payer: Self-pay

## 2024-03-21 NOTE — Telephone Encounter (Signed)
 Copied from CRM #[id number]. Topic: Clinical - Lab/Test Results >> Mar 20, 2024  4:08 PM Shanda MATSU wrote: Reason for CRM: Patient's niece calling in wanting to know if results of the xray done on patient's ankle have came back, is req a call back.

## 2024-03-21 NOTE — Telephone Encounter (Signed)
Have not received results as of yet.

## 2024-03-21 NOTE — Telephone Encounter (Signed)
Pt informed

## 2024-03-24 ENCOUNTER — Ambulatory Visit: Admitting: Physician Assistant

## 2024-03-25 ENCOUNTER — Other Ambulatory Visit: Payer: Self-pay | Admitting: Internal Medicine

## 2024-03-25 DIAGNOSIS — S93402A Sprain of unspecified ligament of left ankle, initial encounter: Secondary | ICD-10-CM

## 2024-03-25 MED ORDER — CEFDINIR 300 MG PO CAPS: 300.0000 mg | ORAL_CAPSULE | Freq: Two times a day (BID) | ORAL | 0 refills | Status: DC

## 2024-03-25 NOTE — Addendum Note (Signed)
 Addended byBETHA TOBIE DOWNS on: 03/25/2024 08:00 AM   Modules accepted: Orders

## 2024-03-26 ENCOUNTER — Telehealth: Payer: Self-pay

## 2024-03-26 ENCOUNTER — Other Ambulatory Visit: Payer: Self-pay | Admitting: Internal Medicine

## 2024-03-26 DIAGNOSIS — N3000 Acute cystitis without hematuria: Secondary | ICD-10-CM

## 2024-03-26 MED ORDER — CEFDINIR 300 MG PO CAPS: 300.0000 mg | ORAL_CAPSULE | Freq: Two times a day (BID) | ORAL | 0 refills | Status: DC

## 2024-03-26 NOTE — Addendum Note (Signed)
 Addended byBETHA TOBIE DOWNS on: 03/26/2024 10:26 AM   Modules accepted: Orders

## 2024-03-26 NOTE — Telephone Encounter (Signed)
 Copied from CRM #[id number]. Topic: Clinical - Prescription Issue >> Mar 26, 2024  8:41 AM Joesph NOVAK wrote: Reason for CRM: patient went to pick up her medication and she dropped it somewhere at the grocery store getting out the car. She would like to know if Dr.Patel would send in another prescription so she can pick it up today.    cefdinir  (OMNICEF ) 300 MG capsule [[id number]]

## 2024-03-26 NOTE — Telephone Encounter (Signed)
 I called pt and verbally informed her about refill and talking to the pharmacy

## 2024-03-29 LAB — UA/M W/RFLX CULTURE, ROUTINE
Bilirubin, UA: NEGATIVE
Ketones, UA: NEGATIVE
Nitrite, UA: POSITIVE — AB
Specific Gravity, UA: 1.025 (ref 1.005–1.030)
Urobilinogen, Ur: 0.2 mg/dL (ref 0.2–1.0)
pH, UA: 5 (ref 5.0–7.5)

## 2024-03-29 LAB — MICROSCOPIC EXAMINATION
Casts: NONE SEEN /LPF
Epithelial Cells (non renal): >10 /HPF — AB (ref 0–10)
RBC, Urine: >30 /HPF — AB (ref 0–2)
WBC, UA: >30 /HPF — AB (ref 0–5)

## 2024-03-29 LAB — URINE CULTURE, REFLEX

## 2024-03-31 ENCOUNTER — Other Ambulatory Visit: Payer: Self-pay | Admitting: Internal Medicine

## 2024-03-31 DIAGNOSIS — N3000 Acute cystitis without hematuria: Secondary | ICD-10-CM

## 2024-03-31 MED ORDER — CEFDINIR 300 MG PO CAPS: 300.0000 mg | ORAL_CAPSULE | Freq: Two times a day (BID) | ORAL | 0 refills | Status: AC

## 2024-04-02 ENCOUNTER — Ambulatory Visit: Admitting: Orthopedic Surgery

## 2024-04-02 ENCOUNTER — Other Ambulatory Visit: Payer: Self-pay

## 2024-04-02 ENCOUNTER — Encounter: Payer: Self-pay | Admitting: Orthopedic Surgery

## 2024-04-02 DIAGNOSIS — M25572 Pain in left ankle and joints of left foot: Secondary | ICD-10-CM

## 2024-04-02 DIAGNOSIS — M7662 Achilles tendinitis, left leg: Secondary | ICD-10-CM

## 2024-04-02 NOTE — Progress Notes (Signed)
 New Patient Visit  Summary: Lisa Thornton is a 68 y.o. female with the following: Left Achilles tendinitis   Assessment & Plan Left Achilles tendon strain and tendinitis Subacute strain and tendinitis.  Radiographs without obvious fracture.  Pain is in the posterior ankle and heel.  Achilles is intact, no rupture or fracture. Symptoms exacerbated by recent trauma. Gradual improvement expected with supportive management. - Provided walking boot for ankle support and strain reduction. - Advised boot use during ambulation and continued walker use for stability. - Recommended continued tramadol  for analgesia. - Discussed over-the-counter topical treatments and ice application for relief. - Scheduled follow-up in three weeks to reassess. - Instructed to contact clinic if symptoms worsen or new concerns arise.     Follow-up: Return in about 3 weeks (around 04/23/2024).  Subjective:  Chief Complaint  Patient presents with   Ankle Pain    L ankle after a fall hitting bar on the side of her bed. Had x-rays done at hospital but just got results Monday.      Discussed the use of AI scribe software for clinical note transcription with the patient, who gave verbal consent to proceed.  History of Present Illness Lisa Thornton is a 68 year old female who presents with left ankle pain and instability following a fall two weeks ago.  Two weeks ago she fell at home when her legs felt weak and gave out, lowering herself to the ground with her left leg caught underneath her. She did not seek emergency care and obtained a left ankle x-ray two days later.  Since the fall she has had persistent posterior left ankle pain radiating up the posterior leg, with diffuse ankle pain, stiffness, and generalized swelling. She denies significant bruising or anterior ankle pain.  Ambulation is markedly limited. She primarily uses a wheelchair and walker and can only walk short distances such as from bed to the  door. Her ankle frequently gives out while walking, leading her to lower herself to the ground to avoid falling. She denies prior ankle injuries but notes chronic ankle stiffness.  For pain she takes tramadol  as prescribed. She has not used ice or topical treatments.    Review of Systems: No fevers or chills No numbness or tingling No chest pain No shortness of breath No bowel or bladder dysfunction No GI distress No headaches   Medical History:  Past Medical History:  Diagnosis Date   Anemia 12/31/2017   Cancer (HCC)    left breast cancer   Chest pain    Diabetes mellitus without complication (HCC)    Hypertension     Past Surgical History:  Procedure Laterality Date   COLONOSCOPY WITH PROPOFOL  N/A 09/27/2021   Procedure: COLONOSCOPY WITH PROPOFOL ;  Surgeon: Eartha Angelia Sieving, MD;  Location: [facility];  Service: Gastroenterology;  Laterality: N/A;  945   MASTECTOMY MODIFIED RADICAL Left 12/31/2017   Procedure: LEFT MODIFIED RADICAL MASTECTOMY;  Surgeon: Mavis Anes, MD;  Location: [facility];  Service: General;  Laterality: Left;   PERCUTANEOUS PINNING Left 05/17/2023   Procedure: LEFT HIP PINNING;  Surgeon: Beverley Evalene JONETTA, MD;  Location: [facility] OR;  Service: Orthopedics;  Laterality: Left;   POLYPECTOMY  09/27/2021   Procedure: POLYPECTOMY;  Surgeon: Eartha Angelia Sieving, MD;  [facility]: [facility];  Service: Gastroenterology;;   PORTACATH PLACEMENT Right 06/27/2017   Procedure: INSERTION PORT-A-CATH;  Surgeon: Mavis Anes, MD;  Location: [facility];  Service: General;  Laterality: Right;    Family  History  Problem Relation Age of Onset   Breast cancer Mother    Thyroid  disease Mother    Heart disease Mother    Heart attack Father    Heart attack Sister    Hypertension Sister    Heart attack Brother    Cancer Sister    Stroke Brother    Alzheimer's disease Maternal Aunt    Social History[1]  Allergies[2]  Active Medications[3]  Objective: There  were no vitals taken for this visit.  Physical Exam:    General: Elderly female., Alert and oriented., No acute distress., and Seated in a wheelchair. Gait: Unable to ambulate.  Physical Exam EXTREMITIES: Mild swelling and limited range of motion in the ankle. Achilles tendon intact.  Tenderness at the insertion of the Achilles.  Limited inversion and eversion.  Supple subtalar motion.  Toes warm and well-perfused.  Onychomycosis of the great toe.  No ecchymosis is appreciated.  No tenderness within the gastrocnemius muscle.   IMAGING: I personally ordered and reviewed the following images  X-rays of the left ankle were obtained in clinic today.  No acute injuries are noted.  These compared to prior x-rays.  She has a Haglund deformity, small bone spur around the insertion of the Achilles tendon.  Shattering of the Achilles tendon demonstrates intact tendon.  No soft tissue swelling is appreciated.  Mortise is congruent.  There is no obvious signs of fracture.  No bony healing.  No callus formation.  Impression: Left ankle x-ray without acute fracture     New Medications:  No orders of the defined types were placed in this encounter.     Portions of this note were completed via Scientist, clinical (histocompatibility and immunogenetics).  Oneil DELENA Horde, MD  04/02/2024 3:55 PM      [1]  Social History Tobacco Use   Smoking status: Never   Smokeless tobacco: Never  Vaping Use   Vaping status: Never Used  Substance Use Topics   Alcohol use: No   Drug use: No  [2] No Known Allergies [3]  Current Meds  Medication Sig   anastrozole  (ARIMIDEX ) 1 MG tablet Take 1 tablet (1 mg total) by mouth daily.   apixaban  (ELIQUIS ) 5 MG TABS tablet Take 1 tablet (5 mg total) by mouth 2 (two) times daily.   atorvastatin  (LIPITOR) 10 MG tablet Take 1 tablet (10 mg total) by mouth at bedtime.   blood glucose meter kit and supplies Dispense based on patient and insurance preference. Use up to four times daily as  directed. (FOR ICD-10 E10.9, E11.9).   Blood Glucose Monitoring Suppl DEVI 1 each by Does not apply route as directed. Dispense based on patient and insurance preference. Use up to four times daily as directed. (FOR ICD-10 E10.9, E11.9).   cefdinir  (OMNICEF ) 300 MG capsule Take 1 capsule (300 mg total) by mouth 2 (two) times daily.   cetirizine (ZYRTEC) 10 MG tablet Take 10 mg by mouth daily.   chlorhexidine  (HIBICLENS ) 4 % external liquid Apply 15 mLs (1 Application total) topically as directed for 30 doses. Use as directed daily for 5 days every other week for 6 weeks.   Cholecalciferol  (VITAMIN D -3 PO) Take 1 tablet by mouth daily.   empagliflozin  (JARDIANCE ) 10 MG TABS tablet Take 1 tablet (10 mg total) by mouth daily.   feeding supplement (ENSURE ENLIVE / ENSURE PLUS) LIQD Take 237 mLs by mouth 2 (two) times daily between meals.   furosemide  (LASIX ) 40 MG tablet Take 0.5 tablets (20 mg  total) by mouth daily as needed (swelling).   Glucose Blood (BLOOD GLUCOSE TEST STRIPS) STRP 1 each by Does not apply route as directed. Dispense based on patient and insurance preference. Use up to four times daily as directed. (FOR ICD-10 E10.9, E11.9).   lactobacillus (FLORANEX/LACTINEX) PACK Take 1 packet by mouth 3 (three) times daily with meals.   Lancets MISC 1 each by Does not apply route as directed. Dispense based on patient and insurance preference. Use up to four times daily as directed. (FOR ICD-10 E10.9, E11.9).   magnesium  oxide (MAG-OX) 400 (240 Mg) MG tablet Take 1 tablet (400 mg total) by mouth daily. (Patient taking differently: Take 400 mg by mouth at bedtime.)   metFORMIN  (GLUCOPHAGE -XR) 500 MG 24 hr tablet Take 1 tablet (500 mg total) by mouth daily with breakfast.   metoprolol  succinate (TOPROL -XL) 100 MG 24 hr tablet Take 1 tablet (100 mg total) by mouth 2 (two) times daily. Take with or immediately following a meal.   mirtazapine  (REMERON ) 7.5 MG tablet Take 1 tablet (7.5 mg total) by mouth  at bedtime.   mupirocin  ointment (BACTROBAN ) 2 % Apply 1 Application topically 2 (two) times daily.   ondansetron  (ZOFRAN ) 4 MG tablet Take 1 tablet (4 mg total) by mouth every 8 (eight) hours as needed for nausea or vomiting.   OVER THE COUNTER MEDICATION Take 1 tablet by mouth at bedtime. OTC stomach pill   traMADol  (ULTRAM ) 50 MG tablet TAKE 1 TABLET BY MOUTH EVERY 12 HOURS AS NEEDED

## 2024-04-16 ENCOUNTER — Ambulatory Visit (INDEPENDENT_AMBULATORY_CARE_PROVIDER_SITE_OTHER)

## 2024-04-16 DIAGNOSIS — Z0000 Encounter for general adult medical examination without abnormal findings: Secondary | ICD-10-CM

## 2024-04-16 NOTE — Progress Notes (Signed)
 "  Chief Complaint  Patient presents with   Medicare Wellness     Subjective:   Lisa Thornton is a 68 y.o. female who presents for a Medicare Annual Wellness Visit.  Visit info / Clinical Intake: Medicare Wellness Visit Type:: Subsequent Annual Wellness Visit Persons participating in visit and providing information:: patient Medicare Wellness Visit Mode:: Telephone If telephone:: video declined Since this visit was completed virtually, some vitals may be partially provided or unavailable. Missing vitals are due to the limitations of the virtual format.: Unable to obtain vitals - no equipment If Telephone or Video please confirm:: I connected with patient using audio/video enable telemedicine. I verified patient identity with two identifiers, discussed telehealth limitations, and patient agreed to proceed. Patient Location:: home Provider Location:: office Interpreter Needed?: No Pre-visit prep was completed: yes AWV questionnaire completed by patient prior to visit?: no Living arrangements:: with family/others (W/ NIECE) Patient's Overall Health Status Rating: very good Typical amount of pain: none Does pain affect daily life?: no Are you currently prescribed opioids?: no  Dietary Habits and Nutritional Risks How many meals a day?: 3 Eats fruit and vegetables daily?: yes Most meals are obtained by: having others provide food In the last 2 weeks, have you had any of the following?: (!) nausea, vomiting, diarrhea (DIARRHEA) Diabetic:: (!) yes Any non-healing wounds?: no How often do you check your BS?: 1 (EVERY DAY) Would you like to be referred to a Nutritionist or for Diabetic Management? : no  Functional Status Activities of Daily Living (to include ambulation/medication): Independent Ambulation: Independent with device- listed below Home Assistive Devices/Equipment: Walker (specify Type) Medication Administration: Independent Home Management (perform basic housework or  laundry): Independent Manage your own finances?: yes Primary transportation is: family / friends Concerns about vision?: no *vision screening is required for [facility]* (WEARS GLASSES & READERS- [facility]) Concerns about hearing?: no  Fall Screening Falls in the past year?: 1 Number of falls in past year: 1 Was there an injury with Fall?: 1 Fall Risk Category Calculator: 3 Patient Fall Risk Level: High Fall Risk  Fall Risk Patient at Risk for Falls Due to: History of fall(s); Orthopedic patient Fall risk Follow up: Falls evaluation completed; Falls prevention discussed  Home and Transportation Safety: All rugs have non-skid backing?: yes All stairs or steps have railings?: (!) no Grab bars in the bathtub or shower?: yes Have non-skid surface in bathtub or shower?: yes Good home lighting?: yes Regular seat belt use?: yes Hospital stays in the last year:: no  Cognitive Assessment Difficulty concentrating, remembering, or making decisions? : no Will 6CIT or Mini Cog be Completed: yes What year is it?: 0 points What month is it?: 0 points Give patient an address phrase to remember (5 components): [address] [place], [place] About what time is it?: 0 points Count backwards from 20 to 1: 0 points Say the months of the year in reverse: 2 points Repeat the address phrase from earlier: 0 points 6 CIT Score: 2 points  Advance Directives (For Healthcare) Does Patient Have a Medical Advance Directive?: No Would patient like information on creating a medical advance directive?: No - Patient declined  Reviewed/Updated  Reviewed/Updated: Reviewed All (Medical, Surgical, Family, Medications, Allergies, Care Teams, Patient Goals)    Allergies (verified) Patient has no known allergies.   Current Medications (verified) Outpatient Encounter Medications as of 04/16/2024  Medication Sig   anastrozole  (ARIMIDEX ) 1 MG tablet Take 1 tablet (1 mg total) by mouth daily.  apixaban  (ELIQUIS ) 5  MG TABS tablet Take 1 tablet (5 mg total) by mouth 2 (two) times daily.   atorvastatin  (LIPITOR) 10 MG tablet Take 1 tablet (10 mg total) by mouth at bedtime.   blood glucose meter kit and supplies Dispense based on patient and insurance preference. Use up to four times daily as directed. (FOR ICD-10 E10.9, E11.9).   Blood Glucose Monitoring Suppl DEVI 1 each by Does not apply route as directed. Dispense based on patient and insurance preference. Use up to four times daily as directed. (FOR ICD-10 E10.9, E11.9).   chlorhexidine  (HIBICLENS ) 4 % external liquid Apply 15 mLs (1 Application total) topically as directed for 30 doses. Use as directed daily for 5 days every other week for 6 weeks.   Cholecalciferol  (VITAMIN D -3 PO) Take 1 tablet by mouth daily.   empagliflozin  (JARDIANCE ) 10 MG TABS tablet Take 1 tablet (10 mg total) by mouth daily.   feeding supplement (ENSURE ENLIVE / ENSURE PLUS) LIQD Take 237 mLs by mouth 2 (two) times daily between meals.   furosemide  (LASIX ) 40 MG tablet Take 0.5 tablets (20 mg total) by mouth daily as needed (swelling).   Glucose Blood (BLOOD GLUCOSE TEST STRIPS) STRP 1 each by Does not apply route as directed. Dispense based on patient and insurance preference. Use up to four times daily as directed. (FOR ICD-10 E10.9, E11.9).   Lancets MISC 1 each by Does not apply route as directed. Dispense based on patient and insurance preference. Use up to four times daily as directed. (FOR ICD-10 E10.9, E11.9).   magnesium  oxide (MAG-OX) 400 (240 Mg) MG tablet Take 1 tablet (400 mg total) by mouth daily.   metFORMIN  (GLUCOPHAGE -XR) 500 MG 24 hr tablet Take 1 tablet (500 mg total) by mouth daily with breakfast.   metoprolol  succinate (TOPROL -XL) 100 MG 24 hr tablet Take 1 tablet (100 mg total) by mouth 2 (two) times daily. Take with or immediately following a meal.   mirtazapine  (REMERON ) 7.5 MG tablet Take 1 tablet (7.5 mg total) by mouth at bedtime.   mupirocin  ointment  (BACTROBAN ) 2 % Apply 1 Application topically 2 (two) times daily.   ondansetron  (ZOFRAN ) 4 MG tablet Take 1 tablet (4 mg total) by mouth every 8 (eight) hours as needed for nausea or vomiting.   traMADol  (ULTRAM ) 50 MG tablet TAKE 1 TABLET BY MOUTH EVERY 12 HOURS AS NEEDED   cefdinir  (OMNICEF ) 300 MG capsule Take 1 capsule (300 mg total) by mouth 2 (two) times daily. (Patient not taking: Reported on 04/16/2024)   cetirizine (ZYRTEC) 10 MG tablet Take 10 mg by mouth daily. (Patient not taking: Reported on 04/16/2024)   lactobacillus (FLORANEX/LACTINEX) PACK Take 1 packet by mouth 3 (three) times daily with meals. (Patient not taking: Reported on 04/16/2024)   OVER THE COUNTER MEDICATION Take 1 tablet by mouth at bedtime. OTC stomach pill   No facility-administered encounter medications on file as of 04/16/2024.    History: Past Medical History:  Diagnosis Date   Anemia 12/31/2017   Cancer (HCC)    left breast cancer   Chest pain    Diabetes mellitus without complication (HCC)    Hypertension    Past Surgical History:  Procedure Laterality Date   COLONOSCOPY WITH PROPOFOL  N/A 09/27/2021   Procedure: COLONOSCOPY WITH PROPOFOL ;  Surgeon: Eartha Angelia Sieving, MD;  Location: [facility];  Service: Gastroenterology;  Laterality: N/A;  945   MASTECTOMY MODIFIED RADICAL Left 12/31/2017   Procedure: LEFT MODIFIED RADICAL  MASTECTOMY;  Surgeon: Mavis Anes, MD;  Location: [facility];  Service: General;  Laterality: Left;   PERCUTANEOUS PINNING Left 05/17/2023   Procedure: LEFT HIP PINNING;  Surgeon: Beverley Evalene BIRCH, MD;  [facility];  Service: Orthopedics;  Laterality: Left;   POLYPECTOMY  09/27/2021   Procedure: POLYPECTOMY;  Surgeon: Eartha Angelia Sieving, MD;  [facility]: [facility];  Service: Gastroenterology;;   PORTACATH PLACEMENT Right 06/27/2017   Procedure: INSERTION PORT-A-CATH;  Surgeon: Mavis Anes, MD;  Location: [facility];  Service: General;  Laterality: Right;   Family  History  Problem Relation Age of Onset   Breast cancer Mother    Thyroid  disease Mother    Heart disease Mother    Heart attack Father    Heart attack Sister    Hypertension Sister    Heart attack Brother    Cancer Sister    Stroke Brother    Alzheimer's disease Maternal Aunt    Social History   Occupational History   Occupation: House Wife  Tobacco Use   Smoking status: Never   Smokeless tobacco: Never  Vaping Use   Vaping status: Never Used  Substance and Sexual Activity   Alcohol use: No   Drug use: No   Sexual activity: Not Currently   Tobacco Counseling Counseling given: Not Answered  SDOH Screenings   Food Insecurity: No Food Insecurity (04/16/2024)  Housing: Unknown (04/16/2024)  Transportation Needs: No Transportation Needs (04/16/2024)  Utilities: Not At Risk (04/16/2024)  Depression (PHQ2-9): Low Risk (04/16/2024)  Physical Activity: Insufficiently Active (04/16/2024)  Social Connections: Moderately Isolated (04/16/2024)  Stress: No Stress Concern Present (04/16/2024)  Tobacco Use: Low Risk (04/16/2024)  Health Literacy: Adequate Health Literacy (04/16/2024)   See flowsheets for full screening details  Depression Screen PHQ 2 & 9 Depression Scale- Over the past 2 weeks, how often have you been bothered by any of the following problems? Little interest or pleasure in doing things: 0 Feeling down, depressed, or hopeless (PHQ Adolescent also includes...irritable): 0 PHQ-2 Total Score: 0 Trouble falling or staying asleep, or sleeping too much: 0 Feeling tired or having little energy: 0 Poor appetite or overeating (PHQ Adolescent also includes...weight loss): 0 Feeling bad about yourself - or that you are a failure or have let yourself or your family down: 0 Trouble concentrating on things, such as reading the newspaper or watching television (PHQ Adolescent also includes...like school work): 0 Moving or speaking so slowly that other people could have noticed. Or the  opposite - being so fidgety or restless that you have been moving around a lot more than usual: 0 Thoughts that you would be better off dead, or of hurting yourself in some way: 0 PHQ-9 Total Score: 0 If you checked off any problems, how difficult have these problems made it for you to do your work, take care of things at home, or get along with other people?: Not difficult at all  Depression Treatment Depression Interventions/Treatment : Counseling     Goals Addressed             This Visit's Progress    DIET - EAT MORE FRUITS AND VEGETABLES               Objective:    There were no vitals filed for this visit. There is no height or weight on file to calculate BMI.  Hearing/Vision screen Hearing Screening - Comments:: NO AIDS Vision Screening - Comments:: WEARS GLASSES &amp; READERS- [facility]- YEARLY Immunizations and Health  Maintenance Health Maintenance  Topic Date Due   COVID-19 Vaccine (1) Never done   DTaP/Tdap/Td (1 - Tdap) Never done   OPHTHALMOLOGY EXAM  02/12/2024   Bone Density Scan  06/25/2024   Diabetic kidney evaluation - Urine ACR  07/10/2024   HEMOGLOBIN A1C  09/16/2024   Mammogram  09/25/2024   Diabetic kidney evaluation - eGFR measurement  03/18/2025   FOOT EXAM  03/18/2025   Medicare Annual Wellness (AWV)  04/16/2025   Colonoscopy  09/27/2028   Pneumococcal Vaccine: 50+ Years  Completed   Influenza Vaccine  Completed   Hepatitis C Screening  Completed   Zoster Vaccines- Shingrix  Completed   Meningococcal B Vaccine  Aged Out        Assessment/Plan:  This is a routine wellness examination for Lisa Thornton.  Patient Care Team: Tobie Suzzane POUR, MD as PCP - General (Internal Medicine) Mallipeddi, Diannah SQUIBB, MD as PCP - Cardiology (Cardiology) Pa, [facility])  I have personally reviewed and noted the following in the patients chart:   Medical and social history Use of alcohol, tobacco or illicit drugs  Current medications  and supplements including opioid prescriptions. Functional ability and status Nutritional status Physical activity Advanced directives List of other physicians Hospitalizations, surgeries, and ER visits in previous 12 months Vitals Screenings to include cognitive, depression, and falls Referrals and appointments  No orders of the defined types were placed in this encounter.  In addition, I have reviewed and discussed with patient certain preventive protocols, quality metrics, and best practice recommendations. A written personalized care plan for preventive services as well as general preventive health recommendations were provided to patient.   Lisa GORMAN Das, LPN   8/85/7973   Return in 1 year (on 04/16/2025).  After Visit Summary: (MyChart) Due to this being a telephonic visit, the after visit summary with patients personalized plan was offered to patient via MyChart   Nurse Notes: NEEDS TDAP; UTD ON MAMMOGRAM, COLONOSCOPY- HAS APPT FOR [facility] 07/07/24   "

## 2024-04-16 NOTE — Patient Instructions (Addendum)
 Ms. Roepke,  Thank you for taking the time for your Medicare Wellness Visit. I appreciate your continued commitment to your health goals. Please review the care plan we discussed, and feel free to reach out if I can assist you further.  Please note that Annual Wellness Visits do not include a physical exam. Some assessments may be limited, especially if the visit was conducted virtually. If needed, we may recommend an in-person follow-up with your provider.  Ongoing Care Seeing your primary care provider every 3 to 6 months helps us  monitor your health and provide consistent, personalized care. 07/14/24 @ 1:00 PM W/ DR.[email or web address]  Referrals If a referral was made during today's visit and you haven't received any updates within two weeks, please contact the referred provider directly to check on the status.  Recommended Screenings:  Health Maintenance  Topic Date Due   COVID-19 Vaccine (1) Never done   DTaP/Tdap/Td vaccine (1 - Tdap) Never done   Eye exam for diabetics  02/12/2024   Osteoporosis screening with Bone Density Scan  06/25/2024   Yearly kidney health urinalysis for diabetes  07/10/2024   Hemoglobin A1C  09/16/2024   Breast Cancer Screening  09/25/2024   Yearly kidney function blood test for diabetes  03/18/2025   Complete foot exam   03/18/2025   Medicare Annual Wellness Visit  04/16/2025   Colon Cancer Screening  09/27/2028   Pneumococcal Vaccine for age over 13  Completed   Flu Shot  Completed   Hepatitis C Screening  Completed   Zoster (Shingles) Vaccine  Completed   Meningitis B Vaccine  Aged Out     Vision: Annual vision screenings are recommended for early detection of glaucoma, cataracts, and diabetic retinopathy. These exams can also reveal signs of chronic conditions such as diabetes and high blood pressure.  Dental: Annual dental screenings help detect early signs of oral cancer, gum disease, and other conditions linked to overall health, including heart disease and  diabetes.  Please see the attached documents for additional preventive care recommendations.   NEXT AWV  04/17/25 @ 10:40 AM IN PERSON

## 2024-04-23 ENCOUNTER — Ambulatory Visit: Admitting: Orthopedic Surgery

## 2024-04-30 ENCOUNTER — Ambulatory Visit: Admitting: Orthopedic Surgery

## 2024-04-30 ENCOUNTER — Ambulatory Visit: Admitting: Physician Assistant

## 2024-05-07 ENCOUNTER — Ambulatory Visit: Admitting: Orthopedic Surgery

## 2024-05-07 ENCOUNTER — Ambulatory Visit: Admitting: Student

## 2024-05-13 ENCOUNTER — Ambulatory Visit: Admitting: Student

## 2024-05-21 ENCOUNTER — Ambulatory Visit: Admitting: Orthopedic Surgery

## 2024-07-07 ENCOUNTER — Other Ambulatory Visit (HOSPITAL_COMMUNITY)

## 2024-07-14 ENCOUNTER — Ambulatory Visit: Payer: Self-pay | Admitting: Internal Medicine

## 2024-09-15 ENCOUNTER — Other Ambulatory Visit

## 2024-09-22 ENCOUNTER — Ambulatory Visit: Admitting: Physician Assistant

## 2025-04-17 ENCOUNTER — Ambulatory Visit: Payer: Self-pay
